# Patient Record
Sex: Female | Born: 1965
Health system: Southern US, Community
[De-identification: ages and names within clinical notes are randomized; demographics above are authoritative.]

## PROBLEM LIST (undated history)

## (undated) DIAGNOSIS — D259 Leiomyoma of uterus, unspecified: Secondary | ICD-10-CM

## (undated) DIAGNOSIS — Z800 Family history of malignant neoplasm of digestive organs: Secondary | ICD-10-CM

## (undated) DIAGNOSIS — F1011 Alcohol abuse, in remission: Secondary | ICD-10-CM

## (undated) DIAGNOSIS — M1990 Unspecified osteoarthritis, unspecified site: Secondary | ICD-10-CM

## (undated) DIAGNOSIS — Z9851 Tubal ligation status: Secondary | ICD-10-CM

## (undated) DIAGNOSIS — T7422XA Child sexual abuse, confirmed, initial encounter: Secondary | ICD-10-CM

## (undated) DIAGNOSIS — K2270 Barrett's esophagus without dysplasia: Secondary | ICD-10-CM

## (undated) DIAGNOSIS — M549 Dorsalgia, unspecified: Secondary | ICD-10-CM

## (undated) DIAGNOSIS — C541 Malignant neoplasm of endometrium: Secondary | ICD-10-CM

## (undated) DIAGNOSIS — I10 Essential (primary) hypertension: Secondary | ICD-10-CM

## (undated) DIAGNOSIS — M2550 Pain in unspecified joint: Secondary | ICD-10-CM

## (undated) DIAGNOSIS — E1140 Type 2 diabetes mellitus with diabetic neuropathy, unspecified: Secondary | ICD-10-CM

## (undated) DIAGNOSIS — F329 Major depressive disorder, single episode, unspecified: Secondary | ICD-10-CM

## (undated) DIAGNOSIS — S5290XA Unspecified fracture of unspecified forearm, initial encounter for closed fracture: Secondary | ICD-10-CM

## (undated) DIAGNOSIS — C801 Malignant (primary) neoplasm, unspecified: Secondary | ICD-10-CM

## (undated) DIAGNOSIS — D126 Benign neoplasm of colon, unspecified: Secondary | ICD-10-CM

## (undated) DIAGNOSIS — Z8042 Family history of malignant neoplasm of prostate: Secondary | ICD-10-CM

## (undated) DIAGNOSIS — Z803 Family history of malignant neoplasm of breast: Secondary | ICD-10-CM

## (undated) DIAGNOSIS — K3184 Gastroparesis: Secondary | ICD-10-CM

## (undated) DIAGNOSIS — F419 Anxiety disorder, unspecified: Secondary | ICD-10-CM

## (undated) DIAGNOSIS — R200 Anesthesia of skin: Secondary | ICD-10-CM

## (undated) DIAGNOSIS — Z87442 Personal history of urinary calculi: Secondary | ICD-10-CM

## (undated) DIAGNOSIS — F1010 Alcohol abuse, uncomplicated: Secondary | ICD-10-CM

## (undated) DIAGNOSIS — G4730 Sleep apnea, unspecified: Secondary | ICD-10-CM

## (undated) DIAGNOSIS — I700 Atherosclerosis of aorta: Secondary | ICD-10-CM

## (undated) DIAGNOSIS — J45909 Unspecified asthma, uncomplicated: Secondary | ICD-10-CM

## (undated) DIAGNOSIS — K760 Fatty (change of) liver, not elsewhere classified: Secondary | ICD-10-CM

## (undated) DIAGNOSIS — F1990 Other psychoactive substance use, unspecified, uncomplicated: Secondary | ICD-10-CM

## (undated) DIAGNOSIS — I517 Cardiomegaly: Secondary | ICD-10-CM

## (undated) DIAGNOSIS — E785 Hyperlipidemia, unspecified: Secondary | ICD-10-CM

## (undated) DIAGNOSIS — F32A Depression, unspecified: Secondary | ICD-10-CM

## (undated) DIAGNOSIS — Z860100 Personal history of colon polyps, unspecified: Secondary | ICD-10-CM

## (undated) DIAGNOSIS — R202 Paresthesia of skin: Secondary | ICD-10-CM

## (undated) DIAGNOSIS — K869 Disease of pancreas, unspecified: Secondary | ICD-10-CM

## (undated) DIAGNOSIS — K449 Diaphragmatic hernia without obstruction or gangrene: Secondary | ICD-10-CM

## (undated) DIAGNOSIS — N950 Postmenopausal bleeding: Secondary | ICD-10-CM

## (undated) DIAGNOSIS — E669 Obesity, unspecified: Secondary | ICD-10-CM

## (undated) DIAGNOSIS — K648 Other hemorrhoids: Secondary | ICD-10-CM

## (undated) DIAGNOSIS — S8292XA Unspecified fracture of left lower leg, initial encounter for closed fracture: Secondary | ICD-10-CM

## (undated) DIAGNOSIS — K219 Gastro-esophageal reflux disease without esophagitis: Secondary | ICD-10-CM

## (undated) DIAGNOSIS — T7840XA Allergy, unspecified, initial encounter: Secondary | ICD-10-CM

## (undated) DIAGNOSIS — A048 Other specified bacterial intestinal infections: Secondary | ICD-10-CM

## (undated) HISTORY — DX: Pain in unspecified joint: M25.50

## (undated) HISTORY — PX: CHOLECYSTECTOMY: SHX55

## (undated) HISTORY — DX: Unspecified osteoarthritis, unspecified site: M19.90

## (undated) HISTORY — DX: Allergy, unspecified, initial encounter: T78.40XA

## (undated) HISTORY — DX: Other psychoactive substance use, unspecified, uncomplicated: F19.90

## (undated) HISTORY — DX: Family history of malignant neoplasm of breast: Z80.3

## (undated) HISTORY — DX: Family history of malignant neoplasm of prostate: Z80.42

## (undated) HISTORY — DX: Benign neoplasm of colon, unspecified: D12.6

## (undated) HISTORY — PX: ABDOMINAL HYSTERECTOMY: SHX81

## (undated) HISTORY — DX: Diaphragmatic hernia without obstruction or gangrene: K44.9

## (undated) HISTORY — DX: Alcohol abuse, uncomplicated: F10.10

## (undated) HISTORY — DX: Dorsalgia, unspecified: M54.9

## (undated) HISTORY — DX: Sleep apnea, unspecified: G47.30

## (undated) HISTORY — DX: Atherosclerosis of aorta: I70.0

## (undated) HISTORY — DX: Malignant (primary) neoplasm, unspecified: C80.1

## (undated) HISTORY — DX: Disease of pancreas, unspecified: K86.9

## (undated) HISTORY — DX: Unspecified asthma, uncomplicated: J45.909

## (undated) HISTORY — DX: Family history of malignant neoplasm of digestive organs: Z80.0

## (undated) HISTORY — DX: Other hemorrhoids: K64.8

## (undated) HISTORY — DX: Fatty (change of) liver, not elsewhere classified: K76.0

## (undated) HISTORY — DX: Essential (primary) hypertension: I10

## (undated) HISTORY — DX: Other specified bacterial intestinal infections: A04.8

## (undated) HISTORY — DX: Obesity, unspecified: E66.9

## (undated) HISTORY — DX: Barrett's esophagus without dysplasia: K22.70

## (undated) HISTORY — DX: Tubal ligation status: Z98.51

## (undated) HISTORY — DX: Hyperlipidemia, unspecified: E78.5

## (undated) HISTORY — PX: TUBAL LIGATION: SHX77

---
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
## 1997-07-06 ENCOUNTER — Emergency Department (HOSPITAL_COMMUNITY): Admission: EM | Admit: 1997-07-06 | Discharge: 1997-07-06 | Payer: Self-pay | Admitting: Emergency Medicine

## 1997-07-28 ENCOUNTER — Emergency Department (HOSPITAL_COMMUNITY): Admission: EM | Admit: 1997-07-28 | Discharge: 1997-07-28 | Payer: Self-pay | Admitting: Emergency Medicine

## 1997-11-24 ENCOUNTER — Encounter: Payer: Self-pay | Admitting: Emergency Medicine

## 1997-11-24 ENCOUNTER — Emergency Department (HOSPITAL_COMMUNITY): Admission: EM | Admit: 1997-11-24 | Discharge: 1997-11-24 | Payer: Self-pay | Admitting: Emergency Medicine

## 1998-06-04 ENCOUNTER — Other Ambulatory Visit: Admission: RE | Admit: 1998-06-04 | Discharge: 1998-06-04 | Payer: Self-pay | Admitting: Family Medicine

## 1998-08-02 ENCOUNTER — Emergency Department (HOSPITAL_COMMUNITY): Admission: EM | Admit: 1998-08-02 | Discharge: 1998-08-02 | Payer: Self-pay | Admitting: Emergency Medicine

## 1998-08-03 ENCOUNTER — Encounter: Admission: RE | Admit: 1998-08-03 | Discharge: 1998-08-03 | Payer: Self-pay | Admitting: Obstetrics

## 1998-08-03 ENCOUNTER — Other Ambulatory Visit: Admission: RE | Admit: 1998-08-03 | Discharge: 1998-08-03 | Payer: Self-pay | Admitting: Obstetrics

## 1998-08-11 ENCOUNTER — Ambulatory Visit (HOSPITAL_COMMUNITY): Admission: RE | Admit: 1998-08-11 | Discharge: 1998-08-11 | Payer: Self-pay

## 1998-10-05 ENCOUNTER — Other Ambulatory Visit: Admission: RE | Admit: 1998-10-05 | Discharge: 1998-10-05 | Payer: Self-pay | Admitting: Obstetrics & Gynecology

## 1998-10-05 ENCOUNTER — Encounter: Admission: RE | Admit: 1998-10-05 | Discharge: 1998-10-05 | Payer: Self-pay | Admitting: Obstetrics & Gynecology

## 1999-01-08 ENCOUNTER — Emergency Department (HOSPITAL_COMMUNITY): Admission: EM | Admit: 1999-01-08 | Discharge: 1999-01-08 | Payer: Self-pay | Admitting: Emergency Medicine

## 1999-01-08 ENCOUNTER — Encounter: Payer: Self-pay | Admitting: Emergency Medicine

## 1999-03-01 ENCOUNTER — Encounter: Payer: Self-pay | Admitting: Emergency Medicine

## 1999-03-01 ENCOUNTER — Emergency Department (HOSPITAL_COMMUNITY): Admission: EM | Admit: 1999-03-01 | Discharge: 1999-03-01 | Payer: Self-pay | Admitting: Emergency Medicine

## 1999-03-15 ENCOUNTER — Emergency Department (HOSPITAL_COMMUNITY): Admission: EM | Admit: 1999-03-15 | Discharge: 1999-03-15 | Payer: Self-pay | Admitting: Emergency Medicine

## 1999-06-28 ENCOUNTER — Inpatient Hospital Stay (HOSPITAL_COMMUNITY): Admission: EM | Admit: 1999-06-28 | Discharge: 1999-07-01 | Payer: Self-pay | Admitting: *Deleted

## 1999-07-15 ENCOUNTER — Encounter: Payer: Self-pay | Admitting: *Deleted

## 1999-07-15 ENCOUNTER — Inpatient Hospital Stay (HOSPITAL_COMMUNITY): Admission: EM | Admit: 1999-07-15 | Discharge: 1999-07-16 | Payer: Self-pay | Admitting: Emergency Medicine

## 1999-07-16 ENCOUNTER — Inpatient Hospital Stay (HOSPITAL_COMMUNITY): Admission: EM | Admit: 1999-07-16 | Discharge: 1999-07-19 | Payer: Self-pay | Admitting: Psychiatry

## 1999-08-11 ENCOUNTER — Inpatient Hospital Stay (HOSPITAL_COMMUNITY): Admission: EM | Admit: 1999-08-11 | Discharge: 1999-08-15 | Payer: Self-pay | Admitting: Psychiatry

## 1999-11-18 ENCOUNTER — Encounter: Admission: RE | Admit: 1999-11-18 | Discharge: 2000-02-16 | Payer: Self-pay

## 1999-12-02 ENCOUNTER — Emergency Department (HOSPITAL_COMMUNITY): Admission: EM | Admit: 1999-12-02 | Discharge: 1999-12-03 | Payer: Self-pay | Admitting: Emergency Medicine

## 1999-12-03 ENCOUNTER — Encounter: Payer: Self-pay | Admitting: Emergency Medicine

## 2000-03-07 ENCOUNTER — Encounter: Admission: RE | Admit: 2000-03-07 | Discharge: 2000-06-05 | Payer: Self-pay

## 2000-03-30 ENCOUNTER — Emergency Department (HOSPITAL_COMMUNITY): Admission: EM | Admit: 2000-03-30 | Discharge: 2000-03-30 | Payer: Self-pay | Admitting: Emergency Medicine

## 2000-05-03 ENCOUNTER — Emergency Department (HOSPITAL_COMMUNITY): Admission: EM | Admit: 2000-05-03 | Discharge: 2000-05-03 | Payer: Self-pay | Admitting: Emergency Medicine

## 2000-05-13 ENCOUNTER — Emergency Department (HOSPITAL_COMMUNITY): Admission: EM | Admit: 2000-05-13 | Discharge: 2000-05-14 | Payer: Self-pay | Admitting: Emergency Medicine

## 2000-07-05 ENCOUNTER — Other Ambulatory Visit: Admission: RE | Admit: 2000-07-05 | Discharge: 2000-07-05 | Payer: Self-pay | Admitting: Obstetrics

## 2000-07-05 ENCOUNTER — Encounter: Admission: RE | Admit: 2000-07-05 | Discharge: 2000-07-05 | Payer: Self-pay | Admitting: Obstetrics

## 2000-07-19 ENCOUNTER — Encounter: Admission: RE | Admit: 2000-07-19 | Discharge: 2000-07-19 | Payer: Self-pay | Admitting: Obstetrics & Gynecology

## 2000-09-11 ENCOUNTER — Emergency Department (HOSPITAL_COMMUNITY): Admission: EM | Admit: 2000-09-11 | Discharge: 2000-09-11 | Payer: Self-pay | Admitting: Emergency Medicine

## 2001-05-20 ENCOUNTER — Ambulatory Visit (HOSPITAL_COMMUNITY): Admission: RE | Admit: 2001-05-20 | Discharge: 2001-05-20 | Payer: Self-pay | Admitting: Internal Medicine

## 2001-05-20 ENCOUNTER — Encounter: Payer: Self-pay | Admitting: Internal Medicine

## 2001-06-03 ENCOUNTER — Encounter: Payer: Self-pay | Admitting: General Surgery

## 2001-06-04 ENCOUNTER — Encounter: Payer: Self-pay | Admitting: General Surgery

## 2001-06-04 ENCOUNTER — Ambulatory Visit (HOSPITAL_COMMUNITY): Admission: RE | Admit: 2001-06-04 | Discharge: 2001-06-05 | Payer: Self-pay | Admitting: General Surgery

## 2001-06-04 ENCOUNTER — Encounter (INDEPENDENT_AMBULATORY_CARE_PROVIDER_SITE_OTHER): Payer: Self-pay | Admitting: *Deleted

## 2002-04-24 ENCOUNTER — Ambulatory Visit (HOSPITAL_COMMUNITY): Admission: RE | Admit: 2002-04-24 | Discharge: 2002-04-24 | Payer: Self-pay | Admitting: Internal Medicine

## 2002-04-24 ENCOUNTER — Encounter: Payer: Self-pay | Admitting: Internal Medicine

## 2002-05-08 ENCOUNTER — Emergency Department (HOSPITAL_COMMUNITY): Admission: EM | Admit: 2002-05-08 | Discharge: 2002-05-08 | Payer: Self-pay | Admitting: Emergency Medicine

## 2002-05-26 ENCOUNTER — Emergency Department (HOSPITAL_COMMUNITY): Admission: EM | Admit: 2002-05-26 | Discharge: 2002-05-26 | Payer: Self-pay

## 2002-08-21 ENCOUNTER — Emergency Department (HOSPITAL_COMMUNITY): Admission: EM | Admit: 2002-08-21 | Discharge: 2002-08-21 | Payer: Self-pay | Admitting: Emergency Medicine

## 2002-08-21 ENCOUNTER — Encounter: Payer: Self-pay | Admitting: Emergency Medicine

## 2002-08-25 ENCOUNTER — Emergency Department (HOSPITAL_COMMUNITY): Admission: EM | Admit: 2002-08-25 | Discharge: 2002-08-25 | Payer: Self-pay | Admitting: *Deleted

## 2002-10-19 ENCOUNTER — Emergency Department (HOSPITAL_COMMUNITY): Admission: EM | Admit: 2002-10-19 | Discharge: 2002-10-19 | Payer: Self-pay

## 2002-10-30 ENCOUNTER — Emergency Department (HOSPITAL_COMMUNITY): Admission: EM | Admit: 2002-10-30 | Discharge: 2002-10-30 | Payer: Self-pay | Admitting: Emergency Medicine

## 2002-11-06 ENCOUNTER — Emergency Department (HOSPITAL_COMMUNITY): Admission: AD | Admit: 2002-11-06 | Discharge: 2002-11-06 | Payer: Self-pay | Admitting: Family Medicine

## 2002-12-19 ENCOUNTER — Emergency Department (HOSPITAL_COMMUNITY): Admission: EM | Admit: 2002-12-19 | Discharge: 2002-12-19 | Payer: Self-pay | Admitting: Emergency Medicine

## 2003-01-10 ENCOUNTER — Emergency Department (HOSPITAL_COMMUNITY): Admission: EM | Admit: 2003-01-10 | Discharge: 2003-01-10 | Payer: Self-pay

## 2003-02-26 ENCOUNTER — Emergency Department (HOSPITAL_COMMUNITY): Admission: EM | Admit: 2003-02-26 | Discharge: 2003-02-26 | Payer: Self-pay | Admitting: Family Medicine

## 2003-03-11 ENCOUNTER — Emergency Department (HOSPITAL_COMMUNITY): Admission: AD | Admit: 2003-03-11 | Discharge: 2003-03-11 | Payer: Self-pay | Admitting: Family Medicine

## 2003-03-29 ENCOUNTER — Inpatient Hospital Stay (HOSPITAL_COMMUNITY): Admission: EM | Admit: 2003-03-29 | Discharge: 2003-03-31 | Payer: Self-pay

## 2003-03-31 ENCOUNTER — Inpatient Hospital Stay (HOSPITAL_COMMUNITY): Admission: RE | Admit: 2003-03-31 | Discharge: 2003-04-04 | Payer: Self-pay | Admitting: Psychiatry

## 2003-09-15 ENCOUNTER — Ambulatory Visit (HOSPITAL_COMMUNITY): Admission: RE | Admit: 2003-09-15 | Discharge: 2003-09-15 | Payer: Self-pay | Admitting: Internal Medicine

## 2003-09-15 ENCOUNTER — Ambulatory Visit: Payer: Self-pay | Admitting: Family Medicine

## 2003-11-13 ENCOUNTER — Emergency Department (HOSPITAL_COMMUNITY): Admission: EM | Admit: 2003-11-13 | Discharge: 2003-11-13 | Payer: Self-pay | Admitting: Emergency Medicine

## 2004-01-02 ENCOUNTER — Emergency Department (HOSPITAL_COMMUNITY): Admission: EM | Admit: 2004-01-02 | Discharge: 2004-01-03 | Payer: Self-pay | Admitting: Emergency Medicine

## 2004-03-08 ENCOUNTER — Ambulatory Visit: Payer: Self-pay | Admitting: Nurse Practitioner

## 2004-04-18 ENCOUNTER — Ambulatory Visit: Payer: Self-pay | Admitting: Family Medicine

## 2004-04-25 ENCOUNTER — Ambulatory Visit: Payer: Self-pay | Admitting: Family Medicine

## 2004-05-16 ENCOUNTER — Ambulatory Visit: Payer: Self-pay | Admitting: Family Medicine

## 2004-05-25 ENCOUNTER — Ambulatory Visit: Payer: Self-pay | Admitting: Family Medicine

## 2004-08-02 ENCOUNTER — Ambulatory Visit: Payer: Self-pay | Admitting: Family Medicine

## 2004-08-31 ENCOUNTER — Ambulatory Visit: Payer: Self-pay | Admitting: Family Medicine

## 2004-08-31 ENCOUNTER — Other Ambulatory Visit: Admission: RE | Admit: 2004-08-31 | Discharge: 2004-08-31 | Payer: Self-pay | Admitting: Internal Medicine

## 2004-10-17 ENCOUNTER — Ambulatory Visit: Payer: Self-pay | Admitting: Family Medicine

## 2004-10-29 ENCOUNTER — Emergency Department (HOSPITAL_COMMUNITY): Admission: EM | Admit: 2004-10-29 | Discharge: 2004-10-29 | Payer: Self-pay | Admitting: Family Medicine

## 2004-11-10 ENCOUNTER — Ambulatory Visit: Payer: Self-pay | Admitting: Family Medicine

## 2004-12-29 ENCOUNTER — Ambulatory Visit: Payer: Self-pay | Admitting: Family Medicine

## 2005-01-24 ENCOUNTER — Ambulatory Visit: Payer: Self-pay | Admitting: Internal Medicine

## 2005-01-30 ENCOUNTER — Ambulatory Visit: Payer: Self-pay | Admitting: Family Medicine

## 2006-01-10 ENCOUNTER — Emergency Department (HOSPITAL_COMMUNITY): Admission: EM | Admit: 2006-01-10 | Discharge: 2006-01-10 | Payer: Self-pay | Admitting: Family Medicine

## 2006-06-08 ENCOUNTER — Ambulatory Visit (HOSPITAL_COMMUNITY): Admission: RE | Admit: 2006-06-08 | Discharge: 2006-06-08 | Payer: Self-pay | Admitting: Family Medicine

## 2006-06-21 ENCOUNTER — Emergency Department (HOSPITAL_COMMUNITY): Admission: EM | Admit: 2006-06-21 | Discharge: 2006-06-21 | Payer: Self-pay | Admitting: Emergency Medicine

## 2006-06-25 ENCOUNTER — Emergency Department (HOSPITAL_COMMUNITY): Admission: EM | Admit: 2006-06-25 | Discharge: 2006-06-25 | Payer: Self-pay | Admitting: Emergency Medicine

## 2006-07-11 ENCOUNTER — Ambulatory Visit: Payer: Self-pay | Admitting: Obstetrics & Gynecology

## 2006-07-11 ENCOUNTER — Encounter: Payer: Self-pay | Admitting: Obstetrics & Gynecology

## 2006-07-17 ENCOUNTER — Emergency Department (HOSPITAL_COMMUNITY): Admission: EM | Admit: 2006-07-17 | Discharge: 2006-07-18 | Payer: Self-pay | Admitting: Emergency Medicine

## 2006-09-05 ENCOUNTER — Emergency Department (HOSPITAL_COMMUNITY): Admission: EM | Admit: 2006-09-05 | Discharge: 2006-09-05 | Payer: Self-pay | Admitting: Emergency Medicine

## 2006-12-11 ENCOUNTER — Emergency Department (HOSPITAL_COMMUNITY): Admission: EM | Admit: 2006-12-11 | Discharge: 2006-12-11 | Payer: Self-pay | Admitting: Emergency Medicine

## 2006-12-19 ENCOUNTER — Emergency Department (HOSPITAL_COMMUNITY): Admission: EM | Admit: 2006-12-19 | Discharge: 2006-12-19 | Payer: Self-pay | Admitting: Family Medicine

## 2007-02-09 ENCOUNTER — Emergency Department (HOSPITAL_COMMUNITY): Admission: EM | Admit: 2007-02-09 | Discharge: 2007-02-09 | Payer: Self-pay | Admitting: Emergency Medicine

## 2007-05-20 ENCOUNTER — Ambulatory Visit: Payer: Self-pay | Admitting: Internal Medicine

## 2007-05-29 ENCOUNTER — Emergency Department (HOSPITAL_COMMUNITY): Admission: EM | Admit: 2007-05-29 | Discharge: 2007-05-29 | Payer: Self-pay | Admitting: Family Medicine

## 2007-08-19 ENCOUNTER — Emergency Department (HOSPITAL_COMMUNITY): Admission: EM | Admit: 2007-08-19 | Discharge: 2007-08-19 | Payer: Self-pay | Admitting: Emergency Medicine

## 2008-07-02 ENCOUNTER — Emergency Department (HOSPITAL_COMMUNITY): Admission: EM | Admit: 2008-07-02 | Discharge: 2008-07-02 | Payer: Self-pay | Admitting: Emergency Medicine

## 2008-08-01 ENCOUNTER — Emergency Department (HOSPITAL_COMMUNITY): Admission: EM | Admit: 2008-08-01 | Discharge: 2008-08-01 | Payer: Self-pay | Admitting: Emergency Medicine

## 2008-08-09 ENCOUNTER — Emergency Department (HOSPITAL_COMMUNITY): Admission: EM | Admit: 2008-08-09 | Discharge: 2008-08-09 | Payer: Self-pay | Admitting: Emergency Medicine

## 2009-08-19 ENCOUNTER — Ambulatory Visit (HOSPITAL_COMMUNITY): Admission: RE | Admit: 2009-08-19 | Discharge: 2009-08-19 | Payer: Self-pay | Admitting: Internal Medicine

## 2009-08-19 ENCOUNTER — Emergency Department (HOSPITAL_COMMUNITY): Admission: EM | Admit: 2009-08-19 | Discharge: 2009-08-20 | Payer: Self-pay | Admitting: Emergency Medicine

## 2009-10-02 ENCOUNTER — Emergency Department (HOSPITAL_COMMUNITY): Admission: EM | Admit: 2009-10-02 | Discharge: 2009-10-02 | Payer: Self-pay | Admitting: Family Medicine

## 2010-01-31 ENCOUNTER — Encounter: Payer: Self-pay | Admitting: Internal Medicine

## 2010-03-24 LAB — POCT RAPID STREP A (OFFICE): Streptococcus, Group A Screen (Direct): POSITIVE — AB

## 2010-04-18 LAB — CARBOXYHEMOGLOBIN
Carboxyhemoglobin: 1.3 % (ref 0.5–1.5)
O2 Saturation: 63.4 %
Total hemoglobin: 10.3 g/dL — ABNORMAL LOW (ref 12.5–16.0)

## 2010-05-09 ENCOUNTER — Emergency Department (HOSPITAL_COMMUNITY)
Admission: EM | Admit: 2010-05-09 | Discharge: 2010-05-10 | Disposition: A | Discharge status: Home / Self Care | Payer: Medicaid Other | Attending: Emergency Medicine | Admitting: Emergency Medicine

## 2010-05-09 DIAGNOSIS — Z79899 Other long term (current) drug therapy: Secondary | ICD-10-CM | POA: Insufficient documentation

## 2010-05-09 DIAGNOSIS — E669 Obesity, unspecified: Secondary | ICD-10-CM | POA: Insufficient documentation

## 2010-05-09 DIAGNOSIS — J45909 Unspecified asthma, uncomplicated: Secondary | ICD-10-CM | POA: Insufficient documentation

## 2010-05-09 DIAGNOSIS — J039 Acute tonsillitis, unspecified: Secondary | ICD-10-CM | POA: Insufficient documentation

## 2010-05-09 DIAGNOSIS — J351 Hypertrophy of tonsils: Secondary | ICD-10-CM | POA: Insufficient documentation

## 2010-05-09 DIAGNOSIS — F329 Major depressive disorder, single episode, unspecified: Secondary | ICD-10-CM | POA: Insufficient documentation

## 2010-05-09 DIAGNOSIS — F3289 Other specified depressive episodes: Secondary | ICD-10-CM | POA: Insufficient documentation

## 2010-05-09 DIAGNOSIS — R070 Pain in throat: Secondary | ICD-10-CM | POA: Insufficient documentation

## 2010-05-09 DIAGNOSIS — M1990 Unspecified osteoarthritis, unspecified site: Secondary | ICD-10-CM | POA: Insufficient documentation

## 2010-05-09 LAB — POCT I-STAT, CHEM 8
BUN: 5 mg/dL — ABNORMAL LOW (ref 6–23)
Calcium, Ion: 1.05 mmol/L — ABNORMAL LOW (ref 1.12–1.32)
Chloride: 102 mEq/L (ref 96–112)
Creatinine, Ser: 1 mg/dL (ref 0.4–1.2)
Glucose, Bld: 126 mg/dL — ABNORMAL HIGH (ref 70–99)
HCT: 39 % (ref 36.0–46.0)
Hemoglobin: 13.3 g/dL (ref 12.0–15.0)
Potassium: 3.4 meq/L — ABNORMAL LOW (ref 3.5–5.1)
Sodium: 138 meq/L (ref 135–145)
TCO2: 27 mmol/L (ref 0–100)

## 2010-05-10 LAB — URINALYSIS, ROUTINE W REFLEX MICROSCOPIC
Bilirubin Urine: NEGATIVE
Glucose, UA: NEGATIVE mg/dL
Ketones, ur: NEGATIVE mg/dL
Leukocytes, UA: NEGATIVE
Nitrite: NEGATIVE
Protein, ur: NEGATIVE mg/dL
Specific Gravity, Urine: 1.012 (ref 1.005–1.030)
Urobilinogen, UA: 1 mg/dL (ref 0.0–1.0)
pH: 7.5 (ref 5.0–8.0)

## 2010-05-10 LAB — URINE MICROSCOPIC-ADD ON
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
## 2010-05-27 NOTE — Op Note (Signed)
[place]  Patient:    Tara, Keller Visit Number: [id number] MRN: [id number]          Service Type: DSU Location: [phone] Attending Physician:  Henrene Dodge Dictated by:   Anselm Pancoast. Zachery Dakins, M.D. Proc. Date: 06/04/01 Admit Date:  06/04/2001 Discharge Date: 06/05/2001                             Operative Report  PREOPERATIVE DIAGNOSES: 1. Chronic cholecystitis with stones. 2. Obesity.  POSTOPERATIVE DIAGNOSIS:  Chronic cholecystitis with multiple stones.  OPERATION PERFORMED:  Cholecystectomy with cholangiogram.  SURGEON:  Anselm Pancoast. Zachery Dakins, M.D.  ASSISTANT:  Gita Kudo, M.D.  ANESTHESIA:  General.  INDICATIONS FOR PROCEDURE:  The patient is a 45 year old, very heavy female whom I saw in the office on Friday.  For about a month, she has had recurring episodes of upper abdominal pain.  She had an ultrasound that showed multiple stones.  She had been seen several times for this pain, was not acutely ill or febrile and I added her to the operating room schedule for today.  She said had two or three little episodes of pain over the weekend but no nausea, vomiting or problems.  DESCRIPTION OF PROCEDURE:  Preoperatively, she had PAS stockings.  She was positioned on the operating room table.  Induction of general anesthesia, endotracheal tube.  Her abdomen was very large and was prepped with Betadine surgical scrub and solution and draped in a sterile manner.  A small incision was made below the umbilicus.  Appendiceal retractors were used to identify the fascia.  This was picked up between two Kochers and a small opening was made. I then carefully opened into the peritoneal cavity.  Traction sutures of 0 Vicryl were placed superiorly and inferiorly and the Hasson camera introduced.  A camera was inserted.  Carbon dioxide turned on and the upper 10 mm trocar was placed in the subxiphoid area under direct  vision after anesthetizing the fascia.  Dr. Maryagnes Amos placed the two lateral 5 mm ports.  The gallbladder was significantly thickened and packed with stones and not acutely inflamed.  There was a stone impacted in the proximal portion and the traction upward and lateral, we could fortunately identify the cystic duct area nicely. The cystic artery the anterior branch was doubly clipped proximally, singly distally and divided and this sort of opened the view to the junction of the gallbladder and the cystic duct.  This was encompassed with a right angle and a clip was placed at the junction and then a small opening made in the cystic duct.  A Cook catheter introduced and the x-ray obtained.  Good prompt filling of the various normal small sized extrahepatic biliary tree and good flow into the duodenum.  The catheter was removed.  The cystic duct was triply clipped and then divided.  There was a posterior branch of the cystic artery that was identified and this was doubly clipped and then we freed the gallbladder from its bed using the hook and spatula with electrocautery.  Good hemostasis was obtained.  It was necessary to place a clip more lateral from a bleeder right at the edge of the peritoneal edge of the gallbladder bed.  The gallbladder was then placed in an Endocatch bag and the camera withdrawn and placed through the upper 10 mm port.  She had a little bit of a hernia  at the umbilicus so omentum caught there and Hasson cannula had kind of gone through it.  We peeled this away carefully. There was a little area that possibly would have bled and we were able to visualize this and hemoclip it and then brought the bag containing the gallbladder up to the fascia.  We opened the fascia slightly, then could bring the gallbladder neck into the skin level, open the bag and the gallbladder and remove the numerous stones, we could then bring it on through the fascial defect without enlarging it  further.   I then closed the fascial defect with two figure-of-eights of 0 Vicryl plus the stitches already in place and these were all tied and the fascia anesthetized.  Reinspection, the irrigating fluid had been aspirated.  There was no evidence of any bleeding.  Looking down at the umbilicus it looks like we got a good closure with no bleeding and we then released the carbon dioxide removing the 5 mm ports.  I then withdrew the upper 10 mm trocar under direct vision.  The subcutaneous wounds closed with 4-0 Vicryl and benzoin and Steri-Strips on the skin.  The patient tolerated the procedure nicely and was taken to the recovery room extubated in stable postoperative condition.  She should be ready for discharge in the morning. Dictated by:   Anselm Pancoast. Zachery Dakins, M.D. Attending Physician:  Henrene Dodge DD:  06/04/01 TD:  06/05/01 Job: [id number]
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
## 2010-05-27 NOTE — H&P (Signed)
[place]  Patient:    Tara Keller, Tara Keller                       MRN: [id number] Attending:  Barbette Hair. Vaughan Basta., M.D.                         History and Physical  DATE OF BIRTH: [birth year]  CHIEF COMPLAINT: The patient attempted to commit suicide.  HISTORY OF PRESENT ILLNESS: Tara Keller is a 45 year old black female who has a history of major depression, followed at mental health, and also has a history of asthma, followed at [facility].  Apparently this evening after having an argument with her daughter she took approximately #10 Ambien as best we can determine.  It does not appear she took any other medication.  The patient currently is not awake in order to help [name] answer these questions.  Drug screen is currently pending.  CURRENT MEDICATIONS:  1. Prozac, dose unknown.  2. Neurontin, dose unknown.  3. BuSpar, dose unknown.  4. Halcion, dose unknown.  Her medications were not brought with her.  ALLERGIES: No known drug allergies.  SOCIAL HISTORY: The patient is single and has four children.  She is disabled. She smokes half a pack of cigarettes per day and does not drink alcohol. However, on a recent visit to the emergency department in June 2001 cocaine was positive on her drug screen.  FAMILY HISTORY: Unobtainable at present.  PHYSICAL EXAMINATION:  VITAL SIGNS: Blood pressure 94/42, pulse 99, respiratory rate less than 20. She is afebrile.  LUNGS: Clear to auscultation bilaterally.  HEART: Regular rate and rhythm without murmurs, rubs, or gallops.  ABDOMEN: Soft, nontender, nondistended.  Positive bowel sounds.  No hepatosplenomegaly.  EXTREMITIES: No clubbing, cyanosis, or edema.  LABORATORY DATA: CBC showed a WBC of 5.7, hemoglobin 10.8, hematocrit 33.8; platelet count 332,000.  Basic metabolic panel shows a sodium of 133, potassium 2.8, chloride 104, CO2 25, BUN 13, creatinine 0.7, glucose 100. Drug screen  pending.  Tylenol and aspirin levels negative.  ASSESSMENT/PLAN:  1. Suicidal attempt with Halcion overdose, approximately #10 pills.  She     needs monitoring overnight with observation.  She will need psychiatric     clearance prior to discharge home.  2. Depression.  Hold current medications at present due to somnolence.  3. Probable obstructive sleep apnea based on her sedated evaluation at     present.  She probably will need outpatient evaluation for that.  4. Hypokalemia.  Will replete and recheck in the morning.  5. Polysubstance abuse.  Drug screen is still pending.  6. Asthma.  Will cover her with albuterol and Atrovent nebulizer inhalers. DD:  07/16/99 TD:  07/18/99 Job: [id number]
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
## 2010-05-27 NOTE — Discharge Summary (Signed)
[facility]  Patient:    Tara Keller, Tara Keller                        MRN: [id number] Adm. Date:  [id number] Disc. Date: [id number] Attending:  Marlyn Corporal Fabmy                           Discharge Summary  ADMISSION DIAGNOSES: Axis I:    1. Major depression, recurrent, with suicidal and homicidal ideas.            2. Cocaine dependence. Axis II:   Personality disorder, not otherwise specified. Axis III:  1. Bronchial asthma.            2. Status post cancer of the cervix. Axis IV:   Severe, related to custody issues. Axis V:    Global assessment of functioning 30 on admission and 50-55 during            the last year.  DISCHARGE DIAGNOSES: Axis I:    1. Major depression, recurrent.            2. Cocaine dependence. Axis II:   Personality disorder, not otherwise specified. Axis III:  1. Bronchial asthma.            2. Status post cancer of the cervix. Axis IV:   Severe, related to custody issues. Axis V:    Global assessment of functioning 30 on admission and 55 on            discharge.  BRIEF HISTORY:  The patient is a 45 year old single African-American female admitted on a voluntary basis.  She was referred by [facility] after she went to court the day prior to her admission over the custody of her 78 year old daughter who was being charged with armed robbery and the daughters caseworker was there and she was very upset about some of the things that were said about her in court.  At the time of admission the patient was furious with the Department of Social Services because they refused to give her back custody of her children. She allegedly gave up custody of her children to DSS six months prior to admission, when one of her children, her 61 year old daughter, was found prostituting herself for drugs.  The patients understanding was that six months later she would be allowed back custody of her children on e at a time. When her  expectation was not met, the patient became extremely angry and verbalized homicidal threats towards various people in the Department of Social Services.  PHYSICAL EXAMINATION AND LABORATORY DATA:  A physical examination on admission was unremarkable.  The patients admission lab work included a urine drug screen which was positive for cocaine metabolites.  Urinalysis was unremarkable.  Other lab values are not in her chart t the time of this dictation.  HOSPITAL COURSE:  The patient rapidly stabilized following her admission. She was laced on a combination of Prozac, BuSpar, Neurontin and Seroquel with benefit.  She tolerated the medication well and without any side effects.  CONDITION ON DISCHARGE:  The patient was sleeping well.  She categorically denied continued suicidal or homicidal strivings.  Her affect was full and her mood was euthymic.  She denied any schneiderian criteria.  MEDICATIONS:  The patient was discharged with prescriptions for Prozac 20 mg q.d., BuSpar 15 mg b.i.d., Neurontin 800 mg b.i.d. and Seroquel 25 mg h.s.  FOLLOW-UP:  She was to follow at mental health that afternoon.  She was advised not to drink any alcoholic beverages or use cocaine. DD:  09/12/99 TD:  09/13/99 Job: [id number]

## 2010-05-27 NOTE — H&P (Signed)
Tara Keller, Tara Keller                           ACCOUNT NO.:  [id number]   MEDICAL RECORD NO.:  [id number]                   PATIENT TYPE:  INP   LOCATION:  0102                                 FACILITY:  [facility]   PHYSICIAN:  Deirdre Peer. Polite, M.D.              DATE OF BIRTH:  [birth year]   DATE OF ADMISSION:  03/29/2003  DATE OF DISCHARGE:                                HISTORY & PHYSICAL   CHIEF COMPLAINT:  Tired of life.   HISTORY OF PRESENT ILLNESS:  This is a 45 year old female with a known  history of asthma, obesity and significant depression with past suicide  attempts presents to ED via EMS after a suicide attempt at home.  The  patient was transported to the ED via EMS as stated hemodynamically stable.  The patient supposedly took approximately 40 pills, a combination of  Wellbutrin 150 mg, Prozac 20 mg and Neurontin (unknown milligram).  In the  ED the patient had screening labs, but has a negative drug screen, alcohol  less than 5, acetaminophen level less than 10 and negative urine drug  screen.  Salicylate level has not been done.  Poison control was contacted  and recommended the patient be watched for tremors and seizures related to  Wellbutrin, watch for CNS depression from Neurontin and also watch for EKG  abnormalities, i.e. prolonged QT.  The patient was also recommended to  receive charcoal.  The patient received this in the ED and [facility]  was called further evaluation and treatment.   At the time of my evaluation the patient is alert and oriented, and in no  apparent distress, still Tired of Life, secondary to unhappy situation at  home with her children.  Of note, the patient also wrote a death letter to a  person named ________, which I presumed is a husband.  Because of the  patient's suicide attempt medical hospitalization is warranted.   PAST MEDICAL HISTORY:  As stated above.   MEDICATIONS:  Medications on admission include Neurontin, Prozac  and  Wellbutrin.   SOCIAL HISTORY:  Social history is positive for tobacco, three to four packs  a day.  No alcohol.  Positive drugs; cocaine and crack cocaine, which the  last time she used was approximately two days.  The patient states that she  uses crack cocaine, approximately $40 worth every three days.   PAST SURGICAL HISTORY:  Past surgical history is significant for  cholecystectomy approximately one year ago.  Negative appendectomy.  Negative for hysterectomy.   ALLERGIES:  No known drug allergies.   FAMILY HISTORY:  Mother had a history of hypertension and depression.  Father is deceased secondary to cirrhosis of the liver.   REVIEW OF SYSTEMS:  Negative for headaches, dizziness, nausea or vomiting,  diarrhea and constipation.  No blood in stool.  No blood in urine.   PHYSICAL EXAMINATION:  GENERAL APPEARANCE:  The  patient is alert and  oriented times three.  VITAL SIGNS:  Temp 97.2, BP 106/51, pulse 77 and respiratory rate of 12.  Sat 100% on room air.  HEENT:  Anicteric sclerae.  No oral lesions.  NECK:  No nodes.  No JVD.  CHEST:  Chest is clear to auscultation bilaterally.  CARDIOVASCULAR:  Regular, S1 and S2.  No S3.  ABDOMEN:  Abdomen is obese with no hepatosplenomegaly appreciated.  EXTREMITIES:  Two plus pulses.  No edema.  NEUROLOGIC:  Neuro exam nonfocal.   LABORATORY DATA:  CBC within normal limits.  BMET within normal limits.  AST  and ALT within normal limits.  Bilirubin 0.6.  Alcohol level less than 5.  UA within normal limits.  Urine drug screen positive for cocaine, otherwise  negative.  Acetaminophen less than 10.  The patient's EKG was within normal  limits; no prolonged QT.   ASSESSMENT AND PLAN:  1. Suicide attempt post ingestion of Wellbutrin, Prozac and Neurontin.  2. Obesity.  3. Past medical history of severe depression.  4. History of suicide attempts before of just pulls in 1999 and tried to cut     her wrists at the age of 33.   The  patient warrants admission to the hospital and needs to be seen by a  psychiatrist for evaluation of inpatient evaluation.                                               Deirdre Peer. Polite, M.D.    RDP/MEDQ  D:  03/29/2003  T:  03/29/2003  Job:  [id number]
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
## 2010-05-27 NOTE — Discharge Summary (Signed)
[name], [name]                           ACCOUNT NO.:  [id number]   MEDICAL RECORD NO.:  [id number]                   Keller TYPE:  INP   LOCATION:  0349                                 FACILITY:  [facility]   PHYSICIAN:  Melissa L. Ladona Ridgel, MD               DATE OF BIRTH:  12-15-1965   DATE OF ADMISSION:  03/29/2003  DATE OF DISCHARGE:  03/31/2003                                 DISCHARGE SUMMARY   ADMISSION DIAGNOSES:  1. Drug overdose, suicide attempt.  2. History of asthma.  3. Obesity.  4. Previous suicide attempt.   DISCHARGE DIAGNOSES:  1. Depression.  2. Suicide attempt.  3. Asthma which is stable.   DISCHARGE MEDICATIONS:  Nicotine patch 21 mg once daily if she wishes to  continue this.  Her segments will be prescribed as per [facility].  Her asthma is currently under control and at this time is not requiring any  medications.   PAIN MANAGEMENT:  She has no pain management issues.   ACTIVITY:  Not restricted.   DIET:  Not restricted.   WOUND CARE:  Not applicable.   FOLLOWUP:  She is to follow up with her primary care physician to arrange a  pulmonary consult for outpatient sleep study to rule out sleep apnea which  was presented by her husband as a fear for her since she snores at night.   HISTORY OF PRESENT ILLNESS:  The Keller is a 45 year old African-American  female with a history of asthma, obesity, significant depression with past  suicide attempts.  She was brought into the emergency room by EMS after  drafting an elaborate suicide note and taking approximately 40 pills, a  combination of Wellbutrin, Prozac, and Neurontin.  In the emergency room,  she had a negative drug screen.  She had no alcohol on board.  Her Tylenol  level was negligible.  She was admitted to the telemetry floor for further  observation after receiving charcoal.  During the course of the  hospitalization, the Keller remained alert and oriented.  She did state  though she  was tired of life, and that she was having trouble at home with  her husband and family.  The Keller was started on a nicotine patch for  her tobacco abuse.  She was seen by her psychiatrist who arranged for an  inpatient stay at [facility].  She was successfully  transferred to the [facility] using the [facility] transport  system.   PHYSICAL EXAMINATION:  VITAL SIGNS:  On the day of discharge, vital signs  were stable with a temperature of 98.1, blood pressure of 115/69, pulse of  78, respirations of 20.  Her saturations generally remained 95 to 99% on  room air.  GENERAL:  She was in no acute distress, but did express feelings of  hopelessness.  HEENT:  Normocephalic, atraumatic.  Pupils equal, round, reactive to  light.  Extraocular movements were intact.  Mucous membranes are moist.  NECK:  Supple, no JVD, no lymph nodes.  ABDOMEN:  She is morbidly obese.  Abdomen is soft, nontender, nondistended.  CARDIOVASCULAR:  Regular rate and rhythm, positive S1 and S2, no S3 or S4.  EXTREMITIES:  2+ pulses with no edema.  NEUROLOGIC:  She is intact.  She had no evidence during the course of the  hospitalization for any cardiovascular abnormality.   LABORATORY DATA:  At the time of discharge revealed a BUN of 10, creatinine  of 0.8.  Sodium was 134.  As stated, her drug screen was negative for any  drugs of abuse.  Her pregnancy test was negative.  Her ethanol level as  stated was negative.  Her baseline hemoglobin and hematocrit are 11.7 and  35.  Platelets of 328.   CONDITION ON DISCHARGE:  Stable.                                               Melissa L. Ladona Ridgel, MD    MLT/MEDQ  D:  04/01/2003  T:  04/02/2003  Job:  [id number]   cc:   Geoffery Lyons, M.D.
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
## 2010-06-23 ENCOUNTER — Ambulatory Visit (HOSPITAL_BASED_OUTPATIENT_CLINIC_OR_DEPARTMENT_OTHER): Payer: Medicaid Other | Attending: Internal Medicine

## 2010-06-23 DIAGNOSIS — G4733 Obstructive sleep apnea (adult) (pediatric): Secondary | ICD-10-CM | POA: Insufficient documentation

## 2010-06-23 DIAGNOSIS — I4949 Other premature depolarization: Secondary | ICD-10-CM | POA: Insufficient documentation

## 2010-06-25 DIAGNOSIS — G4733 Obstructive sleep apnea (adult) (pediatric): Secondary | ICD-10-CM

## 2010-06-25 DIAGNOSIS — I4949 Other premature depolarization: Secondary | ICD-10-CM

## 2010-06-25 NOTE — Procedures (Signed)
Tara Keller, Tara Keller                 ACCOUNT NO.:  [id number]  MEDICAL RECORD NO.:  [id number]          PATIENT TYPE:  OUT  LOCATION:  SLEEP CENTER                 FACILITY:  [facility]  PHYSICIAN:  Clinton D. Maple Hudson, MD, FCCP, FACPDATE OF BIRTH:  1965/09/[birth year]  DATE OF STUDY:  06/23/2010                           NOCTURNAL POLYSOMNOGRAM  REFERRING PHYSICIAN:  EDWIN A AVBUERE  INDICATION FOR STUDY:  Hypersomnia with sleep apnea.  EPWORTH SLEEPINESS SCORE:  Epworth sleepiness score 4/24, BMI 58.4. Weight 362 pounds, height 66 inches.  Neck 15 inches.  Home medications are charted and reviewed.  MEDICATIONS:  SLEEP ARCHITECTURE:  Total sleep time 227.5 minutes with sleep efficiency 50.7%.  Stage I was 19.1%, stage II 61.3%, stage III 15.8%, REM 3.7% of total sleep time.  Sleep latency 86.5 minutes, REM latency 266 minutes, awake after sleep onset 63 minutes, arousal index 109.5. No bedtime medication was taken.  Sleep onset was shortly after midnight.  RESPIRATORY DATA:  Apnea/hypopnea index (AHI) 142.9 per hour.  A total of 526 events were scored including 374 obstructive apneas, 20 central apneas, 3 mixed apneas, 139 hypopneas.  Most events were associated with nonsupine sleep and REM.  REM AHI 169.4.  Because sleep onset was delayed and early sleep was not well maintained.  She did not meet the accumulated sleep requirement component necessary for initiation of CPAP titration by split protocol.  OXYGEN DATA:  Very loud snoring with oxygen desaturation to a nadir of 75% and mean oxygen saturation through the study of 92.6% on room air. A total of 106.1 minutes was recorded during the study with room air saturation less than 88%.  CARDIAC DATA:  Sinus rhythm with PVCs.  MOVEMENT-PARASOMNIA:  No significant movement disturbance.  Bathroom x3.  IMPRESSIONS-RECOMMENDATIONS: 1. Very severe obstructive sleep apnea/hypopnea syndrome, AHI 142.9     per hour with nonsupine events, very loud  snoring and oxygen     desaturation to a nadir of 75% with mean oxygen saturation of 92.6%     on room air through the study. 2. CPAP titration by split protocol requires the patient accumulate     enough sleep time, before CPAP was initiated, to meet the     qualifying rules for the diagnosis. She had delayed sleep onset and     some initial difficulty     maintaining sleep.  Strongly recommend return for CPAP titration,     or evaluate for alternative management as clinically appropriate.     Clinton D. Maple Hudson, MD, [facility], FACP Diplomate, Biomedical engineer of Sleep Medicine Electronically Signed    CDY/MEDQ  D:  06/25/2010 10:34:22  T:  06/25/2010 19:04:50  Job:  [id number]

## 2010-08-02 ENCOUNTER — Ambulatory Visit (HOSPITAL_BASED_OUTPATIENT_CLINIC_OR_DEPARTMENT_OTHER): Payer: Medicaid Other

## 2010-08-07 ENCOUNTER — Ambulatory Visit (HOSPITAL_BASED_OUTPATIENT_CLINIC_OR_DEPARTMENT_OTHER): Payer: Medicaid Other | Attending: Internal Medicine

## 2010-08-07 DIAGNOSIS — G4730 Sleep apnea, unspecified: Secondary | ICD-10-CM | POA: Insufficient documentation

## 2010-08-07 DIAGNOSIS — G4710 Hypersomnia, unspecified: Secondary | ICD-10-CM | POA: Insufficient documentation

## 2010-08-13 DIAGNOSIS — G4730 Sleep apnea, unspecified: Secondary | ICD-10-CM

## 2010-08-13 DIAGNOSIS — G4710 Hypersomnia, unspecified: Secondary | ICD-10-CM

## 2010-08-13 NOTE — Procedures (Signed)
NAMEBONNEY, BERRES                 ACCOUNT NO.:  [id number]  MEDICAL RECORD NO.:  [id number]          PATIENT TYPE:  OUT  LOCATION:  SLEEP CENTER                 FACILITY[facility]  [facility]  PHYSICIAN:  Clinton D. Maple Hudson, MD, FCCP, FACPDATE OF BIRTH:  10[birth year]-63  DATE OF STUDY:  08/07/2010                           NOCTURNAL POLYSOMNOGRAM  REFERRING PHYSICIAN:  EDWIN A AVBUERE  REFERRING PHYSICIAN:  Fleet Contras, MD.  INDICATIONS FOR STUDY:  Hypersomnia with sleep apnea.  Epworth sleepiness score 3/24.  BMI 58.4.  Weight 362 pounds.  Height 66 inches. Neck 15 inches.  HOME MEDICATIONS:  Charted and reviewed.  A baseline diagnostic NPSG on June 13, 2010 recorded an AHI of 142.9 per hour.  CPAP titration is requested.  SLEEP ARCHITECTURE:  Total sleep time 308 minutes with sleep efficiency 77.9%.  Stage I was 2.6%.  Stage II 51%.  Stage III 22.2%.  REM 24.2% of total sleep time.  Sleep latency 16.5 minutes.  REM latency 136 minutes. Awake after sleep onset 18.5 minutes.  Arousal index 5.1.  BEDTIME MEDICATION:  None.  RESPIRATORY DATA:  CPAP titration protocol.  CPAP was titrated to 23 CWP, AHI zero per hour.  She wore a medium ResMed Quattro FX full-face mask with heated humidifier.  OXYGEN DATA:  Snoring was prevented by CPAP and mean oxygen saturation held 95.8% on room air.  CARDIAC DATA:  Sinus rhythm with occasional PVC.  MOVEMENT/PARASOMNIA:  No significant movement disturbance.  Bathroom x2.  IMPRESSION/RECOMMENDATIONS:  Successful CPAP titration to a final pressure of 23 CWP.  This is a high pressure and would require a bilevel machine.  On review of the study, adequate control was achieved at 16 CWP with an AHI of 2.3 and only occasional breakthrough events. Anticipating better comfort and tolerance.  I would recommend initial home trial at 16 CWP.     Clinton D. Maple Hudson, MD, [facility], FACP Diplomate, Biomedical engineer of Sleep Medicine Electronically Signed    CDY/MEDQ  D:   08/13/2010 09:16:26  T:  08/13/2010 09:51:22  Job:  [id number]

## 2010-09-30 LAB — INFLUENZA A AND B ANTIGEN (CONVERTED LAB): Inflenza A Ag: POSITIVE — AB

## 2010-09-30 LAB — POCT RAPID STREP A: Streptococcus, Group A Screen (Direct): NEGATIVE

## 2010-10-25 LAB — URINALYSIS, ROUTINE W REFLEX MICROSCOPIC
Glucose, UA: NEGATIVE
Ketones, ur: NEGATIVE
Nitrite: NEGATIVE
Specific Gravity, Urine: 1.007
pH: 7

## 2010-10-25 LAB — URINE MICROSCOPIC-ADD ON

## 2010-10-26 LAB — URINE MICROSCOPIC-ADD ON

## 2010-10-26 LAB — URINALYSIS, ROUTINE W REFLEX MICROSCOPIC
Ketones, ur: NEGATIVE
Leukocytes, UA: NEGATIVE
Nitrite: NEGATIVE
Protein, ur: NEGATIVE
Urobilinogen, UA: 1

## 2010-10-27 LAB — POCT URINALYSIS DIP (DEVICE)
Operator id: 239701
Protein, ur: NEGATIVE
Urobilinogen, UA: 0.2

## 2010-10-27 LAB — POCT PREGNANCY, URINE: Operator id: 239701

## 2011-01-18 ENCOUNTER — Encounter (HOSPITAL_COMMUNITY): Payer: Self-pay | Admitting: *Deleted

## 2011-01-18 ENCOUNTER — Emergency Department (INDEPENDENT_AMBULATORY_CARE_PROVIDER_SITE_OTHER)
Admission: EM | Admit: 2011-01-18 | Discharge: 2011-01-18 | Disposition: A | Discharge status: Home / Self Care | Payer: Medicaid Other | Source: Home / Self Care | Attending: Family Medicine | Admitting: Family Medicine

## 2011-01-18 DIAGNOSIS — J329 Chronic sinusitis, unspecified: Secondary | ICD-10-CM

## 2011-01-18 MED ORDER — PHENYLEPHRINE-BROMPHEN-CODEINE 7.5-4-10 MG/5ML PO LIQD: ORAL | Status: DC

## 2011-01-18 MED ORDER — AZITHROMYCIN 250 MG PO TABS: 250.0000 mg | ORAL_TABLET | Freq: Every day | ORAL | Status: AC

## 2011-01-18 MED ORDER — PREDNISONE 20 MG PO TABS: ORAL_TABLET | ORAL | Status: AC

## 2011-01-18 MED ORDER — BENZONATATE 100 MG PO CAPS: 100.0000 mg | ORAL_CAPSULE | Freq: Three times a day (TID) | ORAL | Status: AC

## 2011-01-18 NOTE — ED Notes (Signed)
Pt  For  About  1  Week  Has  Had  Sinus  Drainage  stuffynose  And   Pain in  Sides  From  Coughing       She  Reports  The  Cough is  For  The  Most part  Non  Productive    She  Is  Awake  And  Alert  No  Severe  Distress

## 2011-01-18 NOTE — ED Provider Notes (Signed)
History     CSN: [id number]  Arrival date & time 01/18/11  1845   First MD Initiated Contact with Patient 01/18/11 1854      Chief Complaint  Patient presents with  . Cough    (Consider location/radiation/quality/duration/timing/severity/associated sxs/prior treatment) HPI Comments: 46 y/o female smoker with h/o type 2 DM and chronic use of albuterol as per her report. Here c/o nasal congestion, sinus pain and cough that is intermittently productive  for 1 week. Denies fever, chest pain or shortness of breath. Good appetite.   Past Medical History  Diagnosis Date  . Diabetes mellitus     History reviewed. No pertinent past surgical history.  History reviewed. No pertinent family history.  History  Substance Use Topics  . Smoking status: Current Everyday Smoker  . Smokeless tobacco: Not on file  . Alcohol Use: No    OB History    Grav Para Term Preterm Abortions TAB SAB Ect Mult Living                  Review of Systems  Constitutional: Negative for fever, chills, appetite change and fatigue.  HENT: Positive for congestion, rhinorrhea and sinus pressure. Negative for sore throat and trouble swallowing.   Respiratory: Positive for cough. Negative for chest tightness, shortness of breath and wheezing.   Cardiovascular: Negative for chest pain, palpitations and leg swelling.  Gastrointestinal: Negative for abdominal pain.  Neurological: Negative for dizziness and headaches.    Allergies  Review of patient's allergies indicates not on file.  Home Medications   Current Outpatient Rx  Name Route Sig Dispense Refill  . ALBUTEROL SULFATE HFA 108 (90 BASE) MCG/ACT IN AERS Inhalation Inhale 2 puffs into the lungs every 6 (six) hours as needed.    [name] FLUCONAZOLE 150 MG PO TABS Oral Take 150 mg by mouth once.    [name] FLUOXETINE HCL 20 MG PO TABS Oral Take 20 mg by mouth daily.    [name] GABAPENTIN 400 MG PO CAPS Oral Take 400 mg by mouth 3 (three) times daily.    [name] METRONIDAZOLE  500 MG PO TABS Oral Take 500 mg by mouth 3 (three) times daily.    . AZITHROMYCIN 250 MG PO TABS Oral Take 1 tablet (250 mg total) by mouth daily. Take first 2 tablets together, then 1 every day until finished. 6 tablet 0  . BENZONATATE 100 MG PO CAPS Oral Take 1 capsule (100 mg total) by mouth every 8 (eight) hours. 21 capsule 0  . PHENYLEPHRINE-BROMPHEN-CODEINE 7.05-12-08 MG/5ML PO LIQD  5 ml tid prn for cough and congestion 120 mL 0  . PREDNISONE 20 MG PO TABS  2 tabs po daily for 5 days 10 tablet no    BP 151/83  Pulse 80  Temp(Src) 98.3 F (36.8 C) (Oral)  Resp 23  SpO2 100%  LMP 12/29/2010  Physical Exam  Nursing note and vitals reviewed. Constitutional: She is oriented to person, place, and time. She appears well-developed and well-nourished. No distress.  HENT:  Head: Normocephalic and atraumatic.  Right Ear: Tympanic membrane, external ear and ear canal normal. Tympanic membrane is not erythematous, not retracted and not bulging.  Left Ear: Tympanic membrane, external ear and ear canal normal. Tympanic membrane is not erythematous, not retracted and not bulging.  Nose: Mucosal edema and rhinorrhea present. Right sinus exhibits maxillary sinus tenderness. Right sinus exhibits no frontal sinus tenderness. Left sinus exhibits maxillary sinus tenderness. Left sinus exhibits no frontal sinus tenderness.  Mouth/Throat: Uvula  is midline, oropharynx is clear and moist and mucous membranes are normal.  Neck: Neck supple.  Cardiovascular: Normal rate, regular rhythm and normal heart sounds.   Pulmonary/Chest: Effort normal and breath sounds normal. No respiratory distress. She has no wheezes. She has no rales. She exhibits no tenderness.  Abdominal: Soft. There is no tenderness.  Lymphadenopathy:    She has no cervical adenopathy.  Neurological: She is alert and oriented to person, place, and time.  Skin: No rash noted.    ED Course  Procedures (including critical care time)  Labs  Reviewed - No data to display No results found.   1. Sinusitis       MDM  Continue albuterol prn, encouraged to quit smoking,  treated with azithromycin, prednisone and cough/antihistamin medications. Encourage to check blood sugar daily.       Sharin Grave, MD 01/20/11 [phone]

## 2011-06-23 ENCOUNTER — Other Ambulatory Visit (HOSPITAL_COMMUNITY): Payer: Self-pay | Admitting: Internal Medicine

## 2011-06-23 DIAGNOSIS — Z1231 Encounter for screening mammogram for malignant neoplasm of breast: Secondary | ICD-10-CM

## 2011-07-03 ENCOUNTER — Ambulatory Visit (HOSPITAL_COMMUNITY): Payer: Medicaid Other

## 2011-07-25 ENCOUNTER — Ambulatory Visit (HOSPITAL_COMMUNITY): Payer: Medicaid Other

## 2011-08-10 ENCOUNTER — Encounter: Payer: Medicaid Other | Attending: Internal Medicine | Admitting: *Deleted

## 2011-08-10 ENCOUNTER — Encounter: Payer: Self-pay | Admitting: *Deleted

## 2011-08-10 DIAGNOSIS — E119 Type 2 diabetes mellitus without complications: Secondary | ICD-10-CM | POA: Insufficient documentation

## 2011-08-10 DIAGNOSIS — Z713 Dietary counseling and surveillance: Secondary | ICD-10-CM | POA: Insufficient documentation

## 2011-08-10 NOTE — Patient Instructions (Addendum)
Plan: Aim for 5 Carb Choices (75 grams) per meal +/- 1 either way Aim for 2 Carb Choices (30 grams) per snack if hungry Continue with small portions of fat on food Continue checking your BG as directed by MD Consider switching from regular to diet sodas Consider cooking a fried meal only once a week or less Consider Arm Chair exercises 5-10 minutes once or twice a day, every day.

## 2011-08-10 NOTE — Progress Notes (Signed)
  Medical Nutrition Therapy:  Appt start time: 1015 end time:  1115.  Assessment:  Primary concerns today: patient here for diabetes and obesity. Home during the day, boyfriend has been encouraging to walk more, currently around the block every other day due to knee pain. Newly diagnosed in January with diabetes, SMBG 3 times a day with range of 110-300 mg/dl. Current A1c = 7.5%  MEDICATIONS: see list   DIETARY INTAKE:  Usual eating pattern includes 3 meals and 3 snacks per day.  Everyday foods include high calorie high fat foods.  Avoided foods include: none stated.    24-hr recall:  B ( AM): left overs OR 1 [place] bacon and sausage, SF jello, water, occasionally coffee or tea  Snk ( AM): sweets usually, fried sugar bread  L ( PM): 2 sandwiches with chips out of the bag, 16 oz regular soda in past Snk ( PM): same as AM D ( PM):dark meat, 2 thighs fried chicken, 2 scoops macaroni and cheese or rice, 2 scoops corn, 2 biscuits Snk ( PM): same as AM Beverages: water, coffee, regular soda, sweetened tea  Usual physical activity: has started walking around the block at her boyfriends suggestion  Estimated energy needs: 2800 calories 316 g carbohydrates 210 g protein 78 g fat  Progress Towards Goal(s):  In progress.   Nutritional Diagnosis:  NI-1.5 Excessive energy intake As related to activity level.  As evidenced by BMI of 62.5.    Intervention:  Nutrition counseling and diabetes education initiated. Discussed basic physiology of diabetes, SMBG and rationale of checking BG at alternate times of day, A1c, Carb Counting and reading food labels, and benefits of increased activity.   Plan: Aim for 5 Carb Choices (75 grams) per meal +/- 1 either way Aim for 2 Carb Choices (30 grams) per snack if hungry Continue with small portions of fat on food Continue checking your BG as directed by MD Consider switching from regular to diet sodas Consider cooking a fried meal only once a week or  less Consider Arm Chair exercises 5-10 minutes once or twice a day, every day.  Handouts given during visit include: Living Well with Diabetes Carb Counting and Food Label handouts Meal Plan Card  Monitoring/Evaluation:  Dietary intake, exercise, reading food labels, and body weight prn.

## 2011-08-25 ENCOUNTER — Ambulatory Visit (HOSPITAL_COMMUNITY): Payer: Medicaid Other | Attending: Internal Medicine

## 2012-03-24 ENCOUNTER — Emergency Department (HOSPITAL_COMMUNITY)
Admission: EM | Admit: 2012-03-24 | Discharge: 2012-03-24 | Disposition: A | Discharge status: Home / Self Care | Payer: Medicaid Other | Attending: Emergency Medicine | Admitting: Emergency Medicine

## 2012-03-24 ENCOUNTER — Encounter (HOSPITAL_COMMUNITY): Payer: Self-pay | Admitting: *Deleted

## 2012-03-24 DIAGNOSIS — E669 Obesity, unspecified: Secondary | ICD-10-CM | POA: Insufficient documentation

## 2012-03-24 DIAGNOSIS — F17200 Nicotine dependence, unspecified, uncomplicated: Secondary | ICD-10-CM | POA: Insufficient documentation

## 2012-03-24 DIAGNOSIS — H5789 Other specified disorders of eye and adnexa: Secondary | ICD-10-CM | POA: Insufficient documentation

## 2012-03-24 DIAGNOSIS — I10 Essential (primary) hypertension: Secondary | ICD-10-CM | POA: Insufficient documentation

## 2012-03-24 DIAGNOSIS — E119 Type 2 diabetes mellitus without complications: Secondary | ICD-10-CM | POA: Insufficient documentation

## 2012-03-24 DIAGNOSIS — J45909 Unspecified asthma, uncomplicated: Secondary | ICD-10-CM | POA: Insufficient documentation

## 2012-03-24 DIAGNOSIS — Z79899 Other long term (current) drug therapy: Secondary | ICD-10-CM | POA: Insufficient documentation

## 2012-03-24 DIAGNOSIS — H5711 Ocular pain, right eye: Secondary | ICD-10-CM

## 2012-03-24 DIAGNOSIS — H538 Other visual disturbances: Secondary | ICD-10-CM | POA: Insufficient documentation

## 2012-03-24 DIAGNOSIS — H209 Unspecified iridocyclitis: Secondary | ICD-10-CM

## 2012-03-24 DIAGNOSIS — H5710 Ocular pain, unspecified eye: Secondary | ICD-10-CM | POA: Insufficient documentation

## 2012-03-24 MED ORDER — FLUORESCEIN SODIUM 1 MG OP STRP
1.0000 | ORAL_STRIP | Freq: Once | OPHTHALMIC | Status: AC
  Administered 2012-03-24: 03:00:00 via OPHTHALMIC
  Filled 2012-03-24: qty 1

## 2012-03-24 MED ORDER — HYDROCODONE-ACETAMINOPHEN 5-325 MG PO TABS
2.0000 | ORAL_TABLET | Freq: Once | ORAL | Status: AC
  Administered 2012-03-24: 2 via ORAL
  Filled 2012-03-24: qty 2

## 2012-03-24 MED ORDER — TOBRAMYCIN 0.3 % OP SOLN
2.0000 [drp] | OPHTHALMIC | Status: DC
  Administered 2012-03-24: 2 [drp] via OPHTHALMIC
  Filled 2012-03-24: qty 5

## 2012-03-24 MED ORDER — TETRACAINE HCL 0.5 % OP SOLN
2.0000 [drp] | Freq: Once | OPHTHALMIC | Status: AC
  Administered 2012-03-24: 2 [drp] via OPHTHALMIC
  Filled 2012-03-24: qty 2

## 2012-03-24 MED ORDER — OXYCODONE-ACETAMINOPHEN 5-325 MG PO TABS: 1.0000 | ORAL_TABLET | Freq: Four times a day (QID) | ORAL | Status: DC | PRN

## 2012-03-24 NOTE — ED Notes (Signed)
Pt c/o right eye pain/"pulling sensation" x 14 days.  10/10 pain

## 2012-03-24 NOTE — ED Provider Notes (Signed)
History     CSN: [id number]  Arrival date & time 03/24/12  0139   First MD Initiated Contact with Patient 03/24/12 0203      Chief Complaint  Patient presents with  . Eye Pain   HPI  History provided by the patient. Patient is a 47 year old female with history of hypertension, diabetes and asthma who presents with continued right eye pain and redness. Patient reports symptoms first began while walking to the grocery store or pulse 2 weeks ago. She states her lites began to irritate her right eye she does recall rubbing her eyes home. Since that time she has had some pain and redness with tearing of the eye. She has been taking Tylenol to help with the pain the symptoms have been worsening and are especially worse this evening. She reports slight blurred vision from tears but otherwise we'll vision. No double vision. Denies any nausea, vomiting or abdominal discomfort. She denies any fever, chills or sweats. Denies having similar symptoms previously. Patient does wear glasses but has never used contacts.    Past Medical History  Diagnosis Date  . Diabetes mellitus   . Asthma   . Hypertension   . Obesity   . Sleep apnea     History reviewed. No pertinent past surgical history.  Family History  Problem Relation Age of Onset  . Asthma Other   . Hypertension Other   . Stroke Other   . Heart disease Other     History  Substance Use Topics  . Smoking status: Current Every Day Smoker    Types: Cigarettes  . Smokeless tobacco: Never Used  . Alcohol Use: No    OB History   Grav Para Term Preterm Abortions TAB SAB Ect Mult Living                  Review of Systems  Constitutional: Negative for fever and chills.  HENT: Negative for congestion and rhinorrhea.   Eyes: Positive for photophobia, pain and redness. Negative for itching and visual disturbance.  Respiratory: Negative for cough.   Gastrointestinal: Negative for nausea, vomiting and abdominal pain.  All other  systems reviewed and are negative.    Allergies  Review of patient's allergies indicates no known allergies.  Home Medications   Current Outpatient Rx  Name  Route  Sig  Dispense  Refill  . acetaminophen (TYLENOL) 500 MG tablet   Oral   Take 500 mg by mouth every 6 (six) hours as needed for pain.         [name] albuterol (PROVENTIL HFA;VENTOLIN HFA) 108 (90 BASE) MCG/ACT inhaler   Inhalation   Inhale 2 puffs into the lungs every 6 (six) hours as needed for shortness of breath.          [name] buPROPion (WELLBUTRIN SR) 200 MG 12 hr tablet   Oral   Take 200 mg by mouth 2 (two) times daily.         . metFORMIN (GLUCOPHAGE) 500 MG tablet   Oral   Take 500 mg by mouth daily with breakfast.           BP 90/51  Pulse 83  Temp(Src) 97.9 F (36.6 C) (Oral)  Resp 20  SpO2 95%  LMP 03/04/2012  Physical Exam  Nursing note and vitals reviewed. Constitutional: She is oriented to person, place, and time. She appears well-developed and well-nourished. No distress.  HENT:  Head: Normocephalic and atraumatic.  Eyes: EOM are normal. Pupils are equal, round,  and reactive to light. No foreign bodies found. Right eye exhibits no chemosis and no exudate. No foreign body present in the right eye. Right conjunctiva is injected.  Slit lamp exam:      The right eye shows no corneal abrasion, no corneal ulcer, no foreign body, no hyphema, no fluorescein uptake and no anterior chamber bulge.  Ocular pressure of 14 in right eye  Cardiovascular: Normal rate and regular rhythm.   Pulmonary/Chest: Effort normal and breath sounds normal.  Neurological: She is alert and oriented to person, place, and time.  Skin: Skin is warm and dry. No rash noted.  Psychiatric: She has a normal mood and affect. Her behavior is normal.    ED Course  Procedures      1. Eye pain, right   2. Iritis       MDM  2:20AM patient seen and evaluated. Patient well-appearing in no acute distress. Does appear  uncomfortable with irritation of the right eye.   Patient with no signs of corneal abrasion. There is not significant swelling of the conjunctiva. Ocular pressures normal without concerns for glaucoma. Patient does have light sensitivity and findings may be more consistent with iritis. Will provide a prescription for pain medications and ophthalmology referral.      Angus Seller, PA-C 03/24/12 2125

## 2012-03-24 NOTE — ED Notes (Signed)
Pt reports decrease pain, denies any questions upon discharge.

## 2012-04-08 NOTE — ED Provider Notes (Signed)
Medical screening examination/treatment/procedure(s) were performed by non-physician practitioner and as supervising physician I was immediately available for consultation/collaboration.  Julie Manly, MD 04/08/12 0727 

## 2012-05-28 ENCOUNTER — Ambulatory Visit: Payer: Medicaid Other | Attending: Internal Medicine | Admitting: Internal Medicine

## 2012-05-28 DIAGNOSIS — F411 Generalized anxiety disorder: Secondary | ICD-10-CM

## 2012-05-28 DIAGNOSIS — M1710 Unilateral primary osteoarthritis, unspecified knee: Secondary | ICD-10-CM

## 2012-05-28 DIAGNOSIS — M1712 Unilateral primary osteoarthritis, left knee: Secondary | ICD-10-CM | POA: Insufficient documentation

## 2012-05-28 DIAGNOSIS — M25569 Pain in unspecified knee: Secondary | ICD-10-CM | POA: Insufficient documentation

## 2012-05-28 DIAGNOSIS — J45909 Unspecified asthma, uncomplicated: Secondary | ICD-10-CM | POA: Insufficient documentation

## 2012-05-28 DIAGNOSIS — E119 Type 2 diabetes mellitus without complications: Secondary | ICD-10-CM

## 2012-05-28 HISTORY — DX: Type 2 diabetes mellitus without complications: E11.9

## 2012-05-28 MED ORDER — ALBUTEROL SULFATE HFA 108 (90 BASE) MCG/ACT IN AERS: 2.0000 | INHALATION_SPRAY | Freq: Four times a day (QID) | RESPIRATORY_TRACT | Status: DC | PRN

## 2012-05-28 MED ORDER — TRAMADOL HCL 50 MG PO TABS: 50.0000 mg | ORAL_TABLET | Freq: Four times a day (QID) | ORAL | Status: DC | PRN

## 2012-05-28 MED ORDER — FREESTYLE LANCETS MISC: Status: DC

## 2012-05-28 MED ORDER — NAPROXEN 500 MG PO TABS: 500.0000 mg | ORAL_TABLET | Freq: Two times a day (BID) | ORAL | Status: DC

## 2012-05-28 NOTE — Progress Notes (Signed)
Patient ID: Tara Keller, female   DOB: 23-Jan-1965, 47 y.o.   MRN: [id number] Patient Demographics  Tara Keller, is a 47 y.o. female  JXB:[id number]  ZHY:[id number]  DOB - 1965/01/25  Chief Complaint  Patient presents with  . Establish Care     knee pain and swelling        Subjective:   Tara Keller today is here to establish primary care. Patient has No headache, No chest pain, No abdominal pain - No Nausea, No new weakness tingling or numbness, No Cough - SOB.  C/o pain in both knees, worse on walking, left more than right. Otherwise doing well.  Requesting GYN referral for Pap smear. Last Pap smear was 2 years ago. She has a history of abnormal cells on Pap 7 years ago.   Objective:    Filed Vitals:   05/28/12 1524  BP: 134/75  Pulse: 66  Temp: 98.6 F (37 C)  TempSrc: Oral  Resp: 20  Height: 5\' 6"  (1.676 m)  Weight: 372 lb (168.738 kg)  SpO2: 98%     ALLERGIES:  No Known Allergies  PAST MEDICAL HISTORY: Past Medical History  Diagnosis Date  . Diabetes mellitus   . Asthma   . Hypertension   . Obesity   . Sleep apnea     PAST SURGICAL HISTORY: Past Surgical History  Procedure Laterality Date  . Cholecystectomy      FAMILY HISTORY: Family History  Problem Relation Age of Onset  . Diabetes Mother   . Stroke Mother   . Hypertension Mother   . Cancer Sister     MEDICATIONS AT HOME: Prior to Admission medications   Medication Sig Start Date End Date Taking? Authorizing Provider  albuterol (PROVENTIL HFA;VENTOLIN HFA) 108 (90 BASE) MCG/ACT inhaler Inhale 2 puffs into the lungs every 6 (six) hours as needed for shortness of breath. 05/28/12  Yes Deloris Mittag K Tyrann Donaho, MD  metFORMIN (GLUCOPHAGE) 500 MG tablet Take 500 mg by mouth daily with breakfast.   Yes Historical Provider, MD  acetaminophen (TYLENOL) 500 MG tablet Take 500 mg by mouth every 6 (six) hours as needed for pain.    Historical Provider, MD  buPROPion (WELLBUTRIN SR) 200 MG 12 hr tablet Take  200 mg by mouth 2 (two) times daily.    Historical Provider, MD  naproxen (NAPROSYN) 500 MG tablet Take 1 tablet (500 mg total) by mouth 2 (two) times daily with a meal. 05/28/12   Sotirios Navarro Jenna Luo, MD  traMADol (ULTRAM) 50 MG tablet Take 1 tablet (50 mg total) by mouth every 6 (six) hours as needed for pain. 05/28/12   Sevrin Sally Jenna Luo, MD    REVIEW OF SYSTEMS:  Constitutional:   No   Fevers, chills, fatigue.  HEENT:    No headaches, Sore throat,   Cardio-vascular: No chest pain,  Orthopnea, swelling in lower extremities, anasarca, palpitations  GI:  No abdominal pain, nausea, vomiting, diarrhea  Resp: No shortness of breath,  No coughing up of blood.No cough.No wheezing.  Skin:  no rash or lesions.  GU:  no dysuria, change in color of urine, no urgency or frequency.  No flank pain.  Musculoskeletal: See history of present illness  Psych: No change in mood or affect. No depression or anxiety.  No memory loss.   Exam  General appearance :Awake, alert, NAD, Speech Clear. HEENT: Atraumatic and Normocephalic, PERLA Neck: supple, no JVD. No cervical lymphadenopathy.  Chest: clear to auscultation bilaterally, no wheezing, rales or rhonchi CVS:  S1 S2 regular, no murmurs.  Abdomen: Morbid obesity, soft, NBS, NT, ND, no gaurding, rigidity or rebound. Extremities: No cyanosis, clubbing, B/L Lower Ext shows no edema,  Both knees examined, no significant effusion, erythema, tenderness or swelling. Neurology: Awake alert, and oriented X 3, CN II-XII intact, Non focal Skin:No Rash or lesions Wounds: N/A    Data Review   Basic Metabolic Panel: No results found for this basename: NA, K, CL, CO2, GLUCOSE, BUN, CREATININE, CALCIUM, MG, PHOS,  in the last 168 hours Liver Function Tests: No results found for this basename: AST, ALT, ALKPHOS, BILITOT, PROT, ALBUMIN,  in the last 168 hours  CBC: No results found for this basename: WBC, NEUTROABS, HGB, HCT, MCV, PLT,  in the last 168  hours ------------------------------------------------------------------------------------------------------------------ No results found for this basename: HGBA1C,  in the last 72 hours ------------------------------------------------------------------------------------------------------------------ No results found for this basename: CHOL, HDL, LDLCALC, TRIG, CHOLHDL, LDLDIRECT,  in the last 72 hours ------------------------------------------------------------------------------------------------------------------ No results found for this basename: TSH, T4TOTAL, FREET3, T3FREE, THYROIDAB,  in the last 72 hours ------------------------------------------------------------------------------------------------------------------ No results found for this basename: VITAMINB12, FOLATE, FERRITIN, TIBC, IRON, RETICCTPCT,  in the last 72 hours  Coagulation profile  No results found for this basename: INR, PROTIME,  in the last 168 hours    Assessment & Plan   Active Problems: 1. diabetes mellitus type 2 - Continue metformin, obtain hemoglobin A1c, BMET with next visit - Patient states that she does have refills on metformin and does not need any now - Will send ophthalmology referral at the next visit  2. morbid obesity - Discussed in detail with the patient to watch her diet, exercise  3. Asthma/bronchitis - Continue albuterol as needed  4. history of abnormal PAP smear - Last Pap smear 2 years ago, sent GYN referral for routine GYN evaluation and Pap smear  5. knee pains: Likely secondary to osteoarthritis  - Will check bilateral knee x-rays, placed on Naprosyn and tramadol as needed  Recommendations: Follow up in 2 months BMET, Hemoglobin A1c prior to the next visit Bilateral knee x-rays   Elliott Lasecki M.D. 05/28/2012, 3:48 PM

## 2012-05-28 NOTE — Patient Instructions (Signed)
Osteoarthritis Osteoarthritis is the most common form of arthritis. It is redness, soreness, and swelling (inflammation) affecting the cartilage. Cartilage acts as a cushion, covering the ends of bones where they meet to form a joint. CAUSES  Over time, the cartilage begins to wear away. This causes bone to rub on bone. This produces pain and stiffness in the affected joints. Factors that contribute to this problem are:  Excessive body weight.  Age.  Overuse of joints. SYMPTOMS   People with osteoarthritis usually experience joint pain, swelling, or stiffness.  Over time, the joint may lose its normal shape.  Small deposits of bone (osteophytes) may grow on the edges of the joint.  Bits of bone or cartilage can break off and float inside the joint space. This may cause more pain and damage.  Osteoarthritis can lead to depression, anxiety, feelings of helplessness, and limitations on daily activities. The most commonly affected joints are in the:  Ends of the fingers.  Thumbs.  Neck.  Lower back.  Knees.  Hips. DIAGNOSIS  Diagnosis is mostly based on your symptoms and exam. Tests may be helpful, including:  X-rays of the affected joint.  A computerized magnetic scan (MRI).  Blood tests to rule out other types of arthritis.  Joint fluid tests. This involves using a needle to draw fluid from the joint and examining the fluid under a microscope. TREATMENT  Goals of treatment are to control pain, improve joint function, maintain a normal body weight, and maintain a healthy lifestyle. Treatment approaches may include:  A prescribed exercise program with rest and joint relief.  Weight control with nutritional education.  Pain relief techniques such as:  Properly applied heat and cold.  Electric pulses delivered to nerve endings under the skin (transcutaneous electrical nerve stimulation, TENS).  Massage.  Certain supplements. Ask your caregiver before using any  supplements, especially in combination with prescribed drugs.  Medicines to control pain, such as:  Acetaminophen.  Nonsteroidal anti-inflammatory drugs (NSAIDs), such as naproxen.  Narcotic or central-acting agents, such as tramadol. This drug carries a risk of addiction and is generally prescribed for short-term use.  Corticosteroids. These can be given orally or as injection. This is a short-term treatment, not recommended for routine use.  Surgery to reposition the bones and relieve pain (osteotomy) or to remove loose pieces of bone and cartilage. Joint replacement may be needed in advanced states of osteoarthritis. HOME CARE INSTRUCTIONS  Your caregiver can recommend specific types of exercise. These may include:  Strengthening exercises. These are done to strengthen the muscles that support joints affected by arthritis. They can be performed with weights or with exercise bands to add resistance.  Aerobic activities. These are exercises, such as brisk walking or low-impact aerobics, that get your heart pumping. They can help keep your lungs and circulatory system in shape.  Range-of-motion activities. These keep your joints limber.  Balance and agility exercises. These help you maintain daily living skills. Learning about your condition and being actively involved in your care will help improve the course of your osteoarthritis. SEEK MEDICAL CARE IF:   You feel hot or your skin turns red.  You develop a rash in addition to your joint pain.  You have an oral temperature above 102 F (38.9 C). FOR MORE INFORMATION  National Institute of Arthritis and Musculoskeletal and Skin Diseases: www.niams.nih.gov National Institute on Aging: www.nia.nih.gov American College of Rheumatology: www.rheumatology.org Document Released: 12/26/2004 Document Revised: 03/20/2011 Document Reviewed: 04/08/2009 [employer] Patient Information 2013 [employer].  

## 2012-05-28 NOTE — Progress Notes (Signed)
Pt. Present with bilateral knee pain.

## 2012-06-06 ENCOUNTER — Encounter: Payer: Self-pay | Admitting: Obstetrics & Gynecology

## 2012-06-24 ENCOUNTER — Ambulatory Visit
Admission: RE | Admit: 2012-06-24 | Discharge: 2012-06-24 | Disposition: A | Discharge status: Home / Self Care | Payer: Medicaid Other | Source: Ambulatory Visit | Attending: Internal Medicine | Admitting: Internal Medicine

## 2012-06-24 DIAGNOSIS — E119 Type 2 diabetes mellitus without complications: Secondary | ICD-10-CM

## 2012-06-24 DIAGNOSIS — F411 Generalized anxiety disorder: Secondary | ICD-10-CM

## 2012-06-24 DIAGNOSIS — M1712 Unilateral primary osteoarthritis, left knee: Secondary | ICD-10-CM

## 2012-07-04 ENCOUNTER — Encounter: Payer: Self-pay | Admitting: Obstetrics & Gynecology

## 2012-07-04 ENCOUNTER — Other Ambulatory Visit (HOSPITAL_COMMUNITY)
Admission: RE | Admit: 2012-07-04 | Discharge: 2012-07-04 | Disposition: A | Discharge status: Home / Self Care | Payer: Medicaid Other | Source: Ambulatory Visit | Attending: Obstetrics & Gynecology | Admitting: Obstetrics & Gynecology

## 2012-07-04 ENCOUNTER — Ambulatory Visit (INDEPENDENT_AMBULATORY_CARE_PROVIDER_SITE_OTHER): Payer: Medicaid Other | Admitting: Obstetrics & Gynecology

## 2012-07-04 VITALS — BP 123/79 | HR 69 | Ht 66.0 in | Wt 367.4 lb

## 2012-07-04 DIAGNOSIS — Z01419 Encounter for gynecological examination (general) (routine) without abnormal findings: Secondary | ICD-10-CM | POA: Insufficient documentation

## 2012-07-04 DIAGNOSIS — Z1151 Encounter for screening for human papillomavirus (HPV): Secondary | ICD-10-CM | POA: Insufficient documentation

## 2012-07-04 NOTE — Patient Instructions (Signed)
Preventive Care for Adults, Female A healthy lifestyle and preventive care can promote health and wellness. Preventive health guidelines for women include the following key practices.  A routine yearly physical is a good way to check with your caregiver about your health and preventive screening. It is a chance to share any concerns and updates on your health, and to receive a thorough exam.  Visit your dentist for a routine exam and preventive care every 6 months. Brush your teeth twice a day and floss once a day. Good oral hygiene prevents tooth decay and gum disease.  The frequency of eye exams is based on your age, health, family medical history, use of contact lenses, and other factors. Follow your caregiver's recommendations for frequency of eye exams.  Eat a healthy diet. Foods like vegetables, fruits, whole grains, low-fat dairy products, and lean protein foods contain the nutrients you need without too many calories. Decrease your intake of foods high in solid fats, added sugars, and salt. Eat the right amount of calories for you.Get information about a proper diet from your caregiver, if necessary.  Regular physical exercise is one of the most important things you can do for your health. Most adults should get at least 150 minutes of moderate-intensity exercise (any activity that increases your heart rate and causes you to sweat) each week. In addition, most adults need muscle-strengthening exercises on 2 or more days a week.  Maintain a healthy weight. The body mass index (BMI) is a screening tool to identify possible weight problems. It provides an estimate of body fat based on height and weight. Your caregiver can help determine your BMI, and can help you achieve or maintain a healthy weight.For adults 20 years and older:  A BMI below 18.5 is considered underweight.  A BMI of 18.5 to 24.9 is normal.  A BMI of 25 to 29.9 is considered overweight.  A BMI of 30 and above is  considered obese.  Maintain normal blood lipids and cholesterol levels by exercising and minimizing your intake of saturated fat. Eat a balanced diet with plenty of fruit and vegetables. Blood tests for lipids and cholesterol should begin at age 20 and be repeated every 5 years. If your lipid or cholesterol levels are high, you are over 50, or you are at high risk for heart disease, you may need your cholesterol levels checked more frequently.Ongoing high lipid and cholesterol levels should be treated with medicines if diet and exercise are not effective.  If you smoke, find out from your caregiver how to quit. If you do not use tobacco, do not start.  If you are pregnant, do not drink alcohol. If you are breastfeeding, be very cautious about drinking alcohol. If you are not pregnant and choose to drink alcohol, do not exceed 1 drink per day. One drink is considered to be 12 ounces (355 mL) of beer, 5 ounces (148 mL) of wine, or 1.5 ounces (44 mL) of liquor.  Avoid use of street drugs. Do not share needles with anyone. Ask for help if you need support or instructions about stopping the use of drugs.  High blood pressure causes heart disease and increases the risk of stroke. Your blood pressure should be checked at least every 1 to 2 years. Ongoing high blood pressure should be treated with medicines if weight loss and exercise are not effective.  If you are 55 to 49 years old, ask your caregiver if you should take aspirin to prevent strokes.  Diabetes   screening involves taking a blood sample to check your fasting blood sugar level. This should be done once every 3 years, after age 45, if you are within normal weight and without risk factors for diabetes. Testing should be considered at a younger age or be carried out more frequently if you are overweight and have at least 1 risk factor for diabetes.  Breast cancer screening is essential preventive care for women. You should practice "breast  self-awareness." This means understanding the normal appearance and feel of your breasts and may include breast self-examination. Any changes detected, no matter how small, should be reported to a caregiver. Women in their 20s and 30s should have a clinical breast exam (CBE) by a caregiver as part of a regular health exam every 1 to 3 years. After age 40, women should have a CBE every year. Starting at age 40, women should consider having a mammography (breast X-ray test) every year. Women who have a family history of breast cancer should talk to their caregiver about genetic screening. Women at a high risk of breast cancer should talk to their caregivers about having magnetic resonance imaging (MRI) and a mammography every year.  The Pap test is a screening test for cervical cancer. A Pap test can show cell changes on the cervix that might become cervical cancer if left untreated. A Pap test is a procedure in which cells are obtained and examined from the lower end of the uterus (cervix).  Women should have a Pap test starting at age 21.  Between ages 21 and 29, Pap tests should be repeated every 2 years.  Beginning at age 30, you should have a Pap test every 3 years as long as the past 3 Pap tests have been normal.  Some women have medical problems that increase the chance of getting cervical cancer. Talk to your caregiver about these problems. It is especially important to talk to your caregiver if a new problem develops soon after your last Pap test. In these cases, your caregiver may recommend more frequent screening and Pap tests.  The above recommendations are the same for women who have or have not gotten the vaccine for human papillomavirus (HPV).  If you had a hysterectomy for a problem that was not cancer or a condition that could lead to cancer, then you no longer need Pap tests. Even if you no longer need a Pap test, a regular exam is a good idea to make sure no other problems are  starting.  If you are between ages 65 and 70, and you have had normal Pap tests going back 10 years, you no longer need Pap tests. Even if you no longer need a Pap test, a regular exam is a good idea to make sure no other problems are starting.  If you have had past treatment for cervical cancer or a condition that could lead to cancer, you need Pap tests and screening for cancer for at least 20 years after your treatment.  If Pap tests have been discontinued, risk factors (such as a new sexual partner) need to be reassessed to determine if screening should be resumed.  The HPV test is an additional test that may be used for cervical cancer screening. The HPV test looks for the virus that can cause the cell changes on the cervix. The cells collected during the Pap test can be tested for HPV. The HPV test could be used to screen women aged 30 years and older, and should   be used in women of any age who have unclear Pap test results. After the age of 30, women should have HPV testing at the same frequency as a Pap test.  Colorectal cancer can be detected and often prevented. Most routine colorectal cancer screening begins at the age of 50 and continues through age 75. However, your caregiver may recommend screening at an earlier age if you have risk factors for colon cancer. On a yearly basis, your caregiver may provide home test kits to check for hidden blood in the stool. Use of a small camera at the end of a tube, to directly examine the colon (sigmoidoscopy or colonoscopy), can detect the earliest forms of colorectal cancer. Talk to your caregiver about this at age 50, when routine screening begins. Direct examination of the colon should be repeated every 5 to 10 years through age 75, unless early forms of pre-cancerous polyps or small growths are found.  Hepatitis C blood testing is recommended for all people born from 1945 through 1965 and any individual with known risks for hepatitis C.  Practice  safe sex. Use condoms and avoid high-risk sexual practices to reduce the spread of sexually transmitted infections (STIs). STIs include gonorrhea, chlamydia, syphilis, trichomonas, herpes, HPV, and human immunodeficiency virus (HIV). Herpes, HIV, and HPV are viral illnesses that have no cure. They can result in disability, cancer, and death. Sexually active women aged 25 and younger should be checked for chlamydia. Older women with new or multiple partners should also be tested for chlamydia. Testing for other STIs is recommended if you are sexually active and at increased risk.  Osteoporosis is a disease in which the bones lose minerals and strength with aging. This can result in serious bone fractures. The risk of osteoporosis can be identified using a bone density scan. Women ages 65 and over and women at risk for fractures or osteoporosis should discuss screening with their caregivers. Ask your caregiver whether you should take a calcium supplement or vitamin D to reduce the rate of osteoporosis.  Menopause can be associated with physical symptoms and risks. Hormone replacement therapy is available to decrease symptoms and risks. You should talk to your caregiver about whether hormone replacement therapy is right for you.  Use sunscreen with sun protection factor (SPF) of 30 or more. Apply sunscreen liberally and repeatedly throughout the day. You should seek shade when your shadow is shorter than you. Protect yourself by wearing long sleeves, pants, a wide-brimmed hat, and sunglasses year round, whenever you are outdoors.  Once a month, do a whole body skin exam, using a mirror to look at the skin on your back. Notify your caregiver of new moles, moles that have irregular borders, moles that are larger than a pencil eraser, or moles that have changed in shape or color.  Stay current with required immunizations.  Influenza. You need a dose every fall (or winter). The composition of the flu vaccine  changes each year, so being vaccinated once is not enough.  Pneumococcal polysaccharide. You need 1 to 2 doses if you smoke cigarettes or if you have certain chronic medical conditions. You need 1 dose at age 65 (or older) if you have never been vaccinated.  Tetanus, diphtheria, pertussis (Tdap, Td). Get 1 dose of Tdap vaccine if you are younger than age 65, are over 65 and have contact with an infant, are a healthcare worker, are pregnant, or simply want to be protected from whooping cough. After that, you need a Td   booster dose every 10 years. Consult your caregiver if you have not had at least 3 tetanus and diphtheria-containing shots sometime in your life or have a deep or dirty wound.  HPV. You need this vaccine if you are a woman age 26 or younger. The vaccine is given in 3 doses over 6 months.  Measles, mumps, rubella (MMR). You need at least 1 dose of MMR if you were born in 1957 or later. You may also need a second dose.  Meningococcal. If you are age 19 to 21 and a first-year college student living in a residence hall, or have one of several medical conditions, you need to get vaccinated against meningococcal disease. You may also need additional booster doses.  Zoster (shingles). If you are age 60 or older, you should get this vaccine.  Varicella (chickenpox). If you have never had chickenpox or you were vaccinated but received only 1 dose, talk to your caregiver to find out if you need this vaccine.  Hepatitis A. You need this vaccine if you have a specific risk factor for hepatitis A virus infection or you simply wish to be protected from this disease. The vaccine is usually given as 2 doses, 6 to 18 months apart.  Hepatitis B. You need this vaccine if you have a specific risk factor for hepatitis B virus infection or you simply wish to be protected from this disease. The vaccine is given in 3 doses, usually over 6 months. Preventive Services / Frequency Ages 19 to 39  Blood  pressure check.** / Every 1 to 2 years.  Lipid and cholesterol check.** / Every 5 years beginning at age 20.  Clinical breast exam.** / Every 3 years for women in their 20s and 30s.  Pap test.** / Every 2 years from ages 21 through 29. Every 3 years starting at age 30 through age 65 or 70 with a history of 3 consecutive normal Pap tests.  HPV screening.** / Every 3 years from ages 30 through ages 65 to 70 with a history of 3 consecutive normal Pap tests.  Hepatitis C blood test.** / For any individual with known risks for hepatitis C.  Skin self-exam. / Monthly.  Influenza immunization.** / Every year.  Pneumococcal polysaccharide immunization.** / 1 to 2 doses if you smoke cigarettes or if you have certain chronic medical conditions.  Tetanus, diphtheria, pertussis (Tdap, Td) immunization. / A one-time dose of Tdap vaccine. After that, you need a Td booster dose every 10 years.  HPV immunization. / 3 doses over 6 months, if you are 26 and younger.  Measles, mumps, rubella (MMR) immunization. / You need at least 1 dose of MMR if you were born in 1957 or later. You may also need a second dose.  Meningococcal immunization. / 1 dose if you are age 19 to 21 and a first-year college student living in a residence hall, or have one of several medical conditions, you need to get vaccinated against meningococcal disease. You may also need additional booster doses.  Varicella immunization.** / Consult your caregiver.  Hepatitis A immunization.** / Consult your caregiver. 2 doses, 6 to 18 months apart.  Hepatitis B immunization.** / Consult your caregiver. 3 doses usually over 6 months. Ages 40 to 64  Blood pressure check.** / Every 1 to 2 years.  Lipid and cholesterol check.** / Every 5 years beginning at age 20.  Clinical breast exam.** / Every year after age 40.  Mammogram.** / Every year beginning at age 40   and continuing for as long as you are in good health. Consult with your  caregiver.  Pap test.** / Every 3 years starting at age 30 through age 65 or 70 with a history of 3 consecutive normal Pap tests.  HPV screening.** / Every 3 years from ages 30 through ages 65 to 70 with a history of 3 consecutive normal Pap tests.  Fecal occult blood test (FOBT) of stool. / Every year beginning at age 50 and continuing until age 75. You may not need to do this test if you get a colonoscopy every 10 years.  Flexible sigmoidoscopy or colonoscopy.** / Every 5 years for a flexible sigmoidoscopy or every 10 years for a colonoscopy beginning at age 50 and continuing until age 75.  Hepatitis C blood test.** / For all people born from 1945 through 1965 and any individual with known risks for hepatitis C.  Skin self-exam. / Monthly.  Influenza immunization.** / Every year.  Pneumococcal polysaccharide immunization.** / 1 to 2 doses if you smoke cigarettes or if you have certain chronic medical conditions.  Tetanus, diphtheria, pertussis (Tdap, Td) immunization.** / A one-time dose of Tdap vaccine. After that, you need a Td booster dose every 10 years.  Measles, mumps, rubella (MMR) immunization. / You need at least 1 dose of MMR if you were born in 1957 or later. You may also need a second dose.  Varicella immunization.** / Consult your caregiver.  Meningococcal immunization.** / Consult your caregiver.  Hepatitis A immunization.** / Consult your caregiver. 2 doses, 6 to 18 months apart.  Hepatitis B immunization.** / Consult your caregiver. 3 doses, usually over 6 months. Ages 65 and over  Blood pressure check.** / Every 1 to 2 years.  Lipid and cholesterol check.** / Every 5 years beginning at age 20.  Clinical breast exam.** / Every year after age 40.  Mammogram.** / Every year beginning at age 40 and continuing for as long as you are in good health. Consult with your caregiver.  Pap test.** / Every 3 years starting at age 30 through age 65 or 70 with a 3  consecutive normal Pap tests. Testing can be stopped between 65 and 70 with 3 consecutive normal Pap tests and no abnormal Pap or HPV tests in the past 10 years.  HPV screening.** / Every 3 years from ages 30 through ages 65 or 70 with a history of 3 consecutive normal Pap tests. Testing can be stopped between 65 and 70 with 3 consecutive normal Pap tests and no abnormal Pap or HPV tests in the past 10 years.  Fecal occult blood test (FOBT) of stool. / Every year beginning at age 50 and continuing until age 75. You may not need to do this test if you get a colonoscopy every 10 years.  Flexible sigmoidoscopy or colonoscopy.** / Every 5 years for a flexible sigmoidoscopy or every 10 years for a colonoscopy beginning at age 50 and continuing until age 75.  Hepatitis C blood test.** / For all people born from 1945 through 1965 and any individual with known risks for hepatitis C.  Osteoporosis screening.** / A one-time screening for women ages 65 and over and women at risk for fractures or osteoporosis.  Skin self-exam. / Monthly.  Influenza immunization.** / Every year.  Pneumococcal polysaccharide immunization.** / 1 dose at age 65 (or older) if you have never been vaccinated.  Tetanus, diphtheria, pertussis (Tdap, Td) immunization. / A one-time dose of Tdap vaccine if you are over   65 and have contact with an infant, are a healthcare worker, or simply want to be protected from whooping cough. After that, you need a Td booster dose every 10 years.  Varicella immunization.** / Consult your caregiver.  Meningococcal immunization.** / Consult your caregiver.  Hepatitis A immunization.** / Consult your caregiver. 2 doses, 6 to 18 months apart.  Hepatitis B immunization.** / Check with your caregiver. 3 doses, usually over 6 months. ** Family history and personal history of risk and conditions may change your caregiver's recommendations. Document Released: 02/21/2001 Document Revised: 03/20/2011  Document Reviewed: 05/23/2010 [employer] Patient Information 2014 [employer].  

## 2012-07-04 NOTE — Progress Notes (Signed)
  Subjective:     Tara Keller is a 47 y.o. [phone] female and is here for a comprehensive gynecologic physical exam. The patient reports no problems. Had negative mammogram this year.  Complains of vaginal odor, no pruritus, no discharge.  No other GYN concerns.  History   Social History  . Marital Status: Single    Spouse Name: N/A    Number of Children: N/A  . Years of Education: N/A   Occupational History  . Not on file.   Social History Main Topics  . Smoking status: Current Every Day Smoker -- 1.00 packs/day    Types: Cigarettes  . Smokeless tobacco: Never Used  . Alcohol Use: No  . Drug Use: Not on file  . Sexually Active: Not on file   Other Topics Concern  . Not on file   Social History Narrative  . No narrative on file   Health Maintenance  Topic Date Due  . Pap Smear  07/31/1981  . Tetanus/tdap  08/01/1982  . Influenza Vaccine  09/09/2012   The following portions of the patient's history were reviewed and updated as appropriate: allergies, current medications, past family history, past medical history, past social history, past surgical history and problem list.  Review of Systems Pertinent items are noted in HPI.   Objective:   BP 123/79  Pulse 69  Ht 5\' 6"  (1.676 m)  Wt 367 lb 6.4 oz (166.652 kg)  BMI 59.33 kg/m2  LMP 06/03/2012 GENERAL: Well-developed, obese female in no acute distress.  HEENT: Normocephalic, atraumatic. Sclerae anicteric.  NECK: Supple. Normal thyroid.  LUNGS: Clear to auscultation bilaterally.  HEART: Regular rate and rhythm.  BREASTS: Large, symmetric in size. No masses, skin changes, nipple drainage, or lymphadenopathy.  ABDOMEN: Soft, obese, nontender, nondistended. No organomegaly palpated. PELVIC: Normal external female genitalia. Vagina is pink and rugated.  Scant thin white normal-appearing discharge. Normal cervix contour. Pap smear obtained. Unable to palpate uterus or adnexa secondary to obese habitus  EXTREMITIES:  No cyanosis, clubbing, or edema, 2+ distal pulses.    Assessment:    Healthy female exam.  Plan:   Pap done, will follow up results and manage accordingly. Odor is likely secondary to sweating.  Proper vulvar care and hygiene emphasized Routine preventative health maintenance measures emphasized; follow up with PCP for other primary care issues.

## 2012-07-31 ENCOUNTER — Encounter: Payer: Self-pay | Admitting: Internal Medicine

## 2012-07-31 ENCOUNTER — Ambulatory Visit: Payer: Medicaid Other | Attending: Family Medicine | Admitting: Internal Medicine

## 2012-07-31 VITALS — BP 155/106 | HR 74 | Temp 98.0°F | Resp 16 | Wt 373.8 lb

## 2012-07-31 DIAGNOSIS — Z0000 Encounter for general adult medical examination without abnormal findings: Secondary | ICD-10-CM

## 2012-07-31 DIAGNOSIS — E119 Type 2 diabetes mellitus without complications: Secondary | ICD-10-CM | POA: Insufficient documentation

## 2012-07-31 DIAGNOSIS — M129 Arthropathy, unspecified: Secondary | ICD-10-CM

## 2012-07-31 DIAGNOSIS — M1990 Unspecified osteoarthritis, unspecified site: Secondary | ICD-10-CM

## 2012-07-31 MED ORDER — NAPROXEN 500 MG PO TABS: 500.0000 mg | ORAL_TABLET | Freq: Three times a day (TID) | ORAL | Status: DC

## 2012-07-31 MED ORDER — FREESTYLE SYSTEM KIT: 1.0000 | PACK | Status: DC | PRN

## 2012-07-31 MED ORDER — METFORMIN HCL 500 MG PO TABS: 500.0000 mg | ORAL_TABLET | Freq: Every day | ORAL | Status: DC

## 2012-07-31 NOTE — Progress Notes (Signed)
Patient ID: Tara Keller, female   DOB: Jan 14, 1965, 47 y.o.   MRN: [id number]   HPI: 47 y/o here for f/u visit. She went to Dr Candis Musa but states he was not listening in regards to pain in knees and rash on face. She then went to Dr Parke Simmers who had "an attitude". Started to come here on 5/20.   Has a dry, scaly rash on face and needs to see a dermatologist. Here to receive refills on medications.  Also has severe pain in knees. Of note, was prescribed Naprosyn here last visit- was taking Diclofenac as well which was prescribed by previous doctor already- for pain in knees.   H/o crack/ cocaine use- proud to be clean for 4 yrs.   Smokes- wants Nicotine patches to help her quit.    No Known Allergies Past Medical History  Diagnosis Date  . Diabetes mellitus   . Asthma   . Hypertension   . Obesity   . Sleep apnea   . Arthritis    Prior to Admission medications   Medication Sig Start Date End Date Taking? Authorizing Provider  Acetaminophen (MAPAP) 500 MG coapsule Take by mouth.   Yes Historical Provider, MD  buPROPion (WELLBUTRIN XL) 300 MG 24 hr tablet Take 300 mg by mouth daily.   Yes Historical Provider, MD  clonazePAM (KLONOPIN) 1 MG tablet Take 1 mg by mouth at bedtime.   Yes Historical Provider, MD  diclofenac (CATAFLAM) 50 MG tablet Take 75 mg by mouth 2 (two) times daily.   Yes Historical Provider, MD  DULoxetine (CYMBALTA) 60 MG capsule Take 60 mg by mouth daily.   Yes Historical Provider, MD  metFORMIN (GLUCOPHAGE) 500 MG tablet Take 500 mg by mouth daily with breakfast.   Yes Historical Provider, MD  Multiple Vitamins-Minerals (MULTIVITAMIN PO) Take by mouth.   Yes Historical Provider, MD  naproxen (NAPROSYN) 500 MG tablet Take 1 tablet (500 mg total) by mouth 2 (two) times daily with a meal. 05/28/12  Yes Ripudeep K Rai, MD  albuterol (PROVENTIL HFA;VENTOLIN HFA) 108 (90 BASE) MCG/ACT inhaler Inhale 2 puffs into the lungs every 6 (six) hours as needed for shortness of breath.  05/28/12   Ripudeep Jenna Luo, MD  Lancets (FREESTYLE) lancets Use as instructed 05/28/12   Ripudeep Jenna Luo, MD   Family History  Problem Relation Age of Onset  . Diabetes Mother   . Stroke Mother   . Hypertension Mother   . Cancer Sister    History   Social History  . Marital Status: Single    Spouse Name: N/A    Number of Children: N/A  . Years of Education: N/A   Occupational History  . Not on file.   Social History Main Topics  . Smoking status: Current Every Day Smoker -- 1.00 packs/day    Types: Cigarettes  . Smokeless tobacco: Never Used  . Alcohol Use: No  . Drug Use: Not on file  . Sexually Active: Not on file   Other Topics Concern  . Not on file   Social History Narrative       Review of Systems ______ Constitutional: Negative for fever, chills, diaphoresis, activity change, appetite change and fatigue. ____ HENT: Negative for ear pain, nosebleeds, congestion, facial swelling, rhinorrhea, neck pain, neck stiffness and ear discharge.  ____ Eyes: Negative for pain, discharge, redness, itching and visual disturbance. ____ Respiratory: Negative for cough, choking, chest tightness, shortness of breath, wheezing and stridor.  ____ Cardiovascular: Negative for chest  pain, palpitations and leg swelling. ____ Gastrointestinal: Negative for Nausea/ Vomiting/ Diarrhea or Consitpation Genitourinary: Negative for dysuria, urgency, frequency, hematuria, flank pain, decreased urine volume, difficulty urinating and dyspareunia. ____ Musculoskeletal: Negative for back pain, positive for joint pain and gait problem. ________ Neurological: Negative for dizziness, tremors, seizures, syncope, facial asymmetry, speech difficulty, weakness, light-headedness, numbness and headaches. ____ Hematological: Negative for adenopathy. Does not bruise/bleed easily. ____ Psychiatric/Behavioral: Negative for hallucinations, behavioral problems, confusion, dysphoric mood, decreased concentration and  agitation. ______   Objective:   Filed Vitals:   07/31/12 1514  BP: 155/106  Pulse: 74  Temp: 98 F (36.7 C)  Resp: 16    Physical Exam ______ Constitutional: Obese, no distress HENT: Normocephalic. External right and left ear normal. Oropharynx is clear and moist. ____ Eyes: Conjunctivae and EOM are normal. PERRLA, no scleral icterus. ____ Neck: Normal ROM. Neck supple. No JVD. No tracheal deviation. No thyromegaly. ____ CVS: RRR, S1/S2 +, no murmurs, no gallops, no carotid bruit.  Pulmonary: Effort and breath sounds normal, no stridor, rhonchi, wheezes, rales.  Abdominal: Soft. BS +,  no distension, tenderness, rebound or guarding. ________ Musculoskeletal: Normal range of motion. No edema and no tenderness. ____ Lymphadenopathy: No lymphadenopathy noted, cervical, inguinal. Neuro: Alert. Normal reflexes, muscle tone coordination. No cranial nerve deficit. Skin: Skin is warm and dry. No rash noted. Not diaphoretic. No erythema. No pallor. ____ Psychiatric: Normal mood and affect. Behavior, judgment, thought content normal. __  Lab Results  Component Value Date   HGB 13.3 05/09/2010   HCT 39.0 05/09/2010   Lab Results  Component Value Date   CREATININE 1.0 05/09/2010   BUN 5* 05/09/2010   NA 138 05/09/2010   K 3.4* 05/09/2010   CL 102 05/09/2010    No results found for this basename: HGBA1C   Lipid Panel  No results found for this basename: chol,  trig,  hdl,  cholhdl,  vldl,  ldlcalc       Assessment and plan:   Patient Active Problem List   Diagnosis Date Noted  . Arthritis 07/31/2012  . Morbid obesity 05/28/2012  . Type II or unspecified type diabetes mellitus without mention of complication, not stated as uncontrolled 05/28/2012  . Osteoarthritis of left and right knee 05/28/2012  . Generalized anxiety disorder 05/28/2012    Referral to Weight loss clinic Arthritis- increase Naprosyn to 500 MG TID- STOP Diclofenac DM- check A1c- need prescription for  meter  Mammogram: done this year Pap Smear : done recently 07/04/2012 LABS ordered: Metabolic panel: CBC:  Vitamin D : TSH: A1c     Come back in 2 wks for Lipid profile

## 2012-07-31 NOTE — Progress Notes (Signed)
Patient here for follow up Would like x ray results Has brown spots on face

## 2012-08-01 LAB — CBC WITH DIFFERENTIAL/PLATELET
Basophils Absolute: 0 10*3/uL (ref 0.0–0.1)
Lymphocytes Relative: 38 % (ref 12–46)
Neutro Abs: 5.5 10*3/uL (ref 1.7–7.7)
Platelets: 326 10*3/uL (ref 150–400)
RDW: 14.7 % (ref 11.5–15.5)
WBC: 10.8 10*3/uL — ABNORMAL HIGH (ref 4.0–10.5)

## 2012-08-01 LAB — BASIC METABOLIC PANEL
Chloride: 98 mEq/L (ref 96–112)
Creat: 0.8 mg/dL (ref 0.50–1.10)

## 2012-08-01 LAB — HEMOGLOBIN A1C
Hgb A1c MFr Bld: 7.8 % — ABNORMAL HIGH (ref <5.7)
Mean Plasma Glucose: 177 mg/dL — ABNORMAL HIGH (ref <117)

## 2012-08-08 ENCOUNTER — Telehealth: Payer: Self-pay | Admitting: *Deleted

## 2012-08-08 NOTE — Telephone Encounter (Signed)
08/08/12 Spoke with patient and made aware  A1c was elevated patient instructed to start  A low carb diet. Appointment schedule for 08/15/12 P[facility]

## 2012-08-15 ENCOUNTER — Ambulatory Visit: Payer: Medicaid Other | Attending: Family Medicine | Admitting: Family Medicine

## 2012-08-15 ENCOUNTER — Encounter: Payer: Self-pay | Admitting: Family Medicine

## 2012-08-15 VITALS — BP 118/80 | HR 85 | Temp 97.9°F | Ht 66.0 in | Wt 368.0 lb

## 2012-08-15 DIAGNOSIS — E119 Type 2 diabetes mellitus without complications: Secondary | ICD-10-CM

## 2012-08-15 DIAGNOSIS — M129 Arthropathy, unspecified: Secondary | ICD-10-CM

## 2012-08-15 DIAGNOSIS — F411 Generalized anxiety disorder: Secondary | ICD-10-CM

## 2012-08-15 DIAGNOSIS — M25569 Pain in unspecified knee: Secondary | ICD-10-CM | POA: Insufficient documentation

## 2012-08-15 DIAGNOSIS — M1990 Unspecified osteoarthritis, unspecified site: Secondary | ICD-10-CM

## 2012-08-15 DIAGNOSIS — M1710 Unilateral primary osteoarthritis, unspecified knee: Secondary | ICD-10-CM

## 2012-08-15 DIAGNOSIS — M1712 Unilateral primary osteoarthritis, left knee: Secondary | ICD-10-CM

## 2012-08-15 LAB — LIPID PANEL
Cholesterol: 127 mg/dL (ref 0–200)
LDL Cholesterol: 72 mg/dL (ref 0–99)
VLDL: 21 mg/dL (ref 0–40)

## 2012-08-15 MED ORDER — NICOTINE 21 MG/24HR TD PT24: 1.0000 | MEDICATED_PATCH | TRANSDERMAL | Status: DC

## 2012-08-15 MED ORDER — METFORMIN HCL ER 500 MG PO TB24: 1000.0000 mg | ORAL_TABLET | Freq: Two times a day (BID) | ORAL | Status: DC

## 2012-08-15 NOTE — Progress Notes (Signed)
Patient ID: Tara Keller, female   DOB: [birth year], 47 y.o.   MRN: [id number]  CC:  Follow up   HPI: Pt is now following up and reporting that she has lost 10 pounds since her last visit.  She is having less pain in the knees and reports that BS readings have been less than 100.    No Known Allergies Past Medical History  Diagnosis Date  . Diabetes mellitus   . Asthma   . Hypertension   . Obesity   . Sleep apnea   . Arthritis   . H/O tubal ligation    Current Outpatient Prescriptions on File Prior to Visit  Medication Sig Dispense Refill  . Acetaminophen (MAPAP) 500 MG coapsule Take by mouth.      [name] albuterol (PROVENTIL HFA;VENTOLIN HFA) 108 (90 BASE) MCG/ACT inhaler Inhale 2 puffs into the lungs every 6 (six) hours as needed for shortness of breath.  8.5 g  3  . buPROPion (WELLBUTRIN XL) 300 MG 24 hr tablet Take 300 mg by mouth daily.      . clonazePAM (KLONOPIN) 1 MG tablet Take 1 mg by mouth at bedtime.      . DULoxetine (CYMBALTA) 60 MG capsule Take 60 mg by mouth daily.      [name] glucose monitoring kit (FREESTYLE) monitoring kit 1 each by Does not apply route as needed for other. Dispense any model that is covered- dispense testing supplies for Q AC/ HS accuchecks- 1 month supply with one refil.  1 each  1  . Lancets (FREESTYLE) lancets Use as instructed  100 each  12  . metFORMIN (GLUCOPHAGE) 500 MG tablet Take 1 tablet (500 mg total) by mouth daily with breakfast.  30 tablet  0  . Multiple Vitamins-Minerals (MULTIVITAMIN PO) Take by mouth.      . naproxen (NAPROSYN) 500 MG tablet Take 1 tablet (500 mg total) by mouth 3 (three) times daily with meals.  60 tablet  3   No current facility-administered medications on file prior to visit.   Family History  Problem Relation Age of Onset  . Diabetes Mother   . Stroke Mother   . Hypertension Mother   . Cancer Sister    History   Social History  . Marital Status: Single    Spouse Name: N/A    Number of Children: N/A  . Years  of Education: N/A   Occupational History  . Not on file.   Social History Main Topics  . Smoking status: Current Every Day Smoker -- 1.00 packs/day    Types: Cigarettes  . Smokeless tobacco: Never Used  . Alcohol Use: No  . Drug Use: Not on file  . Sexually Active: Not on file   Other Topics Concern  . Not on file   Social History Narrative  . No narrative on file    Review of Systems  Constitutional: Negative for fever, chills, diaphoresis, activity change, appetite change and fatigue.  HENT: Negative for ear pain, nosebleeds, congestion, facial swelling, rhinorrhea, neck pain, neck stiffness and ear discharge.   Eyes: Negative for pain, discharge, redness, itching and visual disturbance.  Respiratory: Negative for cough, choking, chest tightness, shortness of breath, wheezing and stridor.   Cardiovascular: Negative for chest pain, palpitations and leg swelling.  Gastrointestinal: Negative for abdominal distention.  Genitourinary: Negative for dysuria, urgency, frequency, hematuria, flank pain, decreased urine volume, difficulty urinating and dyspareunia.  Musculoskeletal: Negative for back pain, joint swelling, arthralgias and gait problem.  Neurological:  Negative for dizziness, tremors, seizures, syncope, facial asymmetry, speech difficulty, weakness, light-headedness, numbness and headaches.  Hematological: Negative for adenopathy. Does not bruise/bleed easily.  Psychiatric/Behavioral: Negative for hallucinations, behavioral problems, confusion, dysphoric mood, decreased concentration and agitation.    Objective:   Filed Vitals:   08/15/12 1034  BP: 118/80  Pulse: 85  Temp: 97.9 F (36.6 C)    Physical Exam  Constitutional: Appears well-developed and well-nourished. No distress.  HENT: Normocephalic. External right and left ear normal. Oropharynx is clear and moist.  Eyes: Conjunctivae and EOM are normal. PERRLA, no scleral icterus.  Neck: Normal ROM. Neck supple.  No JVD. No tracheal deviation. No thyromegaly.  CVS: RRR, S1/S2 +, no murmurs, no gallops, no carotid bruit.  Pulmonary: Effort and breath sounds normal, no stridor, rhonchi, wheezes, rales.  Abdominal: Soft. BS +,  no distension, tenderness, rebound or guarding.  Musculoskeletal: Normal range of motion. No edema and no tenderness.  Lymphadenopathy: No lymphadenopathy noted, cervical, inguinal. Neuro: Alert. Normal reflexes, muscle tone coordination. No cranial nerve deficit. Skin: Skin is warm and dry. No rash noted. Not diaphoretic. No erythema. No pallor.  Psychiatric: Normal mood and affect. Behavior, judgment, thought content normal.   Lab Results  Component Value Date   WBC 10.8* 07/31/2012   HGB 12.4 07/31/2012   HCT 38.1 07/31/2012   MCV 81.9 07/31/2012   PLT 326 07/31/2012   Lab Results  Component Value Date   CREATININE 0.80 07/31/2012   BUN 10 07/31/2012   NA 137 07/31/2012   K 4.0 07/31/2012   CL 98 07/31/2012   CO2 29 07/31/2012    Lab Results  Component Value Date   HGBA1C 7.8* 07/31/2012   Lipid Panel  No results found for this basename: chol, trig, hdl, cholhdl, vldl, ldlcalc       Assessment and plan:   Patient Active Problem List   Diagnosis Date Noted  . Arthritis 07/31/2012  . Morbid obesity 05/28/2012  . Type II or unspecified type diabetes mellitus without mention of complication, not stated as uncontrolled 05/28/2012  . Osteoarthritis of left and right knee 05/28/2012  . Generalized anxiety disorder 05/28/2012   The patient was counseled on the dangers of tobacco use, and was advised to quit.  Reviewed strategies to maximize success, including removing cigarettes and smoking materials from environment and stress management.  Pt wants to try nicotine patches.   Increase metformin to 1000 mg po BID with meals.   Check labs today  Follow results  rtc in 3 months  The patient was given clear instructions to go to ER or return to medical center if  symptoms don't improve, worsen or new problems develop.  The patient verbalized understanding.  The patient was told to call to get any lab results if not heard anything in the next week.    Rodney Langton, MD, CDE, FAAFP [facility] [place], [place]

## 2012-08-15 NOTE — Patient Instructions (Addendum)
Osteoarthritis Osteoarthritis is the most common form of arthritis. It is redness, soreness, and swelling (inflammation) affecting the cartilage. Cartilage acts as a cushion, covering the ends of bones where they meet to form a joint. CAUSES  Over time, the cartilage begins to wear away. This causes bone to rub on bone. This produces pain and stiffness in the affected joints. Factors that contribute to this problem are:  Excessive body weight.  Age.  Overuse of joints. SYMPTOMS   People with osteoarthritis usually experience joint pain, swelling, or stiffness.  Over time, the joint may lose its normal shape.  Small deposits of bone (osteophytes) may grow on the edges of the joint.  Bits of bone or cartilage can break off and float inside the joint space. This may cause more pain and damage.  Osteoarthritis can lead to depression, anxiety, feelings of helplessness, and limitations on daily activities. The most commonly affected joints are in the:  Ends of the fingers.  Thumbs.  Neck.  Lower back.  Knees.  Hips. DIAGNOSIS  Diagnosis is mostly based on your symptoms and exam. Tests may be helpful, including:  X-rays of the affected joint.  A computerized magnetic scan (MRI).  Blood tests to rule out other types of arthritis.  Joint fluid tests. This involves using a needle to draw fluid from the joint and examining the fluid under a microscope. TREATMENT  Goals of treatment are to control pain, improve joint function, maintain a normal body weight, and maintain a healthy lifestyle. Treatment approaches may include:  A prescribed exercise program with rest and joint relief.  Weight control with nutritional education.  Pain relief techniques such as:  Properly applied heat and cold.  Electric pulses delivered to nerve endings under the skin (transcutaneous electrical nerve stimulation, TENS).  Massage.  Certain supplements. Ask your caregiver before using any  supplements, especially in combination with prescribed drugs.  Medicines to control pain, such as:  Acetaminophen.  Nonsteroidal anti-inflammatory drugs (NSAIDs), such as naproxen.  Narcotic or central-acting agents, such as tramadol. This drug carries a risk of addiction and is generally prescribed for short-term use.  Corticosteroids. These can be given orally or as injection. This is a short-term treatment, not recommended for routine use.  Surgery to reposition the bones and relieve pain (osteotomy) or to remove loose pieces of bone and cartilage. Joint replacement may be needed in advanced states of osteoarthritis. HOME CARE INSTRUCTIONS  Your caregiver can recommend specific types of exercise. These may include:  Strengthening exercises. These are done to strengthen the muscles that support joints affected by arthritis. They can be performed with weights or with exercise bands to add resistance.  Aerobic activities. These are exercises, such as brisk walking or low-impact aerobics, that get your heart pumping. They can help keep your lungs and circulatory system in shape.  Range-of-motion activities. These keep your joints limber.  Balance and agility exercises. These help you maintain daily living skills. Learning about your condition and being actively involved in your care will help improve the course of your osteoarthritis. SEEK MEDICAL CARE IF:   You feel hot or your skin turns red.  You develop a rash in addition to your joint pain.  You have an oral temperature above 102 F (38.9 C). FOR MORE INFORMATION  National Institute of Arthritis and Musculoskeletal and Skin Diseases: www.niams.http://www.myers.net/ [employer] on Aging: https://walker.com/ American College of Rheumatology: www.rheumatology.org Document Released: 12/26/2004 Document Revised: 03/20/2011 Document Reviewed: 04/08/2009 [facility] Patient Information 2014 [place], [place].  Blood Sugar Monitoring,  Adult GLUCOSE METERS FOR SELF-MONITORING OF BLOOD GLUCOSE  It is important to be able to correctly measure your blood sugar (glucose). You can use a blood glucose monitor (a small battery-operated device) to check your glucose level at any time. This allows you and your caregiver to monitor your diabetes and to determine how well your treatment plan is working. The process of monitoring your blood glucose with a glucose meter is called self-monitoring of blood glucose (SMBG). When people with diabetes control their blood sugar, they have better health. To test for glucose with a typical glucose meter, place the disposable strip in the meter. Then place a small sample of blood on the "test strip." The test strip is coated with chemicals that combine with glucose in blood. The meter measures how much glucose is present. The meter displays the glucose level as a number. Several new models can record and store a number of test results. Some models can connect to personal computers to store test results or print them out.  Newer meters are often easier to use than older models. Some meters allow you to get blood from places other than your fingertip. Some new models have automatic timing, error codes, signals, or barcode readers to help with proper adjustment (calibration). Some meters have a large display screen or spoken instructions for people with visual impairments.  INSTRUCTIONS FOR USING GLUCOSE METERS  Wash your hands with soap and warm water, or clean the area with alcohol. Dry your hands completely.  Prick the side of your fingertip with a lancet (a sharp-pointed tool used by hand).  Hold the hand down and gently milk the finger until a small drop of blood appears. Catch the blood with the test strip.  Follow the instructions for inserting the test strip and using the SMBG meter. Most meters require the meter to be turned on and the test strip to be inserted before applying the blood  sample.  Record the test result.  Read the instructions carefully for both the meter and the test strips that go with it. Meter instructions are found in the user manual. Keep this manual to help you solve any problems that may arise. Many meters use "error codes" when there is a problem with the meter, the test strip, or the blood sample on the strip. You will need the manual to understand these error codes and fix the problem.  New devices are available such as laser lancets and meters that can test blood taken from "alternative sites" of the body, other than fingertips. However, you should use standard fingertip testing if your glucose changes rapidly. Also, use standard testing if:  You have eaten, exercised, or taken insulin in the past 2 hours.  You think your glucose is low.  You tend to not feel symptoms of low blood glucose (hypoglycemia).  You are ill or under stress.  Clean the meter as directed by the manufacturer.  Test the meter for accuracy as directed by the manufacturer.  Take your meter with you to your caregiver's office. This way, you can test your glucose in front of your caregiver to make sure you are using the meter correctly. Your caregiver can also take a sample of blood to test using a routine lab method. If values on the glucose meter are close to the lab results, you and your caregiver will see that your meter is working well and you are using good technique. Your caregiver will advise you about  what to do if the results do not match. FREQUENCY OF TESTING  Your caregiver will tell you how often you should check your blood glucose. This will depend on your type of diabetes, your current level of diabetes control, and your types of medicines. The following are general guidelines, but your care plan may be different. Record all your readings and the time of day you took them for review with your caregiver.   Diabetes type 1.  When you are using insulin with good  diabetic control (either multiple daily injections or via a pump), you should check your glucose 4 times a day.  If your diabetes is not well controlled, you may need to monitor more frequently, including before meals and 2 hours after meals, at bedtime, and occasionally between 2 a.m. and 3 a.m.  You should always check your glucose before a dose of insulin or before changing the rate on your insulin pump.  Diabetes type 2.  Guidelines for SMBG in diabetes type 2 are not as well defined.  If you are on insulin, follow the guidelines above.  If you are on medicines, but not insulin, and your glucose is not well controlled, you should test at least twice daily.  If you are not on insulin, and your diabetes is controlled with medicines or diet alone, you should test at least once daily, usually before breakfast.  A weekly profile will help your caregiver advise you on your care plan. The week before your visit, check your glucose before a meal and 2 hours after a meal at least daily. You may want to test before and after a different meal each day so you and your caregiver can tell how well controlled your blood sugars are throughout the course of a 24 hour period.  Gestational diabetes (diabetes during pregnancy).  Frequent testing is often necessary. Accurate timing is important.  If you are not on insulin, check your glucose 4 times a day. Check it before breakfast and 1 hour after the start of each meal.  If you are on insulin, check your glucose 6 times a day. Check it before each meal and 1 hour after the first bite of each meal.  General guidelines.  More frequent testing is required at the start of insulin treatment. Your caregiver will instruct you.  Test your glucose any time you suspect you have low blood sugar (hypoglycemia).  You should test more often when you change medicines, when you have unusual stress or illness, or in other unusual circumstances. OTHER THINGS TO KNOW  ABOUT GLUCOSE METERS  Measurement Range. Most glucose meters are able to read glucose levels over a broad range of values from as low as 0 to as high as 600 mg/dL. If you get an extremely high or low reading from your meter, you should first confirm it with another reading. Report very high or very low readings to your caregiver.  Whole Blood Glucose versus Plasma Glucose. Some older home glucose meters measure glucose in your whole blood. In a lab or when using some newer home glucose meters, the glucose is measured in your plasma (one component of blood). The difference can be important. It is important for you and your caregiver to know whether your meter gives its results as "whole blood equivalent" or "plasma equivalent."  Display of High and Low Glucose Values. Part of learning how to operate a meter is understanding what the meter results mean. Know how high and low glucose concentrations are  displayed on your meter.  Factors that Affect Glucose Meter Performance. The accuracy of your test results depends on many factors and varies depending on the brand and type of meter. These factors include:  Low red blood cell count (anemia).  Substances in your blood (such as uric acid, vitamin C, and others).  Environmental factors (temperature, humidity, altitude).  Name-brand versus generic test strips.  Calibration. Make sure your meter is set up properly. It is a good idea to do a calibration test with a control solution recommended by the manufacturer of your meter whenever you begin using a fresh bottle of test strips. This will help verify the accuracy of your meter.  Improperly stored, expired, or defective test strips. Keep your strips in a dry place with the lid on.  Soiled meter.  Inadequate blood sample. NEW TECHNOLOGIES FOR GLUCOSE TESTING Alternative site testing Some glucose meters allow testing blood from alternative sites. These include the:  Upper arm.  Forearm.  Base  of the thumb.  Thigh. Sampling blood from alternative sites may be desirable. However, it may have some limitations. Blood in the fingertips show changes in glucose levels more quickly than blood in other parts of the body. This means that alternative site test results may be different from fingertip test results, not because of the meter's ability to test accurately, but because the actual glucose concentration can be different.  Continuous Glucose Monitoring Devices to measure your blood glucose continuously are available, and others are in development. These methods can be more expensive than self-monitoring with a glucose meter. However, it is uncertain how effective and reliable these devices are. Your caregiver will advise you if this approach makes sense for you. IF BLOOD SUGARS ARE CONTROLLED, PEOPLE WITH DIABETES REMAIN HEALTHIER.  SMBG is an important part of the treatment plan of patients with diabetes mellitus. Below are reasons for using SMBG:   It confirms that your glucose is at a specific, healthy level.  It detects hypoglycemia and severe hyperglycemia.  It allows you and your caregiver to make adjustments in response to changes in lifestyle for individuals requiring medicine.  It determines the need for starting insulin therapy in temporary diabetes that happens during pregnancy (gestational diabetes). Document Released: 12/29/2002 Document Revised: 03/20/2011 Document Reviewed: 04/21/2010 [facility] Patient Information 2014 [place], [place].

## 2012-08-16 ENCOUNTER — Telehealth: Payer: Self-pay

## 2012-08-16 NOTE — Telephone Encounter (Signed)
Message copied by Lestine Mount on Fri Aug 16, 2012  1:31 PM ------      Message from: Cleora Fleet      Created: Fri Aug 16, 2012 12:16 PM       Please inform patient that cholesterol panel came back OK.             Rodney Langton, MD, CDE, FAAFP      [facility]      [facility]      [place], [place]        ------

## 2012-08-16 NOTE — Telephone Encounter (Signed)
Patient is aware of her lab results 

## 2012-08-16 NOTE — Progress Notes (Signed)
Quick Note:  Please inform patient that cholesterol panel came back OK.   Rodney Langton, MD, CDE, FAAFP [facility] [place], [place]   ______

## 2012-08-28 ENCOUNTER — Ambulatory Visit: Payer: Medicaid Other | Attending: Internal Medicine | Admitting: Internal Medicine

## 2012-08-28 MED ORDER — MICONAZOLE NITRATE 2 % EX CREA: TOPICAL_CREAM | Freq: Two times a day (BID) | CUTANEOUS | Status: DC

## 2012-08-28 MED ORDER — DOXYCYCLINE HYCLATE 50 MG PO CAPS: 100.0000 mg | ORAL_CAPSULE | Freq: Two times a day (BID) | ORAL | Status: AC

## 2012-08-28 MED ORDER — DOXYCYCLINE HYCLATE 50 MG PO CAPS: 50.0000 mg | ORAL_CAPSULE | Freq: Two times a day (BID) | ORAL | Status: DC

## 2012-08-28 MED ORDER — NICOTINE 21 MG/24HR TD PT24: 1.0000 | MEDICATED_PATCH | TRANSDERMAL | Status: DC

## 2012-08-28 NOTE — Progress Notes (Unsigned)
Patient complains of having some type of "bump"  Between her buttocks States they itch break open and burn

## 2012-08-28 NOTE — Progress Notes (Unsigned)
Patient ID: Tara Keller, female   DOB: 01-30-1965, 47 y.o.   MRN: [id number]  CC:  HPI:  47 year old female who presents to the clinic for evaluation of a pustular rash in her gluteal cleft. The patient has noticed this rash for about 10 days. It is itchy occasionally it will open up and started draining. The patient has been applying his regular cream with no relief. She denies any fever she denies any difficulty with her BM. No history of anal fissures or hemorrhoids.   No Known Allergies Past Medical History  Diagnosis Date  . Diabetes mellitus   . Asthma   . Hypertension   . Obesity   . Sleep apnea   . Arthritis   . H/O tubal ligation    Current Outpatient Prescriptions on File Prior to Visit  Medication Sig Dispense Refill  . Acetaminophen (MAPAP) 500 MG coapsule Take by mouth.      [name] albuterol (PROVENTIL HFA;VENTOLIN HFA) 108 (90 BASE) MCG/ACT inhaler Inhale 2 puffs into the lungs every 6 (six) hours as needed for shortness of breath.  8.5 g  3  . buPROPion (WELLBUTRIN XL) 300 MG 24 hr tablet Take 300 mg by mouth daily.      . clonazePAM (KLONOPIN) 1 MG tablet Take 1 mg by mouth at bedtime.      . DULoxetine (CYMBALTA) 60 MG capsule Take 60 mg by mouth daily.      [name] glucose monitoring kit (FREESTYLE) monitoring kit 1 each by Does not apply route as needed for other. Dispense any model that is covered- dispense testing supplies for Q AC/ HS accuchecks- 1 month supply with one refil.  1 each  1  . Lancets (FREESTYLE) lancets Use as instructed  100 each  12  . metFORMIN (GLUCOPHAGE XR) 500 MG 24 hr tablet Take 2 tablets (1,000 mg total) by mouth 2 (two) times daily with a meal.  120 tablet  3  . Multiple Vitamins-Minerals (MULTIVITAMIN PO) Take by mouth.      . naproxen (NAPROSYN) 500 MG tablet Take 1 tablet (500 mg total) by mouth 3 (three) times daily with meals.  60 tablet  3   No current facility-administered medications on file prior to visit.   Family History  Problem  Relation Age of Onset  . Diabetes Mother   . Stroke Mother   . Hypertension Mother   . Cancer Sister    History   Social History  . Marital Status: Single    Spouse Name: N/A    Number of Children: N/A  . Years of Education: N/A   Occupational History  . Not on file.   Social History Main Topics  . Smoking status: Current Every Day Smoker -- 1.00 packs/day    Types: Cigarettes  . Smokeless tobacco: Never Used  . Alcohol Use: No  . Drug Use: Not on file  . Sexual Activity: Not on file   Other Topics Concern  . Not on file   Social History Narrative  . No narrative on file    Review of Systems  Constitutional: Negative for fever, chills, diaphoresis, activity change, appetite change and fatigue.  HENT: Negative for ear pain, nosebleeds, congestion, facial swelling, rhinorrhea, neck pain, neck stiffness and ear discharge.   Eyes: Negative for pain, discharge, redness, itching and visual disturbance.  Respiratory: Negative for cough, choking, chest tightness, shortness of breath, wheezing and stridor.   Cardiovascular: Negative for chest pain, palpitations and leg swelling.  Gastrointestinal: Negative  for abdominal distention.  Genitourinary: Negative for dysuria, urgency, frequency, hematuria, flank pain, decreased urine volume, difficulty urinating and dyspareunia.  Musculoskeletal: Negative for back pain, joint swelling, arthralgias and gait problem.  Neurological: Negative for dizziness, tremors, seizures, syncope, facial asymmetry, speech difficulty, weakness, light-headedness, numbness and headaches.  Hematological: Negative for adenopathy. Does not bruise/bleed easily.  Psychiatric/Behavioral: Negative for hallucinations, behavioral problems, confusion, dysphoric mood, decreased concentration and agitation.    Objective:   Filed Vitals:   08/28/12 1617  BP: 135/85  Pulse: 83  Temp: 99 F (37.2 C)  Resp: 17    Physical Exam  Constitutional: Appears  well-developed and well-nourished. No distress.  HENT: Normocephalic. External right and left ear normal. Oropharynx is clear and moist.  Eyes: Conjunctivae and EOM are normal. PERRLA, no scleral icterus.  Neck: Normal ROM. Neck supple. No JVD. No tracheal deviation. No thyromegaly.  CVS: RRR, S1/S2 +, no murmurs, no gallops, no carotid bruit.  Pulmonary: Effort and breath sounds normal, no stridor, rhonchi, wheezes, rales.  Abdominal: Soft. BS +,  no distension, tenderness, rebound or guarding.  Musculoskeletal: Normal range of motion. No edema and no tenderness.  Lymphadenopathy: No lymphadenopathy noted, cervical, inguinal. Neuro: Alert. Normal reflexes, muscle tone coordination. No cranial nerve deficit. Skin: Skin is warm and dry. No rash noted. Not diaphoretic. No erythema. No pallor.  Psychiatric: Normal mood and affect. Behavior, judgment, thought content normal.   Lab Results  Component Value Date   WBC 10.8* 07/31/2012   HGB 12.4 07/31/2012   HCT 38.1 07/31/2012   MCV 81.9 07/31/2012   PLT 326 07/31/2012   Lab Results  Component Value Date   CREATININE 0.80 07/31/2012   BUN 10 07/31/2012   NA 137 07/31/2012   K 4.0 07/31/2012   CL 98 07/31/2012   CO2 29 07/31/2012    Lab Results  Component Value Date   HGBA1C 7.8* 07/31/2012   Lipid Panel     Component Value Date/Time   CHOL 127 08/15/2012 1058   TRIG 105 08/15/2012 1058   HDL 34* 08/15/2012 1058   CHOLHDL 3.7 08/15/2012 1058   VLDL 21 08/15/2012 1058   LDLCALC 72 08/15/2012 1058       Assessment and plan:   Patient Active Problem List   Diagnosis Date Noted  . Arthritis 07/31/2012  . Morbid obesity 05/28/2012  . Type II or unspecified type diabetes mellitus without mention of complication, not stated as uncontrolled 05/28/2012  . Osteoarthritis of left and right knee 05/28/2012  . Generalized anxiety disorder 05/28/2012        Multiple areas of folliculitis in the gluteal cleft. Patient will be prescribed doxycycline  100 mg by mouth twice a day for 7 days Will prescribe miconazole cream to be applied topically twice a day   Nicotine dependence Patient requesting nicotine patches and will be provided  Followup in 2 weeks

## 2012-09-10 ENCOUNTER — Ambulatory Visit (INDEPENDENT_AMBULATORY_CARE_PROVIDER_SITE_OTHER): Payer: Medicaid Other | Admitting: Family Medicine

## 2012-09-10 VITALS — BP 126/84 | Ht 66.0 in | Wt 378.0 lb

## 2012-09-10 DIAGNOSIS — M25561 Pain in right knee: Secondary | ICD-10-CM

## 2012-09-10 DIAGNOSIS — M25569 Pain in unspecified knee: Secondary | ICD-10-CM

## 2012-09-10 DIAGNOSIS — M1711 Unilateral primary osteoarthritis, right knee: Secondary | ICD-10-CM

## 2012-09-10 DIAGNOSIS — M1712 Unilateral primary osteoarthritis, left knee: Secondary | ICD-10-CM

## 2012-09-10 DIAGNOSIS — M1710 Unilateral primary osteoarthritis, unspecified knee: Secondary | ICD-10-CM

## 2012-09-10 MED ORDER — METHYLPREDNISOLONE ACETATE 40 MG/ML IJ SUSP
40.0000 mg | Freq: Once | INTRAMUSCULAR | Status: AC
  Administered 2012-09-10: 40 mg via INTRA_ARTICULAR

## 2012-09-10 MED ORDER — MELOXICAM 15 MG PO TABS: 15.0000 mg | ORAL_TABLET | Freq: Every day | ORAL | Status: DC

## 2012-09-10 NOTE — Patient Instructions (Addendum)
Thank you for coming in today  I think that you have a flare of arthritis in your knees  1. Take meloxicam 1 pill daily in the morning with breakfast for 2 weeks, then as needed 2. Ice your knees for 15-20 minutes 3 x per day 3. [facility] and do water exercise or stationary bike 4. We will inject steroid medicine into your knees today  Followup in 2 weeks.

## 2012-09-10 NOTE — Progress Notes (Signed)
CC: Bilateral knee pain HPI: Patient is a pleasant 47 year old morbidly obese female who presents with bilateral knee pain x1 month. She states that both knees hurt equally bad. She states that the pain first started about a month ago in late July when she and rolled in the thirty-day challenge and started to increase her exercise and walking. She is trying really hard to lose weight. However her pain has continued to increase and she now has constant pain that keeps her up at night. The pain is all over the anterior aspect of the both knees. She states her knees feel warmer. Her pain is worsened by prolonged standing and improved with sitting down. She has had swelling of the knees. She perceives tightness of the knees. She has had to stop all exercise due to her pain. She is currently taking ibuprofen 800 mg once daily as well as Tylenol and muscle relaxer. Her primary care doctor ordered x-rays of her knees which she says was done standing. She is miserable and in extreme pain. She has not previously had problems with her knees.  ROS: As above in the HPI. All other systems are stable or negative.  PMH: Diabetes mellitus type 2, osteoarthritis , anxiety, morbid obesity PSH: Cesarean section  Social: Patient is disabled. She does smoke cigarettes but has recently started the nicotine patch in an attempt to quit smoking. She denies alcohol or illicit drug use. Family: Positive for breast cancer, diabetes, lung cancer, heart disease, arthritis  Allergies: No known drug allergies    OBJECTIVE: APPEARANCE:  Patient in no mild distress. she is tearful. The patient appeared well nourished and normally developed. HEENT: No scleral icterus. Conjunctiva non-injected Resp: Non labored Skin: No rash MSK:  Bilateral knees: There is no obvious swelling or deformity. Patient has extreme tenderness to palpation over the medial joint lines bilaterally. She has pain with full extension or flexion. Strength is 5  out of 5 in knee flexion extension bilaterally. No redness or warmth on palpation. Pain on patellar compression bilaterally. Varus, valgus, and McMurray's created pain but examination was difficult because patient had exquisite pain with any movement of the knee. No obvious laxity on exam. Gait is antalgic .  Radiographs: 4 view x-rays of the bilateral knees were personally reviewed today. These osteophytes development and subchondral sclerosis worst in the medial compartment and patellofemoral compartment.  ASSESSMENT: #1. Bilateral knee pain do to osteoarthritis flare in the setting of moderate to severe degenerative change and morbid obesity   PLAN: Based on the extreme amount of discomfort the patient is in today, recommended corticosteroid injection into the bilateral knees. After further discussion patient did agree to this. Risks and benefits were discussed including risk of infection, bleeding, damage to surrounding structures. Patient will also be started on meloxicam 15 mg daily to take for 2 weeks and then as needed. I have asked her to ice her knees 3 times per day for 15-20 minutes. I discussed the importance of weight loss with this patient given her young age and degenerative change. Recommended that she do nonimpact activities such as stationary bicycle or water exercise. Recommended that she have a visit with a nutritionist to work on dietary change. She will followup with [name] in 2 weeks to reevaluate her knee pain. My hope is that she will have drastic improvement with a corticosteroid injection.  Right knee injection: Consent obtained and verified.  Time-out conducted.  Noted no overlying erythema, induration, or other signs of local infection.  Skin prepped in a sterile fashion.  Topical analgesic spray: Ethyl chloride.  Joint: Right Knee with anterolateral approach Needle: 25 gauge 1 1/2 inch Completed without difficulty.  Meds: 3 cc 1% lidocaine, 1 cc depomedrol Advised to  call if fevers/chills, erythema, induration, drainage, or persistent bleeding.  Left knee injection: Consent obtained and verified.  Time-out conducted.  Noted no overlying erythema, induration, or other signs of local infection.  Skin prepped in a sterile fashion.  Topical analgesic spray: Ethyl chloride.  Joint: Left Knee with anterolateral approach Needle: 25 gauge 1 1/2 inch Completed without difficulty.  Meds: 3 cc 1% lidocaine, 1 cc depomedrol Advised to call if fevers/chills, erythema, induration, drainage, or persistent bleeding.

## 2012-09-12 ENCOUNTER — Ambulatory Visit: Payer: Medicaid Other

## 2012-09-24 ENCOUNTER — Ambulatory Visit (INDEPENDENT_AMBULATORY_CARE_PROVIDER_SITE_OTHER): Payer: Medicaid Other | Admitting: Family Medicine

## 2012-09-24 VITALS — BP 110/73 | HR 71 | Ht 66.0 in | Wt 378.0 lb

## 2012-09-24 DIAGNOSIS — M25569 Pain in unspecified knee: Secondary | ICD-10-CM

## 2012-09-24 DIAGNOSIS — M25561 Pain in right knee: Secondary | ICD-10-CM

## 2012-09-24 MED ORDER — TRAMADOL HCL 50 MG PO TABS: 50.0000 mg | ORAL_TABLET | Freq: Every evening | ORAL | Status: DC | PRN

## 2012-09-24 MED ORDER — MELOXICAM 15 MG PO TABS: 15.0000 mg | ORAL_TABLET | Freq: Every day | ORAL | Status: DC

## 2012-09-24 NOTE — Patient Instructions (Signed)
Thank you for coming in today I am glad your knees are doing better  1. Ice knees 3x per day for 15 min 2. Do home exercises every day [facility]. [facility] [employer] and [facility] or bike for 30 min 4. Consider trying topical capsaicin cream (Capsin cream) 5. You can also take tylenol extra strength 2 tablets 3x per day

## 2012-09-25 NOTE — Progress Notes (Signed)
CC: Bilateral knee pain followup HPI: Patient is a 47 year old morbidly obese female who presents for followup of bilateral knee pain. Her knee pain previously started after she did a healthier living challenging and increase her activity. We injected her with cortisone shots last time and she presents for followup. She says that the cortisone shots significantly helped her. Her knees feel about 75% better although she still has significant pain and has to ambulate with a cane. She is taking the meloxicam every day he thinks it helps. She is largely just taking it easy and is not exercising. Her worst pain is with getting up off of the sofa.  ROS: As above in the HPI. All other systems are stable or negative.  OBJECTIVE: APPEARANCE:  Patient in no acute distress.The patient appeared well nourished and normally developed. HEENT: No scleral icterus. Conjunctiva non-injected Resp: Non labored Skin: No rash MSK:  Right Knee - Inspection normal with no erythema or effusion or obvious bony abnormalities.  - Palpation abnormal with tenderness diffusely at joint line, patellar tendon, quad tendon, peripatellar area   - ROM normal in flexion and extension. - Strength 5/5 in flexion and extension. - Exam difficulty to body habitus but Ligaments with solid consistent endpoints including ACL, PCL, LCL, MCL.  - Pain with Mcmurray's.  - painful patellar compression.  - Neurovascularly intact  Left Knee - Inspection normal with no erythema or effusion or obvious bony abnormalities.  - Palpation abnormal with tenderness diffusely at joint line, patellar tendon, quad tendon, peripatellar area   - ROM normal in flexion and extension. - Strength 5/5 in flexion and extension. - Exam difficulty to body habitus but Ligaments with solid consistent endpoints including ACL, PCL, LCL, MCL.  - Pain with Mcmurray's.  - painful patellar compression.  - Neurovascularly intact  ASSESSMENT: #1. Bilateral knee  pain, improving   PLAN: I am glad to hear that the patient's pain is improving although she does continue to have significant symptoms. We will refill and continue her meloxicam at this time. She was given some stretching and strengthening exercises to work on at home. I've encouraged her to join the [employer] where she can exercise in the water or a stationary bicycle. Again reinforced the importance of weight loss in preventing worsening arthritis of her knees. Followup one month.

## 2012-10-22 ENCOUNTER — Ambulatory Visit: Payer: Medicaid Other | Admitting: Family Medicine

## 2012-11-03 ENCOUNTER — Emergency Department (HOSPITAL_COMMUNITY)
Admission: EM | Admit: 2012-11-03 | Discharge: 2012-11-03 | Disposition: A | Discharge status: Home / Self Care | Payer: Medicaid Other | Attending: Emergency Medicine | Admitting: Emergency Medicine

## 2012-11-03 ENCOUNTER — Emergency Department (HOSPITAL_COMMUNITY): Payer: Medicaid Other

## 2012-11-03 DIAGNOSIS — G4730 Sleep apnea, unspecified: Secondary | ICD-10-CM | POA: Insufficient documentation

## 2012-11-03 DIAGNOSIS — M129 Arthropathy, unspecified: Secondary | ICD-10-CM | POA: Insufficient documentation

## 2012-11-03 DIAGNOSIS — F17200 Nicotine dependence, unspecified, uncomplicated: Secondary | ICD-10-CM | POA: Insufficient documentation

## 2012-11-03 DIAGNOSIS — I10 Essential (primary) hypertension: Secondary | ICD-10-CM | POA: Insufficient documentation

## 2012-11-03 DIAGNOSIS — R0789 Other chest pain: Secondary | ICD-10-CM

## 2012-11-03 DIAGNOSIS — E119 Type 2 diabetes mellitus without complications: Secondary | ICD-10-CM | POA: Insufficient documentation

## 2012-11-03 DIAGNOSIS — R071 Chest pain on breathing: Secondary | ICD-10-CM | POA: Insufficient documentation

## 2012-11-03 DIAGNOSIS — Z9851 Tubal ligation status: Secondary | ICD-10-CM | POA: Insufficient documentation

## 2012-11-03 DIAGNOSIS — Z791 Long term (current) use of non-steroidal anti-inflammatories (NSAID): Secondary | ICD-10-CM | POA: Insufficient documentation

## 2012-11-03 DIAGNOSIS — J45909 Unspecified asthma, uncomplicated: Secondary | ICD-10-CM | POA: Insufficient documentation

## 2012-11-03 DIAGNOSIS — Z79899 Other long term (current) drug therapy: Secondary | ICD-10-CM | POA: Insufficient documentation

## 2012-11-03 LAB — POCT I-STAT, CHEM 8
BUN: 5 mg/dL — ABNORMAL LOW (ref 6–23)
Calcium, Ion: 1.21 mmol/L (ref 1.12–1.23)
Chloride: 100 mEq/L (ref 96–112)
HCT: 38 % (ref 36.0–46.0)
Potassium: 3.4 mEq/L — ABNORMAL LOW (ref 3.5–5.1)
Sodium: 141 mEq/L (ref 135–145)

## 2012-11-03 LAB — POCT I-STAT TROPONIN I: Troponin i, poc: 0 ng/mL (ref 0.00–0.08)

## 2012-11-03 MED ORDER — ASPIRIN 81 MG PO CHEW: 324.0000 mg | CHEWABLE_TABLET | Freq: Once | ORAL | Status: DC

## 2012-11-03 MED ORDER — POTASSIUM CHLORIDE CRYS ER 20 MEQ PO TBCR
40.0000 meq | EXTENDED_RELEASE_TABLET | Freq: Once | ORAL | Status: AC
  Administered 2012-11-03: 40 meq via ORAL
  Filled 2012-11-03: qty 2

## 2012-11-03 MED ORDER — ACETAMINOPHEN 325 MG PO TABS
975.0000 mg | ORAL_TABLET | Freq: Once | ORAL | Status: AC
  Administered 2012-11-03: 975 mg via ORAL
  Filled 2012-11-03: qty 3

## 2012-11-03 NOTE — ED Provider Notes (Signed)
CSN: [id number]     Arrival date & time 11/03/12  7829 History   None    Chief Complaint  Patient presents with  . Chest Pain   (Consider location/radiation/quality/duration/timing/severity/associated sxs/prior Treatment) HPI Complains of right-sided lateral chest pain onset 2 days ago constant nonradiating worse when she changes position or moves her right arm or when she lifts her grandson .no pain when she is perfectly still. No shortness of breath nausea or sweatiness. No treatment prior to coming here. Pain is sharp in quality. No other associated symptoms.  Past Medical History  Diagnosis Date  . Diabetes mellitus   . Asthma   . Hypertension   . Obesity   . Sleep apnea   . Arthritis   . H/O tubal ligation    Past Surgical History  Procedure Laterality Date  . Cholecystectomy    . Tubal ligation Bilateral    Family History  Problem Relation Age of Onset  . Diabetes Mother   . Stroke Mother   . Hypertension Mother   . Cancer Sister    History  Substance Use Topics  . Smoking status: Current Every Day Smoker -- 1.00 packs/day    Types: Cigarettes  . Smokeless tobacco: Never Used  . Alcohol Use: No   ex-smoker quit one month ago OB History   Grav Para Term Preterm Abortions TAB SAB Ect Mult Living   3 3 2 1     1 4      Review of Systems  Constitutional: Negative.   HENT: Negative.   Respiratory: Negative.   Cardiovascular: Positive for chest pain.  Gastrointestinal: Negative.   Musculoskeletal: Negative.   Skin: Positive for wound.       "Bug bite" on left breast for past few days. Healing well  Neurological: Negative.   Psychiatric/Behavioral: Negative.   All other systems reviewed and are negative.    Allergies  Review of patient's allergies indicates no known allergies.  Home Medications   Current Outpatient Rx  Name  Route  Sig  Dispense  Refill  . albuterol (PROVENTIL HFA;VENTOLIN HFA) 108 (90 BASE) MCG/ACT inhaler   Inhalation   Inhale 2  puffs into the lungs every 6 (six) hours as needed for shortness of breath.   8.5 g   3   . buPROPion (WELLBUTRIN XL) 300 MG 24 hr tablet   Oral   Take 300 mg by mouth daily.         . clonazePAM (KLONOPIN) 1 MG tablet   Oral   Take 1 mg by mouth at bedtime.         . DULoxetine (CYMBALTA) 60 MG capsule   Oral   Take 60 mg by mouth daily.         . meloxicam (MOBIC) 15 MG tablet   Oral   Take 1 tablet (15 mg total) by mouth daily.   30 tablet   0   . metFORMIN (GLUCOPHAGE XR) 500 MG 24 hr tablet   Oral   Take 2 tablets (1,000 mg total) by mouth 2 (two) times daily with a meal.   120 tablet   3   . Multiple Vitamins-Minerals (MULTIVITAMIN PO)   Oral   Take by mouth.         . nicotine (NICODERM CQ - DOSED IN MG/24 HOURS) 21 mg/24hr patch   Transdermal   Place 1 patch onto the skin daily.   28 patch   0   . glucose monitoring kit (FREESTYLE) monitoring  kit   Does not apply   1 each by Does not apply route as needed for other. Dispense any model that is covered- dispense testing supplies for Q AC/ HS accuchecks- 1 month supply with one refil.   1 each   1   . Lancets (FREESTYLE) lancets      Use as instructed   100 each   12    BP 119/75  Pulse 67  Temp(Src) 97.8 F (36.6 C) (Oral)  Resp 18  SpO2 99%  LMP 09/29/2012 Physical Exam  Nursing note and vitals reviewed. Constitutional: She appears well-developed and well-nourished.  HENT:  Head: Normocephalic and atraumatic.  Eyes: Conjunctivae are normal. Pupils are equal, round, and reactive to light.  Neck: Neck supple. No tracheal deviation present. No thyromegaly present.  Cardiovascular: Normal rate and regular rhythm.   No murmur heard. Pulmonary/Chest: Effort normal and breath sounds normal. She exhibits tenderness.  Exquisitely tender right lateral chest. Pain is reproduced with forcible abduction of right shoulder.  Abdominal: Soft. Bowel sounds are normal. She exhibits no distension.  There is no tenderness.  Morbidly obese  Musculoskeletal: Normal range of motion. She exhibits no edema and no tenderness.  Neurological: She is alert. Coordination normal.  Skin: Skin is warm and dry. No rash noted.  Dime sized healing lesion on left lateral breast  Psychiatric: She has a normal mood and affect.    ED Course  Procedures (including critical care time) Labs Review Labs Reviewed - No data to display Imaging Review No results found.  EKG Interpretation     Ventricular Rate:  88 PR Interval:  194 QRS Duration: 86 QT Interval:  395 QTC Calculation: 478 R Axis:   24 Text Interpretation:  Age not entered, assumed to be  47 years old for purpose of ECG interpretation Sinus rhythm Left ventricular hypertrophy Borderline prolonged QT interval No significant change since last tracing           CHEST XRAY VIEWED BY ME Results for orders placed during the hospital encounter of 11/03/12  POCT I-STAT, CHEM 8      Result Value Range   Sodium 141  135 - 145 mEq/L   Potassium 3.4 (*) 3.5 - 5.1 mEq/L   Chloride 100  96 - 112 mEq/L   BUN 5 (*) 6 - 23 mg/dL   Creatinine, Ser 1.61  0.50 - 1.10 mg/dL   Glucose, Bld 096 (*) 70 - 99 mg/dL   Calcium, Ion 0.45  4.09 - 1.23 mmol/L   TCO2 27  0 - 100 mmol/L   Hemoglobin 12.9  12.0 - 15.0 g/dL   HCT 81.1  91.4 - 78.2 %  POCT I-STAT TROPONIN I      Result Value Range   Troponin i, poc 0.00  0.00 - 0.08 ng/mL   Comment 3            Dg Chest 2 View  11/03/2012   CLINICAL DATA:  Chest pain  EXAM: CHEST  2 VIEW  COMPARISON:  July 02, 2008  FINDINGS: The lungs are clear. Heart size and pulmonary vascularity are normal. No adenopathy. No pneumothorax. No bone lesions.  IMPRESSION: No abnormality noted.   Electronically Signed   By: Bretta Bang M.D.   On: 11/03/2012 07:51   8:20 AM pain improved after treatment with Tylenol MDM  No diagnosis found. Heart score equals 2. Exam and history highly consistent with chest wall  pain.planTylenol for pain Followup [facility] as needed  Diagnosis #1 chest wall pain #2 hyperglycemia #3 hypokalemia    Doug Sou, MD 11/03/12 [phone]

## 2012-11-03 NOTE — ED Notes (Signed)
Patient discharged to home with family. NAD.  

## 2012-11-03 NOTE — ED Notes (Signed)
Pt states that yesterday she picked up her grandson and he is very heavy and it has hurt since. She states that the pain is in her right arm when she moves it and then she feels it in her back. Pain is tender to touch.

## 2012-11-03 NOTE — ED Notes (Signed)
Patient asking for urine to be checked. Patient provided urine sample. No orders at this time for urine.

## 2012-11-03 NOTE — ED Notes (Signed)
Pt states that she woke up this morning and went to the bathroom and moved her right arm and felt extreme pain that goes to her chest. 12 lead SR 150/100. HR 72. CBG 179.

## 2012-11-15 ENCOUNTER — Ambulatory Visit: Payer: Medicaid Other | Attending: Internal Medicine | Admitting: Internal Medicine

## 2012-11-15 ENCOUNTER — Encounter: Payer: Self-pay | Admitting: Internal Medicine

## 2012-11-15 VITALS — BP 158/89 | HR 85 | Temp 98.2°F | Resp 14 | Ht 66.0 in | Wt 370.0 lb

## 2012-11-15 DIAGNOSIS — M1990 Unspecified osteoarthritis, unspecified site: Secondary | ICD-10-CM

## 2012-11-15 DIAGNOSIS — M1712 Unilateral primary osteoarthritis, left knee: Secondary | ICD-10-CM

## 2012-11-15 DIAGNOSIS — Z0000 Encounter for general adult medical examination without abnormal findings: Secondary | ICD-10-CM

## 2012-11-15 DIAGNOSIS — E119 Type 2 diabetes mellitus without complications: Secondary | ICD-10-CM

## 2012-11-15 DIAGNOSIS — F411 Generalized anxiety disorder: Secondary | ICD-10-CM

## 2012-11-15 DIAGNOSIS — M129 Arthropathy, unspecified: Secondary | ICD-10-CM

## 2012-11-15 DIAGNOSIS — M1710 Unilateral primary osteoarthritis, unspecified knee: Secondary | ICD-10-CM

## 2012-11-15 LAB — TSH: TSH: 2.085 u[IU]/mL (ref 0.350–4.500)

## 2012-11-15 LAB — POCT GLYCOSYLATED HEMOGLOBIN (HGB A1C): Hemoglobin A1C: 7.5

## 2012-11-15 MED ORDER — BUPROPION HCL ER (XL) 300 MG PO TB24: 300.0000 mg | ORAL_TABLET | Freq: Every day | ORAL | Status: DC

## 2012-11-15 MED ORDER — METFORMIN HCL ER 500 MG PO TB24: 1000.0000 mg | ORAL_TABLET | Freq: Two times a day (BID) | ORAL | Status: DC

## 2012-11-15 MED ORDER — CLONAZEPAM 1 MG PO TABS: 1.0000 mg | ORAL_TABLET | Freq: Every day | ORAL | Status: DC

## 2012-11-15 MED ORDER — DULOXETINE HCL 60 MG PO CPEP: 60.0000 mg | ORAL_CAPSULE | Freq: Every day | ORAL | Status: DC

## 2012-11-15 MED ORDER — MELOXICAM 15 MG PO TABS: 15.0000 mg | ORAL_TABLET | Freq: Every day | ORAL | Status: DC

## 2012-11-15 MED ORDER — ACETAMINOPHEN-CODEINE #3 300-30 MG PO TABS: 1.0000 | ORAL_TABLET | ORAL | Status: DC | PRN

## 2012-11-15 NOTE — Progress Notes (Signed)
Pt is requesting lab work. Lately she said that she is not feeling normal.  she's very tired and sluggish feeling. Pt is still having extreme pain in both knees.

## 2012-11-15 NOTE — Progress Notes (Signed)
Patient ID: Tara Keller, female   DOB: February 12, 1965, 47 y.o.   MRN: [id number]  CC: Followup  HPI: 47 year old female with past medical history of diabetes, depression and anxiety who presented to clinic for followup. Patient reports having more irregular menstrual bleed and they do not last as long. She also reports having occasional hot flashes, tiredness and sleeping problems. This all started couple of weeks ago. She reports no chest pain or shortness of breath.  No Known Allergies Past Medical History  Diagnosis Date  . Diabetes mellitus   . Asthma   . Hypertension   . Obesity   . Sleep apnea   . Arthritis   . H/O tubal ligation    Current Outpatient Prescriptions on File Prior to Visit  Medication Sig Dispense Refill  . glucose monitoring kit (FREESTYLE) monitoring kit 1 each by Does not apply route as needed for other. Dispense any model that is covered- dispense testing supplies for Q AC/ HS accuchecks- 1 month supply with one refil.  1 each  1  . Lancets (FREESTYLE) lancets Use as instructed  100 each  12  . Multiple Vitamins-Minerals (MULTIVITAMIN PO) Take by mouth.      . nicotine (NICODERM CQ - DOSED IN MG/24 HOURS) 21 mg/24hr patch Place 1 patch onto the skin daily.  28 patch  0   No current facility-administered medications on file prior to visit.   Family History  Problem Relation Age of Onset  . Diabetes Mother   . Stroke Mother   . Hypertension Mother   . Cancer Sister    History   Social History  . Marital Status: Single    Spouse Name: N/A    Number of Children: N/A  . Years of Education: N/A   Occupational History  . Not on file.   Social History Main Topics  . Smoking status: Current Every Day Smoker -- 1.00 packs/day    Types: Cigarettes  . Smokeless tobacco: Never Used  . Alcohol Use: No  . Drug Use: Not on file  . Sexual Activity: Not on file   Other Topics Concern  . Not on file   Social History Narrative  . No narrative on file     Review of Systems  Constitutional: Negative for fever, chills, diaphoresis, activity change, appetite change and fatigue.  HENT: Negative for ear pain, nosebleeds, congestion, facial swelling, rhinorrhea, neck pain, neck stiffness and ear discharge.   Eyes: Negative for pain, discharge, redness, itching and visual disturbance.  Respiratory: Negative for cough, choking, chest tightness, shortness of breath, wheezing and stridor.   Cardiovascular: Negative for chest pain, palpitations and leg swelling.  Gastrointestinal: Negative for abdominal distention.  Genitourinary: Negative for dysuria, urgency, frequency, hematuria, flank pain, decreased urine volume, difficulty urinating and dyspareunia.  Musculoskeletal: Negative for back pain, joint swelling, arthralgias and gait problem.  Neurological: Negative for dizziness, tremors, seizures, syncope, facial asymmetry, speech difficulty, positive weakness, negative for light-headedness, numbness and headaches.  Hematological: Negative for adenopathy. Does not bruise/bleed easily.  Psychiatric/Behavioral: Negative for hallucinations, behavioral problems, confusion, dysphoric mood, decreased concentration and agitation.    Objective:   Filed Vitals:   11/15/12 1125  BP: 158/89  Pulse: 85  Temp: 98.2 F (36.8 C)  Resp: 14    Physical Exam  Constitutional: Appears well-developed and well-nourished. No distress.  HENT: Normocephalic. External right and left ear normal. Oropharynx is clear and moist.  Eyes: Conjunctivae and EOM are normal. PERRLA, no scleral icterus.  Neck:  Normal ROM. Neck supple. No JVD. No tracheal deviation. No thyromegaly.  CVS: RRR, S1/S2 +, no murmurs, no gallops, no carotid bruit.  Pulmonary: Effort and breath sounds normal, no stridor, rhonchi, wheezes, rales.  Abdominal: Soft. BS +,  no distension, tenderness, rebound or guarding.  Musculoskeletal: Normal range of motion. No edema and no tenderness.   Lymphadenopathy: No lymphadenopathy noted, cervical, inguinal. Neuro: Alert. Normal reflexes, muscle tone coordination. No cranial nerve deficit. Skin: Skin is warm and dry. No rash noted. Not diaphoretic. No erythema. No pallor.  Psychiatric: Normal mood and affect. Behavior, judgment, thought content normal.   Lab Results  Component Value Date   WBC 10.8* 07/31/2012   HGB 12.9 11/03/2012   HCT 38.0 11/03/2012   MCV 81.9 07/31/2012   PLT 326 07/31/2012   Lab Results  Component Value Date   CREATININE 0.90 11/03/2012   BUN 5* 11/03/2012   NA 141 11/03/2012   K 3.4* 11/03/2012   CL 100 11/03/2012   CO2 29 07/31/2012    Lab Results  Component Value Date   HGBA1C 7.8* 07/31/2012   Lipid Panel     Component Value Date/Time   CHOL 127 08/15/2012 1058   TRIG 105 08/15/2012 1058   HDL 34* 08/15/2012 1058   CHOLHDL 3.7 08/15/2012 1058   VLDL 21 08/15/2012 1058   LDLCALC 72 08/15/2012 1058       Assessment and plan:   Patient Active Problem List   Diagnosis Date Noted  . Type II or unspecified type diabetes mellitus without mention of complication, not stated as uncontrolled 05/28/2012    Priority: Medium - Check A1c today  - Continue metformin 1 g twice daily   . Osteoarthritis of left and right knee 05/28/2012    Priority: Medium - May use Tylenol No. 3 as needed for pain control   . Preventive health - check TSH - gyn referral  05/28/2012

## 2012-11-15 NOTE — Patient Instructions (Addendum)
Blood Glucose Monitoring, Adult  Monitoring your blood glucose (also know as blood sugar) helps you to manage your diabetes. It also helps you and your health care provider monitor your diabetes and determine how well your treatment plan is working.  WHY SHOULD YOU MONITOR YOUR BLOOD GLUCOSE?  · It can help you understand how food, exercise, and medicine affect your blood glucose.  · It allows you to know what your blood glucose is at any given moment. You can quickly tell if you are having low blood glucose (hypoglycemia) or high blood glucose (hyperglycemia).  · It can help you and your health care provider know how to adjust your medicines.  · It can help you understand how to manage an illness or adjust medicine for exercise.  WHEN SHOULD YOU TEST?  Your health care provider will help you decide how often you should check your blood glucose. This may depend on the type of diabetes you have, your diabetes control, or the types of medicines you are taking. Be sure to write down all of your blood glucose readings so that this information can be reviewed with your health care provider. See below for examples of testing times that your health care provider may suggest.  Type 1 Diabetes  · Test 4 times a day if you are in good control, using an insulin pump, or perform multiple daily injections.  · If your diabetes is not well-controlled or if you are sick, you may need to monitor more often.  · It is a good idea to also monitor:  · Before and after exercise.  · Between meals and 2 hours after a meal.  · Occasionally between 2:00 to 3:00 am.  Type 2 Diabetes  · It can vary with each person, but generally, if you are on insulin, test 4 times a day.  · If you take medicines by mouth (orally), test 2 times a day.  · If you are on a controlled diet, test once a day.  · If your diabetes is not well controlled or if you are sick, you may need to monitor more often.  HOW TO MONITOR YOUR BLOOD GLUCOSE  Supplies  Needed  · Blood glucose meter.  · Test strips for your meter. Each meter has its own strips. You must use the strips that go with your own meter.  · A pricking needle (lancet).  · A device that holds the lancet (lancing device).  · A journal or log book to write down your results.  Procedure  · Wash your hands with soap and water. Alcohol is not preferred.  · Prick the side of your finger (not the tip) with the lancet.  · Gently milk the finger until a small drop of blood appears.  · Follow the instructions that come with your meter for inserting the test strip, applying blood to the strip, and using your blood glucose meter.  Other Areas to Get Blood for Testing  Some meters allow you to use other areas of your body (other than your finger) to test your blood. These areas are called alternative sites. The most common alternative sites are:  · The forearm.  · The thigh.  · The back area of the lower leg.  · The palm of the hand.  The blood flow in these areas is slower. Therefore, the blood glucose values you get may be delayed, and the numbers are different from what you would get from your fingers. Do not use alternative   glucose checks. ADDITIONAL TIPS FOR GLUCOSE MONITORING  Do not reuse lancets.  Always carry your supplies with you.  All blood glucose meters have a 24-hour "hotline" number to call if you have questions or need help.  Adjust (calibrate) your blood glucose meter with a control solution after finishing a few boxes of strips. BLOOD GLUCOSE RECORD KEEPING It is a good idea to keep a daily record or log of your blood glucose readings. Most glucose meters, if not all, keep your glucose records stored in the meter. Some meters come with the ability to download  your records to your home computer. Keeping a record of your blood glucose readings is especially helpful if you are wanting to look for patterns. Make notes to go along with the blood glucose readings because you might forget what happened at that exact time. Keeping good records helps you and your health care provider to work together to achieve good diabetes management.  Document Released: 12/29/2002 Document Revised: 08/28/2012 Document Reviewed: 05/20/2012 [facility] Patient Information 2014 [place], [place]. Osteoarthritis Osteoarthritis is the most common form of arthritis. It is redness, soreness, and swelling (inflammation) affecting the cartilage. Cartilage acts as a cushion, covering the ends of bones where they meet to form a joint. CAUSES  Over time, the cartilage begins to wear away. This causes bone to rub on bone. This produces pain and stiffness in the affected joints. Factors that contribute to this problem are:  Excessive body weight.  Age.  Overuse of joints. SYMPTOMS   People with osteoarthritis usually experience joint pain, swelling, or stiffness.  Over time, the joint may lose its normal shape.  Small deposits of bone (osteophytes) may grow on the edges of the joint.  Bits of bone or cartilage can break off and float inside the joint space. This may cause more pain and damage.  Osteoarthritis can lead to depression, anxiety, feelings of helplessness, and limitations on daily activities. The most commonly affected joints are in the:  Ends of the fingers.  Thumbs.  Neck.  Lower back.  Knees.  Hips. DIAGNOSIS  Diagnosis is mostly based on your symptoms and exam. Tests may be helpful, including:  X-rays of the affected joint.  A computerized magnetic scan (MRI).  Blood tests to rule out other types of arthritis.  Joint fluid tests. This involves using a needle to draw fluid from the joint and examining the fluid under a microscope. TREATMENT  Goals of  treatment are to control pain, improve joint function, maintain a normal body weight, and maintain a healthy lifestyle. Treatment approaches may include:  A prescribed exercise program with rest and joint relief.  Weight control with nutritional education.  Pain relief techniques such as:  Properly applied heat and cold.  Electric pulses delivered to nerve endings under the skin (transcutaneous electrical nerve stimulation, TENS).  Massage.  Certain supplements. Ask your caregiver before using any supplements, especially in combination with prescribed drugs.  Medicines to control pain, such as:  Acetaminophen.  Nonsteroidal anti-inflammatory drugs (NSAIDs), such as naproxen.  Narcotic or central-acting agents, such as tramadol. This drug carries a risk of addiction and is generally prescribed for short-term use.  Corticosteroids. These can be given orally or as injection. This is a short-term treatment, not recommended for routine use.  Surgery to reposition the bones and relieve pain (osteotomy) or to remove loose pieces of bone and cartilage. Joint replacement may be needed in advanced states of osteoarthritis. HOME CARE INSTRUCTIONS  Your caregiver can recommend  specific types of exercise. These may include:  Strengthening exercises. These are done to strengthen the muscles that support joints affected by arthritis. They can be performed with weights or with exercise bands to add resistance.  Aerobic activities. These are exercises, such as brisk walking or low-impact aerobics, that get your heart pumping. They can help keep your lungs and circulatory system in shape.  Range-of-motion activities. These keep your joints limber.  Balance and agility exercises. These help you maintain daily living skills. Learning about your condition and being actively involved in your care will help improve the course of your osteoarthritis. SEEK MEDICAL CARE IF:   You feel hot or your skin  turns red.  You develop a rash in addition to your joint pain.  You have an oral temperature above 102 F (38.9 C). FOR MORE INFORMATION  National Institute of Arthritis and Musculoskeletal and Skin Diseases: www.niams.http://www.myers.net/ [employer] on Aging: https://walker.com/ American College of Rheumatology: www.rheumatology.org Document Released: 12/26/2004 Document Revised: 03/20/2011 Document Reviewed: 04/08/2009 [facility] Patient Information 2014 [place], [place].

## 2012-12-04 ENCOUNTER — Telehealth: Payer: Self-pay | Admitting: Internal Medicine

## 2012-12-04 NOTE — Telephone Encounter (Signed)
Left message with normal lab results.

## 2012-12-04 NOTE — Telephone Encounter (Signed)
Pt calling about results for labs done last week. Please f/u with pt.

## 2013-01-01 ENCOUNTER — Ambulatory Visit: Payer: Medicaid Other | Attending: Internal Medicine | Admitting: Internal Medicine

## 2013-01-01 ENCOUNTER — Encounter: Payer: Self-pay | Admitting: Internal Medicine

## 2013-01-01 VITALS — BP 139/92 | HR 76 | Temp 98.7°F | Resp 14 | Ht 66.0 in | Wt 371.6 lb

## 2013-01-01 DIAGNOSIS — R0981 Nasal congestion: Secondary | ICD-10-CM

## 2013-01-01 DIAGNOSIS — IMO0001 Reserved for inherently not codable concepts without codable children: Secondary | ICD-10-CM

## 2013-01-01 DIAGNOSIS — F17200 Nicotine dependence, unspecified, uncomplicated: Secondary | ICD-10-CM

## 2013-01-01 DIAGNOSIS — J3489 Other specified disorders of nose and nasal sinuses: Secondary | ICD-10-CM

## 2013-01-01 DIAGNOSIS — N926 Irregular menstruation, unspecified: Secondary | ICD-10-CM

## 2013-01-01 DIAGNOSIS — E119 Type 2 diabetes mellitus without complications: Secondary | ICD-10-CM

## 2013-01-01 DIAGNOSIS — N920 Excessive and frequent menstruation with regular cycle: Secondary | ICD-10-CM

## 2013-01-01 MED ORDER — GLIMEPIRIDE 2 MG PO TABS: 2.0000 mg | ORAL_TABLET | Freq: Every day | ORAL | Status: DC

## 2013-01-01 MED ORDER — NICOTINE 14 MG/24HR TD PT24: 14.0000 mg | MEDICATED_PATCH | Freq: Every day | TRANSDERMAL | Status: DC

## 2013-01-01 MED ORDER — FLUTICASONE PROPIONATE 50 MCG/ACT NA SUSP: 2.0000 | Freq: Every day | NASAL | Status: DC

## 2013-01-01 NOTE — Patient Instructions (Signed)

## 2013-01-01 NOTE — Progress Notes (Signed)
Pt is here for a physical exam. Has questions about the cigarette patch. Complains of having a period after not having one for a long period of time.

## 2013-01-01 NOTE — Progress Notes (Signed)
MRN: [id number] Name: Tara Keller  Sex: female Age: 47 y.o. DOB: [birth year]  Allergies: Review of patient's allergies indicates no known allergies.   HPI: Patient is 47 y.o. female who comes today for followup reported to have irregular menstrual periods and heavy menstruation, as per patient for the 2 months she did not had any menses and then started having heavy menstrual periods, she also has history of diabetes her last hemoglobin A1c was 7.5% as per patient her fasting sugar is 130 mg/dL, she is compliant with her medications, denies any hypoglycemic symptoms. Patient is obese and trying to lose weight. Patient also reported to have a lot of nasal congestion denies any fever chills.  Past Medical History  Diagnosis Date  . Diabetes mellitus   . Asthma   . Hypertension   . Obesity   . Sleep apnea   . Arthritis   . H/O tubal ligation     Past Surgical History  Procedure Laterality Date  . Cholecystectomy    . Tubal ligation Bilateral       Medication List       This list is accurate as of: 01/01/13  2:27 PM.  Always use your most recent med list.               acetaminophen-codeine 300-30 MG per tablet  Commonly known as:  TYLENOL #3  Take 1 tablet by mouth every 4 (four) hours as needed for moderate pain.     buPROPion 300 MG 24 hr tablet  Commonly known as:  WELLBUTRIN XL  Take 1 tablet (300 mg total) by mouth daily.     clonazePAM 1 MG tablet  Commonly known as:  KLONOPIN  Take 1 tablet (1 mg total) by mouth at bedtime.     DULoxetine 60 MG capsule  Commonly known as:  CYMBALTA  Take 1 capsule (60 mg total) by mouth daily.     fluticasone 50 MCG/ACT nasal spray  Commonly known as:  FLONASE  Place 2 sprays into both nostrils daily.     freestyle lancets  Use as instructed     glimepiride 2 MG tablet  Commonly known as:  AMARYL  Take 1 tablet (2 mg total) by mouth daily before breakfast.     glucose monitoring kit monitoring kit  1 each by Does  not apply route as needed for other. Dispense any model that is covered- dispense testing supplies for Q AC/ HS accuchecks- 1 month supply with one refil.     meloxicam 15 MG tablet  Commonly known as:  MOBIC  Take 1 tablet (15 mg total) by mouth daily.     metFORMIN 500 MG 24 hr tablet  Commonly known as:  GLUCOPHAGE XR  Take 2 tablets (1,000 mg total) by mouth 2 (two) times daily with a meal.     MULTIVITAMIN PO  Take by mouth.     nicotine 14 mg/24hr patch  Commonly known as:  NICODERM CQ - dosed in mg/24 hours  Place 1 patch (14 mg total) onto the skin daily.        Meds ordered this encounter  Medications  . glimepiride (AMARYL) 2 MG tablet    Sig: Take 1 tablet (2 mg total) by mouth daily before breakfast.    Dispense:  30 tablet    Refill:  3  . fluticasone (FLONASE) 50 MCG/ACT nasal spray    Sig: Place 2 sprays into both nostrils daily.    Dispense:  16 g  Refill:  6  . nicotine (NICODERM CQ - DOSED IN MG/24 HOURS) 14 mg/24hr patch    Sig: Place 1 patch (14 mg total) onto the skin daily.    Dispense:  28 patch    Refill:  0     There is no immunization history on file for this patient.  Family History  Problem Relation Age of Onset  . Diabetes Mother   . Stroke Mother   . Hypertension Mother   . Cancer Sister     History  Substance Use Topics  . Smoking status: Former Smoker -- 1.00 packs/day    Types: Cigarettes  . Smokeless tobacco: Never Used  . Alcohol Use: No    Review of Systems  As noted in HPI  Filed Vitals:   01/01/13 1406  BP: 139/92  Pulse: 76  Temp: 98.7 F (37.1 C)  Resp: 14    Physical Exam  Physical Exam  Constitutional:  Obese female not in acute distress   HENT:  Some nasal congestion no sinus tenderness   Eyes: EOM are normal. Pupils are equal, round, and reactive to light.  Cardiovascular: Normal rate and regular rhythm.   Pulmonary/Chest: Breath sounds normal. No respiratory distress. She has no wheezes. She  has no rales.    CBC    Component Value Date/Time   WBC 10.8* 07/31/2012 1648   RBC 4.65 07/31/2012 1648   HGB 12.9 11/03/2012 0738   HCT 38.0 11/03/2012 0738   PLT 326 07/31/2012 1648   MCV 81.9 07/31/2012 1648   LYMPHSABS 4.1* 07/31/2012 1648   MONOABS 0.8 07/31/2012 1648   EOSABS 0.4 07/31/2012 1648   BASOSABS 0.0 07/31/2012 1648    CMP     Component Value Date/Time   NA 141 11/03/2012 0738   K 3.4* 11/03/2012 0738   CL 100 11/03/2012 0738   CO2 29 07/31/2012 1648   GLUCOSE 149* 11/03/2012 0738   BUN 5* 11/03/2012 0738   CREATININE 0.90 11/03/2012 0738   CREATININE 0.80 07/31/2012 1648   CALCIUM 9.5 07/31/2012 1648    Lab Results  Component Value Date/Time   CHOL 127 08/15/2012 10:58 AM    No components found with this basename: hga1c    No results found for this basename: AST    Assessment and Plan  Irregular periods /Heavy menses - Plan: Ambulatory referral to Gynecology  Type II or unspecified type diabetes mellitus without mention of complication, not stated as uncontrolled - Plan: Continue with metformin 1 g twice a day I have added glimepiride (AMARYL) 2 MG tablet daily advised patient to continue fingerstick monitoring at home, we'll do hemoglobin A1c on the next visit  Stuffy nose - Plan: fluticasone (FLONASE) 50 MCG/ACT nasal spray  Smoking - Plan: nicotine (NICODERM CQ - DOSED IN MG/24 HOURS) 14 mg/24hr patch    Return in about 3 months (around 04/01/2013).  Doris Cheadle, MD

## 2013-01-17 ENCOUNTER — Emergency Department (HOSPITAL_COMMUNITY): Payer: Medicaid Other

## 2013-01-17 ENCOUNTER — Emergency Department (HOSPITAL_COMMUNITY)
Admission: EM | Admit: 2013-01-17 | Discharge: 2013-01-17 | Disposition: A | Discharge status: Home / Self Care | Payer: Medicaid Other | Attending: Emergency Medicine | Admitting: Emergency Medicine

## 2013-01-17 ENCOUNTER — Encounter (HOSPITAL_COMMUNITY): Payer: Self-pay | Admitting: Emergency Medicine

## 2013-01-17 DIAGNOSIS — I10 Essential (primary) hypertension: Secondary | ICD-10-CM | POA: Insufficient documentation

## 2013-01-17 DIAGNOSIS — J45901 Unspecified asthma with (acute) exacerbation: Secondary | ICD-10-CM | POA: Insufficient documentation

## 2013-01-17 DIAGNOSIS — Z87891 Personal history of nicotine dependence: Secondary | ICD-10-CM | POA: Insufficient documentation

## 2013-01-17 DIAGNOSIS — E669 Obesity, unspecified: Secondary | ICD-10-CM | POA: Insufficient documentation

## 2013-01-17 DIAGNOSIS — G4730 Sleep apnea, unspecified: Secondary | ICD-10-CM | POA: Insufficient documentation

## 2013-01-17 DIAGNOSIS — Z791 Long term (current) use of non-steroidal anti-inflammatories (NSAID): Secondary | ICD-10-CM | POA: Insufficient documentation

## 2013-01-17 DIAGNOSIS — E119 Type 2 diabetes mellitus without complications: Secondary | ICD-10-CM | POA: Insufficient documentation

## 2013-01-17 DIAGNOSIS — M129 Arthropathy, unspecified: Secondary | ICD-10-CM | POA: Insufficient documentation

## 2013-01-17 DIAGNOSIS — J069 Acute upper respiratory infection, unspecified: Secondary | ICD-10-CM

## 2013-01-17 DIAGNOSIS — IMO0002 Reserved for concepts with insufficient information to code with codable children: Secondary | ICD-10-CM | POA: Insufficient documentation

## 2013-01-17 DIAGNOSIS — R509 Fever, unspecified: Secondary | ICD-10-CM | POA: Insufficient documentation

## 2013-01-17 DIAGNOSIS — Z9851 Tubal ligation status: Secondary | ICD-10-CM | POA: Insufficient documentation

## 2013-01-17 DIAGNOSIS — Z79899 Other long term (current) drug therapy: Secondary | ICD-10-CM | POA: Insufficient documentation

## 2013-01-17 LAB — GLUCOSE, CAPILLARY: Glucose-Capillary: 182 mg/dL — ABNORMAL HIGH (ref 70–99)

## 2013-01-17 MED ORDER — ALBUTEROL SULFATE (2.5 MG/3ML) 0.083% IN NEBU
5.0000 mg | INHALATION_SOLUTION | Freq: Once | RESPIRATORY_TRACT | Status: AC
  Administered 2013-01-17: 5 mg via RESPIRATORY_TRACT
  Filled 2013-01-17: qty 6

## 2013-01-17 MED ORDER — ALBUTEROL SULFATE HFA 108 (90 BASE) MCG/ACT IN AERS: 1.0000 | INHALATION_SPRAY | Freq: Four times a day (QID) | RESPIRATORY_TRACT | Status: DC | PRN

## 2013-01-17 MED ORDER — PREDNISONE 20 MG PO TABS: 60.0000 mg | ORAL_TABLET | Freq: Every day | ORAL | Status: DC

## 2013-01-17 MED ORDER — HYDROCODONE-HOMATROPINE 5-1.5 MG/5ML PO SYRP: 5.0000 mL | ORAL_SOLUTION | Freq: Four times a day (QID) | ORAL | Status: DC | PRN

## 2013-01-17 MED ORDER — PREDNISONE 20 MG PO TABS
60.0000 mg | ORAL_TABLET | Freq: Once | ORAL | Status: AC
  Administered 2013-01-17: 60 mg via ORAL
  Filled 2013-01-17: qty 3

## 2013-01-17 MED ORDER — IPRATROPIUM BROMIDE 0.02 % IN SOLN
0.5000 mg | Freq: Once | RESPIRATORY_TRACT | Status: AC
  Administered 2013-01-17: 0.5 mg via RESPIRATORY_TRACT
  Filled 2013-01-17: qty 2.5

## 2013-01-17 NOTE — ED Notes (Signed)
Cold symptoms, sore throat, wheezing started 3 days ago, low grade temp at home. Cough productive for green/yellow sputum.

## 2013-01-17 NOTE — ED Provider Notes (Signed)
CSN: [id number]     Arrival date & time 01/17/13  0710 History   First MD Initiated Contact with Patient 01/17/13 [phone]     Chief Complaint  Patient presents with  . URI  . flu like symptoms    (Consider location/radiation/quality/duration/timing/severity/associated sxs/prior Treatment) HPI Comments: Patient with a history of Asthma presents with a chief complaint of productive cough and nasal congestion.  Symptoms have been present for the past 3 days and are gradually worsening.  She reports that she has been taking OTC cough and cold medicine without relief.  She reports an associated subjective fever, but has not checked her temperature prior to arrival.  Temperature upon arrival 56 F orally.  She reports mild shortness of breath and wheezing over the past couple of days.  Denies chest pain.  Denies headache or body aches.  She does report a "raw" feeling in her throat from repetitive coughing.  She reports that she quit smoking four months ago, but recently started smoking again.    Patient is a 48 y.o. female presenting with URI. The history is provided by the patient.  URI Presenting symptoms: congestion and cough   Associated symptoms: wheezing     Past Medical History  Diagnosis Date  . Diabetes mellitus   . Asthma   . Hypertension   . Obesity   . Sleep apnea   . Arthritis   . H/O tubal ligation    Past Surgical History  Procedure Laterality Date  . Cholecystectomy    . Tubal ligation Bilateral    Family History  Problem Relation Age of Onset  . Diabetes Mother   . Stroke Mother   . Hypertension Mother   . Cancer Sister    History  Substance Use Topics  . Smoking status: Former Smoker -- 1.00 packs/day    Types: Cigarettes  . Smokeless tobacco: Never Used  . Alcohol Use: No   OB History   Grav Para Term Preterm Abortions TAB SAB Ect Mult Living   3 3 2 1     1 4      Review of Systems  HENT: Positive for congestion.   Respiratory: Positive for cough and  wheezing.   All other systems reviewed and are negative.    Allergies  Review of patient's allergies indicates no known allergies.  Home Medications   Current Outpatient Rx  Name  Route  Sig  Dispense  Refill  . acetaminophen-codeine (TYLENOL #3) 300-30 MG per tablet   Oral   Take 1 tablet by mouth every 4 (four) hours as needed for moderate pain.   45 tablet   0   . buPROPion (WELLBUTRIN XL) 300 MG 24 hr tablet   Oral   Take 1 tablet (300 mg total) by mouth daily.   30 tablet   3   . clonazePAM (KLONOPIN) 1 MG tablet   Oral   Take 1 tablet (1 mg total) by mouth at bedtime.   30 tablet   3   . DULoxetine (CYMBALTA) 60 MG capsule   Oral   Take 1 capsule (60 mg total) by mouth daily.   30 capsule   3   . fluticasone (FLONASE) 50 MCG/ACT nasal spray   Each Nare   Place 2 sprays into both nostrils daily.   16 g   6   . glimepiride (AMARYL) 2 MG tablet   Oral   Take 1 tablet (2 mg total) by mouth daily before breakfast.   30 tablet  3   . glucose monitoring kit (FREESTYLE) monitoring kit   Does not apply   1 each by Does not apply route as needed for other. Dispense any model that is covered- dispense testing supplies for Q AC/ HS accuchecks- 1 month supply with one refil.   1 each   1   . Lancets (FREESTYLE) lancets      Use as instructed   100 each   12   . meloxicam (MOBIC) 15 MG tablet   Oral   Take 1 tablet (15 mg total) by mouth daily.   30 tablet   3   . metFORMIN (GLUCOPHAGE XR) 500 MG 24 hr tablet   Oral   Take 2 tablets (1,000 mg total) by mouth 2 (two) times daily with a meal.   120 tablet   3   . Multiple Vitamins-Minerals (MULTIVITAMIN PO)   Oral   Take by mouth.         . nicotine (NICODERM CQ - DOSED IN MG/24 HOURS) 14 mg/24hr patch   Transdermal   Place 1 patch (14 mg total) onto the skin daily.   28 patch   0    BP 110/77  Pulse 94  Temp(Src) 98.5 F (36.9 C) (Oral)  Resp 18  Wt 370 lb (167.831 kg)  SpO2 96%  LMP  12/30/2012 Physical Exam  Nursing note and vitals reviewed. Constitutional: She appears well-developed and well-nourished. No distress.  Morbidly obese  HENT:  Head: Normocephalic and atraumatic.  Right Ear: Tympanic membrane and ear canal normal.  Left Ear: Tympanic membrane and ear canal normal.  Nose: Nose normal.  Mouth/Throat: Uvula is midline, oropharynx is clear and moist and mucous membranes are normal. No oropharyngeal exudate, posterior oropharyngeal edema or posterior oropharyngeal erythema.  Neck: Normal range of motion. Neck supple.  Cardiovascular: Normal rate, regular rhythm and normal heart sounds.   Pulmonary/Chest: Effort normal. No respiratory distress. She has decreased breath sounds. She has no wheezes. She has no rales.  Musculoskeletal: Normal range of motion.  Neurological: She is alert.  Skin: Skin is warm and dry. She is not diaphoretic.  Psychiatric: She has a normal mood and affect.    ED Course  Procedures (including critical care time) Labs Review Labs Reviewed - No data to display Imaging Review Dg Chest 2 View  01/17/2013   CLINICAL DATA:  Cough and congestion. Shortness of breath. Chest pain. Diabetes. Asthma.  EXAM: CHEST  2 VIEW  COMPARISON:  DG CHEST 2 VIEW dated 11/03/2012; DG CHEST 2 VIEW dated 07/02/2008; DG CHEST 2 VIEW dated 11/13/2003  FINDINGS: Lateral view degraded by patient arm position. Midline trachea. Normal heart size for level of inspiration. No pleural effusion or pneumothorax. Persistent interstitial thickening, superimposed upon low lung volumes on the frontal. No lobar consolidation.  IMPRESSION: Low lung volumes with persistent interstitial thickening. Favored to be the sequelae of chronic bronchitis/ prior smoking. In the acute setting, a viral or atypical bacterial (mycoplasma) infectious process could look similar.   Electronically Signed   By: Abigail Miyamoto M.D.   On: 01/17/2013 08:03    EKG Interpretation   None      8:42  AM Reassessed patient.  She reports that shortness of breath has improved after getting breathing treatment.  Lungs CTAB.  Pulse ox 99 on RA.   MDM  No diagnosis found. Patient presenting with nasal congestion and cough.  No signs of respiratory distress.  Patient reports that breathing improved after getting breathing  treatment.  CXR with findings most consistent with Chronic Bronchitis.  Patient stable for discharge.  Patient discharged home with Rx for cough suppressant, Albuterol inhaler, and five day course of Prednisone.  Patient instructed to follow up with PCP.  Return precautions given.    Hyman Bible, PA-C 01/17/13 1013

## 2013-01-17 NOTE — Discharge Instructions (Signed)
Read the instructions below on reasons to return to the emergency department and to learn more about your diagnosis.  Use over the counter medications for symptomatic relief as we discussed (musinex as a decongestant, Tylenol for fever/pain, Motrin/Ibuprofen for muscle aches). If prescribed a cough suppressant during your visit, do not operate heavy machinery with in 5 hours of taking this medication. Followup with your primary care doctor in 4 days if your symptoms persist.  Your more than welcome to return to the emergency department if symptoms worsen or become concerning.  Upper Respiratory Infection, Adult  An upper respiratory infection (URI) is also sometimes known as the common cold. Most people improve within 1 week, but symptoms can last up to 2 weeks. A residual cough may last even longer.   URI is most commonly caused by a virus. Viruses are NOT treated with antibiotics. You can easily spread the virus to others by oral contact. This includes kissing, sharing a glass, coughing, or sneezing. Touching your mouth or nose and then touching a surface, which is then touched by another person, can also spread the virus.   TREATMENT  Treatment is directed at relieving symptoms. There is no cure. Antibiotics are not effective, because the infection is caused by a virus, not by bacteria. Treatment may include:  Increased fluid intake. Sports drinks offer valuable electrolytes, sugars, and fluids.  Breathing heated mist or steam (vaporizer or shower).  Eating chicken soup or other clear broths, and maintaining good nutrition.  Getting plenty of rest.  Using gargles or lozenges for comfort.  Controlling fevers with ibuprofen or acetaminophen as directed by your caregiver.  Increasing usage of your inhaler if you have asthma.  Return to work when your temperature has returned to normal.   SEEK MEDICAL CARE IF:  After the first few days, you feel you are getting worse rather than better.  You  develop worsening shortness of breath, or brown or red sputum. These may be signs of pneumonia.  You develop yellow or brown nasal discharge or pain in the face, especially when you bend forward. These may be signs of sinusitis.  You develop a fever, swollen neck glands, pain with swallowing, or white areas in the back of your throat. These may be signs of strep throat.

## 2013-01-17 NOTE — ED Notes (Signed)
CBG- [phone] 

## 2013-01-21 ENCOUNTER — Ambulatory Visit: Payer: Medicaid Other

## 2013-01-21 NOTE — ED Provider Notes (Signed)
Medical screening examination/treatment/procedure(s) were performed by non-physician practitioner and as supervising physician I was immediately available for consultation/collaboration.  Orpah Greek, MD 01/21/13 [phone]

## 2013-02-18 ENCOUNTER — Encounter: Payer: Self-pay | Admitting: Internal Medicine

## 2013-02-18 ENCOUNTER — Ambulatory Visit: Payer: Medicaid Other | Attending: Internal Medicine | Admitting: Internal Medicine

## 2013-02-18 VITALS — BP 111/78 | HR 94 | Temp 98.3°F | Resp 17

## 2013-02-18 DIAGNOSIS — K219 Gastro-esophageal reflux disease without esophagitis: Secondary | ICD-10-CM | POA: Insufficient documentation

## 2013-02-18 DIAGNOSIS — M62838 Other muscle spasm: Secondary | ICD-10-CM | POA: Insufficient documentation

## 2013-02-18 DIAGNOSIS — M549 Dorsalgia, unspecified: Secondary | ICD-10-CM

## 2013-02-18 DIAGNOSIS — IMO0002 Reserved for concepts with insufficient information to code with codable children: Secondary | ICD-10-CM | POA: Insufficient documentation

## 2013-02-18 DIAGNOSIS — F17200 Nicotine dependence, unspecified, uncomplicated: Secondary | ICD-10-CM

## 2013-02-18 DIAGNOSIS — M62830 Muscle spasm of back: Secondary | ICD-10-CM

## 2013-02-18 DIAGNOSIS — M5380 Other specified dorsopathies, site unspecified: Secondary | ICD-10-CM

## 2013-02-18 DIAGNOSIS — I10 Essential (primary) hypertension: Secondary | ICD-10-CM | POA: Insufficient documentation

## 2013-02-18 DIAGNOSIS — E119 Type 2 diabetes mellitus without complications: Secondary | ICD-10-CM | POA: Insufficient documentation

## 2013-02-18 DIAGNOSIS — J45909 Unspecified asthma, uncomplicated: Secondary | ICD-10-CM | POA: Insufficient documentation

## 2013-02-18 HISTORY — DX: Nicotine dependence, unspecified, uncomplicated: F17.200

## 2013-02-18 MED ORDER — CYCLOBENZAPRINE HCL 10 MG PO TABS: 10.0000 mg | ORAL_TABLET | Freq: Every day | ORAL | Status: DC

## 2013-02-18 MED ORDER — PANTOPRAZOLE SODIUM 40 MG PO TBEC: 40.0000 mg | DELAYED_RELEASE_TABLET | Freq: Every day | ORAL | Status: DC

## 2013-02-18 MED ORDER — NICOTINE 14 MG/24HR TD PT24: 14.0000 mg | MEDICATED_PATCH | Freq: Every day | TRANSDERMAL | Status: DC

## 2013-02-18 NOTE — Progress Notes (Signed)
Patient states here for back ache Complains of feeling nausea at times and unable to eat Having irregular periods Sore in right nostril

## 2013-02-18 NOTE — Progress Notes (Signed)
MRN: [id number] Name: Tara Keller  Sex: female Age: 48 y.o. DOB: 12/13/1965  Allergies: Review of patient's allergies indicates no known allergies.  Chief Complaint  Patient presents with  . Back Pain    HPI: Patient is 48 y.o. female who comes today reported to have lower back pain for the last several days, she denies any fall or trauma recently denies any urinary or stool incontinence denies any shooting pain down to the legs, she has been taking Mobic, denies any fever chills numbness weakness, she also reported to have a lot of GERD symptoms has not tried any medication, she has again started smoking, I have advised her to quit smoking she is going to try patch again.  Past Medical History  Diagnosis Date  . Diabetes mellitus   . Asthma   . Hypertension   . Obesity   . Sleep apnea   . Arthritis   . H/O tubal ligation     Past Surgical History  Procedure Laterality Date  . Cholecystectomy    . Tubal ligation Bilateral       Medication List       This list is accurate as of: 02/18/13 11:20 AM.  Always use your most recent med list.               albuterol 108 (90 BASE) MCG/ACT inhaler  Commonly known as:  PROVENTIL HFA;VENTOLIN HFA  Inhale 1-2 puffs into the lungs every 6 (six) hours as needed for wheezing or shortness of breath.     clonazePAM 1 MG tablet  Commonly known as:  KLONOPIN  Take 1 tablet (1 mg total) by mouth at bedtime.     cyclobenzaprine 10 MG tablet  Commonly known as:  FLEXERIL  Take 1 tablet (10 mg total) by mouth at bedtime.     DULoxetine 60 MG capsule  Commonly known as:  CYMBALTA  Take 1 capsule (60 mg total) by mouth daily.     fluticasone 50 MCG/ACT nasal spray  Commonly known as:  FLONASE  Place 2 sprays into both nostrils daily.     freestyle lancets  Use as instructed     glimepiride 2 MG tablet  Commonly known as:  AMARYL  Take 1 tablet (2 mg total) by mouth daily before breakfast.     glucose monitoring kit  monitoring kit  1 each by Does not apply route as needed for other. Dispense any model that is covered- dispense testing supplies for Q AC/ HS accuchecks- 1 month supply with one refil.     HYDROcodone-homatropine 5-1.5 MG/5ML syrup  Commonly known as:  HYCODAN  Take 5 mLs by mouth every 6 (six) hours as needed for cough.     meloxicam 15 MG tablet  Commonly known as:  MOBIC  Take 1 tablet (15 mg total) by mouth daily.     metFORMIN 500 MG 24 hr tablet  Commonly known as:  GLUCOPHAGE XR  Take 2 tablets (1,000 mg total) by mouth 2 (two) times daily with a meal.     MULTIVITAMIN PO  Take by mouth.     nicotine 14 mg/24hr patch  Commonly known as:  NICODERM CQ - dosed in mg/24 hours  Place 1 patch (14 mg total) onto the skin daily.     pantoprazole 40 MG tablet  Commonly known as:  PROTONIX  Take 1 tablet (40 mg total) by mouth daily.     predniSONE 20 MG tablet  Commonly known as:  DELTASONE  Take 3 tablets (60 mg total) by mouth daily.        Meds ordered this encounter  Medications  . cyclobenzaprine (FLEXERIL) 10 MG tablet    Sig: Take 1 tablet (10 mg total) by mouth at bedtime.    Dispense:  30 tablet    Refill:  1  . pantoprazole (PROTONIX) 40 MG tablet    Sig: Take 1 tablet (40 mg total) by mouth daily.    Dispense:  30 tablet    Refill:  3  . nicotine (NICODERM CQ - DOSED IN MG/24 HOURS) 14 mg/24hr patch    Sig: Place 1 patch (14 mg total) onto the skin daily.    Dispense:  28 patch    Refill:  0     There is no immunization history on file for this patient.  Family History  Problem Relation Age of Onset  . Diabetes Mother   . Stroke Mother   . Hypertension Mother   . Cancer Sister     History  Substance Use Topics  . Smoking status: Former Smoker -- 1.00 packs/day    Types: Cigarettes  . Smokeless tobacco: Never Used  . Alcohol Use: No    Review of Systems   As noted in HPI  Filed Vitals:   02/18/13 1104  BP: 111/78  Pulse: 94  Temp:  98.3 F (36.8 C)  Resp: 17    Physical Exam  Physical Exam  Constitutional:  Obese female sitting comfortably not in acute distress  Cardiovascular: Normal rate and regular rhythm.   Pulmonary/Chest: Breath sounds normal. No respiratory distress. She has no wheezes. She has no rales.  Abdominal:  Minimal epigastric tenderness no rebound or guarding  Musculoskeletal:  Lower lumbar spinal and paraspinal tenderness with SLR test she is complaining of back discomfort    CBC    Component Value Date/Time   WBC 10.8* 07/31/2012 1648   RBC 4.65 07/31/2012 1648   HGB 12.9 11/03/2012 0738   HCT 38.0 11/03/2012 0738   PLT 326 07/31/2012 1648   MCV 81.9 07/31/2012 1648   LYMPHSABS 4.1* 07/31/2012 1648   MONOABS 0.8 07/31/2012 1648   EOSABS 0.4 07/31/2012 1648   BASOSABS 0.0 07/31/2012 1648    CMP     Component Value Date/Time   NA 141 11/03/2012 0738   K 3.4* 11/03/2012 0738   CL 100 11/03/2012 0738   CO2 29 07/31/2012 1648   GLUCOSE 149* 11/03/2012 0738   BUN 5* 11/03/2012 0738   CREATININE 0.90 11/03/2012 0738   CREATININE 0.80 07/31/2012 1648   CALCIUM 9.5 07/31/2012 1648    Lab Results  Component Value Date/Time   CHOL 127 08/15/2012 10:58 AM    No components found with this basename: hga1c    No results found for this basename: AST    Assessment and Plan  GERD (gastroesophageal reflux disease) - Plan: I have advised patient for lifestyle modification, also quit smoking, will try pantoprazole (PROTONIX) 40 MG tablet  Smoking - Plan: nicotine (NICODERM CQ - DOSED IN MG/24 HOURS) 14 mg/24hr patch  Back pain .... continue with Mobic  Back muscle spasm - Plan: I have advised patient to apply heating pad, will try cyclobenzaprine (FLEXERIL) 10 MG tablet each bedtime   Return in about 3 months (around 05/18/2013), or if symptoms worsen or fail to improve.  Lorayne Marek, MD

## 2013-03-06 ENCOUNTER — Encounter: Payer: Medicaid Other | Admitting: Obstetrics & Gynecology

## 2013-05-13 ENCOUNTER — Telehealth: Payer: Self-pay

## 2013-05-13 NOTE — Telephone Encounter (Signed)
Tara Keller from [employer] had left message to verify  Patients appointment Returned call-Barbara not available Left message on voice mail to return our call

## 2013-05-14 ENCOUNTER — Emergency Department (HOSPITAL_COMMUNITY)
Admission: EM | Admit: 2013-05-14 | Discharge: 2013-05-15 | Disposition: A | Discharge status: Home / Self Care | Payer: Medicaid Other | Attending: Emergency Medicine | Admitting: Emergency Medicine

## 2013-05-14 ENCOUNTER — Encounter (HOSPITAL_COMMUNITY): Payer: Self-pay | Admitting: Emergency Medicine

## 2013-05-14 DIAGNOSIS — K7689 Other specified diseases of liver: Secondary | ICD-10-CM | POA: Insufficient documentation

## 2013-05-14 DIAGNOSIS — R109 Unspecified abdominal pain: Secondary | ICD-10-CM

## 2013-05-14 DIAGNOSIS — Z87891 Personal history of nicotine dependence: Secondary | ICD-10-CM | POA: Insufficient documentation

## 2013-05-14 DIAGNOSIS — Z791 Long term (current) use of non-steroidal anti-inflammatories (NSAID): Secondary | ICD-10-CM | POA: Insufficient documentation

## 2013-05-14 DIAGNOSIS — K859 Acute pancreatitis without necrosis or infection, unspecified: Secondary | ICD-10-CM | POA: Insufficient documentation

## 2013-05-14 DIAGNOSIS — Z87442 Personal history of urinary calculi: Secondary | ICD-10-CM | POA: Insufficient documentation

## 2013-05-14 DIAGNOSIS — Z9851 Tubal ligation status: Secondary | ICD-10-CM | POA: Insufficient documentation

## 2013-05-14 DIAGNOSIS — I10 Essential (primary) hypertension: Secondary | ICD-10-CM | POA: Insufficient documentation

## 2013-05-14 DIAGNOSIS — Z79899 Other long term (current) drug therapy: Secondary | ICD-10-CM | POA: Insufficient documentation

## 2013-05-14 DIAGNOSIS — K760 Fatty (change of) liver, not elsewhere classified: Secondary | ICD-10-CM

## 2013-05-14 DIAGNOSIS — M129 Arthropathy, unspecified: Secondary | ICD-10-CM | POA: Insufficient documentation

## 2013-05-14 DIAGNOSIS — E119 Type 2 diabetes mellitus without complications: Secondary | ICD-10-CM | POA: Insufficient documentation

## 2013-05-14 DIAGNOSIS — J45909 Unspecified asthma, uncomplicated: Secondary | ICD-10-CM | POA: Insufficient documentation

## 2013-05-14 DIAGNOSIS — Z3202 Encounter for pregnancy test, result negative: Secondary | ICD-10-CM | POA: Insufficient documentation

## 2013-05-14 DIAGNOSIS — IMO0002 Reserved for concepts with insufficient information to code with codable children: Secondary | ICD-10-CM | POA: Insufficient documentation

## 2013-05-14 DIAGNOSIS — R110 Nausea: Secondary | ICD-10-CM

## 2013-05-14 DIAGNOSIS — E669 Obesity, unspecified: Secondary | ICD-10-CM | POA: Insufficient documentation

## 2013-05-14 LAB — CBC WITH DIFFERENTIAL/PLATELET
Basophils Absolute: 0 10*3/uL (ref 0.0–0.1)
Basophils Relative: 0 % (ref 0–1)
EOS ABS: 0.3 10*3/uL (ref 0.0–0.7)
EOS PCT: 3 % (ref 0–5)
HCT: 38.9 % (ref 36.0–46.0)
Hemoglobin: 12.9 g/dL (ref 12.0–15.0)
LYMPHS ABS: 3.8 10*3/uL (ref 0.7–4.0)
Lymphocytes Relative: 36 % (ref 12–46)
MCH: 27.8 pg (ref 26.0–34.0)
MCHC: 33.2 g/dL (ref 30.0–36.0)
MCV: 83.8 fL (ref 78.0–100.0)
Monocytes Absolute: 0.6 10*3/uL (ref 0.1–1.0)
Monocytes Relative: 6 % (ref 3–12)
NEUTROS PCT: 55 % (ref 43–77)
Neutro Abs: 5.8 10*3/uL (ref 1.7–7.7)
Platelets: 292 10*3/uL (ref 150–400)
RBC: 4.64 MIL/uL (ref 3.87–5.11)
RDW: 13.9 % (ref 11.5–15.5)
WBC: 10.6 10*3/uL — ABNORMAL HIGH (ref 4.0–10.5)

## 2013-05-14 LAB — COMPREHENSIVE METABOLIC PANEL
ALK PHOS: 207 U/L — AB (ref 39–117)
ALT: 62 U/L — ABNORMAL HIGH (ref 0–35)
AST: 39 U/L — ABNORMAL HIGH (ref 0–37)
Albumin: 3.1 g/dL — ABNORMAL LOW (ref 3.5–5.2)
BUN: 8 mg/dL (ref 6–23)
CO2: 30 mEq/L (ref 19–32)
Calcium: 9.5 mg/dL (ref 8.4–10.5)
Chloride: 97 mEq/L (ref 96–112)
Creatinine, Ser: 0.6 mg/dL (ref 0.50–1.10)
GLUCOSE: 254 mg/dL — AB (ref 70–99)
Potassium: 3.5 mEq/L — ABNORMAL LOW (ref 3.7–5.3)
Sodium: 136 mEq/L — ABNORMAL LOW (ref 137–147)
TOTAL PROTEIN: 8.2 g/dL (ref 6.0–8.3)
Total Bilirubin: <0.2 mg/dL — ABNORMAL LOW (ref 0.3–1.2)

## 2013-05-14 LAB — LIPASE, BLOOD: LIPASE: 205 U/L — AB (ref 11–59)

## 2013-05-14 LAB — URINALYSIS, ROUTINE W REFLEX MICROSCOPIC
BILIRUBIN URINE: NEGATIVE
Glucose, UA: NEGATIVE mg/dL
Ketones, ur: NEGATIVE mg/dL
Leukocytes, UA: NEGATIVE
Nitrite: NEGATIVE
PROTEIN: NEGATIVE mg/dL
Specific Gravity, Urine: 1.013 (ref 1.005–1.030)
Urobilinogen, UA: 1 mg/dL (ref 0.0–1.0)
pH: 7 (ref 5.0–8.0)

## 2013-05-14 LAB — URINE MICROSCOPIC-ADD ON

## 2013-05-14 LAB — CBG MONITORING, ED: Glucose-Capillary: 254 mg/dL — ABNORMAL HIGH (ref 70–99)

## 2013-05-14 LAB — PREGNANCY, URINE: Preg Test, Ur: NEGATIVE

## 2013-05-14 MED ORDER — ONDANSETRON HCL 4 MG/2ML IJ SOLN
4.0000 mg | Freq: Once | INTRAMUSCULAR | Status: DC
  Filled 2013-05-14: qty 2

## 2013-05-14 MED ORDER — SODIUM CHLORIDE 0.9 % IV BOLUS (SEPSIS)
1000.0000 mL | Freq: Once | INTRAVENOUS | Status: AC
  Administered 2013-05-15: 1000 mL via INTRAVENOUS

## 2013-05-14 MED ORDER — MORPHINE SULFATE 4 MG/ML IJ SOLN
4.0000 mg | Freq: Once | INTRAMUSCULAR | Status: AC
  Administered 2013-05-15: 4 mg via INTRAVENOUS
  Filled 2013-05-14: qty 1

## 2013-05-14 NOTE — ED Notes (Signed)
The pt is c/o generalized abd pain for 3 days. Nauseated only.  lmp last month.  The pt also has pain around to the flank

## 2013-05-14 NOTE — ED Notes (Signed)
Chris Chrisco RN notified of CBG results  254

## 2013-05-14 NOTE — ED Notes (Signed)
Last bm 2 days ago

## 2013-05-15 ENCOUNTER — Emergency Department (HOSPITAL_COMMUNITY): Payer: Medicaid Other

## 2013-05-15 MED ORDER — ONDANSETRON 8 MG PO TBDP: 8.0000 mg | ORAL_TABLET | Freq: Three times a day (TID) | ORAL | Status: DC | PRN

## 2013-05-15 MED ORDER — IOHEXOL 300 MG/ML  SOLN
100.0000 mL | Freq: Once | INTRAMUSCULAR | Status: AC | PRN
  Administered 2013-05-15: 100 mL via INTRAVENOUS

## 2013-05-15 MED ORDER — IOHEXOL 300 MG/ML  SOLN
25.0000 mL | Freq: Once | INTRAMUSCULAR | Status: AC | PRN
  Administered 2013-05-15: 25 mL via ORAL

## 2013-05-15 MED ORDER — OXYCODONE HCL 5 MG PO TABS: 5.0000 mg | ORAL_TABLET | ORAL | Status: DC | PRN

## 2013-05-15 MED ORDER — ONDANSETRON HCL 4 MG/2ML IJ SOLN
4.0000 mg | Freq: Once | INTRAMUSCULAR | Status: AC
  Administered 2013-05-15: 4 mg via INTRAVENOUS

## 2013-05-15 MED ORDER — DICYCLOMINE HCL 20 MG PO TABS: 20.0000 mg | ORAL_TABLET | Freq: Four times a day (QID) | ORAL | Status: DC | PRN

## 2013-05-15 NOTE — ED Provider Notes (Signed)
CSN: [id number]     Arrival date & time 05/14/13  2125 History   First MD Initiated Contact with Patient 05/14/13 2339     Chief Complaint  Patient presents with  . Abdominal Pain     (Consider location/radiation/quality/duration/timing/severity/associated sxs/prior Treatment) HPI 48 year old female presents to emergency department from home with complaint of upper abdominal pain that wraps around into her back.  Pain is been ongoing for 2 days.  She reports nausea but no vomiting.  No fevers or chills.  No prior history of similar pain.  Patient reports past history of C-sections, no other surgeries.  Last bowel movement 2 days ago.  Patient has history of kidney stones, but pain is not similar to her kidney stones.  Patient still has her menstrual cycle, and feels that she may be due for her next menses.  She reports that she is adherent to her diet.  She has been taking her medications as prescribed.  She [facility].  Past Medical History  Diagnosis Date  . Diabetes mellitus   . Asthma   . Hypertension   . Obesity   . Sleep apnea   . Arthritis   . H/O tubal ligation    Past Surgical History  Procedure Laterality Date  . Cholecystectomy    . Tubal ligation Bilateral    Family History  Problem Relation Age of Onset  . Diabetes Mother   . Stroke Mother   . Hypertension Mother   . Cancer Sister    History  Substance Use Topics  . Smoking status: Former Smoker -- 1.00 packs/day    Types: Cigarettes  . Smokeless tobacco: Never Used  . Alcohol Use: No   OB History   Grav Para Term Preterm Abortions TAB SAB Ect Mult Living   3 3 2 1     1 4      Review of Systems  See History of Present Illness; otherwise all other systems are reviewed and negative    Allergies  Review of patient's allergies indicates no known allergies.  Home Medications   Prior to Admission medications   Medication Sig Start Date End Date Taking? Authorizing  Provider  albuterol (PROVENTIL HFA;VENTOLIN HFA) 108 (90 BASE) MCG/ACT inhaler Inhale 1-2 puffs into the lungs every 6 (six) hours as needed for wheezing or shortness of breath. 01/17/13  Yes Heather Laisure, PA-C  cyclobenzaprine (FLEXERIL) 10 MG tablet Take 1 tablet (10 mg total) by mouth at bedtime. 02/18/13  Yes Lorayne Marek, MD  DULoxetine (CYMBALTA) 60 MG capsule Take 1 capsule (60 mg total) by mouth daily. 11/15/12  Yes Robbie Lis, MD  fluticasone [facility]) 50 MCG/ACT nasal spray Place 2 sprays into both nostrils daily. 01/01/13  Yes Lorayne Marek, MD  glimepiride (AMARYL) 2 MG tablet Take 1 tablet (2 mg total) by mouth daily before breakfast. 01/01/13  Yes Deepak Advani, MD  glucose monitoring kit (FREESTYLE) monitoring kit 1 each by Does not apply route as needed for other. Dispense any model that is covered- dispense testing supplies for Q AC/ HS accuchecks- 1 month supply with one refil. 07/31/12  Yes Debbe Odea, MD  Lancets (FREESTYLE) lancets Use as instructed 05/28/12  Yes Ripudeep Krystal Eaton, MD  meloxicam (MOBIC) 15 MG tablet Take 1 tablet (15 mg total) by mouth daily. 11/15/12  Yes Robbie Lis, MD  metFORMIN (GLUCOPHAGE XR) 500 MG 24 hr tablet Take 2 tablets (1,000 mg total) by mouth 2 (two) times daily  with a meal. 11/15/12  Yes Robbie Lis, MD  Multiple Vitamins-Minerals (MULTIVITAMIN PO) Take by mouth.   Yes Historical Provider, MD  nicotine (NICODERM CQ - DOSED IN MG/24 HOURS) 14 mg/24hr patch Place 1 patch (14 mg total) onto the skin daily. 02/18/13  Yes Deepak Advani, MD  pantoprazole (PROTONIX) 40 MG tablet Take 1 tablet (40 mg total) by mouth daily. 02/18/13  Yes Deepak Advani, MD   BP 142/68  Pulse 84  Temp(Src) 98 F (36.7 C) (Oral)  Resp 20  Ht 5' 6"  (1.676 m)  Wt 354 lb (160.573 kg)  BMI 57.16 kg/m2  SpO2 98%  LMP 04/14/2013 Physical Exam  Nursing note and vitals reviewed. Constitutional: She is oriented to person, place, and time. She appears well-developed and  well-nourished. She appears distressed (uncomfortable appearing).  HENT:  Head: Normocephalic and atraumatic.  Nose: Nose normal.  Mouth/Throat: Oropharynx is clear and moist.  Eyes: Conjunctivae and EOM are normal. Pupils are equal, round, and reactive to light.  Neck: Normal range of motion. Neck supple. No JVD present. No tracheal deviation present. No thyromegaly present.  Cardiovascular: Normal rate, regular rhythm, normal heart sounds and intact distal pulses.  Exam reveals no gallop and no friction rub.   No murmur heard. Pulmonary/Chest: Effort normal and breath sounds normal. No stridor. No respiratory distress. She has no wheezes. She has no rales. She exhibits no tenderness.  Abdominal: Soft. Bowel sounds are normal. She exhibits no distension and no mass. There is tenderness (significant tenderness  in epigastrium, right upper and left upper quadrants ). There is no rebound and no guarding.  Musculoskeletal: Normal range of motion. She exhibits no edema and no tenderness.  Lymphadenopathy:    She has no cervical adenopathy.  Neurological: She is alert and oriented to person, place, and time. She exhibits normal muscle tone. Coordination normal.  Skin: Skin is warm and dry. No rash noted. No erythema. No pallor.  Psychiatric: She has a normal mood and affect. Her behavior is normal. Judgment and thought content normal.    ED Course  Procedures (including critical care time) Labs Review Labs Reviewed  CBC WITH DIFFERENTIAL - Abnormal; Notable for the following:    WBC 10.6 (*)    All other components within normal limits  COMPREHENSIVE METABOLIC PANEL - Abnormal; Notable for the following:    Sodium 136 (*)    Potassium 3.5 (*)    Glucose, Bld 254 (*)    Albumin 3.1 (*)    AST 39 (*)    ALT 62 (*)    Alkaline Phosphatase 207 (*)    Total Bilirubin <0.2 (*)    All other components within normal limits  LIPASE, BLOOD - Abnormal; Notable for the following:    Lipase 205  (*)    All other components within normal limits  URINALYSIS, ROUTINE W REFLEX MICROSCOPIC - Abnormal; Notable for the following:    Hgb urine dipstick MODERATE (*)    All other components within normal limits  CBG MONITORING, ED - Abnormal; Notable for the following:    Glucose-Capillary 254 (*)    All other components within normal limits  PREGNANCY, URINE  URINE MICROSCOPIC-ADD ON    Imaging Review Ct Abdomen Pelvis W Contrast  05/15/2013   CLINICAL DATA:  Abdominal pain. Pancreatitis and elevated liver function tests.  EXAM: CT ABDOMEN AND PELVIS WITH CONTRAST  TECHNIQUE: Multidetector CT imaging of the abdomen and pelvis was performed using the standard protocol following bolus administration  of intravenous contrast.  CONTRAST:  113m OMNIPAQUE IOHEXOL 300 MG/ML  SOLN  COMPARISON:  06/25/2006  FINDINGS: BODY WALL: Unremarkable.  LOWER CHEST: Unremarkable.  ABDOMEN/PELVIS:  Liver: Severe fatty infiltration. A 9 mm hypervascular focus in the upper right liver is not seen on delayed imaging, and was not seen on non contrast imaging from 2008.  Biliary: Cholecystectomy.  Pancreas: Unremarkable.  Spleen: Unremarkable.  Adrenals: Unremarkable.  Kidneys and ureters: 1 cm cyst in the lower pole right kidney.  Bladder: Unremarkable.  Reproductive: Sub serosal fibroid from the uterine fundus, 3 cm in diameter. Intramural left body fibroid, 2.4 cm.  Bowel: No obstruction. Normal appendix.  Retroperitoneum: No mass or adenopathy.  Peritoneum: No free fluid or gas.  Vascular: No acute abnormality.  OSSEOUS: No acute abnormalities.  IMPRESSION: 1. No acute intra-abdominal findings. 2. Severe hepatic steatosis. 3. 9 mm hypervascular lesion in the upper right liver is likely a flash fill hemangioma, although strictly indeterminate on single phase examination. If there is chronic liver disease, follow-up liver MRI in 6 months is recommended. 4. Fibroid uterus.   Electronically Signed   By: JJorje GuildM.D.    On: 05/15/2013 02:06     EKG Interpretation None      MDM   Final diagnoses:  Abdominal pain  Pancreatitis  Fatty liver  Nausea    48year old female with 2 days of epigastric pain.  On labs, she has elevation in her AST and ALT along with lipase.  Plan for CT abdomen pelvis for further evaluation of pancreatitis, possible gallstone pancreatitis, although she is not more significantly tender over her right upper quadrant and epigastrium. 3:02 AM Pt without gallbladder, reports she had gallstones removed in the past, but didn't know she had gb removed.  Fatty liver, no acute pancreas inflammation.  Tolerating POs here well.   OKalman Drape MD 05/15/13 [phone]

## 2013-05-15 NOTE — ED Notes (Signed)
Pt A&OX4, ambulatory at discharge with slow, steady gait, verbalizing no complaints at this time.  

## 2013-05-15 NOTE — Discharge Instructions (Signed)
Stick to a bland diet.  Return to the ER for worsening pain, fever, vomiting, or other concerning symptoms.  Follow up on Monday as scheduled.   Abdominal Pain, Adult Many things can cause abdominal pain. Usually, abdominal pain is not caused by a disease and will improve without treatment. It can often be observed and treated at home. Your health care provider will do a physical exam and possibly order blood tests and X-rays to help determine the seriousness of your pain. However, in many cases, more time must pass before a clear cause of the pain can be found. Before that point, your health care provider may not know if you need more testing or further treatment. HOME CARE INSTRUCTIONS  Monitor your abdominal pain for any changes. The following actions may help to alleviate any discomfort you are experiencing:  Only take over-the-counter or prescription medicines as directed by your health care provider.  Do not take laxatives unless directed to do so by your health care provider.  Try a clear liquid diet (broth, tea, or water) as directed by your health care provider. Slowly move to a bland diet as tolerated. SEEK MEDICAL CARE IF:  You have unexplained abdominal pain.  You have abdominal pain associated with nausea or diarrhea.  You have pain when you urinate or have a bowel movement.  You experience abdominal pain that wakes you in the night.  You have abdominal pain that is worsened or improved by eating food.  You have abdominal pain that is worsened with eating fatty foods. SEEK IMMEDIATE MEDICAL CARE IF:   Your pain does not go away within 2 hours.  You have a fever.  You keep throwing up (vomiting).  Your pain is felt only in portions of the abdomen, such as the right side or the left lower portion of the abdomen.  You pass bloody or black tarry stools. MAKE SURE YOU:  Understand these instructions.   Will watch your condition.   Will get help right away if you  are not doing well or get worse.  Document Released: 10/05/2004 Document Revised: 10/16/2012 Document Reviewed: 09/04/2012 [facility] Patient Information 2014 [place].  Acute Pancreatitis Acute pancreatitis is a disease in which the pancreas becomes suddenly inflamed. The pancreas is a large gland located behind your stomach. The pancreas produces enzymes that help digest food. The pancreas also releases the hormones glucagon and insulin that help regulate blood sugar. Damage to the pancreas occurs when the digestive enzymes from the pancreas are activated and begin attacking the pancreas before being released into the intestine. Most acute attacks last a couple of days and can cause serious complications. Some people become dehydrated and develop low blood pressure. In severe cases, bleeding into the pancreas can lead to shock and can be life-threatening. The lungs, heart, and kidneys may fail. CAUSES  Pancreatitis can happen to anyone. In some cases, the cause is unknown. Most cases are caused by:  Alcohol abuse.  Gallstones. Other less common causes are:  Certain medicines.  Exposure to certain chemicals.  Infection.  Damage caused by an accident (trauma).  Abdominal surgery. SYMPTOMS   Pain in the upper abdomen that may radiate to the back.  Tenderness and swelling of the abdomen.  Nausea and vomiting. DIAGNOSIS  Your caregiver will perform a physical exam. Blood and stool tests may be done to confirm the diagnosis. Imaging tests may also be done, such as X-rays, CT scans, or an ultrasound of the abdomen. TREATMENT  Treatment  usually requires a stay in the hospital. Treatment may include:  Pain medicine.  Fluid replacement through an intravenous line (IV).  Placing a tube in the stomach to remove stomach contents and control vomiting.  Not eating for 3 or 4 days. This gives your pancreas a rest, because enzymes are not being produced that can cause further  damage.  Antibiotic medicines if your condition is caused by an infection.  Surgery of the pancreas or gallbladder. HOME CARE INSTRUCTIONS   Follow the diet advised by your caregiver. This may involve avoiding alcohol and decreasing the amount of fat in your diet.  Eat smaller, more frequent meals. This reduces the amount of digestive juices the pancreas produces.  Drink enough fluids to keep your urine clear or pale yellow.  Only take over-the-counter or prescription medicines as directed by your caregiver.  Avoid drinking alcohol if it caused your condition.  Do not smoke.  Get plenty of rest.  Check your blood sugar at home as directed by your caregiver.  Keep all follow-up appointments as directed by your caregiver. SEEK MEDICAL CARE IF:   You do not recover as quickly as expected.  You develop new or worsening symptoms.  You have persistent pain, weakness, or nausea.  You recover and then have another episode of pain. SEEK IMMEDIATE MEDICAL CARE IF:   You are unable to eat or keep fluids down.  Your pain becomes severe.  You have a fever or persistent symptoms for more than 2 to 3 days.  You have a fever and your symptoms suddenly get worse.  Your skin or the white part of your eyes turn yellow (jaundice).  You develop vomiting.  You feel dizzy, or you faint.  Your blood sugar is high (over 300 mg/dL). MAKE SURE YOU:   Understand these instructions.  Will watch your condition.  Will get help right away if you are not doing well or get worse. Document Released: 12/26/2004 Document Revised: 06/27/2011 Document Reviewed: 04/06/2011 [facility] Patient Information 2014 [place].  Fatty Liver Fatty liver is the accumulation of fat in liver cells. It is also called hepatosteatosis or steatohepatitis. It is normal for your liver to contain some fat. If fat is more than 5 to 10% of your liver's weight, you have fatty liver.  There are often no symptoms  (problems) for years while damage is still occurring. People often learn about their fatty liver when they have medical tests for other reasons. Fat can damage your liver for years or even decades without causing problems. When it becomes severe, it can cause fatigue, weight loss, weakness, and confusion. This makes you more likely to develop more serious liver problems. The liver is the largest organ in the body. It does a lot of work and often gives no warning signs when it is sick until late in a disease. The liver has many important jobs including:  Breaking down foods.  Storing vitamins, iron, and other minerals.  Making proteins.  Making bile for food digestion.  Breaking down many products including medications, alcohol and some poisons. CAUSES  There are a number of different conditions, medications, and poisons that can cause a fatty liver. Eating too many calories causes fat to build up in the liver. Not processing and breaking fats down normally may also cause this. Certain conditions, such as obesity, diabetes, and high triglycerides also cause this. Most fatty liver patients tend to be middle-aged and over weight.  Some causes of fatty liver are:  Alcohol over consumption.  Malnutrition.  Steroid use.  Valproic acid toxicity.  Obesity.  Cushing's syndrome.  Poisons.  Tetracycline in high dosages.  Pregnancy.  Diabetes.  Hyperlipidemia.  Rapid weight loss. Some people develop fatty liver even having none of these conditions. SYMPTOMS  Fatty liver most often causes no problems. This is called asymptomatic.  It can be diagnosed with blood tests and also by a liver biopsy.  It is one of the most common causes of [place] elevations of liver enzymes on routine blood tests.  Specialized Imaging of the liver using ultrasound, CT (computed tomography) scan, or MRI (magnetic resonance imaging) can suggest a fatty liver but a biopsy is needed to confirm it.  A biopsy  involves taking a small sample of liver tissue. This is done by using a needle. It is then looked at under a microscope by a specialist. TREATMENT  It is important to treat the cause. Simple fatty liver without a medical reason may not need treatment.  Weight loss, fat restriction, and exercise in overweight patients produces inconsistent results but is worth trying.  Fatty liver due to alcohol toxicity may not improve even with stopping drinking.  Good control of diabetes may reduce fatty liver.  Lower your triglycerides through diet, medication or both.  Eat a balanced, healthy diet.  Increase your physical activity.  Get regular checkups from a liver specialist.  There are no medical or surgical treatments for a fatty liver or NASH, but improving your diet and increasing your exercise may help prevent or reverse some of the damage. PROGNOSIS  Fatty liver may cause no damage or it can lead to an inflammation of the liver. This is, called steatohepatitis. When it is linked to alcohol abuse, it is called alcoholic steatohepatitis. It often is not linked to alcohol. It is then called nonalcoholic steatohepatitis, or NASH. Over time the liver may become scarred and hardened. This condition is called cirrhosis. Cirrhosis is serious and may lead to liver failure or cancer. NASH is one of the leading causes of cirrhosis. About 10-20% of Americans have fatty liver and a smaller 2-5% has NASH. Document Released: 02/10/2005 Document Revised: 03/20/2011 Document Reviewed: 04/05/2005 [facility] Patient Information 2014 [place].  Nausea, Adult Nausea is the feeling that you have an upset stomach or have to vomit. Nausea by itself is not likely a serious concern, but it may be an early sign of more serious medical problems. As nausea gets worse, it can lead to vomiting. If vomiting develops, there is the risk of dehydration.  CAUSES   Viral infections.  Food  poisoning.  Medicines.  Pregnancy.  Motion sickness.  Migraine headaches.  Emotional distress.  Severe pain from any source.  Alcohol intoxication. HOME CARE INSTRUCTIONS  Get plenty of rest.  Ask your caregiver about specific rehydration instructions.  Eat small amounts of food and sip liquids more often.  Take all medicines as told by your caregiver. SEEK MEDICAL CARE IF:  You have not improved after 2 days, or you get worse.  You have a headache. SEEK IMMEDIATE MEDICAL CARE IF:   You have a fever.  You faint.  You keep vomiting or have blood in your vomit.  You are extremely weak or dehydrated.  You have dark or bloody stools.  You have severe chest or abdominal pain. MAKE SURE YOU:  Understand these instructions.  Will watch your condition.  Will get help right away if you are not doing well or get worse. Document  Released: 02/03/2004 Document Revised: 09/20/2011 Document Reviewed: 09/07/2010 [facility] Patient Information 2014 [place], [place].

## 2013-05-19 ENCOUNTER — Ambulatory Visit: Payer: Medicaid Other | Attending: Internal Medicine | Admitting: Internal Medicine

## 2013-05-19 ENCOUNTER — Encounter: Payer: Self-pay | Admitting: Internal Medicine

## 2013-05-19 VITALS — BP 122/83 | HR 93 | Temp 98.3°F | Resp 16 | Wt 360.0 lb

## 2013-05-19 DIAGNOSIS — K7689 Other specified diseases of liver: Secondary | ICD-10-CM | POA: Insufficient documentation

## 2013-05-19 DIAGNOSIS — E119 Type 2 diabetes mellitus without complications: Secondary | ICD-10-CM | POA: Insufficient documentation

## 2013-05-19 DIAGNOSIS — Z800 Family history of malignant neoplasm of digestive organs: Secondary | ICD-10-CM

## 2013-05-19 DIAGNOSIS — K760 Fatty (change of) liver, not elsewhere classified: Secondary | ICD-10-CM | POA: Insufficient documentation

## 2013-05-19 DIAGNOSIS — K219 Gastro-esophageal reflux disease without esophagitis: Secondary | ICD-10-CM

## 2013-05-19 DIAGNOSIS — IMO0001 Reserved for inherently not codable concepts without codable children: Secondary | ICD-10-CM | POA: Insufficient documentation

## 2013-05-19 DIAGNOSIS — E1165 Type 2 diabetes mellitus with hyperglycemia: Principal | ICD-10-CM

## 2013-05-19 DIAGNOSIS — J45909 Unspecified asthma, uncomplicated: Secondary | ICD-10-CM | POA: Insufficient documentation

## 2013-05-19 DIAGNOSIS — Z8719 Personal history of other diseases of the digestive system: Secondary | ICD-10-CM

## 2013-05-19 DIAGNOSIS — Z1211 Encounter for screening for malignant neoplasm of colon: Secondary | ICD-10-CM

## 2013-05-19 HISTORY — DX: Personal history of other diseases of the digestive system: Z87.19

## 2013-05-19 LAB — GLUCOSE, POCT (MANUAL RESULT ENTRY): POC GLUCOSE: 330 mg/dL — AB (ref 70–99)

## 2013-05-19 LAB — POCT GLYCOSYLATED HEMOGLOBIN (HGB A1C): Hemoglobin A1C: 9.4

## 2013-05-19 MED ORDER — FREESTYLE LANCETS MISC: Status: DC

## 2013-05-19 MED ORDER — GLIMEPIRIDE 4 MG PO TABS: 4.0000 mg | ORAL_TABLET | Freq: Two times a day (BID) | ORAL | Status: DC

## 2013-05-19 MED ORDER — INSULIN ASPART 100 UNIT/ML ~~LOC~~ SOLN
10.0000 [IU] | Freq: Once | SUBCUTANEOUS | Status: AC
  Administered 2013-05-19: 10 [IU] via SUBCUTANEOUS

## 2013-05-19 NOTE — Progress Notes (Signed)
MRN: [id number] Name: Tara Keller  Sex: female Age: 48 y.o. DOB: 1965/09/18  Allergies: Review of patient's allergies indicates no known allergies.  Chief Complaint  Patient presents with  . Follow-up    HPI: Patient is 48 y.o. female who comes today for followup her, she is history of diabetes, GERD, she also recently went to the emergency room with symptoms of abdominal pain, EMR reviewed patient was diagnosed with pancreatitis as well as CT abdomen reported to have severe hepatic steatosis and questionable liver lesion, she also reported to have family history of colon cancer, for diabetes she has been taking metformin  1 g twice a day as well as Amaryl 2 mg daily, as per patient her fasting sugar is usually high today her sugar is greater than 300 mg/dL, she was given insulin in office, she denies any hypoglycemic symptoms.   Past Medical History  Diagnosis Date  . Diabetes mellitus   . Asthma   . Hypertension   . Obesity   . Sleep apnea   . Arthritis   . H/O tubal ligation     Past Surgical History  Procedure Laterality Date  . Cholecystectomy    . Tubal ligation Bilateral       Medication List       This list is accurate as of: 05/19/13  2:36 PM.  Always use your most recent med list.               albuterol 108 (90 BASE) MCG/ACT inhaler  Commonly known as:  PROVENTIL HFA;VENTOLIN HFA  Inhale 1-2 puffs into the lungs every 6 (six) hours as needed for wheezing or shortness of breath.     cyclobenzaprine 10 MG tablet  Commonly known as:  FLEXERIL  Take 1 tablet (10 mg total) by mouth at bedtime.     dicyclomine 20 MG tablet  Commonly known as:  BENTYL  Take 1 tablet (20 mg total) by mouth every 6 (six) hours as needed for spasms (for abdominal cramping).     DULoxetine 60 MG capsule  Commonly known as:  CYMBALTA  Take 1 capsule (60 mg total) by mouth daily.     fluticasone 50 MCG/ACT nasal spray  Commonly known as:  FLONASE  Place 2 sprays into  both nostrils daily.     freestyle lancets  Use as instructed     glimepiride 4 MG tablet  Commonly known as:  AMARYL  Take 1 tablet (4 mg total) by mouth 2 (two) times daily.     glucose monitoring kit monitoring kit  1 each by Does not apply route as needed for other. Dispense any model that is covered- dispense testing supplies for Q AC/ HS accuchecks- 1 month supply with one refil.     meloxicam 15 MG tablet  Commonly known as:  MOBIC  Take 1 tablet (15 mg total) by mouth daily.     metFORMIN 500 MG 24 hr tablet  Commonly known as:  GLUCOPHAGE XR  Take 2 tablets (1,000 mg total) by mouth 2 (two) times daily with a meal.     MULTIVITAMIN PO  Take by mouth.     nicotine 14 mg/24hr patch  Commonly known as:  NICODERM CQ - dosed in mg/24 hours  Place 1 patch (14 mg total) onto the skin daily.     ondansetron 8 MG disintegrating tablet  Commonly known as:  ZOFRAN ODT  Take 1 tablet (8 mg total) by mouth every 8 (eight)  hours as needed for nausea or vomiting.     oxyCODONE 5 MG immediate release tablet  Commonly known as:  ROXICODONE  Take 1 tablet (5 mg total) by mouth every 4 (four) hours as needed for severe pain.     pantoprazole 40 MG tablet  Commonly known as:  PROTONIX  Take 1 tablet (40 mg total) by mouth daily.        Meds ordered this encounter  Medications  . insulin aspart (novoLOG) injection 10 Units    Sig:     Per office protocol  . glimepiride (AMARYL) 4 MG tablet    Sig: Take 1 tablet (4 mg total) by mouth 2 (two) times daily.    Dispense:  60 tablet    Refill:  3  . Lancets (FREESTYLE) lancets    Sig: Use as instructed    Dispense:  100 each    Refill:  12     There is no immunization history on file for this patient.  Family History  Problem Relation Age of Onset  . Diabetes Mother   . Stroke Mother   . Hypertension Mother   . Cancer Sister     History  Substance Use Topics  . Smoking status: Former Smoker -- 1.00 packs/day     Types: Cigarettes  . Smokeless tobacco: Never Used  . Alcohol Use: No    Review of Systems   As noted in HPI  Filed Vitals:   05/19/13 1410  BP: 122/83  Pulse: 93  Temp: 98.3 F (36.8 C)  Resp: 16    Physical Exam  Physical Exam  Constitutional: No distress.  HENT:  Head: Normocephalic and atraumatic.  Eyes: EOM are normal. Pupils are equal, round, and reactive to light.  Cardiovascular: Normal rate and regular rhythm.   Pulmonary/Chest: Breath sounds normal. No respiratory distress. She has no wheezes. She has no rales.  Musculoskeletal: She exhibits no edema.    CBC    Component Value Date/Time   WBC 10.6* 05/14/2013 2204   RBC 4.64 05/14/2013 2204   HGB 12.9 05/14/2013 2204   HCT 38.9 05/14/2013 2204   PLT 292 05/14/2013 2204   MCV 83.8 05/14/2013 2204   LYMPHSABS 3.8 05/14/2013 2204   MONOABS 0.6 05/14/2013 2204   EOSABS 0.3 05/14/2013 2204   BASOSABS 0.0 05/14/2013 2204    CMP     Component Value Date/Time   NA 136* 05/14/2013 2204   K 3.5* 05/14/2013 2204   CL 97 05/14/2013 2204   CO2 30 05/14/2013 2204   GLUCOSE 254* 05/14/2013 2204   BUN 8 05/14/2013 2204   CREATININE 0.60 05/14/2013 2204   CREATININE 0.80 07/31/2012 1648   CALCIUM 9.5 05/14/2013 2204   PROT 8.2 05/14/2013 2204   ALBUMIN 3.1* 05/14/2013 2204   AST 39* 05/14/2013 2204   ALT 62* 05/14/2013 2204   ALKPHOS 207* 05/14/2013 2204   BILITOT <0.2* 05/14/2013 2204   GFRNONAA >90 05/14/2013 2204   GFRAA >90 05/14/2013 2204    Lab Results  Component Value Date/Time   CHOL 127 08/15/2012 10:58 AM    No components found with this basename: hga1c    Lab Results  Component Value Date/Time   AST 39* 05/14/2013 10:04 PM    Assessment and Plan  DM (diabetes mellitus) - Results for orders placed in visit on 05/19/13  GLUCOSE, POCT (MANUAL RESULT ENTRY)      Result Value Ref Range   POC Glucose 330 (*) 70 - 99 mg/dl  POCT GLYCOSYLATED HEMOGLOBIN (HGB A1C)      Result Value Ref Range   Hemoglobin A1C 9.4     Diabetes is  uncontrolled her hemoglobin A1c is trending up, I have increased the dose of Amaryl to 4 mg twice a day, she'll continue with metformin 1 g twice a day, advise patient to keep the fingerstick log. Advised about diabetes meal planning.   Plan: Glucose (CBG), HgB A1c, insulin aspart (novoLOG) injection 10 Units, glimepiride (AMARYL) 4 MG tablet, Lancets (FREESTYLE) lancets  Hepatic steatosis - Plan: Ambulatory referral to Gastroenterology  History of pancreatitis - Plan: Ambulatory referral to Gastroenterology  Special screening for malignant neoplasms, colon - Plan: Ambulatory referral to Gastroenterology  Family history of colon cancer - Plan: Ambulatory referral to Gastroenterology.  GERD  Patient is taking Protonix which helps her with the symptoms.  Return in about 3 months (around 08/19/2013) for diabetes.  Lorayne Marek, MD

## 2013-05-19 NOTE — Patient Instructions (Signed)

## 2013-05-19 NOTE — Progress Notes (Signed)
Patient was recently seen in the ED for abd pain Was diagnosed with pancreatitis, and a fatty liver Patient is concerned about her liver

## 2013-05-23 ENCOUNTER — Encounter: Payer: Self-pay | Admitting: Internal Medicine

## 2013-05-26 ENCOUNTER — Telehealth: Payer: Self-pay

## 2013-05-26 DIAGNOSIS — K219 Gastro-esophageal reflux disease without esophagitis: Secondary | ICD-10-CM

## 2013-05-26 MED ORDER — PANTOPRAZOLE SODIUM 40 MG PO TBEC: 40.0000 mg | DELAYED_RELEASE_TABLET | Freq: Every day | ORAL | Status: DC

## 2013-05-26 NOTE — Telephone Encounter (Signed)
Returned patient phone call Patient not available Left message on voice mail to return our call 

## 2013-05-26 NOTE — Telephone Encounter (Signed)
Patient called requesting refills on her medications Refills sent to [facility]

## 2013-06-11 ENCOUNTER — Telehealth: Payer: Self-pay

## 2013-06-11 DIAGNOSIS — E119 Type 2 diabetes mellitus without complications: Secondary | ICD-10-CM

## 2013-06-11 MED ORDER — ACCU-CHEK SOFT TOUCH LANCETS MISC: Status: DC

## 2013-06-11 MED ORDER — GLUCOSE BLOOD VI STRP: ORAL_STRIP | Status: DC

## 2013-06-11 NOTE — Telephone Encounter (Signed)
Patient called needing a refill on her accuchek test strips and lancets Prescription sent to [facility] on [address]

## 2013-06-13 ENCOUNTER — Telehealth: Payer: Self-pay

## 2013-06-13 ENCOUNTER — Other Ambulatory Visit: Payer: Self-pay

## 2013-06-13 DIAGNOSIS — E119 Type 2 diabetes mellitus without complications: Secondary | ICD-10-CM

## 2013-06-13 MED ORDER — GLUCOSE BLOOD VI STRP: ORAL_STRIP | Status: DC

## 2013-06-13 NOTE — Telephone Encounter (Signed)
Patient called stating her blood sugar is elevated at over 360 She wanted to know should she go to the ED i instructed her she could come in as a walk in this afternoon

## 2013-06-17 ENCOUNTER — Telehealth: Payer: Self-pay

## 2013-06-17 DIAGNOSIS — E119 Type 2 diabetes mellitus without complications: Secondary | ICD-10-CM

## 2013-06-17 MED ORDER — ACCU-CHEK SOFT TOUCH LANCETS MISC: Status: DC

## 2013-06-17 NOTE — Telephone Encounter (Signed)
Patient called needing refill on her lancets for DM Also needs form filled out for transportation [employer] of social services to get them to fax the form to our office [phone] Spoke with [name] Fanny at transportation and they will continue to provide services for the client

## 2013-06-24 ENCOUNTER — Telehealth: Payer: Self-pay

## 2013-06-24 NOTE — Telephone Encounter (Signed)
Returned patient's phone Patient not available Left message on voice mail to return my call

## 2013-06-27 ENCOUNTER — Telehealth: Payer: Self-pay | Admitting: Internal Medicine

## 2013-06-27 NOTE — Telephone Encounter (Signed)
Pt has called in today to request a office visit and a phone call with her nurse; please f/u with pt about her concerns

## 2013-06-30 ENCOUNTER — Telehealth: Payer: Self-pay | Admitting: Internal Medicine

## 2013-06-30 NOTE — Telephone Encounter (Signed)
dr's note for her job, pt is diabetic and needs to use bathroom often Pt f/u with pt at [phone]

## 2013-07-01 ENCOUNTER — Telehealth: Payer: Self-pay | Admitting: Internal Medicine

## 2013-07-01 NOTE — Telephone Encounter (Signed)
Returned patient phone call Patient not available Left message on voice mail to return our call 

## 2013-07-01 NOTE — Telephone Encounter (Signed)
Pt employer was contacted and the forms stating the patients medical diagnosis can be sent directly to her email address; [email or web address]

## 2013-07-01 NOTE — Telephone Encounter (Signed)
Pt requesting doctor note for freq restroom visits due to Diabetes. Please f/u

## 2013-07-02 ENCOUNTER — Telehealth: Payer: Self-pay

## 2013-07-02 NOTE — Telephone Encounter (Signed)
Patient needs a note for work stating she  Needs to frequent the ladies room because  Of her diabetes. Is this something you can write for her?

## 2013-07-14 ENCOUNTER — Telehealth: Payer: Self-pay

## 2013-07-14 NOTE — Telephone Encounter (Signed)
Patient called to inquire about here upcoming Appointment with GI office Patient is aware of her appt on 07/21/13 @ 1:30pm

## 2013-07-16 ENCOUNTER — Encounter: Payer: Self-pay | Admitting: Internal Medicine

## 2013-07-21 ENCOUNTER — Other Ambulatory Visit (INDEPENDENT_AMBULATORY_CARE_PROVIDER_SITE_OTHER): Payer: Medicaid Other

## 2013-07-21 ENCOUNTER — Encounter: Payer: Self-pay | Admitting: Internal Medicine

## 2013-07-21 ENCOUNTER — Ambulatory Visit (INDEPENDENT_AMBULATORY_CARE_PROVIDER_SITE_OTHER): Payer: Medicaid Other | Admitting: Internal Medicine

## 2013-07-21 VITALS — BP 134/80 | HR 76 | Ht 66.0 in | Wt 371.0 lb

## 2013-07-21 DIAGNOSIS — K219 Gastro-esophageal reflux disease without esophagitis: Secondary | ICD-10-CM

## 2013-07-21 DIAGNOSIS — Z800 Family history of malignant neoplasm of digestive organs: Secondary | ICD-10-CM

## 2013-07-21 DIAGNOSIS — R748 Abnormal levels of other serum enzymes: Secondary | ICD-10-CM

## 2013-07-21 DIAGNOSIS — R932 Abnormal findings on diagnostic imaging of liver and biliary tract: Secondary | ICD-10-CM | POA: Diagnosis not present

## 2013-07-21 DIAGNOSIS — R110 Nausea: Secondary | ICD-10-CM

## 2013-07-21 DIAGNOSIS — R933 Abnormal findings on diagnostic imaging of other parts of digestive tract: Secondary | ICD-10-CM | POA: Diagnosis not present

## 2013-07-21 DIAGNOSIS — R1013 Epigastric pain: Secondary | ICD-10-CM

## 2013-07-21 DIAGNOSIS — R7989 Other specified abnormal findings of blood chemistry: Secondary | ICD-10-CM

## 2013-07-21 DIAGNOSIS — K769 Liver disease, unspecified: Secondary | ICD-10-CM

## 2013-07-21 DIAGNOSIS — K760 Fatty (change of) liver, not elsewhere classified: Secondary | ICD-10-CM

## 2013-07-21 DIAGNOSIS — R945 Abnormal results of liver function studies: Secondary | ICD-10-CM

## 2013-07-21 DIAGNOSIS — K7689 Other specified diseases of liver: Secondary | ICD-10-CM

## 2013-07-21 LAB — CBC WITH DIFFERENTIAL/PLATELET
BASOS ABS: 0.1 10*3/uL (ref 0.0–0.1)
Basophils Relative: 0.6 % (ref 0.0–3.0)
EOS PCT: 3.9 % (ref 0.0–5.0)
Eosinophils Absolute: 0.4 10*3/uL (ref 0.0–0.7)
HEMATOCRIT: 40.3 % (ref 36.0–46.0)
HEMOGLOBIN: 12.7 g/dL (ref 12.0–15.0)
LYMPHS ABS: 3.3 10*3/uL (ref 0.7–4.0)
Lymphocytes Relative: 33.9 % (ref 12.0–46.0)
MCHC: 31.6 g/dL (ref 30.0–36.0)
MCV: 83.2 fl (ref 78.0–100.0)
Monocytes Absolute: 0.5 10*3/uL (ref 0.1–1.0)
Monocytes Relative: 5.5 % (ref 3.0–12.0)
Neutro Abs: 5.5 10*3/uL (ref 1.4–7.7)
Neutrophils Relative %: 56.1 % (ref 43.0–77.0)
Platelets: 327 10*3/uL (ref 150.0–400.0)
RBC: 4.85 Mil/uL (ref 3.87–5.11)
RDW: 14.2 % (ref 11.5–15.5)
WBC: 9.8 10*3/uL (ref 4.0–10.5)

## 2013-07-21 LAB — PROTIME-INR
INR: 1 ratio (ref 0.8–1.0)
Prothrombin Time: 11.4 s (ref 9.6–13.1)

## 2013-07-21 MED ORDER — SUCRALFATE 1 GM/10ML PO SUSP: 1.0000 g | Freq: Three times a day (TID) | ORAL | Status: DC

## 2013-07-21 NOTE — Progress Notes (Signed)
Patient ID: Tara Keller, female   DOB: 01/24/1965, 48 y.o.   MRN: [id number] HPI: Tara Keller is a 48-year-old female with a past medical history of gallstones status post cholecystectomy, hypertension, diabetes, obesity, sleep apnea not currently using CPAP, who is seen in consultation at the request of Dr. Advani to evaluate epigastric pain and recent imaging suggesting fatty liver with elevated liver enzymes. She is here alone today. She reports for the last several months she has had trouble with epigastric abdominal pain which radiates around each side and through to her back. This led to an ER visit in May and she was told she may have pancreatitis, fatty liver with elevated liver enzymes. Her pain is much worse when she eats fried foods or foods high in fat. She's had associated nausea, but rarely vomits. Her nausea and pain seems to be worse at night. She reports heartburn but this has been improved with pantoprazole 40 mg daily. She reports her bowel movements vary from constipated to loose. She has seen some black stool. She denies red blood or hematochezia. She reports a prior cholecystectomy performed in 2009. She denies weight loss in fact she has noted some weight gain. Family history notable for maternal grandfather with colon cancer, and breast cancer in her sister and maternal grandmother. She reports she is up-to-date on her breast cancer screening. Med list contains meloxicam but she states she is not currently taking this medicine. She denies a family history of liver disease. No history of jaundice, itching, known ascites or lower extremity edema  Past Medical History  Diagnosis Date  . Diabetes mellitus   . Asthma   . Hypertension   . Obesity   . Sleep apnea   . Arthritis   . H/O tubal ligation     Past Surgical History  Procedure Laterality Date  . Cholecystectomy    . Tubal ligation Bilateral     Outpatient Prescriptions Prior to Visit  Medication Sig Dispense Refill  .  albuterol (PROVENTIL HFA;VENTOLIN HFA) 108 (90 BASE) MCG/ACT inhaler Inhale 1-2 puffs into the lungs every 6 (six) hours as needed for wheezing or shortness of breath.  1 Inhaler  0  . cyclobenzaprine (FLEXERIL) 10 MG tablet Take 1 tablet (10 mg total) by mouth at bedtime.  30 tablet  1  . dicyclomine (BENTYL) 20 MG tablet Take 1 tablet (20 mg total) by mouth every 6 (six) hours as needed for spasms (for abdominal cramping).  20 tablet  0  . DULoxetine (CYMBALTA) 60 MG capsule Take 1 capsule (60 mg total) by mouth daily.  30 capsule  3  . fluticasone (FLONASE) 50 MCG/ACT nasal spray Place 2 sprays into both nostrils daily.  16 g  6  . glimepiride (AMARYL) 4 MG tablet Take 1 tablet (4 mg total) by mouth 2 (two) times daily.  60 tablet  3  . glucose blood (ACCU-CHEK ACTIVE STRIPS) test strip Use as instructed  100 each  12  . glucose monitoring kit (FREESTYLE) monitoring kit 1 each by Does not apply route as needed for other. Dispense any model that is covered- dispense testing supplies for Q AC/ HS accuchecks- 1 month supply with one refil.  1 each  1  . Lancets (ACCU-CHEK SOFT TOUCH) lancets Use as instructed  100 each  12  . Lancets (FREESTYLE) lancets Use as instructed  100 each  12  . meloxicam (MOBIC) 15 MG tablet Take 1 tablet (15 mg total) by mouth daily.  30   tablet  3  . metFORMIN (GLUCOPHAGE XR) 500 MG 24 hr tablet Take 2 tablets (1,000 mg total) by mouth 2 (two) times daily with a meal.  120 tablet  3  . Multiple Vitamins-Minerals (MULTIVITAMIN PO) Take by mouth.      . nicotine (NICODERM CQ - DOSED IN MG/24 HOURS) 14 mg/24hr patch Place 1 patch (14 mg total) onto the skin daily.  28 patch  0  . ondansetron (ZOFRAN ODT) 8 MG disintegrating tablet Take 1 tablet (8 mg total) by mouth every 8 (eight) hours as needed for nausea or vomiting.  20 tablet  0  . pantoprazole (PROTONIX) 40 MG tablet Take 1 tablet (40 mg total) by mouth daily.  30 tablet  3  . oxyCODONE (ROXICODONE) 5 MG immediate  release tablet Take 1 tablet (5 mg total) by mouth every 4 (four) hours as needed for severe pain.  20 tablet  0   No facility-administered medications prior to visit.    No Known Allergies  Family History  Problem Relation Age of Onset  . Diabetes Mother   . Stroke Mother   . Hypertension Mother   . Cancer Sister     History  Substance Use Topics  . Smoking status: Former Smoker -- 1.00 packs/day    Types: Cigarettes  . Smokeless tobacco: Never Used  . Alcohol Use: No    ROS: As per history of present illness, otherwise negative  BP 134/80  Pulse 76  Ht 5' 6" (1.676 m)  Wt 371 lb (168.284 kg)  BMI 59.91 kg/m2  SpO2 98% Constitutional: Well-developed and well-nourished. No distress. HEENT: Normocephalic and atraumatic. Oropharynx is clear and moist. No oropharyngeal exudate. Conjunctivae are normal.  No scleral icterus. Neck: Neck supple. Trachea midline. Cardiovascular: Normal rate, regular rhythm and intact distal pulses. No M/R/G Pulmonary/chest: Effort normal and breath sounds normal. No wheezing, rales or rhonchi. Abdominal: Soft, nontender, nondistended. Bowel sounds active throughout. There are no masses palpable. No hepatosplenomegaly. Extremities: no clubbing, cyanosis, or edema Lymphadenopathy: No cervical adenopathy noted. Neurological: Alert and oriented to person place and time. Skin: Skin is warm and dry. No rashes noted. Psychiatric: Normal mood and affect. Behavior is normal.  RELEVANT LABS AND IMAGING: CBC    Component Value Date/Time   WBC 10.6* 05/14/2013 2204   RBC 4.64 05/14/2013 2204   HGB 12.9 05/14/2013 2204   HCT 38.9 05/14/2013 2204   PLT 292 05/14/2013 2204   MCV 83.8 05/14/2013 2204   MCH 27.8 05/14/2013 2204   MCHC 33.2 05/14/2013 2204   RDW 13.9 05/14/2013 2204   LYMPHSABS 3.8 05/14/2013 2204   MONOABS 0.6 05/14/2013 2204   EOSABS 0.3 05/14/2013 2204   BASOSABS 0.0 05/14/2013 2204    CMP     Component Value Date/Time   NA 136* 05/14/2013 2204   K  3.5* 05/14/2013 2204   CL 97 05/14/2013 2204   CO2 30 05/14/2013 2204   GLUCOSE 254* 05/14/2013 2204   BUN 8 05/14/2013 2204   CREATININE 0.60 05/14/2013 2204   CREATININE 0.80 07/31/2012 1648   CALCIUM 9.5 05/14/2013 2204   PROT 8.2 05/14/2013 2204   ALBUMIN 3.1* 05/14/2013 2204   AST 39* 05/14/2013 2204   ALT 62* 05/14/2013 2204   ALKPHOS 207* 05/14/2013 2204   BILITOT <0.2* 05/14/2013 2204   GFRNONAA >90 05/14/2013 2204   GFRAA >90 05/14/2013 2204   Lab Results  Component Value Date   LIPASE 205* 05/14/2013   CT ABDOMEN AND PELVIS   WITH CONTRAST   TECHNIQUE: Multidetector CT imaging of the abdomen and pelvis was performed using the standard protocol following bolus administration of intravenous contrast.   CONTRAST:  100mL OMNIPAQUE IOHEXOL 300 MG/ML  SOLN   COMPARISON:  06/25/2006   FINDINGS: BODY WALL: Unremarkable.   LOWER CHEST: Unremarkable.   ABDOMEN/PELVIS:   Liver: Severe fatty infiltration. A 9 mm hypervascular focus in the upper right liver is not seen on delayed imaging, and was not seen on non contrast imaging from 2008.   Biliary: Cholecystectomy.   Pancreas: Unremarkable.   Spleen: Unremarkable.   Adrenals: Unremarkable.   Kidneys and ureters: 1 cm cyst in the lower pole right kidney.   Bladder: Unremarkable.   Reproductive: Sub serosal fibroid from the uterine fundus, 3 cm in diameter. Intramural left body fibroid, 2.4 cm.   Bowel: No obstruction. Normal appendix.   Retroperitoneum: No mass or adenopathy.   Peritoneum: No free fluid or gas.   Vascular: No acute abnormality.   OSSEOUS: No acute abnormalities.   IMPRESSION: 1. No acute intra-abdominal findings. 2. Severe hepatic steatosis. 3. 9 mm hypervascular lesion in the upper right liver is likely a flash fill hemangioma, although strictly indeterminate on single phase examination. If there is chronic liver disease, follow-up liver MRI in 6 months is recommended. 4. Fibroid uterus.     Electronically  Signed   By: Jonathan  Watts M.D.   On: 05/15/2013 02:06  ASSESSMENT/PLAN: 47-year-old female with a past medical history of gallstones status post cholecystectomy, hypertension, diabetes, obesity, sleep apnea not currently using CPAP, who is seen in consultation at the request of Dr. Advani to evaluate epigastric pain and recent imaging suggesting fatty liver with elevated liver enzymes  1. Epigastric abd pain/nausea/elevated lipase -- her pain could be related to pancreatic inflammation though no true pancreatitis was seen by CT in May 2015. Her lipase was elevated approximately 4 times the upper limit of normal. I am concerned about the possibility of a retained gallstone given the concern of pancreatitis, her history of gallstones, and ongoing abdominal pain with elevated LFTs. I have recommended that she continue pantoprazole 40 mg daily. I will add Carafate before meals and at bedtime in case this is gastritis or duodenitis. I recommended cross-sectional imaging with open MRI of the abdomen and pelvis with contrast plus MRCP. This will better evaluate the pancreas, bile ducts, and the liver given the 9 mm hypervascular lesion seen by CT. After imaging, if unremarkable, I recommended upper endoscopy for direct visualization. We discussed the test including the risks and benefits and due to her BMI this will be performed with monitored anesthesia care in hospital setting  2.  Elevated liver enzymes/9 mm liver lesion -- elevated transaminases and alkaline phosphatase. Fatty liver is been diagnosed by CT, but we'll perform other liver test to rule out other causes of inflammation such as viral hepatitis, iron studies, ANA, AMA, IgG, ASMA.  Treatment of fatty liver his weight loss, control of blood sugars, diet and exercise. Further recommendation after imaging and blood tests  3.  Colorectal cancer screening/family history of colon cancer -- I recommended colonoscopy for colorectal cancer screening.  This will be performed in the hospital setting with monitored anesthesia care. The test was discussed including the risks and benefits and she is agreeable to proceed      

## 2013-07-21 NOTE — Patient Instructions (Signed)
You have been scheduled for an MRI at [facility]  on 07/23/2013. Your appointment time is 12:45pm. Please arrive 15 minutes prior to your appointment time for registration purposes. Please make certain not to have anything to eat or drink 6 hours prior to your test. In addition, if you have any metal in your body, have a pacemaker or defibrillator, please be sure to let your ordering physician know. This test typically takes 45 minutes to 1 hour to complete.  Separate instructions given for Endo/Colon scheduled on 08/15/2013 at 9am Go to the basement for labs  Continue Protonix Take carafate daily three times a day at meals and at bedtime

## 2013-07-22 LAB — IBC PANEL
Iron: 48 ug/dL (ref 42–145)
Saturation Ratios: 11.4 % — ABNORMAL LOW (ref 20.0–50.0)
Transferrin: 300.2 mg/dL (ref 212.0–360.0)

## 2013-07-22 LAB — COMPREHENSIVE METABOLIC PANEL
ALT: 72 U/L — ABNORMAL HIGH (ref 0–35)
AST: 48 U/L — ABNORMAL HIGH (ref 0–37)
Albumin: 3.5 g/dL (ref 3.5–5.2)
Alkaline Phosphatase: 172 U/L — ABNORMAL HIGH (ref 39–117)
BILIRUBIN TOTAL: 0.3 mg/dL (ref 0.2–1.2)
BUN: 6 mg/dL (ref 6–23)
CO2: 29 meq/L (ref 19–32)
Calcium: 9.4 mg/dL (ref 8.4–10.5)
Chloride: 100 mEq/L (ref 96–112)
Creatinine, Ser: 0.6 mg/dL (ref 0.4–1.2)
GFR: 136.1 mL/min (ref >60.00)
GLUCOSE: 159 mg/dL — AB (ref 70–99)
Potassium: 3.4 mEq/L — ABNORMAL LOW (ref 3.5–5.1)
Sodium: 137 mEq/L (ref 135–145)
Total Protein: 8 g/dL (ref 6.0–8.3)

## 2013-07-22 LAB — LIPASE: Lipase: 29 U/L (ref 11.0–59.0)

## 2013-07-22 LAB — HEPATITIS C ANTIBODY: HCV Ab: NEGATIVE

## 2013-07-22 LAB — IGG: IgG (Immunoglobin G), Serum: 1610 mg/dL (ref 690–1700)

## 2013-07-22 LAB — AMYLASE: Amylase: 40 U/L (ref 27–131)

## 2013-07-22 LAB — IRON: Iron: 48 ug/dL (ref 42–145)

## 2013-07-22 LAB — HEPATITIS B SURFACE ANTIBODY,QUALITATIVE: Hep B S Ab: POSITIVE — AB

## 2013-07-22 LAB — MITOCHONDRIAL ANTIBODIES: Mitochondrial M2 Ab, IgG: 0.39 (ref <0.91)

## 2013-07-22 LAB — FERRITIN: Ferritin: 68.4 ng/mL (ref 10.0–291.0)

## 2013-07-22 LAB — HEPATITIS A ANTIBODY, TOTAL: HEP A TOTAL AB: NONREACTIVE

## 2013-07-22 LAB — ANA: Anti Nuclear Antibody(ANA): NEGATIVE

## 2013-07-22 LAB — HEPATITIS B CORE ANTIBODY, IGM: HEP B C IGM: NONREACTIVE

## 2013-07-22 LAB — ANTI-SMOOTH MUSCLE ANTIBODY, IGG: Smooth Muscle Ab: 6 U

## 2013-07-22 LAB — HEPATITIS B SURFACE ANTIGEN: Hepatitis B Surface Ag: NEGATIVE

## 2013-07-23 ENCOUNTER — Ambulatory Visit: Payer: Medicaid Other | Admitting: Internal Medicine

## 2013-07-23 ENCOUNTER — Other Ambulatory Visit: Payer: Medicaid Other

## 2013-07-23 LAB — CERULOPLASMIN: Ceruloplasmin: 38 mg/dL (ref 18–53)

## 2013-07-24 ENCOUNTER — Encounter (HOSPITAL_COMMUNITY): Payer: Self-pay | Admitting: Pharmacy Technician

## 2013-07-29 ENCOUNTER — Ambulatory Visit
Admission: RE | Admit: 2013-07-29 | Discharge: 2013-07-29 | Disposition: A | Discharge status: Home / Self Care | Payer: Medicaid Other | Source: Ambulatory Visit | Attending: Internal Medicine | Admitting: Internal Medicine

## 2013-07-29 DIAGNOSIS — R932 Abnormal findings on diagnostic imaging of liver and biliary tract: Secondary | ICD-10-CM

## 2013-07-29 MED ORDER — GADOBENATE DIMEGLUMINE 529 MG/ML IV SOLN
20.0000 mL | Freq: Once | INTRAVENOUS | Status: AC | PRN
  Administered 2013-07-29: 20 mL via INTRAVENOUS

## 2013-08-06 ENCOUNTER — Telehealth: Payer: Self-pay | Admitting: Internal Medicine

## 2013-08-06 MED ORDER — MOVIPREP 100 G PO SOLR: 1.0000 | Freq: Once | ORAL | Status: DC

## 2013-08-06 NOTE — Telephone Encounter (Signed)
Prep sent to pharmacy. Patient advised.

## 2013-08-07 ENCOUNTER — Encounter (HOSPITAL_COMMUNITY): Payer: Self-pay | Admitting: *Deleted

## 2013-08-15 ENCOUNTER — Encounter (HOSPITAL_COMMUNITY)
Admission: RE | Disposition: A | Discharge status: Home / Self Care | Payer: Self-pay | Source: Ambulatory Visit | Attending: Internal Medicine

## 2013-08-15 ENCOUNTER — Ambulatory Visit (HOSPITAL_COMMUNITY)
Admission: RE | Admit: 2013-08-15 | Discharge: 2013-08-15 | Disposition: A | Discharge status: Home / Self Care | Payer: Medicaid Other | Source: Ambulatory Visit | Attending: Internal Medicine | Admitting: Internal Medicine

## 2013-08-15 ENCOUNTER — Ambulatory Visit (HOSPITAL_COMMUNITY): Payer: Medicaid Other | Admitting: Anesthesiology

## 2013-08-15 ENCOUNTER — Encounter (HOSPITAL_COMMUNITY): Payer: Medicaid Other | Admitting: Anesthesiology

## 2013-08-15 ENCOUNTER — Encounter (HOSPITAL_COMMUNITY): Payer: Self-pay | Admitting: *Deleted

## 2013-08-15 DIAGNOSIS — D126 Benign neoplasm of colon, unspecified: Secondary | ICD-10-CM | POA: Diagnosis not present

## 2013-08-15 DIAGNOSIS — F17200 Nicotine dependence, unspecified, uncomplicated: Secondary | ICD-10-CM | POA: Insufficient documentation

## 2013-08-15 DIAGNOSIS — I10 Essential (primary) hypertension: Secondary | ICD-10-CM | POA: Insufficient documentation

## 2013-08-15 DIAGNOSIS — G4730 Sleep apnea, unspecified: Secondary | ICD-10-CM | POA: Diagnosis not present

## 2013-08-15 DIAGNOSIS — Z01818 Encounter for other preprocedural examination: Secondary | ICD-10-CM

## 2013-08-15 DIAGNOSIS — J45909 Unspecified asthma, uncomplicated: Secondary | ICD-10-CM | POA: Insufficient documentation

## 2013-08-15 DIAGNOSIS — E119 Type 2 diabetes mellitus without complications: Secondary | ICD-10-CM | POA: Insufficient documentation

## 2013-08-15 DIAGNOSIS — R932 Abnormal findings on diagnostic imaging of liver and biliary tract: Secondary | ICD-10-CM

## 2013-08-15 DIAGNOSIS — F411 Generalized anxiety disorder: Secondary | ICD-10-CM | POA: Diagnosis not present

## 2013-08-15 DIAGNOSIS — Z1211 Encounter for screening for malignant neoplasm of colon: Secondary | ICD-10-CM | POA: Diagnosis not present

## 2013-08-15 DIAGNOSIS — A048 Other specified bacterial intestinal infections: Secondary | ICD-10-CM | POA: Diagnosis not present

## 2013-08-15 DIAGNOSIS — Z800 Family history of malignant neoplasm of digestive organs: Secondary | ICD-10-CM

## 2013-08-15 DIAGNOSIS — K2940 Chronic atrophic gastritis without bleeding: Secondary | ICD-10-CM | POA: Insufficient documentation

## 2013-08-15 DIAGNOSIS — K219 Gastro-esophageal reflux disease without esophagitis: Secondary | ICD-10-CM | POA: Diagnosis not present

## 2013-08-15 DIAGNOSIS — R1013 Epigastric pain: Secondary | ICD-10-CM

## 2013-08-15 DIAGNOSIS — Z0000 Encounter for general adult medical examination without abnormal findings: Secondary | ICD-10-CM

## 2013-08-15 HISTORY — DX: Depression, unspecified: F32.A

## 2013-08-15 HISTORY — DX: Unspecified fracture of unspecified forearm, initial encounter for closed fracture: S52.90XA

## 2013-08-15 HISTORY — DX: Major depressive disorder, single episode, unspecified: F32.9

## 2013-08-15 HISTORY — DX: Unspecified fracture of left lower leg, initial encounter for closed fracture: S82.92XA

## 2013-08-15 HISTORY — PX: ESOPHAGOGASTRODUODENOSCOPY (EGD) WITH PROPOFOL: SHX5813

## 2013-08-15 HISTORY — DX: Anxiety disorder, unspecified: F41.9

## 2013-08-15 HISTORY — PX: COLONOSCOPY WITH PROPOFOL: SHX5780

## 2013-08-15 LAB — GLUCOSE, CAPILLARY: GLUCOSE-CAPILLARY: 260 mg/dL — AB (ref 70–99)

## 2013-08-15 SURGERY — COLONOSCOPY WITH PROPOFOL
Anesthesia: Monitor Anesthesia Care

## 2013-08-15 MED ORDER — PROPOFOL 10 MG/ML IV BOLUS
INTRAVENOUS | Status: DC | PRN
  Administered 2013-08-15: 50 mg via INTRAVENOUS
  Administered 2013-08-15: 25 mg via INTRAVENOUS
  Administered 2013-08-15 (×5): 50 mg via INTRAVENOUS
  Administered 2013-08-15: 25 mg via INTRAVENOUS
  Administered 2013-08-15 (×4): 50 mg via INTRAVENOUS

## 2013-08-15 MED ORDER — PROPOFOL 10 MG/ML IV BOLUS
INTRAVENOUS | Status: AC
  Filled 2013-08-15: qty 20

## 2013-08-15 MED ORDER — LACTATED RINGERS IV SOLN
INTRAVENOUS | Status: DC
Start: 2013-08-15 — End: 2013-08-15
  Administered 2013-08-15: 08:00:00 via INTRAVENOUS

## 2013-08-15 MED ORDER — SODIUM CHLORIDE 0.9 % IV SOLN: INTRAVENOUS | Status: DC

## 2013-08-15 MED ORDER — LACTATED RINGERS IV SOLN
INTRAVENOUS | Status: DC | PRN
  Administered 2013-08-15: 08:00:00 via INTRAVENOUS

## 2013-08-15 SURGICAL SUPPLY — 25 items

## 2013-08-15 NOTE — Transfer of Care (Signed)
Immediate Anesthesia Transfer of Care Note  Patient: Tara Keller  Procedure(s) Performed: Procedure(s): COLONOSCOPY WITH PROPOFOL (N/A) ESOPHAGOGASTRODUODENOSCOPY (EGD) WITH PROPOFOL (N/A)  Patient Location: PACU  Anesthesia Type:MAC  Level of Consciousness: awake, sedated and patient cooperative  Airway & Oxygen Therapy: Patient Spontanous Breathing and Patient connected to face mask oxygen  Post-op Assessment: Report given to PACU RN and Post -op Vital signs reviewed and stable  Post vital signs: Reviewed and stable  Complications: No apparent anesthesia complications

## 2013-08-15 NOTE — Op Note (Signed)
[facility] [place], [address]   COLONOSCOPY PROCEDURE REPORT  PATIENT: Tara, Keller  MR#: [id number] BIRTHDATE: 1965-12-23 , 48  yrs. old GENDER: Female ENDOSCOPIST: Jerene Bears, MD PROCEDURE DATE:  08/15/2013 PROCEDURE:   Colonoscopy with cold biopsy polypectomy and Colonoscopy with snare polypectomy First Screening Colonoscopy - Avg.  risk and is 50 yrs.  old or older - No.  Prior Negative Screening - Now for repeat screening. N/A  History of Adenoma - Now for follow-up colonoscopy & has been > or = to 3 yrs.  N/A  Polyps Removed Today? Yes. ASA CLASS:   Class III INDICATIONS:elevated risk screening and Patient's maternal grand-father with CRC MEDICATIONS: MAC sedation, administered by CRNA and See Anesthesia Report.  DESCRIPTION OF PROCEDURE:   After the risks benefits and alternatives of the procedure were thoroughly explained, informed consent was obtained.  A digital rectal exam revealed no rectal mass.   The Pentax Ped Colon [id number]  endoscope was introduced through the anus and advanced to the cecum, which was identified by both the appendix and ileocecal valve. No adverse events experienced.   The quality of the prep was good, using MoviPrep The instrument was then slowly withdrawn as the colon was fully examined.  COLON FINDINGS: A sessile polyp measuring 2 mm in size was found at the appendiceal orifice.  A polypectomy was performed with cold forceps.  The resection was complete and the polyp tissue was completely retrieved.   A sessile polyp measuring 5 mm in size was found in the sigmoid colon.  A polypectomy was performed with a cold snare.  The resection was complete and the polyp tissue was completely retrieved.   The colon mucosa was otherwise normal. Retroflexed views revealed no abnormalities. The time to cecum=minutes 0 seconds.  Withdrawal time=minutes 0 seconds.  The scope was withdrawn and the procedure  completed. COMPLICATIONS: There were no complications.  ENDOSCOPIC IMPRESSION: 1.   Sessile polyp measuring 2 mm in size was found at the appendiceal orifice; polypectomy was performed with cold forceps 2.   Sessile polyp measuring 5 mm in size was found in the sigmoid colon; polypectomy was performed with a cold snare 3.   The colon mucosa was otherwise normal  RECOMMENDATIONS: 1.  Await pathology results 2.  If the polyps removed today are proven to be adenomatous (pre-cancerous) polyps, you will need a repeat colonoscopy in 5 years.  Otherwise you should continue to follow colorectal cancer screening guidelines for "routine risk" patients with colonoscopy in 10 years.  You will receive a letter within 1-2 weeks with the results of your biopsy as well as final recommendations.  Please call my office if you have not received a letter after 3 weeks.   eSigned:  Jerene Bears, MD 08/15/2013 10:01 AM  cc: The Patient; Dr. Annitta Needs, MD

## 2013-08-15 NOTE — Anesthesia Preprocedure Evaluation (Addendum)
Anesthesia Evaluation  Patient identified by MRN, date of birth, ID band Patient awake    Reviewed: Allergy & Precautions, H&P , NPO status , Patient's Chart, lab work & pertinent test results  Airway Mallampati: II TM Distance: >3 FB Neck ROM: Full    Dental no notable dental hx.    Pulmonary asthma , sleep apnea and Continuous Positive Airway Pressure Ventilation , Current Smoker,  breath sounds clear to auscultation  Pulmonary exam normal       Cardiovascular hypertension, Rhythm:Regular Rate:Normal     Neuro/Psych PSYCHIATRIC DISORDERS Anxiety negative neurological ROS     GI/Hepatic Neg liver ROS, GERD-  ,  Endo/Other  diabetes, Type 2, Oral Hypoglycemic Agents  Renal/GU negative Renal ROS     Musculoskeletal negative musculoskeletal ROS (+)   Abdominal   Peds  Hematology negative hematology ROS (+)   Anesthesia Other Findings   Reproductive/Obstetrics negative OB ROS                          Anesthesia Physical Anesthesia Plan  ASA: IV  Anesthesia Plan: MAC   Post-op Pain Management:    Induction:   Airway Management Planned:   Additional Equipment:   Intra-op Plan:   Post-operative Plan:   Informed Consent: I have reviewed the patients History and Physical, chart, labs and discussed the procedure including the risks, benefits and alternatives for the proposed anesthesia with the patient or authorized representative who has indicated his/her understanding and acceptance.   Dental advisory given  Plan Discussed with: CRNA  Anesthesia Plan Comments:         Anesthesia Quick Evaluation

## 2013-08-15 NOTE — Discharge Instructions (Signed)

## 2013-08-15 NOTE — Anesthesia Postprocedure Evaluation (Signed)
Anesthesia Post Note  Patient: Tara Keller  Procedure(s) Performed: Procedure(s) (LRB): COLONOSCOPY WITH PROPOFOL (N/A) ESOPHAGOGASTRODUODENOSCOPY (EGD) WITH PROPOFOL (N/A)  Anesthesia type: MAC  Patient location: PACU  Post pain: Pain level controlled  Post assessment: Post-op Vital signs reviewed  Last Vitals: BP 107/62  Pulse 82  Temp(Src) 36.6 C (Oral)  Resp 22  SpO2 100%  LMP 08/07/2013  Post vital signs: Reviewed  Level of consciousness: awake  Complications: No apparent anesthesia complications

## 2013-08-15 NOTE — H&P (View-Only) (Signed)
Patient ID: Tara Keller, female   DOB: [birth year], 48 y.o.   MRN: [id number] HPI: Tara Keller is a 48 year old female with a past medical history of gallstones status post cholecystectomy, hypertension, diabetes, obesity, sleep apnea not currently using CPAP, who is seen in consultation at the request of Dr. Annitta Keller to evaluate epigastric pain and recent imaging suggesting fatty liver with elevated liver enzymes. She is here alone today. She reports for the last several months she has had trouble with epigastric abdominal pain which radiates around each side and through to her back. This led to an ER visit in May and she was told she may have pancreatitis, fatty liver with elevated liver enzymes. Her pain is much worse when she eats fried foods or foods high in fat. She's had associated nausea, but rarely vomits. Her nausea and pain seems to be worse at night. She reports heartburn but this has been improved with pantoprazole 40 mg daily. She reports her bowel movements vary from constipated to loose. She has seen some black stool. She denies red blood or hematochezia. She reports a prior cholecystectomy performed in 2009. She denies weight loss in fact she has noted some weight gain. Family history notable for maternal grandfather with colon cancer, and breast cancer in her sister and maternal grandmother. She reports she is up-to-date on her breast cancer screening. Med list contains meloxicam but she states she is not currently taking this medicine. She denies a family history of liver disease. No history of jaundice, itching, known ascites or lower extremity edema  Past Medical History  Diagnosis Date  . Diabetes mellitus   . Asthma   . Hypertension   . Obesity   . Sleep apnea   . Arthritis   . H/O tubal ligation     Past Surgical History  Procedure Laterality Date  . Cholecystectomy    . Tubal ligation Bilateral     Outpatient Prescriptions Prior to Visit  Medication Sig Dispense Refill  .  albuterol (PROVENTIL HFA;VENTOLIN HFA) 108 (90 BASE) MCG/ACT inhaler Inhale 1-2 puffs into the lungs every 6 (six) hours as needed for wheezing or shortness of breath.  1 Inhaler  0  . cyclobenzaprine (FLEXERIL) 10 MG tablet Take 1 tablet (10 mg total) by mouth at bedtime.  30 tablet  1  . dicyclomine (BENTYL) 20 MG tablet Take 1 tablet (20 mg total) by mouth every 6 (six) hours as needed for spasms (for abdominal cramping).  20 tablet  0  . DULoxetine (CYMBALTA) 60 MG capsule Take 1 capsule (60 mg total) by mouth daily.  30 capsule  3  . fluticasone (FLONASE) 50 MCG/ACT nasal spray Place 2 sprays into both nostrils daily.  16 g  6  . glimepiride (AMARYL) 4 MG tablet Take 1 tablet (4 mg total) by mouth 2 (two) times daily.  60 tablet  3  . glucose blood (ACCU-CHEK ACTIVE STRIPS) test strip Use as instructed  100 each  12  . glucose monitoring kit (FREESTYLE) monitoring kit 1 each by Does not apply route as needed for other. Dispense any model that is covered- dispense testing supplies for Q AC/ HS accuchecks- 1 month supply with one refil.  1 each  1  . Lancets (ACCU-CHEK SOFT TOUCH) lancets Use as instructed  100 each  12  . Lancets (FREESTYLE) lancets Use as instructed  100 each  12  . meloxicam (MOBIC) 15 MG tablet Take 1 tablet (15 mg total) by mouth daily.  [place]  tablet  3  . metFORMIN (GLUCOPHAGE XR) 500 MG 24 hr tablet Take 2 tablets (1,000 mg total) by mouth 2 (two) times daily with a meal.  120 tablet  3  . Multiple Vitamins-Minerals (MULTIVITAMIN PO) Take by mouth.      . nicotine (NICODERM CQ - DOSED IN MG/24 HOURS) 14 mg/24hr patch Place 1 patch (14 mg total) onto the skin daily.  28 patch  0  . ondansetron (ZOFRAN ODT) 8 MG disintegrating tablet Take 1 tablet (8 mg total) by mouth every 8 (eight) hours as needed for nausea or vomiting.  20 tablet  0  . pantoprazole (PROTONIX) 40 MG tablet Take 1 tablet (40 mg total) by mouth daily.  30 tablet  3  . oxyCODONE (ROXICODONE) 5 MG immediate  release tablet Take 1 tablet (5 mg total) by mouth every 4 (four) hours as needed for severe pain.  20 tablet  0   No facility-administered medications prior to visit.    No Known Allergies  Family History  Problem Relation Age of Onset  . Diabetes Mother   . Stroke Mother   . Hypertension Mother   . Cancer Sister     History  Substance Use Topics  . Smoking status: Former Smoker -- 1.00 packs/day    Types: Cigarettes  . Smokeless tobacco: Never Used  . Alcohol Use: No    ROS: As per history of present illness, otherwise negative  BP 134/80  Pulse 76  Ht 5' 6" (1.676 m)  Wt 371 lb (168.284 kg)  BMI 59.91 kg/m2  SpO2 98% Constitutional: Well-developed and well-nourished. No distress. HEENT: Normocephalic and atraumatic. Oropharynx is clear and moist. No oropharyngeal exudate. Conjunctivae are normal.  No scleral icterus. Neck: Neck supple. Trachea midline. Cardiovascular: Normal rate, regular rhythm and intact distal pulses. No M/R/G Pulmonary/chest: Effort normal and breath sounds normal. No wheezing, rales or rhonchi. Abdominal: Soft, nontender, nondistended. Bowel sounds active throughout. There are no masses palpable. No hepatosplenomegaly. Extremities: no clubbing, cyanosis, or edema Lymphadenopathy: No cervical adenopathy noted. Neurological: Alert and oriented to person place and time. Skin: Skin is warm and dry. No rashes noted. Psychiatric: Normal mood and affect. Behavior is normal.  RELEVANT LABS AND IMAGING: CBC    Component Value Date/Time   WBC 10.6* 05/14/2013 2204   RBC 4.64 05/14/2013 2204   HGB 12.9 05/14/2013 2204   HCT 38.9 05/14/2013 2204   PLT 292 05/14/2013 2204   MCV 83.8 05/14/2013 2204   MCH 27.8 05/14/2013 2204   MCHC 33.2 05/14/2013 2204   RDW 13.9 05/14/2013 2204   LYMPHSABS 3.8 05/14/2013 2204   MONOABS 0.6 05/14/2013 2204   EOSABS 0.3 05/14/2013 2204   BASOSABS 0.0 05/14/2013 2204    CMP     Component Value Date/Time   NA 136* 05/14/2013 2204   K  3.5* 05/14/2013 2204   CL 97 05/14/2013 2204   CO2 30 05/14/2013 2204   GLUCOSE 254* 05/14/2013 2204   BUN 8 05/14/2013 2204   CREATININE 0.60 05/14/2013 2204   CREATININE 0.80 07/31/2012 1648   CALCIUM 9.5 05/14/2013 2204   PROT 8.2 05/14/2013 2204   ALBUMIN 3.1* 05/14/2013 2204   AST 39* 05/14/2013 2204   ALT 62* 05/14/2013 2204   ALKPHOS 207* 05/14/2013 2204   BILITOT <0.2* 05/14/2013 2204   GFRNONAA >90 05/14/2013 2204   GFRAA >90 05/14/2013 2204   Lab Results  Component Value Date   LIPASE 205* 05/14/2013   CT ABDOMEN AND PELVIS  WITH CONTRAST   TECHNIQUE: Multidetector CT imaging of the abdomen and pelvis was performed using the standard protocol following bolus administration of intravenous contrast.   CONTRAST:  181m OMNIPAQUE IOHEXOL 300 MG/ML  SOLN   COMPARISON:  06/25/2006   FINDINGS: BODY WALL: Unremarkable.   LOWER CHEST: Unremarkable.   ABDOMEN/PELVIS:   Liver: Severe fatty infiltration. A 9 mm hypervascular focus in the upper right liver is not seen on delayed imaging, and was not seen on non contrast imaging from 2008.   Biliary: Cholecystectomy.   Pancreas: Unremarkable.   Spleen: Unremarkable.   Adrenals: Unremarkable.   Kidneys and ureters: 1 cm cyst in the lower pole right kidney.   Bladder: Unremarkable.   Reproductive: Sub serosal fibroid from the uterine fundus, 3 cm in diameter. Intramural left body fibroid, 2.4 cm.   Bowel: No obstruction. Normal appendix.   Retroperitoneum: No mass or adenopathy.   Peritoneum: No free fluid or gas.   Vascular: No acute abnormality.   OSSEOUS: No acute abnormalities.   IMPRESSION: 1. No acute intra-abdominal findings. 2. Severe hepatic steatosis. 3. 9 mm hypervascular lesion in the upper right liver is likely a flash fill hemangioma, although strictly indeterminate on single phase examination. If there is chronic liver disease, follow-up liver MRI in 6 months is recommended. 4. Fibroid uterus.     Electronically  Signed   By: JJorje GuildM.D.   On: 05/15/2013 02:06  ASSESSMENT/PLAN: 48year old female with a past medical history of gallstones status post cholecystectomy, hypertension, diabetes, obesity, sleep apnea not currently using CPAP, who is seen in consultation at the request of Dr. AAnnitta Needsto evaluate epigastric pain and recent imaging suggesting fatty liver with elevated liver enzymes  1. Epigastric abd pain/nausea/elevated lipase -- her pain could be related to pancreatic inflammation though no true pancreatitis was seen by CT in May 2015. Her lipase was elevated approximately 4 times the upper limit of normal. I am concerned about the possibility of a retained gallstone given the concern of pancreatitis, her history of gallstones, and ongoing abdominal pain with elevated LFTs. I have recommended that she continue pantoprazole 40 mg daily. I will add Carafate before meals and at bedtime in case this is gastritis or duodenitis. I recommended cross-sectional imaging with open MRI of the abdomen and pelvis with contrast plus MRCP. This will better evaluate the pancreas, bile ducts, and the liver given the 9 mm hypervascular lesion seen by CT. After imaging, if unremarkable, I recommended upper endoscopy for direct visualization. We discussed the test including the risks and benefits and due to her BMI this will be performed with monitored anesthesia care in hospital setting  2.  Elevated liver enzymes/9 mm liver lesion -- elevated transaminases and alkaline phosphatase. Fatty liver is been diagnosed by CT, but we'll perform other liver test to rule out other causes of inflammation such as viral hepatitis, iron studies, ANA, AMA, IgG, ASMA.  Treatment of fatty liver his weight loss, control of blood sugars, diet and exercise. Further recommendation after imaging and blood tests  3.  Colorectal cancer screening/family history of colon cancer -- I recommended colonoscopy for colorectal cancer screening.  This will be performed in the hospital setting with monitored anesthesia care. The test was discussed including the risks and benefits and she is agreeable to proceed

## 2013-08-15 NOTE — Interval H&P Note (Signed)
History and Physical Interval Note: Pt presents for EGD/colon as discussed and recommended recently in clinic. No new complaints. The nature of the procedure, as well as the risks, benefits, and alternatives were carefully and thoroughly reviewed with the patient. Ample time for discussion and questions allowed. The patient understood, was satisfied, and agreed to proceed.     08/15/2013 9:06 AM  Tara Keller  has presented today for surgery, with the diagnosis of Family history of colon cancer requiring screening colonoscopy [V16.0] Abnormal finding on imaging [793.99]  The various methods of treatment have been discussed with the patient and family. After consideration of risks, benefits and other options for treatment, the patient has consented to  Procedure(s): COLONOSCOPY WITH PROPOFOL (N/A) ESOPHAGOGASTRODUODENOSCOPY (EGD) WITH PROPOFOL (N/A) as a surgical intervention .  The patient's history has been reviewed, patient examined, no change in status, stable for surgery.  I have reviewed the patient's chart and labs.  Questions were answered to the patient's satisfaction.     PYRTLE, JAY M

## 2013-08-15 NOTE — Op Note (Signed)
[facility] [place], [address]   ENDOSCOPY PROCEDURE REPORT  PATIENT: Tara, Keller  MR#: [id number] BIRTHDATE: 06-03-1965 , 48  yrs. old GENDER: Female ENDOSCOPIST: Jerene Bears, MD PROCEDURE DATE:  08/15/2013 PROCEDURE:  EGD w/ biopsy ASA CLASS:     Class III INDICATIONS:  Epigastric pain.   Nausea. MEDICATIONS: MAC sedation, administered by CRNA and See Anesthesia Report. TOPICAL ANESTHETIC: Cetacaine Spray  DESCRIPTION OF PROCEDURE: After the risks benefits and alternatives of the procedure were thoroughly explained, informed consent was obtained.  The Pentax Gastroscope [id number] endoscope was introduced through the mouth and advanced to the second portion of the duodenum. Without limitations.  The instrument was slowly withdrawn as the mucosa was fully examined.      ESOPHAGUS: There was evidence of possible Barrett's esophagus at the gastroesophageal junction (1 tongue 1 cm in length) .  A biopsy was performed using cold forceps.  Sample obtained to rule out Barrett's esophagus.   A 3-4 cm hiatal hernia was noted.  STOMACH: The mucosa of the stomach appeared normal.  Biopsies were taken in the body, antrum and angularis.  DUODENUM: The duodenal mucosa showed no abnormalities in the bulb and second portion of the duodenum.  Retroflexed views revealed a hiatal hernia.     The scope was then withdrawn from the patient and the procedure completed.  COMPLICATIONS: There were no complications.  ENDOSCOPIC IMPRESSION: 1.   There was evidence of possible Barrett's esophagus; biopsy 2.   3 cm hiatal hernia 3.   The mucosa of the stomach appeared normal; biopsies were taken  4.   The duodenal mucosa showed no abnormalities in the bulb and second portion of the duodenum  RECOMMENDATIONS: 1.  Await biopsy results 2.  Continue taking your PPI (pantoprazole 40 mg) once daily.  It is best to be taken 20-30 minutes prior to breakfast  meal.   eSigned:  Jerene Bears, MD 08/15/2013 10:10 AM   CC:The Patient; Dr. Annitta Needs, MD

## 2013-08-18 ENCOUNTER — Other Ambulatory Visit: Payer: Self-pay

## 2013-08-18 ENCOUNTER — Encounter (HOSPITAL_COMMUNITY): Payer: Self-pay | Admitting: Internal Medicine

## 2013-08-18 ENCOUNTER — Telehealth: Payer: Self-pay | Admitting: Internal Medicine

## 2013-08-18 DIAGNOSIS — K760 Fatty (change of) liver, not elsewhere classified: Secondary | ICD-10-CM

## 2013-08-18 NOTE — Telephone Encounter (Signed)
Pt scheduled for Hep A Vaccine 08/22/13@2pm . Pt aware of appt and orders entered.

## 2013-08-19 ENCOUNTER — Encounter: Payer: Self-pay | Admitting: Internal Medicine

## 2013-08-20 ENCOUNTER — Other Ambulatory Visit: Payer: Self-pay

## 2013-08-20 DIAGNOSIS — A048 Other specified bacterial intestinal infections: Secondary | ICD-10-CM

## 2013-08-20 MED ORDER — BIS SUBCIT-METRONID-TETRACYC 140-125-125 MG PO CAPS: 3.0000 | ORAL_CAPSULE | Freq: Three times a day (TID) | ORAL | Status: DC

## 2013-08-20 NOTE — Addendum Note (Signed)
Addended by: Marlon Pel on: 08/20/2013 09:13 AM   Modules accepted: Orders

## 2013-08-22 ENCOUNTER — Ambulatory Visit (INDEPENDENT_AMBULATORY_CARE_PROVIDER_SITE_OTHER): Payer: Medicaid Other | Admitting: Internal Medicine

## 2013-08-22 DIAGNOSIS — K7689 Other specified diseases of liver: Secondary | ICD-10-CM

## 2013-08-22 DIAGNOSIS — K760 Fatty (change of) liver, not elsewhere classified: Secondary | ICD-10-CM

## 2013-08-22 DIAGNOSIS — Z23 Encounter for immunization: Secondary | ICD-10-CM

## 2013-08-22 NOTE — Progress Notes (Signed)
Reminder sent to Rosanne Sack , RN  To set up 2nd Hep. A vaccine for Feb. 15th 2016.

## 2013-09-14 ENCOUNTER — Emergency Department (HOSPITAL_COMMUNITY)
Admission: EM | Admit: 2013-09-14 | Discharge: 2013-09-14 | Disposition: A | Discharge status: Home / Self Care | Payer: Medicaid Other | Attending: Emergency Medicine | Admitting: Emergency Medicine

## 2013-09-14 ENCOUNTER — Encounter (HOSPITAL_COMMUNITY): Payer: Self-pay | Admitting: Emergency Medicine

## 2013-09-14 DIAGNOSIS — G4730 Sleep apnea, unspecified: Secondary | ICD-10-CM | POA: Insufficient documentation

## 2013-09-14 DIAGNOSIS — Y929 Unspecified place or not applicable: Secondary | ICD-10-CM | POA: Insufficient documentation

## 2013-09-14 DIAGNOSIS — Y939 Activity, unspecified: Secondary | ICD-10-CM | POA: Insufficient documentation

## 2013-09-14 DIAGNOSIS — Z79899 Other long term (current) drug therapy: Secondary | ICD-10-CM | POA: Insufficient documentation

## 2013-09-14 DIAGNOSIS — J45909 Unspecified asthma, uncomplicated: Secondary | ICD-10-CM | POA: Insufficient documentation

## 2013-09-14 DIAGNOSIS — F3289 Other specified depressive episodes: Secondary | ICD-10-CM | POA: Diagnosis not present

## 2013-09-14 DIAGNOSIS — E119 Type 2 diabetes mellitus without complications: Secondary | ICD-10-CM | POA: Insufficient documentation

## 2013-09-14 DIAGNOSIS — Z9851 Tubal ligation status: Secondary | ICD-10-CM | POA: Diagnosis not present

## 2013-09-14 DIAGNOSIS — F329 Major depressive disorder, single episode, unspecified: Secondary | ICD-10-CM | POA: Diagnosis not present

## 2013-09-14 DIAGNOSIS — S6390XA Sprain of unspecified part of unspecified wrist and hand, initial encounter: Secondary | ICD-10-CM | POA: Diagnosis not present

## 2013-09-14 DIAGNOSIS — F17200 Nicotine dependence, unspecified, uncomplicated: Secondary | ICD-10-CM | POA: Insufficient documentation

## 2013-09-14 DIAGNOSIS — M129 Arthropathy, unspecified: Secondary | ICD-10-CM | POA: Insufficient documentation

## 2013-09-14 DIAGNOSIS — I10 Essential (primary) hypertension: Secondary | ICD-10-CM | POA: Insufficient documentation

## 2013-09-14 DIAGNOSIS — Z8781 Personal history of (healed) traumatic fracture: Secondary | ICD-10-CM | POA: Insufficient documentation

## 2013-09-14 DIAGNOSIS — X58XXXA Exposure to other specified factors, initial encounter: Secondary | ICD-10-CM | POA: Insufficient documentation

## 2013-09-14 DIAGNOSIS — S6990XA Unspecified injury of unspecified wrist, hand and finger(s), initial encounter: Secondary | ICD-10-CM | POA: Insufficient documentation

## 2013-09-14 DIAGNOSIS — S6391XA Sprain of unspecified part of right wrist and hand, initial encounter: Secondary | ICD-10-CM

## 2013-09-14 DIAGNOSIS — F411 Generalized anxiety disorder: Secondary | ICD-10-CM | POA: Diagnosis not present

## 2013-09-14 MED ORDER — NAPROXEN 500 MG PO TABS: 500.0000 mg | ORAL_TABLET | Freq: Two times a day (BID) | ORAL | Status: DC

## 2013-09-14 NOTE — ED Notes (Signed)
Declined W/C at D/C and was escorted to lobby by RN. 

## 2013-09-14 NOTE — Discharge Instructions (Signed)
Cryotherapy °Cryotherapy means treatment with cold. Ice or gel packs can be used to reduce both pain and swelling. Ice is the most helpful within the first 24 to 48 hours after an injury or flare-up from overusing a muscle or joint. Sprains, strains, spasms, burning pain, shooting pain, and aches can all be eased with ice. Ice can also be used when recovering from surgery. Ice is effective, has very few side effects, and is safe for most people to use. °PRECAUTIONS  °Ice is not a safe treatment option for people with: °· Raynaud phenomenon. This is a condition affecting small blood vessels in the extremities. Exposure to cold may cause your problems to return. °· Cold hypersensitivity. There are many forms of cold hypersensitivity, including: °¨ Cold urticaria. Red, itchy hives appear on the skin when the tissues begin to warm after being iced. °¨ Cold erythema. This is a red, itchy rash caused by exposure to cold. °¨ Cold hemoglobinuria. Red blood cells break down when the tissues begin to warm after being iced. The hemoglobin that carry oxygen are passed into the urine because they cannot combine with blood proteins fast enough. °· Numbness or altered sensitivity in the area being iced. °If you have any of the following conditions, do not use ice until you have discussed cryotherapy with your caregiver: °· Heart conditions, such as arrhythmia, angina, or chronic heart disease. °· High blood pressure. °· Healing wounds or open skin in the area being iced. °· Current infections. °· Rheumatoid arthritis. °· Poor circulation. °· Diabetes. °Ice slows the blood flow in the region it is applied. This is beneficial when trying to stop inflamed tissues from spreading irritating chemicals to surrounding tissues. However, if you expose your skin to cold temperatures for too long or without the proper protection, you can damage your skin or nerves. Watch for signs of skin damage due to cold. °HOME CARE INSTRUCTIONS °Follow  these tips to use ice and cold packs safely. °· Place a dry or damp towel between the ice and skin. A damp towel will cool the skin more quickly, so you may need to shorten the time that the ice is used. °· For a more rapid response, add gentle compression to the ice. °· Ice for no more than 10 to 20 minutes at a time. The bonier the area you are icing, the less time it will take to get the benefits of ice. °· Check your skin after 5 minutes to make sure there are no signs of a poor response to cold or skin damage. °· Rest 20 minutes or more between uses. °· Once your skin is numb, you can end your treatment. You can test numbness by very lightly touching your skin. The touch should be so light that you do not see the skin dimple from the pressure of your fingertip. When using ice, most people will feel these normal sensations in this order: cold, burning, aching, and numbness. °· Do not use ice on someone who cannot communicate their responses to pain, such as small children or people with dementia. °HOW TO MAKE AN ICE PACK °Ice packs are the most common way to use ice therapy. Other methods include ice massage, ice baths, and cryosprays. Muscle creams that cause a cold, tingly feeling do not offer the same benefits that ice offers and should not be used as a substitute unless recommended by your caregiver. °To make an ice pack, do one of the following: °· Place crushed ice or a   bag of frozen vegetables in a sealable plastic bag. Squeeze out the excess air. Place this bag inside another plastic bag. Slide the bag into a pillowcase or place a damp towel between your skin and the bag.  Mix 3 parts water with 1 part rubbing alcohol. Freeze the mixture in a sealable plastic bag. When you remove the mixture from the freezer, it will be slushy. Squeeze out the excess air. Place this bag inside another plastic bag. Slide the bag into a pillowcase or place a damp towel between your skin and the bag. SEEK MEDICAL CARE  IF:  You develop white spots on your skin. This may give the skin a blotchy (mottled) appearance.  Your skin turns blue or pale.  Your skin becomes waxy or hard.  Your swelling gets worse. MAKE SURE YOU:   Understand these instructions.  Will watch your condition.  Will get help right away if you are not doing well or get worse. Document Released: 08/22/2010 Document Revised: 05/12/2013 Document Reviewed: 08/22/2010 [facility] Patient Information 2015 [place], [place]. This information is not intended to replace advice given to you by your health care provider. Make sure you discuss any questions you have with your health care provider.  Finger Sprain A finger sprain is a tear in one of the strong, fibrous tissues that connect the bones (ligaments) in your finger. The severity of the sprain depends on how much of the ligament is torn. The tear can be either partial or complete. CAUSES  Often, sprains are a result of a fall or accident. If you extend your hands to catch an object or to protect yourself, the force of the impact causes the fibers of your ligament to stretch too much. This excess tension causes the fibers of your ligament to tear. SYMPTOMS  You may have some loss of motion in your finger. Other symptoms include:  Bruising.  Tenderness.  Swelling. DIAGNOSIS  In order to diagnose finger sprain, your caregiver will physically examine your finger or thumb to determine how torn the ligament is. Your caregiver may also suggest an X-ray exam of your finger to make sure no bones are broken. TREATMENT  If your ligament is only partially torn, treatment usually involves keeping the finger in a fixed position (immobilization) for a short period. To do this, your caregiver will apply a bandage, cast, or splint to keep your finger from moving until it heals. For a partially torn ligament, the healing process usually takes 2 to 3 weeks. If your ligament is completely torn, you may need  surgery to reconnect the ligament to the bone. After surgery a cast or splint will be applied and will need to stay on your finger or thumb for 4 to 6 weeks while your ligament heals. HOME CARE INSTRUCTIONS  Keep your injured finger elevated, when possible, to decrease swelling.  To ease pain and swelling, apply ice to your joint twice a day, for 2 to 3 days:  Put ice in a plastic bag.  Place a towel between your skin and the bag.  Leave the ice on for 15 minutes.  Only take over-the-counter or prescription medicine for pain as directed by your caregiver.  Do not wear rings on your injured finger.  Do not leave your finger unprotected until pain and stiffness go away (usually 3 to 4 weeks).  Do not allow your cast or splint to get wet. Cover your cast or splint with a plastic bag when you shower or bathe. Do  not swim.  Your caregiver may suggest special exercises for you to do during your recovery to prevent or limit permanent stiffness. SEEK IMMEDIATE MEDICAL CARE IF:  Your cast or splint becomes damaged.  Your pain becomes worse rather than better. MAKE SURE YOU:  Understand these instructions.  Will watch your condition.  Will get help right away if you are not doing well or get worse. Document Released: 02/03/2004 Document Revised: 03/20/2011 Document Reviewed: 08/29/2010 [facility] Patient Information 2015 [place], [place]. This information is not intended to replace advice given to you by your health care provider. Make sure you discuss any questions you have with your health care provider.

## 2013-09-14 NOTE — ED Notes (Signed)
Pt is here with left hand pain that started yesterday and pain encircles thumb area

## 2013-09-14 NOTE — ED Provider Notes (Signed)
CSN: [id number]     Arrival date & time 09/14/13  1411 History  This chart was scribed for non-physician practitioner, Charlann Lange, PA-C working with Hoy Morn, MD, by Erling Conte, ED Scribe. This patient was seen in room TR07C/TR07C and the patient's care was started at 3:10 PM.    Chief Complaint  Patient presents with  . Hand Pain      The history is provided by the patient. No language interpreter was used.    HPI Comments: Tara Keller is a 48 y.o. female with a h/o DM, asthma, HTN, obesity, arthritis, sleep apnea w/ CPAP, anxiety, and depression who presents to the Emergency Department complaining of constant, gradually worsening, moderate, "9/10", left hand pain onset 2 days ago. She states that when she woke up this morning it was the worse it has been. She notes the pain is localized mostly in the right thumb, She denies any injury or heavy lifting to strain the hand. She notes some associated swelling to right hand. She has not taken anything for the pain. She denies any skin discoloration or numbness. She is right handed.    Past Medical History  Diagnosis Date  . Diabetes mellitus   . Asthma   . Hypertension   . Obesity   . Arthritis   . H/O tubal ligation   . Sleep apnea     uses CPAP  . Anxiety   . Depression   . Fracture closed of upper end of forearm 9 years, Fx left left leg  . Fracture of left lower leg @ 48 years old   Past Surgical History  Procedure Laterality Date  . Cholecystectomy    . Tubal ligation Bilateral   . Colonoscopy with propofol N/A 08/15/2013    Procedure: COLONOSCOPY WITH PROPOFOL;  Surgeon: Jerene Bears, MD;  Location: [facility];  Service: Gastroenterology;  Laterality: N/A;  . Esophagogastroduodenoscopy (egd) with propofol N/A 08/15/2013    Procedure: ESOPHAGOGASTRODUODENOSCOPY (EGD) WITH PROPOFOL;  Surgeon: Jerene Bears, MD;  Location: [facility];  Service: Gastroenterology;  Laterality: N/A;   Family History  Problem  Relation Age of Onset  . Diabetes Mother   . Stroke Mother   . Hypertension Mother   . Cancer Sister    History  Substance Use Topics  . Smoking status: Current Every Day Smoker -- 0.25 packs/day    Types: Cigarettes  . Smokeless tobacco: Never Used  . Alcohol Use: No   OB History   Grav Para Term Preterm Abortions TAB SAB Ect Mult Living   3 3 2 1     1 4      Review of Systems  Musculoskeletal: Positive for arthralgias (right hand) and joint swelling (R hand).  Skin: Negative for color change.  Neurological: Negative for numbness.  All other systems reviewed and are negative.     Allergies  Review of patient's allergies indicates no known allergies.  Home Medications   Prior to Admission medications   Medication Sig Start Date End Date Taking? Authorizing Provider  albuterol (PROVENTIL HFA;VENTOLIN HFA) 108 (90 BASE) MCG/ACT inhaler Inhale 1 puff into the lungs every 6 (six) hours as needed for wheezing or shortness of breath.    Historical Provider, MD  bismuth-metronidazole-tetracycline [facility]) [id number] MG per capsule Take 3 capsules by mouth 4 (four) times daily -  before meals and at bedtime. 08/20/13   Jerene Bears, MD  buPROPion (WELLBUTRIN XL) 150 MG 24 hr tablet Take 450 mg by mouth  every morning.    Historical Provider, MD  clonazePAM (KLONOPIN) 2 MG tablet Take 2 mg by mouth at bedtime.    Historical Provider, MD  cyclobenzaprine (FLEXERIL) 10 MG tablet Take 10 mg by mouth at bedtime.    Historical Provider, MD  DULoxetine (CYMBALTA) 60 MG capsule Take 60 mg by mouth every morning.    Historical Provider, MD  glimepiride (AMARYL) 4 MG tablet Take 4 mg by mouth daily with breakfast.    Historical Provider, MD  metFORMIN (GLUCOPHAGE) 500 MG tablet Take 500 mg by mouth 2 (two) times daily with a meal.    Historical Provider, MD  pantoprazole (PROTONIX) 40 MG tablet Take 40 mg by mouth daily.    Historical Provider, MD   Triage Vitals: BP 136/83  Pulse 101   Temp(Src) 97.9 F (36.6 C) (Oral)  Resp 22  SpO2 98%  Physical Exam  Nursing note and vitals reviewed. Constitutional: She is oriented to person, place, and time. She appears well-developed and well-nourished. No distress.  HENT:  Head: Normocephalic and atraumatic.  Eyes: Conjunctivae and EOM are normal.  Neck: Neck supple. No tracheal deviation present.  Cardiovascular: Normal rate.   Pulmonary/Chest: Effort normal. No respiratory distress.  Musculoskeletal: Normal range of motion.  Right hand: mildly swollen on thenar prominence. Tenderness limitedto this area. No discoloration. No dorsal tenderness. Pain with flexion but full range of motion.  Neurological: She is alert and oriented to person, place, and time.  Skin: Skin is warm and dry.  Psychiatric: She has a normal mood and affect. Her behavior is normal.    ED Course  Procedures (including critical care time)  DIAGNOSTIC STUDIES: Oxygen Saturation is 98% on RA, normal by my interpretation.    COORDINATION OF CARE:    Labs Review Labs Reviewed - No data to display  Imaging Review No results found.   EKG Interpretation None      MDM   Final diagnoses:  None    1. Hand sprain, left  No known mechanism of injury in sprain presentation. Splint provided.  I personally performed the services described in this documentation, which was scribed in my presence. The recorded information has been reviewed and is accurate.     Dewaine Oats, PA-C 09/14/13 1601

## 2013-09-16 ENCOUNTER — Other Ambulatory Visit: Payer: Self-pay | Admitting: Internal Medicine

## 2013-09-16 NOTE — Telephone Encounter (Signed)
Pt. Called to request a refill on metFORMIN (GLUCOPHAGE) 500 MG tablet. Pt would like to know if she can just get a refill or does she need a Dr.'s appt. Please f/u with pt.

## 2013-09-17 NOTE — Telephone Encounter (Signed)
Pt advised to schedule apt with PCP.  Rx Glucophage 500 was refill was e-scribe

## 2013-09-17 NOTE — ED Provider Notes (Signed)
Medical screening examination/treatment/procedure(s) were performed by non-physician practitioner and as supervising physician I was immediately available for consultation/collaboration.   EKG Interpretation None        Hoy Morn, MD 09/17/13 [phone]

## 2013-09-18 MED ORDER — METFORMIN HCL 500 MG PO TABS: 500.0000 mg | ORAL_TABLET | Freq: Two times a day (BID) | ORAL | Status: DC

## 2013-09-18 NOTE — Telephone Encounter (Signed)
Pt calling regarding metformin refill, says she is completely out at this point and has not heard back regarding refills. Please f/u with pt, she uses [facility] on [address].

## 2013-09-18 NOTE — Telephone Encounter (Signed)
Spoke with patient and informed her that refill was sent in until she can get in with PCP

## 2013-09-22 ENCOUNTER — Other Ambulatory Visit: Payer: Self-pay

## 2013-09-23 ENCOUNTER — Telehealth: Payer: Self-pay | Admitting: *Deleted

## 2013-09-23 NOTE — Telephone Encounter (Signed)
Left message on patient's VM to return call only if still has not received metformin.  Rx for metformin e-scribed to [facility] on [address] on 09/17/13.

## 2013-09-30 ENCOUNTER — Encounter: Payer: Self-pay | Admitting: Internal Medicine

## 2013-09-30 ENCOUNTER — Ambulatory Visit: Payer: Medicaid Other | Attending: Internal Medicine | Admitting: Internal Medicine

## 2013-09-30 VITALS — BP 112/75 | HR 91 | Temp 98.2°F | Resp 17 | Wt 368.6 lb

## 2013-09-30 DIAGNOSIS — J45909 Unspecified asthma, uncomplicated: Secondary | ICD-10-CM | POA: Insufficient documentation

## 2013-09-30 DIAGNOSIS — Z113 Encounter for screening for infections with a predominantly sexual mode of transmission: Secondary | ICD-10-CM | POA: Insufficient documentation

## 2013-09-30 DIAGNOSIS — F411 Generalized anxiety disorder: Secondary | ICD-10-CM | POA: Insufficient documentation

## 2013-09-30 DIAGNOSIS — Z79899 Other long term (current) drug therapy: Secondary | ICD-10-CM | POA: Diagnosis not present

## 2013-09-30 DIAGNOSIS — G4733 Obstructive sleep apnea (adult) (pediatric): Secondary | ICD-10-CM | POA: Insufficient documentation

## 2013-09-30 DIAGNOSIS — F17200 Nicotine dependence, unspecified, uncomplicated: Secondary | ICD-10-CM | POA: Diagnosis not present

## 2013-09-30 DIAGNOSIS — Z791 Long term (current) use of non-steroidal anti-inflammatories (NSAID): Secondary | ICD-10-CM | POA: Diagnosis not present

## 2013-09-30 DIAGNOSIS — E1165 Type 2 diabetes mellitus with hyperglycemia: Principal | ICD-10-CM

## 2013-09-30 DIAGNOSIS — Z9989 Dependence on other enabling machines and devices: Secondary | ICD-10-CM | POA: Insufficient documentation

## 2013-09-30 DIAGNOSIS — F3289 Other specified depressive episodes: Secondary | ICD-10-CM | POA: Diagnosis not present

## 2013-09-30 DIAGNOSIS — Z9089 Acquired absence of other organs: Secondary | ICD-10-CM | POA: Insufficient documentation

## 2013-09-30 DIAGNOSIS — IMO0001 Reserved for inherently not codable concepts without codable children: Secondary | ICD-10-CM | POA: Insufficient documentation

## 2013-09-30 DIAGNOSIS — E089 Diabetes mellitus due to underlying condition without complications: Secondary | ICD-10-CM

## 2013-09-30 DIAGNOSIS — F329 Major depressive disorder, single episode, unspecified: Secondary | ICD-10-CM | POA: Diagnosis not present

## 2013-09-30 DIAGNOSIS — E139 Other specified diabetes mellitus without complications: Secondary | ICD-10-CM

## 2013-09-30 LAB — GLUCOSE, POCT (MANUAL RESULT ENTRY): POC GLUCOSE: 363 mg/dL — AB (ref 70–99)

## 2013-09-30 LAB — POCT GLYCOSYLATED HEMOGLOBIN (HGB A1C): HEMOGLOBIN A1C: 10.1

## 2013-09-30 MED ORDER — METFORMIN HCL 1000 MG PO TABS: 1000.0000 mg | ORAL_TABLET | Freq: Two times a day (BID) | ORAL | Status: DC

## 2013-09-30 MED ORDER — NICOTINE 21 MG/24HR TD PT24: 21.0000 mg | MEDICATED_PATCH | Freq: Every day | TRANSDERMAL | Status: DC

## 2013-09-30 MED ORDER — METFORMIN HCL 500 MG PO TABS: 1000.0000 mg | ORAL_TABLET | Freq: Two times a day (BID) | ORAL | Status: DC

## 2013-09-30 MED ORDER — INSULIN ASPART 100 UNIT/ML ~~LOC~~ SOLN
20.0000 [IU] | Freq: Once | SUBCUTANEOUS | Status: AC
  Administered 2013-09-30: 20 [IU] via SUBCUTANEOUS

## 2013-09-30 MED ORDER — PHENTERMINE HCL 15 MG PO CAPS: 15.0000 mg | ORAL_CAPSULE | ORAL | Status: DC

## 2013-09-30 NOTE — Patient Instructions (Signed)
Diabetes Mellitus and Food It is important for you to manage your blood sugar (glucose) level. Your blood glucose level can be greatly affected by what you eat. Eating healthier foods in the appropriate amounts throughout the day at about the same time each day will help you control your blood glucose level. It can also help slow or prevent worsening of your diabetes mellitus. Healthy eating may even help you improve the level of your blood pressure and reach or maintain a healthy weight.  HOW CAN FOOD AFFECT ME? Carbohydrates Carbohydrates affect your blood glucose level more than any other type of food. Your dietitian will help you determine how many carbohydrates to eat at each meal and teach you how to count carbohydrates. Counting carbohydrates is important to keep your blood glucose at a healthy level, especially if you are using insulin or taking certain medicines for diabetes mellitus. Alcohol Alcohol can cause sudden decreases in blood glucose (hypoglycemia), especially if you use insulin or take certain medicines for diabetes mellitus. Hypoglycemia can be a life-threatening condition. Symptoms of hypoglycemia (sleepiness, dizziness, and disorientation) are similar to symptoms of having too much alcohol.  If your health care provider has given you approval to drink alcohol, do so in moderation and use the following guidelines:  Women should not have more than one drink per day, and men should not have more than two drinks per day. One drink is equal to:  12 oz of beer.  5 oz of wine.  1 oz of hard liquor.  Do not drink on an empty stomach.  Keep yourself hydrated. Have water, diet soda, or unsweetened iced tea.  Regular soda, juice, and other mixers might contain a lot of carbohydrates and should be counted. WHAT FOODS ARE NOT RECOMMENDED? As you make food choices, it is important to remember that all foods are not the same. Some foods have fewer nutrients per serving than other  foods, even though they might have the same number of calories or carbohydrates. It is difficult to get your body what it needs when you eat foods with fewer nutrients. Examples of foods that you should avoid that are high in calories and carbohydrates but low in nutrients include:  Trans fats (most processed foods list trans fats on the Nutrition Facts label).  Regular soda.  Juice.  Candy.  Sweets, such as cake, pie, doughnuts, and cookies.  Fried foods. WHAT FOODS CAN I EAT? Have nutrient-rich foods, which will nourish your body and keep you healthy. The food you should eat also will depend on several factors, including:  The calories you need.  The medicines you take.  Your weight.  Your blood glucose level.  Your blood pressure level.  Your cholesterol level. You also should eat a variety of foods, including:  Protein, such as meat, poultry, fish, tofu, nuts, and seeds (lean animal proteins are best).  Fruits.  Vegetables.  Dairy products, such as milk, cheese, and yogurt (low fat is best).  Breads, grains, pasta, cereal, rice, and beans.  Fats such as olive oil, trans fat-free margarine, canola oil, avocado, and olives. DOES EVERYONE WITH DIABETES MELLITUS HAVE THE SAME MEAL PLAN? Because every person with diabetes mellitus is different, there is not one meal plan that works for everyone. It is very important that you meet with a dietitian who will help you create a meal plan that is just right for you. Document Released: 09/22/2004 Document Revised: 12/31/2012 Document Reviewed: 11/22/2012 [employer] Patient Information 2015 [employer]. This   information is not intended to replace advice given to you by your health care provider. Make sure you discuss any questions you have with your health care provider.  

## 2013-09-30 NOTE — Progress Notes (Signed)
Patient here for follow up on her DM Would like to be tested for aids and STD's

## 2013-09-30 NOTE — Progress Notes (Signed)
MRN: [id number] Name: Tara Keller  Sex: female Age: 48 y.o. DOB: 30-Mar-1965  Allergies: Review of patient's allergies indicates no known allergies.  Chief Complaint  Patient presents with  . Follow-up    HPI: Patient is 48 y.o. female who has to of diabetes hypertension morbid obesity comes today for followup, as per patient she is only taking metformin 500 mg twice a day as well as Amaryl 4 mg twice a day, on the last visit she was advised to increase the dose to thousand milligram twice a day, she denies any hypoglycemic symptoms today her A1c noticed to be trending up, patient also reported that she tried phentermine which helped her with losing weight her and is requesting the prescription, she denies any history of heart disease, denies any chest and shortness of breath. Patient wants to be checked for STDs.  Past Medical History  Diagnosis Date  . Diabetes mellitus   . Asthma   . Hypertension   . Obesity   . Arthritis   . H/O tubal ligation   . Sleep apnea     uses CPAP  . Anxiety   . Depression   . Fracture closed of upper end of forearm 9 years, Fx left left leg  . Fracture of left lower leg @ 48 years old    Past Surgical History  Procedure Laterality Date  . Cholecystectomy    . Tubal ligation Bilateral   . Colonoscopy with propofol N/A 08/15/2013    Procedure: COLONOSCOPY WITH PROPOFOL;  Surgeon: Jerene Bears, MD;  Location: [facility];  Service: Gastroenterology;  Laterality: N/A;  . Esophagogastroduodenoscopy (egd) with propofol N/A 08/15/2013    Procedure: ESOPHAGOGASTRODUODENOSCOPY (EGD) WITH PROPOFOL;  Surgeon: Jerene Bears, MD;  Location: [facility];  Service: Gastroenterology;  Laterality: N/A;      Medication List       This list is accurate as of: 09/30/13  3:21 PM.  Always use your most recent med list.               albuterol 108 (90 BASE) MCG/ACT inhaler  Commonly known as:  PROVENTIL HFA;VENTOLIN HFA  Inhale 1 puff into the lungs every 6  (six) hours as needed for wheezing or shortness of breath.     bismuth-metronidazole-tetracycline 140-125-125 MG per capsule  Commonly known as:  PYLERA  Take 3 capsules by mouth 4 (four) times daily -  before meals and at bedtime.     buPROPion 150 MG 24 hr tablet  Commonly known as:  WELLBUTRIN XL  Take 450 mg by mouth every morning.     clonazePAM 2 MG tablet  Commonly known as:  KLONOPIN  Take 2 mg by mouth at bedtime.     cyclobenzaprine 10 MG tablet  Commonly known as:  FLEXERIL  Take 10 mg by mouth at bedtime.     DULoxetine 60 MG capsule  Commonly known as:  CYMBALTA  Take 60 mg by mouth every morning.     glimepiride 4 MG tablet  Commonly known as:  AMARYL  Take 4 mg by mouth daily with breakfast.     metFORMIN 500 MG tablet  Commonly known as:  GLUCOPHAGE  Take 2 tablets (1,000 mg total) by mouth 2 (two) times daily with a meal.     naproxen 500 MG tablet  Commonly known as:  NAPROSYN  Take 1 tablet (500 mg total) by mouth 2 (two) times daily.     nicotine 21 mg/24hr patch  Commonly known as:  NICODERM CQ  Place 1 patch (21 mg total) onto the skin daily.     pantoprazole 40 MG tablet  Commonly known as:  PROTONIX  Take 40 mg by mouth daily.     phentermine 15 MG capsule  Take 1 capsule (15 mg total) by mouth every morning.        Meds ordered this encounter  Medications  . insulin aspart (novoLOG) injection 20 Units    Sig:   . metFORMIN (GLUCOPHAGE) 500 MG tablet    Sig: Take 2 tablets (1,000 mg total) by mouth 2 (two) times daily with a meal.    Dispense:  60 tablet    Refill:  3    Pt needs apointment with PCP  . nicotine (NICODERM CQ) 21 mg/24hr patch    Sig: Place 1 patch (21 mg total) onto the skin daily.    Dispense:  28 patch    Refill:  0  . phentermine 15 MG capsule    Sig: Take 1 capsule (15 mg total) by mouth every morning.    Dispense:  30 capsule    Refill:  1    Immunization History  Administered Date(s) Administered  .  Hepatitis A, Adult 08/22/2013    Family History  Problem Relation Age of Onset  . Diabetes Mother   . Stroke Mother   . Hypertension Mother   . Cancer Sister     History  Substance Use Topics  . Smoking status: Current Every Day Smoker -- 0.25 packs/day    Types: Cigarettes  . Smokeless tobacco: Never Used  . Alcohol Use: No    Review of Systems   As noted in HPI  Filed Vitals:   09/30/13 1442  BP: 112/75  Pulse: 91  Temp: 98.2 F (36.8 C)  Resp: 17    Physical Exam  Physical Exam  Constitutional: No distress.  Eyes: EOM are normal. Pupils are equal, round, and reactive to light.  Cardiovascular: Normal rate and regular rhythm.   Pulmonary/Chest: Breath sounds normal. No respiratory distress. She has no wheezes. She has no rales.    CBC    Component Value Date/Time   WBC 9.8 07/21/2013 1418   RBC 4.85 07/21/2013 1418   HGB 12.7 07/21/2013 1418   HCT 40.3 07/21/2013 1418   PLT 327.0 07/21/2013 1418   MCV 83.2 07/21/2013 1418   LYMPHSABS 3.3 07/21/2013 1418   MONOABS 0.5 07/21/2013 1418   EOSABS 0.4 07/21/2013 1418   BASOSABS 0.1 07/21/2013 1418    CMP     Component Value Date/Time   NA 137 07/21/2013 1418   K 3.4* 07/21/2013 1418   CL 100 07/21/2013 1418   CO2 29 07/21/2013 1418   GLUCOSE 159* 07/21/2013 1418   BUN 6 07/21/2013 1418   CREATININE 0.6 07/21/2013 1418   CREATININE 0.80 07/31/2012 1648   CALCIUM 9.4 07/21/2013 1418   PROT 8.0 07/21/2013 1418   ALBUMIN 3.5 07/21/2013 1418   AST 48* 07/21/2013 1418   ALT 72* 07/21/2013 1418   ALKPHOS 172* 07/21/2013 1418   BILITOT 0.3 07/21/2013 1418   GFRNONAA >90 05/14/2013 2204   GFRAA >90 05/14/2013 2204    Lab Results  Component Value Date/Time   CHOL 127 08/15/2012 10:58 AM    No components found with this basename: hga1c    Lab Results  Component Value Date/Time   AST 48* 07/21/2013  2:18 PM    Assessment and Plan  Diabetes mellitus due to  underlying condition without complications - Plan:  Results for  orders placed in visit on 09/30/13  GLUCOSE, POCT (MANUAL RESULT ENTRY)      Result Value Ref Range   POC Glucose 363 (*) 70 - 99 mg/dl  POCT GLYCOSYLATED HEMOGLOBIN (HGB A1C)      Result Value Ref Range   Hemoglobin A1C 10.1     Diabetes is uncontrolled, have increased the dose of metformin thousand milligram twice a day also advise for diabetes meal planning she'll continue with Amaryl, she was given insulin aspart (novoLOG) injection 20 Units, discussed about starting patient on insulin patient is reluctant will think about it, her  Screen for STD (sexually transmitted disease) - Plan: GC/chlamydia probe amp, urine, HIV antibody (with reflex)  Morbid obesity - Plan: Patient is given prescription for phentermine 15 MG capsule, I have already discussed with patient this medication his only for short-term, she understands.  Smoking - Plan: Advised patient to quit smoking nicotine (NICODERM CQ) 21 mg/24hr patch   Return in about 3 months (around 12/30/2013) for diabetes.  Lorayne Marek, MD

## 2013-10-01 LAB — HIV ANTIBODY (ROUTINE TESTING W REFLEX): HIV 1&2 Ab, 4th Generation: NONREACTIVE

## 2013-10-01 LAB — GC/CHLAMYDIA PROBE AMP, URINE
Chlamydia, Swab/Urine, PCR: NEGATIVE
GC PROBE AMP, URINE: NEGATIVE

## 2013-10-02 ENCOUNTER — Telehealth: Payer: Self-pay | Admitting: *Deleted

## 2013-10-02 ENCOUNTER — Telehealth: Payer: Self-pay | Admitting: Internal Medicine

## 2013-10-02 NOTE — Telephone Encounter (Signed)
Left voice message, stating lab work negative

## 2013-10-02 NOTE — Telephone Encounter (Signed)
Message copied by Betti Cruz on Thu Oct 02, 2013 12:54 PM ------      Message from: Lorayne Marek      Created: Wed Oct 01, 2013 11:18 AM       Call and let the patient know that her std test is negative. ------

## 2013-10-02 NOTE — Telephone Encounter (Signed)
Patient would like to know her Lab test results. Please f/u with Patient.

## 2013-10-08 ENCOUNTER — Telehealth: Payer: Self-pay | Admitting: Internal Medicine

## 2013-10-08 NOTE — Telephone Encounter (Signed)
Pt. Called stating that her face and neck started to breakout, pt. States that it is itchy. Pt. Is unsure if it is because of a medication she is taking and would like to speak to a nurse. Please f/u with pt.

## 2013-10-11 ENCOUNTER — Emergency Department (HOSPITAL_COMMUNITY)
Admission: EM | Admit: 2013-10-11 | Discharge: 2013-10-11 | Disposition: A | Discharge status: Home / Self Care | Payer: Medicaid Other | Attending: Emergency Medicine | Admitting: Emergency Medicine

## 2013-10-11 ENCOUNTER — Encounter (HOSPITAL_COMMUNITY): Payer: Self-pay | Admitting: Emergency Medicine

## 2013-10-11 DIAGNOSIS — E119 Type 2 diabetes mellitus without complications: Secondary | ICD-10-CM | POA: Diagnosis not present

## 2013-10-11 DIAGNOSIS — F419 Anxiety disorder, unspecified: Secondary | ICD-10-CM | POA: Diagnosis not present

## 2013-10-11 DIAGNOSIS — M1990 Unspecified osteoarthritis, unspecified site: Secondary | ICD-10-CM | POA: Insufficient documentation

## 2013-10-11 DIAGNOSIS — Z8781 Personal history of (healed) traumatic fracture: Secondary | ICD-10-CM | POA: Diagnosis not present

## 2013-10-11 DIAGNOSIS — E669 Obesity, unspecified: Secondary | ICD-10-CM | POA: Diagnosis not present

## 2013-10-11 DIAGNOSIS — Z791 Long term (current) use of non-steroidal anti-inflammatories (NSAID): Secondary | ICD-10-CM | POA: Diagnosis not present

## 2013-10-11 DIAGNOSIS — Z9981 Dependence on supplemental oxygen: Secondary | ICD-10-CM | POA: Insufficient documentation

## 2013-10-11 DIAGNOSIS — J45909 Unspecified asthma, uncomplicated: Secondary | ICD-10-CM | POA: Diagnosis not present

## 2013-10-11 DIAGNOSIS — I10 Essential (primary) hypertension: Secondary | ICD-10-CM | POA: Diagnosis not present

## 2013-10-11 DIAGNOSIS — Z720 Tobacco use: Secondary | ICD-10-CM | POA: Diagnosis not present

## 2013-10-11 DIAGNOSIS — F329 Major depressive disorder, single episode, unspecified: Secondary | ICD-10-CM | POA: Diagnosis not present

## 2013-10-11 DIAGNOSIS — Z79899 Other long term (current) drug therapy: Secondary | ICD-10-CM | POA: Diagnosis not present

## 2013-10-11 DIAGNOSIS — G4730 Sleep apnea, unspecified: Secondary | ICD-10-CM | POA: Insufficient documentation

## 2013-10-11 DIAGNOSIS — L0291 Cutaneous abscess, unspecified: Secondary | ICD-10-CM

## 2013-10-11 DIAGNOSIS — L02416 Cutaneous abscess of left lower limb: Secondary | ICD-10-CM | POA: Diagnosis present

## 2013-10-11 MED ORDER — HYDROCODONE-ACETAMINOPHEN 5-325 MG PO TABS: 1.0000 | ORAL_TABLET | Freq: Four times a day (QID) | ORAL | Status: DC | PRN

## 2013-10-11 MED ORDER — CEPHALEXIN 500 MG PO CAPS: ORAL_CAPSULE | ORAL | Status: DC

## 2013-10-11 MED ORDER — NAPROXEN 500 MG PO TABS: 500.0000 mg | ORAL_TABLET | Freq: Two times a day (BID) | ORAL | Status: DC | PRN

## 2013-10-11 MED ORDER — SULFAMETHOXAZOLE-TMP DS 800-160 MG PO TABS: 1.0000 | ORAL_TABLET | Freq: Two times a day (BID) | ORAL | Status: DC

## 2013-10-11 NOTE — ED Provider Notes (Signed)
CSN: [id number]     Arrival date & time 10/11/13  1134 History  This chart was scribed for [employer], PA,  working with Mariea Clonts, MD found by Starleen Arms, ED Scribe. This patient was seen in room TR11C/TR11C and the patient's care was started at 11:48 AM.   Chief Complaint  Patient presents with  . Abscess   Patient is a 48 y.o. female presenting with abscess. The history is provided by the patient. No language interpreter was used.  Abscess Location:  Leg Leg abscess location:  L upper leg Abscess quality: draining, induration and painful   Abscess quality: no redness and no warmth   Red streaking: no   Duration:  1 week Progression:  Improving Pain details:    Quality:  Throbbing   Severity:  Severe (10/10)   Duration:  7 days   Timing:  Constant   Progression:  Unchanged Chronicity:  New Context: diabetes   Relieved by:  Draining/squeezing Exacerbated by: pressure/palpation. Ineffective treatments:  None tried Associated symptoms: no fever, no nausea and no vomiting     HPI Comments: Tara Keller is a 48 y.o. female with a history of DM who presents to the Emergency Department complaining of a gradually worsening area of swelling and pain on her left posterior thigh onset 1 week ago.  Patient reports her fiance squeezed the affected area several days ago and brown drainage was observed.  Patient reports she put an ointment on the area and bandaged it.  Patient reports pain in the area has continued to drain but the pain began to worsen several days ago and currently rates it a 10/10 and describes it as throbbing, constant, nonradiating.  Patient states that pressure to the area worsens the pain.  She reports continuing drainage, yellowish.  Patient denies fever, chills, abd pain, n/v/d, or other symptoms.  NKDA.   Past Medical History  Diagnosis Date  . Diabetes mellitus   . Asthma   . Hypertension   . Obesity   . Arthritis   . H/O tubal ligation   .  Sleep apnea     uses CPAP  . Anxiety   . Depression   . Fracture closed of upper end of forearm 9 years, Fx left left leg  . Fracture of left lower leg @ 48 years old   Past Surgical History  Procedure Laterality Date  . Cholecystectomy    . Tubal ligation Bilateral   . Colonoscopy with propofol N/A 08/15/2013    Procedure: COLONOSCOPY WITH PROPOFOL;  Surgeon: Jerene Bears, MD;  Location: [facility];  Service: Gastroenterology;  Laterality: N/A;  . Esophagogastroduodenoscopy (egd) with propofol N/A 08/15/2013    Procedure: ESOPHAGOGASTRODUODENOSCOPY (EGD) WITH PROPOFOL;  Surgeon: Jerene Bears, MD;  Location: [facility];  Service: Gastroenterology;  Laterality: N/A;   Family History  Problem Relation Age of Onset  . Diabetes Mother   . Stroke Mother   . Hypertension Mother   . Cancer Sister    History  Substance Use Topics  . Smoking status: Current Every Day Smoker -- 0.25 packs/day    Types: Cigarettes  . Smokeless tobacco: Never Used  . Alcohol Use: No   OB History   Grav Para Term Preterm Abortions TAB SAB Ect Mult Living   3 3 2 1     1 4      Review of Systems  Constitutional: Negative for fever and chills.  Gastrointestinal: Negative for nausea, vomiting and abdominal pain.  Musculoskeletal: Negative for arthralgias and myalgias.  Skin: Positive for wound.  Allergic/Immunologic: Positive for immunocompromised state (diabetes).   10 Systems reviewed and all are negative for acute change except as noted in the HPI.   Allergies  Review of patient's allergies indicates no known allergies.  Home Medications   Prior to Admission medications   Medication Sig Start Date End Date Taking? Authorizing Provider  albuterol (PROVENTIL HFA;VENTOLIN HFA) 108 (90 BASE) MCG/ACT inhaler Inhale 1 puff into the lungs every 6 (six) hours as needed for wheezing or shortness of breath.    Historical Provider, MD  bismuth-metronidazole-tetracycline [facility]) [phone] MG per capsule  Take 3 capsules by mouth 4 (four) times daily -  before meals and at bedtime. 08/20/13   Jerene Bears, MD  buPROPion (WELLBUTRIN XL) 150 MG 24 hr tablet Take 450 mg by mouth every morning.    Historical Provider, MD  clonazePAM (KLONOPIN) 2 MG tablet Take 2 mg by mouth at bedtime.    Historical Provider, MD  cyclobenzaprine (FLEXERIL) 10 MG tablet Take 10 mg by mouth at bedtime.    Historical Provider, MD  DULoxetine (CYMBALTA) 60 MG capsule Take 60 mg by mouth every morning.    Historical Provider, MD  glimepiride (AMARYL) 4 MG tablet Take 4 mg by mouth daily with breakfast.    Historical Provider, MD  metFORMIN (GLUCOPHAGE) 1000 MG tablet Take 1 tablet (1,000 mg total) by mouth 2 (two) times daily with a meal. 09/30/13   Lorayne Marek, MD  naproxen (NAPROSYN) 500 MG tablet Take 1 tablet (500 mg total) by mouth 2 (two) times daily. 09/14/13   Shari A Upstill, PA-C  nicotine (NICODERM CQ) 21 mg/24hr patch Place 1 patch (21 mg total) onto the skin daily. 09/30/13   Lorayne Marek, MD  pantoprazole (PROTONIX) 40 MG tablet Take 40 mg by mouth daily.    Historical Provider, MD  phentermine 15 MG capsule Take 1 capsule (15 mg total) by mouth every morning. 09/30/13   Lorayne Marek, MD   BP 152/82  Pulse 100  Temp(Src) 98 F (36.7 C) (Oral)  Resp 18  Ht 5\' 6"  (1.676 m)  Wt 322 lb (146.058 kg)  BMI 52.00 kg/m2  SpO2 99% Physical Exam  Nursing note and vitals reviewed. Constitutional: She is oriented to person, place, and time. Vital signs are normal. She appears well-developed and well-nourished. No distress.  Afebrile, nontoxic, obese AAF  HENT:  Head: Normocephalic and atraumatic.  Eyes: Conjunctivae and EOM are normal.  Neck: Normal range of motion. Neck supple.  Cardiovascular: Normal rate.   Pulmonary/Chest: Effort normal. No respiratory distress.  Abdominal: Normal appearance.  obese  Musculoskeletal: Normal range of motion.  MAE x4  Neurological: She is alert and oriented to person,  place, and time.  Skin: Skin is warm and dry. Lesion noted. No erythema.  Small, 2cm area of induration, with central drainage of yellowish material, actively draining, no warmth or erythema, no fluctuance. No surrounding cellulitis or red streaking  Psychiatric: She has a normal mood and affect. Her behavior is normal.    ED Course  Procedures (including critical care time)  DIAGNOSTIC STUDIES: Oxygen Saturation is 99% on RA, normal by my interpretation.    COORDINATION OF CARE:  12:22 PM Advised patient to keep area clean and dry as well as to use a warm compress.  Will prescribe oral antibiotic.  Will order pain medication.  Advised patient of return precautions.  Patient acknowledges and agrees with plan.  Labs Review Labs Reviewed - No data to display  Imaging Review No results found.   EKG Interpretation None      MDM   Final diagnoses:  Abscess    48y/o female with DM with abscess, draining on it's own, no cellulitis or fluctuance. No need for I&D today. Afebrile and nontoxic. Given hx of DM, will give abx to treat remaining infection. Discussed wound care, and will have her f/up at PCP for wound recheck in 2 days. I explained the diagnosis and have given explicit precautions to return to the ER including for any other new or worsening symptoms. The patient understands and accepts the medical plan as it's been dictated and I have answered their questions. Discharge instructions concerning home care and prescriptions have been given. The patient is STABLE and is discharged to home in good condition.  BP 152/82  Pulse 100  Temp(Src) 98 F (36.7 C) (Oral)  Resp 18  Ht 5\' 6"  (1.676 m)  Wt 322 lb (146.058 kg)  BMI 52.00 kg/m2  SpO2 99%  Meds ordered this encounter  Medications  . naproxen (NAPROSYN) 500 MG tablet    Sig: Take 1 tablet (500 mg total) by mouth 2 (two) times daily as needed for mild pain, moderate pain or headache (TAKE WITH MEALS.).    Dispense:  10  tablet    Refill:  0    Order Specific Question:  Supervising Provider    Answer:  Noemi Chapel D [[id number]]  . sulfamethoxazole-trimethoprim (BACTRIM DS) 800-160 MG per tablet    Sig: Take 1 tablet by mouth 2 (two) times daily.    Dispense:  14 tablet    Refill:  0    Order Specific Question:  Supervising Provider    Answer:  Noemi Chapel D [[id number]]  . cephALEXin (KEFLEX) 500 MG capsule    Sig: 2 caps po bid x 7 days    Dispense:  28 capsule    Refill:  0    Order Specific Question:  Supervising Provider    Answer:  Noemi Chapel D [[id number]]  . HYDROcodone-acetaminophen (NORCO) 5-325 MG per tablet    Sig: Take 1-2 tablets by mouth every 6 (six) hours as needed for severe pain.    Dispense:  6 tablet    Refill:  0    Order Specific Question:  Supervising Provider    Answer:  Johnna Acosta [address]     Patty Sermons Camprubi-Soms, PA-C 10/11/13 1234

## 2013-10-11 NOTE — ED Provider Notes (Signed)
Medical screening examination/treatment/procedure(s) were performed by non-physician practitioner and as supervising physician I was immediately available for consultation/collaboration.   EKG Interpretation None        Mariea Clonts, MD 10/11/13 1644

## 2013-10-11 NOTE — Discharge Instructions (Signed)
Keep wound clean and dry. Apply warm compresses to affected area throughout the day. Take antibiotics until they are finished. Take naprosyn and norco as directed, as needed for pain but do not drive or operate machinery with pain medication use. Followup with Primary Care doctor in 2 days for wound recheck.  Return to emergency department for emergent changing or worsening symptoms.   Abscess An abscess (boil or furuncle) is an infected area on or under the skin. This area is filled with yellowish-white fluid (pus) and other material (debris). HOME CARE   Only take medicines as told by your doctor.  If you were given antibiotic medicine, take it as directed. Finish the medicine even if you start to feel better.  If gauze is used, follow your doctor's directions for changing the gauze.  To avoid spreading the infection:  Keep your abscess covered with a bandage.  Wash your hands well.  Do not share personal care items, towels, or whirlpools with others.  Avoid skin contact with others.  Keep your skin and clothes clean around the abscess.  Keep all doctor visits as told. GET HELP RIGHT AWAY IF:   You have more pain, puffiness (swelling), or redness in the wound site.  You have more fluid or blood coming from the wound site.  You have muscle aches, chills, or you feel sick.  You have a fever. MAKE SURE YOU:   Understand these instructions.  Will watch your condition.  Will get help right away if you are not doing well or get worse. Document Released: 06/14/2007 Document Revised: 06/27/2011 Document Reviewed: 03/10/2011 [facility] Patient Information 2015 [place], [place]. This information is not intended to replace advice given to you by your health care provider. Make sure you discuss any questions you have with your health care provider.

## 2013-10-11 NOTE — ED Notes (Signed)
Declined W/C at D/C and was escorted to lobby by RN. 

## 2013-10-11 NOTE — ED Notes (Signed)
Pt c/o abscess to left leg x 1 week. Pt reports started out as a small bump then someone squeezed it and brown stuff came out.

## 2013-10-20 ENCOUNTER — Telehealth: Payer: Self-pay

## 2013-10-20 ENCOUNTER — Other Ambulatory Visit: Payer: Medicaid Other

## 2013-10-20 DIAGNOSIS — A048 Other specified bacterial intestinal infections: Secondary | ICD-10-CM

## 2013-10-20 NOTE — Telephone Encounter (Signed)
Left message for pt to call back  °

## 2013-10-20 NOTE — Telephone Encounter (Signed)
Spoke with pt and she is aware.

## 2013-10-20 NOTE — Telephone Encounter (Signed)
Message copied by Algernon Huxley on Mon Oct 20, 2013 10:17 AM ------      Message from: Marlon Pel      Created: Wed Aug 20, 2013  9:11 AM       Patient will need h pylori stool antigen to confirm eradication of h pylori. Orders are in please notify the patient. See path results from August ------

## 2013-10-23 LAB — HELICOBACTER PYLORI  SPECIAL ANTIGEN: H. PYLORI Antigen: NEGATIVE

## 2013-10-23 NOTE — Telephone Encounter (Signed)
Called to check on Pt. Stated is doing good and finishing antibiotic due to boil on leg.

## 2013-11-05 ENCOUNTER — Encounter (HOSPITAL_COMMUNITY): Payer: Self-pay | Admitting: Emergency Medicine

## 2013-11-05 ENCOUNTER — Emergency Department (HOSPITAL_COMMUNITY)
Admission: EM | Admit: 2013-11-05 | Discharge: 2013-11-05 | Disposition: A | Discharge status: Home / Self Care | Payer: Medicaid Other | Attending: Emergency Medicine | Admitting: Emergency Medicine

## 2013-11-05 DIAGNOSIS — F419 Anxiety disorder, unspecified: Secondary | ICD-10-CM | POA: Insufficient documentation

## 2013-11-05 DIAGNOSIS — Z9981 Dependence on supplemental oxygen: Secondary | ICD-10-CM | POA: Insufficient documentation

## 2013-11-05 DIAGNOSIS — Z79899 Other long term (current) drug therapy: Secondary | ICD-10-CM | POA: Insufficient documentation

## 2013-11-05 DIAGNOSIS — J45909 Unspecified asthma, uncomplicated: Secondary | ICD-10-CM | POA: Insufficient documentation

## 2013-11-05 DIAGNOSIS — H6692 Otitis media, unspecified, left ear: Secondary | ICD-10-CM | POA: Diagnosis not present

## 2013-11-05 DIAGNOSIS — J069 Acute upper respiratory infection, unspecified: Secondary | ICD-10-CM | POA: Insufficient documentation

## 2013-11-05 DIAGNOSIS — H9202 Otalgia, left ear: Secondary | ICD-10-CM | POA: Diagnosis present

## 2013-11-05 DIAGNOSIS — Z87828 Personal history of other (healed) physical injury and trauma: Secondary | ICD-10-CM | POA: Insufficient documentation

## 2013-11-05 DIAGNOSIS — E669 Obesity, unspecified: Secondary | ICD-10-CM | POA: Diagnosis not present

## 2013-11-05 DIAGNOSIS — F329 Major depressive disorder, single episode, unspecified: Secondary | ICD-10-CM | POA: Insufficient documentation

## 2013-11-05 DIAGNOSIS — I10 Essential (primary) hypertension: Secondary | ICD-10-CM | POA: Diagnosis not present

## 2013-11-05 DIAGNOSIS — Z720 Tobacco use: Secondary | ICD-10-CM | POA: Insufficient documentation

## 2013-11-05 DIAGNOSIS — E119 Type 2 diabetes mellitus without complications: Secondary | ICD-10-CM | POA: Diagnosis not present

## 2013-11-05 DIAGNOSIS — G4730 Sleep apnea, unspecified: Secondary | ICD-10-CM | POA: Insufficient documentation

## 2013-11-05 DIAGNOSIS — Z9851 Tubal ligation status: Secondary | ICD-10-CM | POA: Diagnosis not present

## 2013-11-05 DIAGNOSIS — Z792 Long term (current) use of antibiotics: Secondary | ICD-10-CM | POA: Diagnosis not present

## 2013-11-05 DIAGNOSIS — M1990 Unspecified osteoarthritis, unspecified site: Secondary | ICD-10-CM | POA: Insufficient documentation

## 2013-11-05 MED ORDER — AMOXICILLIN 500 MG PO CAPS: 500.0000 mg | ORAL_CAPSULE | Freq: Three times a day (TID) | ORAL | Status: DC

## 2013-11-05 MED ORDER — GUAIFENESIN-CODEINE 100-10 MG/5ML PO SOLN: 5.0000 mL | Freq: Three times a day (TID) | ORAL | Status: DC | PRN

## 2013-11-05 MED ORDER — PREDNISONE 20 MG PO TABS: 60.0000 mg | ORAL_TABLET | Freq: Every day | ORAL | Status: DC

## 2013-11-05 NOTE — ED Provider Notes (Signed)
CSN: [id number]     Arrival date & time 11/05/13  1817 History  This chart was scribed for Hyman Bible, PA with Blanchie Dessert, MD by Edison Simon, ED Scribe. This patient was seen in room TR09C/TR09C and the patient's care was started at 6:51 PM.    Chief Complaint  Patient presents with  . Otalgia   HPI  HPI Comments: Tara Keller is a 48 y.o. female who presents to the Emergency Department complaining of throbbing left ear pain with gradual onset 2-3 days ago. She denies drainage or purulence to her ear. She denies ear injury. She reports associated cough and post nasal drip. She states her cough feels like it will produce something but she is not able to get it up. She also reports nasal congestion, diaphoresis, and feeling hot, though she denies fever. She denies shortness of breath or chest pain. She states she has been using an inhaler at home without remission of symptoms. She reports similar symptoms previously that were improved with antibiotics and cough medicine. She states she smokes 5-6 cigarettes a day.   Past Medical History  Diagnosis Date  . Diabetes mellitus   . Asthma   . Hypertension   . Obesity   . Arthritis   . H/O tubal ligation   . Sleep apnea     uses CPAP  . Anxiety   . Depression   . Fracture closed of upper end of forearm 9 years, Fx left left leg  . Fracture of left lower leg @ 48 years old   Past Surgical History  Procedure Laterality Date  . Cholecystectomy    . Tubal ligation Bilateral   . Colonoscopy with propofol N/A 08/15/2013    Procedure: COLONOSCOPY WITH PROPOFOL;  Surgeon: Jerene Bears, MD;  Location: [facility];  Service: Gastroenterology;  Laterality: N/A;  . Esophagogastroduodenoscopy (egd) with propofol N/A 08/15/2013    Procedure: ESOPHAGOGASTRODUODENOSCOPY (EGD) WITH PROPOFOL;  Surgeon: Jerene Bears, MD;  Location: [facility];  Service: Gastroenterology;  Laterality: N/A;   Family History  Problem Relation Age of Onset  .  Diabetes Mother   . Stroke Mother   . Hypertension Mother   . Cancer Sister    History  Substance Use Topics  . Smoking status: Current Every Day Smoker -- 0.25 packs/day    Types: Cigarettes  . Smokeless tobacco: Never Used  . Alcohol Use: No   OB History   Grav Para Term Preterm Abortions TAB SAB Ect Mult Living   3 3 2 1     1 4      Review of Systems  Constitutional: Positive for diaphoresis. Negative for fever.       Reports feeling hot  HENT: Positive for ear pain and postnasal drip. Negative for ear discharge.   Respiratory: Positive for cough. Negative for shortness of breath.   Cardiovascular: Negative for chest pain.      Allergies  Review of patient's allergies indicates no known allergies.  Home Medications   Prior to Admission medications   Medication Sig Start Date End Date Taking? Authorizing Provider  albuterol (PROVENTIL HFA;VENTOLIN HFA) 108 (90 BASE) MCG/ACT inhaler Inhale 1 puff into the lungs every 6 (six) hours as needed for wheezing or shortness of breath.   Yes Historical Provider, MD  buPROPion (WELLBUTRIN XL) 150 MG 24 hr tablet Take 450 mg by mouth every morning.   Yes Historical Provider, MD  clonazePAM (KLONOPIN) 2 MG tablet Take 2 mg by mouth at bedtime.  Yes Historical Provider, MD  cyclobenzaprine (FLEXERIL) 10 MG tablet Take 10 mg by mouth at bedtime.   Yes Historical Provider, MD  DULoxetine (CYMBALTA) 60 MG capsule Take 60 mg by mouth every morning.   Yes Historical Provider, MD  glimepiride (AMARYL) 4 MG tablet Take 4 mg by mouth daily with breakfast.   Yes Historical Provider, MD  metFORMIN (GLUCOPHAGE) 1000 MG tablet Take 1 tablet (1,000 mg total) by mouth 2 (two) times daily with a meal. 09/30/13  Yes Deepak Advani, MD  naproxen (NAPROSYN) 500 MG tablet Take 500 mg by mouth daily as needed for mild pain. 09/14/13  Yes Shari A Upstill, PA-C  nicotine (NICODERM CQ) 21 mg/24hr patch Place 1 patch (21 mg total) onto the skin daily. 09/30/13   Yes Deepak Advani, MD  pantoprazole (PROTONIX) 40 MG tablet Take 40 mg by mouth daily.   Yes Historical Provider, MD  phentermine 15 MG capsule Take 1 capsule (15 mg total) by mouth every morning. 09/30/13  Yes Lorayne Marek, MD  bismuth-metronidazole-tetracycline (PYLERA) 140-125-125 MG per capsule Take 3 capsules by mouth 4 (four) times daily -  before meals and at bedtime. 08/20/13   Jerene Bears, MD  cephALEXin (KEFLEX) 500 MG capsule 2 caps po bid x 7 days 10/11/13   Patty Sermons Camprubi-Soms, PA-C  HYDROcodone-acetaminophen (NORCO) 5-325 MG per tablet Take 1-2 tablets by mouth every 6 (six) hours as needed for severe pain. 10/11/13   Mercedes Strupp Camprubi-Soms, PA-C  naproxen (NAPROSYN) 500 MG tablet Take 1 tablet (500 mg total) by mouth 2 (two) times daily as needed for mild pain, moderate pain or headache (TAKE WITH MEALS.). 10/11/13   Mercedes Strupp Camprubi-Soms, PA-C  sulfamethoxazole-trimethoprim (BACTRIM DS) 800-160 MG per tablet Take 1 tablet by mouth 2 (two) times daily. 10/11/13   Mercedes Strupp Camprubi-Soms, PA-C   BP 140/95  Pulse 97  Temp(Src) 98.1 F (36.7 C) (Oral)  Resp 20  Ht 5\' 6"  (1.676 m)  Wt 374 lb (169.645 kg)  BMI 60.39 kg/m2  SpO2 98% Physical Exam  Nursing note and vitals reviewed. Constitutional: She is oriented to person, place, and time. She appears well-developed and well-nourished.  HENT:  Head: Normocephalic and atraumatic.  Right Ear: Tympanic membrane and ear canal normal.  Nose: Nose normal.  Mouth/Throat: Oropharynx is clear and moist.  Erythema and bulging of left tympanic membrane, no mastoid tenderness, maxillary sinus tenderness to palpation bilaterally  Eyes: Conjunctivae are normal.  Neck: Normal range of motion. Neck supple.  Cardiovascular: Normal rate, regular rhythm and normal heart sounds.   Pulmonary/Chest: Effort normal and breath sounds normal. No respiratory distress. She has no wheezes. She has no rales.  Lungs sound tight  with coughing, lung sounds decreased diffusely  Musculoskeletal: Normal range of motion.  Neurological: She is alert and oriented to person, place, and time.  Skin: Skin is warm. She is diaphoretic.  Psychiatric: She has a normal mood and affect.    ED Course  Procedures (including critical care time)  DIAGNOSTIC STUDIES: Oxygen Saturation is 98% on room air, normal by my interpretation.    COORDINATION OF CARE: 6:59 PM Discussed treatment plan with patient at beside, the patient agrees with the plan and has no further questions at this time.   Labs Review Labs Reviewed - No data to display  Imaging Review No results found.   EKG Interpretation None      MDM   Final diagnoses:  None   Patient presenting with congestion,  sinus pressure, cough, and ear pain.  Signs and symptoms consistent with URI and AOM of the left ear.  Patient treated with Amoxicillin.  Patient also instructed to use her Albuterol inhaler and given 5 day course of Prednisone.  Patient is not hypoxic.  No signs of respiratory distress.  Patient stable for discharge.  Return precautions given.     Hyman Bible, PA-C 11/05/13 2230

## 2013-11-05 NOTE — ED Notes (Signed)
Pt reports bilateral ear pain and sinus drainage with cough x 3 days. Pt reports feeling hot and sweaty too.

## 2013-11-06 NOTE — ED Provider Notes (Signed)
Medical screening examination/treatment/procedure(s) were performed by non-physician practitioner and as supervising physician I was immediately available for consultation/collaboration.   EKG Interpretation None        Blanchie Dessert, MD 11/06/13 [phone]

## 2013-11-10 ENCOUNTER — Encounter (HOSPITAL_COMMUNITY): Payer: Self-pay | Admitting: Emergency Medicine

## 2013-11-18 ENCOUNTER — Encounter (HOSPITAL_COMMUNITY): Payer: Self-pay | Admitting: Emergency Medicine

## 2013-11-18 ENCOUNTER — Telehealth: Payer: Self-pay | Admitting: Internal Medicine

## 2013-11-18 ENCOUNTER — Emergency Department (HOSPITAL_COMMUNITY)
Admission: EM | Admit: 2013-11-18 | Discharge: 2013-11-18 | Disposition: A | Discharge status: Nursing Home (Includes ICF) | Payer: Medicaid Other | Source: Home / Self Care | Attending: Family Medicine | Admitting: Family Medicine

## 2013-11-18 ENCOUNTER — Emergency Department (HOSPITAL_COMMUNITY)
Admission: EM | Admit: 2013-11-18 | Discharge: 2013-11-19 | Disposition: A | Discharge status: Home / Self Care | Payer: Medicaid Other | Attending: Emergency Medicine | Admitting: Emergency Medicine

## 2013-11-18 ENCOUNTER — Telehealth: Payer: Self-pay

## 2013-11-18 ENCOUNTER — Encounter (HOSPITAL_COMMUNITY): Payer: Self-pay | Admitting: *Deleted

## 2013-11-18 ENCOUNTER — Emergency Department (HOSPITAL_COMMUNITY): Payer: Medicaid Other

## 2013-11-18 DIAGNOSIS — E669 Obesity, unspecified: Secondary | ICD-10-CM | POA: Diagnosis not present

## 2013-11-18 DIAGNOSIS — F329 Major depressive disorder, single episode, unspecified: Secondary | ICD-10-CM | POA: Diagnosis not present

## 2013-11-18 DIAGNOSIS — E1165 Type 2 diabetes mellitus with hyperglycemia: Secondary | ICD-10-CM | POA: Insufficient documentation

## 2013-11-18 DIAGNOSIS — Z791 Long term (current) use of non-steroidal anti-inflammatories (NSAID): Secondary | ICD-10-CM | POA: Diagnosis not present

## 2013-11-18 DIAGNOSIS — Z8781 Personal history of (healed) traumatic fracture: Secondary | ICD-10-CM | POA: Insufficient documentation

## 2013-11-18 DIAGNOSIS — G4730 Sleep apnea, unspecified: Secondary | ICD-10-CM | POA: Insufficient documentation

## 2013-11-18 DIAGNOSIS — R7989 Other specified abnormal findings of blood chemistry: Secondary | ICD-10-CM | POA: Diagnosis not present

## 2013-11-18 DIAGNOSIS — F419 Anxiety disorder, unspecified: Secondary | ICD-10-CM | POA: Diagnosis not present

## 2013-11-18 DIAGNOSIS — R945 Abnormal results of liver function studies: Secondary | ICD-10-CM

## 2013-11-18 DIAGNOSIS — Z9049 Acquired absence of other specified parts of digestive tract: Secondary | ICD-10-CM | POA: Diagnosis not present

## 2013-11-18 DIAGNOSIS — Z79899 Other long term (current) drug therapy: Secondary | ICD-10-CM | POA: Diagnosis not present

## 2013-11-18 DIAGNOSIS — J45901 Unspecified asthma with (acute) exacerbation: Secondary | ICD-10-CM | POA: Diagnosis not present

## 2013-11-18 DIAGNOSIS — R739 Hyperglycemia, unspecified: Secondary | ICD-10-CM

## 2013-11-18 DIAGNOSIS — Z720 Tobacco use: Secondary | ICD-10-CM | POA: Insufficient documentation

## 2013-11-18 DIAGNOSIS — R1013 Epigastric pain: Secondary | ICD-10-CM | POA: Diagnosis not present

## 2013-11-18 DIAGNOSIS — I10 Essential (primary) hypertension: Secondary | ICD-10-CM | POA: Insufficient documentation

## 2013-11-18 DIAGNOSIS — R112 Nausea with vomiting, unspecified: Secondary | ICD-10-CM | POA: Diagnosis not present

## 2013-11-18 DIAGNOSIS — R0602 Shortness of breath: Secondary | ICD-10-CM

## 2013-11-18 DIAGNOSIS — Z7952 Long term (current) use of systemic steroids: Secondary | ICD-10-CM | POA: Diagnosis not present

## 2013-11-18 DIAGNOSIS — Z9981 Dependence on supplemental oxygen: Secondary | ICD-10-CM | POA: Insufficient documentation

## 2013-11-18 DIAGNOSIS — M1990 Unspecified osteoarthritis, unspecified site: Secondary | ICD-10-CM | POA: Diagnosis not present

## 2013-11-18 DIAGNOSIS — Z9851 Tubal ligation status: Secondary | ICD-10-CM | POA: Insufficient documentation

## 2013-11-18 DIAGNOSIS — Z792 Long term (current) use of antibiotics: Secondary | ICD-10-CM | POA: Insufficient documentation

## 2013-11-18 DIAGNOSIS — R10816 Epigastric abdominal tenderness: Secondary | ICD-10-CM

## 2013-11-18 DIAGNOSIS — Z9889 Other specified postprocedural states: Secondary | ICD-10-CM | POA: Diagnosis not present

## 2013-11-18 DIAGNOSIS — J9801 Acute bronchospasm: Secondary | ICD-10-CM

## 2013-11-18 LAB — COMPREHENSIVE METABOLIC PANEL
ALK PHOS: 353 U/L — AB (ref 39–117)
ALT: 228 U/L — ABNORMAL HIGH (ref 0–35)
AST: 155 U/L — AB (ref 0–37)
Albumin: 3.5 g/dL (ref 3.5–5.2)
Anion gap: 14 (ref 5–15)
BILIRUBIN TOTAL: 0.7 mg/dL (ref 0.3–1.2)
BUN: 5 mg/dL — ABNORMAL LOW (ref 6–23)
CHLORIDE: 91 meq/L — AB (ref 96–112)
CO2: 28 mEq/L (ref 19–32)
CREATININE: 0.58 mg/dL (ref 0.50–1.10)
Calcium: 9.9 mg/dL (ref 8.4–10.5)
GFR calc Af Amer: >90 mL/min (ref >90)
Glucose, Bld: 384 mg/dL — ABNORMAL HIGH (ref 70–99)
POTASSIUM: 3.6 meq/L — AB (ref 3.7–5.3)
Sodium: 133 mEq/L — ABNORMAL LOW (ref 137–147)
Total Protein: 8.1 g/dL (ref 6.0–8.3)

## 2013-11-18 LAB — CBC WITH DIFFERENTIAL/PLATELET
BASOS ABS: 0 10*3/uL (ref 0.0–0.1)
Basophils Relative: 0 % (ref 0–1)
Eosinophils Absolute: 0.2 10*3/uL (ref 0.0–0.7)
Eosinophils Relative: 3 % (ref 0–5)
HEMATOCRIT: 43.1 % (ref 36.0–46.0)
Hemoglobin: 14.4 g/dL (ref 12.0–15.0)
LYMPHS PCT: 45 % (ref 12–46)
Lymphs Abs: 3.4 10*3/uL (ref 0.7–4.0)
MCH: 27.3 pg (ref 26.0–34.0)
MCHC: 33.4 g/dL (ref 30.0–36.0)
MCV: 81.6 fL (ref 78.0–100.0)
MONO ABS: 0.6 10*3/uL (ref 0.1–1.0)
Monocytes Relative: 8 % (ref 3–12)
Neutro Abs: 3.4 10*3/uL (ref 1.7–7.7)
Neutrophils Relative %: 44 % (ref 43–77)
Platelets: 279 10*3/uL (ref 150–400)
RBC: 5.28 MIL/uL — ABNORMAL HIGH (ref 3.87–5.11)
RDW: 14 % (ref 11.5–15.5)
WBC: 7.6 10*3/uL (ref 4.0–10.5)

## 2013-11-18 LAB — CBG MONITORING, ED
GLUCOSE-CAPILLARY: 442 mg/dL — AB (ref 70–99)
GLUCOSE-CAPILLARY: 395 mg/dL — AB (ref 70–99)
GLUCOSE-CAPILLARY: 423 mg/dL — AB (ref 70–99)

## 2013-11-18 LAB — URINALYSIS, ROUTINE W REFLEX MICROSCOPIC
BILIRUBIN URINE: NEGATIVE
KETONES UR: NEGATIVE mg/dL
Leukocytes, UA: NEGATIVE
NITRITE: NEGATIVE
PH: 6.5 (ref 5.0–8.0)
Protein, ur: NEGATIVE mg/dL
Specific Gravity, Urine: 1.024 (ref 1.005–1.030)
Urobilinogen, UA: 0.2 mg/dL (ref 0.0–1.0)

## 2013-11-18 LAB — URINE MICROSCOPIC-ADD ON

## 2013-11-18 LAB — LIPASE, BLOOD: LIPASE: 33 U/L (ref 11–59)

## 2013-11-18 MED ORDER — IOHEXOL 300 MG/ML  SOLN
25.0000 mL | Freq: Once | INTRAMUSCULAR | Status: AC | PRN
  Administered 2013-11-18: 25 mL via ORAL

## 2013-11-18 MED ORDER — SODIUM CHLORIDE 0.9 % IV BOLUS (SEPSIS)
1000.0000 mL | Freq: Once | INTRAVENOUS | Status: AC
  Administered 2013-11-18: 1000 mL via INTRAVENOUS

## 2013-11-18 MED ORDER — ONDANSETRON HCL 4 MG/2ML IJ SOLN
4.0000 mg | Freq: Once | INTRAMUSCULAR | Status: AC
  Administered 2013-11-18: 4 mg via INTRAVENOUS
  Filled 2013-11-18: qty 2

## 2013-11-18 MED ORDER — IOHEXOL 300 MG/ML  SOLN
100.0000 mL | Freq: Once | INTRAMUSCULAR | Status: AC | PRN
  Administered 2013-11-18: 100 mL via INTRAVENOUS

## 2013-11-18 MED ORDER — HYDROMORPHONE HCL 1 MG/ML IJ SOLN
0.5000 mg | Freq: Once | INTRAMUSCULAR | Status: AC
  Administered 2013-11-18: 0.5 mg via INTRAVENOUS
  Filled 2013-11-18: qty 1

## 2013-11-18 NOTE — ED Notes (Signed)
PA at bedside.

## 2013-11-18 NOTE — ED Notes (Signed)
Ambulated pt in the hallway with pulse ox. o2 98% room air, heart rate 110

## 2013-11-18 NOTE — Telephone Encounter (Signed)
Returned patient phone call Patient not available Left message on voice mail to return our call 

## 2013-11-18 NOTE — Telephone Encounter (Addendum)
Returned patient's call. States seen in ED last week and treated for sinus infection and ear infection States she has completed antibiotic and prednisone States she's had no relief from cough with albuterol or rx cough syrup States she is coughing all night long productive of yellow sputum States she feels nauseated and has vomited after eating last 2 days States she feels like she is getting wore instead of better  Appt offered at 4:30 pm today Patient states she will not have a ride at that time and prefers to go to Urgent Care.

## 2013-11-18 NOTE — Telephone Encounter (Signed)
Pt wen to ED last week due to URI and says symptoms have not resolved and would like to schedule an appt today. Next available is next week and would like to discuss is she should go to Urgent care. Please f/u with pt.

## 2013-11-18 NOTE — ED Provider Notes (Signed)
CSN: [id number]     Arrival date & time 11/18/13  1405 History   First MD Initiated Contact with Patient 11/18/13 1421     Chief Complaint  Patient presents with  . Abdominal Pain   (Consider location/radiation/quality/duration/timing/severity/associated sxs/prior Treatment) HPI Comments: Morbidly obese 48 year old female complaining of a cough and upper respiratory symptoms. She was seen in the emergency department 11/05/2013 for the symptoms and sinus infection, and treated withan antitussive which she was unable to get feel and an antibiotic. Her cough is persistent.  She also complains of an exacerbation of pain and dysfunction in the esophagus and epigastrium. She states there is pain with eating and her appetite has decreased. The pain starts to the top of the esophagus and includes the entire abdomen.   Past Medical History  Diagnosis Date  . Diabetes mellitus   . Asthma   . Hypertension   . Obesity   . Arthritis   . H/O tubal ligation   . Sleep apnea     uses CPAP  . Anxiety   . Depression   . Fracture closed of upper end of forearm 9 years, Fx left left leg  . Fracture of left lower leg @ 48 years old   Past Surgical History  Procedure Laterality Date  . Cholecystectomy    . Tubal ligation Bilateral   . Colonoscopy with propofol N/A 08/15/2013    Procedure: COLONOSCOPY WITH PROPOFOL;  Surgeon: Jerene Bears, MD;  Location: [facility];  Service: Gastroenterology;  Laterality: N/A;  . Esophagogastroduodenoscopy (egd) with propofol N/A 08/15/2013    Procedure: ESOPHAGOGASTRODUODENOSCOPY (EGD) WITH PROPOFOL;  Surgeon: Jerene Bears, MD;  Location: [facility];  Service: Gastroenterology;  Laterality: N/A;   Family History  Problem Relation Age of Onset  . Diabetes Mother   . Stroke Mother   . Hypertension Mother   . Cancer Sister    History  Substance Use Topics  . Smoking status: Current Every Day Smoker -- 0.25 packs/day    Types: Cigarettes  . Smokeless tobacco:  Never Used  . Alcohol Use: No   OB History    Gravida Para Term Preterm AB TAB SAB Ectopic Multiple Living   3 3 2 1     1 4      Review of Systems  Constitutional: Negative for fever.  HENT: Positive for congestion, postnasal drip and rhinorrhea.   Respiratory: Positive for cough. Negative for shortness of breath.   Gastrointestinal: Positive for abdominal pain. Negative for vomiting.  Genitourinary: Negative.   Skin: Negative.   Neurological: Negative.     Allergies  Review of patient's allergies indicates no known allergies.  Home Medications   Prior to Admission medications   Medication Sig Start Date End Date Taking? Authorizing Provider  albuterol (PROVENTIL HFA;VENTOLIN HFA) 108 (90 BASE) MCG/ACT inhaler Inhale 1 puff into the lungs every 6 (six) hours as needed for wheezing or shortness of breath.    Historical Provider, MD  amoxicillin (AMOXIL) 500 MG capsule Take 1 capsule (500 mg total) by mouth 3 (three) times daily. 11/05/13   Heather Laisure, PA-C  buPROPion (WELLBUTRIN XL) 150 MG 24 hr tablet Take 450 mg by mouth every morning.    Historical Provider, MD  cephALEXin (KEFLEX) 500 MG capsule Take 500 mg by mouth 2 (two) times daily. x 7 days. Completed course of medication on 11-01-13 10/11/13   Patty Sermons Camprubi-Soms, PA-C  clonazePAM (KLONOPIN) 2 MG tablet Take 2 mg by mouth at bedtime.  Historical Provider, MD  cyclobenzaprine (FLEXERIL) 10 MG tablet Take 10 mg by mouth at bedtime.    Historical Provider, MD  DULoxetine (CYMBALTA) 60 MG capsule Take 60 mg by mouth every morning.    Historical Provider, MD  glimepiride (AMARYL) 4 MG tablet Take 4 mg by mouth daily with breakfast.    Historical Provider, MD  guaiFENesin-codeine 100-10 MG/5ML syrup Take 5 mLs by mouth 3 (three) times daily as needed for cough. 11/05/13   Heather Laisure, PA-C  metFORMIN (GLUCOPHAGE) 1000 MG tablet Take 1 tablet (1,000 mg total) by mouth 2 (two) times daily with a meal. 09/30/13    Lorayne Marek, MD  naproxen (NAPROSYN) 500 MG tablet Take 500 mg by mouth daily as needed for mild pain. 09/14/13   Shari A Upstill, PA-C  nicotine (NICODERM CQ) 21 mg/24hr patch Place 1 patch (21 mg total) onto the skin daily. 09/30/13   Lorayne Marek, MD  pantoprazole (PROTONIX) 40 MG tablet Take 40 mg by mouth daily.    Historical Provider, MD  phentermine 15 MG capsule Take 1 capsule (15 mg total) by mouth every morning. 09/30/13   Lorayne Marek, MD  predniSONE (DELTASONE) 20 MG tablet Take 3 tablets (60 mg total) by mouth daily. 11/05/13   Heather Laisure, PA-C  sulfamethoxazole-trimethoprim (BACTRIM DS) 800-160 MG per tablet Take 1 tablet by mouth 2 (two) times daily. Completed course of medication on 11-01-13 10/11/13   Patty Sermons Camprubi-Soms, PA-C   BP 127/82 mmHg  Pulse 107  Temp(Src) 98.2 F (36.8 C)  Resp 20  SpO2 95% Physical Exam  Constitutional: She is oriented to person, place, and time. She appears well-developed and well-nourished. No distress.  HENT:  Mouth/Throat: No oropharyngeal exudate.  bilat TM's nl OP with frothy PND  Eyes: Conjunctivae and EOM are normal.  Neck: Normal range of motion. Neck supple.  Cardiovascular: Normal rate, regular rhythm and normal heart sounds.   Pulmonary/Chest: Effort normal.  Taking deep breaths elicits cough spasms and wheeze with auscultation.  Abdominal:  Abdomen severely obese, mildly distended. Light palpation to epigastrium produces exquisite "unbearable" pain.  Musculoskeletal: Normal range of motion.  Lymphadenopathy:    She has no cervical adenopathy.  Neurological: She is alert and oriented to person, place, and time.  Skin: Skin is warm and dry.  Psychiatric: She has a normal mood and affect.  Nursing note and vitals reviewed.   ED Course  Procedures (including critical care time) Labs Review Labs Reviewed - No data to display  Imaging Review No results found.   MDM   1. Epigastric pain   2. Abdominal  tenderness, epigastric   3. Cough due to bronchospasm     Transfer to ED via shuttle for evaluation of abdominal pain, severe epigastric tenderness  She also has  cough due to  bronchospasm.     Janne Napoleon, NP 11/18/13 1450

## 2013-11-18 NOTE — ED Provider Notes (Signed)
CSN: [id number]     Arrival date & time 11/18/13  1520 History   First MD Initiated Contact with Patient 11/18/13 1739     Chief Complaint  Patient presents with  . Abdominal Pain  . Shortness of Breath     (Consider location/radiation/quality/duration/timing/severity/associated sxs/prior Treatment) HPI Comments: The patient is a 48 year old female sent from urgent care past no history of diabetes, asthma, obesity, Barrett's esophagus, reflux presenting to the emergency room with chief complaint of epigastric discomfort for 3 days. Patient reports noncompliance with Protonix for 3-4 days due to a an out of town funeral, 2.5 hour drive. She reports onset of epigastric sharp pain, burning pain since. Worsened with food, laying back to sleep. She also report a bad taste in her mouth. Reports similar symptoms when evaluated by pyrtlye in the past. She also complains of persistent cough with dyspnea on exertion, since Sunday with associated with cold like symptoms. No recent long travel, family history or personal history of DVT/PE, lower extremity swelling, smoking, cancer, or exogenous estrogen.  Reports home glucose 200-500 "depending on what I eat". Reports compliance with DM medication. PCP: Lorayne Marek, MD GI: [facility]   The history is provided by the patient. A language interpreter was used.    Past Medical History  Diagnosis Date  . Diabetes mellitus   . Asthma   . Hypertension   . Obesity   . Arthritis   . H/O tubal ligation   . Sleep apnea     uses CPAP  . Anxiety   . Depression   . Fracture closed of upper end of forearm 9 years, Fx left left leg  . Fracture of left lower leg @ 48 years old   Past Surgical History  Procedure Laterality Date  . Cholecystectomy    . Tubal ligation Bilateral   . Colonoscopy with propofol N/A 08/15/2013    Procedure: COLONOSCOPY WITH PROPOFOL;  Surgeon: Jerene Bears, MD;  Location: [facility];  Service: Gastroenterology;  Laterality: N/A;   . Esophagogastroduodenoscopy (egd) with propofol N/A 08/15/2013    Procedure: ESOPHAGOGASTRODUODENOSCOPY (EGD) WITH PROPOFOL;  Surgeon: Jerene Bears, MD;  Location: [facility];  Service: Gastroenterology;  Laterality: N/A;   Family History  Problem Relation Age of Onset  . Diabetes Mother   . Stroke Mother   . Hypertension Mother   . Cancer Sister    History  Substance Use Topics  . Smoking status: Current Every Day Smoker -- 0.25 packs/day    Types: Cigarettes  . Smokeless tobacco: Never Used  . Alcohol Use: No   OB History    Gravida Para Term Preterm AB TAB SAB Ectopic Multiple Living   3 3 2 1     1 4      Review of Systems  Constitutional: Negative for fever and chills.  Respiratory: Positive for cough. Negative for shortness of breath.   Gastrointestinal: Positive for nausea, vomiting and abdominal pain. Negative for diarrhea and constipation.  Genitourinary: Negative for hematuria.      Allergies  Review of patient's allergies indicates no known allergies.  Home Medications   Prior to Admission medications   Medication Sig Start Date End Date Taking? Authorizing Provider  albuterol (PROVENTIL HFA;VENTOLIN HFA) 108 (90 BASE) MCG/ACT inhaler Inhale 1 puff into the lungs every 6 (six) hours as needed for wheezing or shortness of breath.    Historical Provider, MD  amoxicillin (AMOXIL) 500 MG capsule Take 1 capsule (500 mg total) by mouth 3 (three) times  daily. 11/05/13   Heather Laisure, PA-C  buPROPion (WELLBUTRIN XL) 150 MG 24 hr tablet Take 450 mg by mouth every morning.    Historical Provider, MD  cephALEXin (KEFLEX) 500 MG capsule Take 500 mg by mouth 2 (two) times daily. x 7 days. Completed course of medication on 11-01-13 10/11/13   Patty Sermons Camprubi-Soms, PA-C  clonazePAM (KLONOPIN) 2 MG tablet Take 2 mg by mouth at bedtime.    Historical Provider, MD  cyclobenzaprine (FLEXERIL) 10 MG tablet Take 10 mg by mouth at bedtime.    Historical Provider, MD   DULoxetine (CYMBALTA) 60 MG capsule Take 60 mg by mouth every morning.    Historical Provider, MD  glimepiride (AMARYL) 4 MG tablet Take 4 mg by mouth daily with breakfast.    Historical Provider, MD  guaiFENesin-codeine 100-10 MG/5ML syrup Take 5 mLs by mouth 3 (three) times daily as needed for cough. 11/05/13   Heather Laisure, PA-C  metFORMIN (GLUCOPHAGE) 1000 MG tablet Take 1 tablet (1,000 mg total) by mouth 2 (two) times daily with a meal. 09/30/13   Lorayne Marek, MD  naproxen (NAPROSYN) 500 MG tablet Take 500 mg by mouth daily as needed for mild pain. 09/14/13   Shari A Upstill, PA-C  nicotine (NICODERM CQ) 21 mg/24hr patch Place 1 patch (21 mg total) onto the skin daily. 09/30/13   Lorayne Marek, MD  pantoprazole (PROTONIX) 40 MG tablet Take 40 mg by mouth daily.    Historical Provider, MD  phentermine 15 MG capsule Take 1 capsule (15 mg total) by mouth every morning. 09/30/13   Lorayne Marek, MD  predniSONE (DELTASONE) 20 MG tablet Take 3 tablets (60 mg total) by mouth daily. 11/05/13   Heather Laisure, PA-C  sulfamethoxazole-trimethoprim (BACTRIM DS) 800-160 MG per tablet Take 1 tablet by mouth 2 (two) times daily. Completed course of medication on 11-01-13 10/11/13   Patty Sermons Camprubi-Soms, PA-C   BP 148/84 mmHg  Pulse 96  Temp(Src) 97.8 F (36.6 C) (Oral)  Resp 16  SpO2 96%  LMP 09/10/2013 Physical Exam  Constitutional: She is oriented to person, place, and time. She appears well-developed and well-nourished. No distress.  Obese female  HENT:  Head: Normocephalic and atraumatic.  Eyes: EOM are normal.  Neck: Neck supple.  Cardiovascular: Normal rate and regular rhythm.   Pulmonary/Chest: Effort normal. No respiratory distress. She has no wheezes. She has no rales.  Abdominal: Soft. There is tenderness in the epigastric area. There is guarding.  Musculoskeletal: Normal range of motion.  Neurological: She is alert and oriented to person, place, and time.  Skin: Skin is warm  and dry.  Psychiatric: She has a normal mood and affect. Her behavior is normal.  Nursing note and vitals reviewed.   ED Course  Procedures (including critical care time) Labs Review Labs Reviewed  CBC WITH DIFFERENTIAL - Abnormal; Notable for the following:    RBC 5.28 (*)    All other components within normal limits  COMPREHENSIVE METABOLIC PANEL - Abnormal; Notable for the following:    Sodium 133 (*)    Potassium 3.6 (*)    Chloride 91 (*)    Glucose, Bld 384 (*)    BUN 5 (*)    AST 155 (*)    ALT 228 (*)    Alkaline Phosphatase 353 (*)    All other components within normal limits  URINALYSIS, ROUTINE W REFLEX MICROSCOPIC - Abnormal; Notable for the following:    Glucose, UA >1000 (*)    Hgb  urine dipstick MODERATE (*)    All other components within normal limits  URINE MICROSCOPIC-ADD ON - Abnormal; Notable for the following:    Bacteria, UA FEW (*)    All other components within normal limits  CBG MONITORING, ED - Abnormal; Notable for the following:    Glucose-Capillary 442 (*)    All other components within normal limits  CBG MONITORING, ED - Abnormal; Notable for the following:    Glucose-Capillary 395 (*)    All other components within normal limits  CBG MONITORING, ED - Abnormal; Notable for the following:    Glucose-Capillary 423 (*)    All other components within normal limits  CBG MONITORING, ED - Abnormal; Notable for the following:    Glucose-Capillary 363 (*)    All other components within normal limits  LIPASE, BLOOD  I-STAT TROPOININ, ED    Imaging Review Dg Chest 2 View  11/18/2013   CLINICAL DATA:  Productive cough, vomiting, shortness of breath for 2 weeks.  EXAM: CHEST  2 VIEW  COMPARISON:  01/17/2013  FINDINGS: Mild peribronchial thickening. Heart and mediastinal contours are within normal limits. No focal opacities or effusions. No acute bony abnormality.  IMPRESSION: Mild bronchitic changes.   Electronically Signed   By: Rolm Baptise M.D.    On: 11/18/2013 16:53   US Abdomen Complete  11/18/2013   CLINICAL DATA:  Elevated liver function tests.  ICD10: R 79.89  EXAM: ULTRASOUND ABDOMEN COMPLETE  COMPARISON:  MRI 07/29/2013  FINDINGS: Gallbladder: Surgically absent  Common bile duct: Diameter: Poorly visualized. Grossly normal at 8 mm.  Liver: Enlarged and increased in echogenicity.  IVC: Not visualized due to patient body habitus and bowel gas.  Pancreas: Not visualized secondary to overlying bowel gas.  Spleen: Size and appearance within normal limits.  Right Kidney: Length: 15.3 cm. Echogenicity within normal limits. No mass or hydronephrosis visualized.  Left Kidney: Length: 15.4 cm. Echogenicity within normal limits. No mass or hydronephrosis visualized.  Abdominal aorta: Partially obscured by overlying bowel gas.  Other findings: No ascites.  IMPRESSION: 1. Hepatomegaly and hepatic steatosis. 2. Moderately degraded exam secondary to patient body habitus and overlying bowel gas.   Electronically Signed   By: Abigail Miyamoto M.D.   On: 11/18/2013 19:08   Ct Abdomen Pelvis W Contrast  11/18/2013   CLINICAL DATA:  Upper abdominal pain and diarrhea since Sunday, pain while eating. Treated for sinus infection October 2015. History of morbid obesity.  EXAM: CT ABDOMEN AND PELVIS WITH CONTRAST  TECHNIQUE: Multidetector CT imaging of the abdomen and pelvis was performed using the standard protocol following bolus administration of intravenous contrast.  CONTRAST:  179mL OMNIPAQUE IOHEXOL 300 MG/ML  SOLN  COMPARISON:  CT of the abdomen and pelvis May 15, 2013 and MRI of the liver July 29, 2013.  FINDINGS: Large body habitus results in overall noisy image quality.  LUNG BASES: Included view of the lung bases demonstrate 4 mm LEFT lower lobe sub solid pulmonary nodule, below size surveillance recommendations. Visualized heart and pericardium are unremarkable.  SOLID ORGANS: The spleen, pancreas and adrenal glands are unremarkable. Status post  cholecystectomy. Extremely hypodense, mildly heterogeneous liver and hepatomegaly, liver is 26 cm in craniocaudad dimension.  GASTROINTESTINAL TRACT: The stomach, small and large bowel are normal in course and caliber without inflammatory changes. Enteric contrast has not yet reached the distal small bowel. Normal appendix.  KIDNEYS/ URINARY TRACT: Kidneys are orthotopic, demonstrating symmetric enhancement. Bilateral, RIGHT greater than LEFT cortical scarring. 11 mm  hypodensity cyst in lower pole of RIGHT kidney. No nephrolithiasis, hydronephrosis or solid renal masses. The unopacified ureters are normal in course and caliber. Delayed imaging through the kidneys demonstrates symmetric prompt contrast excretion within the proximal urinary collecting system. Urinary bladder is partially distended and unremarkable.  PERITONEUM/RETROPERITONEUM: No intraperitoneal free fluid nor free air. Aortoiliac vessels are normal in course and caliber. No lymphadenopathy by CT size criteria. Internal reproductive organs are unchanged; sub serosal 2.8 cm fundal leiomyoma.  SOFT TISSUE/OSSEOUS STRUCTURES: Nonsuspicious. Rectus abdominus diastases. Small fat containing umbilical hernia. Surgical clips along the anterior abdominal wall.  IMPRESSION: No acute intra-abdominal or pelvic process on this habitus limited examination.  Re demonstration of severe hepatic steatosis, hepatomegaly and possible hepatic fibrosis.   Electronically Signed   By: Elon Alas   On: 11/18/2013 22:10     EKG Interpretation   Date/Time:  Tuesday November 18 2013 15:31:33 EST Ventricular Rate:  101 PR Interval:  172 QRS Duration: 82 QT Interval:  370 QTC Calculation: 479 R Axis:   10 Text Interpretation:  Sinus tachycardia Left ventricular hypertrophy  Abnormal ECG Sinus tachycardia Left ventricular hypertrophy No significant  change since last tracing Abnormal ekg Confirmed by Carmin Muskrat  MD  [phone]) on 11/18/2013 6:08:30 PM       MDM   Final diagnoses:  Elevated LFTs  Epigastric abdominal pain  Hyperglycemia   Patient presents from urgent care with epigastric discomfort, history of reflux, elevated transaminases and hyperglycemia. Plan to treat for elevated glucose, not in DKA. Pain and questionable peptic ulcer disease for his reflux. Pt complains of URI symptoms, no signs of DVT or risk factors on exam.Plant to treat for pain and treat elevated glucose. 1915 Re-eval pt reports resolution of symptoms. repeate abdominal exam shows persistant epigastric discomfort. US shows hepatomegaly and hepatic steatosis poor study due to habitus and gas pattern, plan to CT for persistent pain. CT shows hepatic steatosis sand hepatomegaly and possible hepatic fibrosis.  CBG: 423 Re-eval pt sitting on bed, discussed plan to reduce glucose and CT results with the patient. Given pts elevated LFTs, recent negative Hep C panel, plan to D/C Amaryl given side effects. Discussed Endocrinology follow up with the pt. Discussed lab results, imaging results, and treatment plan with the patient. Return precautions given. Reports understanding and no other concerns at this time.  Patient is stable for discharge at this time.  Meds given in ED:  Medications  sodium chloride 0.9 % bolus 1,000 mL (0 mLs Intravenous Stopped 11/18/13 2040)  ondansetron (ZOFRAN) injection 4 mg (4 mg Intravenous Given 11/18/13 1818)  HYDROmorphone (DILAUDID) injection 0.5 mg (0.5 mg Intravenous Given 11/18/13 1818)  iohexol (OMNIPAQUE) 300 MG/ML solution 25 mL (25 mLs Oral Contrast Given 11/18/13 2022)  iohexol (OMNIPAQUE) 300 MG/ML solution 100 mL (100 mLs Intravenous Contrast Given 11/18/13 2123)  sodium chloride 0.9 % bolus 1,000 mL (1,000 mLs Intravenous New Bag/Given 11/18/13 2300)    New Prescriptions   No medications on file    Harvie Heck, PA-C 11/19/13 2042  Carmin Muskrat, MD 11/19/13 2324

## 2013-11-18 NOTE — ED Notes (Signed)
Multiple complaints.  Patient seen and treated for sinus infection and ear pain on 10/28.  Patient reports she is still feeling bad.  Patient also reports ongoing diarrhea, onset 3 days ago.  2-3 episodes per day.

## 2013-11-18 NOTE — ED Notes (Signed)
Went to pcp on 10/28 for ear infection.  Prescribe hydro codeine and antibiotics. Now, having epigastric abd. Pain. Hurts on palpation. Productive cough and rib pain with coughing.

## 2013-11-18 NOTE — ED Notes (Signed)
Pt finished drinking contrast, notified CT tech

## 2013-11-19 ENCOUNTER — Telehealth: Payer: Self-pay | Admitting: Internal Medicine

## 2013-11-19 LAB — CBG MONITORING, ED: Glucose-Capillary: 363 mg/dL — ABNORMAL HIGH (ref 70–99)

## 2013-11-19 NOTE — Discharge Instructions (Signed)
Call for a follow up appointment with a Family or Primary Care Provider for further evaluation of your elevated glucose.  Call your GI specialist for further evaluation of your elevated liver function tests. Call an endocrinologist for further evaluation of your elevated glucose. Stop taking your glimepiride, until you are evaluated by an endocrinologist and a GI specialist. Return if Symptoms worsen.   Take medication as prescribed.

## 2013-11-19 NOTE — Telephone Encounter (Signed)
Pt was seen in the ER last night with nausea, vomiting, and epigastric pain. Pt is a diabetic and was told to follow-up with GI. Pt scheduled to see Tye Savoy NP tomorrow at 1:30pm. Pt aware if appt.

## 2013-11-20 ENCOUNTER — Encounter: Payer: Self-pay | Admitting: Nurse Practitioner

## 2013-11-20 ENCOUNTER — Telehealth: Payer: Self-pay | Admitting: Internal Medicine

## 2013-11-20 ENCOUNTER — Other Ambulatory Visit (INDEPENDENT_AMBULATORY_CARE_PROVIDER_SITE_OTHER): Payer: Medicaid Other

## 2013-11-20 ENCOUNTER — Ambulatory Visit (INDEPENDENT_AMBULATORY_CARE_PROVIDER_SITE_OTHER): Payer: Medicaid Other | Admitting: Nurse Practitioner

## 2013-11-20 VITALS — BP 126/70 | HR 96 | Ht 66.0 in | Wt 349.1 lb

## 2013-11-20 DIAGNOSIS — R112 Nausea with vomiting, unspecified: Secondary | ICD-10-CM

## 2013-11-20 DIAGNOSIS — R945 Abnormal results of liver function studies: Principal | ICD-10-CM

## 2013-11-20 DIAGNOSIS — R1013 Epigastric pain: Secondary | ICD-10-CM | POA: Diagnosis not present

## 2013-11-20 DIAGNOSIS — R7989 Other specified abnormal findings of blood chemistry: Secondary | ICD-10-CM | POA: Diagnosis not present

## 2013-11-20 DIAGNOSIS — K760 Fatty (change of) liver, not elsewhere classified: Secondary | ICD-10-CM

## 2013-11-20 LAB — BASIC METABOLIC PANEL
BUN: 5 mg/dL — ABNORMAL LOW (ref 6–23)
CALCIUM: 9.5 mg/dL (ref 8.4–10.5)
CO2: 24 mEq/L (ref 19–32)
CREATININE: 0.7 mg/dL (ref 0.4–1.2)
Chloride: 94 mEq/L — ABNORMAL LOW (ref 96–112)
GFR: 119.67 mL/min (ref >60.00)
Glucose, Bld: 350 mg/dL — ABNORMAL HIGH (ref 70–99)
Potassium: 3.6 mEq/L (ref 3.5–5.1)
Sodium: 132 mEq/L — ABNORMAL LOW (ref 135–145)

## 2013-11-20 LAB — HEPATIC FUNCTION PANEL
ALT: 206 U/L — ABNORMAL HIGH (ref 0–35)
AST: 128 U/L — ABNORMAL HIGH (ref 0–37)
Albumin: 3 g/dL — ABNORMAL LOW (ref 3.5–5.2)
Alkaline Phosphatase: 287 U/L — ABNORMAL HIGH (ref 39–117)
BILIRUBIN TOTAL: 0.6 mg/dL (ref 0.2–1.2)
Bilirubin, Direct: 0.2 mg/dL (ref 0.0–0.3)
Total Protein: 7.7 g/dL (ref 6.0–8.3)

## 2013-11-20 MED ORDER — METOCLOPRAMIDE HCL 5 MG PO TABS: 5.0000 mg | ORAL_TABLET | Freq: Three times a day (TID) | ORAL | Status: DC

## 2013-11-20 NOTE — Patient Instructions (Addendum)
Please keep your appointment with your Primary Care Doctor   Call in two weeks to schedule a follow up with Tye Savoy, NP OR just give an update on how you are feeling  Please do small frequent meals  Your Nurse Practioner has requested that you go to the basement for the following lab work before leaving today: LFT  BMET   We have sent the following medications to your pharmacy for you to pick up at your convenience: Reglan 5 mg, please take one tablet by mouth thirty minutes before meals and at bedtime  ____________________________________________________________________________________________________________________________________  Gastroparesis  Gastroparesis is also called slowed stomach emptying (delayed gastric emptying). It is a condition in which the stomach takes too long to empty its contents. It often happens in people with diabetes.  CAUSES  Gastroparesis happens when nerves to the stomach are damaged or stop working. When the nerves are damaged, the muscles of the stomach and intestines do not work normally. The movement of food is slowed or stopped. High blood glucose (sugar) causes changes in nerves and can damage the blood vessels that carry oxygen and nutrients to the nerves. RISK FACTORS  Diabetes.  Post-viral syndromes.  Eating disorders (anorexia, bulimia).  Surgery on the stomach or vagus nerve.  Gastroesophageal reflux disease (rarely).  Smooth muscle disorders (amyloidosis, scleroderma).  Metabolic disorders, including hypothyroidism.  Parkinson disease. SYMPTOMS   Heartburn.  Feeling sick to your stomach (nausea).  Vomiting of undigested food.  An early feeling of fullness when eating.  Weight loss.  Abdominal bloating.  Erratic blood glucose levels.  Lack of appetite.  Gastroesophageal reflux.  Spasms of the stomach wall. Complications can include:  Bacterial overgrowth in stomach. Food stays in the stomach and can ferment and  cause bacteria to grow.  Weight loss due to difficulty digesting and absorbing nutrients.  Vomiting.  Obstruction in the stomach. Undigested food can harden and cause nausea and vomiting.  Blood glucose fluctuations caused by inconsistent food absorption. DIAGNOSIS  The diagnosis of gastroparesis is confirmed through one or more of the following tests:  Barium X-rays and scans. These tests look at how long it takes for food to move through the stomach.  Gastric manometry. This test measures electrical and muscular activity in the stomach. A thin tube is passed down the throat into the stomach. The tube contains a wire that takes measurements of the stomach's electrical and muscular activity as it digests liquids and solid food.  Endoscopy. This procedure is done with a long, thin tube called an endoscope. It is passed through the mouth and gently down the esophagus into the stomach. This tube helps the caregiver look at the lining of the stomach to check for any abnormalities.  Ultrasonography. This can rule out gallbladder disease or pancreatitis. This test will outline and define the shape of the gallbladder and pancreas. TREATMENT   Treatments may include:  Exercise.  Medicines to control nausea and vomiting.  Medicines to stimulate stomach muscles.  Changes in what and when you eat.  Having smaller meals more often.  Eating low-fiber forms of high-fiber foods, such as eating cooked vegetables instead of raw vegetables.  Eating low-fat foods.  Consuming liquids, which are easier to digest.  In severe cases, feeding tubes and intravenous (IV) feeding may be needed. It is important to note that in most cases, treatment does not cure gastroparesis. It is usually a lasting (chronic) condition. Treatment helps you manage the underlying condition so that you can be as  healthy and comfortable as possible. Other treatments  A gastric neurostimulator has been developed to assist  people with gastroparesis. The battery-operated device is surgically implanted. It emits mild electrical pulses to help improve stomach emptying and to control nausea and vomiting.  The use of botulinum toxin has been shown to improve stomach emptying by decreasing the prolonged contractions of the muscle between the stomach and the small intestine (pyloric sphincter). The benefits are temporary. SEEK MEDICAL CARE IF:   You have diabetes and you are having problems keeping your blood glucose in goal range.  You are having nausea, vomiting, bloating, or early feelings of fullness with eating.  Your symptoms do not change with a change in diet. Document Released: 12/26/2004 Document Revised: 04/22/2012 Document Reviewed: 06/04/2008 [facility] Patient Information 2015 [place], [place]. This information is not intended to replace advice given to you by your health care provider. Make sure you discuss any questions you have with your health care provider.

## 2013-11-20 NOTE — Telephone Encounter (Signed)
Pt. Calling back about blood sugar levels, please f/u with pt.

## 2013-11-20 NOTE — Telephone Encounter (Signed)
Patient checked her sugar levels at 11am today and they were high. They have been up and down since she got out of the hospital since 11/10. Please call patient to advise before her appt on the 18th.

## 2013-11-20 NOTE — Progress Notes (Addendum)
History of Present Illness:  Patient is a 48 year old female known to Dr. Hilarie Fredrickson. She was seen by him July of this year for evaluation of epigastric pain radiating around each side and through to her back. Her lipase was elevated but CT scan in May did not suggest pancreatitis, she also had elevated transaminases but known fatty liver disease. For further evaluation of epigastric pain patient underwent upper endoscopy August 2015 with findings of a 3cm HH and  possible Barrett's esophagus, exam otherwise normal. Esophageal biopsies negative for Barrett's. Gastric biopsies c/w chronic active gastritis with h.pylori. Patient was treated with Pylera.  She had screening colonoscopy done on the same day . Colon polyps found, path c/w adenomas.   Patient was seen at urgent care 2 days ago. She had a cough but complained of epigastric pain. Patient was sent to ED. EKG showed sinus tachycardia, left ventricular hypertrophy. Transaminases were elevated with AST at 155, AST 228, they were 48 and 72 respectively July 2015. Alkaline phosphatase elevated at 353, glucose markedly elevated. White count normal, hemoglobin normal at 14.4. Ultrasound compatible with hepatomegaly with steatosis (not a great exam secondary to body habitus and overlying bowel gas). Common bile duct was poorly visualized but grossly normal at 8 mm. Patient is status post cholecystectomy  Patient tells me she is not really having epigastric pain. Her main problem is nausea and vomiting no matter what she eats or drinks. The nausea and vomiting occur 3-4 hours after any by mouth, even crackers. Patient is only on metformin for her blood sugars which are running in the high 300's  Current Medications, Allergies, Past Medical History, Past Surgical History, Family History and Social History were reviewed in [employer] record.  Studies:   Dg Chest 2 View  11/18/2013   CLINICAL DATA:  Productive cough, vomiting,  shortness of breath for 2 weeks.  EXAM: CHEST  2 VIEW  COMPARISON:  01/17/2013  FINDINGS: Mild peribronchial thickening. Heart and mediastinal contours are within normal limits. No focal opacities or effusions. No acute bony abnormality.  IMPRESSION: Mild bronchitic changes.   Electronically Signed   By: Rolm Baptise M.D.   On: 11/18/2013 16:53   US Abdomen Complete  11/18/2013   CLINICAL DATA:  Elevated liver function tests.  ICD10: R 79.89  EXAM: ULTRASOUND ABDOMEN COMPLETE  COMPARISON:  MRI 07/29/2013  FINDINGS: Gallbladder: Surgically absent  Common bile duct: Diameter: Poorly visualized. Grossly normal at 8 mm.  Liver: Enlarged and increased in echogenicity.  IVC: Not visualized due to patient body habitus and bowel gas.  Pancreas: Not visualized secondary to overlying bowel gas.  Spleen: Size and appearance within normal limits.  Right Kidney: Length: 15.3 cm. Echogenicity within normal limits. No mass or hydronephrosis visualized.  Left Kidney: Length: 15.4 cm. Echogenicity within normal limits. No mass or hydronephrosis visualized.  Abdominal aorta: Partially obscured by overlying bowel gas.  Other findings: No ascites.  IMPRESSION: 1. Hepatomegaly and hepatic steatosis. 2. Moderately degraded exam secondary to patient body habitus and overlying bowel gas.   Electronically Signed   By: Abigail Miyamoto M.D.   On: 11/18/2013 19:08   Ct Abdomen Pelvis W Contrast  11/18/2013   CLINICAL DATA:  Upper abdominal pain and diarrhea since Sunday, pain while eating. Treated for sinus infection October 2015. History of morbid obesity.  EXAM: CT ABDOMEN AND PELVIS WITH CONTRAST  TECHNIQUE: Multidetector CT imaging of the abdomen and pelvis was performed using the standard protocol following  bolus administration of intravenous contrast.  CONTRAST:  110mL OMNIPAQUE IOHEXOL 300 MG/ML  SOLN  COMPARISON:  CT of the abdomen and pelvis May 15, 2013 and MRI of the liver July 29, 2013.  FINDINGS: Large body habitus results in  overall noisy image quality.  LUNG BASES: Included view of the lung bases demonstrate 4 mm LEFT lower lobe sub solid pulmonary nodule, below size surveillance recommendations. Visualized heart and pericardium are unremarkable.  SOLID ORGANS: The spleen, pancreas and adrenal glands are unremarkable. Status post cholecystectomy. Extremely hypodense, mildly heterogeneous liver and hepatomegaly, liver is 26 cm in craniocaudad dimension.  GASTROINTESTINAL TRACT: The stomach, small and large bowel are normal in course and caliber without inflammatory changes. Enteric contrast has not yet reached the distal small bowel. Normal appendix.  KIDNEYS/ URINARY TRACT: Kidneys are orthotopic, demonstrating symmetric enhancement. Bilateral, RIGHT greater than LEFT cortical scarring. 11 mm hypodensity cyst in lower pole of RIGHT kidney. No nephrolithiasis, hydronephrosis or solid renal masses. The unopacified ureters are normal in course and caliber. Delayed imaging through the kidneys demonstrates symmetric prompt contrast excretion within the proximal urinary collecting system. Urinary bladder is partially distended and unremarkable.  PERITONEUM/RETROPERITONEUM: No intraperitoneal free fluid nor free air. Aortoiliac vessels are normal in course and caliber. No lymphadenopathy by CT size criteria. Internal reproductive organs are unchanged; sub serosal 2.8 cm fundal leiomyoma.  SOFT TISSUE/OSSEOUS STRUCTURES: Nonsuspicious. Rectus abdominus diastases. Small fat containing umbilical hernia. Surgical clips along the anterior abdominal wall.  IMPRESSION: No acute intra-abdominal or pelvic process on this habitus limited examination.  Re demonstration of severe hepatic steatosis, hepatomegaly and possible hepatic fibrosis.   Electronically Signed   By: Elon Alas   On: 11/18/2013 22:10   Physical Exam: General: Pleasant, black female in no acute distress Head: Normocephalic and atraumatic Eyes:  sclerae anicteric,  conjunctiva pink  Ears: Normal auditory acuity Lungs: Clear throughout to auscultation Heart: Regular rate and rhythm Abdomen: Soft, obese, non-tender. No obvious masses.  Normal bowel sounds Musculoskeletal: Symmetrical with no gross deformities  Extremities: No edema  Neurological: Alert oriented x 4, grossly nonfocal Psychological:  Alert and cooperative. Normal mood and affect  Assessment and Recommendations:  63. 48 year old female with pp nausea and vomiting. Suspect her stomach is not emptying normally with blood sugars consistently staying in high 300's. She has had an increase in LFTs but doubt nausea and vomiting related. She has NO abdominal pain. I called patient PCP's about her uncontrolled blood sugars. She will likely require insulin and has appt with PCP on 11/19. In meantime will try low dose reglan. Follow diabetic diet. If nausea / vomiting persists after blood sugars improve then patient will need further workup.   2. Acute on chronic elevated LFTs. Known fatty liver and known with possible fibrosis on CTscan but pattern of recent LFTs atypical for fatty liver.  I wonder if the amoxicillin caused these LFTs abnormalities. Now done with amoxicillin, will repeat LFTs and call her with results and recommendations. If not improving will need further workup.   3. Weight loss. She was 368 in July, down to 349 today. Probably due in part to #1. Contrast CTscan in May without worrisome findings.   Addendum: Reviewed and agree with initial management. Would repeat LFTs in 2 weeks and if persistently elevated pursue lab evaluation for other causes (viral, autoimmune, etc) Jerene Bears, MD   Addendum by Tye Savoy, NP 11/25/13 - her abnormal LFTs were already worked up by [name] at  last visit. Autoimmune / genetic / viral markers were all negative. Her LFTs are improving (off amoxicillin)

## 2013-11-21 ENCOUNTER — Telehealth: Payer: Self-pay | Admitting: Emergency Medicine

## 2013-11-21 ENCOUNTER — Encounter: Payer: Self-pay | Admitting: Nurse Practitioner

## 2013-11-21 DIAGNOSIS — R112 Nausea with vomiting, unspecified: Secondary | ICD-10-CM | POA: Insufficient documentation

## 2013-11-21 NOTE — Telephone Encounter (Signed)
Spoke with pt, instructed she will need to wait until appt with Dr. Annitta Needs 11/18 Instructed pt to go directly to ED if BS>500 States her BS is coming down

## 2013-11-26 ENCOUNTER — Ambulatory Visit: Payer: Medicaid Other | Attending: Internal Medicine | Admitting: Internal Medicine

## 2013-11-26 ENCOUNTER — Encounter: Payer: Self-pay | Admitting: Internal Medicine

## 2013-11-26 ENCOUNTER — Other Ambulatory Visit: Payer: Self-pay | Admitting: *Deleted

## 2013-11-26 VITALS — BP 120/78 | HR 87 | Temp 98.0°F | Resp 16 | Wt 341.6 lb

## 2013-11-26 DIAGNOSIS — Z9049 Acquired absence of other specified parts of digestive tract: Secondary | ICD-10-CM | POA: Insufficient documentation

## 2013-11-26 DIAGNOSIS — F17210 Nicotine dependence, cigarettes, uncomplicated: Secondary | ICD-10-CM | POA: Diagnosis not present

## 2013-11-26 DIAGNOSIS — R05 Cough: Secondary | ICD-10-CM | POA: Insufficient documentation

## 2013-11-26 DIAGNOSIS — Z794 Long term (current) use of insulin: Secondary | ICD-10-CM | POA: Diagnosis not present

## 2013-11-26 DIAGNOSIS — F329 Major depressive disorder, single episode, unspecified: Secondary | ICD-10-CM | POA: Insufficient documentation

## 2013-11-26 DIAGNOSIS — F419 Anxiety disorder, unspecified: Secondary | ICD-10-CM | POA: Diagnosis not present

## 2013-11-26 DIAGNOSIS — E1165 Type 2 diabetes mellitus with hyperglycemia: Secondary | ICD-10-CM | POA: Diagnosis present

## 2013-11-26 DIAGNOSIS — R0981 Nasal congestion: Secondary | ICD-10-CM | POA: Insufficient documentation

## 2013-11-26 DIAGNOSIS — I10 Essential (primary) hypertension: Secondary | ICD-10-CM | POA: Insufficient documentation

## 2013-11-26 DIAGNOSIS — R7989 Other specified abnormal findings of blood chemistry: Secondary | ICD-10-CM

## 2013-11-26 DIAGNOSIS — G4730 Sleep apnea, unspecified: Secondary | ICD-10-CM | POA: Diagnosis not present

## 2013-11-26 DIAGNOSIS — J45909 Unspecified asthma, uncomplicated: Secondary | ICD-10-CM | POA: Diagnosis not present

## 2013-11-26 DIAGNOSIS — R945 Abnormal results of liver function studies: Secondary | ICD-10-CM

## 2013-11-26 DIAGNOSIS — Z79899 Other long term (current) drug therapy: Secondary | ICD-10-CM | POA: Insufficient documentation

## 2013-11-26 DIAGNOSIS — R059 Cough, unspecified: Secondary | ICD-10-CM

## 2013-11-26 DIAGNOSIS — E139 Other specified diabetes mellitus without complications: Secondary | ICD-10-CM

## 2013-11-26 LAB — POCT URINALYSIS DIPSTICK
BILIRUBIN UA: NEGATIVE
Glucose, UA: 500
KETONES UA: NEGATIVE
LEUKOCYTES UA: NEGATIVE
Nitrite, UA: NEGATIVE
PH UA: 6.5
Protein, UA: NEGATIVE
Spec Grav, UA: 1.01
Urobilinogen, UA: 1

## 2013-11-26 LAB — GLUCOSE, POCT (MANUAL RESULT ENTRY)
POC GLUCOSE: 408 mg/dL — AB (ref 70–99)
POC GLUCOSE: 376 mg/dL — AB (ref 70–99)

## 2013-11-26 MED ORDER — "INSULIN SYRINGE-NEEDLE U-100 31G X 5/16"" 0.3 ML MISC": Status: DC

## 2013-11-26 MED ORDER — INSULIN GLULISINE 100 UNIT/ML SOLOSTAR PEN: 15.0000 [IU] | PEN_INJECTOR | Freq: Every day | SUBCUTANEOUS | Status: DC

## 2013-11-26 MED ORDER — INSULIN ASPART 100 UNIT/ML ~~LOC~~ SOLN
20.0000 [IU] | Freq: Once | SUBCUTANEOUS | Status: AC
  Administered 2013-11-26: 20 [IU] via SUBCUTANEOUS

## 2013-11-26 MED ORDER — FLUTICASONE PROPIONATE 50 MCG/ACT NA SUSP: 2.0000 | Freq: Every day | NASAL | Status: DC

## 2013-11-26 MED ORDER — "NEEDLE (DISP) 24G X 1"" MISC": Status: DC

## 2013-11-26 NOTE — Progress Notes (Signed)
Patient here for follow up Complains of having a dry cough for about three weeks Patient is concerned her blood sugars have been running up and down Today blood sugar is fasting  And elevated 20 units of novolog given per protocol

## 2013-11-26 NOTE — Patient Instructions (Signed)
Diabetes Mellitus and Food It is important for you to manage your blood sugar (glucose) level. Your blood glucose level can be greatly affected by what you eat. Eating healthier foods in the appropriate amounts throughout the day at about the same time each day will help you control your blood glucose level. It can also help slow or prevent worsening of your diabetes mellitus. Healthy eating may even help you improve the level of your blood pressure and reach or maintain a healthy weight.  HOW CAN FOOD AFFECT ME? Carbohydrates Carbohydrates affect your blood glucose level more than any other type of food. Your dietitian will help you determine how many carbohydrates to eat at each meal and teach you how to count carbohydrates. Counting carbohydrates is important to keep your blood glucose at a healthy level, especially if you are using insulin or taking certain medicines for diabetes mellitus. Alcohol Alcohol can cause sudden decreases in blood glucose (hypoglycemia), especially if you use insulin or take certain medicines for diabetes mellitus. Hypoglycemia can be a life-threatening condition. Symptoms of hypoglycemia (sleepiness, dizziness, and disorientation) are similar to symptoms of having too much alcohol.  If your health care provider has given you approval to drink alcohol, do so in moderation and use the following guidelines:  Women should not have more than one drink per day, and men should not have more than two drinks per day. One drink is equal to:  12 oz of beer.  5 oz of wine.  1 oz of hard liquor.  Do not drink on an empty stomach.  Keep yourself hydrated. Have water, diet soda, or unsweetened iced tea.  Regular soda, juice, and other mixers might contain a lot of carbohydrates and should be counted. WHAT FOODS ARE NOT RECOMMENDED? As you make food choices, it is important to remember that all foods are not the same. Some foods have fewer nutrients per serving than other  foods, even though they might have the same number of calories or carbohydrates. It is difficult to get your body what it needs when you eat foods with fewer nutrients. Examples of foods that you should avoid that are high in calories and carbohydrates but low in nutrients include:  Trans fats (most processed foods list trans fats on the Nutrition Facts label).  Regular soda.  Juice.  Candy.  Sweets, such as cake, pie, doughnuts, and cookies.  Fried foods. WHAT FOODS CAN I EAT? Have nutrient-rich foods, which will nourish your body and keep you healthy. The food you should eat also will depend on several factors, including:  The calories you need.  The medicines you take.  Your weight.  Your blood glucose level.  Your blood pressure level.  Your cholesterol level. You also should eat a variety of foods, including:  Protein, such as meat, poultry, fish, tofu, nuts, and seeds (lean animal proteins are best).  Fruits.  Vegetables.  Dairy products, such as milk, cheese, and yogurt (low fat is best).  Breads, grains, pasta, cereal, rice, and beans.  Fats such as olive oil, trans fat-free margarine, canola oil, avocado, and olives. DOES EVERYONE WITH DIABETES MELLITUS HAVE THE SAME MEAL PLAN? Because every person with diabetes mellitus is different, there is not one meal plan that works for everyone. It is very important that you meet with a dietitian who will help you create a meal plan that is just right for you. Document Released: 09/22/2004 Document Revised: 12/31/2012 Document Reviewed: 11/22/2012 [employer] Patient Information 2015 [employer]. This   information is not intended to replace advice given to you by your health care provider. Make sure you discuss any questions you have with your health care provider.  

## 2013-11-26 NOTE — Progress Notes (Signed)
MRN: [id number] Name: Tara Keller  Sex: female Age: 48 y.o. DOB: 1965-06-16  Allergies: Review of patient's allergies indicates no known allergies.  Chief Complaint  Patient presents with  . Follow-up    HPI: Patient is 48 y.o. female who  was recently seen by GI with the symptoms of abdominal pain patient was advised to follow with [name] since her blood sugar levels have been running high and that might contribute to gastric emptying problem, she was already started on Reglan and pending further workup until her diabetes is improved, on the last visit I had discussed with patient about starting on insulin. Today her blood sugar is elevated as per patient she has not eaten today, patient reported that her lowest blood sugar has been around 2 80 mg/dL, she is only taking metformin 1 g twice a day. Patient is given insulin and her repeat blood sugar is improved, urine is negative for ketones.patient also complains of some dry cough denies any chest pain or shortness of breath does feel mucus running down the back of her throat.  Past Medical History  Diagnosis Date  . Diabetes mellitus   . Asthma   . Hypertension   . Obesity   . Arthritis   . H/O tubal ligation   . Sleep apnea     uses CPAP  . Anxiety   . Depression   . Fracture closed of upper end of forearm 9 years, Fx left left leg  . Fracture of left lower leg @ 48 years old    Past Surgical History  Procedure Laterality Date  . Cholecystectomy    . Tubal ligation Bilateral   . Colonoscopy with propofol N/A 08/15/2013    Procedure: COLONOSCOPY WITH PROPOFOL;  Surgeon: Jerene Bears, MD;  Location: [facility];  Service: Gastroenterology;  Laterality: N/A;  . Esophagogastroduodenoscopy (egd) with propofol N/A 08/15/2013    Procedure: ESOPHAGOGASTRODUODENOSCOPY (EGD) WITH PROPOFOL;  Surgeon: Jerene Bears, MD;  Location: [facility];  Service: Gastroenterology;  Laterality: N/A;      Medication List       This list is  accurate as of: 11/26/13 12:12 PM.  Always use your most recent med list.               albuterol 108 (90 BASE) MCG/ACT inhaler  Commonly known as:  PROVENTIL HFA;VENTOLIN HFA  Inhale 1 puff into the lungs every 6 (six) hours as needed for wheezing or shortness of breath.     buPROPion 150 MG 24 hr tablet  Commonly known as:  WELLBUTRIN XL  Take 450 mg by mouth every morning.     DULoxetine 60 MG capsule  Commonly known as:  CYMBALTA  Take 120 mg by mouth every morning.     fluticasone 50 MCG/ACT nasal spray  Commonly known as:  FLONASE  Place 2 sprays into both nostrils daily.     Insulin Glulisine 100 UNIT/ML Solostar Pen  Commonly known as:  APIDRA  Inject 15 Units into the skin at bedtime.     Insulin Syringe-Needle U-100 31G X 5/16" 0.3 ML Misc  Use with solostar pen     metFORMIN 1000 MG tablet  Commonly known as:  GLUCOPHAGE  Take 1 tablet (1,000 mg total) by mouth 2 (two) times daily with a meal.     metoCLOPramide 5 MG tablet  Commonly known as:  REGLAN  Take 1 tablet (5 mg total) by mouth 4 (four) times daily -  before meals  and at bedtime.     NEEDLE (DISP) 24 G 24G X 1" Misc  Commonly known as:  B-D DISP NEEDLE 24GX1"  Use with solostar pen     pantoprazole 40 MG tablet  Commonly known as:  PROTONIX  Take 40 mg by mouth daily.     sucralfate 1 GM/10ML suspension  Commonly known as:  CARAFATE  Take 1 g by mouth 4 (four) times daily.        Meds ordered this encounter  Medications  . insulin aspart (novoLOG) injection 20 Units    Sig:   . fluticasone (FLONASE) 50 MCG/ACT nasal spray    Sig: Place 2 sprays into both nostrils daily.    Dispense:  16 g    Refill:  6  . Insulin Glulisine (APIDRA) 100 UNIT/ML Solostar Pen    Sig: Inject 15 Units into the skin at bedtime.    Dispense:  15 mL    Refill:  3  . Insulin Syringe-Needle U-100 31G X 5/16" 0.3 ML MISC    Sig: Use with solostar pen    Dispense:  100 each    Refill:  1  . NEEDLE, DISP, 24  G (B-D DISP NEEDLE 24GX1") 24G X 1" MISC    Sig: Use with solostar pen    Dispense:  100 each    Refill:  1    Immunization History  Administered Date(s) Administered  . Hepatitis A, Adult 08/22/2013    Family History  Problem Relation Age of Onset  . Diabetes Mother   . Stroke Mother   . Hypertension Mother   . Cancer Sister     History  Substance Use Topics  . Smoking status: Current Every Day Smoker -- 0.25 packs/day    Types: Cigarettes  . Smokeless tobacco: Never Used  . Alcohol Use: No    Review of Systems   As noted in HPI  Filed Vitals:   11/26/13 1137  BP: 120/78  Pulse: 87  Temp: 98 F (36.7 C)  Resp: 16    Physical Exam  Physical Exam  HENT:  Nasal congestion no sinus tenderness   Eyes: EOM are normal. Pupils are equal, round, and reactive to light.  Cardiovascular: Normal rate and regular rhythm.   Pulmonary/Chest: Breath sounds normal. No respiratory distress. She has no wheezes. She has no rales.  Musculoskeletal: She exhibits no edema.    CBC    Component Value Date/Time   WBC 7.6 11/18/2013 1542   RBC 5.28* 11/18/2013 1542   HGB 14.4 11/18/2013 1542   HCT 43.1 11/18/2013 1542   PLT 279 11/18/2013 1542   MCV 81.6 11/18/2013 1542   LYMPHSABS 3.4 11/18/2013 1542   MONOABS 0.6 11/18/2013 1542   EOSABS 0.2 11/18/2013 1542   BASOSABS 0.0 11/18/2013 1542    CMP     Component Value Date/Time   NA 132* 11/20/2013 1458   K 3.6 11/20/2013 1458   CL 94* 11/20/2013 1458   CO2 24 11/20/2013 1458   GLUCOSE 350* 11/20/2013 1458   BUN 5* 11/20/2013 1458   CREATININE 0.7 11/20/2013 1458   CREATININE 0.80 07/31/2012 1648   CALCIUM 9.5 11/20/2013 1458   PROT 7.7 11/20/2013 1458   ALBUMIN 3.0* 11/20/2013 1458   AST 128* 11/20/2013 1458   ALT 206* 11/20/2013 1458   ALKPHOS 287* 11/20/2013 1458   BILITOT 0.6 11/20/2013 1458   GFRNONAA >90 11/18/2013 1542   GFRAA >90 11/18/2013 1542    Lab Results  Component Value  Date/Time   CHOL 127  08/15/2012 10:58 AM    No components found for: HGA1C  Lab Results  Component Value Date/Time   AST 128* 11/20/2013 02:58 PM    Assessment and Plan  Other specified diabetes mellitus without complications - Plan:  Results for orders placed or performed in visit on 11/26/13  Glucose (CBG)  Result Value Ref Range   POC Glucose 408 (A) 70 - 99 mg/dl  Urinalysis Dipstick  Result Value Ref Range   Color, UA yellow    Clarity, UA clear    Glucose, UA 500    Bilirubin, UA neg    Ketones, UA neg    Spec Grav, UA 1.010    Blood, UA moderate    pH, UA 6.5    Protein, UA neg    Urobilinogen, UA 1.0    Nitrite, UA neg    Leukocytes, UA Negative   Glucose (CBG)  Result Value Ref Range   POC Glucose 376 (A) 70 - 99 mg/dl   Glucose (CBG), insulin aspart (novoLOG) injection 20 Units, Urinalysis Dipstick, diabetes is uncontrolled her blood sugars have been elevated, urine is negative for ketones, she will be started on Lantus, she'll start taking 15 units each bedtime and monitor blood sugar advised patient she can increase the dose if her fasting sugar is persistently more than 200, she was keep the fingerstick log her and bring in 2 weeks and follow with the nurse. She'll continue with metformin. Advised patient for diabetes meal planning. Insulin Glulisine (APIDRA) 100 UNIT/ML Solostar Pen, Insulin Syringe-Needle U-100 31G X 5/16" 0.3 ML MISC, NEEDLE, DISP, 24 G (B-D DISP NEEDLE 24GX1") 24G X 1" MISC, Glucose (CBG)  Cough OTC cough medication when necessary  Nasal congestion - Plan:Trial of fluticasone (FLONASE) 50 MCG/ACT nasal spray    Return in about 3 months (around 02/26/2014) for diabetes, CBG check in 2 weeks/Nurse Visit.  Lorayne Marek, MD

## 2013-11-27 ENCOUNTER — Telehealth: Payer: Self-pay | Admitting: Emergency Medicine

## 2013-11-27 ENCOUNTER — Other Ambulatory Visit: Payer: Self-pay

## 2013-11-27 ENCOUNTER — Telehealth: Payer: Self-pay | Admitting: Internal Medicine

## 2013-11-27 DIAGNOSIS — E139 Other specified diabetes mellitus without complications: Secondary | ICD-10-CM

## 2013-11-27 MED ORDER — INSULIN GLULISINE 100 UNIT/ML SOLOSTAR PEN: 15.0000 [IU] | PEN_INJECTOR | Freq: Every day | SUBCUTANEOUS | Status: DC

## 2013-11-27 NOTE — Telephone Encounter (Signed)
Issues resolved per Langley Gauss

## 2013-11-27 NOTE — Telephone Encounter (Signed)
Pharmacy, [facility] on [address], needs authorization for prescriptions that were sent yesterday for MCD to cover charges.  Please f/u with pharmacy and pt.

## 2013-11-28 ENCOUNTER — Telehealth: Payer: Self-pay | Admitting: Internal Medicine

## 2013-11-28 ENCOUNTER — Other Ambulatory Visit: Payer: Self-pay

## 2013-11-28 ENCOUNTER — Telehealth: Payer: Self-pay

## 2013-11-28 MED ORDER — INSULIN GLARGINE 100 UNIT/ML SOLOSTAR PEN: 15.0000 [IU] | PEN_INJECTOR | Freq: Every day | SUBCUTANEOUS | Status: DC

## 2013-11-28 NOTE — Telephone Encounter (Signed)
Spoke with patient and she is aware a prescription for lantus Was sent to the [facility] on file

## 2013-11-28 NOTE — Telephone Encounter (Signed)
Pt was prescribed insulin that is not covered by medicaid. Pt needs this to be authorized or have another insulin prescribed to her that is covered by medicaid. Please follow up with pt.

## 2013-12-01 ENCOUNTER — Telehealth: Payer: Self-pay | Admitting: Internal Medicine

## 2013-12-01 NOTE — Telephone Encounter (Signed)
Patient calling to request a referral to diabetes/nutrionist class. Please assist patient.

## 2013-12-03 ENCOUNTER — Telehealth: Payer: Self-pay | Admitting: Emergency Medicine

## 2013-12-03 ENCOUNTER — Other Ambulatory Visit: Payer: Self-pay | Admitting: Emergency Medicine

## 2013-12-03 DIAGNOSIS — E119 Type 2 diabetes mellitus without complications: Secondary | ICD-10-CM

## 2013-12-03 NOTE — Telephone Encounter (Signed)
Pt was prescribed insulin that is not covered by medicaid. Pt needs this to be authorized or have another insulin prescribed to her that is covered by medicaid. Please follow up with pt.

## 2013-12-03 NOTE — Telephone Encounter (Signed)
Pt aware of Diabetes education referral Pt also c/o elevated blood sugars No nurse visit available until 12/10 Pt given walk-in hours

## 2013-12-03 NOTE — Telephone Encounter (Signed)
Left message with husband referral placed for Diabetes/Nutrition classes

## 2013-12-08 ENCOUNTER — Ambulatory Visit: Payer: Medicaid Other | Admitting: Physician Assistant

## 2013-12-11 ENCOUNTER — Telehealth: Payer: Self-pay | Admitting: *Deleted

## 2013-12-11 NOTE — Telephone Encounter (Signed)
Patient notified and will come for labs. 

## 2013-12-11 NOTE — Telephone Encounter (Signed)
-----   Message from Hulan Saas, RN sent at 11/26/2013  8:19 AM EST ----- Call and remind patient due for LFT for PG on 12/15/13. Lab in [facility].

## 2013-12-11 NOTE — Telephone Encounter (Signed)
Left a message for patient to call back. 

## 2013-12-19 ENCOUNTER — Other Ambulatory Visit (INDEPENDENT_AMBULATORY_CARE_PROVIDER_SITE_OTHER): Payer: Medicaid Other

## 2013-12-19 DIAGNOSIS — R7989 Other specified abnormal findings of blood chemistry: Secondary | ICD-10-CM

## 2013-12-19 DIAGNOSIS — R945 Abnormal results of liver function studies: Secondary | ICD-10-CM

## 2013-12-19 LAB — HEPATIC FUNCTION PANEL
ALK PHOS: 212 U/L — AB (ref 39–117)
ALT: 69 U/L — ABNORMAL HIGH (ref 0–35)
AST: 58 U/L — ABNORMAL HIGH (ref 0–37)
Albumin: 3.4 g/dL — ABNORMAL LOW (ref 3.5–5.2)
BILIRUBIN DIRECT: 0.1 mg/dL (ref 0.0–0.3)
Total Bilirubin: 0.4 mg/dL (ref 0.2–1.2)
Total Protein: 7.4 g/dL (ref 6.0–8.3)

## 2013-12-22 ENCOUNTER — Other Ambulatory Visit: Payer: Self-pay | Admitting: *Deleted

## 2013-12-22 DIAGNOSIS — R7989 Other specified abnormal findings of blood chemistry: Secondary | ICD-10-CM

## 2013-12-22 DIAGNOSIS — R945 Abnormal results of liver function studies: Secondary | ICD-10-CM

## 2013-12-30 ENCOUNTER — Encounter: Payer: Self-pay | Admitting: Internal Medicine

## 2013-12-30 ENCOUNTER — Ambulatory Visit: Payer: Medicaid Other | Attending: Internal Medicine | Admitting: Internal Medicine

## 2013-12-30 VITALS — BP 145/86 | HR 92 | Temp 98.8°F | Resp 16 | Wt 341.6 lb

## 2013-12-30 DIAGNOSIS — J45909 Unspecified asthma, uncomplicated: Secondary | ICD-10-CM | POA: Diagnosis not present

## 2013-12-30 DIAGNOSIS — R05 Cough: Secondary | ICD-10-CM | POA: Insufficient documentation

## 2013-12-30 DIAGNOSIS — H9190 Unspecified hearing loss, unspecified ear: Secondary | ICD-10-CM | POA: Insufficient documentation

## 2013-12-30 DIAGNOSIS — R0981 Nasal congestion: Secondary | ICD-10-CM | POA: Insufficient documentation

## 2013-12-30 DIAGNOSIS — Z794 Long term (current) use of insulin: Secondary | ICD-10-CM | POA: Diagnosis not present

## 2013-12-30 DIAGNOSIS — H538 Other visual disturbances: Secondary | ICD-10-CM | POA: Diagnosis not present

## 2013-12-30 DIAGNOSIS — R059 Cough, unspecified: Secondary | ICD-10-CM

## 2013-12-30 DIAGNOSIS — I10 Essential (primary) hypertension: Secondary | ICD-10-CM | POA: Diagnosis not present

## 2013-12-30 DIAGNOSIS — G4730 Sleep apnea, unspecified: Secondary | ICD-10-CM | POA: Diagnosis not present

## 2013-12-30 DIAGNOSIS — F17210 Nicotine dependence, cigarettes, uncomplicated: Secondary | ICD-10-CM | POA: Diagnosis not present

## 2013-12-30 DIAGNOSIS — F329 Major depressive disorder, single episode, unspecified: Secondary | ICD-10-CM | POA: Diagnosis not present

## 2013-12-30 DIAGNOSIS — F419 Anxiety disorder, unspecified: Secondary | ICD-10-CM | POA: Insufficient documentation

## 2013-12-30 DIAGNOSIS — E139 Other specified diabetes mellitus without complications: Secondary | ICD-10-CM

## 2013-12-30 DIAGNOSIS — Z7951 Long term (current) use of inhaled steroids: Secondary | ICD-10-CM | POA: Diagnosis not present

## 2013-12-30 DIAGNOSIS — E119 Type 2 diabetes mellitus without complications: Secondary | ICD-10-CM | POA: Diagnosis present

## 2013-12-30 DIAGNOSIS — Z79899 Other long term (current) drug therapy: Secondary | ICD-10-CM | POA: Diagnosis not present

## 2013-12-30 LAB — POCT GLYCOSYLATED HEMOGLOBIN (HGB A1C): HEMOGLOBIN A1C: 12.3

## 2013-12-30 LAB — GLUCOSE, POCT (MANUAL RESULT ENTRY)
POC GLUCOSE: 378 mg/dL — AB (ref 70–99)
POC Glucose: 348 mg/dl — AB (ref 70–99)

## 2013-12-30 MED ORDER — BENZONATATE 100 MG PO CAPS: 100.0000 mg | ORAL_CAPSULE | Freq: Three times a day (TID) | ORAL | Status: DC | PRN

## 2013-12-30 MED ORDER — INSULIN ASPART 100 UNIT/ML ~~LOC~~ SOLN
20.0000 [IU] | Freq: Once | SUBCUTANEOUS | Status: AC
  Administered 2013-12-30: 20 [IU] via SUBCUTANEOUS

## 2013-12-30 NOTE — Progress Notes (Signed)
MRN: [id number] Name: Tara Keller  Sex: female Age: 48 y.o. DOB: [birth year]  Allergies: Review of patient's allergies indicates no known allergies.  Chief Complaint  Patient presents with  . Follow-up    HPI: Patient is 48 y.o. female who history of diabetes, comes today for followup she brought her the fingerstick glucose log, her fasting sugar is usually more than 200 mg/dL, today his per patient she is already eaten and her blood sugar was more than 300 mg a deciliter, on the last visit she was started on insulin Lantus as per patient currently she is taking 20 units as well as she is taking metformin thousand milligram twice a day, she is also been referred to dietitian, as per patient she cannot get her blood sugar down, trying to modify her diet, she does complain of blurry vision as well as difficulty hearing for several years, she is requesting referral to see a specialist.patient also complaining of minimal productive cough denies any chest pain or shortness of breath, some nasal congestion and postnasal drip. Patient denies any fever.  Past Medical History  Diagnosis Date  . Diabetes mellitus   . Asthma   . Hypertension   . Obesity   . Arthritis   . H/O tubal ligation   . Sleep apnea     uses CPAP  . Anxiety   . Depression   . Fracture closed of upper end of forearm 9 years, Fx left left leg  . Fracture of left lower leg @ 48 years old    Past Surgical History  Procedure Laterality Date  . Cholecystectomy    . Tubal ligation Bilateral   . Colonoscopy with propofol N/A 08/15/2013    Procedure: COLONOSCOPY WITH PROPOFOL;  Surgeon: Jerene Bears, MD;  Location: [facility];  Service: Gastroenterology;  Laterality: N/A;  . Esophagogastroduodenoscopy (egd) with propofol N/A 08/15/2013    Procedure: ESOPHAGOGASTRODUODENOSCOPY (EGD) WITH PROPOFOL;  Surgeon: Jerene Bears, MD;  Location: [facility];  Service: Gastroenterology;  Laterality: N/A;      Medication List       This list is accurate as of: 12/30/13  4:16 PM.  Always use your most recent med list.               albuterol 108 (90 BASE) MCG/ACT inhaler  Commonly known as:  PROVENTIL HFA;VENTOLIN HFA  Inhale 1 puff into the lungs every 6 (six) hours as needed for wheezing or shortness of breath.     benzonatate 100 MG capsule  Commonly known as:  TESSALON  Take 1 capsule (100 mg total) by mouth 3 (three) times daily as needed for cough.     buPROPion 150 MG 24 hr tablet  Commonly known as:  WELLBUTRIN XL  Take 450 mg by mouth every morning.     DULoxetine 60 MG capsule  Commonly known as:  CYMBALTA  Take 120 mg by mouth every morning.     fluticasone 50 MCG/ACT nasal spray  Commonly known as:  FLONASE  Place 2 sprays into both nostrils daily.     Insulin Glargine 100 UNIT/ML Solostar Pen  Commonly known as:  LANTUS SOLOSTAR  Inject 15 Units into the skin at bedtime.     Insulin Syringe-Needle U-100 31G X 5/16" 0.3 ML Misc  Use with solostar pen     metFORMIN 1000 MG tablet  Commonly known as:  GLUCOPHAGE  Take 1 tablet (1,000 mg total) by mouth 2 (two) times daily with a  meal.     metoCLOPramide 5 MG tablet  Commonly known as:  REGLAN  Take 1 tablet (5 mg total) by mouth 4 (four) times daily -  before meals and at bedtime.     NEEDLE (DISP) 24 G 24G X 1" Misc  Commonly known as:  B-D DISP NEEDLE 24GX1"  Use with solostar pen     pantoprazole 40 MG tablet  Commonly known as:  PROTONIX  Take 40 mg by mouth daily.     sucralfate 1 GM/10ML suspension  Commonly known as:  CARAFATE  Take 1 g by mouth 4 (four) times daily.        Meds ordered this encounter  Medications  . insulin aspart (novoLOG) injection 20 Units    Sig:   . benzonatate (TESSALON) 100 MG capsule    Sig: Take 1 capsule (100 mg total) by mouth 3 (three) times daily as needed for cough.    Dispense:  30 capsule    Refill:  1    Immunization History  Administered Date(s) Administered  . Hepatitis  A, Adult 08/22/2013    Family History  Problem Relation Age of Onset  . Diabetes Mother   . Stroke Mother   . Hypertension Mother   . Cancer Sister     History  Substance Use Topics  . Smoking status: Current Every Day Smoker -- 0.25 packs/day    Types: Cigarettes  . Smokeless tobacco: Never Used  . Alcohol Use: No    Review of Systems   As noted in HPI  Filed Vitals:   12/30/13 1542  BP: 145/86  Pulse: 92  Temp: 98.8 F (37.1 C)  Resp: 16    Physical Exam  Physical Exam  Constitutional: No distress.  HENT:  Minimal nasal congestion no sinus tenderness   Eyes: EOM are normal. Pupils are equal, round, and reactive to light.  Neck: Neck supple.  Cardiovascular: Normal rate and regular rhythm.   Pulmonary/Chest: Breath sounds normal. No respiratory distress. She has no wheezes. She has no rales.  Musculoskeletal: She exhibits no edema.    CBC    Component Value Date/Time   WBC 7.6 11/18/2013 1542   RBC 5.28* 11/18/2013 1542   HGB 14.4 11/18/2013 1542   HCT 43.1 11/18/2013 1542   PLT 279 11/18/2013 1542   MCV 81.6 11/18/2013 1542   LYMPHSABS 3.4 11/18/2013 1542   MONOABS 0.6 11/18/2013 1542   EOSABS 0.2 11/18/2013 1542   BASOSABS 0.0 11/18/2013 1542    CMP     Component Value Date/Time   NA 132* 11/20/2013 1458   K 3.6 11/20/2013 1458   CL 94* 11/20/2013 1458   CO2 24 11/20/2013 1458   GLUCOSE 350* 11/20/2013 1458   BUN 5* 11/20/2013 1458   CREATININE 0.7 11/20/2013 1458   CREATININE 0.80 07/31/2012 1648   CALCIUM 9.5 11/20/2013 1458   PROT 7.4 12/19/2013 1539   ALBUMIN 3.4* 12/19/2013 1539   AST 58* 12/19/2013 1539   ALT 69* 12/19/2013 1539   ALKPHOS 212* 12/19/2013 1539   BILITOT 0.4 12/19/2013 1539   GFRNONAA >90 11/18/2013 1542   GFRAA >90 11/18/2013 1542    Lab Results  Component Value Date/Time   CHOL 127 08/15/2012 10:58 AM    No components found for: HGA1C  Lab Results  Component Value Date/Time   AST 58* 12/19/2013 03:39  PM    Assessment and Plan  Other specified diabetes mellitus without complications - Plan:  Diabetes is still uncontrolled ,  hemoglobin A1c is trending up, I have advised patient to increase the dose of Lantus to 30 units each bedtime, continue with metformin, advise for diabetes meal planning, she's also referred to endocrinology. Glucose (CBG), HgB A1c, Ambulatory referral to Endocrinology, insulin aspart (novoLOG) injection 20 Units, Glucose (CBG)  Blurry vision - Plan: Ambulatory referral to Ophthalmology  Hearing reduced, unspecified laterality - Plan: Ambulatory referral to ENT  Cough - Plan:patient has likely viral URI symptoms I have advised patient for saltwater gargles, Flonase for nasal congestion, prescribed benzonatate (TESSALON) 100 MG capsule for cough   Health Maintenance  -Vaccinations:  -patient declines flu shot and pneumovax   Return in about 3 months (around 03/31/2014) for diabetes.  Lorayne Marek, MD

## 2013-12-30 NOTE — Progress Notes (Signed)
Patient here for follow up on her Diabetes Complains her blood sugars have been up and down Also complains of having a cough with yellow phlegm

## 2014-01-06 ENCOUNTER — Encounter: Payer: Medicaid Other | Attending: Internal Medicine

## 2014-01-06 VITALS — Ht 66.0 in | Wt 336.1 lb

## 2014-01-06 DIAGNOSIS — E119 Type 2 diabetes mellitus without complications: Secondary | ICD-10-CM

## 2014-01-06 DIAGNOSIS — Z713 Dietary counseling and surveillance: Secondary | ICD-10-CM | POA: Diagnosis not present

## 2014-01-06 DIAGNOSIS — Z794 Long term (current) use of insulin: Secondary | ICD-10-CM | POA: Insufficient documentation

## 2014-01-06 NOTE — Progress Notes (Signed)

## 2014-01-13 ENCOUNTER — Encounter: Payer: Medicaid Other | Attending: Internal Medicine

## 2014-01-13 DIAGNOSIS — Z794 Long term (current) use of insulin: Secondary | ICD-10-CM | POA: Insufficient documentation

## 2014-01-13 DIAGNOSIS — E119 Type 2 diabetes mellitus without complications: Secondary | ICD-10-CM | POA: Insufficient documentation

## 2014-01-13 DIAGNOSIS — Z713 Dietary counseling and surveillance: Secondary | ICD-10-CM | POA: Insufficient documentation

## 2014-01-13 NOTE — Progress Notes (Signed)

## 2014-01-14 ENCOUNTER — Other Ambulatory Visit: Payer: Self-pay | Admitting: Internal Medicine

## 2014-01-14 ENCOUNTER — Other Ambulatory Visit: Payer: Self-pay | Admitting: *Deleted

## 2014-01-14 DIAGNOSIS — E089 Diabetes mellitus due to underlying condition without complications: Secondary | ICD-10-CM

## 2014-01-14 MED ORDER — METFORMIN HCL 1000 MG PO TABS: 1000.0000 mg | ORAL_TABLET | Freq: Two times a day (BID) | ORAL | Status: DC

## 2014-01-14 NOTE — Telephone Encounter (Signed)
Patient called to request a med refill for metFORMIN (GLUCOPHAGE) 1000 MG tablet, patient's las OV was on 12/30/2013 and patient uses [facility] on [address]. Please f/u with pt.

## 2014-01-14 NOTE — Telephone Encounter (Signed)
Rx Metformin was send to [facility] Pt aware

## 2014-01-20 ENCOUNTER — Telehealth: Payer: Self-pay

## 2014-01-20 ENCOUNTER — Telehealth: Payer: Self-pay | Admitting: Internal Medicine

## 2014-01-20 DIAGNOSIS — E119 Type 2 diabetes mellitus without complications: Secondary | ICD-10-CM

## 2014-01-20 NOTE — Telephone Encounter (Signed)
Patient called stating her blood sugars have been running up and down  And requesting to increase her insulin Explained to patient she would need to follow up here with her primary office And transferred call to make an appointment

## 2014-01-20 NOTE — Telephone Encounter (Signed)
Left voice message to return call 

## 2014-01-20 NOTE — Progress Notes (Signed)
Patient was seen on 01/20/14 for the third of a series of three diabetes self-management courses at the [facility]. The following learning objectives were met by the patient during this class:  . State the amount of activity recommended for healthy living . Describe activities suitable for individual needs . Identify ways to regularly incorporate activity into daily life . Identify barriers to activity and ways to over come these barriers  Identify diabetes medications being personally used and their primary action for lowering glucose and possible side effects . Describe role of stress on blood glucose and develop strategies to address psychosocial issues . Identify diabetes complications and ways to prevent them  Explain how to manage diabetes during illness . Evaluate success in meeting personal goal . Establish 2-3 goals that they will plan to diligently work on until they return for the  23-monthfollow-up visit  Goals:   I will count my carb choices at most meals and snacks  Stick to the plan  I will be active 30 minutes or more 3 times a week  Stay on track  I will take my diabetes medications as scheduled  I will test my glucose at least 3 times a day  I will look at patterns in my record book at least 1 days a month  To help manage stress I will  read at least 1 times a week  Your patient has identified these potential barriers to change:  Motivation   Your patient has identified their diabetes self-care support plan as  Family Support  Plan:  Attend Core 4 in 4 months

## 2014-01-20 NOTE — Telephone Encounter (Signed)
Pt has question regarding DM medication, please f/u with pt.

## 2014-01-28 ENCOUNTER — Encounter: Payer: Self-pay | Admitting: Internal Medicine

## 2014-01-28 ENCOUNTER — Ambulatory Visit: Payer: Medicaid Other | Attending: Internal Medicine | Admitting: Internal Medicine

## 2014-01-28 VITALS — BP 128/78 | HR 72 | Temp 98.0°F | Resp 16 | Wt 335.0 lb

## 2014-01-28 DIAGNOSIS — Z720 Tobacco use: Secondary | ICD-10-CM | POA: Insufficient documentation

## 2014-01-28 DIAGNOSIS — E139 Other specified diabetes mellitus without complications: Secondary | ICD-10-CM

## 2014-01-28 DIAGNOSIS — F17200 Nicotine dependence, unspecified, uncomplicated: Secondary | ICD-10-CM

## 2014-01-28 DIAGNOSIS — Z23 Encounter for immunization: Secondary | ICD-10-CM | POA: Insufficient documentation

## 2014-01-28 DIAGNOSIS — E119 Type 2 diabetes mellitus without complications: Secondary | ICD-10-CM | POA: Diagnosis present

## 2014-01-28 LAB — GLUCOSE, POCT (MANUAL RESULT ENTRY): POC Glucose: 227 mg/dl — AB (ref 70–99)

## 2014-01-28 MED ORDER — INSULIN LISPRO 100 UNIT/ML (KWIKPEN): 3.0000 [IU] | PEN_INJECTOR | Freq: Three times a day (TID) | SUBCUTANEOUS | Status: DC

## 2014-01-28 MED ORDER — INSULIN ASPART 100 UNIT/ML FLEXPEN: 3.0000 [IU] | PEN_INJECTOR | Freq: Three times a day (TID) | SUBCUTANEOUS | Status: DC

## 2014-01-28 NOTE — Progress Notes (Signed)
Patient states she is here for follow up on her diabetes Patient has been keeping a log with her blood sugars and they  Have been running high fasting

## 2014-01-28 NOTE — Patient Instructions (Signed)
Smoking Cessation Quitting smoking is important to your health and has many advantages. However, it is not always easy to quit since nicotine is a very addictive drug. Oftentimes, people try 3 times or more before being able to quit. This document explains the best ways for you to prepare to quit smoking. Quitting takes hard work and a lot of effort, but you can do it. ADVANTAGES OF QUITTING SMOKING  You will live longer, feel better, and live better.  Your body will feel the impact of quitting smoking almost immediately.  Within 20 minutes, blood pressure decreases. Your pulse returns to its normal level.  After 8 hours, carbon monoxide levels in the blood return to normal. Your oxygen level increases.  After 24 hours, the chance of having a heart attack starts to decrease. Your breath, hair, and body stop smelling like smoke.  After 48 hours, damaged nerve endings begin to recover. Your sense of taste and smell improve.  After 72 hours, the body is virtually free of nicotine. Your bronchial tubes relax and breathing becomes easier.  After 2 to 12 weeks, lungs can hold more air. Exercise becomes easier and circulation improves.  The risk of having a heart attack, stroke, cancer, or lung disease is greatly reduced.  After 1 year, the risk of coronary heart disease is cut in half.  After 5 years, the risk of stroke falls to the same as a nonsmoker.  After 10 years, the risk of lung cancer is cut in half and the risk of other cancers decreases significantly.  After 15 years, the risk of coronary heart disease drops, usually to the level of a nonsmoker.  If you are pregnant, quitting smoking will improve your chances of having a healthy baby.  The people you live with, especially any children, will be healthier.  You will have extra money to spend on things other than cigarettes. QUESTIONS TO THINK ABOUT BEFORE ATTEMPTING TO QUIT You may want to talk about your answers with your  health care provider.  Why do you want to quit?  If you tried to quit in the past, what helped and what did not?  What will be the most difficult situations for you after you quit? How will you plan to handle them?  Who can help you through the tough times? Your family? Friends? A health care provider?  What pleasures do you get from smoking? What ways can you still get pleasure if you quit? Here are some questions to ask your health care provider:  How can you help me to be successful at quitting?  What medicine do you think would be best for me and how should I take it?  What should I do if I need more help?  What is smoking withdrawal like? How can I get information on withdrawal? GET READY  Set a quit date.  Change your environment by getting rid of all cigarettes, ashtrays, matches, and lighters in your home, car, or work. Do not let people smoke in your home.  Review your past attempts to quit. Think about what worked and what did not. GET SUPPORT AND ENCOURAGEMENT You have a better chance of being successful if you have help. You can get support in many ways.  Tell your family, friends, and coworkers that you are going to quit and need their support. Ask them not to smoke around you.  Get individual, group, or telephone counseling and support. Programs are available at local hospitals and health centers. Call   your local health department for information about programs in your area.  Spiritual beliefs and practices may help some smokers quit.  Download a "quit meter" on your computer to keep track of quit statistics, such as how long you have gone without smoking, cigarettes not smoked, and money saved.  Get a self-help book about quitting smoking and staying off tobacco. LEARN NEW SKILLS AND BEHAVIORS  Distract yourself from urges to smoke. Talk to someone, go for a walk, or occupy your time with a task.  Change your normal routine. Take a different route to work.  Drink tea instead of coffee. Eat breakfast in a different place.  Reduce your stress. Take a hot bath, exercise, or read a book.  Plan something enjoyable to do every day. Reward yourself for not smoking.  Explore interactive web-based programs that specialize in helping you quit. GET MEDICINE AND USE IT CORRECTLY Medicines can help you stop smoking and decrease the urge to smoke. Combining medicine with the above behavioral methods and support can greatly increase your chances of successfully quitting smoking.  Nicotine replacement therapy helps deliver nicotine to your body without the negative effects and risks of smoking. Nicotine replacement therapy includes nicotine gum, lozenges, inhalers, nasal sprays, and skin patches. Some may be available over-the-counter and others require a prescription.  Antidepressant medicine helps people abstain from smoking, but how this works is unknown. This medicine is available by prescription.  Nicotinic receptor partial agonist medicine simulates the effect of nicotine in your brain. This medicine is available by prescription. Ask your health care provider for advice about which medicines to use and how to use them based on your health history. Your health care provider will tell you what side effects to look out for if you choose to be on a medicine or therapy. Carefully read the information on the package. Do not use any other product containing nicotine while using a nicotine replacement product.  RELAPSE OR DIFFICULT SITUATIONS Most relapses occur within the first 3 months after quitting. Do not be discouraged if you start smoking again. Remember, most people try several times before finally quitting. You may have symptoms of withdrawal because your body is used to nicotine. You may crave cigarettes, be irritable, feel very hungry, cough often, get headaches, or have difficulty concentrating. The withdrawal symptoms are only temporary. They are strongest  when you first quit, but they will go away within 10-14 days. To reduce the chances of relapse, try to:  Avoid drinking alcohol. Drinking lowers your chances of successfully quitting.  Reduce the amount of caffeine you consume. Once you quit smoking, the amount of caffeine in your body increases and can give you symptoms, such as a rapid heartbeat, sweating, and anxiety.  Avoid smokers because they can make you want to smoke.  Do not let weight gain distract you. Many smokers will gain weight when they quit, usually less than 10 pounds. Eat a healthy diet and stay active. You can always lose the weight gained after you quit.  Find ways to improve your mood other than smoking. FOR MORE INFORMATION  www.smokefree.gov  Document Released: 12/20/2000 Document Revised: 05/12/2013 Document Reviewed: 04/06/2011 ExitCare Patient Information 2015 [employer]. This information is not intended to replace advice given to you by your health care provider. Make sure you discuss any questions you have with your health care provider. Diabetes Mellitus and Food It is important for you to manage your blood sugar (glucose) level. Your blood glucose level can be   greatly affected by what you eat. Eating healthier foods in the appropriate amounts throughout the day at about the same time each day will help you control your blood glucose level. It can also help slow or prevent worsening of your diabetes mellitus. Healthy eating may even help you improve the level of your blood pressure and reach or maintain a healthy weight.  HOW CAN FOOD AFFECT ME? Carbohydrates Carbohydrates affect your blood glucose level more than any other type of food. Your dietitian will help you determine how many carbohydrates to eat at each meal and teach you how to count carbohydrates. Counting carbohydrates is important to keep your blood glucose at a healthy level, especially if you are using insulin or taking certain medicines for  diabetes mellitus. Alcohol Alcohol can cause sudden decreases in blood glucose (hypoglycemia), especially if you use insulin or take certain medicines for diabetes mellitus. Hypoglycemia can be a life-threatening condition. Symptoms of hypoglycemia (sleepiness, dizziness, and disorientation) are similar to symptoms of having too much alcohol.  If your health care provider has given you approval to drink alcohol, do so in moderation and use the following guidelines:  Women should not have more than one drink per day, and men should not have more than two drinks per day. One drink is equal to:  12 oz of beer.  5 oz of wine.  1 oz of hard liquor.  Do not drink on an empty stomach.  Keep yourself hydrated. Have water, diet soda, or unsweetened iced tea.  Regular soda, juice, and other mixers might contain a lot of carbohydrates and should be counted. WHAT FOODS ARE NOT RECOMMENDED? As you make food choices, it is important to remember that all foods are not the same. Some foods have fewer nutrients per serving than other foods, even though they might have the same number of calories or carbohydrates. It is difficult to get your body what it needs when you eat foods with fewer nutrients. Examples of foods that you should avoid that are high in calories and carbohydrates but low in nutrients include:  Trans fats (most processed foods list trans fats on the Nutrition Facts label).  Regular soda.  Juice.  Candy.  Sweets, such as cake, pie, doughnuts, and cookies.  Fried foods. WHAT FOODS CAN I EAT? Have nutrient-rich foods, which will nourish your body and keep you healthy. The food you should eat also will depend on several factors, including:  The calories you need.  The medicines you take.  Your weight.  Your blood glucose level.  Your blood pressure level.  Your cholesterol level. You also should eat a variety of foods, including:  Protein, such as meat, poultry, fish,  tofu, nuts, and seeds (lean animal proteins are best).  Fruits.  Vegetables.  Dairy products, such as milk, cheese, and yogurt (low fat is best).  Breads, grains, pasta, cereal, rice, and beans.  Fats such as olive oil, trans fat-free margarine, canola oil, avocado, and olives. DOES EVERYONE WITH DIABETES MELLITUS HAVE THE SAME MEAL PLAN? Because every person with diabetes mellitus is different, there is not one meal plan that works for everyone. It is very important that you meet with a dietitian who will help you create a meal plan that is just right for you. Document Released: 09/22/2004 Document Revised: 12/31/2012 Document Reviewed: 11/22/2012 ExitCare Patient Information 2015 [employer]. This information is not intended to replace advice given to you by your health care provider. Make sure you discuss any questions   you have with your health care provider.  

## 2014-01-28 NOTE — Progress Notes (Signed)
MRN: [id number] Name: Tara Keller  Sex: female Age: 49 y.o. DOB: 1965/08/20  Allergies: Review of patient's allergies indicates no known allergies.  Chief Complaint  Patient presents with  . Hospitalization Follow-up    HPI: Patient is 49 y.o. female who history of diabetes, GERD her, tobacco abuse comes today for followup as per patient she has been taking Lantus 30 units each bedtime as well as metformin thousand milligram twice a day, she has already followed up with the dietitian and is in the process to see endocrinologist but she brought the fingerstick glucose log her fasting sugar has been around 200 mg/dL, her previous sugar level is around 2-300 mg a deciliter, she denies any hypoglycemic symptoms. Patient is to smoke cigarettes, advised patient to quit smoking. Her blood pressure is well controlled.  Past Medical History  Diagnosis Date  . Diabetes mellitus   . Asthma   . Hypertension   . Obesity   . Arthritis   . H/O tubal ligation   . Sleep apnea     uses CPAP  . Anxiety   . Depression   . Fracture closed of upper end of forearm 9 years, Fx left left leg  . Fracture of left lower leg @ 49 years old    Past Surgical History  Procedure Laterality Date  . Cholecystectomy    . Tubal ligation Bilateral   . Colonoscopy with propofol N/A 08/15/2013    Procedure: COLONOSCOPY WITH PROPOFOL;  Surgeon: Jerene Bears, MD;  Location: [facility];  Service: Gastroenterology;  Laterality: N/A;  . Esophagogastroduodenoscopy (egd) with propofol N/A 08/15/2013    Procedure: ESOPHAGOGASTRODUODENOSCOPY (EGD) WITH PROPOFOL;  Surgeon: Jerene Bears, MD;  Location: [facility];  Service: Gastroenterology;  Laterality: N/A;      Medication List       This list is accurate as of: 01/28/14  1:00 PM.  Always use your most recent med list.               albuterol 108 (90 BASE) MCG/ACT inhaler  Commonly known as:  PROVENTIL HFA;VENTOLIN HFA  Inhale 1 puff into the lungs every 6  (six) hours as needed for wheezing or shortness of breath.     benzonatate 100 MG capsule  Commonly known as:  TESSALON  Take 1 capsule (100 mg total) by mouth 3 (three) times daily as needed for cough.     buPROPion 150 MG 24 hr tablet  Commonly known as:  WELLBUTRIN XL  Take 450 mg by mouth every morning.     DULoxetine 60 MG capsule  Commonly known as:  CYMBALTA  Take 120 mg by mouth every morning.     fluticasone 50 MCG/ACT nasal spray  Commonly known as:  FLONASE  Place 2 sprays into both nostrils daily.     Insulin Glargine 100 UNIT/ML Solostar Pen  Commonly known as:  LANTUS SOLOSTAR  Inject 15 Units into the skin at bedtime.     insulin lispro 100 UNIT/ML KiwkPen  Commonly known as:  HUMALOG  Inject 0.03 mLs (3 Units total) into the skin 3 (three) times daily.     Insulin Syringe-Needle U-100 31G X 5/16" 0.3 ML Misc  Use with solostar pen     metFORMIN 1000 MG tablet  Commonly known as:  GLUCOPHAGE  Take 1 tablet (1,000 mg total) by mouth 2 (two) times daily with a meal.     metoCLOPramide 5 MG tablet  Commonly known as:  REGLAN  Take 1 tablet (5 mg total) by mouth 4 (four) times daily -  before meals and at bedtime.     NEEDLE (DISP) 24 G 24G X 1" Misc  Commonly known as:  B-D DISP NEEDLE 24GX1"  Use with solostar pen     pantoprazole 40 MG tablet  Commonly known as:  PROTONIX  Take 40 mg by mouth daily.     sucralfate 1 GM/10ML suspension  Commonly known as:  CARAFATE  Take 1 g by mouth 4 (four) times daily.        Meds ordered this encounter  Medications  . insulin lispro (HUMALOG) 100 UNIT/ML KiwkPen    Sig: Inject 0.03 mLs (3 Units total) into the skin 3 (three) times daily.    Dispense:  15 mL    Refill:  3    Immunization History  Administered Date(s) Administered  . Hepatitis A, Adult 08/22/2013    Family History  Problem Relation Age of Onset  . Diabetes Mother   . Stroke Mother   . Hypertension Mother   . Cancer Sister      History  Substance Use Topics  . Smoking status: Current Every Day Smoker -- 0.25 packs/day    Types: Cigarettes  . Smokeless tobacco: Never Used  . Alcohol Use: No    Review of Systems   As noted in HPI  Filed Vitals:   01/28/14 1209  BP: 128/78  Pulse: 72  Temp: 98 F (36.7 C)  Resp: 16    Physical Exam  Physical Exam  Constitutional: No distress.  Eyes: EOM are normal. Pupils are equal, round, and reactive to light.  Cardiovascular: Normal rate and regular rhythm.   Pulmonary/Chest: Breath sounds normal. No respiratory distress. She has no wheezes. She has no rales.  Musculoskeletal: She exhibits no edema.    CBC    Component Value Date/Time   WBC 7.6 11/18/2013 1542   RBC 5.28* 11/18/2013 1542   HGB 14.4 11/18/2013 1542   HCT 43.1 11/18/2013 1542   PLT 279 11/18/2013 1542   MCV 81.6 11/18/2013 1542   LYMPHSABS 3.4 11/18/2013 1542   MONOABS 0.6 11/18/2013 1542   EOSABS 0.2 11/18/2013 1542   BASOSABS 0.0 11/18/2013 1542    CMP     Component Value Date/Time   NA 132* 11/20/2013 1458   K 3.6 11/20/2013 1458   CL 94* 11/20/2013 1458   CO2 24 11/20/2013 1458   GLUCOSE 350* 11/20/2013 1458   BUN 5* 11/20/2013 1458   CREATININE 0.7 11/20/2013 1458   CREATININE 0.80 07/31/2012 1648   CALCIUM 9.5 11/20/2013 1458   PROT 7.4 12/19/2013 1539   ALBUMIN 3.4* 12/19/2013 1539   AST 58* 12/19/2013 1539   ALT 69* 12/19/2013 1539   ALKPHOS 212* 12/19/2013 1539   BILITOT 0.4 12/19/2013 1539   GFRNONAA >90 11/18/2013 1542   GFRAA >90 11/18/2013 1542    Lab Results  Component Value Date/Time   CHOL 127 08/15/2012 10:58 AM    No components found for: HGA1C  Lab Results  Component Value Date/Time   AST 58* 12/19/2013 03:39 PM    Assessment and Plan  Other specified diabetes mellitus without complications - Plan:  Results for orders placed or performed in visit on 01/28/14  Glucose (CBG)  Result Value Ref Range   POC Glucose 227 (A) 70 - 99 mg/dl    Diabetes is still uncontrolled, her last hemoglobin A1c was told 12.3%, I have advised patient to follow the diet modification  and accommodation, increase the dose of Lantus to 35 units each bedtime, continue with metformin thousand milligram twice a day, I have started patient on mealtime insulin Humalog she'll start with 3 units, will repeat her A1c in 3 months insulin lispro (HUMALOG) 100 UNIT/ML KiwkPen  Smoking And again counseled patient to quit smoking.  Need for prophylactic vaccination against Streptococcus pneumoniae (pneumococcus) Pneumovax given today.   Return in about 3 months (around 04/29/2014) for diabetes.  Lorayne Marek, MD

## 2014-02-03 ENCOUNTER — Encounter: Payer: Self-pay | Admitting: *Deleted

## 2014-02-10 ENCOUNTER — Encounter: Payer: Medicaid Other | Attending: Internal Medicine | Admitting: *Deleted

## 2014-02-10 ENCOUNTER — Encounter: Payer: Self-pay | Admitting: *Deleted

## 2014-02-10 VITALS — Wt 342.4 lb

## 2014-02-10 DIAGNOSIS — E119 Type 2 diabetes mellitus without complications: Secondary | ICD-10-CM | POA: Insufficient documentation

## 2014-02-10 DIAGNOSIS — Z713 Dietary counseling and surveillance: Secondary | ICD-10-CM | POA: Diagnosis not present

## 2014-02-10 DIAGNOSIS — Z794 Long term (current) use of insulin: Secondary | ICD-10-CM | POA: Diagnosis not present

## 2014-02-10 NOTE — Progress Notes (Signed)
Diabetes Self-Management Education  Appt. Start Time: 1445 Appt. End Time: 2831  02/10/2014  [name], identified by name and date of birth, is a 49 y.o. female with a diagnosis of Diabetes: Type 2.  Other people present during visit:  Patient . Patient returns today following [employer] Core Education. She felt she needed 1:1 assistance with meal planning. She seems to be making some good choices. However she likes sweets and has difficulty with portion control. She also noted that her weight has been up and down. She has been "big" since having her children.  ASSESSMENT  Weight 342 lb 6.4 oz (155.312 kg). Body mass index is 55.29 kg/(m^2).    Subsequent Visit Information:  Since your last visit, have you continued or began the use of a meal plan?: Yes Since your last visit, have you continued or began to exercise on a consistent basis?: Yes Since your last visit have you continued or begun to take your medications as prescribed?: Yes Since your last visit have you experienced any weight changes?: Gain Since your last visit, are you checking your blood glucose at least once a day?: Yes  Psychosocial:   Patient Belief/Attitude about Diabetes: Afraid Self-management support: [place] office, CDE visits Other persons present: Patient Patient Concerns: Weight Control, Nutrition/Meal planning Special Needs: None Preferred Learning Style: No preference indicated Learning Readiness: Change in progress  Complications:   How often do you check your blood sugar?: 3-4 times/day Fasting Blood glucose range (mg/dL): 130-179 Postprandial Blood glucose range (mg/dL):  (171-202) Have you had a dilated eye exam in the past 12 months?: No Have you had a dental exam in the past 12 months?: Yes  Diet Intake:  Breakfast: egg, [place] bacon, wheat english muffin Lunch: healthy choice baked fish / stewed beef with potatoes and vegetables Dinner: Macaroni& cheese, cabbagge  Beverage(s):  diet soda, water  Exercise:  Exercise: Light (walking / raking leaves) (walking on treadmill one day then painful the next day) Light Exercise amount of time (min / week): 45   Individualized Plan for Diabetes Self-Management Training:   Learning Objective:  Patient will have a greater understanding of diabetes self-management.  Patient education plan per assessed needs and concerns is to attend individual sessions    Education Topics Reviewed with Patient Today:   Role of diet in the treatment of diabetes and the relationship between the three main macronutrients and blood glucose level, Food label reading, portion sizes and measuring food., Carbohydrate counting, Information on hints to eating out and maintain blood glucose control. Role of exercise on diabetes management, blood pressure control and cardiac health., Helped patient identify appropriate exercises in relation to his/her diabetes, diabetes complications and other health issue.  PATIENTS GOALS/Plan (Developed by the patient):  Nutrition: General guidelines for healthy choices and portions discussed Physical Activity: Exercise 3-5 times per week Medications: take my medication as prescribed   Patient Instructions  Activity: Decrease walking on treadmill to 5 minutes every other day for one week Increase to 5 minutes every day Increase the amount of minutes each day as you can Always have PROTEIN with your carbohydrates  MAKE AN APPOINTMENT FOR AN EYE EXAM  Food: Potato, butter, sour cream, bacon.. Add cheese  Call the office to schedule an appointment with Dietitian to discuss Weight Management  Expected Outcomes:  Demonstrated interest in learning. Expect positive outcomes  Education material provided: Food label handouts and Meal plan card  If problems or questions, patient to contact team via:  Phone  Future DSME appointment: - PRN

## 2014-02-10 NOTE — Patient Instructions (Addendum)
Activity: Decrease walking on treadmill to 5 minutes every other day for one week Increase to 5 minutes every day Increase the amount of minutes each day as you can Always have PROTEIN with your carbohydrates  MAKE AN APPOINTMENT FOR AN EYE EXAM  Food: Potato, butter, sour cream, bacon.. Add cheese  Call the office to schedule an appointment with Dietitian to discuss Weight Management

## 2014-02-16 ENCOUNTER — Encounter: Payer: Self-pay | Admitting: Internal Medicine

## 2014-02-16 ENCOUNTER — Ambulatory Visit (INDEPENDENT_AMBULATORY_CARE_PROVIDER_SITE_OTHER): Payer: Medicaid Other | Admitting: Internal Medicine

## 2014-02-16 ENCOUNTER — Other Ambulatory Visit (INDEPENDENT_AMBULATORY_CARE_PROVIDER_SITE_OTHER): Payer: Medicaid Other

## 2014-02-16 VITALS — BP 140/80 | HR 94 | Ht 66.0 in | Wt 343.0 lb

## 2014-02-16 DIAGNOSIS — R945 Abnormal results of liver function studies: Secondary | ICD-10-CM

## 2014-02-16 DIAGNOSIS — K760 Fatty (change of) liver, not elsewhere classified: Secondary | ICD-10-CM

## 2014-02-16 DIAGNOSIS — R1319 Other dysphagia: Secondary | ICD-10-CM

## 2014-02-16 DIAGNOSIS — R7989 Other specified abnormal findings of blood chemistry: Secondary | ICD-10-CM

## 2014-02-16 DIAGNOSIS — K3184 Gastroparesis: Secondary | ICD-10-CM

## 2014-02-16 DIAGNOSIS — R1310 Dysphagia, unspecified: Secondary | ICD-10-CM

## 2014-02-16 DIAGNOSIS — R1314 Dysphagia, pharyngoesophageal phase: Secondary | ICD-10-CM

## 2014-02-16 DIAGNOSIS — K219 Gastro-esophageal reflux disease without esophagitis: Secondary | ICD-10-CM

## 2014-02-16 LAB — CBC WITH DIFFERENTIAL/PLATELET
BASOS PCT: 0.6 % (ref 0.0–3.0)
Basophils Absolute: 0.1 10*3/uL (ref 0.0–0.1)
Eosinophils Absolute: 0.3 10*3/uL (ref 0.0–0.7)
Eosinophils Relative: 2.5 % (ref 0.0–5.0)
HEMATOCRIT: 38.6 % (ref 36.0–46.0)
Hemoglobin: 12.8 g/dL (ref 12.0–15.0)
LYMPHS ABS: 3.5 10*3/uL (ref 0.7–4.0)
Lymphocytes Relative: 29.2 % (ref 12.0–46.0)
MCHC: 33.3 g/dL (ref 30.0–36.0)
MCV: 82.1 fl (ref 78.0–100.0)
MONO ABS: 0.4 10*3/uL (ref 0.1–1.0)
Monocytes Relative: 3.5 % (ref 3.0–12.0)
NEUTROS ABS: 7.7 10*3/uL (ref 1.4–7.7)
NEUTROS PCT: 64.2 % (ref 43.0–77.0)
Platelets: 316 10*3/uL (ref 150.0–400.0)
RBC: 4.7 Mil/uL (ref 3.87–5.11)
RDW: 13.6 % (ref 11.5–15.5)
WBC: 12 10*3/uL — ABNORMAL HIGH (ref 4.0–10.5)

## 2014-02-16 LAB — COMPREHENSIVE METABOLIC PANEL
ALK PHOS: 199 U/L — AB (ref 39–117)
ALT: 58 U/L — ABNORMAL HIGH (ref 0–35)
AST: 38 U/L — ABNORMAL HIGH (ref 0–37)
Albumin: 3.6 g/dL (ref 3.5–5.2)
BILIRUBIN TOTAL: 0.3 mg/dL (ref 0.2–1.2)
BUN: 6 mg/dL (ref 6–23)
CALCIUM: 9.4 mg/dL (ref 8.4–10.5)
CO2: 30 meq/L (ref 19–32)
CREATININE: 0.66 mg/dL (ref 0.40–1.20)
Chloride: 98 mEq/L (ref 96–112)
GFR: 121.65 mL/min (ref >60.00)
Glucose, Bld: 263 mg/dL — ABNORMAL HIGH (ref 70–99)
Potassium: 4.1 mEq/L (ref 3.5–5.1)
Sodium: 133 mEq/L — ABNORMAL LOW (ref 135–145)
Total Protein: 7.5 g/dL (ref 6.0–8.3)

## 2014-02-16 MED ORDER — METOCLOPRAMIDE HCL 10 MG PO TABS: 10.0000 mg | ORAL_TABLET | Freq: Three times a day (TID) | ORAL | Status: DC

## 2014-02-16 NOTE — Progress Notes (Signed)
Subjective:    Patient ID: Tara Keller, female    DOB: [birth year], 49 y.o.   MRN: [id number]  HPI Lachlan Ruybal is a 48 yo female with PMH of GERD, probable gastroparesis, elevated liver enzymes, fatty liver, H. pylori gastritis status post treatment, adenomatous colon polyps, morbid obesity, sleep apnea, diabetes with A1c of 12, depression who is seen in follow-up. She was seen in November by Tye Savoy, NP to discuss nausea and vomiting. High suspicion of gastroparesis given diabetes in poor control. Reglan 5 mg was started before meals and at bedtime. Liver enzymes are also checked and while she is known to have fatty liver disease she had an acute on chronic elevation in her liver enzymes. Liver enzymes were checked and noted to be improving and it was suspected that she may have had drug-induced liver injury.  Today she follows up stating she continues to struggle with her weight. She has lost weight over the last 6-12 months but she would like to lose more. She reports feeling tired frequently and she does not sleep well. She is not currently using her CPAP. Blood sugars are erratic and often very elevated. She continues to have issues with intermittent nausea but no vomiting. She is having some heartburn despite pantoprazole 40 mg daily. Occasional solid food dysphagia. No odynophagia. Feels upper abdominal bloating and at times early satiety. Tries to avoid fatty foods but has a hard time doing this. Bowel movements have been normal without blood in her stool or melena.   Review of Systems As per history of present illness, otherwise negative  Current Medications, Allergies, Past Medical History, Past Surgical History, Family History and Social History were reviewed in [employer] record.     Objective:   Physical Exam BP 140/80 mmHg  Pulse 94  Ht 5\' 6"  (1.676 m)  Wt 343 lb (155.584 kg)  BMI 55.39 kg/m2  SpO2 96% Constitutional: Well-developed obese female  in no distress. HEENT: Normocephalic and atraumatic. Oropharynx is clear and moist. No oropharyngeal exudate. Conjunctivae are normal.  No scleral icterus. Neck: Neck supple. Trachea midline. Cardiovascular: Normal rate, regular rhythm and intact distal pulses. No M/R/G Pulmonary/chest: Effort normal and breath sounds normal. No wheezing, rales or rhonchi. Abdominal: Soft, obese, diffusely tender without localization and without rebound or guarding, nondistended. Bowel sounds active throughout.  Extremities: no clubbing, cyanosis, trace pretibial edema Lymphadenopathy: No cervical adenopathy noted. Neurological: Alert and oriented to person place and time. Skin: Skin is warm and dry. No rashes noted. Psychiatric: Normal mood and affect. Behavior is normal.  CBC    Component Value Date/Time   WBC 12.0* 02/16/2014 1538   RBC 4.70 02/16/2014 1538   HGB 12.8 02/16/2014 1538   HCT 38.6 02/16/2014 1538   PLT 316.0 02/16/2014 1538   MCV 82.1 02/16/2014 1538   MCH 27.3 11/18/2013 1542   MCHC 33.3 02/16/2014 1538   RDW 13.6 02/16/2014 1538   LYMPHSABS 3.5 02/16/2014 1538   MONOABS 0.4 02/16/2014 1538   EOSABS 0.3 02/16/2014 1538   BASOSABS 0.1 02/16/2014 1538    CMP     Component Value Date/Time   NA 133* 02/16/2014 1538   K 4.1 02/16/2014 1538   CL 98 02/16/2014 1538   CO2 30 02/16/2014 1538   GLUCOSE 263* 02/16/2014 1538   BUN 6 02/16/2014 1538   CREATININE 0.66 02/16/2014 1538   CREATININE 0.80 07/31/2012 1648   CALCIUM 9.4 02/16/2014 1538   PROT 7.5 02/16/2014 1538  ALBUMIN 3.6 02/16/2014 1538   AST 38* 02/16/2014 1538   ALT 58* 02/16/2014 1538   ALKPHOS 199* 02/16/2014 1538   BILITOT 0.3 02/16/2014 1538   GFRNONAA >90 11/18/2013 1542   GFRAA >90 11/18/2013 1542   CLINICAL DATA:  Elevated liver function tests.  ICD10: R 79.89   EXAM: ULTRASOUND ABDOMEN COMPLETE   COMPARISON:  MRI 07/29/2013   FINDINGS: Gallbladder: Surgically absent   Common bile duct:  Diameter: Poorly visualized. Grossly normal at 8 mm.   Liver: Enlarged and increased in echogenicity.   IVC: Not visualized due to patient body habitus and bowel gas.   Pancreas: Not visualized secondary to overlying bowel gas.   Spleen: Size and appearance within normal limits.   Right Kidney: Length: 15.3 cm. Echogenicity within normal limits. No mass or hydronephrosis visualized.   Left Kidney: Length: 15.4 cm. Echogenicity within normal limits. No mass or hydronephrosis visualized.   Abdominal aorta: Partially obscured by overlying bowel gas.   Other findings: No ascites.   IMPRESSION: 1. Hepatomegaly and hepatic steatosis. 2. Moderately degraded exam secondary to patient body habitus and overlying bowel gas.     Electronically Signed   By: Abigail Miyamoto M.D.   On: 11/18/2013 19:08 _____________________________________________________________________________________  EXAM: CT ABDOMEN AND PELVIS WITH CONTRAST   TECHNIQUE: Multidetector CT imaging of the abdomen and pelvis was performed using the standard protocol following bolus administration of intravenous contrast.   CONTRAST:  154mL OMNIPAQUE IOHEXOL 300 MG/ML  SOLN   COMPARISON:  CT of the abdomen and pelvis May 15, 2013 and MRI of the liver July 29, 2013.   FINDINGS: Large body habitus results in overall noisy image quality.   LUNG BASES: Included view of the lung bases demonstrate 4 mm LEFT lower lobe sub solid pulmonary nodule, below size surveillance recommendations. Visualized heart and pericardium are unremarkable.   SOLID ORGANS: The spleen, pancreas and adrenal glands are unremarkable. Status post cholecystectomy. Extremely hypodense, mildly heterogeneous liver and hepatomegaly, liver is 26 cm in craniocaudad dimension.   GASTROINTESTINAL TRACT: The stomach, small and large bowel are normal in course and caliber without inflammatory changes. Enteric contrast has not yet reached the distal small  bowel. Normal appendix.   KIDNEYS/ URINARY TRACT: Kidneys are orthotopic, demonstrating symmetric enhancement. Bilateral, RIGHT greater than LEFT cortical scarring. 11 mm hypodensity cyst in lower pole of RIGHT kidney. No nephrolithiasis, hydronephrosis or solid renal masses. The unopacified ureters are normal in course and caliber. Delayed imaging through the kidneys demonstrates symmetric prompt contrast excretion within the proximal urinary collecting system. Urinary bladder is partially distended and unremarkable.   PERITONEUM/RETROPERITONEUM: No intraperitoneal free fluid nor free air. Aortoiliac vessels are normal in course and caliber. No lymphadenopathy by CT size criteria. Internal reproductive organs are unchanged; sub serosal 2.8 cm fundal leiomyoma.   SOFT TISSUE/OSSEOUS STRUCTURES: Nonsuspicious. Rectus abdominus diastases. Small fat containing umbilical hernia. Surgical clips along the anterior abdominal wall.   IMPRESSION: No acute intra-abdominal or pelvic process on this habitus limited examination.   Re demonstration of severe hepatic steatosis, hepatomegaly and possible hepatic fibrosis.     Electronically Signed   By: Elon Alas   On: 11/18/2013 22:10      Assessment & Plan:  49 yo female with PMH of GERD, probable gastroparesis, elevated liver enzymes, fatty liver, H. pylori gastritis status post treatment, adenomatous colon polyps, morbid obesity, sleep apnea, diabetes with A1c of 12, depression who is seen in follow-up.   1. Early satiety/nausea/abd bloating --  symptoms are consistent with gastroparesis in the setting of uncontrolled diabetes mellitus. Some response to Reglan at low dose though symptoms remain. I stressed the importance of tight glucose control and gastroparesis diet. She has had cross-sectional imaging recently with no overt luminal pathology an upper endoscopy performed within the past year. She did have H. pylori which has been  treated and follow-up stool antigen negative indicating successful eradication. Will increase Reglan to 10 mg before meals and at bedtime. Follow-up for this issue in 8 weeks, sooner if necessary.  2. Elevated liver enzymes/fatty liver -- liver enzymes have improved today and are modestly elevated likely related to fatty liver. Previously significant elevation in transaminases felt secondary to amoxicillin. This has resolved. We had a fatty liver discussion today including the risk of ongoing inflammation leading to cirrhosis. Weight loss through diet and exercise is the only definitive treatment for fatty liver disease. See #3  3. Morbid obesity -- discussion today regarding possible surgical options. She is very interested. I will refer her to Dr. Hassell Done with bariatric surgery. She understands that this will require education and testing prior to consultation. She has multiple medical comorbidities directly related to her weight including not limited to GERD, diabetes, gastroparesis as a result of diabetes, obstructive sleep apnea, lower back pain, and fatty liver. I think she would benefit greatly medically from weight reduction surgery should she qualify.  4. Heartburn with dysphagia -- worsened by morbid obesity. No evidence of Barrett's esophagus on endoscopy within the past year. Continue pantoprazole 40 mg once daily. Reglan at higher dose may help in this regard. Barium swallow with tablet to rule out stricture.  Greater than 40 min spend today and least 50% of which was counseling regarding the above medical issues

## 2014-02-16 NOTE — Patient Instructions (Addendum)
We have sent the following medications to your pharmacy for you to pick up at your convenience: Reglan 10 mg-before meals and at bedtime.  Your physician has requested that you go to the basement for the following lab work before leaving today: CBC, CMET  Please contact [phone] regarding classes to discuss possible weight loss surgery.  You have been scheduled at [facility] with Dr Marygrace Drought on Friday 03/27/14 @ 2:45 pm Their information is as follows: Address: [address], [place], [place] [address]  [phone]  Please follow up with Dr Hilarie Fredrickson on Wednesday, 05/06/14 @ 11:00 am  CC:Dr Advani  Gastroparesis  Gastroparesis is also called slowed stomach emptying (delayed gastric emptying). It is a condition in which the stomach takes too long to empty its contents. It often happens in people with diabetes.  CAUSES  Gastroparesis happens when nerves to the stomach are damaged or stop working. When the nerves are damaged, the muscles of the stomach and intestines do not work normally. The movement of food is slowed or stopped. High blood glucose (sugar) causes changes in nerves and can damage the blood vessels that carry oxygen and nutrients to the nerves. RISK FACTORS  Diabetes.  Post-viral syndromes.  Eating disorders (anorexia, bulimia).  Surgery on the stomach or vagus nerve.  Gastroesophageal reflux disease (rarely).  Smooth muscle disorders (amyloidosis, scleroderma).  Metabolic disorders, including hypothyroidism.  Parkinson disease. SYMPTOMS   Heartburn.  Feeling sick to your stomach (nausea).  Vomiting of undigested food.  An early feeling of fullness when eating.  Weight loss.  Abdominal bloating.  Erratic blood glucose levels.  Lack of appetite.  Gastroesophageal reflux.  Spasms of the stomach wall. Complications can include:  Bacterial overgrowth in stomach. Food stays in the stomach and can ferment and cause bacteria to  grow.  Weight loss due to difficulty digesting and absorbing nutrients.  Vomiting.  Obstruction in the stomach. Undigested food can harden and cause nausea and vomiting.  Blood glucose fluctuations caused by inconsistent food absorption. DIAGNOSIS  The diagnosis of gastroparesis is confirmed through one or more of the following tests:  Barium X-rays and scans. These tests look at how long it takes for food to move through the stomach.  Gastric manometry. This test measures electrical and muscular activity in the stomach. A thin tube is passed down the throat into the stomach. The tube contains a wire that takes measurements of the stomach's electrical and muscular activity as it digests liquids and solid food.  Endoscopy. This procedure is done with a long, thin tube called an endoscope. It is passed through the mouth and gently down the esophagus into the stomach. This tube helps the caregiver look at the lining of the stomach to check for any abnormalities.  Ultrasonography. This can rule out gallbladder disease or pancreatitis. This test will outline and define the shape of the gallbladder and pancreas. TREATMENT   Treatments may include:  Exercise.  Medicines to control nausea and vomiting.  Medicines to stimulate stomach muscles.  Changes in what and when you eat.  Having smaller meals more often.  Eating low-fiber forms of high-fiber foods, such as eating cooked vegetables instead of raw vegetables.  Eating low-fat foods.  Consuming liquids, which are easier to digest.  In severe cases, feeding tubes and intravenous (IV) feeding may be needed. It is important to note that in most cases, treatment does not cure gastroparesis. It is usually a lasting (chronic) condition. Treatment helps you manage the underlying  condition so that you can be as healthy and comfortable as possible. Other treatments  A gastric neurostimulator has been developed to assist people with  gastroparesis. The battery-operated device is surgically implanted. It emits mild electrical pulses to help improve stomach emptying and to control nausea and vomiting.  The use of botulinum toxin has been shown to improve stomach emptying by decreasing the prolonged contractions of the muscle between the stomach and the small intestine (pyloric sphincter). The benefits are temporary. SEEK MEDICAL CARE IF:   You have diabetes and you are having problems keeping your blood glucose in goal range.  You are having nausea, vomiting, bloating, or early feelings of fullness with eating.  Your symptoms do not change with a change in diet. Document Released: 12/26/2004 Document Revised: 04/22/2012 Document Reviewed: 06/04/2008 [facility] Patient Information 2015 [place], [place]. This information is not intended to replace advice given to you by your health care provider. Make sure you discuss any questions you have with your health care provider.

## 2014-02-17 ENCOUNTER — Telehealth: Payer: Self-pay | Admitting: *Deleted

## 2014-02-17 DIAGNOSIS — R1310 Dysphagia, unspecified: Secondary | ICD-10-CM

## 2014-02-17 DIAGNOSIS — R1319 Other dysphagia: Secondary | ICD-10-CM

## 2014-02-17 NOTE — Telephone Encounter (Signed)
Patient has been scheduled for barium esophagram with tablet at [facility] on 02/24/14 (Tue) at 11:00 am. I have left a message for patient to call back.

## 2014-02-17 NOTE — Telephone Encounter (Signed)
-----   Message from Jerene Bears, MD sent at 02/16/2014  5:34 PM EST ----- Needs barium swallow with tablet, I forgot to tell you Esophageal dysphagia

## 2014-02-17 NOTE — Telephone Encounter (Signed)
I have spoken to patient and have given time/date/location and prep instructions for barium esophagram. She verbalizes understanding.

## 2014-02-24 ENCOUNTER — Ambulatory Visit (HOSPITAL_COMMUNITY)
Admission: RE | Admit: 2014-02-24 | Discharge: 2014-02-24 | Disposition: A | Discharge status: Home / Self Care | Payer: Medicaid Other | Source: Ambulatory Visit | Attending: Internal Medicine | Admitting: Internal Medicine

## 2014-02-24 ENCOUNTER — Ambulatory Visit (INDEPENDENT_AMBULATORY_CARE_PROVIDER_SITE_OTHER): Payer: Medicaid Other | Admitting: Internal Medicine

## 2014-02-24 ENCOUNTER — Telehealth: Payer: Self-pay | Admitting: Internal Medicine

## 2014-02-24 DIAGNOSIS — K760 Fatty (change of) liver, not elsewhere classified: Secondary | ICD-10-CM | POA: Diagnosis not present

## 2014-02-24 DIAGNOSIS — Z23 Encounter for immunization: Secondary | ICD-10-CM

## 2014-02-24 DIAGNOSIS — K219 Gastro-esophageal reflux disease without esophagitis: Secondary | ICD-10-CM | POA: Insufficient documentation

## 2014-02-24 DIAGNOSIS — R1310 Dysphagia, unspecified: Secondary | ICD-10-CM | POA: Diagnosis present

## 2014-02-24 DIAGNOSIS — R1319 Other dysphagia: Secondary | ICD-10-CM

## 2014-02-24 NOTE — Telephone Encounter (Signed)
Pt calling to report that lately when she has been taking insulin she has started to breakout and would like to speak to PCP/clinician.  Please f/u with pt with instructions.

## 2014-02-25 ENCOUNTER — Telehealth: Payer: Self-pay | Admitting: Internal Medicine

## 2014-02-25 ENCOUNTER — Ambulatory Visit: Payer: Medicaid Other | Attending: Internal Medicine | Admitting: Physician Assistant

## 2014-02-25 VITALS — BP 113/73 | HR 99 | Temp 97.8°F | Resp 16 | Ht 66.0 in | Wt 336.8 lb

## 2014-02-25 DIAGNOSIS — Z794 Long term (current) use of insulin: Secondary | ICD-10-CM | POA: Insufficient documentation

## 2014-02-25 DIAGNOSIS — R21 Rash and other nonspecific skin eruption: Secondary | ICD-10-CM

## 2014-02-25 DIAGNOSIS — Z7951 Long term (current) use of inhaled steroids: Secondary | ICD-10-CM | POA: Diagnosis not present

## 2014-02-25 DIAGNOSIS — Z79899 Other long term (current) drug therapy: Secondary | ICD-10-CM | POA: Diagnosis not present

## 2014-02-25 DIAGNOSIS — E119 Type 2 diabetes mellitus without complications: Secondary | ICD-10-CM

## 2014-02-25 LAB — GLUCOSE, POCT (MANUAL RESULT ENTRY): POC GLUCOSE: 246 mg/dL — AB (ref 70–99)

## 2014-02-25 MED ORDER — HYDROCORTISONE 0.5 % EX CREA: 1.0000 "application " | TOPICAL_CREAM | Freq: Two times a day (BID) | CUTANEOUS | Status: DC

## 2014-02-25 MED ORDER — HYDROXYZINE PAMOATE 25 MG PO CAPS: 100.0000 mg | ORAL_CAPSULE | Freq: Three times a day (TID) | ORAL | Status: DC | PRN

## 2014-02-25 NOTE — Telephone Encounter (Signed)
Patient called stating that she recently was prescribed a different insulin and now has a rash all over her body. Patient would like to get a different medication and speak to a nurse. Facility gave the patient a same day appointment for a walk-in. Please f/u

## 2014-02-25 NOTE — Progress Notes (Signed)
Pt presents to clinic with c/o back and vaginal rash, thinks as a result of new insulin Novolog.

## 2014-02-25 NOTE — Progress Notes (Signed)
Chief Complaint:  rash  Subjective:  49 year old female diabetic recently seen by Dr. Annitta Needs  With some adjustments made to her insulin regimen. She was started on Humalog 3 units with meals. She states that since that time she's noticed a rash in her upper back. It is itchy. No drainage. No other symptoms.   ROS:  GEN: denies fever or chills, denies change in weight Skin: + lesions or rashes HEENT: denies headache, earache, epistaxis, sore throat, or neck pain LUNGS: denies SHOB, dyspnea, PND, orthopnea CV: denies CP or palpitations   Objective:  Filed Vitals:   02/25/14 1538  BP: 113/73  Pulse: 99  Temp: 97.8 F (36.6 C)  TempSrc: Oral  Resp: 16  Height: 5\' 6"  (1.676 m)  Weight: 336 lb 12.8 oz (152.771 kg)  SpO2: 98%    Physical Exam:  General: in no acute distress. Skin: upper back with flesh-colored pustules,  No drainage HEENT: no pallor, no icterus, moist oral mucosa, no JVD, no lymphadenopathy Heart: Normal  s1 &s2  Regular rate and rhythm, without murmurs, rubs, gallops. Lungs: Clear to auscultation bilaterally.   Pertinent Lab Results:blood glucose 246   Medications: Prior to Admission medications   Medication Sig Start Date End Date Taking? Authorizing Provider  buPROPion (WELLBUTRIN XL) 150 MG 24 hr tablet Take 450 mg by mouth every morning.    Historical Provider, MD  DULoxetine (CYMBALTA) 60 MG capsule Take 120 mg by mouth every morning.     Historical Provider, MD  fluticasone (FLONASE) 50 MCG/ACT nasal spray Place 2 sprays into both nostrils daily. 11/26/13   Lorayne Marek, MD  insulin aspart (NOVOLOG) 100 UNIT/ML FlexPen Inject 3 Units into the skin 3 (three) times daily with meals. 01/28/14   Lorayne Marek, MD  Insulin Glargine (LANTUS SOLOSTAR) 100 UNIT/ML Solostar Pen Inject 15 Units into the skin at bedtime. 11/28/13   Lorayne Marek, MD  Insulin Syringe-Needle U-100 31G X 5/16" 0.3 ML MISC Use with solostar pen 11/26/13   Lorayne Marek, MD   metFORMIN (GLUCOPHAGE) 1000 MG tablet Take 1 tablet (1,000 mg total) by mouth 2 (two) times daily with a meal. 01/14/14   Lorayne Marek, MD  metoCLOPramide (REGLAN) 10 MG tablet Take 1 tablet (10 mg total) by mouth 4 (four) times daily -  before meals and at bedtime. 02/16/14   Jerene Bears, MD  NEEDLE, DISP, 24 G (B-D DISP NEEDLE 24GX1") 24G X 1" MISC Use with solostar pen 11/26/13   Lorayne Marek, MD  pantoprazole (PROTONIX) 40 MG tablet Take 40 mg by mouth daily.    Historical Provider, MD  sucralfate (CARAFATE) 1 GM/10ML suspension Take 1 g by mouth 4 (four) times daily.    Historical Provider, MD    Assessment: 1.  Rash, unclear etiology   Plan:  continue current  Diabetic regimen  Atarax as needed  Hydrocortisone cream as needed  Follow up: 4 weeks with Dr. Annitta Needs  The patient was given clear instructions to go to ER or return to medical center if symptoms don't improve, worsen or new problems develop. The patient verbalized understanding. The patient was told to call to get lab results if they haven't heard anything in the next week.   This note has been created with Surveyor, quantity. Any transcriptional errors are unintentional.   Zettie Pho, PA-C 02/25/2014, 4:10 PM

## 2014-03-02 ENCOUNTER — Encounter: Payer: Medicaid Other | Admitting: Dietician

## 2014-03-02 VITALS — Ht 66.0 in | Wt 344.3 lb

## 2014-03-02 DIAGNOSIS — E119 Type 2 diabetes mellitus without complications: Secondary | ICD-10-CM

## 2014-03-02 NOTE — Progress Notes (Signed)
  Medical Nutrition Therapy:  Appt start time: 5300 end time:  1230.  Assessment:  Primary concerns t identified by name and date of birth, is a 49 y.o. female with a diagnosis of Diabetes:   Has previously gone through Core classes and met with Ulyses Jarred, RN 1:1. Would like to lose weight. Weight was 374 lbs in October and felt like she "could not eat" for awhile which is getting better with medications. Was diagnosed with gastroparesis and is being followed by a gasteroenterologist. Trying to limit fatty foods. Also feels like esophagus closes up on her which scares her and make her not want to eat. Weight has fluctuating lately.  Has been using her treadmill for 5 minutes per day and drinking a lot of water. Some days she is really hungry.  Testing blood sugar 3 x day average 150  - 285 mg/dl which is better than it had been (near 500 mg/dl).  Not buying chips, does not like eggs anymore, and not eating much cheese. Eats 1-3 meals per day. Eats out about 1 x month.   Feels like portions are big, though they are smaller then before.   Would like to lose 120 lbs.   Preferred Learning Style:   No preference indicated   Learning Readiness:   Ready  MEDICATIONS:insulin, metformin   DIETARY INTAKE:  Usual eating pattern includes 1-3 meals and 0-1 snacks per day.  Avoided foods include cauliflower, asparagus, squash, chips, eggs  24-hr recall:  B ( AM): cereal (Raisin Bran) with 1% milk and water  Snk ( AM): none  L ( PM): frozen meal or tyson nuggets  Snk ( PM): none or yogurt  D ( PM): frozen meal or bologna sandwich  Snk ( PM): depends - cereal or bologna sandwich  Beverages: water, crystal light lemonade, minute maid light 15 calories  Usual physical activity: 5 minutes on treadmill each day  Estimated energy needs: 1600 calories 180 g carbohydrates 120 g protein 44 g fat  Progress Towards Goal(s):  In progress.   Nutritional Diagnosis:  NB-1.1 Food and  nutrition-related knowledge deficit As related to obesity, diabetes, and gastroparesis .  As evidenced by BMI of 55.6, elevated blood sugar, and chronic feelings of fullness .    Intervention:  Nutrition counseling provided.  Plan: Eat small meals/snacks every 3-5 hours you are awake. Have a protein and carb every time you eat [facility], yogurt, peanut butter and jelly on wheat bread, fruit with nuts or cheese or meats) Add some vegetables to meals.  Use small plates to help with portions.  Continue to avoid high fat and high sugar foods. Continue exercise each day and increase when possible.   Teaching Method Utilized:  Visual Auditory Hands on  Handouts given during visit include:  Yellow Card  MyPlate Handout  15 g CHO Snacks  Barriers to learning/adherence to lifestyle change: limited income, multiple chronic health conditions  Demonstrated degree of understanding via:  Teach Back   Monitoring/Evaluation:  Dietary intake, exercise, and body weight in 1 month(s).

## 2014-03-02 NOTE — Patient Instructions (Addendum)
Eat small meals/snacks every 3-5 hours you are awake. Have a protein and carb every time you eat [facility], yogurt, peanut butter and jelly on wheat bread, fruit with nuts or cheese or meats) Add some vegetables to meals.  Use small plates to help with portions.  Continue to avoid high fat and high sugar foods. Continue exercise each day and increase when possible.

## 2014-03-04 ENCOUNTER — Other Ambulatory Visit: Payer: Self-pay

## 2014-03-04 DIAGNOSIS — K219 Gastro-esophageal reflux disease without esophagitis: Secondary | ICD-10-CM

## 2014-03-06 ENCOUNTER — Telehealth: Payer: Self-pay | Admitting: Emergency Medicine

## 2014-03-06 ENCOUNTER — Telehealth: Payer: Self-pay | Admitting: Internal Medicine

## 2014-03-06 DIAGNOSIS — E139 Other specified diabetes mellitus without complications: Secondary | ICD-10-CM

## 2014-03-06 MED ORDER — "INSULIN SYRINGE-NEEDLE U-100 31G X 5/16"" 0.3 ML MISC": Status: DC

## 2014-03-06 NOTE — Telephone Encounter (Signed)
Pt called requesting medication refill on Insulin Syringe-Needle U-100 31G X 5/16" 0.3 ML MISC . Please f/u with pt

## 2014-03-06 NOTE — Telephone Encounter (Signed)
Expand All Collapse All   Pt called requesting medication refill on Insulin Syringe-Needle U-100 31G X 5/16" 0.3 ML MISC . Please f/u with pt        Insulin needles reordered; e-scribed to pharmacy

## 2014-03-06 NOTE — Telephone Encounter (Signed)
Left message insulin needles reordered; e-scribed to [facility]

## 2014-03-11 ENCOUNTER — Telehealth: Payer: Self-pay | Admitting: Internal Medicine

## 2014-03-11 NOTE — Telephone Encounter (Signed)
Patient has called in today to get some clarification on her medications; patient has been given new script and doesn't know if it will affect her insulin or not; please f/u with patient about he inquiries

## 2014-03-13 ENCOUNTER — Telehealth: Payer: Self-pay | Admitting: Emergency Medicine

## 2014-03-13 ENCOUNTER — Telehealth: Payer: Self-pay | Admitting: Internal Medicine

## 2014-03-13 DIAGNOSIS — E139 Other specified diabetes mellitus without complications: Secondary | ICD-10-CM

## 2014-03-13 MED ORDER — "INSULIN SYRINGE-NEEDLE U-100 31G X 5/16"" 0.3 ML MISC": Status: DC

## 2014-03-13 MED ORDER — "NEEDLE (DISP) 24G X 1"" MISC": Status: DC

## 2014-03-13 NOTE — Telephone Encounter (Signed)
Patient called to request a med refill for her pen needles, patient also would like to speak to nurse about refills. Please f/u with pt.

## 2014-03-13 NOTE — Telephone Encounter (Signed)
Pt

## 2014-03-13 NOTE — Telephone Encounter (Signed)
Spoke with pt, insulin needles reordered; e-scribed to [facility] pharmacy

## 2014-03-20 ENCOUNTER — Encounter: Payer: Self-pay | Admitting: Internal Medicine

## 2014-03-20 ENCOUNTER — Ambulatory Visit: Payer: Medicaid Other | Attending: Internal Medicine | Admitting: Internal Medicine

## 2014-03-20 VITALS — BP 114/72 | HR 72 | Temp 98.0°F | Resp 16 | Wt 337.2 lb

## 2014-03-20 DIAGNOSIS — E119 Type 2 diabetes mellitus without complications: Secondary | ICD-10-CM | POA: Diagnosis not present

## 2014-03-20 DIAGNOSIS — G4700 Insomnia, unspecified: Secondary | ICD-10-CM | POA: Insufficient documentation

## 2014-03-20 DIAGNOSIS — Z1231 Encounter for screening mammogram for malignant neoplasm of breast: Secondary | ICD-10-CM

## 2014-03-20 DIAGNOSIS — Z794 Long term (current) use of insulin: Secondary | ICD-10-CM | POA: Insufficient documentation

## 2014-03-20 DIAGNOSIS — E139 Other specified diabetes mellitus without complications: Secondary | ICD-10-CM

## 2014-03-20 LAB — GLUCOSE, POCT (MANUAL RESULT ENTRY): POC Glucose: 216 mg/dl — AB (ref 70–99)

## 2014-03-20 NOTE — Progress Notes (Signed)
Patient here for follow up on her diabetes and to discuss Her medications with doctor

## 2014-03-20 NOTE — Progress Notes (Signed)
MRN: [id number] Name: Tara Keller  Sex: female Age: 49 y.o. DOB: Mar 21, 1965  Allergies: Review of patient's allergies indicates no known allergies.  Chief Complaint  Patient presents with  . Follow-up    HPI: Patient is 49 y.o. female who has history of diabetes hypertension, anxiety/insomnia, as per patient she is taking Lantus 30 units each bedtime as well as Humalog 3 units with meals, she brought the fingerstick log her fasting blood sugars yearly more than 200 mg/dL, on the last visit she was advised to take 35 units of Lantus but she has not been taking the recommended dose, today her major concern was insomnia as per patient she was seen by her psychiatrist and was started on Klonopin which she has not started yet, she had a concern that it might cause her gain weight, I have counseled patient for sleep hygiene and only take the medication as needed.  Past Medical History  Diagnosis Date  . Diabetes mellitus   . Asthma   . Hypertension   . Obesity   . Arthritis   . H/O tubal ligation   . Sleep apnea     uses CPAP  . Anxiety   . Depression   . Fracture closed of upper end of forearm 9 years, Fx left left leg  . Fracture of left lower leg @ 49 years old  . Fatty liver   . Positive H. pylori test   . Tubular adenoma of colon     Past Surgical History  Procedure Laterality Date  . Cholecystectomy    . Tubal ligation Bilateral   . Colonoscopy with propofol N/A 08/15/2013    Procedure: COLONOSCOPY WITH PROPOFOL;  Surgeon: Jerene Bears, MD;  Location: [facility];  Service: Gastroenterology;  Laterality: N/A;  . Esophagogastroduodenoscopy (egd) with propofol N/A 08/15/2013    Procedure: ESOPHAGOGASTRODUODENOSCOPY (EGD) WITH PROPOFOL;  Surgeon: Jerene Bears, MD;  Location: [facility];  Service: Gastroenterology;  Laterality: N/A;      Medication List       This list is accurate as of: 03/20/14 11:52 AM.  Always use your most recent med list.               buPROPion 150 MG 24 hr tablet  Commonly known as:  WELLBUTRIN XL  Take 450 mg by mouth every morning.     clonazePAM 1 MG tablet  Commonly known as:  KLONOPIN  Take 1 mg by mouth at bedtime.     DULoxetine 60 MG capsule  Commonly known as:  CYMBALTA  Take 120 mg by mouth every morning.     fluticasone 50 MCG/ACT nasal spray  Commonly known as:  FLONASE  Place 2 sprays into both nostrils daily.     hydrocortisone cream 0.5 %  Apply 1 application topically 2 (two) times daily.     hydrOXYzine 25 MG capsule  Commonly known as:  VISTARIL  Take 4 capsules (100 mg total) by mouth 3 (three) times daily as needed for itching.     insulin aspart 100 UNIT/ML FlexPen  Commonly known as:  NOVOLOG  Inject 3 Units into the skin 3 (three) times daily with meals.     Insulin Glargine 100 UNIT/ML Solostar Pen  Commonly known as:  LANTUS SOLOSTAR  Inject 15 Units into the skin at bedtime.     Insulin Syringe-Needle U-100 31G X 5/16" 0.3 ML Misc  Use with solostar pen to inject 4 times daily     metFORMIN  1000 MG tablet  Commonly known as:  GLUCOPHAGE  Take 1 tablet (1,000 mg total) by mouth 2 (two) times daily with a meal.     metoCLOPramide 10 MG tablet  Commonly known as:  REGLAN  Take 1 tablet (10 mg total) by mouth 4 (four) times daily -  before meals and at bedtime.     NEEDLE (DISP) 24 G 24G X 1" Misc  Commonly known as:  B-D DISP NEEDLE 24GX1"  Use with solostar pen to inject 4 times daily     pantoprazole 40 MG tablet  Commonly known as:  PROTONIX  Take 40 mg by mouth daily.     sucralfate 1 GM/10ML suspension  Commonly known as:  CARAFATE  Take 1 g by mouth 4 (four) times daily.        Meds ordered this encounter  Medications  . clonazePAM (KLONOPIN) 1 MG tablet    Sig: Take 1 mg by mouth at bedtime.    Immunization History  Administered Date(s) Administered  . Hepatitis A, Adult 08/22/2013, 02/24/2014  . Pneumococcal Polysaccharide-23 01/28/2014    Family  History  Problem Relation Age of Onset  . Diabetes Mother   . Stroke Mother   . Hypertension Mother   . Cancer Sister     History  Substance Use Topics  . Smoking status: Current Every Day Smoker -- 0.25 packs/day    Types: Cigarettes  . Smokeless tobacco: Never Used  . Alcohol Use: No    Review of Systems   As noted in HPI  Filed Vitals:   03/20/14 1104  BP: 114/72  Pulse: 72  Temp: 98 F (36.7 C)  Resp: 16    Physical Exam  Physical Exam  Constitutional:  Obese female sitting comfortably not in acute distress  Eyes: EOM are normal. Pupils are equal, round, and reactive to light.  Cardiovascular: Normal rate and regular rhythm.   Pulmonary/Chest: Breath sounds normal. No respiratory distress. She has no wheezes. She has no rales.  Musculoskeletal: She exhibits no edema.    CBC    Component Value Date/Time   WBC 12.0* 02/16/2014 1538   RBC 4.70 02/16/2014 1538   HGB 12.8 02/16/2014 1538   HCT 38.6 02/16/2014 1538   PLT 316.0 02/16/2014 1538   MCV 82.1 02/16/2014 1538   LYMPHSABS 3.5 02/16/2014 1538   MONOABS 0.4 02/16/2014 1538   EOSABS 0.3 02/16/2014 1538   BASOSABS 0.1 02/16/2014 1538    CMP     Component Value Date/Time   NA 133* 02/16/2014 1538   K 4.1 02/16/2014 1538   CL 98 02/16/2014 1538   CO2 30 02/16/2014 1538   GLUCOSE 263* 02/16/2014 1538   BUN 6 02/16/2014 1538   CREATININE 0.66 02/16/2014 1538   CREATININE 0.80 07/31/2012 1648   CALCIUM 9.4 02/16/2014 1538   PROT 7.5 02/16/2014 1538   ALBUMIN 3.6 02/16/2014 1538   AST 38* 02/16/2014 1538   ALT 58* 02/16/2014 1538   ALKPHOS 199* 02/16/2014 1538   BILITOT 0.3 02/16/2014 1538   GFRNONAA >90 11/18/2013 1542   GFRAA >90 11/18/2013 1542    Lab Results  Component Value Date/Time   CHOL 127 08/15/2012 10:58 AM    No components found for: HGA1C  Lab Results  Component Value Date/Time   AST 38* 02/16/2014 03:38 PM    Assessment and Plan  Other specified diabetes mellitus  without complications - Plan:  Results for orders placed or performed in visit on 03/20/14  Glucose (  CBG)  Result Value Ref Range   POC Glucose 216 (A) 70 - 99 mg/dl   Patient will increase the dose of Lantus to 35 units, also increase the Humalog to 4 units 3 times with each meal, advise for diabetes planning, she was also referred to dietitian, will repeat her A1c in 3 months.  Encounter for screening mammogram for breast cancer - Plan: MM DIGITAL SCREENING BILATERAL  Insomnia Have advised patient for sleep hygiene, patient following up with psych and has Klonopin to use when necessary.  Health Maintenance  -Mammogram: ordered -Vaccinations:  Patient declined flu shot, up-to-date with her Pneumovax.  Return in about 3 months (around 06/20/2014) for diabetes.   This note has been created with Surveyor, quantity. Any transcriptional errors are unintentional.    Lorayne Marek, MD

## 2014-04-01 ENCOUNTER — Encounter: Payer: Medicaid Other | Attending: Internal Medicine | Admitting: Dietician

## 2014-04-01 VITALS — Wt 341.3 lb

## 2014-04-01 DIAGNOSIS — Z794 Long term (current) use of insulin: Secondary | ICD-10-CM | POA: Diagnosis not present

## 2014-04-01 DIAGNOSIS — E119 Type 2 diabetes mellitus without complications: Secondary | ICD-10-CM | POA: Diagnosis present

## 2014-04-01 DIAGNOSIS — Z713 Dietary counseling and surveillance: Secondary | ICD-10-CM | POA: Insufficient documentation

## 2014-04-01 NOTE — Patient Instructions (Addendum)
Eat small meals/snacks every 3-5 hours you are awake. Have a protein and carb every time you eat [facility] [employer], yogurt, peanut butter and jelly on wheat bread, fruit with nuts or cheese or meats) Portion nuts/snacks out in your containers. Add some vegetables to meals - eat more of these if you are still hungry.  Use small plates to help with portions.  Continue to avoid high fat and high sugar foods. Plan to walk 3 x week for 30 minutes.  Choose grilled chicken (without breading). Plan to have ice cream 1 x week. (portion out in 1/2 carb container). Have it instead of other carb at dinner. Eat meals at the table with no TV on. Try to take 20 minutes to eat - chew well, put your fork down, drink water.  Use a cereal bowl for popcorn.

## 2014-04-01 NOTE — Progress Notes (Signed)
  Medical Nutrition Therapy:  Appt start time: 6767 end time:  1215.  Assessment:  Primary concerns t identified by name and date of birth, is a 49 y.o. female with a diagnosis of Diabetes: Lost 3 lbs since previous visit. Starting taking some medication for sleep but is not sure if she will continue. Trying to stay away from sweets which is hard.   Stopped using the treadmill since she was feeling depressed. Had trouble with some of her kids. Feeling frustrated about her weight.   Testing blood sugar 3 x day average 130-300 mg/dl which is better than it had been (near 500 mg/dl).  Bought some containers to help measure portions of food group.   Would like to lose 120 lbs.   Preferred Learning Style:   No preference indicated   Learning Readiness:   Ready  MEDICATIONS:insulin, metformin   DIETARY INTAKE:  Usual eating pattern includes 1-3 meals and 0-1 snacks per day.  Avoided foods include cauliflower, asparagus, squash, chips, eggs  24-hr recall:  B ( AM): cereal (Raisin Bran) with 1% milk and water  Snk ( AM): none  L ( PM): frozen meal or tyson nuggets  Snk ( PM): none or yogurt  D ( PM): frozen meal or bologna sandwich  Snk ( PM): depends - cereal or bologna sandwich  Beverages: water, crystal light lemonade, minute maid light 15 calories  Usual physical activity: 5 minutes on treadmill each day  Estimated energy needs: 1600 calories 180 g carbohydrates 120 g protein 44 g fat  Progress Towards Goal(s):  In progress.   Nutritional Diagnosis:  NB-1.1 Food and nutrition-related knowledge deficit As related to obesity, diabetes, and gastroparesis .  As evidenced by BMI of 55.6, elevated blood sugar, and chronic feelings of fullness .    Intervention:  Nutrition counseling provided.  Eat small meals/snacks every 3-5 hours you are awake. Have a protein and carb every time you eat [facility] [employer], yogurt, peanut butter and jelly on wheat bread, fruit with  nuts or cheese or meats) Portion nuts/snacks out in your containers. Add some vegetables to meals - eat more of these if you are still hungry.  Use small plates to help with portions.  Continue to avoid high fat and high sugar foods. Plan to walk 3 x week for 30 minutes.  Choose grilled chicken (without breading). Plan to have ice cream 1 x week. (portion out in 1/2 carb container). Have it instead of other carb at dinner. Eat meals at the table with no TV on. Try to take 20 minutes to eat - chew well, put your fork down, drink water.  Use a cereal bowl for popcorn.   Teaching Method Utilized:  Visual Auditory Hands on  Barriers to learning/adherence to lifestyle change: limited income, multiple chronic health conditions  Demonstrated degree of understanding via:  Teach Back   Monitoring/Evaluation:  Dietary intake, exercise, and body weight in 2 month(s).

## 2014-04-09 ENCOUNTER — Telehealth: Payer: Self-pay | Admitting: Emergency Medicine

## 2014-05-04 ENCOUNTER — Encounter (HOSPITAL_COMMUNITY): Payer: Self-pay | Admitting: *Deleted

## 2014-05-06 ENCOUNTER — Ambulatory Visit (INDEPENDENT_AMBULATORY_CARE_PROVIDER_SITE_OTHER): Payer: Medicaid Other | Admitting: Internal Medicine

## 2014-05-06 ENCOUNTER — Encounter: Payer: Self-pay | Admitting: Internal Medicine

## 2014-05-06 VITALS — BP 132/84 | HR 92 | Ht 66.5 in | Wt 344.4 lb

## 2014-05-06 DIAGNOSIS — R933 Abnormal findings on diagnostic imaging of other parts of digestive tract: Secondary | ICD-10-CM

## 2014-05-06 DIAGNOSIS — E1143 Type 2 diabetes mellitus with diabetic autonomic (poly)neuropathy: Secondary | ICD-10-CM

## 2014-05-06 DIAGNOSIS — Z86010 Personal history of colonic polyps: Secondary | ICD-10-CM

## 2014-05-06 DIAGNOSIS — K219 Gastro-esophageal reflux disease without esophagitis: Secondary | ICD-10-CM

## 2014-05-06 DIAGNOSIS — K760 Fatty (change of) liver, not elsewhere classified: Secondary | ICD-10-CM

## 2014-05-06 DIAGNOSIS — K3184 Gastroparesis: Secondary | ICD-10-CM

## 2014-05-06 MED ORDER — METOCLOPRAMIDE HCL 10 MG PO TABS: 10.0000 mg | ORAL_TABLET | Freq: Three times a day (TID) | ORAL | Status: DC

## 2014-05-06 NOTE — Patient Instructions (Addendum)
You have been given a separate informational sheet regarding your tobacco use, the importance of quitting and local resources to help you quit.  The contact number to go to class prior to getting a consult is [phone]. Just tell the operator that you need to sign up for the bariatric seminar.  We have sent the following medications to your pharmacy for you to pick up at your convenience: Reglan 10 mg three times daily before meals and at night.  Keep endoscopy appointment.

## 2014-05-06 NOTE — Progress Notes (Signed)
Subjective:    Patient ID: Tara Keller, female    DOB: [birth year], 49 y.o.   MRN: [id number]  HPI Tara Keller is a 49 year old female with a past history of GERD, probable gastroparesis, elevated liver enzymes and fatty liver, H. pylori gastritis status post treatment, adenomatous colon polyps, morbid obesity, sleep apnea, diabetes, and depression who seen in follow-up. She was last seen in February 2018. At that time she had had some improvement with low-dose Reglan in early satiety, nausea and abdominal bloating. Reglan was increased to 10 mg before meals and at bedtime. She was continued on pantoprazole 40 mg daily for GERD and barium swallow was ordered. Barium swallow was performed and showed marked esophageal reflux with an outpouching of the GE junction. Upper endoscopy is recommended. Has been scheduled in the hospital setting for 05/12/2014 7:30. She reports good control of heartburn symptoms now with pantoprazole 40 mg daily. Her early satiety, bloating and nausea have improved dramatically with Reglan 10 mg. She is using this before meals and at bedtime. She wishes to continue this medicine.   Review of Systems As per HPI, otherwise negative  Current Medications, Allergies, Past Medical History, Past Surgical History, Family History and Social History were reviewed in [employer] record.     Objective:   Physical Exam BP 132/84 mmHg  Pulse 92  Ht 5' 6.5" (1.689 m)  Wt 344 lb 6 oz (156.207 kg)  BMI 54.76 kg/m2 Constitutional: Well-developed and well-nourished. No distress. HEENT: Normocephalic and atraumatic.Conjunctivae are normal.  No scleral icterus, sclera muddy. Neck: Neck supple. Trachea midline. Cardiovascular: Normal rate, regular rhythm and intact distal pulses. No M/R/G Pulmonary/chest: Effort normal and breath sounds normal. No wheezing, rales or rhonchi. Abdominal: Soft, obese, nontender, nondistended. Bowel sounds active throughout.    Extremities: no clubbing, cyanosis, or edema Neurological: Alert and oriented to person place and time. Psychiatric: Normal mood and affect. Behavior is normal.  EXAM: ESOPHOGRAM / BARIUM SWALLOW / BARIUM TABLET STUDY   TECHNIQUE: Combined double contrast and single contrast examination performed using effervescent crystals, thick barium liquid, and thin barium liquid. The patient was observed with fluoroscopy swallowing a 55mm barium sulphate tablet.   FLUOROSCOPY TIME:  1 minutes 42 seconds   COMPARISON:  CT 11/18/2013   FINDINGS: Exam is somewhat limited in sensitivity due to patient body habitus which limited patient's motility as well as imaging technique.   There is no swallowing dysfunction not cervical esophagus. No mucosal irregularity evident within the thoracic esophagus or distal esophagus. No stricture or mass.   There is normal esophageal motility.   With the patient and supine position marked gas esophageal reflux was noted which extend high into the cervical esophagus.   Small hiatal hernia a versus a distal duodenum diverticulum is noted. Ulceration less favored. This is found at the GE junction.   A 13 mm barium tab passed the GE junction easily.   IMPRESSION: 1. Marked esophageal reflux which extends high into the esophagus. 2. No evidence of esophageal stricture or ulceration. 3. Outpouching at the GE junction may represent esophageal diverticulum or small hiatal hernia. Ulceration is felt less likely. Recommend upper endoscopy.     Electronically Signed   By: Suzy Bouchard M.D.   On: 02/24/2014 13:27     Assessment & Plan:  49 year old female with a past history of GERD, probable gastroparesis, elevated liver enzymes and fatty liver, H. pylori gastritis status post treatment, adenomatous colon polyps, morbid obesity, sleep apnea,  diabetes, and depression who seen in follow-up.  1. GERD/hiatal hernia/abnl barium swallow -- continue  pantoprazole 40 mg daily. Proceed with endoscopy as scheduled next week. We reviewed the risks, benefits and alternatives to this test and she understands and agrees to proceed  2. Gastroparesis in the setting of uncontrolled diabetes -- good response to Reglan. We reviewed the rare but possible neurologic complications of Reglan therapy. She understands these risks but given the benefit of this medicine for her she wishes to continue. Will continue 10 mg 3 times a day before meals and at bedtime. Ultimately weight loss, and better diabetes control will likely dramatically improved this condition and likely eliminate the need for medication  3. Morbid obesity -- we had previously discussed gastric bypass. She is not yet been to the education classes and she is again given the phone number. She is strongly encouraged to pursue this given her obesity and multiple obesity-related medical problems.  4. Fatty liver -- likely nonalcoholic steatohepatitis. Again weight loss is the best therapy for this condition, see #3. Monitor liver enzymes routinely. We did discuss the risk of fibrosis and cirrhosis if chronic liver inflammation remains present  5. History of colon polyps -- 2 colon polyps in 2015, repeat colonoscopy recommended 2020

## 2014-05-11 ENCOUNTER — Encounter (HOSPITAL_COMMUNITY): Payer: Self-pay | Admitting: Anesthesiology

## 2014-05-11 NOTE — Anesthesia Preprocedure Evaluation (Signed)
Anesthesia Evaluation  Patient identified by MRN, date of birth, ID band Patient awake    Reviewed: Allergy & Precautions, NPO status , Patient's Chart, lab work & pertinent test results  Airway Mallampati: II  TM Distance: >3 FB Neck ROM: Full    Dental no notable dental hx.    Pulmonary asthma , sleep apnea , Current Smoker,  breath sounds clear to auscultation  Pulmonary exam normal       Cardiovascular hypertension, Rhythm:Regular Rate:Normal     Neuro/Psych PSYCHIATRIC DISORDERS Anxiety Depression negative neurological ROS     GI/Hepatic Neg liver ROS, GERD-  Medicated,  Endo/Other  diabetes, Type 2, Oral Hypoglycemic Agents, Insulin DependentMorbid obesity  Renal/GU negative Renal ROS  negative genitourinary   Musculoskeletal  (+) Arthritis -,   Abdominal (+) + obese,   Peds negative pediatric ROS (+)  Hematology negative hematology ROS (+)   Anesthesia Other Findings   Reproductive/Obstetrics negative OB ROS                             Anesthesia Physical Anesthesia Plan  ASA: III  Anesthesia Plan: MAC   Post-op Pain Management:    Induction: Intravenous  Airway Management Planned:   Additional Equipment:   Intra-op Plan:   Post-operative Plan:   Informed Consent: I have reviewed the patients History and Physical, chart, labs and discussed the procedure including the risks, benefits and alternatives for the proposed anesthesia with the patient or authorized representative who has indicated his/her understanding and acceptance.   Dental advisory given  Plan Discussed with: CRNA  Anesthesia Plan Comments:         Anesthesia Quick Evaluation

## 2014-05-12 ENCOUNTER — Encounter (HOSPITAL_COMMUNITY): Payer: Self-pay

## 2014-05-12 ENCOUNTER — Ambulatory Visit (HOSPITAL_COMMUNITY)
Admission: RE | Admit: 2014-05-12 | Discharge: 2014-05-12 | Disposition: A | Discharge status: Home / Self Care | Payer: Medicaid Other | Source: Ambulatory Visit | Attending: Internal Medicine | Admitting: Internal Medicine

## 2014-05-12 ENCOUNTER — Ambulatory Visit (HOSPITAL_COMMUNITY): Payer: Medicaid Other | Admitting: Anesthesiology

## 2014-05-12 ENCOUNTER — Encounter (HOSPITAL_COMMUNITY)
Admission: RE | Disposition: A | Discharge status: Home / Self Care | Payer: Self-pay | Source: Ambulatory Visit | Attending: Internal Medicine

## 2014-05-12 DIAGNOSIS — Z6841 Body Mass Index (BMI) 40.0 and over, adult: Secondary | ICD-10-CM | POA: Insufficient documentation

## 2014-05-12 DIAGNOSIS — K3184 Gastroparesis: Secondary | ICD-10-CM | POA: Diagnosis not present

## 2014-05-12 DIAGNOSIS — K210 Gastro-esophageal reflux disease with esophagitis: Secondary | ICD-10-CM | POA: Insufficient documentation

## 2014-05-12 DIAGNOSIS — F17200 Nicotine dependence, unspecified, uncomplicated: Secondary | ICD-10-CM | POA: Diagnosis not present

## 2014-05-12 DIAGNOSIS — Z86010 Personal history of colonic polyps: Secondary | ICD-10-CM | POA: Diagnosis not present

## 2014-05-12 DIAGNOSIS — F419 Anxiety disorder, unspecified: Secondary | ICD-10-CM | POA: Insufficient documentation

## 2014-05-12 DIAGNOSIS — Z79899 Other long term (current) drug therapy: Secondary | ICD-10-CM | POA: Diagnosis not present

## 2014-05-12 DIAGNOSIS — K449 Diaphragmatic hernia without obstruction or gangrene: Secondary | ICD-10-CM | POA: Insufficient documentation

## 2014-05-12 DIAGNOSIS — G4730 Sleep apnea, unspecified: Secondary | ICD-10-CM | POA: Diagnosis not present

## 2014-05-12 DIAGNOSIS — M1990 Unspecified osteoarthritis, unspecified site: Secondary | ICD-10-CM | POA: Insufficient documentation

## 2014-05-12 DIAGNOSIS — K219 Gastro-esophageal reflux disease without esophagitis: Secondary | ICD-10-CM | POA: Insufficient documentation

## 2014-05-12 DIAGNOSIS — Z794 Long term (current) use of insulin: Secondary | ICD-10-CM | POA: Insufficient documentation

## 2014-05-12 DIAGNOSIS — R945 Abnormal results of liver function studies: Secondary | ICD-10-CM | POA: Insufficient documentation

## 2014-05-12 DIAGNOSIS — J45909 Unspecified asthma, uncomplicated: Secondary | ICD-10-CM | POA: Diagnosis not present

## 2014-05-12 DIAGNOSIS — I10 Essential (primary) hypertension: Secondary | ICD-10-CM | POA: Diagnosis not present

## 2014-05-12 DIAGNOSIS — E1165 Type 2 diabetes mellitus with hyperglycemia: Secondary | ICD-10-CM | POA: Diagnosis not present

## 2014-05-12 DIAGNOSIS — R933 Abnormal findings on diagnostic imaging of other parts of digestive tract: Secondary | ICD-10-CM | POA: Insufficient documentation

## 2014-05-12 DIAGNOSIS — F329 Major depressive disorder, single episode, unspecified: Secondary | ICD-10-CM | POA: Insufficient documentation

## 2014-05-12 DIAGNOSIS — K2950 Unspecified chronic gastritis without bleeding: Secondary | ICD-10-CM | POA: Diagnosis not present

## 2014-05-12 DIAGNOSIS — K760 Fatty (change of) liver, not elsewhere classified: Secondary | ICD-10-CM | POA: Diagnosis not present

## 2014-05-12 HISTORY — PX: ESOPHAGOGASTRODUODENOSCOPY: SHX5428

## 2014-05-12 HISTORY — DX: Diaphragmatic hernia without obstruction or gangrene: K44.9

## 2014-05-12 LAB — GLUCOSE, CAPILLARY: GLUCOSE-CAPILLARY: 225 mg/dL — AB (ref 70–99)

## 2014-05-12 SURGERY — EGD (ESOPHAGOGASTRODUODENOSCOPY)
Anesthesia: Monitor Anesthesia Care

## 2014-05-12 MED ORDER — PROPOFOL 10 MG/ML IV BOLUS
INTRAVENOUS | Status: DC | PRN
  Administered 2014-05-12: 40 mg via INTRAVENOUS
  Administered 2014-05-12 (×4): 20 mg via INTRAVENOUS

## 2014-05-12 MED ORDER — LIDOCAINE HCL (CARDIAC) 20 MG/ML IV SOLN
INTRAVENOUS | Status: AC
  Filled 2014-05-12: qty 5

## 2014-05-12 MED ORDER — LIDOCAINE HCL (CARDIAC) 20 MG/ML IV SOLN
INTRAVENOUS | Status: DC | PRN
  Administered 2014-05-12: 100 mg via INTRAVENOUS

## 2014-05-12 MED ORDER — ONDANSETRON HCL 4 MG/2ML IJ SOLN
INTRAMUSCULAR | Status: DC | PRN
  Administered 2014-05-12: 4 mg via INTRAVENOUS

## 2014-05-12 MED ORDER — LACTATED RINGERS IV SOLN
INTRAVENOUS | Status: DC
  Administered 2014-05-12: 1000 mL via INTRAVENOUS

## 2014-05-12 MED ORDER — PROPOFOL 10 MG/ML IV BOLUS
INTRAVENOUS | Status: AC
  Filled 2014-05-12: qty 20

## 2014-05-12 MED ORDER — ONDANSETRON HCL 4 MG/2ML IJ SOLN
INTRAMUSCULAR | Status: AC
  Filled 2014-05-12: qty 2

## 2014-05-12 MED ORDER — SODIUM CHLORIDE 0.9 % IJ SOLN
INTRAMUSCULAR | Status: AC
  Filled 2014-05-12: qty 10

## 2014-05-12 MED ORDER — SODIUM CHLORIDE 0.9 % IV SOLN
INTRAVENOUS | Status: DC
Start: 2014-05-12 — End: 2014-05-12

## 2014-05-12 MED ORDER — PROPOFOL INFUSION 10 MG/ML OPTIME
INTRAVENOUS | Status: DC | PRN
  Administered 2014-05-12: 180 ug/kg/min via INTRAVENOUS

## 2014-05-12 MED ORDER — EPHEDRINE SULFATE 50 MG/ML IJ SOLN
INTRAMUSCULAR | Status: AC
  Filled 2014-05-12: qty 1

## 2014-05-12 MED ORDER — PANTOPRAZOLE SODIUM 40 MG PO TBEC: 40.0000 mg | DELAYED_RELEASE_TABLET | Freq: Two times a day (BID) | ORAL | Status: DC

## 2014-05-12 NOTE — Discharge Instructions (Signed)
YOU HAD AN ENDOSCOPIC PROCEDURE TODAY: Refer to the procedure report that was given to you for any specific questions about what was found during the examination.  If the procedure report does not answer your questions, please call your gastroenterologist to clarify. ° °YOU SHOULD EXPECT: Some feelings of bloating in the abdomen. Passage of more gas than usual.  Walking can help get rid of the air that was put into your GI tract during the procedure and reduce the bloating. If you had a lower endoscopy (such as a colonoscopy or flexible sigmoidoscopy) you may notice spotting of blood in your stool or on the toilet paper.  ° °DIET: Your first meal following the procedure should be a light meal and then it is ok to progress to your normal diet.  A half-sandwich or bowl of soup is an example of a good first meal.  Heavy or fried foods are harder to digest and may make you feel nasueas or bloated.  Drink plenty of fluids but you should avoid alcoholic beverages for 24 hours. ° °ACTIVITY: Your care partner should take you home directly after the procedure.  You should plan to take it easy, moving slowly for the rest of the day.  You can resume normal activity the day after the procedure however you should NOT DRIVE or use heavy machinery for 24 hours (because of the sedation medicines used during the test).   ° °SYMPTOMS TO REPORT IMMEDIATELY  °A gastroenterologist can be reached at any hour.  Please call your doctor's office for any of the following symptoms: ° °· Following lower endoscopy (colonoscopy, flexible sigmoidoscopy) ° Excessive amounts of blood in the stool ° Significant tenderness, worsening of abdominal pains ° Swelling of the abdomen that is new, acute ° Fever of 100° or higher °· Following upper endoscopy (EGD, EUS, ERCP) ° Vomiting of blood or coffee ground material ° New, significant abdominal pain ° New, significant chest pain or pain under the shoulder blades ° Painful or persistently difficult  swallowing ° New shortness of breath ° Black, tarry-looking stools ° °FOLLOW UP: °If any biopsies were taken you will be contacted by phone or by letter within the next 1-3 weeks.  Call your gastroenterologist if you have not heard about the biopsies in 3 weeks.  °Please also call your gastroenterologist's office with any specific questions about appointments or follow up tests. ° ° °Esophagogastroduodenoscopy °Care After °Refer to this sheet in the next few weeks. These instructions provide you with information on caring for yourself after your procedure. Your caregiver may also give you more specific instructions. Your treatment has been planned according to current medical practices, but problems sometimes occur. Call your caregiver if you have any problems or questions after your procedure.  °HOME CARE INSTRUCTIONS °· Do not eat or drink anything until the numbing medicine (local anesthetic) has worn off and your gag reflex has returned. You will know that the local anesthetic has worn off when you can swallow comfortably. °· Do not drive for 12 hours after the procedure or as directed by your caregiver. °· Only take medicines as directed by your caregiver. °SEEK MEDICAL CARE IF:  °· You cannot stop coughing. °· You are not urinating at all or less than usual. °SEEK IMMEDIATE MEDICAL CARE IF: °· You have difficulty swallowing. °· You cannot eat or drink. °· You have worsening throat or chest pain. °· You have dizziness, lightheadedness, or you faint. °· You have nausea or vomiting. °· You have   chills. °· You have a fever. °· You have severe abdominal pain. °· You have black, tarry, or bloody stools. °Document Released: 12/13/2011 Document Reviewed: 12/13/2011 °ExitCare® Patient Information ©2015 [employer]. This information is not intended to replace advice given to you by your health care provider. Make sure you discuss any questions you have with your health care provider. ° ° °Conscious Sedation, Adult,  Care After °Refer to this sheet in the next few weeks. These instructions provide you with information on caring for yourself after your procedure. Your health care provider may also give you more specific instructions. Your treatment has been planned according to current medical practices, but problems sometimes occur. Call your health care provider if you have any problems or questions after your procedure. °WHAT TO EXPECT AFTER THE PROCEDURE  °After your procedure: °· You may feel sleepy, clumsy, and have poor balance for several hours. °· Vomiting may occur if you eat too soon after the procedure. °HOME CARE INSTRUCTIONS °· Do not participate in any activities where you could become injured for at least 24 hours. Do not: °¨ Drive. °¨ Swim. °¨ Ride a bicycle. °¨ Operate heavy machinery. °¨ Cook. °¨ Use power tools. °¨ Climb ladders. °¨ Work from a high place. °· Do not make important decisions or sign legal documents until you are improved. °· If you vomit, drink water, juice, or soup when you can drink without vomiting. Make sure you have little or no nausea before eating solid foods. °· Only take over-the-counter or prescription medicines for pain, discomfort, or fever as directed by your health care provider. °· Make sure you and your family fully understand everything about the medicines given to you, including what side effects may occur. °· You should not drink alcohol, take sleeping pills, or take medicines that cause drowsiness for at least 24 hours. °· If you smoke, do not smoke without supervision. °· If you are feeling better, you may resume normal activities 24 hours after you were sedated. °· Keep all appointments with your health care provider. °SEEK MEDICAL CARE IF: °· Your skin is pale or bluish in color. °· You continue to feel nauseous or vomit. °· Your pain is getting worse and is not helped by medicine. °· You have bleeding or swelling. °· You are still sleepy or feeling clumsy after 24  hours. °SEEK IMMEDIATE MEDICAL CARE IF: °· You develop a rash. °· You have difficulty breathing. °· You develop any type of allergic problem. °· You have a fever. °MAKE SURE YOU: °· Understand these instructions. °· Will watch your condition. °· Will get help right away if you are not doing well or get worse. °Document Released: 10/16/2012 Document Reviewed: 10/16/2012 °ExitCare® Patient Information ©2015 [employer]. This information is not intended to replace advice given to you by your health care provider. Make sure you discuss any questions you have with your health care provider. ° °

## 2014-05-12 NOTE — Anesthesia Postprocedure Evaluation (Signed)
  Anesthesia Post-op Note  Patient: Tara Keller  Procedure(s) Performed: Procedure(s) (LRB): ESOPHAGOGASTRODUODENOSCOPY (EGD) (N/A)  Patient Location: PACU  Anesthesia Type: MAC  Level of Consciousness: awake and alert   Airway and Oxygen Therapy: Patient Spontanous Breathing  Post-op Pain: mild  Post-op Assessment: Post-op Vital signs reviewed, Patient's Cardiovascular Status Stable, Respiratory Function Stable, Patent Airway and No signs of Nausea or vomiting  Last Vitals:  Filed Vitals:   05/12/14 0830  BP: 129/71  Pulse: 91  Temp:   Resp: 17    Post-op Vital Signs: stable   Complications: No apparent anesthesia complications

## 2014-05-12 NOTE — H&P (View-Only) (Signed)
Subjective:    Patient ID: Tara Keller, female    DOB: 09/25/1965, 49 y.o.   MRN: [id number]  HPI Tara Keller is a 49 year old female with a past history of GERD, probable gastroparesis, elevated liver enzymes and fatty liver, H. pylori gastritis status post treatment, adenomatous colon polyps, morbid obesity, sleep apnea, diabetes, and depression who seen in follow-up. She was last seen in February 2018. At that time she had had some improvement with low-dose Reglan in early satiety, nausea and abdominal bloating. Reglan was increased to 10 mg before meals and at bedtime. She was continued on pantoprazole 40 mg daily for GERD and barium swallow was ordered. Barium swallow was performed and showed marked esophageal reflux with an outpouching of the GE junction. Upper endoscopy is recommended. Has been scheduled in the hospital setting for 05/12/2014 7:30. She reports good control of heartburn symptoms now with pantoprazole 40 mg daily. Her early satiety, bloating and nausea have improved dramatically with Reglan 10 mg. She is using this before meals and at bedtime. She wishes to continue this medicine.   Review of Systems As per HPI, otherwise negative  Current Medications, Allergies, Past Medical History, Past Surgical History, Family History and Social History were reviewed in [employer] record.     Objective:   Physical Exam BP 132/84 mmHg  Pulse 92  Ht 5' 6.5" (1.689 m)  Wt 344 lb 6 oz (156.207 kg)  BMI 54.76 kg/m2 Constitutional: Well-developed and well-nourished. No distress. HEENT: Normocephalic and atraumatic.Conjunctivae are normal.  No scleral icterus, sclera muddy. Neck: Neck supple. Trachea midline. Cardiovascular: Normal rate, regular rhythm and intact distal pulses. No M/R/G Pulmonary/chest: Effort normal and breath sounds normal. No wheezing, rales or rhonchi. Abdominal: Soft, obese, nontender, nondistended. Bowel sounds active throughout.    Extremities: no clubbing, cyanosis, or edema Neurological: Alert and oriented to person place and time. Psychiatric: Normal mood and affect. Behavior is normal.  EXAM: ESOPHOGRAM / BARIUM SWALLOW / BARIUM TABLET STUDY   TECHNIQUE: Combined double contrast and single contrast examination performed using effervescent crystals, thick barium liquid, and thin barium liquid. The patient was observed with fluoroscopy swallowing a 40mm barium sulphate tablet.   FLUOROSCOPY TIME:  1 minutes 42 seconds   COMPARISON:  CT 11/18/2013   FINDINGS: Exam is somewhat limited in sensitivity due to patient body habitus which limited patient's motility as well as imaging technique.   There is no swallowing dysfunction not cervical esophagus. No mucosal irregularity evident within the thoracic esophagus or distal esophagus. No stricture or mass.   There is normal esophageal motility.   With the patient and supine position marked gas esophageal reflux was noted which extend high into the cervical esophagus.   Small hiatal hernia a versus a distal duodenum diverticulum is noted. Ulceration less favored. This is found at the GE junction.   A 13 mm barium tab passed the GE junction easily.   IMPRESSION: 1. Marked esophageal reflux which extends high into the esophagus. 2. No evidence of esophageal stricture or ulceration. 3. Outpouching at the GE junction may represent esophageal diverticulum or small hiatal hernia. Ulceration is felt less likely. Recommend upper endoscopy.     Electronically Signed   By: Suzy Bouchard M.D.   On: 02/24/2014 13:27     Assessment & Plan:  49 year old female with a past history of GERD, probable gastroparesis, elevated liver enzymes and fatty liver, H. pylori gastritis status post treatment, adenomatous colon polyps, morbid obesity, sleep apnea,  diabetes, and depression who seen in follow-up.  1. GERD/hiatal hernia/abnl barium swallow -- continue  pantoprazole 40 mg daily. Proceed with endoscopy as scheduled next week. We reviewed the risks, benefits and alternatives to this test and she understands and agrees to proceed  2. Gastroparesis in the setting of uncontrolled diabetes -- good response to Reglan. We reviewed the rare but possible neurologic complications of Reglan therapy. She understands these risks but given the benefit of this medicine for her she wishes to continue. Will continue 10 mg 3 times a day before meals and at bedtime. Ultimately weight loss, and better diabetes control will likely dramatically improved this condition and likely eliminate the need for medication  3. Morbid obesity -- we had previously discussed gastric bypass. She is not yet been to the education classes and she is again given the phone number. She is strongly encouraged to pursue this given her obesity and multiple obesity-related medical problems.  4. Fatty liver -- likely nonalcoholic steatohepatitis. Again weight loss is the best therapy for this condition, see #3. Monitor liver enzymes routinely. We did discuss the risk of fibrosis and cirrhosis if chronic liver inflammation remains present  5. History of colon polyps -- 2 colon polyps in 2015, repeat colonoscopy recommended 2020

## 2014-05-12 NOTE — Transfer of Care (Signed)
Immediate Anesthesia Transfer of Care Note  Patient: Tara Keller  Procedure(s) Performed: Procedure(s): ESOPHAGOGASTRODUODENOSCOPY (EGD) (N/A)  Patient Location: endoscopy  Anesthesia Type:MAC  Level of Consciousness:  sedated, patient cooperative and responds to stimulation  Airway & Oxygen Therapy:Patient Spontanous Breathing and Patient connected to face mask oxgen  Post-op Assessment:  Report given to endoscopy RN and Post -op Vital signs reviewed and stable  Post vital signs:  Reviewed and stable  Last Vitals:  Filed Vitals:   05/12/14 0653  BP: 136/91  Pulse: 96  Temp: 36.9 C  Resp: 20    Complications: No apparent anesthesia complications

## 2014-05-12 NOTE — Op Note (Signed)
[place], [id number]   ENDOSCOPY PROCEDURE REPORT  PATIENT: Tara Keller, Tara Keller  MR#: [id number] BIRTHDATE: 25-Jun-1965 , 48  yrs. old GENDER: female ENDOSCOPIST: Jerene Bears, MD PROCEDURE DATE:  05/12/2014 PROCEDURE:  EGD w/ biopsy ASA CLASS:     Class III INDICATIONS:  abnormal barium esophagram, history of reflux, history of gastroparesis. MEDICATIONS: Monitored anesthesia care and Per Anesthesia TOPICAL ANESTHETIC: Cetacaine Spray  DESCRIPTION OF PROCEDURE: After the risks benefits and alternatives of the procedure were thoroughly explained, informed consent was obtained.  The [place] [id number] endoscope was introduced through the mouth and advanced to the second portion of the duodenum , Without limitations.  The instrument was slowly withdrawn as the mucosa was fully examined.  ESOPHAGUS: There was LA Class C esophagitis (reflux) noted in the distal esophagus.  No diverticulum seen.  No varices.  STOMACH: A 3 cm hiatal hernia was noted.   The mucosa of the stomach appeared normal.  Cold forcep biopsies were taken at the gastric body, antrum and angularis to evaluate for h.  pylori (given history of h. pylori).  DUODENUM: The duodenal mucosa showed no abnormalities in the bulb and 2nd part of the duodenum.  Retroflexed views revealed a hiatal hernia.     The scope was then withdrawn from the patient and the procedure completed.  COMPLICATIONS: There were no immediate complications.  ENDOSCOPIC IMPRESSION: 1.   Moderate to severe reflux esophagitis noted 2.   3 cm hiatal hernia 3.   The mucosa of the stomach appeared normal; biopsies to exclude H. pylori 4.   The duodenal mucosa showed no abnormalities in the bulb and 2nd part of the duodenum  RECOMMENDATIONS: 1.  Increase PPI (pantoprazole 40 mg) to twice daily for at least 8 weeks 2.  Anti-reflux diet and lifestyle modifications 3.  Continue metoclopramide for  gastroparesis 4.  Office follow-up with me  eSigned:  Jerene Bears, MD 05/12/2014 8:00 AM   CC: the patient

## 2014-05-12 NOTE — Interval H&P Note (Signed)
History and Physical Interval Note: For EGD today as discussed recently in clinic. With MAC The nature of the procedure, as well as the risks, benefits, and alternatives were carefully and thoroughly reviewed with the patient. Ample time for discussion and questions allowed. The patient understood, was satisfied, and agreed to proceed.     05/12/2014 7:36 AM  Tara Keller M Hammett  has presented today for surgery, with the diagnosis of Reflux  The various methods of treatment have been discussed with the patient and family. After consideration of risks, benefits and other options for treatment, the patient has consented to  Procedure(s): ESOPHAGOGASTRODUODENOSCOPY (EGD) (N/A) as a surgical intervention .  The patient's history has been reviewed, patient examined, no change in status, stable for surgery.  I have reviewed the patient's chart and labs.  Questions were answered to the patient's satisfaction.     [name]

## 2014-05-13 ENCOUNTER — Encounter (HOSPITAL_COMMUNITY): Payer: Self-pay | Admitting: Internal Medicine

## 2014-05-15 ENCOUNTER — Encounter: Payer: Self-pay | Admitting: Internal Medicine

## 2014-05-16 ENCOUNTER — Encounter (HOSPITAL_COMMUNITY): Payer: Self-pay | Admitting: Emergency Medicine

## 2014-05-16 ENCOUNTER — Emergency Department (INDEPENDENT_AMBULATORY_CARE_PROVIDER_SITE_OTHER)
Admission: EM | Admit: 2014-05-16 | Discharge: 2014-05-16 | Disposition: A | Discharge status: Home / Self Care | Payer: Medicaid Other | Source: Home / Self Care | Attending: Family Medicine | Admitting: Family Medicine

## 2014-05-16 DIAGNOSIS — H5319 Other subjective visual disturbances: Secondary | ICD-10-CM

## 2014-05-16 DIAGNOSIS — H5712 Ocular pain, left eye: Secondary | ICD-10-CM | POA: Diagnosis not present

## 2014-05-16 DIAGNOSIS — H53142 Visual discomfort, left eye: Secondary | ICD-10-CM

## 2014-05-16 DIAGNOSIS — H538 Other visual disturbances: Secondary | ICD-10-CM | POA: Diagnosis not present

## 2014-05-16 LAB — GLUCOSE, CAPILLARY: GLUCOSE-CAPILLARY: 210 mg/dL — AB (ref 70–99)

## 2014-05-16 MED ORDER — TETRACAINE HCL 0.5 % OP SOLN
OPHTHALMIC | Status: AC
  Filled 2014-05-16: qty 2

## 2014-05-16 MED ORDER — TETRACAINE HCL 0.5 % OP SOLN
1.0000 [drp] | Freq: Once | OPHTHALMIC | Status: AC
  Administered 2014-05-16: 2 [drp] via OPHTHALMIC

## 2014-05-16 NOTE — Discharge Instructions (Signed)
Go directly to Dr. Jeannine Kitten office for eval.

## 2014-05-16 NOTE — ED Notes (Signed)
C/o poss left pink eye onset 3 days Sx include watery, redness, painful Alert, no signs of acute distress.

## 2014-05-16 NOTE — ED Provider Notes (Signed)
CSN: [id number]     Arrival date & time 05/16/14  1403 History   First MD Initiated Contact with Patient 05/16/14 1438     Chief Complaint  Patient presents with  . Eye Pain    Patient is a 48 y.o. female presenting with eye pain.  Eye Pain Associated symptoms include headaches. Pertinent negatives include no chest pain.  Tara Keller is an obese AA female with a history DM2 here complaining of left eye pain that started 3 days ago.  She complains of HA behind the eye, conjunctival redness, photophobia. She denies nausea and changes in vision.  She reports her eyes have previously been healthy.    Past Medical History  Diagnosis Date  . Diabetes mellitus   . Asthma   . Hypertension   . Obesity   . Arthritis   . H/O tubal ligation   . Sleep apnea     uses CPAP  . Anxiety   . Depression   . Fracture closed of upper end of forearm 9 years, Fx left left leg  . Fracture of left lower leg @ 49 years old  . Fatty liver   . Positive H. pylori test   . Tubular adenoma of colon    Past Surgical History  Procedure Laterality Date  . Cholecystectomy    . Tubal ligation Bilateral   . Colonoscopy with propofol N/A 08/15/2013    Procedure: COLONOSCOPY WITH PROPOFOL;  Surgeon: Jerene Bears, MD;  Location: [facility];  Service: Gastroenterology;  Laterality: N/A;  . Esophagogastroduodenoscopy (egd) with propofol N/A 08/15/2013    Procedure: ESOPHAGOGASTRODUODENOSCOPY (EGD) WITH PROPOFOL;  Surgeon: Jerene Bears, MD;  Location: [facility];  Service: Gastroenterology;  Laterality: N/A;  . Esophagogastroduodenoscopy N/A 05/12/2014    Procedure: ESOPHAGOGASTRODUODENOSCOPY (EGD);  Surgeon: Jerene Bears, MD;  Location: [facility];  Service: Gastroenterology;  Laterality: N/A;   Family History  Problem Relation Age of Onset  . Diabetes Mother   . Stroke Mother   . Hypertension Mother   . Cancer Sister    History  Substance Use Topics  . Smoking status: Current Every Day Smoker -- 0.25  packs/day    Types: Cigarettes  . Smokeless tobacco: Never Used  . Alcohol Use: No   OB History    Gravida Para Term Preterm AB TAB SAB Ectopic Multiple Living   3 3 2 1     1 4      Review of Systems  Constitutional: Negative for chills and diaphoresis.  HENT: Negative for congestion.   Eyes: Positive for pain and redness. Negative for itching and visual disturbance.  Respiratory: Negative for wheezing.   Cardiovascular: Negative for chest pain.  Gastrointestinal: Negative for abdominal distention.  Musculoskeletal: Negative for back pain.  Neurological: Positive for headaches. Negative for dizziness, facial asymmetry, speech difficulty, weakness and light-headedness.    Allergies  Review of patient's allergies indicates no known allergies.  Home Medications   Prior to Admission medications   Medication Sig Start Date End Date Taking? Authorizing Provider  buPROPion (WELLBUTRIN XL) 300 MG 24 hr tablet Take 1 tablet by mouth 2 (two) times daily. 04/17/14  Yes Historical Provider, MD  clonazePAM (KLONOPIN) 1 MG tablet Take 1 mg by mouth at bedtime.   Yes Historical Provider, MD  DULoxetine (CYMBALTA) 60 MG capsule Take 120 mg by mouth every morning.    Yes Historical Provider, MD  hydrocortisone cream 0.5 % Apply 1 application topically 2 (two) times daily. 02/25/14  Yes Brayton Caves, PA-C  hydrOXYzine (VISTARIL) 25 MG capsule Take 4 capsules (100 mg total) by mouth 3 (three) times daily as needed for itching. 02/25/14  Yes Tiffany Daneil Dan, PA-C  insulin aspart (NOVOLOG) 100 UNIT/ML FlexPen Inject 3 Units into the skin 3 (three) times daily with meals. 01/28/14  Yes Lorayne Marek, MD  Insulin Glargine (LANTUS SOLOSTAR) 100 UNIT/ML Solostar Pen Inject 15 Units into the skin at bedtime. 11/28/13  Yes Lorayne Marek, MD  metFORMIN (GLUCOPHAGE) 1000 MG tablet Take 1 tablet (1,000 mg total) by mouth 2 (two) times daily with a meal. 01/14/14  Yes Deepak Advani, MD  metoCLOPramide (REGLAN) 10 MG  tablet Take 1 tablet (10 mg total) by mouth 4 (four) times daily -  before meals and at bedtime. 05/06/14  Yes Jerene Bears, MD  pantoprazole (PROTONIX) 40 MG tablet Take 1 tablet (40 mg total) by mouth 2 (two) times daily before a meal. 05/12/14  Yes Jerene Bears, MD  Albiglutide 30 MG PEN Inject 30 mg into the skin once a week.    Historical Provider, MD  fluticasone (FLONASE) 50 MCG/ACT nasal spray Place 2 sprays into both nostrils daily. 11/26/13   Lorayne Marek, MD  Insulin Syringe-Needle U-100 31G X 5/16" 0.3 ML MISC Use with solostar pen to inject 4 times daily 03/13/14   Lorayne Marek, MD  mirtazapine (REMERON) 15 MG tablet Take 1 tablet by mouth daily. 04/17/14   Historical Provider, MD  NEEDLE, DISP, 24 G (B-D DISP NEEDLE 24GX1") 24G X 1" MISC Use with solostar pen to inject 4 times daily 03/13/14   Lorayne Marek, MD  sucralfate (CARAFATE) 1 GM/10ML suspension Take 1 g by mouth 4 (four) times daily.    Historical Provider, MD   Tonometry of left eye:18.   BP 158/87 mmHg  Pulse 90  Temp(Src) 98.5 F (36.9 C) (Oral)  Resp 18  SpO2 97% Physical Exam  Constitutional: She appears well-developed and well-nourished.  Eyes: Lids are normal. Lids are everted and swept, no foreign bodies found. Right eye exhibits no discharge, no exudate and no hordeolum. No foreign body present in the right eye. Left eye exhibits no discharge, no exudate and no hordeolum. No foreign body present in the left eye. Right conjunctiva is not injected. Right conjunctiva has no hemorrhage. Left conjunctiva is injected. Left conjunctiva has no hemorrhage. No scleral icterus. Left eye exhibits abnormal extraocular motion. Right pupil is round and reactive. Left pupil is not round and not reactive.  Slit lamp exam:      The right eye shows no corneal abrasion, no corneal ulcer, no foreign body, no hyphema, no hypopyon and no anterior chamber bulge.       The left eye shows no corneal abrasion, no corneal ulcer, no foreign body,  no hyphema, no hypopyon and no fluorescein uptake.  Cardiovascular: Normal rate.   Pulmonary/Chest: Effort normal.  Musculoskeletal: Normal range of motion.  Nursing note and vitals reviewed.   ED Course  Procedures (including critical care time) Labs Review Labs Reviewed  GLUCOSE, CAPILLARY - Abnormal; Notable for the following:    Glucose-Capillary 210 (*)    All other components within normal limits    Imaging Review No results found.   MDM   1. Eye pain, left   2. Blurry vision, left eye   3. Photophobia of left eye    Patient with etiology not improved by tetracine.  Optho consulted and patient to go directly to see Dr.  Choi today.  Appreciate his recommendations and service.     Tereasa Coop, PA-C 05/16/14 1537

## 2014-05-18 ENCOUNTER — Telehealth: Payer: Self-pay | Admitting: Internal Medicine

## 2014-05-18 NOTE — Telephone Encounter (Signed)
Patient called to request a med refill for Insulin Glargine (LANTUS SOLOSTAR) 100 UNIT/ML Solostar Pen. Please f/u with pt.

## 2014-05-19 ENCOUNTER — Telehealth: Payer: Self-pay

## 2014-05-19 MED ORDER — INSULIN GLARGINE 100 UNIT/ML SOLOSTAR PEN: 15.0000 [IU] | PEN_INJECTOR | Freq: Every day | SUBCUTANEOUS | Status: DC

## 2014-05-19 NOTE — Telephone Encounter (Signed)
Patient called to check on the status of medication refill for Insulin Glargine (LANTUS SOLOSTAR) 100 UNIT/ML Solostar Pen.  Patient stated she is completely out of medication. Please f/u

## 2014-05-19 NOTE — Telephone Encounter (Signed)
Patient called requesting a refill on her lantus Prescription sent to [facility] on file

## 2014-06-01 ENCOUNTER — Encounter: Payer: Medicaid Other | Attending: Internal Medicine | Admitting: Dietician

## 2014-06-01 ENCOUNTER — Encounter: Payer: Self-pay | Admitting: Dietician

## 2014-06-01 VITALS — Ht 66.0 in | Wt 347.2 lb

## 2014-06-01 DIAGNOSIS — Z713 Dietary counseling and surveillance: Secondary | ICD-10-CM | POA: Insufficient documentation

## 2014-06-01 DIAGNOSIS — E119 Type 2 diabetes mellitus without complications: Secondary | ICD-10-CM | POA: Insufficient documentation

## 2014-06-01 DIAGNOSIS — Z794 Long term (current) use of insulin: Secondary | ICD-10-CM | POA: Diagnosis not present

## 2014-06-01 NOTE — Progress Notes (Signed)
  Medical Nutrition Therapy:  Appt start time: 6283 end time:  1215.  Assessment:  Tara Keller returns for a follow up for diabetes. Gained 6 lbs since previous visit. Will be starting Invokana today. Blood pressure has gone up. Things have been "hard" since last time. States that eating habits have been good but sometimes has to eat what is in the house. Has been having a lot of sweets. Sometimes feels weak and tired and does not get out of bed.   Testing blood sugar 3 x day average 129-300 mg/dl.  Bought some containers to help measure portions of food group. Still using them. Trying to eat 3-5 hours but will skip meals sometimes. Sometimes will just eat bread if that is the only thing she has at home.   Preferred Learning Style:   No preference indicated   Learning Readiness:   Ready  MEDICATIONS:insulin, metformin, invokana   DIETARY INTAKE:  Usual eating pattern includes 1-3 meals and 0-1 snacks per day.  Avoided foods include cauliflower, asparagus, squash, chips, eggs  24-hr recall:  B ( AM): cereal (Raisin Bran) with 1% milk and water  Snk ( AM): none  L ( PM): frozen meal or tyson nuggets  Snk ( PM): none or yogurt  D ( PM): frozen meal or bologna sandwich  Snk ( PM): depends - cereal or bologna sandwich  Beverages: water, crystal light lemonade, minute maid light 15 calories  Usual physical activity: 15 minutes on treadmill 3 x week  Estimated energy needs: 1600 calories 180 g carbohydrates 120 g protein 44 g fat  Progress Towards Goal(s):  In progress.   Nutritional Diagnosis:  NB-1.1 Food and nutrition-related knowledge deficit As related to obesity, diabetes, and gastroparesis .  As evidenced by BMI of 55.6, elevated blood sugar, and chronic feelings of fullness .    Intervention:  Nutrition counseling provided.  Don't have ice cream or other tempting sweets at home.  Have a splurge food (sweet) every once in a while instead of a carb at a meal. Plan to walk 3  x week for 30 minutes.  Add some vegetables to meals - eat more of these if you are still hungry.  Continue to take 20 minutes to eat - chew well, put your fork down, drink water.  Continue to portion out food in containers. Have a protein and carb every time you eat [facility] [employer], yogurt, peanut butter and jelly on wheat bread, fruit with nuts or cheese or meats). Try [employer] with a carb (bread or fruit) for times when you are likely to skip meals.    Teaching Method Utilized:  Visual Auditory Hands on  Barriers to learning/adherence to lifestyle change: limited income, multiple chronic health conditions  Demonstrated degree of understanding via:  Teach Back   Monitoring/Evaluation:  Dietary intake, exercise, and body weight in 2 month(s).

## 2014-06-01 NOTE — Patient Instructions (Addendum)
Don't have ice cream or other tempting sweets at home.  Have a splurge food (sweet) every once in a while instead of a carb at a meal. Plan to walk 3 x week for 30 minutes.  Add some vegetables to meals - eat more of these if you are still hungry.  Continue to take 20 minutes to eat - chew well, put your fork down, drink water.  Continue to portion out food in containers. Have a protein and carb every time you eat [facility] [employer], yogurt, peanut butter and jelly on wheat bread, fruit with nuts or cheese or meats). Try [employer] with a carb (bread or fruit) for times when you are likely to skip meals.

## 2014-06-03 ENCOUNTER — Ambulatory Visit: Payer: Medicaid Other

## 2014-06-15 ENCOUNTER — Telehealth: Payer: Self-pay

## 2014-06-15 ENCOUNTER — Telehealth: Payer: Self-pay | Admitting: Internal Medicine

## 2014-06-15 DIAGNOSIS — E089 Diabetes mellitus due to underlying condition without complications: Secondary | ICD-10-CM

## 2014-06-15 MED ORDER — METFORMIN HCL 1000 MG PO TABS: 1000.0000 mg | ORAL_TABLET | Freq: Two times a day (BID) | ORAL | Status: DC

## 2014-06-15 NOTE — Telephone Encounter (Signed)
Patient called requesting a refill on her metformin Thirty day supply given and call transferred to make an appointment

## 2014-06-15 NOTE — Telephone Encounter (Signed)
Patient called requesting medication refill on metFORMIN (GLUCOPHAGE) 1000 MG tablet. Patient is completely out of medication and would like meds sent to [facility] on file. Please f/u with patient

## 2014-06-26 ENCOUNTER — Ambulatory Visit: Payer: Medicaid Other | Attending: Internal Medicine | Admitting: Internal Medicine

## 2014-06-26 ENCOUNTER — Encounter: Payer: Self-pay | Admitting: Internal Medicine

## 2014-06-26 VITALS — BP 129/85 | HR 86 | Temp 98.0°F | Resp 16 | Wt 342.0 lb

## 2014-06-26 DIAGNOSIS — K2100 Gastro-esophageal reflux disease with esophagitis, without bleeding: Secondary | ICD-10-CM

## 2014-06-26 DIAGNOSIS — F329 Major depressive disorder, single episode, unspecified: Secondary | ICD-10-CM | POA: Diagnosis not present

## 2014-06-26 DIAGNOSIS — F17210 Nicotine dependence, cigarettes, uncomplicated: Secondary | ICD-10-CM | POA: Diagnosis not present

## 2014-06-26 DIAGNOSIS — Z79899 Other long term (current) drug therapy: Secondary | ICD-10-CM | POA: Insufficient documentation

## 2014-06-26 DIAGNOSIS — Z7951 Long term (current) use of inhaled steroids: Secondary | ICD-10-CM | POA: Insufficient documentation

## 2014-06-26 DIAGNOSIS — Z9049 Acquired absence of other specified parts of digestive tract: Secondary | ICD-10-CM | POA: Diagnosis not present

## 2014-06-26 DIAGNOSIS — E119 Type 2 diabetes mellitus without complications: Secondary | ICD-10-CM | POA: Insufficient documentation

## 2014-06-26 DIAGNOSIS — J45909 Unspecified asthma, uncomplicated: Secondary | ICD-10-CM | POA: Diagnosis not present

## 2014-06-26 DIAGNOSIS — R21 Rash and other nonspecific skin eruption: Secondary | ICD-10-CM | POA: Diagnosis not present

## 2014-06-26 DIAGNOSIS — E139 Other specified diabetes mellitus without complications: Secondary | ICD-10-CM

## 2014-06-26 DIAGNOSIS — F419 Anxiety disorder, unspecified: Secondary | ICD-10-CM | POA: Insufficient documentation

## 2014-06-26 DIAGNOSIS — Z794 Long term (current) use of insulin: Secondary | ICD-10-CM | POA: Insufficient documentation

## 2014-06-26 LAB — GLUCOSE, POCT (MANUAL RESULT ENTRY): POC Glucose: 239 mg/dl — AB (ref 70–99)

## 2014-06-26 LAB — POCT GLYCOSYLATED HEMOGLOBIN (HGB A1C): Hemoglobin A1C: 10.6

## 2014-06-26 MED ORDER — METFORMIN HCL 1000 MG PO TABS: 1000.0000 mg | ORAL_TABLET | Freq: Two times a day (BID) | ORAL | Status: DC

## 2014-06-26 MED ORDER — NYSTATIN-TRIAMCINOLONE 100000-0.1 UNIT/GM-% EX OINT: 1.0000 "application " | TOPICAL_OINTMENT | Freq: Two times a day (BID) | CUTANEOUS | Status: DC

## 2014-06-26 NOTE — Patient Instructions (Signed)
Diabetes Mellitus and Food It is important for you to manage your blood sugar (glucose) level. Your blood glucose level can be greatly affected by what you eat. Eating healthier foods in the appropriate amounts throughout the day at about the same time each day will help you control your blood glucose level. It can also help slow or prevent worsening of your diabetes mellitus. Healthy eating may even help you improve the level of your blood pressure and reach or maintain a healthy weight.  HOW CAN FOOD AFFECT ME? Carbohydrates Carbohydrates affect your blood glucose level more than any other type of food. Your dietitian will help you determine how many carbohydrates to eat at each meal and teach you how to count carbohydrates. Counting carbohydrates is important to keep your blood glucose at a healthy level, especially if you are using insulin or taking certain medicines for diabetes mellitus. Alcohol Alcohol can cause sudden decreases in blood glucose (hypoglycemia), especially if you use insulin or take certain medicines for diabetes mellitus. Hypoglycemia can be a life-threatening condition. Symptoms of hypoglycemia (sleepiness, dizziness, and disorientation) are similar to symptoms of having too much alcohol.  If your health care provider has given you approval to drink alcohol, do so in moderation and use the following guidelines:  Women should not have more than one drink per day, and men should not have more than two drinks per day. One drink is equal to:  12 oz of beer.  5 oz of wine.  1 oz of hard liquor.  Do not drink on an empty stomach.  Keep yourself hydrated. Have water, diet soda, or unsweetened iced tea.  Regular soda, juice, and other mixers might contain a lot of carbohydrates and should be counted. WHAT FOODS ARE NOT RECOMMENDED? As you make food choices, it is important to remember that all foods are not the same. Some foods have fewer nutrients per serving than other  foods, even though they might have the same number of calories or carbohydrates. It is difficult to get your body what it needs when you eat foods with fewer nutrients. Examples of foods that you should avoid that are high in calories and carbohydrates but low in nutrients include:  Trans fats (most processed foods list trans fats on the Nutrition Facts label).  Regular soda.  Juice.  Candy.  Sweets, such as cake, pie, doughnuts, and cookies.  Fried foods. WHAT FOODS CAN I EAT? Have nutrient-rich foods, which will nourish your body and keep you healthy. The food you should eat also will depend on several factors, including:  The calories you need.  The medicines you take.  Your weight.  Your blood glucose level.  Your blood pressure level.  Your cholesterol level. You also should eat a variety of foods, including:  Protein, such as meat, poultry, fish, tofu, nuts, and seeds (lean animal proteins are best).  Fruits.  Vegetables.  Dairy products, such as milk, cheese, and yogurt (low fat is best).  Breads, grains, pasta, cereal, rice, and beans.  Fats such as olive oil, trans fat-free margarine, canola oil, avocado, and olives. DOES EVERYONE WITH DIABETES MELLITUS HAVE THE SAME MEAL PLAN? Because every person with diabetes mellitus is different, there is not one meal plan that works for everyone. It is very important that you meet with a dietitian who will help you create a meal plan that is just right for you. Document Released: 09/22/2004 Document Revised: 12/31/2012 Document Reviewed: 11/22/2012 [employer] Patient Information 2015 [employer]. This   information is not intended to replace advice given to you by your health care provider. Make sure you discuss any questions you have with your health care provider.  

## 2014-06-26 NOTE — Progress Notes (Signed)
Patient here for follow up on her diabetes Patient complains of epigastric pain and nausea for the past week Patient states she used to take something for this but hasnt in a while She also complains of a rash under her breasts Patient requesting a refill on her metformin Also due for her pap so she will need to schedule that before she leaves

## 2014-06-26 NOTE — Progress Notes (Signed)
MRN: [id number] Name: Tara Keller  Sex: female Age: 49 y.o. DOB: 07/31/1965  Allergies: Review of patient's allergies indicates no known allergies.  Chief Complaint  Patient presents with  . Follow-up    HPI: Patient is 49 y.o. female who has to of diabetes, GERD comes today for followup as per patient she has followed up with endocrinologist and her medications were optimized currently taking insulin and metformin, her hemoglobin A1c has trended down, she denies any hypoglycemic symptoms, last month she also had her upper GI endoscopy done which reported esophagitis, patient was advised to increase the dose of Protonix to twice a day, as well as she takes Reglan when necessary for nausea, currently she denies any nausea vomiting change in bowel habits. Patient ran out of her metformin and its refill today.patient is also complaining of itchy rash under her breast, she has tried over-the-counter lotions without much improvement  Past Medical History  Diagnosis Date  . Diabetes mellitus   . Asthma   . Hypertension   . Obesity   . Arthritis   . H/O tubal ligation   . Sleep apnea     uses CPAP  . Anxiety   . Depression   . Fracture closed of upper end of forearm 9 years, Fx left left leg  . Fracture of left lower leg @ 49 years old  . Fatty liver   . Positive H. pylori test   . Tubular adenoma of colon     Past Surgical History  Procedure Laterality Date  . Cholecystectomy    . Tubal ligation Bilateral   . Colonoscopy with propofol N/A 08/15/2013    Procedure: COLONOSCOPY WITH PROPOFOL;  Surgeon: Jerene Bears, MD;  Location: [facility];  Service: Gastroenterology;  Laterality: N/A;  . Esophagogastroduodenoscopy (egd) with propofol N/A 08/15/2013    Procedure: ESOPHAGOGASTRODUODENOSCOPY (EGD) WITH PROPOFOL;  Surgeon: Jerene Bears, MD;  Location: [facility];  Service: Gastroenterology;  Laterality: N/A;  . Esophagogastroduodenoscopy N/A 05/12/2014    Procedure:  ESOPHAGOGASTRODUODENOSCOPY (EGD);  Surgeon: Jerene Bears, MD;  Location: [facility];  Service: Gastroenterology;  Laterality: N/A;      Medication List       This list is accurate as of: 06/26/14  3:04 PM.  Always use your most recent med list.               Albiglutide 30 MG Pen  Inject 30 mg into the skin once a week.     buPROPion 300 MG 24 hr tablet  Commonly known as:  WELLBUTRIN XL  Take 1 tablet by mouth 2 (two) times daily.     clonazePAM 1 MG tablet  Commonly known as:  KLONOPIN  Take 1 mg by mouth at bedtime.     DULoxetine 60 MG capsule  Commonly known as:  CYMBALTA  Take 120 mg by mouth every morning.     fluticasone 50 MCG/ACT nasal spray  Commonly known as:  FLONASE  Place 2 sprays into both nostrils daily.     hydrocortisone cream 0.5 %  Apply 1 application topically 2 (two) times daily.     hydrOXYzine 25 MG capsule  Commonly known as:  VISTARIL  Take 4 capsules (100 mg total) by mouth 3 (three) times daily as needed for itching.     insulin aspart 100 UNIT/ML FlexPen  Commonly known as:  NOVOLOG  Inject 3 Units into the skin 3 (three) times daily with meals.     Insulin  Glargine 100 UNIT/ML Solostar Pen  Commonly known as:  LANTUS SOLOSTAR  Inject 15 Units into the skin at bedtime.     Insulin Syringe-Needle U-100 31G X 5/16" 0.3 ML Misc  Use with solostar pen to inject 4 times daily     INVOKANA PO  Take by mouth.     metFORMIN 1000 MG tablet  Commonly known as:  GLUCOPHAGE  Take 1 tablet (1,000 mg total) by mouth 2 (two) times daily with a meal.     metoCLOPramide 10 MG tablet  Commonly known as:  REGLAN  Take 1 tablet (10 mg total) by mouth 4 (four) times daily -  before meals and at bedtime.     mirtazapine 15 MG tablet  Commonly known as:  REMERON  Take 1 tablet by mouth daily.     NEEDLE (DISP) 24 G 24G X 1" Misc  Commonly known as:  B-D DISP NEEDLE 24GX1"  Use with solostar pen to inject 4 times daily      nystatin-triamcinolone ointment  Commonly known as:  MYCOLOG  Apply 1 application topically 2 (two) times daily.     pantoprazole 40 MG tablet  Commonly known as:  PROTONIX  Take 1 tablet (40 mg total) by mouth 2 (two) times daily before a meal.     sucralfate 1 GM/10ML suspension  Commonly known as:  CARAFATE  Take 1 g by mouth 4 (four) times daily.        Meds ordered this encounter  Medications  . nystatin-triamcinolone ointment (MYCOLOG)    Sig: Apply 1 application topically 2 (two) times daily.    Dispense:  30 g    Refill:  1  . metFORMIN (GLUCOPHAGE) 1000 MG tablet    Sig: Take 1 tablet (1,000 mg total) by mouth 2 (two) times daily with a meal.    Dispense:  60 tablet    Refill:  3    Pt needs apointment with PCP    Immunization History  Administered Date(s) Administered  . Hepatitis A, Adult 08/22/2013, 02/24/2014  . Pneumococcal Polysaccharide-23 01/28/2014    Family History  Problem Relation Age of Onset  . Diabetes Mother   . Stroke Mother   . Hypertension Mother   . Cancer Sister     History  Substance Use Topics  . Smoking status: Current Every Day Smoker -- 0.25 packs/day    Types: Cigarettes  . Smokeless tobacco: Never Used  . Alcohol Use: No    Review of Systems   As noted in HPI  Filed Vitals:   06/26/14 1413  BP: 129/85  Pulse: 86  Temp: 98 F (36.7 C)  Resp: 16    Physical Exam  Physical Exam  Constitutional: No distress.  Eyes: EOM are normal. Pupils are equal, round, and reactive to light.  Cardiovascular: Normal rate and regular rhythm.   Pulmonary/Chest: Breath sounds normal. No respiratory distress. She has no wheezes. She has no rales.  Abdominal: Soft. There is no tenderness. There is no rebound and no guarding.  Musculoskeletal: She exhibits no edema.    CBC    Component Value Date/Time   WBC 12.0* 02/16/2014 1538   RBC 4.70 02/16/2014 1538   HGB 12.8 02/16/2014 1538   HCT 38.6 02/16/2014 1538   PLT 316.0  02/16/2014 1538   MCV 82.1 02/16/2014 1538   LYMPHSABS 3.5 02/16/2014 1538   MONOABS 0.4 02/16/2014 1538   EOSABS 0.3 02/16/2014 1538   BASOSABS 0.1 02/16/2014 1538  CMP     Component Value Date/Time   NA 133* 02/16/2014 1538   K 4.1 02/16/2014 1538   CL 98 02/16/2014 1538   CO2 30 02/16/2014 1538   GLUCOSE 263* 02/16/2014 1538   BUN 6 02/16/2014 1538   CREATININE 0.66 02/16/2014 1538   CREATININE 0.80 07/31/2012 1648   CALCIUM 9.4 02/16/2014 1538   PROT 7.5 02/16/2014 1538   ALBUMIN 3.6 02/16/2014 1538   AST 38* 02/16/2014 1538   ALT 58* 02/16/2014 1538   ALKPHOS 199* 02/16/2014 1538   BILITOT 0.3 02/16/2014 1538   GFRNONAA >90 11/18/2013 1542   GFRAA >90 11/18/2013 1542    Lab Results  Component Value Date/Time   CHOL 127 08/15/2012 10:58 AM    Lab Results  Component Value Date/Time   HGBA1C 10.60 06/26/2014 02:31 PM   HGBA1C 7.8* 07/31/2012 04:48 PM    Lab Results  Component Value Date/Time   AST 38* 02/16/2014 03:38 PM    Assessment and Plan  Other specified diabetes mellitus without complications - Plan:  Results for orders placed or performed in visit on 06/26/14  Glucose (CBG)  Result Value Ref Range   POC Glucose 239 (A) 70 - 99 mg/dl  HgB A1c  Result Value Ref Range   Hemoglobin A1C 10.60    Hemoglobin A1c has trended down, she'll continue with metformin and insulin and patient is scheduled to follow with her endocrinologist. HgB A1c, metFORMIN (GLUCOPHAGE) 1000 MG tablet  Rash and nonspecific skin eruption - Plan: nystatin-triamcinolone ointment (MYCOLOG)  Gastroesophageal reflux disease with esophagitis Lifestyle modification, continue with Protonix twice a day, Reglan when necessary for nausea, she is following up with GI.   Return in about 3 months (around 09/26/2014), or if symptoms worsen or fail to improve.   This note has been created with Surveyor, quantity. Any transcriptional errors  are unintentional.    Lorayne Marek, MD

## 2014-07-03 ENCOUNTER — Other Ambulatory Visit (HOSPITAL_COMMUNITY)
Admission: RE | Admit: 2014-07-03 | Discharge: 2014-07-03 | Disposition: A | Discharge status: Home / Self Care | Payer: Medicaid Other | Source: Ambulatory Visit | Attending: Internal Medicine | Admitting: Internal Medicine

## 2014-07-03 ENCOUNTER — Encounter: Payer: Self-pay | Admitting: Internal Medicine

## 2014-07-03 ENCOUNTER — Ambulatory Visit: Payer: Medicaid Other | Attending: Internal Medicine | Admitting: Internal Medicine

## 2014-07-03 VITALS — BP 127/85 | HR 100 | Resp 16 | Wt 348.6 lb

## 2014-07-03 DIAGNOSIS — Z113 Encounter for screening for infections with a predominantly sexual mode of transmission: Secondary | ICD-10-CM | POA: Insufficient documentation

## 2014-07-03 DIAGNOSIS — Z01419 Encounter for gynecological examination (general) (routine) without abnormal findings: Secondary | ICD-10-CM | POA: Diagnosis present

## 2014-07-03 DIAGNOSIS — N760 Acute vaginitis: Secondary | ICD-10-CM | POA: Insufficient documentation

## 2014-07-03 DIAGNOSIS — Z124 Encounter for screening for malignant neoplasm of cervix: Secondary | ICD-10-CM | POA: Insufficient documentation

## 2014-07-03 NOTE — Progress Notes (Signed)
  Pt is here for a Pap Exam.

## 2014-07-04 LAB — RPR

## 2014-07-04 LAB — HIV ANTIBODY (ROUTINE TESTING W REFLEX): HIV 1&2 Ab, 4th Generation: NONREACTIVE

## 2014-07-06 LAB — CERVICOVAGINAL ANCILLARY ONLY
Chlamydia: NEGATIVE
Neisseria Gonorrhea: NEGATIVE
WET PREP (BD AFFIRM): NEGATIVE

## 2014-07-07 ENCOUNTER — Encounter: Payer: Self-pay | Admitting: Internal Medicine

## 2014-07-07 LAB — CYTOLOGY - PAP

## 2014-07-09 ENCOUNTER — Telehealth: Payer: Self-pay | Admitting: General Practice

## 2014-07-09 ENCOUNTER — Telehealth: Payer: Self-pay

## 2014-07-09 DIAGNOSIS — E139 Other specified diabetes mellitus without complications: Secondary | ICD-10-CM

## 2014-07-09 MED ORDER — GLUCOSE BLOOD VI STRP: ORAL_STRIP | Status: DC

## 2014-07-09 MED ORDER — "NEEDLE (DISP) 24G X 1"" MISC": Status: DC

## 2014-07-09 MED ORDER — METFORMIN HCL 1000 MG PO TABS: 1000.0000 mg | ORAL_TABLET | Freq: Two times a day (BID) | ORAL | Status: DC

## 2014-07-09 NOTE — Progress Notes (Signed)
Patient ID: Tara Keller, female   DOB: 08/18/1965, 49 y.o.   MRN: [id number]  CC: pap smear  HPI: Tara Keller is a 49 y.o. female here today for a pap smear.  Patient has past medical history of T2DM, HTN, and OSA. Patient denies any vaginal discharge, itch, odor, lesions, or dysuria. She would like STD testing today. Patient does not complete regular breast exams. No other concerns today.   Patient has No headache, No chest pain, No abdominal pain - No Nausea, No new weakness tingling or numbness, No Cough - SOB.  No Known Allergies Past Medical History  Diagnosis Date  . Diabetes mellitus   . Asthma   . Hypertension   . Obesity   . Arthritis   . H/O tubal ligation   . Sleep apnea     uses CPAP  . Anxiety   . Depression   . Fracture closed of upper end of forearm 9 years, Fx left left leg  . Fracture of left lower leg @ 49 years old  . Fatty liver   . Positive H. pylori test   . Tubular adenoma of colon    Current Outpatient Prescriptions on File Prior to Visit  Medication Sig Dispense Refill  . Albiglutide 30 MG PEN Inject 30 mg into the skin once a week.    [name] buPROPion (WELLBUTRIN XL) 300 MG 24 hr tablet Take 1 tablet by mouth 2 (two) times daily.    . Canagliflozin (INVOKANA PO) Take by mouth.    . clonazePAM (KLONOPIN) 1 MG tablet Take 1 mg by mouth at bedtime.    . DULoxetine (CYMBALTA) 60 MG capsule Take 120 mg by mouth every morning.     . fluticasone (FLONASE) 50 MCG/ACT nasal spray Place 2 sprays into both nostrils daily. 16 g 6  . hydrocortisone cream 0.5 % Apply 1 application topically 2 (two) times daily. 30 g 0  . hydrOXYzine (VISTARIL) 25 MG capsule Take 4 capsules (100 mg total) by mouth 3 (three) times daily as needed for itching. 45 capsule 0  . insulin aspart (NOVOLOG) 100 UNIT/ML FlexPen Inject 3 Units into the skin 3 (three) times daily with meals. 15 mL 11  . Insulin Glargine (LANTUS SOLOSTAR) 100 UNIT/ML Solostar Pen Inject 15 Units into the skin at  bedtime. 5 pen 2  . Insulin Syringe-Needle U-100 31G X 5/16" 0.3 ML MISC Use with solostar pen to inject 4 times daily 250 each 12  . metFORMIN (GLUCOPHAGE) 1000 MG tablet Take 1 tablet (1,000 mg total) by mouth 2 (two) times daily with a meal. 60 tablet 3  . metoCLOPramide (REGLAN) 10 MG tablet Take 1 tablet (10 mg total) by mouth 4 (four) times daily -  before meals and at bedtime. 120 tablet 2  . mirtazapine (REMERON) 15 MG tablet Take 1 tablet by mouth daily.    [name] NEEDLE, DISP, 24 G (B-D DISP NEEDLE 24GX1") 24G X 1" MISC Use with solostar pen to inject 4 times daily 200 each 1  . nystatin-triamcinolone ointment (MYCOLOG) Apply 1 application topically 2 (two) times daily. 30 g 1  . pantoprazole (PROTONIX) 40 MG tablet Take 1 tablet (40 mg total) by mouth 2 (two) times daily before a meal. 60 tablet 5  . sucralfate (CARAFATE) 1 GM/10ML suspension Take 1 g by mouth 4 (four) times daily.     No current facility-administered medications on file prior to visit.   Family History  Problem Relation Age of Onset  .  Diabetes Mother   . Stroke Mother   . Hypertension Mother   . Cancer Sister    History   Social History  . Marital Status: Single    Spouse Name: N/A  . Number of Children: N/A  . Years of Education: N/A   Occupational History  . Not on file.   Social History Main Topics  . Smoking status: Current Every Day Smoker -- 0.25 packs/day    Types: Cigarettes  . Smokeless tobacco: Never Used  . Alcohol Use: No  . Drug Use: No  . Sexual Activity: Not on file   Other Topics Concern  . Not on file   Social History Narrative    Review of Systems: See HPI   Objective:   Filed Vitals:   07/03/14 1209  BP: 127/85  Pulse: 100  Resp: 16    Physical Exam  Pulmonary/Chest: Right breast exhibits no inverted nipple and no mass. Left breast exhibits no inverted nipple and no mass.  Genitourinary: Vagina normal and uterus normal. No breast tenderness or discharge. Cervix  exhibits no motion tenderness, no discharge and no friability. Right adnexum displays no tenderness. Left adnexum displays no tenderness.  Lymphadenopathy:       Right: No inguinal adenopathy present.       Left: No inguinal adenopathy present.    Lab Results  Component Value Date   WBC 12.0* 02/16/2014   HGB 12.8 02/16/2014   HCT 38.6 02/16/2014   MCV 82.1 02/16/2014   PLT 316.0 02/16/2014   Lab Results  Component Value Date   CREATININE 0.66 02/16/2014   BUN 6 02/16/2014   NA 133* 02/16/2014   K 4.1 02/16/2014   CL 98 02/16/2014   CO2 30 02/16/2014    Lab Results  Component Value Date   HGBA1C 10.60 06/26/2014   Lipid Panel     Component Value Date/Time   CHOL 127 08/15/2012 1058   TRIG 105 08/15/2012 1058   HDL 34* 08/15/2012 1058   CHOLHDL 3.7 08/15/2012 1058   VLDL 21 08/15/2012 1058   LDLCALC 72 08/15/2012 1058       Assessment and plan:   Tara Keller was seen today for pap only.  Diagnoses and all orders for this visit:  Papanicolaou smear Orders: -     Cytology - PAP [place] -     HIV antibody (with reflex) -     RPR -     Cervicovaginal ancillary only -     Cervicovaginal ancillary only   Return if symptoms worsen or fail to improve.        Chari Manning, NP-C [facility] [phone] 07/09/2014, 8:06 AM

## 2014-07-09 NOTE — Telephone Encounter (Signed)
Patient calling to request medication refill on the following:  metFORMIN (GLUCOPHAGE) 1000 MG tablet (system shows it was sent on 06/26/14, patient states pharmacy does not have).. Phentermine 15mg  capsule,  And ALL of her insulin supply, inlcuding pens and strips.   Patient states she is currently out. Advised patient that for all future request she should  Contact clinic when she is down to her last week worth. Patient verbalized understanding.3  Please assist.

## 2014-07-09 NOTE — Telephone Encounter (Signed)
Patient is aware her prescriptions were [facility] to [facility] on file

## 2014-07-10 ENCOUNTER — Other Ambulatory Visit: Payer: Self-pay

## 2014-07-10 MED ORDER — ACCU-CHEK MULTICLIX LANCETS MISC: Status: DC

## 2014-07-10 NOTE — Telephone Encounter (Signed)
Good morning Langley Gauss, you probably already took care of this but I am not sure. [facility] requested a refill for this patient for the following. [facility] uses [facility] off [address]... Thank you.  Accue-chek fastclix lancets -Qty 100.000 Accu-chek aviva plus test strips -qty 100.00

## 2014-08-04 ENCOUNTER — Ambulatory Visit: Payer: Medicaid Other | Admitting: Dietician

## 2014-08-11 ENCOUNTER — Other Ambulatory Visit: Payer: Self-pay

## 2014-08-11 ENCOUNTER — Telehealth: Payer: Self-pay | Admitting: Internal Medicine

## 2014-08-11 NOTE — Telephone Encounter (Signed)
Patient called to request a refill for her pen needles to check her sugar. Please f/u with pt.

## 2014-10-01 ENCOUNTER — Other Ambulatory Visit: Payer: Self-pay | Admitting: Internal Medicine

## 2014-10-01 MED ORDER — TRIAMCINOLONE ACETONIDE 0.1 % EX CREA: 1.0000 "application " | TOPICAL_CREAM | Freq: Two times a day (BID) | CUTANEOUS | Status: DC

## 2014-10-01 MED ORDER — NYSTATIN 100000 UNIT/GM EX CREA: 1.0000 "application " | TOPICAL_CREAM | Freq: Two times a day (BID) | CUTANEOUS | Status: DC

## 2014-10-19 ENCOUNTER — Telehealth: Payer: Self-pay

## 2014-10-19 NOTE — Telephone Encounter (Signed)
Nurse called patient, patient verified date of birth. Patient aware of prescription for cream being sent separately due to insurance not covering cream unless sent separately. Patient has picked prescription up and understands to use both creams. Patient voices understanding and has no further questions at this time.

## 2014-11-12 ENCOUNTER — Other Ambulatory Visit: Payer: Self-pay

## 2014-11-12 MED ORDER — INSULIN GLARGINE 100 UNIT/ML SOLOSTAR PEN: 15.0000 [IU] | PEN_INJECTOR | Freq: Every day | SUBCUTANEOUS | Status: DC

## 2015-03-12 ENCOUNTER — Emergency Department (INDEPENDENT_AMBULATORY_CARE_PROVIDER_SITE_OTHER)
Admission: EM | Admit: 2015-03-12 | Discharge: 2015-03-12 | Disposition: A | Discharge status: Other Acute Inpatient Hospital | Payer: Medicaid Other | Source: Home / Self Care | Attending: Family Medicine | Admitting: Family Medicine

## 2015-03-12 ENCOUNTER — Emergency Department (HOSPITAL_COMMUNITY): Payer: Medicaid Other

## 2015-03-12 ENCOUNTER — Encounter (HOSPITAL_COMMUNITY): Payer: Self-pay | Admitting: Nurse Practitioner

## 2015-03-12 ENCOUNTER — Encounter (HOSPITAL_COMMUNITY): Payer: Self-pay | Admitting: Emergency Medicine

## 2015-03-12 ENCOUNTER — Emergency Department (HOSPITAL_COMMUNITY)
Admission: EM | Admit: 2015-03-12 | Discharge: 2015-03-12 | Disposition: A | Discharge status: Home / Self Care | Payer: Medicaid Other | Attending: Emergency Medicine | Admitting: Emergency Medicine

## 2015-03-12 DIAGNOSIS — Z9981 Dependence on supplemental oxygen: Secondary | ICD-10-CM | POA: Insufficient documentation

## 2015-03-12 DIAGNOSIS — G4733 Obstructive sleep apnea (adult) (pediatric): Secondary | ICD-10-CM | POA: Diagnosis not present

## 2015-03-12 DIAGNOSIS — F17210 Nicotine dependence, cigarettes, uncomplicated: Secondary | ICD-10-CM | POA: Diagnosis not present

## 2015-03-12 DIAGNOSIS — E669 Obesity, unspecified: Secondary | ICD-10-CM | POA: Insufficient documentation

## 2015-03-12 DIAGNOSIS — Z79899 Other long term (current) drug therapy: Secondary | ICD-10-CM | POA: Insufficient documentation

## 2015-03-12 DIAGNOSIS — Z7951 Long term (current) use of inhaled steroids: Secondary | ICD-10-CM | POA: Diagnosis not present

## 2015-03-12 DIAGNOSIS — E119 Type 2 diabetes mellitus without complications: Secondary | ICD-10-CM | POA: Diagnosis not present

## 2015-03-12 DIAGNOSIS — R1312 Dysphagia, oropharyngeal phase: Secondary | ICD-10-CM

## 2015-03-12 DIAGNOSIS — I10 Essential (primary) hypertension: Secondary | ICD-10-CM | POA: Insufficient documentation

## 2015-03-12 DIAGNOSIS — J45909 Unspecified asthma, uncomplicated: Secondary | ICD-10-CM | POA: Diagnosis not present

## 2015-03-12 DIAGNOSIS — Z86010 Personal history of colonic polyps: Secondary | ICD-10-CM | POA: Insufficient documentation

## 2015-03-12 DIAGNOSIS — F419 Anxiety disorder, unspecified: Secondary | ICD-10-CM | POA: Diagnosis not present

## 2015-03-12 DIAGNOSIS — R1013 Epigastric pain: Secondary | ICD-10-CM | POA: Diagnosis not present

## 2015-03-12 DIAGNOSIS — Z87828 Personal history of other (healed) physical injury and trauma: Secondary | ICD-10-CM | POA: Insufficient documentation

## 2015-03-12 DIAGNOSIS — F329 Major depressive disorder, single episode, unspecified: Secondary | ICD-10-CM | POA: Diagnosis not present

## 2015-03-12 DIAGNOSIS — Z7984 Long term (current) use of oral hypoglycemic drugs: Secondary | ICD-10-CM | POA: Diagnosis not present

## 2015-03-12 DIAGNOSIS — Z794 Long term (current) use of insulin: Secondary | ICD-10-CM | POA: Insufficient documentation

## 2015-03-12 LAB — CBC
HEMATOCRIT: 41.2 % (ref 36.0–46.0)
Hemoglobin: 12.9 g/dL (ref 12.0–15.0)
MCH: 26.3 pg (ref 26.0–34.0)
MCHC: 31.3 g/dL (ref 30.0–36.0)
MCV: 84.1 fL (ref 78.0–100.0)
Platelets: 248 10*3/uL (ref 150–400)
RBC: 4.9 MIL/uL (ref 3.87–5.11)
RDW: 13.4 % (ref 11.5–15.5)
WBC: 6.3 10*3/uL (ref 4.0–10.5)

## 2015-03-12 LAB — COMPREHENSIVE METABOLIC PANEL
ALBUMIN: 3.1 g/dL — AB (ref 3.5–5.0)
ALT: 61 U/L — ABNORMAL HIGH (ref 14–54)
AST: 63 U/L — AB (ref 15–41)
Alkaline Phosphatase: 178 U/L — ABNORMAL HIGH (ref 38–126)
Anion gap: 11 (ref 5–15)
CO2: 28 mmol/L (ref 22–32)
Calcium: 9.3 mg/dL (ref 8.9–10.3)
Chloride: 97 mmol/L — ABNORMAL LOW (ref 101–111)
Creatinine, Ser: 0.76 mg/dL (ref 0.44–1.00)
GFR calc Af Amer: >60 mL/min (ref >60)
GFR calc non Af Amer: >60 mL/min (ref >60)
GLUCOSE: 339 mg/dL — AB (ref 65–99)
POTASSIUM: 3.3 mmol/L — AB (ref 3.5–5.1)
Sodium: 136 mmol/L (ref 135–145)
TOTAL PROTEIN: 6.8 g/dL (ref 6.5–8.1)
Total Bilirubin: 0.3 mg/dL (ref 0.3–1.2)

## 2015-03-12 LAB — LIPASE, BLOOD: Lipase: 27 U/L (ref 11–51)

## 2015-03-12 MED ORDER — GLUCAGON HCL RDNA (DIAGNOSTIC) 1 MG IJ SOLR
1.0000 mg | Freq: Once | INTRAMUSCULAR | Status: AC
  Administered 2015-03-12: 1 mg via INTRAVENOUS
  Filled 2015-03-12: qty 1

## 2015-03-12 MED ORDER — GLUCAGON HCL RDNA (DIAGNOSTIC) 1 MG IJ SOLR
1.0000 mg | Freq: Once | INTRAMUSCULAR | Status: DC
  Filled 2015-03-12: qty 1

## 2015-03-12 MED ORDER — POTASSIUM CHLORIDE CRYS ER 20 MEQ PO TBCR
40.0000 meq | EXTENDED_RELEASE_TABLET | Freq: Once | ORAL | Status: AC
  Administered 2015-03-12: 40 meq via ORAL
  Filled 2015-03-12: qty 2

## 2015-03-12 MED ORDER — METOCLOPRAMIDE HCL 5 MG/ML IJ SOLN
10.0000 mg | Freq: Once | INTRAMUSCULAR | Status: AC
  Administered 2015-03-12: 10 mg via INTRAVENOUS
  Filled 2015-03-12: qty 2

## 2015-03-12 MED ORDER — GI COCKTAIL ~~LOC~~
30.0000 mL | Freq: Once | ORAL | Status: DC
  Filled 2015-03-12: qty 30

## 2015-03-12 NOTE — ED Notes (Signed)
Iv  Ns  tko  20  Angio   r  Hand    1  Att        Nasal     02   AT  2  L  /  MIN      CARDIAC  MONITOR

## 2015-03-12 NOTE — ED Notes (Signed)
Pt departed in NAD.  

## 2015-03-12 NOTE — ED Provider Notes (Addendum)
CSN: [id number]     Arrival date & time 03/12/15  1433 History   First MD Initiated Contact with Patient 03/12/15 [phone]     Chief Complaint  Patient presents with  . Chest Pain   (Consider location/radiation/quality/duration/timing/severity/associated sxs/prior Treatment) Patient is a 50 y.o. female presenting with chest pain. The history is provided by the patient.  Chest Pain Pain location:  Substernal area and epigastric Pain quality: burning   Pain radiates to:  Does not radiate Pain radiates to the back: no   Pain severity:  Moderate (pt tearful.) Chronicity:  Recurrent Context: eating   Context comment:  Worse with food or fluids, feels obstructed. Associated symptoms: abdominal pain, dysphagia, heartburn, nausea and vomiting   Risk factors comment:  Obese, h/o gerd/   Past Medical History  Diagnosis Date  . Diabetes mellitus   . Asthma   . Hypertension   . Obesity   . Arthritis   . H/O tubal ligation   . Sleep apnea     uses CPAP  . Anxiety   . Depression   . Fracture closed of upper end of forearm 9 years, Fx left left leg  . Fracture of left lower leg @ 50 years old  . Fatty liver   . Positive H. pylori test   . Tubular adenoma of colon    Past Surgical History  Procedure Laterality Date  . Cholecystectomy    . Tubal ligation Bilateral   . Colonoscopy with propofol N/A 08/15/2013    Procedure: COLONOSCOPY WITH PROPOFOL;  Surgeon: Jerene Bears, MD;  Location: [facility];  Service: Gastroenterology;  Laterality: N/A;  . Esophagogastroduodenoscopy (egd) with propofol N/A 08/15/2013    Procedure: ESOPHAGOGASTRODUODENOSCOPY (EGD) WITH PROPOFOL;  Surgeon: Jerene Bears, MD;  Location: [facility];  Service: Gastroenterology;  Laterality: N/A;  . Esophagogastroduodenoscopy N/A 05/12/2014    Procedure: ESOPHAGOGASTRODUODENOSCOPY (EGD);  Surgeon: Jerene Bears, MD;  Location: [facility];  Service: Gastroenterology;  Laterality: N/A;   Family History  Problem Relation Age  of Onset  . Diabetes Mother   . Stroke Mother   . Hypertension Mother   . Cancer Sister    Social History  Substance Use Topics  . Smoking status: Current Every Day Smoker -- 0.25 packs/day    Types: Cigarettes  . Smokeless tobacco: Never Used  . Alcohol Use: No   OB History    Gravida Para Term Preterm AB TAB SAB Ectopic Multiple Living   3 3 2 1     1 4      Review of Systems  Constitutional: Positive for appetite change.  HENT: Positive for trouble swallowing.   Cardiovascular: Positive for chest pain.  Gastrointestinal: Positive for heartburn, nausea, vomiting and abdominal pain.  All other systems reviewed and are negative.   Allergies  Review of patient's allergies indicates no known allergies.  Home Medications   Prior to Admission medications   Medication Sig Start Date End Date Taking? Authorizing Provider  Albiglutide 30 MG PEN Inject 30 mg into the skin once a week.    Historical Provider, MD  buPROPion (WELLBUTRIN XL) 300 MG 24 hr tablet Take 1 tablet by mouth 2 (two) times daily. 04/17/14   Historical Provider, MD  Canagliflozin (INVOKANA PO) Take by mouth.    Historical Provider, MD  clonazePAM (KLONOPIN) 1 MG tablet Take 1 mg by mouth at bedtime.    Historical Provider, MD  DULoxetine (CYMBALTA) 60 MG capsule Take 120 mg by mouth every morning.  Historical Provider, MD  fluticasone (FLONASE) 50 MCG/ACT nasal spray Place 2 sprays into both nostrils daily. 11/26/13   Lorayne Marek, MD  glucose blood (CHOICE DM FORA G20 TEST STRIPS) test strip Use as instructed 07/09/14   Tresa Garter, MD  hydrocortisone cream 0.5 % Apply 1 application topically 2 (two) times daily. 02/25/14   Tiffany Daneil Dan, PA-C  hydrOXYzine (VISTARIL) 25 MG capsule Take 4 capsules (100 mg total) by mouth 3 (three) times daily as needed for itching. 02/25/14   Brayton Caves, PA-C  insulin aspart (NOVOLOG) 100 UNIT/ML FlexPen Inject 3 Units into the skin 3 (three) times daily with meals.  01/28/14   Lorayne Marek, MD  Insulin Glargine (LANTUS SOLOSTAR) 100 UNIT/ML Solostar Pen Inject 15 Units into the skin at bedtime. 11/12/14   Boykin Nearing, MD  Insulin Syringe-Needle U-100 31G X 5/16" 0.3 ML MISC Use with solostar pen to inject 4 times daily 03/13/14   Lorayne Marek, MD  Lancets (ACCU-CHEK MULTICLIX) lancets Use as instructed 07/10/14   Lorayne Marek, MD  metFORMIN (GLUCOPHAGE) 1000 MG tablet Take 1 tablet (1,000 mg total) by mouth 2 (two) times daily with a meal. 07/09/14   Tresa Garter, MD  metoCLOPramide (REGLAN) 10 MG tablet Take 1 tablet (10 mg total) by mouth 4 (four) times daily -  before meals and at bedtime. 05/06/14   Jerene Bears, MD  mirtazapine (REMERON) 15 MG tablet Take 1 tablet by mouth daily. 04/17/14   Historical Provider, MD  NEEDLE, DISP, 24 G (B-D DISP NEEDLE 24GX1") 24G X 1" MISC Use with solostar pen to inject 4 times daily 07/09/14   Tresa Garter, MD  nystatin cream (MYCOSTATIN) Apply 1 application topically 2 (two) times daily. 10/01/14   Tresa Garter, MD  pantoprazole (PROTONIX) 40 MG tablet Take 1 tablet (40 mg total) by mouth 2 (two) times daily before a meal. 05/12/14   Jerene Bears, MD  sucralfate (CARAFATE) 1 GM/10ML suspension Take 1 g by mouth 4 (four) times daily.    Historical Provider, MD  triamcinolone cream (KENALOG) 0.1 % Apply 1 application topically 2 (two) times daily. 10/01/14   Tresa Garter, MD   Meds Ordered and Administered this Visit  Medications - No data to display  BP 117/84 mmHg  Pulse 87  Temp(Src) 98.1 F (36.7 C) (Oral)  Resp 17  SpO2 98% No data found.   Physical Exam  Constitutional: She is oriented to person, place, and time. She appears well-developed and well-nourished. She appears distressed.  HENT:  Mouth/Throat: Oropharynx is clear and moist.  Neck: Normal range of motion. Neck supple.  Cardiovascular: Normal rate and normal heart sounds.   Pulmonary/Chest: Effort normal and breath sounds  normal.  Abdominal: Soft. Normal appearance and bowel sounds are normal. There is tenderness in the epigastric area.  Lymphadenopathy:    She has no cervical adenopathy.  Neurological: She is alert and oriented to person, place, and time.  Skin: Skin is warm and dry.  Nursing note and vitals reviewed.   ED Course  Procedures (including critical care time)  Labs Review Labs Reviewed - No data to display  Imaging Review No results found.   Visual Acuity Review  Right Eye Distance:   Left Eye Distance:   Bilateral Distance:    Right Eye Near:   Left Eye Near:    Bilateral Near:         MDM   1. Dysphagia, oropharyngeal  Sent for GI eval of painful reflux sx which she has had prev, Dr Rikki Spearing.    Billy Fischer, MD 03/12/15 [place], MD 03/12/15 [phone]

## 2015-03-12 NOTE — ED Notes (Signed)
[facility] via [facility], dx with GERD and transferred, VSS, CBG 303, A/O X4, ambulatory and in NAD, also cough  20g right hand

## 2015-03-12 NOTE — ED Notes (Signed)
[facility]

## 2015-03-12 NOTE — Discharge Instructions (Signed)
Make sure that you eat small bites and eat slowly. Take Prilosec OTC as directed. Keep your scheduled appointment with Dr. Henrene Pastor next month. Return if you feel worse for any reason your blood sugar is mildly elevated today at 339

## 2015-03-12 NOTE — ED Notes (Signed)
She c/o 2 day history of midsternal CP. Pain increased with eating or drinking,states when she does try to eat or drink she vomits it back up. States it "feels like something is stuck in my chest." She reports SOB also. She is A&O, breathing eeasily

## 2015-03-12 NOTE — ED Provider Notes (Signed)
CSN: [id number]     Arrival date & time 03/12/15  1541 History   First MD Initiated Contact with Patient 03/12/15 [phone]     Chief Complaint  Patient presents with  . Gastroesophageal Reflux     (Consider location/radiation/quality/duration/timing/severity/associated sxs/prior Treatment) HPI Complete a burning pain at epigastric area nonradiating typical of acid reflux she's had in the past. Pain onset 2 days ago. She's vomited 3 times since onset of pain. She last ate at 7:30 AM today. She feels that food is stuck and epigastric area. Denies vomiting blood. No other associated symptoms. No shortness of breath no nausea no sweatiness pain is worse with attempting to eat, nonexertional. No treatment prior to coming here. Seen by Dr.Kindl to coming here, sent here for further evaluation. Past Medical History  Diagnosis Date  . Diabetes mellitus   . Asthma   . Hypertension   . Obesity   . Arthritis   . H/O tubal ligation   . Sleep apnea     uses CPAP  . Anxiety   . Depression   . Fracture closed of upper end of forearm 9 years, Fx left left leg  . Fracture of left lower leg @ 50 years old  . Fatty liver   . Positive H. pylori test   . Tubular adenoma of colon   GE reflux disease Past Surgical History  Procedure Laterality Date  . Cholecystectomy    . Tubal ligation Bilateral   . Colonoscopy with propofol N/A 08/15/2013    Procedure: COLONOSCOPY WITH PROPOFOL;  Surgeon: Jerene Bears, MD;  Location: [facility];  Service: Gastroenterology;  Laterality: N/A;  . Esophagogastroduodenoscopy (egd) with propofol N/A 08/15/2013    Procedure: ESOPHAGOGASTRODUODENOSCOPY (EGD) WITH PROPOFOL;  Surgeon: Jerene Bears, MD;  Location: [facility];  Service: Gastroenterology;  Laterality: N/A;  . Esophagogastroduodenoscopy N/A 05/12/2014    Procedure: ESOPHAGOGASTRODUODENOSCOPY (EGD);  Surgeon: Jerene Bears, MD;  Location: [facility];  Service: Gastroenterology;  Laterality: N/A;   Family History   Problem Relation Age of Onset  . Diabetes Mother   . Stroke Mother   . Hypertension Mother   . Cancer Sister    Social History  Substance Use Topics  . Smoking status: Current Every Day Smoker -- 0.25 packs/day    Types: Cigarettes  . Smokeless tobacco: Never Used  . Alcohol Use: No   OB History    Gravida Para Term Preterm AB TAB SAB Ectopic Multiple Living   3 3 2 1     1 4      Review of Systems  Gastrointestinal: Positive for nausea and abdominal pain.  All other systems reviewed and are negative.     Allergies  Review of patient's allergies indicates no known allergies.  Home Medications   Prior to Admission medications   Medication Sig Start Date End Date Taking? Authorizing Provider  Albiglutide 30 MG PEN Inject 30 mg into the skin once a week.    Historical Provider, MD  buPROPion (WELLBUTRIN XL) 300 MG 24 hr tablet Take 1 tablet by mouth 2 (two) times daily. 04/17/14   Historical Provider, MD  Canagliflozin (INVOKANA PO) Take by mouth.    Historical Provider, MD  clonazePAM (KLONOPIN) 1 MG tablet Take 1 mg by mouth at bedtime.    Historical Provider, MD  DULoxetine (CYMBALTA) 60 MG capsule Take 120 mg by mouth every morning.     Historical Provider, MD  fluticasone (FLONASE) 50 MCG/ACT nasal spray Place 2 sprays into both  nostrils daily. 11/26/13   Lorayne Marek, MD  glucose blood (CHOICE DM FORA G20 TEST STRIPS) test strip Use as instructed 07/09/14   Tresa Garter, MD  hydrocortisone cream 0.5 % Apply 1 application topically 2 (two) times daily. 02/25/14   Tiffany Daneil Dan, PA-C  hydrOXYzine (VISTARIL) 25 MG capsule Take 4 capsules (100 mg total) by mouth 3 (three) times daily as needed for itching. 02/25/14   Brayton Caves, PA-C  insulin aspart (NOVOLOG) 100 UNIT/ML FlexPen Inject 3 Units into the skin 3 (three) times daily with meals. 01/28/14   Lorayne Marek, MD  Insulin Glargine (LANTUS SOLOSTAR) 100 UNIT/ML Solostar Pen Inject 15 Units into the skin at  bedtime. 11/12/14   Boykin Nearing, MD  Insulin Syringe-Needle U-100 31G X 5/16" 0.3 ML MISC Use with solostar pen to inject 4 times daily 03/13/14   Lorayne Marek, MD  Lancets (ACCU-CHEK MULTICLIX) lancets Use as instructed 07/10/14   Lorayne Marek, MD  metFORMIN (GLUCOPHAGE) 1000 MG tablet Take 1 tablet (1,000 mg total) by mouth 2 (two) times daily with a meal. 07/09/14   Tresa Garter, MD  metoCLOPramide (REGLAN) 10 MG tablet Take 1 tablet (10 mg total) by mouth 4 (four) times daily -  before meals and at bedtime. 05/06/14   Jerene Bears, MD  mirtazapine (REMERON) 15 MG tablet Take 1 tablet by mouth daily. 04/17/14   Historical Provider, MD  NEEDLE, DISP, 24 G (B-D DISP NEEDLE 24GX1") 24G X 1" MISC Use with solostar pen to inject 4 times daily 07/09/14   Tresa Garter, MD  nystatin cream (MYCOSTATIN) Apply 1 application topically 2 (two) times daily. 10/01/14   Tresa Garter, MD  pantoprazole (PROTONIX) 40 MG tablet Take 1 tablet (40 mg total) by mouth 2 (two) times daily before a meal. 05/12/14   Jerene Bears, MD  sucralfate (CARAFATE) 1 GM/10ML suspension Take 1 g by mouth 4 (four) times daily.    Historical Provider, MD  triamcinolone cream (KENALOG) 0.1 % Apply 1 application topically 2 (two) times daily. 10/01/14   Tresa Garter, MD   BP 126/76 mmHg  Pulse 80  Temp(Src) 98.1 F (36.7 C) (Oral)  Resp 18  SpO2 99% Physical Exam  Constitutional: She appears well-developed and well-nourished.  HENT:  Head: Normocephalic and atraumatic.  Eyes: Conjunctivae are normal. Pupils are equal, round, and reactive to light.  Neck: Neck supple. No tracheal deviation present. No thyromegaly present.  Cardiovascular: Normal rate and regular rhythm.   No murmur heard. Pulmonary/Chest: Effort normal and breath sounds normal.  Abdominal: Soft. Bowel sounds are normal. She exhibits no distension. There is tenderness.  Morbidly obese, tender in epigastrium  Musculoskeletal: Normal range of  motion. She exhibits no edema or tenderness.  Neurological: She is alert. Coordination normal.  Skin: Skin is warm and dry. No rash noted.  Psychiatric: She has a normal mood and affect.  Nursing note and vitals reviewed.   ED Course  Procedures (including critical care time) Labs Review Labs Reviewed  COMPREHENSIVE METABOLIC PANEL  CBC  LIPASE, BLOOD    Imaging Review No results found. I have personally reviewed and evaluated these images and lab results as part of my medical decision-making.   EKG Interpretation None     Patient given a sip of water which he vomited immediately. Intravenous glucagon administered. 4:30 PM she continues to complain of burning epigastric area and mild nausea however no longer has foreign body sensation at epigastrium  ED ECG REPORT   Date: 03/12/2015  Rate: 85  Rhythm: normal sinus rhythm  QRS Axis: normal  Intervals: normal  ST/T Wave abnormalities: nonspecific T wave changes  Conduction Disutrbances:none  Narrative Interpretation:   Old EKG Reviewed: unchanged No change from 11/18/2013 I have personally reviewed the EKG tracing and agree with the computerized printout as noted. After treatment with intravenous glucagon patient no longer had foreign body sensation at epigastrium. She did complain of mild nausea. She became asymptomatic after treatment with intravenous Reglan, and was able to drink without difficulty. At 8 PM she remains asymptomatic and swallowed potassium tablets without difficulty. Chest x-ray viewed by me. Results for orders placed or performed during the hospital encounter of 03/12/15  Comprehensive metabolic panel  Result Value Ref Range   Sodium 136 135 - 145 mmol/L   Potassium 3.3 (L) 3.5 - 5.1 mmol/L   Chloride 97 (L) 101 - 111 mmol/L   CO2 28 22 - 32 mmol/L   Glucose, Bld 339 (H) 65 - 99 mg/dL   BUN <5 (L) 6 - 20 mg/dL   Creatinine, Ser 0.76 0.44 - 1.00 mg/dL   Calcium 9.3 8.9 - 10.3 mg/dL   Total  Protein 6.8 6.5 - 8.1 g/dL   Albumin 3.1 (L) 3.5 - 5.0 g/dL   AST 63 (H) 15 - 41 U/L   ALT 61 (H) 14 - 54 U/L   Alkaline Phosphatase 178 (H) 38 - 126 U/L   Total Bilirubin 0.3 0.3 - 1.2 mg/dL   GFR calc non Af Amer >60 >60 mL/min   GFR calc Af Amer >60 >60 mL/min   Anion gap 11 5 - 15  CBC  Result Value Ref Range   WBC 6.3 4.0 - 10.5 K/uL   RBC 4.90 3.87 - 5.11 MIL/uL   Hemoglobin 12.9 12.0 - 15.0 g/dL   HCT 41.2 36.0 - 46.0 %   MCV 84.1 78.0 - 100.0 fL   MCH 26.3 26.0 - 34.0 pg   MCHC 31.3 30.0 - 36.0 g/dL   RDW 13.4 11.5 - 15.5 %   Platelets 248 150 - 400 K/uL  Lipase, blood  Result Value Ref Range   Lipase 27 11 - 51 U/L   Dg Chest 2 View  03/12/2015  CLINICAL DATA:  Chest pain. EXAM: CHEST  2 VIEW COMPARISON:  November 18, 2013. FINDINGS: The heart size and mediastinal contours are within normal limits. Both lungs are clear. No pneumothorax or pleural effusion is noted. The visualized skeletal structures are unremarkable. IMPRESSION: No active cardiopulmonary disease. Electronically Signed   By: Marijo Conception, M.D.   On: 03/12/2015 16:48    MDM  I suspect the patient may have had esophageal food impaction which has resolved. Plan patient encouraged to eats in small bites. Prilosec OTC. She has an appointment scheduled with Dr. Henrene Pastor, her gastroenterologist April 2017 Final diagnoses:  None  Diagnoses #1 epigastric pain #2 hyperglycemia #3 hypokalemia      Orlie Dakin, MD 03/12/15 2010

## 2015-04-12 ENCOUNTER — Telehealth: Payer: Self-pay | Admitting: Internal Medicine

## 2015-04-12 ENCOUNTER — Encounter: Payer: Medicaid Other | Admitting: Internal Medicine

## 2015-04-12 NOTE — Telephone Encounter (Signed)
Pt. Called stating she had to cancel her appointment today with her new PCP but had to cancel it because her daughter just passed away. Pt. Would like to know if she can be prescribed a Rx for her anxiety. Please f/u

## 2015-04-13 ENCOUNTER — Telehealth: Payer: Self-pay

## 2015-04-13 ENCOUNTER — Other Ambulatory Visit: Payer: Self-pay

## 2015-04-13 MED ORDER — BUPROPION HCL ER (XL) 300 MG PO TB24: 300.0000 mg | ORAL_TABLET | Freq: Two times a day (BID) | ORAL | Status: DC

## 2015-04-13 NOTE — Telephone Encounter (Signed)
Called patient back Patient is needing the medication for her depression refilled thanks

## 2015-04-13 NOTE — Telephone Encounter (Signed)
Spoke with tiffany noel NP and it is ok to refill wellbutrin  For patient at this time RX sent to [facility] on file

## 2015-04-27 ENCOUNTER — Encounter: Payer: Self-pay | Admitting: *Deleted

## 2015-05-07 ENCOUNTER — Ambulatory Visit: Payer: Medicaid Other | Admitting: Internal Medicine

## 2015-05-07 ENCOUNTER — Telehealth: Payer: Self-pay | Admitting: *Deleted

## 2015-05-07 NOTE — Telephone Encounter (Signed)
No show letter mailed to patient. 

## 2015-07-06 ENCOUNTER — Other Ambulatory Visit (HOSPITAL_COMMUNITY)
Admission: RE | Admit: 2015-07-06 | Discharge: 2015-07-06 | Disposition: A | Discharge status: Home / Self Care | Payer: Medicaid Other | Source: Ambulatory Visit | Attending: Internal Medicine | Admitting: Internal Medicine

## 2015-07-06 ENCOUNTER — Ambulatory Visit: Payer: Medicaid Other | Attending: Internal Medicine | Admitting: Internal Medicine

## 2015-07-06 ENCOUNTER — Encounter: Payer: Self-pay | Admitting: Internal Medicine

## 2015-07-06 VITALS — BP 124/79 | HR 102 | Temp 97.9°F | Wt 298.8 lb

## 2015-07-06 DIAGNOSIS — F419 Anxiety disorder, unspecified: Secondary | ICD-10-CM | POA: Insufficient documentation

## 2015-07-06 DIAGNOSIS — Z23 Encounter for immunization: Secondary | ICD-10-CM | POA: Diagnosis not present

## 2015-07-06 DIAGNOSIS — Z9851 Tubal ligation status: Secondary | ICD-10-CM | POA: Diagnosis not present

## 2015-07-06 DIAGNOSIS — E139 Other specified diabetes mellitus without complications: Secondary | ICD-10-CM | POA: Diagnosis not present

## 2015-07-06 DIAGNOSIS — Z823 Family history of stroke: Secondary | ICD-10-CM | POA: Insufficient documentation

## 2015-07-06 DIAGNOSIS — F17210 Nicotine dependence, cigarettes, uncomplicated: Secondary | ICD-10-CM | POA: Diagnosis not present

## 2015-07-06 DIAGNOSIS — B379 Candidiasis, unspecified: Secondary | ICD-10-CM

## 2015-07-06 DIAGNOSIS — G4733 Obstructive sleep apnea (adult) (pediatric): Secondary | ICD-10-CM

## 2015-07-06 DIAGNOSIS — L304 Erythema intertrigo: Secondary | ICD-10-CM | POA: Insufficient documentation

## 2015-07-06 DIAGNOSIS — Z794 Long term (current) use of insulin: Secondary | ICD-10-CM

## 2015-07-06 DIAGNOSIS — F32A Depression, unspecified: Secondary | ICD-10-CM

## 2015-07-06 DIAGNOSIS — Z113 Encounter for screening for infections with a predominantly sexual mode of transmission: Secondary | ICD-10-CM | POA: Diagnosis not present

## 2015-07-06 DIAGNOSIS — Z9989 Dependence on other enabling machines and devices: Secondary | ICD-10-CM

## 2015-07-06 DIAGNOSIS — B372 Candidiasis of skin and nail: Secondary | ICD-10-CM | POA: Diagnosis not present

## 2015-07-06 DIAGNOSIS — Z79899 Other long term (current) drug therapy: Secondary | ICD-10-CM | POA: Insufficient documentation

## 2015-07-06 DIAGNOSIS — F329 Major depressive disorder, single episode, unspecified: Secondary | ICD-10-CM | POA: Diagnosis not present

## 2015-07-06 DIAGNOSIS — E119 Type 2 diabetes mellitus without complications: Secondary | ICD-10-CM

## 2015-07-06 DIAGNOSIS — E1165 Type 2 diabetes mellitus with hyperglycemia: Secondary | ICD-10-CM | POA: Diagnosis not present

## 2015-07-06 DIAGNOSIS — Z7984 Long term (current) use of oral hypoglycemic drugs: Secondary | ICD-10-CM | POA: Diagnosis not present

## 2015-07-06 DIAGNOSIS — Z809 Family history of malignant neoplasm, unspecified: Secondary | ICD-10-CM | POA: Diagnosis not present

## 2015-07-06 DIAGNOSIS — Z833 Family history of diabetes mellitus: Secondary | ICD-10-CM | POA: Insufficient documentation

## 2015-07-06 DIAGNOSIS — F411 Generalized anxiety disorder: Secondary | ICD-10-CM

## 2015-07-06 LAB — GLUCOSE, POCT (MANUAL RESULT ENTRY): POC Glucose: 208 mg/dl — AB (ref 70–99)

## 2015-07-06 LAB — POCT GLYCOSYLATED HEMOGLOBIN (HGB A1C): HEMOGLOBIN A1C: 12.1

## 2015-07-06 MED ORDER — TETANUS-DIPHTH-ACELL PERTUSSIS 5-2.5-18.5 LF-MCG/0.5 IM SUSP: 0.5000 mL | Freq: Once | INTRAMUSCULAR | Status: AC

## 2015-07-06 MED ORDER — INSULIN ASPART 100 UNIT/ML FLEXPEN: 7.0000 [IU] | PEN_INJECTOR | Freq: Three times a day (TID) | SUBCUTANEOUS | Status: DC

## 2015-07-06 MED ORDER — ALBIGLUTIDE 30 MG ~~LOC~~ PEN: 30.0000 mg | PEN_INJECTOR | SUBCUTANEOUS | Status: DC

## 2015-07-06 MED ORDER — INVOKANA 300 MG PO TABS: 300.0000 mg | ORAL_TABLET | Freq: Every day | ORAL | Status: DC

## 2015-07-06 MED ORDER — FLUCONAZOLE 150 MG PO TABS: 150.0000 mg | ORAL_TABLET | Freq: Once | ORAL | Status: DC

## 2015-07-06 MED ORDER — INSULIN GLARGINE 100 UNIT/ML SOLOSTAR PEN: 20.0000 [IU] | PEN_INJECTOR | Freq: Every day | SUBCUTANEOUS | Status: DC

## 2015-07-06 MED ORDER — NYSTATIN 100000 UNIT/GM EX POWD: Freq: Four times a day (QID) | CUTANEOUS | Status: DC

## 2015-07-06 MED ORDER — METFORMIN HCL 1000 MG PO TABS: 1000.0000 mg | ORAL_TABLET | Freq: Two times a day (BID) | ORAL | Status: DC

## 2015-07-06 MED ORDER — RANITIDINE HCL 150 MG PO TABS: 150.0000 mg | ORAL_TABLET | Freq: Two times a day (BID) | ORAL | Status: DC

## 2015-07-06 NOTE — Patient Instructions (Addendum)
[facility] w[facility].   Diabetes Mellitus and Food It is important for you to manage your blood sugar (glucose) level. Your blood glucose level can be greatly affected by what you eat. Eating healthier foods in the appropriate amounts throughout the day at about the same time each day will help you control your blood glucose level. It can also help slow or prevent worsening of your diabetes mellitus. Healthy eating may even help you improve the level of your blood pressure and reach or maintain a healthy weight.  General recommendations for healthful eating and cooking habits include:  Eating meals and snacks regularly. Avoid going long periods of time without eating to lose weight.  Eating a diet that consists mainly of plant-based foods, such as fruits, vegetables, nuts, legumes, and whole grains.  Using low-heat cooking methods, such as baking, instead of high-heat cooking methods, such as deep frying. Work with your dietitian to make sure you understand how to use the Nutrition Facts information on food labels. HOW CAN FOOD AFFECT ME? Carbohydrates Carbohydrates affect your blood glucose level more than any other type of food. Your dietitian will help you determine how many carbohydrates to eat at each meal and teach you how to count carbohydrates. Counting carbohydrates is important to keep your blood glucose at a healthy level, especially if you are using insulin or taking certain medicines for diabetes mellitus. Alcohol Alcohol can cause sudden decreases in blood glucose (hypoglycemia), especially if you use insulin or take certain medicines for diabetes mellitus. Hypoglycemia can be a life-threatening condition. Symptoms of hypoglycemia (sleepiness, dizziness, and disorientation) are similar to symptoms of having too much alcohol.  If your health care provider has given you approval to drink alcohol, do so in moderation and use the following guidelines:  Women should not have  more than one drink per day, and men should not have more than two drinks per day. One drink is equal to:  12 oz of beer.  5 oz of wine.  1 oz of hard liquor.  Do not drink on an empty stomach.  Keep yourself hydrated. Have water, diet soda, or unsweetened iced tea.  Regular soda, juice, and other mixers might contain a lot of carbohydrates and should be counted. WHAT FOODS ARE NOT RECOMMENDED? As you make food choices, it is important to remember that all foods are not the same. Some foods have fewer nutrients per serving than other foods, even though they might have the same number of calories or carbohydrates. It is difficult to get your body what it needs when you eat foods with fewer nutrients. Examples of foods that you should avoid that are high in calories and carbohydrates but low in nutrients include:  Trans fats (most processed foods list trans fats on the Nutrition Facts label).  Regular soda.  Juice.  Candy.  Sweets, such as cake, pie, doughnuts, and cookies.  Fried foods. WHAT FOODS CAN I EAT? Eat nutrient-rich foods, which will nourish your body and keep you healthy. The food you should eat also will depend on several factors, including:  The calories you need.  The medicines you take.  Your weight.  Your blood glucose level.  Your blood pressure level.  Your cholesterol level. You should eat a variety of foods, including:  Protein.  Lean cuts of meat.  Proteins low in saturated fats, such as fish, egg whites, and beans. Avoid processed meats.  Fruits and vegetables.  Fruits and vegetables that may help control  blood glucose levels, such as apples, mangoes, and yams.  Dairy products.  Choose fat-free or low-fat dairy products, such as milk, yogurt, and cheese.  Grains, bread, pasta, and rice.  Choose whole grain products, such as multigrain bread, whole oats, and brown rice. These foods may help control blood pressure.  Fats.  Foods  containing healthful fats, such as nuts, avocado, olive oil, canola oil, and fish. DOES EVERYONE WITH DIABETES MELLITUS HAVE THE SAME MEAL PLAN? Because every person with diabetes mellitus is different, there is not one meal plan that works for everyone. It is very important that you meet with a dietitian who will help you create a meal plan that is just right for you.   This information is not intended to replace advice given to you by your health care provider. Make sure you discuss any questions you have with your health care provider.   Document Released: 09/22/2004 Document Revised: 01/16/2014 Document Reviewed: 11/22/2012 Elsevier Interactive Patient Education 2016 [place] for Eating Away From Home If You Have Diabetes Controlling your level of blood glucose, also known as blood sugar, can be challenging. It can be even more difficult when you do not prepare your own meals. The following tips can help you manage your diabetes when you eat away from home. PLANNING AHEAD Plan ahead if you know you will be eating away from home:  Ask your health care provider how to time meals and medicine if you are taking insulin.  Make a list of restaurants near you that offer healthy choices. If they have a carry-out menu, take it home and plan what you will order ahead of time.  Look up the restaurant you want to eat at online. Many chain and fast-food restaurants list nutritional information online. Use this information to choose the healthiest options and to calculate how many carbohydrates will be in your meal.  Use a carbohydrate-counting book or mobile app to look up the carbohydrate content and serving size of the foods you want to eat.  Become familiar with serving sizes and learn to recognize how many servings are in a portion. This will allow you to estimate how many carbohydrates you can eat. FREE FOODS A "free food" is any food or drink that has less than 5 g of carbohydrates  per serving. Free foods include:  Many vegetables.  Hard boiled eggs.  Nuts or seeds.  Olives.  Cheeses.  Meats. These types of foods make good appetizer choices and are often available at salad bars. Lemon juice, vinegar, or a low-calorie salad dressing of fewer than 20 calories per serving can be used as a "free" salad dressing.  CHOICES TO REDUCE CARBOHYDRATES  Substitute nonfat sweetened yogurt with a sugar-free yogurt. Yogurt made from soy milk may also be used, but you will still want a sugar-free or plain option to choose a lower carbohydrate amount.  Ask your server to take away the bread basket or chips from your table.  Order fresh fruit. A salad bar often offers fresh fruit choices. Avoid canned fruit because it is usually packed in sugar or syrup.  Order a salad, and eat it without dressing. Or, create a "free" salad dressing.  Ask for substitutions. For example, instead of [place] fries, request an order of a vegetable such as salad, green beans, or broccoli. OTHER TIPS   If you take insulin, take the insulin once your food arrives to your table. This will ensure your insulin and food are  timed correctly.  Ask your server about the portion size before your order, and ask for a take-out box if the portion has more servings than you should have. When your food comes, leave the amount you should have on the plate, and put the rest in the take-out box.  Consider splitting an entree with someone and ordering a side salad.   This information is not intended to replace advice given to you by your health care provider. Make sure you discuss any questions you have with your health care provider.   Document Released: 12/26/2004 Document Revised: 09/16/2014 Document Reviewed: 03/25/2013 Elsevier Interactive Patient Education 2016 [employer]. .  - Diabetes and Exercise Exercising regularly is important. It is not just about losing weight. It has many health benefits, such  as:  Improving your overall fitness, flexibility, and endurance.  Increasing your bone density.  Helping with weight control.  Decreasing your body fat.  Increasing your muscle strength.  Reducing stress and tension.  Improving your overall health. People with diabetes who exercise gain additional benefits because exercise:  Reduces appetite.  Improves the body's use of blood sugar (glucose).  Helps lower or control blood glucose.  Decreases blood pressure.  Helps control blood lipids (such as cholesterol and triglycerides).  Improves the body's use of the hormone insulin by:  Increasing the body's insulin sensitivity.  Reducing the body's insulin needs.  Decreases the risk for heart disease because exercising:  Lowers cholesterol and triglycerides levels.  Increases the levels of good cholesterol (such as high-density lipoproteins [HDL]) in the body.  Lowers blood glucose levels. YOUR ACTIVITY PLAN  Choose an activity that you enjoy, and set realistic goals. To exercise safely, you should begin practicing any new physical activity slowly, and gradually increase the intensity of the exercise over time. Your health care provider or diabetes educator can help create an activity plan that works for you. General recommendations include:  Encouraging children to engage in at least 60 minutes of physical activity each day.  Stretching and performing strength training exercises, such as yoga or weight lifting, at least 2 times per week.  Performing a total of at least 150 minutes of moderate-intensity exercise each week, such as brisk walking or water aerobics.  Exercising at least 3 days per week, making sure you allow no more than 2 consecutive days to pass without exercising.  Avoiding long periods of inactivity (90 minutes or more). When you have to spend an extended period of time sitting down, take frequent breaks to walk or stretch. RECOMMENDATIONS FOR EXERCISING  WITH TYPE 1 OR TYPE 2 DIABETES   Check your blood glucose before exercising. If blood glucose levels are greater than 240 mg/dL, check for urine ketones. Do not exercise if ketones are present.  Avoid injecting insulin into areas of the body that are going to be exercised. For example, avoid injecting insulin into:  The arms when playing tennis.  The legs when jogging.  Keep a record of:  Food intake before and after you exercise.  Expected peak times of insulin action.  Blood glucose levels before and after you exercise.  The type and amount of exercise you have done.  Review your records with your health care provider. Your health care provider will help you to develop guidelines for adjusting food intake and insulin amounts before and after exercising.  If you take insulin or oral hypoglycemic agents, watch for signs and symptoms of hypoglycemia. They include:  Dizziness.  Shaking.  Sweating.  Chills.  Confusion.  Drink plenty of water while you exercise to prevent dehydration or heat stroke. Body water is lost during exercise and must be replaced.  Talk to your health care provider before starting an exercise program to make sure it is safe for you. Remember, almost any type of activity is better than none.   This information is not intended to replace advice given to you by your health care provider. Make sure you discuss any questions you have with your health care provider.   Document Released: 03/18/2003 Document Revised: 05/12/2014 Document Reviewed: 06/04/2012 Elsevier Interactive Patient Education 2016 [employer].   Tdap Vaccine (Tetanus, Diphtheria and Pertussis): What You Need to Know   1. Why get vaccinated? Tetanus, diphtheria and pertussis are very serious diseases. Tdap vaccine can protect [name] from these diseases. And, Tdap vaccine given to pregnant women can protect newborn babies against pertussis. TETANUS (Lockjaw) is rare in the [place]  today. It causes painful muscle tightening and stiffness, usually all over the body.  It can lead to tightening of muscles in the head and neck so you can't open your mouth, swallow, or sometimes even breathe. Tetanus kills about 1 out of 10 people who are infected even after receiving the best medical care. DIPHTHERIA is also rare in the [place] today. It can cause a thick coating to form in the back of the throat.  It can lead to breathing problems, heart failure, paralysis, and death. PERTUSSIS (Whooping Cough) causes severe coughing spells, which can cause difficulty breathing, vomiting and disturbed sleep.  It can also lead to weight loss, incontinence, and rib fractures. Up to 2 in 100 adolescents and 5 in 100 adults with pertussis are hospitalized or have complications, which could include pneumonia or death. These diseases are caused by bacteria. Diphtheria and pertussis are spread from person to person through secretions from coughing or sneezing. Tetanus enters the body through cuts, scratches, or wounds. Before vaccines, as many as 200,000 cases of diphtheria, 200,000 cases of pertussis, and hundreds of cases of tetanus, were reported in the [place] each year. Since vaccination began, reports of cases for tetanus and diphtheria have dropped by about 99% and for pertussis by about 80%. 2. Tdap vaccine Tdap vaccine can protect adolescents and adults from tetanus, diphtheria, and pertussis. One dose of Tdap is routinely given at age 1 or 38. People who did not get Tdap at that age should get it as soon as possible. Tdap is especially important for healthcare professionals and anyone having close contact with a baby younger than 12 months. Pregnant women should get a dose of Tdap during every pregnancy, to protect the newborn from pertussis. Infants are most at risk for severe, life-threatening complications from pertussis. Another vaccine, called Td, protects against tetanus and  diphtheria, but not pertussis. A Td booster should be given every 10 years. Tdap may be given as one of these boosters if you have never gotten Tdap before. Tdap may also be given after a severe cut or burn to prevent tetanus infection. Your doctor or the person giving you the vaccine can give you more information. Tdap may safely be given at the same time as other vaccines. 3. Some people should not get this vaccine  A person who has ever had a life-threatening allergic reaction after a previous dose of any diphtheria, tetanus or pertussis containing vaccine, OR has a severe allergy to any part of this vaccine, should not get Tdap vaccine. Tell  the person giving the vaccine about any severe allergies.  Anyone who had coma or long repeated seizures within 7 days after a childhood dose of DTP or DTaP, or a previous dose of Tdap, should not get Tdap, unless a cause other than the vaccine was found. They can still get Td.  Talk to your doctor if you:  have seizures or another nervous system problem,  had severe pain or swelling after any vaccine containing diphtheria, tetanus or pertussis,  ever had a condition called Guillain-Barr Syndrome (GBS),  aren't feeling well on the day the shot is scheduled. 4. Risks With any medicine, including vaccines, there is a chance of side effects. These are usually mild and go away on their own. Serious reactions are also possible but are rare. Most people who get Tdap vaccine do not have any problems with it. Mild problems following Tdap (Did not interfere with activities)  Pain where the shot was given (about 3 in 4 adolescents or 2 in 3 adults)  Redness or swelling where the shot was given (about 1 person in 5)  Mild fever of at least 100.8F (up to about 1 in 25 adolescents or 1 in 100 adults)  Headache (about 3 or 4 people in 10)  Tiredness (about 1 person in 3 or 4)  Nausea, vomiting, diarrhea, stomach ache (up to 1 in 4 adolescents or 1 in 10  adults)  Chills, sore joints (about 1 person in 10)  Body aches (about 1 person in 3 or 4)  Rash, swollen glands (uncommon) Moderate problems following Tdap (Interfered with activities, but did not require medical attention)  Pain where the shot was given (up to 1 in 5 or 6)  Redness or swelling where the shot was given (up to about 1 in 16 adolescents or 1 in 12 adults)  Fever over 102F (about 1 in 100 adolescents or 1 in 250 adults)  Headache (about 1 in 7 adolescents or 1 in 10 adults)  Nausea, vomiting, diarrhea, stomach ache (up to 1 or 3 people in 100)  Swelling of the entire arm where the shot was given (up to about 1 in 500). Severe problems following Tdap (Unable to perform usual activities; required medical attention)  Swelling, severe pain, bleeding and redness in the arm where the shot was given (rare). Problems that could happen after any vaccine:  People sometimes faint after a medical procedure, including vaccination. Sitting or lying down for about 15 minutes can help prevent fainting, and injuries caused by a fall. Tell your doctor if you feel dizzy, or have vision changes or ringing in the ears.  Some people get severe pain in the shoulder and have difficulty moving the arm where a shot was given. This happens very rarely.  Any medication can cause a severe allergic reaction. Such reactions from a vaccine are very rare, estimated at fewer than 1 in a million doses, and would happen within a few minutes to a few hours after the vaccination. As with any medicine, there is a very remote chance of a vaccine causing a serious injury or death. The safety of vaccines is always being monitored. For more information, visit: http://www.aguilar.org/ 5. What if there is a serious problem? What should I look for?  Look for anything that concerns you, such as signs of a severe allergic reaction, very high fever, or unusual behavior.  Signs of a severe allergic reaction  can include hives, swelling of the face and throat, difficulty breathing, a  fast heartbeat, dizziness, and weakness. These would usually start a few minutes to a few hours after the vaccination. What should I do?  If you think it is a severe allergic reaction or other emergency that can't wait, call 9-1-1 or get the person to the nearest hospital. Otherwise, call your doctor.  Afterward, the reaction should be reported to the Vaccine Adverse Event Reporting System (VAERS). Your doctor might file this report, or you can do it yourself through the VAERS web site at www.vaers.SamedayNews.es, or by calling 3033484077. VAERS does not give medical advice.  6. The National Vaccine Injury Compensation Program The [employer] Vaccine Injury Compensation Program (VICP) is a federal program that was created to compensate people who may have been injured by certain vaccines. Persons who believe they may have been injured by a vaccine can learn about the program and about filing a claim by calling 336-615-8712 or visiting the [employer] website at GoldCloset.com.ee. There is a time limit to file a claim for compensation. 7. How can I learn more?  Ask your doctor. He or she can give you the vaccine package insert or suggest other sources of information.  Call your local or state health department.  Contact the Centers for Disease Control and Prevention (CDC):  Call 469 137 5842 (1-800-CDC-INFO) or  Visit CDC's website at http://hunter.com/ CDC Tdap Vaccine VIS (03/04/13)   This information is not intended to replace advice given to you by your health care provider. Make sure you discuss any questions you have with your health care provider.   Document Released: 06/27/2011 Document Revised: 01/16/2014 Document Reviewed: 04/09/2013 Elsevier Interactive Patient Education [employer].

## 2015-07-06 NOTE — Progress Notes (Signed)
Tara Keller, is a 50 y.o. female  [id number]  [id number]  DOB - 1965/01/30  CC:  Chief Complaint  Patient presents with  . Annual Exam       HPI: Tara Keller is a 49 y.o. female here today to establish medical care., last seen in clinic6/16. Since than, she has been seen in ED for chest pain and gerd.  She states it has been tough recently since the loss of her daughter in 2022-04-15 (apparently she died of natural causes in her sleep).  She states she is using her insulin, but does not recall what doses.  Diet has been off, but she has been trying to actively lose weight, thinks she has loss about 50lbs in the past year.  She states she had MM yesterday.   Eye exam about 1 year ago.  She asked for std screening as well, due to concern about exposure recently.  Denies any vaginal discharge.  Had papsmear last year which was negative.  Smokes 6-10 cigs/day, more if stressed. Not quite ready to stop smoking. Denies etoh.  She has CPAP for osa, but does not use. The full mask causes claustrophobia, she needs to call the company to get nasal mask.  Patient has No headache, No chest pain, No abdominal pain - No Nausea, No new weakness tingling or numbness, No Cough - SOB.    Review of Systems: Per HPI, o/w all systems reviewed and negative.   No Known Allergies Past Medical History  Diagnosis Date  . Diabetes mellitus   . Asthma   . Hypertension   . Obesity   . Arthritis   . H/O tubal ligation   . Sleep apnea     uses CPAP  . Anxiety   . Depression   . Fracture closed of upper end of forearm 9 years, Fx left left leg  . Fracture of left lower leg @ 50 years old  . Fatty liver   . Positive H. pylori test   . Tubular adenoma of colon   . Hiatal hernia    Current Outpatient Prescriptions on File Prior to Visit  Medication Sig Dispense Refill  . buPROPion (WELLBUTRIN XL) 300 MG 24 hr tablet Take 1 tablet (300 mg total) by mouth 2 (two) times daily. 60 tablet 0  .  clonazePAM (KLONOPIN) 1 MG tablet Take 1 mg by mouth at bedtime.    . DULoxetine (CYMBALTA) 60 MG capsule Take 120 mg by mouth every morning.     [name] glucose blood (CHOICE DM FORA G20 TEST STRIPS) test strip Use as instructed 100 each 12  . Insulin Syringe-Needle U-100 31G X 5/16" 0.3 ML MISC Use with solostar pen to inject 4 times daily 250 each 12  . Lancets (ACCU-CHEK MULTICLIX) lancets Use as instructed 100 each 12  . metFORMIN (GLUCOPHAGE) 1000 MG tablet Take 1 tablet (1,000 mg total) by mouth 2 (two) times daily with a meal. 60 tablet 3  . NEEDLE, DISP, 24 G (B-D DISP NEEDLE 24GX1") 24G X 1" MISC Use with solostar pen to inject 4 times daily 200 each 1  . Albiglutide 30 MG PEN Inject 30 mg into the skin once a week.    . Canagliflozin (INVOKANA PO) Take by mouth. Reported on 07/06/2015    . fluticasone (FLONASE) 50 MCG/ACT nasal spray Place 2 sprays into both nostrils daily. (Patient not taking: Reported on 03/12/2015) 16 g 6  . hydrocortisone cream 0.5 % Apply 1 application topically 2 (two) times  daily. (Patient not taking: Reported on 07/06/2015) 30 g 0  . hydrOXYzine (VISTARIL) 25 MG capsule Take 4 capsules (100 mg total) by mouth 3 (three) times daily as needed for itching. (Patient not taking: Reported on 07/06/2015) 45 capsule 0  . metoCLOPramide (REGLAN) 10 MG tablet Take 1 tablet (10 mg total) by mouth 4 (four) times daily -  before meals and at bedtime. 120 tablet 2  . mirtazapine (REMERON) 15 MG tablet Take 1 tablet by mouth daily.    [name] nystatin cream (MYCOSTATIN) Apply 1 application topically 2 (two) times daily. (Patient not taking: Reported on 07/06/2015) 30 g 3  . sucralfate (CARAFATE) 1 GM/10ML suspension Take 1 g by mouth 4 (four) times daily. Reported on 07/06/2015    . triamcinolone cream (KENALOG) 0.1 % Apply 1 application topically 2 (two) times daily. (Patient not taking: Reported on 07/06/2015) 30 g 3   No current facility-administered medications on file prior to visit.    Family History  Problem Relation Age of Onset  . Diabetes Mother   . Stroke Mother   . Hypertension Mother   . Cancer Sister    Social History   Social History  . Marital Status: Single    Spouse Name: N/A  . Number of Children: N/A  . Years of Education: N/A   Occupational History  . Not on file.   Social History Main Topics  . Smoking status: Current Every Day Smoker -- 0.25 packs/day    Types: Cigarettes  . Smokeless tobacco: Never Used  . Alcohol Use: No  . Drug Use: No  . Sexual Activity: Not on file   Other Topics Concern  . Not on file   Social History Narrative    Objective:   Filed Vitals:   07/06/15 1037  BP: 124/79  Pulse: 102  Temp: 97.9 F (36.6 C)    Filed Weights   07/06/15 1037  Weight: 298 lb 12.8 oz (135.535 kg)    BP Readings from Last 3 Encounters:  07/06/15 124/79  03/12/15 134/85  03/12/15 117/84   bmi 48.23  Physical Exam: Constitutional: Patient appears well-developed and well-nourished. No distress. AAOx3, morbid obese. HENT: Normocephalic, atraumatic, External right and left ear normal. Oropharynx is clear and moist.  Eyes: Conjunctivae and EOM are normal. PERRL, no scleral icterus. Neck: Normal ROM. Neck supple. No JVD.  CVS: RRR, S1/S2 +, no murmurs, no gallops, no carotid bruit.  Breast/axilla: no palpable masses, limited exam due to large breast size, +erythma/candida infection beneath bilat breast noted., no induration/abscesses. Pulmonary: Effort and breath sounds normal, no stridor, rhonchi, wheezes, rales.  Abdominal: Soft. BS +, obese, large panus, NO tenderness, rebound or guarding.  Musculoskeletal: Normal range of motion. No edema and no tenderness.  LE: bilat/ no c/c/e, pulses 2+ bilateral. Neuro: Alert.  muscle tone coordination wnl. No cranial nerve deficit grossly. Skin: Skin is warm and dry. No rash noted. Not diaphoretic. No erythema. No pallor. Psychiatric: Normal mood and affect. Behavior, judgment,  thought content normal.  Lab Results  Component Value Date   WBC 6.3 03/12/2015   HGB 12.9 03/12/2015   HCT 41.2 03/12/2015   MCV 84.1 03/12/2015   PLT 248 03/12/2015   Lab Results  Component Value Date   CREATININE 0.76 03/12/2015   BUN <5* 03/12/2015   NA 136 03/12/2015   K 3.3* 03/12/2015   CL 97* 03/12/2015   CO2 28 03/12/2015    Lab Results  Component Value Date   HGBA1C 12.1  07/06/2015   Lipid Panel     Component Value Date/Time   CHOL 127 08/15/2012 1058   TRIG 105 08/15/2012 1058   HDL 34* 08/15/2012 1058   CHOLHDL 3.7 08/15/2012 1058   VLDL 21 08/15/2012 1058   LDLCALC 72 08/15/2012 1058       Depression screen PHQ 2/9 07/06/2015 07/03/2014 06/01/2014 03/20/2014 01/06/2014  Decreased Interest 0 0 0 2 0  Down, Depressed, Hopeless 2 0 0 3 1  PHQ - 2 Score 2 0 0 5 1  Altered sleeping 0 - - 2 -  Tired, decreased energy 2 - - 3 -  Change in appetite 3 - - 2 -  Feeling bad or failure about yourself  1 - - 2 -  Trouble concentrating 2 - - 2 -  Moving slowly or fidgety/restless 0 - - 2 -  Suicidal thoughts 0 - - 0 -  PHQ-9 Score 10 - - 18 -  Difficult doing work/chores Very difficult - - - -    Assessment and plan:   1.diabetes mellitus 2, uncontrolled, w/  long-term current use of insulin  - suspect dietary noncompliance, and possible medication noncompliance given did not know her doses. - insulin aspart (NOVOLOG) 100 UNIT/ML FlexPen; Inject 7 Units into the skin 3 (three) times daily with meals.  Dispense: 15 mL; Refill: 11 - renewed metoformin, albiglutide and invokana today, but not certain if she is actually taking. - POCT glucose (manual entry) 208 - POCT glycosylated hemoglobin (Hb A1C)  12.1 (from 10.6 on 06/26/14) - Microalbumin/Creatinine Ratio, Urine - Ambulatory referral to Ophthalmology - annual eye exam - low carb diet/exercise discussed, info given today. - f/u w/ [facility] 2 wks for dm chk, encouraged pt to chk CBG 4x day until sees  her.  2. Need for Tdap vaccination - Tdap (BOOSTRIX) injection 0.5 mL; Inject 0.5 mLs into the muscle once.  3. Screen for STD (sexually transmitted disease) - HIV antibody (with reflex) - Urine cytology ancillary only  4. Anxiety/depression  - pt sees Dr Ron Agee psychiatry at [facility], has f/u appt next month she says. - defer meds to psyche, she is on cymbalta, klonopin, wellbutrin.  5. Candidiasis, intertrigo, under breast - nystatin powder, diflucan 150mg  po x 1  6. Health maintance - up to day on MM, papsmear, and colonoscopy.  7. tob abuse  - encouraged complete cessation.  Return in about 3 months (around 10/06/2015).  The patient was given clear instructions to go to ER or return to medical center if symptoms don't improve, worsen or new problems develop. The patient verbalized understanding. The patient was told to call to get lab results if they haven't heard anything in the next week.    This note has been created with Surveyor, quantity. Any transcriptional errors are unintentional.   Maren Reamer, MD, [place], [place]   07/06/2015, 11:58 AM

## 2015-07-07 LAB — URINE CYTOLOGY ANCILLARY ONLY
Chlamydia: NEGATIVE
NEISSERIA GONORRHEA: NEGATIVE
TRICH (WINDOWPATH): NEGATIVE

## 2015-07-07 LAB — MICROALBUMIN / CREATININE URINE RATIO
Creatinine, Urine: 113 mg/dL (ref 20–320)
MICROALB/CREAT RATIO: 14 ug/mg{creat} (ref <30)
Microalb, Ur: 1.6 mg/dL

## 2015-07-07 LAB — HIV ANTIBODY (ROUTINE TESTING W REFLEX): HIV: NONREACTIVE

## 2015-07-07 NOTE — Addendum Note (Signed)
Addended byLottie Mussel T on: 07/07/2015 12:42 PM   Modules accepted: Miquel Dunn

## 2015-07-20 ENCOUNTER — Ambulatory Visit: Payer: Medicaid Other | Admitting: Pharmacist

## 2015-07-22 LAB — HM DIABETES EYE EXAM

## 2015-07-27 ENCOUNTER — Ambulatory Visit: Payer: Medicaid Other | Attending: Internal Medicine | Admitting: Pharmacist

## 2015-07-27 DIAGNOSIS — E139 Other specified diabetes mellitus without complications: Secondary | ICD-10-CM | POA: Diagnosis not present

## 2015-07-27 MED ORDER — INSULIN ASPART 100 UNIT/ML FLEXPEN: 8.0000 [IU] | PEN_INJECTOR | Freq: Three times a day (TID) | SUBCUTANEOUS | Status: DC

## 2015-07-27 NOTE — Patient Instructions (Signed)
Thanks for coming!  Increase Novolog to 8 units with each meal.  Crystal Light is a great choice over soda, also try to drink more water.  Check your blood sugar first thing when you wake up before you eat. Then also check about 2 hours after you eat. This will help [name] see where you really are.  Try to get 2-3 readings a day, knowing that some days it might be harder than others.  Come back in 1 week to see how things are going.

## 2015-07-27 NOTE — Progress Notes (Signed)
    S:    Patient arrives in good spirits.  Presents for diabetes evaluation, education, and management at the request of Dr. Janne Napoleon. Patient was referred on 07/06/15.  Patient was last seen by Primary Care Provider on 07/06/15.    Patient reports adherence with medications.  Current diabetes medications include: Lantus 20 units daily, Novolog 7 units TID, Invokana 300 mg daily, Tanzeum 30 mg weekly, and metformin 1000 mg BID.  Patient denies hypoglycemic events.  Patient reported dietary habits: drinks a lot a regular soda  Patient reported exercise habits: tries to move around with ADLs and is looking to be more active.   Patient reports nocturia x 1.  Patient denies neuropathy. Patient reports chronic visual changes and is following with an eye doctor. Patient reports self foot exams.   O:  Lab Results  Component Value Date   HGBA1C 12.1 07/06/2015   There were no vitals filed for this visit.  2 hour post-prandial/random CBG: 204-342  A/P: Diabetes longstanding currently UNcontrolled based on A1c of 12.1. Patient denies hypoglycemic events and is able to verbalize appropriate hypoglycemia management plan. Patient reports adherence with medication. Control is suboptimal due to sedentary lifestyle and dietary indiscretion.  Continued basal insulin Lantus (insulin glargine) at 20 units daily. Increased dose of rapid insulin Novolog (insulin aspart) to 8 units prior to each meal. Continued Tanzeum, metformin, and Invokana. Reviewed lifestyle changes, such as increased activity and decreased soda intake - patient is looking to switch to [employer]. Will be conservative in medication changes as I expect stopping soda intake will greatly reduce her hyperglycemia.  Next A1C anticipated October 2017.    Written patient instructions provided.  Total time in face to face counseling 20 minutes.   Follow up in Pharmacist Clinic Visit in 1 week.   Patient seen with Joellyn Haff, PharmD  Candidate

## 2015-08-02 ENCOUNTER — Encounter: Payer: Self-pay | Admitting: Internal Medicine

## 2015-08-03 ENCOUNTER — Ambulatory Visit: Payer: Medicaid Other | Admitting: Pharmacist

## 2015-08-05 ENCOUNTER — Ambulatory Visit: Payer: Medicaid Other | Admitting: Pharmacist

## 2015-08-10 ENCOUNTER — Telehealth: Payer: Self-pay

## 2015-08-11 ENCOUNTER — Ambulatory Visit: Payer: Medicaid Other | Attending: Internal Medicine | Admitting: Pharmacist

## 2015-08-11 ENCOUNTER — Encounter: Payer: Self-pay | Admitting: Pharmacist

## 2015-08-11 DIAGNOSIS — E139 Other specified diabetes mellitus without complications: Secondary | ICD-10-CM | POA: Diagnosis not present

## 2015-08-11 MED ORDER — INSULIN GLARGINE 100 UNIT/ML SOLOSTAR PEN: 24.0000 [IU] | PEN_INJECTOR | Freq: Every day | SUBCUTANEOUS | 2 refills | Status: DC

## 2015-08-11 MED ORDER — INSULIN ASPART 100 UNIT/ML FLEXPEN: 9.0000 [IU] | PEN_INJECTOR | Freq: Three times a day (TID) | SUBCUTANEOUS | 11 refills | Status: DC

## 2015-08-11 MED ORDER — "INSULIN SYRINGE-NEEDLE U-100 31G X 15/64"" 0.5 ML MISC": 5 refills | Status: DC

## 2015-08-11 NOTE — Telephone Encounter (Signed)
-----   Message from Maren Reamer, MD sent at 07/13/2015 11:54 PM EDT ----- Please call w/ results.  Neg hiv screening, neg std screening.  Keep working on the diabetes!  Looks like so far no microscopic DM damage to the kidneys.  thanks  Aim for 30 minutes of exercise most days. Rethink what you drink. Water is great! Aim for 2-3 Carb Choices per meal (30-45 grams) +/- 1 either way.  Aim for 0-15 Carbs per snack if hungry.  Include protein in moderation with your meals and snacks.  Consider reading food labels for Total Carbohydrate and Fat Grams of foods  Consider checking blood glucose (accuchecks/BG) at alternate times per day.  Continue taking medication as directed. Be mindful about how much sugar you are adding to beverages and other foods. Fruit Punch - find one with no sugar  Measure and decrease portions of carbohydrate foods. Make your plate and don't go back for seconds.

## 2015-08-11 NOTE — Progress Notes (Signed)
    S:    Patient arrives in good spirits.  Presents for diabetes evaluation, education, and management at the request of Dr. Janne Napoleon. Patient was referred on 07/06/15.  Patient was last seen by Primary Care Provider on 07/06/15.    Patient reports adherence with medications.  Current diabetes medications include: Lantus 20 units daily, Novolog 7 units TID, Invokana 300 mg daily, Tanzeum 30 mg weekly, and metformin 1000 mg BID.  Patient denies hypoglycemic events.  Patient reported dietary habits: has cut out soda and now drinks Crystal Light but does have a regular PowerAde with her during the visit.   Patient reported exercise habits: tries to move around with ADLs and is looking to be more active.   Patient reports nocturia x 1.  Patient denies neuropathy. Patient reports chronic visual changes and is following with an eye doctor. Patient reports self foot exams.   O:  Lab Results  Component Value Date   HGBA1C 12.1 07/06/2015   There were no vitals filed for this visit.  2 hour post-prandial/random CBG: 174-300s (average = low 200s)  A/P: Diabetes longstanding currently UNcontrolled based on A1c of 12.1 but is improving based on home CBGs. Patient denies hypoglycemic events and is able to verbalize appropriate hypoglycemia management plan. Patient reports adherence with medication but did not increase Novolog to 8 units TID like instructed to last time. Control is suboptimal due to sedentary lifestyle and dietary indiscretion.  Increased basal insulin Lantus (insulin glargine) to 24 units daily. Increased dose of rapid insulin Novolog (insulin aspart) to 9 units prior to each meal. Discontinued Tanzeum due to history of pancreatitis and therefore increased risk of recurrent pancreatitis. May need to continue to titrate insulin since Tanzeum has been discontinued.  Continued metformin and Invokana.   Next A1C anticipated October 2017.    Written patient instructions provided.   Total time in face to face counseling 20 minutes.   Follow up in Pharmacist Clinic Visit in 1 week.   Patient seen with Shellee Milo, PharmD Candidate.

## 2015-08-11 NOTE — Patient Instructions (Signed)
Thanks for coming to see [name]!  Stop the Tanzeum.  Increase Lantus to 24 units a day.  Increase Novolog to 9 units before meals.  Remember to check your blood sugar first thing when you wake up, before you eat.  Try to check your blood sugar 3-4 times a day while we are trying to get it under control  Schedule an appointment with Dr. Janne Napoleon in 2 weeks.   Hypoglycemia Low blood sugar (hypoglycemia) means that the level of sugar in your blood is lower than it should be. Signs of low blood sugar include:  Getting sweaty.  Feeling hungry.  Feeling dizzy or weak.  Feeling sleepier than normal.  Feeling nervous.  Headaches.  Having a fast heartbeat. Low blood sugar can happen fast and can be an emergency. Your doctor can do tests to check your blood sugar level. You can have low blood sugar and not have diabetes. HOME CARE  Check your blood sugar as told by your doctor. If it is less than 70 mg/dl or as told by your doctor, take 1 of the following:  3 to 4 glucose tablets.   cup clear juice.   cup soda pop, not diet.  1 cup milk.  5 to 6 hard candies.  Recheck blood sugar after 15 minutes. Repeat until it is at the right level.  Eat a snack if it is more than 1 hour until the next meal.  Only take medicine as told by your doctor.  Do not skip meals. Eat on time.  Do not drink alcohol except with meals.  Check your blood glucose before driving.  Check your blood glucose before and after exercise.  Always carry treatment with you, such as glucose pills.  Always wear a medical alert bracelet if you have diabetes. GET HELP RIGHT AWAY IF:   Your blood glucose goes below 70 mg/dl or as told by your doctor, and you:  Are confused.  Are not able to swallow.  Pass out (faint).  You cannot treat yourself. You may need someone to help you.  You have low blood sugar problems often.  You have problems from your medicines.  You are not feeling better  after 3 to 4 days.  You have vision changes. MAKE SURE YOU:   Understand these instructions.  Will watch this condition.  Will get help right away if you are not doing well or get worse.   This information is not intended to replace advice given to you by your health care provider. Make sure you discuss any questions you have with your health care provider.   Document Released: 03/22/2009 Document Revised: 01/16/2014 Document Reviewed: 09/01/2014 Elsevier Interactive Patient Education [employer].

## 2015-08-11 NOTE — Telephone Encounter (Signed)
Clld pt - advsd of lab results; went over recommendations with pt as well. Pt stated she understood.

## 2015-08-29 ENCOUNTER — Emergency Department (HOSPITAL_COMMUNITY): Payer: Medicaid Other

## 2015-08-29 ENCOUNTER — Encounter (HOSPITAL_COMMUNITY): Payer: Self-pay

## 2015-08-29 ENCOUNTER — Emergency Department (HOSPITAL_COMMUNITY)
Admission: EM | Admit: 2015-08-29 | Discharge: 2015-08-30 | Disposition: A | Discharge status: Home / Self Care | Payer: Medicaid Other | Attending: Emergency Medicine | Admitting: Emergency Medicine

## 2015-08-29 DIAGNOSIS — I10 Essential (primary) hypertension: Secondary | ICD-10-CM | POA: Insufficient documentation

## 2015-08-29 DIAGNOSIS — Z7984 Long term (current) use of oral hypoglycemic drugs: Secondary | ICD-10-CM | POA: Diagnosis not present

## 2015-08-29 DIAGNOSIS — Z794 Long term (current) use of insulin: Secondary | ICD-10-CM | POA: Diagnosis not present

## 2015-08-29 DIAGNOSIS — R1013 Epigastric pain: Secondary | ICD-10-CM

## 2015-08-29 DIAGNOSIS — J45909 Unspecified asthma, uncomplicated: Secondary | ICD-10-CM | POA: Insufficient documentation

## 2015-08-29 DIAGNOSIS — E119 Type 2 diabetes mellitus without complications: Secondary | ICD-10-CM | POA: Diagnosis not present

## 2015-08-29 DIAGNOSIS — F17210 Nicotine dependence, cigarettes, uncomplicated: Secondary | ICD-10-CM | POA: Insufficient documentation

## 2015-08-29 DIAGNOSIS — Z79899 Other long term (current) drug therapy: Secondary | ICD-10-CM | POA: Insufficient documentation

## 2015-08-29 DIAGNOSIS — Z86010 Personal history of colonic polyps: Secondary | ICD-10-CM | POA: Diagnosis not present

## 2015-08-29 LAB — COMPREHENSIVE METABOLIC PANEL
ALT: 35 U/L (ref 14–54)
ANION GAP: 8 (ref 5–15)
AST: 30 U/L (ref 15–41)
Albumin: 3.4 g/dL — ABNORMAL LOW (ref 3.5–5.0)
Alkaline Phosphatase: 166 U/L — ABNORMAL HIGH (ref 38–126)
BUN: 5 mg/dL — ABNORMAL LOW (ref 6–20)
CHLORIDE: 100 mmol/L — AB (ref 101–111)
CO2: 28 mmol/L (ref 22–32)
CREATININE: 0.64 mg/dL (ref 0.44–1.00)
Calcium: 9.2 mg/dL (ref 8.9–10.3)
Glucose, Bld: 252 mg/dL — ABNORMAL HIGH (ref 65–99)
POTASSIUM: 3.4 mmol/L — AB (ref 3.5–5.1)
Sodium: 136 mmol/L (ref 135–145)
Total Bilirubin: 0.4 mg/dL (ref 0.3–1.2)
Total Protein: 7.6 g/dL (ref 6.5–8.1)

## 2015-08-29 LAB — LIPASE, BLOOD: Lipase: 123 U/L — ABNORMAL HIGH (ref 11–51)

## 2015-08-29 LAB — CBC
HCT: 44.6 % (ref 36.0–46.0)
HEMOGLOBIN: 14.3 g/dL (ref 12.0–15.0)
MCH: 28 pg (ref 26.0–34.0)
MCHC: 32.1 g/dL (ref 30.0–36.0)
MCV: 87.3 fL (ref 78.0–100.0)
PLATELETS: 321 10*3/uL (ref 150–400)
RBC: 5.11 MIL/uL (ref 3.87–5.11)
RDW: 13.4 % (ref 11.5–15.5)
WBC: 10.2 10*3/uL (ref 4.0–10.5)

## 2015-08-29 MED ORDER — SODIUM CHLORIDE 0.9 % IV BOLUS (SEPSIS)
1000.0000 mL | Freq: Once | INTRAVENOUS | Status: AC
  Administered 2015-08-29: 1000 mL via INTRAVENOUS

## 2015-08-29 MED ORDER — MORPHINE SULFATE (PF) 4 MG/ML IV SOLN
4.0000 mg | Freq: Once | INTRAVENOUS | Status: AC
Start: 2015-08-29 — End: 2015-08-29
  Administered 2015-08-29: 4 mg via INTRAVENOUS
  Filled 2015-08-29: qty 1

## 2015-08-29 MED ORDER — PANTOPRAZOLE SODIUM 40 MG PO TBEC
40.0000 mg | DELAYED_RELEASE_TABLET | Freq: Every day | ORAL | Status: DC
  Administered 2015-08-29: 40 mg via ORAL
  Filled 2015-08-29: qty 1

## 2015-08-29 MED ORDER — HYDROCODONE-ACETAMINOPHEN 5-325 MG PO TABS: 2.0000 | ORAL_TABLET | ORAL | 0 refills | Status: DC | PRN

## 2015-08-29 MED ORDER — PANTOPRAZOLE SODIUM 40 MG PO TBEC
80.0000 mg | DELAYED_RELEASE_TABLET | Freq: Once | ORAL | Status: DC
  Filled 2015-08-29: qty 2

## 2015-08-29 MED ORDER — GI COCKTAIL ~~LOC~~
30.0000 mL | Freq: Once | ORAL | Status: AC
  Administered 2015-08-29: 30 mL via ORAL
  Filled 2015-08-29: qty 30

## 2015-08-29 MED ORDER — MORPHINE SULFATE (PF) 4 MG/ML IV SOLN
4.0000 mg | Freq: Once | INTRAVENOUS | Status: AC
  Administered 2015-08-29: 4 mg via INTRAVENOUS
  Filled 2015-08-29: qty 1

## 2015-08-29 MED ORDER — PANTOPRAZOLE SODIUM 40 MG PO TBEC: 40.0000 mg | DELAYED_RELEASE_TABLET | Freq: Every day | ORAL | 0 refills | Status: DC

## 2015-08-29 NOTE — ED Provider Notes (Signed)
[facility] DEPT Provider Note   CSN: [id number] Arrival date & time: 08/29/15  0915     History   Chief Complaint Chief Complaint  Patient presents with  . Abdominal Pain    HPI Tara Keller is a 50 y.o. female who presents with epigastric pain for the past 4 days. PMH significant for GERD, morbid obesity, hiatal hernia, DM with possible gastroparesis, fatty liver with elevated transaminases, sleep apnea, +H. Pylori, depression/anxiety. Past surgical hx of cholecystectomy. Over the past 4 days she has had worsening epigastric pain which at first was with food intake but is now constant. It radiates to the RUQ. She feels that she isn't able to swallow and the food gets stuck and she will have emesis 30 min after a meal. Emesis looks like undigested food. She is able to tolerate liquids. She currently takes Prilosec OTC 1QD prn. Prevously followed by Dr. Hilarie Fredrickson who did an EGD and Barium swallow. EGD in 05/2014 showed moderate-severe reflux with a 3cm hiatal hernia. He recommended to increase her Protonix 40mg  BID for 8 weeks, lifestyle modifications, and Reglan. Barium swallow did now show esophageal stricture or ulceration. She has not followed up with him since that time but has been seeing her PCP. She was seen in 03/2015 for the same. She was diagnosed with possible esophageal food impaction and given IV glucagon with good relief. D/c with GI follow up and Pepcid daily. She has been taking Pepcid but has not followed up with GI.  Of note she has had elevated lipase before and had a MRA with and without contrast and MRCP which showed fatty liver but was otherwise unremarkable. Currently she denies fever, chills, chest pain, SOB, lower abdominal pain, nausea, constipation/diarrhea, irritative voiding symptoms.  HPI  Past Medical History:  Diagnosis Date  . Anxiety   . Arthritis   . Asthma   . Depression   . Diabetes mellitus   . Fatty liver   . Fracture closed of upper end of forearm 9  years, Fx left left leg  . Fracture of left lower leg @ 50 years old  . H/O tubal ligation   . Hiatal hernia   . Hypertension   . Obesity   . Positive H. pylori test   . Sleep apnea    uses CPAP  . Tubular adenoma of colon     Patient Active Problem List   Diagnosis Date Noted  . OSA on CPAP 07/06/2015  . Depression (emotion) 07/06/2015  . Abnormal barium swallow   . Abdominal pain, epigastric 08/15/2013  . Benign neoplasm of colon 08/15/2013  . Special screening for malignant neoplasms, colon 08/15/2013  . Hepatic steatosis 05/19/2013  . History of pancreatitis 05/19/2013  . Family history of colon cancer 05/19/2013  . GERD (gastroesophageal reflux disease) 02/18/2013  . Smoking 02/18/2013  . Morbid obesity ([place]) 05/28/2012  . Type II or unspecified type diabetes mellitus without mention of complication, not stated as uncontrolled 05/28/2012  . Osteoarthritis of left and right knee 05/28/2012  . Generalized anxiety disorder 05/28/2012    Past Surgical History:  Procedure Laterality Date  . CHOLECYSTECTOMY    . COLONOSCOPY WITH PROPOFOL N/A 08/15/2013   Procedure: COLONOSCOPY WITH PROPOFOL;  Surgeon: Jerene Bears, MD;  Location: [facility];  Service: Gastroenterology;  Laterality: N/A;  . ESOPHAGOGASTRODUODENOSCOPY N/A 05/12/2014   Procedure: ESOPHAGOGASTRODUODENOSCOPY (EGD);  Surgeon: Jerene Bears, MD;  Location: [facility];  Service: Gastroenterology;  Laterality: N/A;  . ESOPHAGOGASTRODUODENOSCOPY (EGD) WITH PROPOFOL  N/A 08/15/2013   Procedure: ESOPHAGOGASTRODUODENOSCOPY (EGD) WITH PROPOFOL;  Surgeon: Jerene Bears, MD;  Location: [facility];  Service: Gastroenterology;  Laterality: N/A;  . TUBAL LIGATION Bilateral     OB History    Gravida Para Term Preterm AB Living   3 3 2 1   4    SAB TAB Ectopic Multiple Live Births         1         Home Medications    Prior to Admission medications   Medication Sig Start Date End Date Taking? Authorizing Provider    buPROPion (WELLBUTRIN XL) 300 MG 24 hr tablet Take 1 tablet (300 mg total) by mouth 2 (two) times daily. 04/13/15   Tiffany Daneil Dan, PA-C  clonazePAM (KLONOPIN) 1 MG tablet Take 1 mg by mouth at bedtime.    Historical Provider, MD  DULoxetine (CYMBALTA) 60 MG capsule Take 120 mg by mouth every morning.     Historical Provider, MD  fluconazole (DIFLUCAN) 150 MG tablet Take 1 tablet (150 mg total) by mouth once. 07/06/15   Maren Reamer, MD  glucose blood (CHOICE DM FORA G20 TEST STRIPS) test strip Use as instructed 07/09/14   Tresa Garter, MD  hydrOXYzine (VISTARIL) 25 MG capsule Take 4 capsules (100 mg total) by mouth 3 (three) times daily as needed for itching. Patient not taking: Reported on 07/06/2015 02/25/14   Brayton Caves, PA-C  insulin aspart (NOVOLOG) 100 UNIT/ML FlexPen Inject 9 Units into the skin 3 (three) times daily with meals. 08/11/15   Tresa Garter, MD  Insulin Glargine (LANTUS SOLOSTAR) 100 UNIT/ML Solostar Pen Inject 24 Units into the skin at bedtime. 08/11/15   Tresa Garter, MD  Insulin Syringe-Needle U-100 (BD INSULIN SYRINGE ULTRAFINE) 31G X 15/64" 0.5 ML MISC Use as directed 08/11/15   Tresa Garter, MD  INVOKANA 300 MG TABS tablet Take 1 tablet (300 mg total) by mouth daily. 07/06/15   Maren Reamer, MD  Lancets (ACCU-CHEK MULTICLIX) lancets Use as instructed 07/10/14   Lorayne Marek, MD  metFORMIN (GLUCOPHAGE) 1000 MG tablet Take 1 tablet (1,000 mg total) by mouth 2 (two) times daily with a meal. 07/06/15   Maren Reamer, MD  metoCLOPramide (REGLAN) 10 MG tablet Take 1 tablet (10 mg total) by mouth 4 (four) times daily -  before meals and at bedtime. 05/06/14   Jerene Bears, MD  mirtazapine (REMERON) 15 MG tablet Take 1 tablet by mouth daily. 04/17/14   Historical Provider, MD  NEEDLE, DISP, 24 G (B-D DISP NEEDLE 24GX1") 24G X 1" MISC Use with solostar pen to inject 4 times daily 07/09/14   Tresa Garter, MD  nystatin (MYCOSTATIN/NYSTOP) powder Apply  topically 4 (four) times daily. 07/06/15   Maren Reamer, MD  ranitidine (ZANTAC) 150 MG tablet Take 1 tablet (150 mg total) by mouth 2 (two) times daily. 07/06/15   Maren Reamer, MD  sucralfate (CARAFATE) 1 GM/10ML suspension Take 1 g by mouth 4 (four) times daily. Reported on 07/06/2015    Historical Provider, MD    Family History Family History  Problem Relation Age of Onset  . Diabetes Mother   . Stroke Mother   . Hypertension Mother   . Cancer Sister     Social History Social History  Substance Use Topics  . Smoking status: Current Every Day Smoker    Packs/day: 0.25    Types: Cigarettes  . Smokeless tobacco: Never Used  .  Alcohol use No     Allergies   Review of patient's allergies indicates no known allergies.   Review of Systems Review of Systems  Constitutional: Negative for chills and fever.  HENT: Positive for trouble swallowing.   Respiratory: Negative for shortness of breath.   Cardiovascular: Negative for chest pain.  Gastrointestinal: Positive for abdominal pain and vomiting. Negative for blood in stool, constipation, diarrhea and nausea.  Genitourinary: Negative for dysuria.     Physical Exam Updated Vital Signs BP 106/71   Pulse 71   Temp 98 F (36.7 C) (Oral)   Resp 10   SpO2 98%   Physical Exam  Constitutional: She is oriented to person, place, and time. She appears well-developed and well-nourished. No distress.  Morbidly obese; uncomfortable in pain  HENT:  Head: Normocephalic and atraumatic.  Eyes: Conjunctivae are normal. Pupils are equal, round, and reactive to light. Right eye exhibits no discharge. Left eye exhibits no discharge. No scleral icterus.  Neck: Normal range of motion. Neck supple.  Cardiovascular: Normal rate and regular rhythm.  Exam reveals no gallop and no friction rub.   No murmur heard. Pulmonary/Chest: Effort normal and breath sounds normal. No respiratory distress. She has no wheezes. She has no rales. She  exhibits no tenderness.  Abdominal: Soft. Bowel sounds are normal. She exhibits no distension and no mass. There is tenderness. There is no rebound and no guarding. No hernia.  Epigastric pain with mild RUQ pain  Musculoskeletal: She exhibits no edema.  Neurological: She is alert and oriented to person, place, and time.  Skin: Skin is warm and dry.  Psychiatric: She has a normal mood and affect. Her behavior is normal.  Nursing note and vitals reviewed.    ED Treatments / Results  Labs (all labs ordered are listed, but only abnormal results are displayed) Labs Reviewed  LIPASE, BLOOD - Abnormal; Notable for the following:       Result Value   Lipase 123 (*)    All other components within normal limits  COMPREHENSIVE METABOLIC PANEL - Abnormal; Notable for the following:    Potassium 3.4 (*)    Chloride 100 (*)    Glucose, Bld 252 (*)    BUN 5 (*)    Albumin 3.4 (*)    Alkaline Phosphatase 166 (*)    All other components within normal limits  CBC    EKG  EKG Interpretation  Date/Time:  Sunday August 29 2015 10:44:40 EDT Ventricular Rate:  65 PR Interval:    QRS Duration: 88 QT Interval:  442 QTC Calculation: 460 R Axis:   32 Text Interpretation:  Sinus rhythm No significant change was found Confirmed by Florina Ou  MD, Jenny Reichmann ([id number]) on 08/30/2015 1:59:27 PM       Radiology Dg Chest 2 View  Result Date: 08/29/2015 CLINICAL DATA:  Chest pain for 4 days EXAM: CHEST  2 VIEW COMPARISON:  03/12/2015 FINDINGS: Cardiomediastinal silhouette is stable. No acute infiltrate or pleural effusion. No pulmonary edema. Mild degenerative changes lower thoracic spine. IMPRESSION: No active cardiopulmonary disease. Electronically Signed   By: Lahoma Crocker M.D.   On: 08/29/2015 10:35   US Abdomen Limited Ruq  Result Date: 08/29/2015 CLINICAL DATA:  Epigastric pain for several days EXAM: US ABDOMEN LIMITED - RIGHT UPPER QUADRANT COMPARISON:  11/18/2013 FINDINGS: Gallbladder: Surgically removed  Common bile duct: Diameter: 4 mm Liver: Mild increased echogenicity is noted consistent with fatty infiltration. This is stable from previous CT from 2015 IMPRESSION: Fatty liver.  Status post cholecystectomy. Electronically Signed   By: Inez Catalina M.D.   On: 08/29/2015 17:08    Procedures Procedures (including critical care time)  Medications Ordered in ED Medications  gi cocktail (Maalox,Lidocaine,Donnatal) (30 mLs Oral Given 08/29/15 1038)  morphine 4 MG/ML injection 4 mg (4 mg Intravenous Given 08/29/15 1143)  morphine 4 MG/ML injection 4 mg (4 mg Intravenous Given 08/29/15 1327)  sodium chloride 0.9 % bolus 1,000 mL (0 mLs Intravenous Stopped 08/29/15 1530)     Initial Impression / Assessment and Plan / ED Course  I have reviewed the triage vital signs and the nursing notes.  Pertinent labs & imaging results that were available during my care of the patient were reviewed by me and considered in my medical decision making (see chart for details).  Clinical Course   50 year old female presents with epigastric pain and dysphagia. Possibly GERD vs food impaction vs stricture. Patient is afebrile, not tachycardic or tachypneic, normotensive, and not hypoxic. CBC unremarkable. CMP remarkable for mild hypokalemia, elevated glucose of 252, AP 166 which appears to be chronically elevated. Lipase is 123. GI cocktail and Morphine given for pain and IVF given.   RUQ US shows fatty liver but no duct dilation. This also could be gastroparesis as she has uncontrolled blood sugars however she is not nauseous, she is on Reglan, and her main complaint is pain and the feeling of food getting stuck. On recheck she states her pain is much better. Will d/c with trial of Protonix and GI follow up for further work up as outpatient. Patient is NAD, non-toxic, with stable VS. Patient is informed of clinical course, understands medical decision making process, and agrees with plan. Opportunity for questions provided  and all questions answered. Return precautions given.    Final Clinical Impressions(s) / ED Diagnoses   Final diagnoses:  Epigastric pain    New Prescriptions Discharge Medication List as of 08/29/2015  5:15 PM    START taking these medications   Details  HYDROcodone-acetaminophen (NORCO/VICODIN) 5-325 MG tablet Take 2 tablets by mouth every 4 (four) hours as needed., Starting Sun 08/29/2015, Print    pantoprazole (PROTONIX) 40 MG tablet Take 1 tablet (40 mg total) by mouth daily., Starting Sun 08/29/2015, Print         Recardo Evangelist, PA-C 08/31/15 1008    Julianne Rice, MD 09/01/15 [phone]

## 2015-08-29 NOTE — ED Triage Notes (Signed)
Pt. C/O of abdominal pain for the last 4 days in the upper central area. Sts she can't lay down and sleep and already takes medicine for reflux. Pain is 10/10

## 2015-08-29 NOTE — ED Notes (Signed)
D/C instructions reviewed and pt verbalizes understanding of medications and follow up care and in minimal pain

## 2015-08-29 NOTE — ED Notes (Signed)
Urine not needed for this visit  Urinalysis not collected '

## 2015-08-29 NOTE — Discharge Instructions (Signed)
Soft diet until you see Dr. Hilarie Fredrickson

## 2015-08-30 ENCOUNTER — Encounter: Payer: Self-pay | Admitting: Internal Medicine

## 2015-08-30 ENCOUNTER — Ambulatory Visit: Payer: Medicaid Other | Attending: Internal Medicine | Admitting: Internal Medicine

## 2015-08-30 VITALS — BP 117/82 | HR 62 | Temp 98.4°F | Resp 16 | Wt 298.4 lb

## 2015-08-30 DIAGNOSIS — E1143 Type 2 diabetes mellitus with diabetic autonomic (poly)neuropathy: Secondary | ICD-10-CM | POA: Diagnosis not present

## 2015-08-30 DIAGNOSIS — F17210 Nicotine dependence, cigarettes, uncomplicated: Secondary | ICD-10-CM | POA: Diagnosis not present

## 2015-08-30 DIAGNOSIS — Z79899 Other long term (current) drug therapy: Secondary | ICD-10-CM | POA: Insufficient documentation

## 2015-08-30 DIAGNOSIS — G4733 Obstructive sleep apnea (adult) (pediatric): Secondary | ICD-10-CM | POA: Insufficient documentation

## 2015-08-30 DIAGNOSIS — M549 Dorsalgia, unspecified: Secondary | ICD-10-CM | POA: Insufficient documentation

## 2015-08-30 DIAGNOSIS — K219 Gastro-esophageal reflux disease without esophagitis: Secondary | ICD-10-CM | POA: Insufficient documentation

## 2015-08-30 DIAGNOSIS — Z720 Tobacco use: Secondary | ICD-10-CM | POA: Diagnosis not present

## 2015-08-30 DIAGNOSIS — E119 Type 2 diabetes mellitus without complications: Secondary | ICD-10-CM

## 2015-08-30 DIAGNOSIS — K3184 Gastroparesis: Secondary | ICD-10-CM

## 2015-08-30 DIAGNOSIS — Z794 Long term (current) use of insulin: Secondary | ICD-10-CM | POA: Diagnosis not present

## 2015-08-30 LAB — POCT GLYCOSYLATED HEMOGLOBIN (HGB A1C): HEMOGLOBIN A1C: 9.8

## 2015-08-30 LAB — GLUCOSE, POCT (MANUAL RESULT ENTRY): POC Glucose: 261 mg/dl — AB (ref 70–99)

## 2015-08-30 MED ORDER — METOCLOPRAMIDE HCL 10 MG PO TABS: 10.0000 mg | ORAL_TABLET | Freq: Four times a day (QID) | ORAL | 0 refills | Status: DC | PRN

## 2015-08-30 MED ORDER — METFORMIN HCL ER 500 MG PO TB24: 1000.0000 mg | ORAL_TABLET | Freq: Two times a day (BID) | ORAL | 2 refills | Status: DC

## 2015-08-30 MED ORDER — SUCRALFATE 1 GM/10ML PO SUSP: 1.0000 g | Freq: Three times a day (TID) | ORAL | 0 refills | Status: DC

## 2015-08-30 NOTE — Progress Notes (Signed)
Pt is in the office today for back pain Pt states she her pain level today in the office is a 7 Pt states her pain is coming from her abdomen and back Pt states she has been taking Vicodin that was prescribed to her from the emergency room Pt states she needs a referral to GI for her stomach issue Pt states the GI Dr is Zenovia Jarred

## 2015-08-30 NOTE — Patient Instructions (Signed)
Gastroesophageal Reflux Disease, Adult Normally, food travels down the esophagus and stays in the stomach to be digested. If a person has gastroesophageal reflux disease (GERD), food and stomach acid move back up into the esophagus. When this happens, the esophagus becomes sore and swollen (inflamed). Over time, GERD can make small holes (ulcers) in the lining of the esophagus. HOME CARE Diet  Follow a diet as told by your doctor. You may need to avoid foods and drinks such as:  Coffee and tea (with or without caffeine).  Drinks that contain alcohol.  Energy drinks and sports drinks.  Carbonated drinks or sodas.  Chocolate and cocoa.  Peppermint and mint flavorings.  Garlic and onions.  Horseradish.  Spicy and acidic foods, such as peppers, chili powder, curry powder, vinegar, hot sauces, and BBQ sauce.  Citrus fruit juices and citrus fruits, such as oranges, lemons, and limes.  Tomato-based foods, such as red sauce, chili, salsa, and pizza with red sauce.  Fried and fatty foods, such as donuts, french fries, potato chips, and high-fat dressings.  High-fat meats, such as hot dogs, rib eye steak, sausage, ham, and bacon.  High-fat dairy items, such as whole milk, butter, and cream cheese.  Eat small meals often. Avoid eating large meals.  Avoid drinking large amounts of liquid with your meals.  Avoid eating meals during the 2-3 hours before bedtime.  Avoid lying down right after you eat.  Do not exercise right after you eat. General Instructions  Pay attention to any changes in your symptoms.  Take over-the-counter and prescription medicines only as told by your doctor. Do not take aspirin, ibuprofen, or other NSAIDs unless your doctor says it is okay.  Do not use any tobacco products, including cigarettes, chewing tobacco, and e-cigarettes. If you need help quitting, ask your doctor.  Wear loose clothes. Do not wear anything tight around your waist.  Raise  (elevate) the head of your bed about 6 inches (15 cm).  Try to lower your stress. If you need help doing this, ask your doctor.  If you are overweight, lose an amount of weight that is healthy for you. Ask your doctor about a safe weight loss goal.  Keep all follow-up visits as told by your doctor. This is important. GET HELP IF:  You have new symptoms.  You lose weight and you do not know why it is happening.  You have trouble swallowing, or it hurts to swallow.  You have wheezing or a cough that keeps happening.  Your symptoms do not get better with treatment.  You have a hoarse voice. GET HELP RIGHT AWAY IF:  You have pain in your arms, neck, jaw, teeth, or back.  You feel sweaty, dizzy, or light-headed.  You have chest pain or shortness of breath.  You throw up (vomit) and your throw up looks like blood or coffee grounds.  You pass out (faint).  Your poop (stool) is bloody or black.  You cannot swallow, drink, or eat.   This information is not intended to replace advice given to you by your health care provider. Make sure you discuss any questions you have with your health care provider.   Document Released: 06/14/2007 Document Revised: 09/16/2014 Document Reviewed: 04/22/2014 Elsevier Interactive Patient Education 2016 Else[employer] Inc[employer]   -  Gastroparesis Gastroparesis, also called delayed gastric emptying, is a condition in which food takes longer than normal to empty from the stomach. The condition is usually long-lasting (chronic). CAUSES This condition may be caused  by:  An endocrine disorder, such as hypothyroidism or diabetes. Diabetes is the most common cause of this condition.  A nervous system disease, such as Parkinson disease or multiple sclerosis.  Cancer, infection, or surgery of the stomach or vagus nerve.  A connective tissue disorder, such as scleroderma.  Certain medicines. In most cases, the cause is not known. RISK FACTORS This  condition is more likely to develop in:  People with certain disorders, including endocrine disorders, eating disorders, amyloidosis, and scleroderma.  People with certain diseases, including Parkinson disease or multiple sclerosis.  People with cancer or infection of the stomach or vagus nerve.  People who have had surgery on the stomach or vagus nerve.  People who take certain medicines.  Women. SYMPTOMS Symptoms of this condition include:  An early feeling of fullness when eating.  Nausea.  Weight loss.  Vomiting.  Heartburn.  Abdominal bloating.  Inconsistent blood glucose levels.  Lack of appetite.  Acid from the stomach coming up into the esophagus (gastroesophageal reflux).  Spasms of the stomach. Symptoms may come and go. DIAGNOSIS This condition is diagnosed with tests, such as:  Tests that check how long it takes food to move through the stomach and intestines. These tests include:  Upper gastrointestinal (GI) series. In this test, X-rays of the intestines are taken after you drink a liquid. The liquid makes the intestines show up better on the X-rays.  Gastric emptying scintigraphy. In this test, scans are taken after you eat food that contains a small amount of radioactive material.  Wireless capsule GI monitoring system. This test involves swallowing a capsule that records information about movement through the stomach.  Gastric manometry. This test measures electrical and muscular activity in the stomach. It is done with a thin tube that is passed down the throat and into the stomach.  Endoscopy. This test checks for abnormalities in the lining of the stomach. It is done with a long, thin tube that is passed down the throat and into the stomach.  An ultrasound. This test can help rule out gallbladder disease or pancreatitis as a cause of your symptoms. It uses sound waves to take pictures of the inside of your body. TREATMENT There is no cure for  gastroparesis. This condition may be managed with:  Treatment of the underlying condition causing the gastroparesis.  Lifestyle changes, including exercise and dietary changes. Dietary changes can include:  Changes in what and when you eat.  Eating smaller meals more often.  Eating low-fat foods.  Eating low-fiber forms of high-fiber foods, such as cooked vegetables instead of raw vegetables.  Having liquid foods in place of solid foods. Liquid foods are easier to digest.  Medicines. These may be given to control nausea and vomiting and to stimulate stomach muscles.  Getting food through a feeding tube. This may be done in severe cases.  A gastric neurostimulator. This is a device that is inserted into the body with surgery. It helps improve stomach emptying and control nausea and vomiting. HOME CARE INSTRUCTIONS  Follow your health care provider's instructions about exercise and diet.  Take medicines only as directed by your health care provider. SEEK MEDICAL CARE IF:  Your symptoms do not improve with treatment.  You have new symptoms. SEEK IMMEDIATE MEDICAL CARE IF:  You have severe abdominal pain that does not improve with treatment.  You have nausea that does not go away.  You cannot keep fluids down.   This information is not intended to replace  advice given to you by your health care provider. Make sure you discuss any questions you have with your health care provider.   Document Released: 12/26/2004 Document Revised: 05/12/2014 Document Reviewed: 12/22/2013 Elsevier Interactive Patient Education 2016 Elsevier Inc.  - Diabetes Mellitus and Food It is important for you to manage your blood sugar (glucose) level. Your blood glucose level can be greatly affected by what you eat. Eating healthier foods in the appropriate amounts throughout the day at about the same time each day will help you control your blood glucose level. It can also help slow or prevent  worsening of your diabetes mellitus. Healthy eating may even help you improve the level of your blood pressure and reach or maintain a healthy weight.  General recommendations for healthful eating and cooking habits include:  Eating meals and snacks regularly. Avoid going long periods of time without eating to lose weight.  Eating a diet that consists mainly of plant-based foods, such as fruits, vegetables, nuts, legumes, and whole grains.  Using low-heat cooking methods, such as baking, instead of high-heat cooking methods, such as deep frying. Work with your dietitian to make sure you understand how to use the Nutrition Facts information on food labels. HOW CAN FOOD AFFECT ME? Carbohydrates Carbohydrates affect your blood glucose level more than any other type of food. Your dietitian will help you determine how many carbohydrates to eat at each meal and teach you how to count carbohydrates. Counting carbohydrates is important to keep your blood glucose at a healthy level, especially if you are using insulin or taking certain medicines for diabetes mellitus. Alcohol Alcohol can cause sudden decreases in blood glucose (hypoglycemia), especially if you use insulin or take certain medicines for diabetes mellitus. Hypoglycemia can be a life-threatening condition. Symptoms of hypoglycemia (sleepiness, dizziness, and disorientation) are similar to symptoms of having too much alcohol.  If your health care provider has given you approval to drink alcohol, do so in moderation and use the following guidelines:  Women should not have more than one drink per day, and men should not have more than two drinks per day. One drink is equal to:  12 oz of beer.  5 oz of wine.  1 oz of hard liquor.  Do not drink on an empty stomach.  Keep yourself hydrated. Have water, diet soda, or unsweetened iced tea.  Regular soda, juice, and other mixers might contain a lot of carbohydrates and should be  counted. WHAT FOODS ARE NOT RECOMMENDED? As you make food choices, it is important to remember that all foods are not the same. Some foods have fewer nutrients per serving than other foods, even though they might have the same number of calories or carbohydrates. It is difficult to get your body what it needs when you eat foods with fewer nutrients. Examples of foods that you should avoid that are high in calories and carbohydrates but low in nutrients include:  Trans fats (most processed foods list trans fats on the Nutrition Facts label).  Regular soda.  Juice.  Candy.  Sweets, such as cake, pie, doughnuts, and cookies.  Fried foods. WHAT FOODS CAN I EAT? Eat nutrient-rich foods, which will nourish your body and keep you healthy. The food you should eat also will depend on several factors, including:  The calories you need.  The medicines you take.  Your weight.  Your blood glucose level.  Your blood pressure level.  Your cholesterol level. You should eat a variety of foods, including:  Protein.  Lean cuts of meat.  Proteins low in saturated fats, such as fish, egg whites, and beans. Avoid processed meats.  Fruits and vegetables.  Fruits and vegetables that may help control blood glucose levels, such as apples, mangoes, and yams.  Dairy products.  Choose fat-free or low-fat dairy products, such as milk, yogurt, and cheese.  Grains, bread, pasta, and rice.  Choose whole grain products, such as multigrain bread, whole oats, and brown rice. These foods may help control blood pressure.  Fats.  Foods containing healthful fats, such as nuts, avocado, olive oil, canola oil, and fish. DOES EVERYONE WITH DIABETES MELLITUS HAVE THE SAME MEAL PLAN? Because every person with diabetes mellitus is different, there is not one meal plan that works for everyone. It is very important that you meet with a dietitian who will help you create a meal plan that is just right for you.    This information is not intended to replace advice given to you by your health care provider. Make sure you discuss any questions you have with your health care provider.   Document Released: 09/22/2004 Document Revised: 01/16/2014 Document Reviewed: 11/22/2012 Elsevier Interactive Patient Education 2016 [place] for Eating Away From Home If You Have Diabetes Controlling your level of blood glucose, also known as blood sugar, can be challenging. It can be even more difficult when you do not prepare your own meals. The following tips can help you manage your diabetes when you eat away from home. PLANNING AHEAD Plan ahead if you know you will be eating away from home:  Ask your health care provider how to time meals and medicine if you are taking insulin.  Make a list of restaurants near you that offer healthy choices. If they have a carry-out menu, take it home and plan what you will order ahead of time.  Look up the restaurant you want to eat at online. Many chain and fast-food restaurants list nutritional information online. Use this information to choose the healthiest options and to calculate how many carbohydrates will be in your meal.  Use a carbohydrate-counting book or mobile app to look up the carbohydrate content and serving size of the foods you want to eat.  Become familiar with serving sizes and learn to recognize how many servings are in a portion. This will allow you to estimate how many carbohydrates you can eat. FREE FOODS A "free food" is any food or drink that has less than 5 g of carbohydrates per serving. Free foods include:  Many vegetables.  Hard boiled eggs.  Nuts or seeds.  Olives.  Cheeses.  Meats. These types of foods make good appetizer choices and are often available at salad bars. Lemon juice, vinegar, or a low-calorie salad dressing of fewer than 20 calories per serving can be used as a "free" salad dressing.  CHOICES TO REDUCE  CARBOHYDRATES  Substitute nonfat sweetened yogurt with a sugar-free yogurt. Yogurt made from soy milk may also be used, but you will still want a sugar-free or plain option to choose a lower carbohydrate amount.  Ask your server to take away the bread basket or chips from your table.  Order fresh fruit. A salad bar often offers fresh fruit choices. Avoid canned fruit because it is usually packed in sugar or syrup.  Order a salad, and eat it without dressing. Or, create a "free" salad dressing.  Ask for substitutions. For example, instead of [place] fries, request an order of a  vegetable such as salad, green beans, or broccoli. OTHER TIPS   If you take insulin, take the insulin once your food arrives to your table. This will ensure your insulin and food are timed correctly.  Ask your server about the portion size before your order, and ask for a take-out box if the portion has more servings than you should have. When your food comes, leave the amount you should have on the plate, and put the rest in the take-out box.  Consider splitting an entree with someone and ordering a side salad.   This information is not intended to replace advice given to you by your health care provider. Make sure you discuss any questions you have with your health care provider.   Document Released: 12/26/2004 Document Revised: 09/16/2014 Document Reviewed: 03/25/2013 Elsevier Interactive Patient Education [employer].

## 2015-08-30 NOTE — Progress Notes (Signed)
Tara Keller, is a 50 y.o. female  [id number]  [id number]  DOB - [birth year]  Chief Complaint  Patient presents with  . Back Pain        Subjective:   Judi Large is a 50 y.o. female here today for a follow up visit of dm and gerd, possible gastroparesis, w/ significant Morbid obesity, OSA on CPAP .  Per pt, seen in Ed for GERD pain, worse, x 4 days. Food feels "stuck" in her throat, w/ nausea. Denies emesis, but pain severe, w/ associated back pain of same duration. Hard time getting comfortable to sleep.  Denies fever.  Seen in ED yesterday, started on protonix 40 qd and given Norco prn.  Pain persist, but bit better.  Eating only soups currently due to pain.  Use to see Dr Hilarie Fredrickson, who she is currently attempting to make appt w/ for f/u when I walked in room.  Declined flu shot.   Patient has No headache, No chest pain, No abdominal pain - No Nausea, No new weakness tingling or numbness, No Cough - SOB.  No problems updated.  ALLERGIES: No Known Allergies  PAST MEDICAL HISTORY: Past Medical History:  Diagnosis Date  . Anxiety   . Arthritis   . Asthma   . Depression   . Diabetes mellitus   . Fatty liver   . Fracture closed of upper end of forearm 9 years, Fx left left leg  . Fracture of left lower leg @ 50 years old  . H/O tubal ligation   . Hiatal hernia   . Hypertension   . Obesity   . Positive H. pylori test   . Sleep apnea    uses CPAP  . Tubular adenoma of colon     MEDICATIONS AT HOME: Prior to Admission medications   Medication Sig Start Date End Date Taking? Authorizing Provider  buPROPion (WELLBUTRIN XL) 150 MG 24 hr tablet Take 450 mg by mouth daily.   Yes Historical Provider, MD  clonazePAM (KLONOPIN) 1 MG tablet Take 1 mg by mouth at bedtime.   Yes Historical Provider, MD  DULoxetine (CYMBALTA) 60 MG capsule Take 60 mg by mouth 2 (two) times daily.    Yes Historical Provider, MD  glucose blood (CHOICE DM FORA G20 TEST STRIPS) test strip  Use as instructed Patient taking differently: 1 each by Other route See admin instructions. Check blood sugar 4 times daily 07/09/14  Yes Tresa Garter, MD  HYDROcodone-acetaminophen (NORCO/VICODIN) 5-325 MG tablet Take 2 tablets by mouth every 4 (four) hours as needed. 08/29/15  Yes Recardo Evangelist, PA-C  hydrOXYzine (VISTARIL) 25 MG capsule Take 4 capsules (100 mg total) by mouth 3 (three) times daily as needed for itching. Patient taking differently: Take 25 mg by mouth 3 (three) times daily as needed for itching.  02/25/14  Yes Tiffany Daneil Dan, PA-C  insulin aspart (NOVOLOG) 100 UNIT/ML FlexPen Inject 9 Units into the skin 3 (three) times daily with meals. 08/11/15  Yes Tresa Garter, MD  Insulin Glargine (LANTUS SOLOSTAR) 100 UNIT/ML Solostar Pen Inject 24 Units into the skin at bedtime. 08/11/15  Yes Tresa Garter, MD  Insulin Syringe-Needle U-100 (BD INSULIN SYRINGE ULTRAFINE) 31G X 15/64" 0.5 ML MISC Use as directed 08/11/15  Yes Olugbemiga E Jegede, MD  INVOKANA 300 MG TABS tablet Take 1 tablet (300 mg total) by mouth daily. 07/06/15  Yes Maren Reamer, MD  Lancets (ACCU-CHEK MULTICLIX) lancets Use as instructed Patient taking differently: 1 each  by Other route See admin instructions. Check blood sugar 4 times daily 07/10/14  Yes Deepak Advani, MD  NEEDLE, DISP, 24 G (B-D DISP NEEDLE 24GX1") 24G X 1" MISC Use with solostar pen to inject 4 times daily 07/09/14  Yes Tresa Garter, MD  nystatin (MYCOSTATIN/NYSTOP) powder Apply topically 4 (four) times daily. 07/06/15  Yes Maren Reamer, MD  pantoprazole (PROTONIX) 40 MG tablet Take 1 tablet (40 mg total) by mouth daily. 08/29/15  Yes Recardo Evangelist, PA-C  ranitidine (ZANTAC) 150 MG tablet Take 1 tablet (150 mg total) by mouth 2 (two) times daily. Patient taking differently: Take 150 mg by mouth daily as needed for heartburn.  07/06/15  Yes Ravin Denardo Lazarus Gowda, MD  Travoprost, BAK Free, (TRAVATAN) 0.004 % SOLN ophthalmic solution  Place 1 drop into both eyes daily.    Yes Historical Provider, MD  zolpidem (AMBIEN) 5 MG tablet Take 5 mg by mouth at bedtime.   Yes Historical Provider, MD  metFORMIN (GLUCOPHAGE XR) 500 MG 24 hr tablet Take 2 tablets (1,000 mg total) by mouth 2 (two) times daily. 08/30/15   Maren Reamer, MD  metoCLOPramide (REGLAN) 10 MG tablet Take 1 tablet (10 mg total) by mouth every 6 (six) hours as needed for nausea. 08/30/15   Maren Reamer, MD  sucralfate (CARAFATE) 1 GM/10ML suspension Take 10 mLs (1 g total) by mouth 4 (four) times daily -  with meals and at bedtime. 08/30/15   Maren Reamer, MD     Objective:   Vitals:   08/30/15 1419  BP: 117/82  Pulse: 62  Resp: 16  Temp: 98.4 F (36.9 C)  TempSrc: Oral  SpO2: 97%  Weight: 298 lb 6.4 oz (135.4 kg)    Exam General appearance : Awake, alert, not in any distress. Speech Clear. Not toxic looking, morbid obese.  HEENT: Atraumatic and Normocephalic, pupils equally reactive to light.   Neck: supple, no JVD.  Chest:Good air entry bilaterally, no added sounds.  Pain not reproducible on exam.  CVS: S1 S2 regular, no murmurs/gallups or rubs. Abdomen: Bowel sounds active, obese, difficult exam due to body habitus. no gaurding, rigidity or rebound. Extremities: B/L Lower Ext shows no edema, both legs are warm to touch Neurology: Awake alert, and oriented X 3, CN II-XII grossly intact, Non focal Skin:No Rash  Data Review Lab Results  Component Value Date   HGBA1C 9.8 08/30/2015   HGBA1C 12.1 07/06/2015   HGBA1C 10.60 06/26/2014    Depression screen PHQ 2/9 08/30/2015 07/06/2015 07/03/2014 06/01/2014 03/20/2014  Decreased Interest 1 0 0 0 2  Down, Depressed, Hopeless 1 2 0 0 3  PHQ - 2 Score 2 2 0 0 5  Altered sleeping 0 0 - - 2  Tired, decreased energy 2 2 - - 3  Change in appetite 2 3 - - 2  Feeling bad or failure about yourself  3 1 - - 2  Trouble concentrating 2 2 - - 2  Moving slowly or fidgety/restless 1 0 - - 2  Suicidal  thoughts 1 0 - - 0  PHQ-9 Score 13 10 - - 18  Difficult doing work/chores - Very difficult - - -      Assessment & Plan   1. Type 2 diabetes mellitus without complication, without long-term current use of insulin (HCC) - suspected gastroparesis, encourage continue low carb diet and taking insulin. - POCT A1C 9.8 (from 12.1) - Glucose (CBG)  2. Gastroesophageal reflux disease without esophagitis  On trial protonix 40 qday, carafate qid added (hx of hypylori, PUD?) - Ambulatory referral to Gastroenterology  3.  Back pain - no trauma, may be multifactorial due to morbid obesity, also could be referred pain due to GERD as well, since started about same time.  4. Tobacco abuse Down to 7 cigs/day, recd complete cessation, tips provided.  5. Gastroparesis due to DM (HCC) Suspected, trial reglan as well  6. Morbid obesity, unspecified obesity type [facility])     Patient have been counseled extensively about nutrition and exercise  Return in about 2 months (around 10/30/2015), or if symptoms worsen or fail to improve.  The patient was given clear instructions to go to ER or return to medical center if symptoms don't improve, worsen or new problems develop. The patient verbalized understanding. The patient was told to call to get lab results if they haven't heard anything in the next week.   This note has been created with Surveyor, quantity. Any transcriptional errors are unintentional.   Maren Reamer, MD, [place] and [facility] [place], [place]   08/30/2015, 3:07 PM

## 2015-09-08 ENCOUNTER — Other Ambulatory Visit: Payer: Self-pay | Admitting: Internal Medicine

## 2015-09-29 ENCOUNTER — Ambulatory Visit (INDEPENDENT_AMBULATORY_CARE_PROVIDER_SITE_OTHER): Payer: Medicaid Other | Admitting: Podiatry

## 2015-09-29 DIAGNOSIS — L609 Nail disorder, unspecified: Secondary | ICD-10-CM | POA: Diagnosis not present

## 2015-09-29 DIAGNOSIS — L603 Nail dystrophy: Secondary | ICD-10-CM | POA: Diagnosis not present

## 2015-09-29 DIAGNOSIS — B351 Tinea unguium: Secondary | ICD-10-CM

## 2015-09-29 DIAGNOSIS — L608 Other nail disorders: Secondary | ICD-10-CM

## 2015-09-29 DIAGNOSIS — M79609 Pain in unspecified limb: Secondary | ICD-10-CM

## 2015-09-29 NOTE — Patient Instructions (Signed)
Diabetes and Foot Care Diabetes may cause you to have problems because of poor blood supply (circulation) to your feet and legs. This may cause the skin on your feet to become thinner, break easier, and heal more slowly. Your skin may become dry, and the skin may peel and crack. You may also have nerve damage in your legs and feet causing decreased feeling in them. You may not notice [place] injuries to your feet that could lead to infections or more serious problems. Taking care of your feet is one of the most important things you can do for yourself.  HOME CARE INSTRUCTIONS  Wear shoes at all times, even in the house. Do not go barefoot. Bare feet are easily injured.  Check your feet daily for blisters, cuts, and redness. If you cannot see the bottom of your feet, use a mirror or ask someone for help.  Wash your feet with warm water (do not use hot water) and mild soap. Then pat your feet and the areas between your toes until they are completely dry. Do not soak your feet as this can dry your skin.  Apply a moisturizing lotion or petroleum jelly (that does not contain alcohol and is unscented) to the skin on your feet and to dry, brittle toenails. Do not apply lotion between your toes.  Trim your toenails straight across. Do not dig under them or around the cuticle. File the edges of your nails with an emery board or nail file.  Do not cut corns or calluses or try to remove them with medicine.  Wear clean socks or stockings every day. Make sure they are not too tight. Do not wear knee-high stockings since they may decrease blood flow to your legs.  Wear shoes that fit properly and have enough cushioning. To break in new shoes, wear them for just a few hours a day. This prevents you from injuring your feet. Always look in your shoes before you put them on to be sure there are no objects inside.  Do not cross your legs. This may decrease the blood flow to your feet.  If you find a [place] scrape,  cut, or break in the skin on your feet, keep it and the skin around it clean and dry. These areas may be cleansed with mild soap and water. Do not cleanse the area with peroxide, alcohol, or iodine.  When you remove an adhesive bandage, be sure not to damage the skin around it.  If you have a wound, look at it several times a day to make sure it is healing.  Do not use heating pads or hot water bottles. They may burn your skin. If you have lost feeling in your feet or legs, you may not know it is happening until it is too late.  Make sure your health care provider performs a complete foot exam at least annually or more often if you have foot problems. Report any cuts, sores, or bruises to your health care provider immediately. SEEK MEDICAL CARE IF:   You have an injury that is not healing.  You have cuts or breaks in the skin.  You have an ingrown nail.  You notice redness on your legs or feet.  You feel burning or tingling in your legs or feet.  You have pain or cramps in your legs and feet.  Your legs or feet are numb.  Your feet always feel cold. SEEK IMMEDIATE MEDICAL CARE IF:   There is increasing redness,   swelling, or pain in or around a wound.  There is a red line that goes up your leg.  Pus is coming from a wound.  You develop a fever or as directed by your health care provider.  You notice a bad smell coming from an ulcer or wound.   This information is not intended to replace advice given to you by your health care provider. Make sure you discuss any questions you have with your health care provider.   Document Released: 12/24/1999 Document Revised: 08/28/2012 Document Reviewed: 06/04/2012 Elsevier Interactive Patient Education 2016 Elsevier Inc.  

## 2015-09-29 NOTE — Progress Notes (Signed)
   Subjective:    Patient ID: Tara Keller, female    DOB: Oct 05, 1965, 50 y.o.   MRN: [id number]  HPI    Review of Systems  Musculoskeletal: Positive for back pain.  Psychiatric/Behavioral: The patient is nervous/anxious.        Objective:   Physical Exam        Assessment & Plan:

## 2015-10-03 NOTE — Progress Notes (Signed)
Patient ID: Tara Keller, female   DOB: [birth year], 50 y.o.   MRN: [id number] SUBJECTIVE Patient with a history of diabetes mellitus since diagnosis 5 years prior presents to office today complaining of elongated, thickened nails. Pain while ambulating in shoes. Patient is unable to trim their own nails. Patient is also concerned about nail fungus to her bilateral nails. She states that the thickening and discoloration is been there several years now.  No Known Allergies  OBJECTIVE General Patient is awake, alert, and oriented x 3 and in no acute distress. Derm Skin is dry and supple bilateral. Negative open lesions or macerations. Remaining integument unremarkable. Nails are tender, long, thickened and dystrophic with subungual debris, consistent with onychomycosis, 1-5 bilateral. No signs of infection noted. Vasc  DP and PT pedal pulses palpable bilaterally. Temperature gradient within normal limits.  Neuro Epicritic and protective threshold sensation diminished bilaterally.  Musculoskeletal Exam No symptomatic pedal deformities noted bilateral. Muscular strength within normal limits.  ASSESSMENT 1. Diabetes Mellitus w/ peripheral neuropathy 2. Onychomycosis of nail due to dermatophyte bilateral 3. Pain in foot bilateral 4. Onychodystrophy of nail  PLAN OF CARE Patient evaluated today. Instructed to maintain good pedal hygiene and foot care. Stressed importance of controlling blood sugar.  Mechanical debridement of nails 1-5 bilaterally performed using a nail nipper. Filed with dremel without incident.  No biopsy was performed and sent for fungal culture All patient questions were answered. Return to clinic in 4 weeks to review fungal culture results.   Edrick Kins, DPM

## 2015-10-04 NOTE — Addendum Note (Signed)
Addended by: Johnnye Lana A on: 10/04/2015 05:18 PM   Modules accepted: Orders

## 2015-10-08 ENCOUNTER — Telehealth: Payer: Self-pay | Admitting: Internal Medicine

## 2015-10-08 MED ORDER — INSULIN PEN NEEDLE 31G X 8 MM MISC: 12 refills | Status: DC

## 2015-10-08 NOTE — Telephone Encounter (Signed)
Pen needles refilled 

## 2015-10-08 NOTE — Telephone Encounter (Signed)
Pt. Called stating that she needs a refill no needles for her insulin. Pt. Uses [facility] pharmacy. Please f/u

## 2015-10-27 ENCOUNTER — Ambulatory Visit: Payer: Medicaid Other | Admitting: Podiatry

## 2015-11-10 ENCOUNTER — Ambulatory Visit: Payer: Medicaid Other | Admitting: Internal Medicine

## 2015-11-11 ENCOUNTER — Encounter: Payer: Self-pay | Admitting: Podiatry

## 2015-11-11 ENCOUNTER — Ambulatory Visit (INDEPENDENT_AMBULATORY_CARE_PROVIDER_SITE_OTHER): Payer: Medicaid Other | Admitting: Podiatry

## 2015-11-11 DIAGNOSIS — L608 Other nail disorders: Secondary | ICD-10-CM

## 2015-11-11 DIAGNOSIS — M79675 Pain in left toe(s): Secondary | ICD-10-CM

## 2015-11-11 DIAGNOSIS — M79674 Pain in right toe(s): Secondary | ICD-10-CM

## 2015-11-11 DIAGNOSIS — L603 Nail dystrophy: Secondary | ICD-10-CM

## 2015-11-13 NOTE — Progress Notes (Signed)
Subjective:  Patient presents today for follow-up evaluation of toenail biopsy. Patient complains of elongated, thickened nails. Patient states that they are painful when walking in shoes. Patient presents today further treatment and evaluation.    Objective/Physical Exam General: The patient is alert and oriented x3 in no acute distress.  Dermatology: Nails are tender, long, thickened and dystrophic with subungual debris, consistent with onychomycosis, 1-5 bilateral. No signs of infection noted. Skin is warm, dry and supple bilateral lower extremities. Negative for open lesions or macerations.  Vascular: Palpable pedal pulses bilaterally. No edema or erythema noted. Capillary refill within normal limits.  Neurological: Epicritic and protective threshold grossly intact bilaterally.   Musculoskeletal Exam: Range of motion within normal limits to all pedal and ankle joints bilateral. Muscle strength 5/5 in all groups bilateral.   Assessment: #1 dystrophic nails 1-5 bilateral, negative for fungal elements likely due to microtrauma #2 hyperkeratosis of nails 1-5 bilateral #3 pain in bilateral feet     Plan of Care:  #1 Patient was evaluated.Fungal culture nail biopsy was reviewed which was negative for fungus.  #2 Thickened nails due to microtrauma based on pathology report. Discussed management of hyperkeratotic nails. #3 patient is to return to clinic when necessary   Dr. Edrick Kins, [place]

## 2015-12-13 ENCOUNTER — Other Ambulatory Visit: Payer: Self-pay | Admitting: Pharmacist

## 2015-12-13 ENCOUNTER — Telehealth: Payer: Self-pay | Admitting: Internal Medicine

## 2015-12-13 MED ORDER — ACCU-CHEK MULTICLIX LANCETS MISC: 12 refills | Status: DC

## 2015-12-13 MED ORDER — ACCU-CHEK FASTCLIX LANCETS MISC: 12 refills | Status: DC

## 2015-12-13 NOTE — Telephone Encounter (Signed)
Lancets refilled 

## 2015-12-13 NOTE — Telephone Encounter (Signed)
Patient is requesting prescription refill for Lancets (ACCU-CHEK MULTICLIX) lancets [id number]    Please follow up with patient when refilled

## 2015-12-22 ENCOUNTER — Telehealth: Payer: Self-pay | Admitting: Internal Medicine

## 2015-12-22 ENCOUNTER — Other Ambulatory Visit: Payer: Self-pay

## 2015-12-22 MED ORDER — ACCU-CHEK FASTCLIX LANCETS MISC: 12 refills | Status: DC

## 2015-12-22 NOTE — Telephone Encounter (Signed)
Resent lancets 

## 2015-12-22 NOTE — Telephone Encounter (Signed)
Pt. Called requesting a refill on needles. Pt. Uses the [facility] and states that went she went to the pharmacy they gave her the wrong one. Pt. Uses [facility] on [address]. Please f/u

## 2016-01-13 ENCOUNTER — Emergency Department (HOSPITAL_COMMUNITY)
Admission: EM | Admit: 2016-01-13 | Discharge: 2016-01-13 | Disposition: A | Discharge status: Home / Self Care | Payer: Medicaid Other | Attending: Emergency Medicine | Admitting: Emergency Medicine

## 2016-01-13 ENCOUNTER — Encounter (HOSPITAL_COMMUNITY): Payer: Self-pay | Admitting: Emergency Medicine

## 2016-01-13 DIAGNOSIS — H6691 Otitis media, unspecified, right ear: Secondary | ICD-10-CM

## 2016-01-13 DIAGNOSIS — Z794 Long term (current) use of insulin: Secondary | ICD-10-CM | POA: Insufficient documentation

## 2016-01-13 DIAGNOSIS — I10 Essential (primary) hypertension: Secondary | ICD-10-CM | POA: Diagnosis not present

## 2016-01-13 DIAGNOSIS — J45909 Unspecified asthma, uncomplicated: Secondary | ICD-10-CM | POA: Insufficient documentation

## 2016-01-13 DIAGNOSIS — E119 Type 2 diabetes mellitus without complications: Secondary | ICD-10-CM | POA: Diagnosis not present

## 2016-01-13 DIAGNOSIS — Z79899 Other long term (current) drug therapy: Secondary | ICD-10-CM | POA: Insufficient documentation

## 2016-01-13 DIAGNOSIS — F17210 Nicotine dependence, cigarettes, uncomplicated: Secondary | ICD-10-CM | POA: Diagnosis not present

## 2016-01-13 DIAGNOSIS — H9201 Otalgia, right ear: Secondary | ICD-10-CM | POA: Diagnosis present

## 2016-01-13 MED ORDER — AMOXICILLIN-POT CLAVULANATE 875-125 MG PO TABS
1.0000 | ORAL_TABLET | Freq: Once | ORAL | Status: AC
  Administered 2016-01-13: 1 via ORAL
  Filled 2016-01-13: qty 1

## 2016-01-13 MED ORDER — OXYCODONE-ACETAMINOPHEN 5-325 MG PO TABS
1.0000 | ORAL_TABLET | Freq: Once | ORAL | Status: AC
  Administered 2016-01-13: 1 via ORAL

## 2016-01-13 MED ORDER — NAPROXEN 500 MG PO TABS: 500.0000 mg | ORAL_TABLET | Freq: Two times a day (BID) | ORAL | 0 refills | Status: DC

## 2016-01-13 MED ORDER — AMOXICILLIN-POT CLAVULANATE 875-125 MG PO TABS: 1.0000 | ORAL_TABLET | Freq: Two times a day (BID) | ORAL | 0 refills | Status: DC

## 2016-01-13 MED ORDER — OXYCODONE-ACETAMINOPHEN 5-325 MG PO TABS
1.0000 | ORAL_TABLET | Freq: Once | ORAL | Status: AC
  Administered 2016-01-13: 1 via ORAL
  Filled 2016-01-13: qty 1

## 2016-01-13 MED ORDER — OXYCODONE-ACETAMINOPHEN 5-325 MG PO TABS
ORAL_TABLET | ORAL | Status: AC
  Filled 2016-01-13: qty 1

## 2016-01-13 NOTE — Discharge Instructions (Signed)
Follow up with your doctor in the next few days to recheck the ear. Return here for worsening symptoms

## 2016-01-13 NOTE — ED Triage Notes (Signed)
Patient reports right ear pain onset this week with no drainage , no injury or hearing loss.

## 2016-01-13 NOTE — ED Provider Notes (Signed)
[facility] DEPT Provider Note   CSN: [id number] Arrival date & time: 01/13/16  2130  By signing my name below, I, Reola Mosher, attest that this documentation has been prepared under the direction and in the presence of [facility], [place].  Electronically Signed: Reola Mosher, ED Scribe. 01/13/16. 11:25 PM.  History   Chief Complaint Chief Complaint  Patient presents with  . Otalgia   The history is provided by the patient. No language interpreter was used.  Otalgia  This is a recurrent problem. The current episode started more than 2 days ago. There is pain in the right ear. The problem occurs constantly. The problem has been gradually worsening. There has been no fever. The pain is moderate. Associated symptoms include sore throat. Pertinent negatives include no ear discharge and no cough.    HPI Comments: Tara Keller is a 51 y.o. female with a h/o DM, who presents to the Emergency Department complaining of gradually worsening, constant right ear pain onset ~4 days ago. She describes her pain as throbbing. She reports associated sore throat secondary to her ear pain. Pt has a h/o of otitis media (most recently dx'd ~1 year ago), and states that the presentation and quality of her symptoms today are similar to this. She has been taking Tylenol at home without relief of her pain. She denies headache, cough, congestion, ear drainage, eye drainage, visual disturbance, dental pain, fever, or any other associated symptoms.   Past Medical History:  Diagnosis Date  . Anxiety   . Arthritis   . Asthma   . Depression   . Diabetes mellitus   . Fatty liver   . Fracture closed of upper end of forearm 9 years, Fx left left leg  . Fracture of left lower leg @ 51 years old  . H/O tubal ligation   . Hiatal hernia   . Hypertension   . Obesity   . Positive H. pylori test   . Sleep apnea    uses CPAP  . Tubular adenoma of colon    Patient Active Problem List   Diagnosis Date  Noted  . OSA on CPAP 07/06/2015  . Depression (emotion) 07/06/2015  . Abnormal barium swallow   . Abdominal pain, epigastric 08/15/2013  . Benign neoplasm of colon 08/15/2013  . Special screening for malignant neoplasms, colon 08/15/2013  . Hepatic steatosis 05/19/2013  . History of pancreatitis 05/19/2013  . Family history of colon cancer 05/19/2013  . GERD (gastroesophageal reflux disease) 02/18/2013  . Smoking 02/18/2013  . Morbid obesity ([place]) 05/28/2012  . Type 2 diabetes mellitus ([place]) 05/28/2012  . Osteoarthritis of left and right knee 05/28/2012  . Generalized anxiety disorder 05/28/2012   Past Surgical History:  Procedure Laterality Date  . CHOLECYSTECTOMY    . COLONOSCOPY WITH PROPOFOL N/A 08/15/2013   Procedure: COLONOSCOPY WITH PROPOFOL;  Surgeon: Jerene Bears, MD;  Location: [facility] ENDOSCOPY;  Service: Gastroenterology;  Laterality: N/A;  . ESOPHAGOGASTRODUODENOSCOPY N/A 05/12/2014   Procedure: ESOPHAGOGASTRODUODENOSCOPY (EGD);  Surgeon: Jerene Bears, MD;  Location: [facility];  Service: Gastroenterology;  Laterality: N/A;  . ESOPHAGOGASTRODUODENOSCOPY (EGD) WITH PROPOFOL N/A 08/15/2013   Procedure: ESOPHAGOGASTRODUODENOSCOPY (EGD) WITH PROPOFOL;  Surgeon: Jerene Bears, MD;  Location: [facility] ENDOSCOPY;  Service: Gastroenterology;  Laterality: N/A;  . TUBAL LIGATION Bilateral     OB History    Gravida Para Term Preterm AB Living   3 3 2 1   4    SAB TAB Ectopic Multiple Live Births  1       Home Medications    Prior to Admission medications   Medication Sig Start Date End Date Taking? Authorizing Provider  ACCU-CHEK AVIVA PLUS test strip TEST three times a day 09/08/15   Maren Reamer, MD  ACCU-CHEK FASTCLIX LANCETS MISC Use as directed 12/22/15   Maren Reamer, MD  amoxicillin-clavulanate (AUGMENTIN) 875-125 MG tablet Take 1 tablet by mouth 2 (two) times daily. 01/13/16   Danyell Shader Bunnie Pion, NP  buPROPion (WELLBUTRIN XL) 150 MG 24 hr tablet Take 450 mg by mouth daily.     Historical Provider, MD  clonazePAM (KLONOPIN) 1 MG tablet Take 1 mg by mouth at bedtime.    Historical Provider, MD  DULoxetine (CYMBALTA) 60 MG capsule Take 60 mg by mouth 2 (two) times daily.     Historical Provider, MD  hydrOXYzine (VISTARIL) 25 MG capsule Take 4 capsules (100 mg total) by mouth 3 (three) times daily as needed for itching. Patient taking differently: Take 25 mg by mouth 3 (three) times daily as needed for itching.  02/25/14   Brayton Caves, PA-C  insulin aspart (NOVOLOG) 100 UNIT/ML FlexPen Inject 9 Units into the skin 3 (three) times daily with meals. 08/11/15   Tresa Garter, MD  Insulin Glargine (LANTUS SOLOSTAR) 100 UNIT/ML Solostar Pen Inject 24 Units into the skin at bedtime. 08/11/15   Tresa Garter, MD  Insulin Pen Needle 31G X 8 MM MISC Use as directed for 4 times daily insulin administration. 10/08/15   Maren Reamer, MD  Insulin Syringe-Needle U-100 (BD INSULIN SYRINGE ULTRAFINE) 31G X 15/64" 0.5 ML MISC Use as directed 08/11/15   Tresa Garter, MD  INVOKANA 300 MG TABS tablet Take 1 tablet (300 mg total) by mouth daily. 07/06/15   Maren Reamer, MD  metFORMIN (GLUCOPHAGE XR) 500 MG 24 hr tablet Take 2 tablets (1,000 mg total) by mouth 2 (two) times daily. 08/30/15   Maren Reamer, MD  metoCLOPramide (REGLAN) 10 MG tablet Take 1 tablet (10 mg total) by mouth every 6 (six) hours as needed for nausea. 08/30/15   Maren Reamer, MD  naproxen (NAPROSYN) 500 MG tablet Take 1 tablet (500 mg total) by mouth 2 (two) times daily. 01/13/16   Karon Cotterill Bunnie Pion, NP  NEEDLE, DISP, 24 G (B-D DISP NEEDLE 24GX1") 24G X 1" MISC Use with solostar pen to inject 4 times daily 07/09/14   Tresa Garter, MD  nystatin (MYCOSTATIN/NYSTOP) powder Apply topically 4 (four) times daily. 07/06/15   Maren Reamer, MD  pantoprazole (PROTONIX) 40 MG tablet Take 1 tablet (40 mg total) by mouth daily. 08/29/15   Recardo Evangelist, PA-C  ranitidine (ZANTAC) 150 MG tablet Take 1 tablet  (150 mg total) by mouth 2 (two) times daily. Patient taking differently: Take 150 mg by mouth daily as needed for heartburn.  07/06/15   Maren Reamer, MD  sucralfate (CARAFATE) 1 GM/10ML suspension Take 10 mLs (1 g total) by mouth 4 (four) times daily -  with meals and at bedtime. 08/30/15   Maren Reamer, MD  Travoprost, BAK Free, (TRAVATAN) 0.004 % SOLN ophthalmic solution Place 1 drop into both eyes daily.     Historical Provider, MD  zolpidem (AMBIEN) 5 MG tablet Take 5 mg by mouth at bedtime.    Historical Provider, MD   Family History Family History  Problem Relation Age of Onset  . Diabetes Mother   . Stroke Mother   .  Hypertension Mother   . Cancer Sister    Social History Social History  Substance Use Topics  . Smoking status: Current Every Day Smoker    Packs/day: 0.25    Types: Cigarettes  . Smokeless tobacco: Never Used  . Alcohol use No   Allergies   Patient has no known allergies.  Review of Systems Review of Systems  Constitutional: Negative for fever.  HENT: Positive for ear pain and sore throat. Negative for dental problem and ear discharge.   Eyes: Negative for discharge and visual disturbance.  Respiratory: Negative for cough.   All other systems reviewed and are negative.  Physical Exam Updated Vital Signs BP 131/90 (BP Location: Left Arm)   Pulse 91   Temp 98.5 F (36.9 C) (Oral)   Resp 18   Ht 5\' 6"  (1.676 m)   Wt (!) 136.2 kg   SpO2 100%   BMI 48.48 kg/m   Physical Exam  Constitutional: She appears well-developed and well-nourished. No distress.  HENT:  Head: Normocephalic and atraumatic.  Right Ear: Tympanic membrane is bulging.  Left Ear: Tympanic membrane normal.  Mouth/Throat: Uvula is midline, oropharynx is clear and moist and mucous membranes are normal. No oropharyngeal exudate, posterior oropharyngeal edema, posterior oropharyngeal erythema or tonsillar abscesses. No tonsillar exudate.  No mastoid tenderness. Gingival swelling  with tenderness to the area over the right, upper molar. No tenderness over the tooth itself.   Eyes: Conjunctivae are normal.  Neck: Normal range of motion.  Cardiovascular: Normal rate.   Pulmonary/Chest: Effort normal.  Abdominal: She exhibits no distension.  Musculoskeletal: Normal range of motion.  Lymphadenopathy:    She has cervical adenopathy.  Neurological: She is alert.  Skin: No pallor.  Psychiatric: She has a normal mood and affect. Her behavior is normal.  Nursing note and vitals reviewed.  ED Treatments / Results  DIAGNOSTIC STUDIES: Oxygen Saturation is 100% on RA, normal by my interpretation.   COORDINATION OF CARE: 11:20 PM-Discussed next steps with pt. Pt verbalized understanding and is agreeable with the plan.   Labs (all labs ordered are listed, but only abnormal results are displayed) Labs Reviewed - No data to display Radiology No results found.  Procedures Procedures   Medications Ordered in ED Medications  oxyCODONE-acetaminophen (PERCOCET/ROXICET) 5-325 MG per tablet 1 tablet (1 tablet Oral Given 01/13/16 2140)  amoxicillin-clavulanate (AUGMENTIN) 875-125 MG per tablet 1 tablet (1 tablet Oral Given 01/13/16 2344)  oxyCODONE-acetaminophen (PERCOCET/ROXICET) 5-325 MG per tablet 1 tablet (1 tablet Oral Given 01/13/16 2344)   Initial Impression / Assessment and Plan / ED Course  I have reviewed the triage vital signs and the nursing notes.   Clinical Course    Patient presents with otalgia and exam consistent with acute otitis media. No concern for acute mastoiditis, meningitis. Patient discharged home with Augmentin. Advised parents to call PCP today for follow-up. I have also discussed reasons to return immediately to the ER.  Parent expresses understanding and agrees with plan.   Final Clinical Impressions(s) / ED Diagnoses   Final diagnoses:  Acute otitis media, right   New Prescriptions Discharge Medication List as of 01/13/2016 11:30 PM      START taking these medications   Details  amoxicillin-clavulanate (AUGMENTIN) 875-125 MG tablet Take 1 tablet by mouth 2 (two) times daily., Starting Thu 01/13/2016, Print    naproxen (NAPROSYN) 500 MG tablet Take 1 tablet (500 mg total) by mouth 2 (two) times daily., Starting Thu 01/13/2016, Print  I personally performed the services described in this documentation, which was scribed in my presence. The recorded information has been reviewed and is accurate.     [address] [place], [place] 01/14/16 [place], MD 01/28/16 1539

## 2016-01-17 ENCOUNTER — Encounter: Payer: Self-pay | Admitting: Internal Medicine

## 2016-01-17 ENCOUNTER — Other Ambulatory Visit (HOSPITAL_COMMUNITY)
Admission: RE | Admit: 2016-01-17 | Discharge: 2016-01-17 | Disposition: A | Discharge status: Home / Self Care | Payer: Medicaid Other | Source: Ambulatory Visit | Attending: Internal Medicine | Admitting: Internal Medicine

## 2016-01-17 ENCOUNTER — Ambulatory Visit: Payer: Medicaid Other | Admitting: Internal Medicine

## 2016-01-17 ENCOUNTER — Ambulatory Visit: Payer: Medicaid Other | Attending: Internal Medicine | Admitting: Internal Medicine

## 2016-01-17 VITALS — BP 131/83 | HR 80 | Temp 97.9°F | Resp 16 | Wt 296.2 lb

## 2016-01-17 DIAGNOSIS — IMO0002 Reserved for concepts with insufficient information to code with codable children: Secondary | ICD-10-CM

## 2016-01-17 DIAGNOSIS — E1140 Type 2 diabetes mellitus with diabetic neuropathy, unspecified: Secondary | ICD-10-CM

## 2016-01-17 DIAGNOSIS — Z1329 Encounter for screening for other suspected endocrine disorder: Secondary | ICD-10-CM

## 2016-01-17 DIAGNOSIS — Z79899 Other long term (current) drug therapy: Secondary | ICD-10-CM | POA: Insufficient documentation

## 2016-01-17 DIAGNOSIS — B379 Candidiasis, unspecified: Secondary | ICD-10-CM | POA: Insufficient documentation

## 2016-01-17 DIAGNOSIS — G4733 Obstructive sleep apnea (adult) (pediatric): Secondary | ICD-10-CM | POA: Insufficient documentation

## 2016-01-17 DIAGNOSIS — F17200 Nicotine dependence, unspecified, uncomplicated: Secondary | ICD-10-CM

## 2016-01-17 DIAGNOSIS — Z6841 Body Mass Index (BMI) 40.0 and over, adult: Secondary | ICD-10-CM | POA: Insufficient documentation

## 2016-01-17 DIAGNOSIS — R531 Weakness: Secondary | ICD-10-CM | POA: Diagnosis not present

## 2016-01-17 DIAGNOSIS — F329 Major depressive disorder, single episode, unspecified: Secondary | ICD-10-CM | POA: Diagnosis not present

## 2016-01-17 DIAGNOSIS — Z794 Long term (current) use of insulin: Secondary | ICD-10-CM | POA: Insufficient documentation

## 2016-01-17 DIAGNOSIS — E079 Disorder of thyroid, unspecified: Secondary | ICD-10-CM | POA: Insufficient documentation

## 2016-01-17 DIAGNOSIS — Z720 Tobacco use: Secondary | ICD-10-CM | POA: Diagnosis not present

## 2016-01-17 DIAGNOSIS — F40240 Claustrophobia: Secondary | ICD-10-CM | POA: Diagnosis not present

## 2016-01-17 DIAGNOSIS — E1165 Type 2 diabetes mellitus with hyperglycemia: Secondary | ICD-10-CM | POA: Insufficient documentation

## 2016-01-17 DIAGNOSIS — R29898 Other symptoms and signs involving the musculoskeletal system: Secondary | ICD-10-CM

## 2016-01-17 LAB — BASIC METABOLIC PANEL WITH GFR
BUN: 7 mg/dL (ref 7–25)
CALCIUM: 10 mg/dL (ref 8.6–10.4)
CHLORIDE: 99 mmol/L (ref 98–110)
CO2: 31 mmol/L (ref 20–31)
CREATININE: 0.61 mg/dL (ref 0.50–1.05)
GFR, Est African American: >89 mL/min (ref >=60)
Glucose, Bld: 214 mg/dL — ABNORMAL HIGH (ref 65–99)
Potassium: 3.8 mmol/L (ref 3.5–5.3)
SODIUM: 140 mmol/L (ref 135–146)

## 2016-01-17 LAB — POCT URINALYSIS DIPSTICK
Bilirubin, UA: NEGATIVE
Glucose, UA: NEGATIVE
KETONES UA: NEGATIVE
Leukocytes, UA: NEGATIVE
Nitrite, UA: NEGATIVE
PH UA: 7
SPEC GRAV UA: 1.015
UROBILINOGEN UA: 0.2

## 2016-01-17 LAB — TSH: TSH: 0.69 mIU/L

## 2016-01-17 LAB — POCT GLYCOSYLATED HEMOGLOBIN (HGB A1C): Hemoglobin A1C: 9.4

## 2016-01-17 LAB — GLUCOSE, POCT (MANUAL RESULT ENTRY): POC GLUCOSE: 227 mg/dL — AB (ref 70–99)

## 2016-01-17 MED ORDER — INSULIN GLARGINE 100 UNIT/ML SOLOSTAR PEN: 28.0000 [IU] | PEN_INJECTOR | Freq: Every day | SUBCUTANEOUS | 2 refills | Status: DC

## 2016-01-17 MED ORDER — NYSTATIN 100000 UNIT/GM EX POWD: Freq: Four times a day (QID) | CUTANEOUS | 0 refills | Status: DC

## 2016-01-17 MED ORDER — INVOKANA 300 MG PO TABS: 300.0000 mg | ORAL_TABLET | Freq: Every day | ORAL | 3 refills | Status: DC

## 2016-01-17 MED ORDER — FLUCONAZOLE 150 MG PO TABS: 150.0000 mg | ORAL_TABLET | Freq: Every day | ORAL | 0 refills | Status: DC

## 2016-01-17 MED ORDER — METFORMIN HCL ER 500 MG PO TB24: 1000.0000 mg | ORAL_TABLET | Freq: Two times a day (BID) | ORAL | 2 refills | Status: DC

## 2016-01-17 NOTE — Patient Instructions (Signed)
- fu [facility] 2 wks /dm chk.   Check blood sugars on waking up daily., and at least 1-2 times rest of day.    Also check blood sugars about 2 hours after a meal and do this after different meals by rotation  Recommended blood sugar levels on waking up is 90-130 and about 2 hours after meal is 130-160  Please bring your blood sugar monitor to each visit, thank you  Restart exercise  Continue cholesterol med.   - Lantus Titration  Instructions:  IF AM blood sugar is  >150, increase lantus by 1 unit at night.  Continue to check sugars daily. On day 3, if blood sugar >150, continue to increase lantus by 1 unit.  If blood sugar < 70s, than need to reduce dose by 1 unit nightly.  Continue to check blood sugars in am daily and adjust per above.  -- please call us if you have any questions   -  Diabetes Mellitus and Food It is important for you to manage your blood sugar (glucose) level. Your blood glucose level can be greatly affected by what you eat. Eating healthier foods in the appropriate amounts throughout the day at about the same time each day will help you control your blood glucose level. It can also help slow or prevent worsening of your diabetes mellitus. Healthy eating may even help you improve the level of your blood pressure and reach or maintain a healthy weight. General recommendations for healthful eating and cooking habits include:  Eating meals and snacks regularly. Avoid going long periods of time without eating to lose weight.  Eating a diet that consists mainly of plant-based foods, such as fruits, vegetables, nuts, legumes, and whole grains.  Using low-heat cooking methods, such as baking, instead of high-heat cooking methods, such as deep frying. Work with your dietitian to make sure you understand how to use the Nutrition Facts information on food labels. How can food affect me? Carbohydrates  Carbohydrates affect your blood glucose level more  than any other type of food. Your dietitian will help you determine how many carbohydrates to eat at each meal and teach you how to count carbohydrates. Counting carbohydrates is important to keep your blood glucose at a healthy level, especially if you are using insulin or taking certain medicines for diabetes mellitus. Alcohol  Alcohol can cause sudden decreases in blood glucose (hypoglycemia), especially if you use insulin or take certain medicines for diabetes mellitus. Hypoglycemia can be a life-threatening condition. Symptoms of hypoglycemia (sleepiness, dizziness, and disorientation) are similar to symptoms of having too much alcohol. If your health care provider has given you approval to drink alcohol, do so in moderation and use the following guidelines:  Women should not have more than one drink per day, and men should not have more than two drinks per day. One drink is equal to:  12 oz of beer.  5 oz of wine.  1 oz of hard liquor.  Do not drink on an empty stomach.  Keep yourself hydrated. Have water, diet soda, or unsweetened iced tea.  Regular soda, juice, and other mixers might contain a lot of carbohydrates and should be counted. What foods are not recommended? As you make food choices, it is important to remember that all foods are not the same. Some foods have fewer nutrients per serving than other foods, even though they might have the same number of calories or carbohydrates. It is difficult to get your body what  it needs when you eat foods with fewer nutrients. Examples of foods that you should avoid that are high in calories and carbohydrates but low in nutrients include:  Trans fats (most processed foods list trans fats on the Nutrition Facts label).  Regular soda.  Juice.  Candy.  Sweets, such as cake, pie, doughnuts, and cookies.  Fried foods. What foods can I eat? Eat nutrient-rich foods, which will nourish your body and keep you healthy. The food you should  eat also will depend on several factors, including:  The calories you need.  The medicines you take.  Your weight.  Your blood glucose level.  Your blood pressure level.  Your cholesterol level. You should eat a variety of foods, including:  Protein.  Lean cuts of meat.  Proteins low in saturated fats, such as fish, egg whites, and beans. Avoid processed meats.  Fruits and vegetables.  Fruits and vegetables that may help control blood glucose levels, such as apples, mangoes, and yams.  Dairy products.  Choose fat-free or low-fat dairy products, such as milk, yogurt, and cheese.  Grains, bread, pasta, and rice.  Choose whole grain products, such as multigrain bread, whole oats, and brown rice. These foods may help control blood pressure.  Fats.  Foods containing healthful fats, such as nuts, avocado, olive oil, canola oil, and fish. Does everyone with diabetes mellitus have the same meal plan? Because every person with diabetes mellitus is different, there is not one meal plan that works for everyone. It is very important that you meet with a dietitian who will help you create a meal plan that is just right for you. This information is not intended to replace advice given to you by your health care provider. Make sure you discuss any questions you have with your health care provider. Document Released: 09/22/2004 Document Revised: 06/03/2015 Document Reviewed: 11/22/2012 Elsevier Interactive Patient Education  2017 Elsevier Inc.  -  Diabetes Mellitus and Exercise Exercising regularly is important for your overall health, especially when you have diabetes (diabetes mellitus). Exercising is not only about losing weight. It has many health benefits, such as increasing muscle strength and bone density and reducing body fat and stress. This leads to improved fitness, flexibility, and endurance, all of which result in better overall health. Exercise has additional benefits for  people with diabetes, including:  Reducing appetite.  Helping to lower and control blood glucose.  Lowering blood pressure.  Helping to control amounts of fatty substances (lipids) in the blood, such as cholesterol and triglycerides.  Helping the body to respond better to insulin (improving insulin sensitivity).  Reducing how much insulin the body needs.  Decreasing the risk for heart disease by:  Lowering cholesterol and triglyceride levels.  Increasing the levels of good cholesterol.  Lowering blood glucose levels. What is my activity plan? Your health care provider or certified diabetes educator can help you make a plan for the type and frequency of exercise (activity plan) that works for you. Make sure that you:  Do at least 150 minutes of moderate-intensity or vigorous-intensity exercise each week. This could be brisk walking, biking, or water aerobics.  Do stretching and strength exercises, such as yoga or weightlifting, at least 2 times a week.  Spread out your activity over at least 3 days of the week.  Get some form of physical activity every day.  Do not go more than 2 days in a row without some kind of physical activity.  Avoid being inactive for more  than 90 minutes at a time. Take frequent breaks to walk or stretch.  Choose a type of exercise or activity that you enjoy, and set realistic goals.  Start slowly, and gradually increase the intensity of your exercise over time. What do I need to know about managing my diabetes?  Check your blood glucose before and after exercising.  If your blood glucose is higher than 240 mg/dL (13.3 mmol/L) before you exercise, check your urine for ketones. If you have ketones in your urine, do not exercise until your blood glucose returns to normal.  Know the symptoms of low blood glucose (hypoglycemia) and how to treat it. Your risk for hypoglycemia increases during and after exercise. Common symptoms of hypoglycemia can  include:  Hunger.  Anxiety.  Sweating and feeling clammy.  Confusion.  Dizziness or feeling light-headed.  Increased heart rate or palpitations.  Blurry vision.  Tingling or numbness around the mouth, lips, or tongue.  Tremors or shakes.  Irritability.  Keep a rapid-acting carbohydrate snack available before, during, and after exercise to help prevent or treat hypoglycemia.  Avoid injecting insulin into areas of the body that are going to be exercised. For example, avoid injecting insulin into:  The arms, when playing tennis.  The legs, when jogging.  Keep records of your exercise habits. Doing this can help you and your health care provider adjust your diabetes management plan as needed. Write down:  Food that you eat before and after you exercise.  Blood glucose levels before and after you exercise.  The type and amount of exercise you have done.  When your insulin is expected to peak, if you use insulin. Avoid exercising at times when your insulin is peaking.  When you start a new exercise or activity, work with your health care provider to make sure the activity is safe for you, and to adjust your insulin, medicines, or food intake as needed.  Drink plenty of water while you exercise to prevent dehydration or heat stroke. Drink enough fluid to keep your urine clear or pale yellow. This information is not intended to replace advice given to you by your health care provider. Make sure you discuss any questions you have with your health care provider. Document Released: 03/18/2003 Document Revised: 07/16/2015 Document Reviewed: 06/07/2015 Elsevier Interactive Patient Education  2017 [employer].   -   Steps to Quit Smoking Smoking tobacco can be bad for your health. It can also affect almost every organ in your body. Smoking puts you and people around you at risk for many serious long-lasting (chronic) diseases. Quitting smoking is hard, but it is one of the  best things that you can do for your health. It is never too late to quit. What are the benefits of quitting smoking? When you quit smoking, you lower your risk for getting serious diseases and conditions. They can include:  Lung cancer or lung disease.  Heart disease.  Stroke.  Heart attack.  Not being able to have children (infertility).  Weak bones (osteoporosis) and broken bones (fractures). If you have coughing, wheezing, and shortness of breath, those symptoms may get better when you quit. You may also get sick less often. If you are pregnant, quitting smoking can help to lower your chances of having a baby of low birth weight. What can I do to help me quit smoking? Talk with your doctor about what can help you quit smoking. Some things you can do (strategies) include:  Quitting smoking totally, instead of  slowly cutting back how much you smoke over a period of time.  Going to in-person counseling. You are more likely to quit if you go to many counseling sessions.  Using resources and support systems, such as:  Online chats with a Social worker.  Phone quitlines.  Printed Furniture conservator/restorer.  Support groups or group counseling.  Text messaging programs.  Mobile phone apps or applications.  Taking medicines. Some of these medicines may have nicotine in them. If you are pregnant or breastfeeding, do not take any medicines to quit smoking unless your doctor says it is okay. Talk with your doctor about counseling or other things that can help you. Talk with your doctor about using more than one strategy at the same time, such as taking medicines while you are also going to in-person counseling. This can help make quitting easier. What things can I do to make it easier to quit? Quitting smoking might feel very hard at first, but there is a lot that you can do to make it easier. Take these steps:  Talk to your family and friends. Ask them to support and encourage you.  Call  phone quitlines, reach out to support groups, or work with a Social worker.  Ask people who smoke to not smoke around you.  Avoid places that make you want (trigger) to smoke, such as:  Bars.  Parties.  Smoke-break areas at work.  Spend time with people who do not smoke.  Lower the stress in your life. Stress can make you want to smoke. Try these things to help your stress:  Getting regular exercise.  Deep-breathing exercises.  Yoga.  Meditating.  Doing a body scan. To do this, close your eyes, focus on one area of your body at a time from head to toe, and notice which parts of your body are tense. Try to relax the muscles in those areas.  Download or buy apps on your mobile phone or tablet that can help you stick to your quit plan. There are many free apps, such as QuitGuide from the [employer] Office manager for Disease Control and Prevention). You can find more support from smokefree.gov and other websites. This information is not intended to replace advice given to you by your health care provider. Make sure you discuss any questions you have with your health care provider. Document Released: 10/22/2008 Document Revised: 08/24/2015 Document Reviewed: 05/12/2014 Elsevier Interactive Patient Education  2017 [employer].

## 2016-01-17 NOTE — Progress Notes (Signed)
Tara Keller, is a 51 y.o. female  [id number]  [id number]  DOB - 11/25/1965  Chief Complaint  Patient presents with  . Arm Pain  . Vaginal Itching        Subjective:   Tara Keller is a 51 y.o. female here today for fu of dm2, osa not tolerant of her face cpap b/c of claustrophobia, morbid obesity, tob abuse and depression., last seen 08/30/15.  She has since destressed a lot and handling crisis better. Still going to psyche, Dr Leim Fabry, who prescribes her atarax, welbutrin and klonopin.  She denies si/hi/avh.  She admits to missing some of her am insulin dosing, shortacting, but has been taking her lasix. She notes her am fasting cbg typically run 230s, denies hypoglycemic events.  She was seen in ED on 01/13/16 for right otitis media, taking augmentin, and feels better. Denies diarrhea.  Also c/o of right hand weakness, esp when she goes and grabs something. She is right handed.  C/o of vaginal pruritis/itch, denies dysuria x 3 days.  No f/c.  Still smoking, would not quantify, but states less overall.  Denies etoh.  Patient has No headache, No chest pain, No abdominal pain - No Nausea, No new weakness tingling or numbness, No Cough - SOB.  No problems updated.  ALLERGIES: No Known Allergies  PAST MEDICAL HISTORY: Past Medical History:  Diagnosis Date  . Anxiety   . Arthritis   . Asthma   . Depression   . Diabetes mellitus   . Fatty liver   . Fracture closed of upper end of forearm 9 years, Fx left left leg  . Fracture of left lower leg @ 51 years old  . H/O tubal ligation   . Hiatal hernia   . Hypertension   . Obesity   . Positive H. pylori test   . Sleep apnea    uses CPAP  . Tubular adenoma of colon     MEDICATIONS AT HOME: Prior to Admission medications   Medication Sig Start Date End Date Taking? Authorizing Provider  ACCU-CHEK AVIVA PLUS test strip TEST three times a day 09/08/15   Maren Reamer, MD  ACCU-CHEK FASTCLIX LANCETS MISC Use as  directed 12/22/15   Maren Reamer, MD  amoxicillin-clavulanate (AUGMENTIN) 875-125 MG tablet Take 1 tablet by mouth 2 (two) times daily. 01/13/16   Hope Bunnie Pion, NP  buPROPion (WELLBUTRIN XL) 150 MG 24 hr tablet Take 450 mg by mouth daily.    Historical Provider, MD  clonazePAM (KLONOPIN) 1 MG tablet Take 1 mg by mouth at bedtime.    Historical Provider, MD  DULoxetine (CYMBALTA) 60 MG capsule Take 60 mg by mouth 2 (two) times daily.     Historical Provider, MD  fluconazole (DIFLUCAN) 150 MG tablet Take 1 tablet (150 mg total) by mouth daily. 01/17/16   Maren Reamer, MD  hydrOXYzine (VISTARIL) 25 MG capsule Take 4 capsules (100 mg total) by mouth 3 (three) times daily as needed for itching. Patient taking differently: Take 25 mg by mouth 3 (three) times daily as needed for itching.  02/25/14   Brayton Caves, PA-C  insulin aspart (NOVOLOG) 100 UNIT/ML FlexPen Inject 9 Units into the skin 3 (three) times daily with meals. 08/11/15   Tresa Garter, MD  Insulin Glargine (LANTUS SOLOSTAR) 100 UNIT/ML Solostar Pen Inject 28 Units into the skin daily at 10 pm. 01/17/16   Maren Reamer, MD  Insulin Pen Needle 31G X 8 MM MISC Use  as directed for 4 times daily insulin administration. 10/08/15   Maren Reamer, MD  Insulin Syringe-Needle U-100 (BD INSULIN SYRINGE ULTRAFINE) 31G X 15/64" 0.5 ML MISC Use as directed 08/11/15   Tresa Garter, MD  INVOKANA 300 MG TABS tablet Take 1 tablet (300 mg total) by mouth daily. 01/17/16   Maren Reamer, MD  metFORMIN (GLUCOPHAGE XR) 500 MG 24 hr tablet Take 2 tablets (1,000 mg total) by mouth 2 (two) times daily. 01/17/16   Maren Reamer, MD  metoCLOPramide (REGLAN) 10 MG tablet Take 1 tablet (10 mg total) by mouth every 6 (six) hours as needed for nausea. 08/30/15   Maren Reamer, MD  naproxen (NAPROSYN) 500 MG tablet Take 1 tablet (500 mg total) by mouth 2 (two) times daily. 01/13/16   Hope Bunnie Pion, NP  NEEDLE, DISP, 24 G (B-D DISP NEEDLE 24GX1") 24G X 1"  MISC Use with solostar pen to inject 4 times daily 07/09/14   Tresa Garter, MD  nystatin (MYCOSTATIN/NYSTOP) powder Apply topically 4 (four) times daily. 01/17/16   Maren Reamer, MD  pantoprazole (PROTONIX) 40 MG tablet Take 1 tablet (40 mg total) by mouth daily. 08/29/15   Recardo Evangelist, PA-C  ranitidine (ZANTAC) 150 MG tablet Take 1 tablet (150 mg total) by mouth 2 (two) times daily. Patient taking differently: Take 150 mg by mouth daily as needed for heartburn.  07/06/15   Maren Reamer, MD  sucralfate (CARAFATE) 1 GM/10ML suspension Take 10 mLs (1 g total) by mouth 4 (four) times daily -  with meals and at bedtime. 08/30/15   Maren Reamer, MD  Travoprost, BAK Free, (TRAVATAN) 0.004 % SOLN ophthalmic solution Place 1 drop into both eyes daily.     Historical Provider, MD  zolpidem (AMBIEN) 5 MG tablet Take 5 mg by mouth at bedtime.    Historical Provider, MD     Objective:   Vitals:   01/17/16 1339  BP: 131/83  Pulse: 80  Resp: 16  Temp: 97.9 F (36.6 C)  TempSrc: Oral  SpO2: 97%  Weight: 296 lb 3.2 oz (134.4 kg)   bmi 47.8    08/30/15 weight - 298  Exam General appearance : Awake, alert, not in any distress. Speech Clear. Not toxic looking, pleasant, morbid obese.  HEENT: Atraumatic and Normocephalic, pupils equally reactive to light.  Right ear w/ effusion but no erythema/nontender on exam, left tm clear. Neck: supple, no JVD.  Chest:Good air entry bilaterally, no added sounds. CVS: S1 S2 regular, no murmurs/gallups or rubs. Abdomen: Bowel sounds active, obese, Non tender and not distended with no gaurding, rigidity or rebound.  Foot exam: bilateral peripheral pulses 2+ (dorsalis pedis and post tibialis pulses), no ulcers noted/no ecchymosis, warm to touch, monofilament testing 2/3 bilat. Sensation intact.  No c/c/e.  Dry ashen feet, bilat onychomycosis Neurology: Awake alert, and oriented X 3, CN II-XII grossly intact, Non focal; bilat ue MS 5/5, very good  grip. Reflexes intact bilat ue.  No muscle atrophy noted bilat ue.  Neg phalen test.  Skin:No Rash  Data Review Lab Results  Component Value Date   HGBA1C 9.4 01/17/2016   HGBA1C 9.8 08/30/2015   HGBA1C 12.1 07/06/2015    Depression screen PHQ 2/9 01/17/2016 08/30/2015 07/06/2015 07/03/2014 06/01/2014  Decreased Interest 1 1 0 0 0  Down, Depressed, Hopeless 2 1 2  0 0  PHQ - 2 Score 3 2 2  0 0  Altered sleeping 0 0 0 - -  Tired, decreased energy 3 2 2  - -  Change in appetite 1 2 3  - -  Feeling bad or failure about yourself  1 3 1  - -  Trouble concentrating 0 2 2 - -  Moving slowly or fidgety/restless 0 1 0 - -  Suicidal thoughts 0 1 0 - -  PHQ-9 Score 8 13 10  - -  Difficult doing work/chores - - Very difficult - -      Assessment & Plan   1. Type 2 diabetes, uncontrolled, with neuropathy (HCC) Slight better controll - increase lantus to 28units qhs, dw pt w3 day self-titration of lantus, pt able to repeat instruction to me to voice understanding. Info provided. - Microalbumin/Creatinine Ratio, Urine - BASIC METABOLIC PANEL WITH GFR - Ambulatory referral to Podiatry - POCT glucose (manual entry) - POCT glycosylated hemoglobin (Hb A1C)  9.4 - fu w/ Erline Levine - pharm clinic 2 wks for dm chk.  2. Smoking Complete cessation recd  3. Candidiasis ? On abx - trial diflucan/nystatin powder - Urine cytology ancillary only - Urinalysis Dipstick  4. Thyroid disorder screen - TSH  5. Right arm weakness - etiology unclear, ?carpel tunnel, but does not quite fit picture, ?neuropathy related from dm? - her exam was unremarkable to me. - Ambulatory referral to Neurology  6. OSA (obstructive sleep apnea) Not tol of cpap face mask so not using. Will ask cm to see if can order her nasal mask or would need repeat sleep study.  7. Morbid obesity ([place]) - weight loss recd, increase exercise and watch carbs would help.     Patient have been counseled extensively about nutrition and  exercise  Return in about 3 months (around 04/16/2016), or if symptoms worsen or fail to improve.  The patient was given clear instructions to go to ER or return to medical center if symptoms don't improve, worsen or new problems develop. The patient verbalized understanding. The patient was told to call to get lab results if they haven't heard anything in the next week.   This note has been created with Surveyor, quantity. Any transcriptional errors are unintentional.   Maren Reamer, MD, [facility] [employer], [place]   01/17/2016, 2:41 PM

## 2016-01-18 ENCOUNTER — Telehealth: Payer: Self-pay

## 2016-01-18 LAB — MICROALBUMIN / CREATININE URINE RATIO
Creatinine, Urine: 179 mg/dL (ref 20–320)
MICROALB/CREAT RATIO: 6 ug/mg{creat} (ref <30)
Microalb, Ur: 1.1 mg/dL

## 2016-01-18 NOTE — Telephone Encounter (Signed)
Message received from Dr Janne Napoleon requesting assistance obtaining a nasal mask for the patient's CPAP.    Call placed to the patient and she said that her CPAP machine is from [facility] # [phone]. She noted that she has not used the machine in 3 years and is wondering if it needs to be serviced.  She said that she hasn't used it because she is not comfortable with the mask that she has.  Call placed to [facility], spoke to [place]. Who explained that the patient should schedule an appointment to come to their office to have the unit checked and they will also re-fit her mask. The local branch is at [address], [place], [place]. [facility] # [id number]. He noted that they have appointments available Wed/Thur/Fri this week. He noted that a prescription is not needed but they will contact the provider if anything is needed after assessing the patient's machine/mask.   Call placed to the patient and explained the information from [place] and encouraged her to make an appointment to have the CPAP checked and mask re-fit.  She said that she would call for an appointment.   Update provided to Dr Janne Napoleon.

## 2016-01-19 ENCOUNTER — Telehealth: Payer: Self-pay | Admitting: Internal Medicine

## 2016-01-19 ENCOUNTER — Telehealth: Payer: Self-pay

## 2016-01-19 DIAGNOSIS — G4733 Obstructive sleep apnea (adult) (pediatric): Secondary | ICD-10-CM

## 2016-01-19 DIAGNOSIS — Z9989 Dependence on other enabling machines and devices: Principal | ICD-10-CM

## 2016-01-19 LAB — URINE CYTOLOGY ANCILLARY ONLY
Chlamydia: NEGATIVE
Neisseria Gonorrhea: NEGATIVE
Trichomonas: NEGATIVE

## 2016-01-19 NOTE — Telephone Encounter (Signed)
Dr Janne Napoleon - please note that a new prescription is needed.

## 2016-01-19 NOTE — Telephone Encounter (Signed)
Pt is aware of results pt would like something internal for the itching and her walls are red

## 2016-01-19 NOTE — Telephone Encounter (Signed)
Pt. Was assessed by Huey Romans and per their request a new prescription for CPAP is needed.   Please fax [phone]

## 2016-01-20 LAB — URINE CYTOLOGY ANCILLARY ONLY
Bacterial vaginitis: NEGATIVE
Candida vaginitis: POSITIVE — AB

## 2016-01-24 ENCOUNTER — Ambulatory Visit: Payer: Medicaid Other | Admitting: Internal Medicine

## 2016-01-24 ENCOUNTER — Telehealth: Payer: Self-pay

## 2016-01-24 NOTE — Telephone Encounter (Signed)
Call placed to [facility] # [phone] to check on what documentation/supplies are needed for the CPAP for the patient. Spoke to [place] who stated that she will fax the sleep therapy order form to [facility] for the provider to complete. She noted that as per the patient's  prior sleep study the setting was 16 and the patient had a resmed quatro full face mask. Awaiting receipt of the fax.

## 2016-01-24 NOTE — Telephone Encounter (Signed)
Tara Keller - I do not have any sleep studies in records, would I need to reorder the cpap titration/fit test than? Since not certain what setting/face mask she needs. I put in the order for cpap titration. thanks

## 2016-01-25 NOTE — Telephone Encounter (Signed)
Fax received from [facility] was a Sleep Therapy Order Form.  Call placed to [place] # [phone] and spoke to Manuela Schwartz regarding the need for sleep apnea orders. After reviewing the patient's [facility]  record she said that there was no documentation of a need for a new CPAP machine but she was not clear what is needed for the patient and could not clarify  if the Sleep Therapy Order Form was needed.  Call placed to the patient to inquire what she needs from [place].  She said that all she needs is a nasal mask and [facility] just needs a prescription for the nasal mask.   Call placed to [place] # [phone]. Spoke to [place] and explained the need to clarify what it needed for the patient to obtain the nasal mask for her CPAP.  He also reviewed the record. He stated that a prescription from the provider is needed indicating the diagnosis and order for the nasal mask.  The prescription must also include the patient's full name, date of birth, address, phone # and included with the order must be documentation of a recent F2F encounter between the provider. The information should be faxed to # [phone]. He said that the [facility] branch will then contact the patient to schedule an appointment to re-fit her nasal mask.  He noted that if there are further questions, the sleep team can be contacted at # [phone].  Prescription for nasal mask signed by Dr Janne Napoleon and faxed to [facility] at the fax # noted above.

## 2016-02-02 ENCOUNTER — Ambulatory Visit: Payer: Medicaid Other | Attending: Internal Medicine | Admitting: Pharmacist

## 2016-02-02 DIAGNOSIS — E1165 Type 2 diabetes mellitus with hyperglycemia: Secondary | ICD-10-CM | POA: Insufficient documentation

## 2016-02-02 DIAGNOSIS — E1140 Type 2 diabetes mellitus with diabetic neuropathy, unspecified: Secondary | ICD-10-CM

## 2016-02-02 DIAGNOSIS — Z794 Long term (current) use of insulin: Secondary | ICD-10-CM | POA: Insufficient documentation

## 2016-02-02 DIAGNOSIS — IMO0002 Reserved for concepts with insufficient information to code with codable children: Secondary | ICD-10-CM

## 2016-02-02 MED ORDER — INSULIN ASPART 100 UNIT/ML FLEXPEN: 12.0000 [IU] | PEN_INJECTOR | Freq: Three times a day (TID) | SUBCUTANEOUS | 11 refills | Status: DC

## 2016-02-02 MED ORDER — INSULIN GLARGINE 100 UNIT/ML SOLOSTAR PEN: 40.0000 [IU] | PEN_INJECTOR | Freq: Every day | SUBCUTANEOUS | 2 refills | Status: DC

## 2016-02-02 NOTE — Progress Notes (Signed)
    S:    Patient arrives in good spirits.  Presents for diabetes evaluation, education, and management at the request of Dr. Janne Napoleon. Patient was referred on 01/17/15.  Patient was last seen by Primary Care Provider on 01/17/15.  Patient reports adherence with medications.  Current diabetes medications include: Lantus 35 units (was listed on med list as 28 units) and Novolog 12 units TID and Invokana 300 mg daily, and metformin 1000 mg BID.   Patient denies hypoglycemic events.  Patient reported dietary habits: tries to avoid carbs but doesn't completely.   Patient reported exercise habits: none   Patient denies nocturia.  Patient reports neuropathy. Patient denies visual changes. Patient reports self foot exams.   O:  Lab Results  Component Value Date   HGBA1C 9.4 01/17/2016   There were no vitals filed for this visit.  Home fasting CBG: 200s 2 hour post-prandial/random CBG: 200s-300s.   A/P: Diabetes longstanding currently UNcontrolled based on A1c of 9.4 and home CBGs. Patient denies hypoglycemic events and is able to verbalize appropriate hypoglycemia management plan. Patient reports adherence with medication. Control is suboptimal due to dietary indiscretion and sedentary lifestyle.  Increase Lantus to 40 units daily and increase Novolog to 12 units before each meal. Continue Invokana and metformin as prescribed. Patient to continue monitoring CBGs at home and we will adjust medication as needed at next visit. Provided education on decreased carb intake but patient was not very receptive and was disengaged during most of the visit.   Next A1C anticipated April 2018.    Written patient instructions provided.  Total time in face to face counseling 20 minutes.   Follow up in Pharmacist Clinic Visit in 2 weeks.   Patient seen with Windell Moulding, PharmD Candidate

## 2016-02-02 NOTE — Patient Instructions (Addendum)
Thanks for coming to see [name]!  Increase Lantus to 40 units daily  Increase Novolog to 12 units before each meal.  Come back in 2 weeks.   Carbohydrate Counting for Diabetes Mellitus, Adult Carbohydrate counting is a method for keeping track of how many carbohydrates you eat. Eating carbohydrates naturally increases the amount of sugar (glucose) in the blood. Counting how many carbohydrates you eat helps keep your blood glucose within normal limits, which helps you manage your diabetes (diabetes mellitus). It is important to know how many carbohydrates you can safely have in each meal. This is different for every person. A diet and nutrition specialist (registered dietitian) can help you make a meal plan and calculate how many carbohydrates you should have at each meal and snack. Carbohydrates are found in the following foods:  Grains, such as breads and cereals.  Dried beans and soy products.  Starchy vegetables, such as potatoes, peas, and corn.  Fruit and fruit juices.  Milk and yogurt.  Sweets and snack foods, such as cake, cookies, candy, chips, and soft drinks. How do I count carbohydrates? There are two ways to count carbohydrates in food. You can use either of the methods or a combination of both. Reading "Nutrition Facts" on packaged food  The "Nutrition Facts" list is included on the labels of almost all packaged foods and beverages in the [place] It includes:  The serving size.  Information about nutrients in each serving, including the grams (g) of carbohydrate per serving. To use the "Nutrition Facts":  Decide how many servings you will have.  Multiply the number of servings by the number of carbohydrates per serving.  The resulting number is the total amount of carbohydrates that you will be having. Learning standard serving sizes of other foods  When you eat foods containing carbohydrates that are not packaged or do not include "Nutrition Facts" on the label, you  need to measure the servings in order to count the amount of carbohydrates:  Measure the foods that you will eat with a food scale or measuring cup, if needed.  Decide how many standard-size servings you will eat.  Multiply the number of servings by 15. Most carbohydrate-rich foods have about 15 g of carbohydrates per serving.  For example, if you eat 8 oz (170 g) of strawberries, you will have eaten 2 servings and 30 g of carbohydrates (2 servings x 15 g = 30 g).  For foods that have more than one food mixed, such as soups and casseroles, you must count the carbohydrates in each food that is included. The following list contains standard serving sizes of common carbohydrate-rich foods. Each of these servings has about 15 g of carbohydrates:   hamburger bun or  English muffin.   oz (15 mL) syrup.   oz (14 g) jelly.  1 slice of bread.  1 six-inch tortilla.  3 oz (85 g) cooked rice or pasta.  4 oz (113 g) cooked dried beans.  4 oz (113 g) starchy vegetable, such as peas, corn, or potatoes.  4 oz (113 g) hot cereal.  4 oz (113 g) mashed potatoes or  of a large baked potato.  4 oz (113 g) canned or frozen fruit.  4 oz (120 mL) fruit juice.  4-6 crackers.  6 chicken nuggets.  6 oz (170 g) unsweetened dry cereal.  6 oz (170 g) plain fat-free yogurt or yogurt sweetened with artificial sweeteners.  8 oz (240 mL) milk.  8 oz (170 g) fresh  fruit or one small piece of fruit.  24 oz (680 g) popped popcorn. Example of carbohydrate counting Sample meal  3 oz (85 g) chicken breast.  6 oz (170 g) brown rice.  4 oz (113 g) corn.  8 oz (240 mL) milk.  8 oz (170 g) strawberries with sugar-free whipped topping. Carbohydrate calculation 1. Identify the foods that contain carbohydrates:  Rice.  Corn.  Milk.  Strawberries. 2. Calculate how many servings you have of each food:  2 servings rice.  1 serving corn.  1 serving milk.  1 serving  strawberries. 3. Multiply each number of servings by 15 g:  2 servings rice x 15 g = 30 g.  1 serving corn x 15 g = 15 g.  1 serving milk x 15 g = 15 g.  1 serving strawberries x 15 g = 15 g. 4. Add together all of the amounts to find the total grams of carbohydrates eaten:  30 g + 15 g + 15 g + 15 g = 75 g of carbohydrates total. This information is not intended to replace advice given to you by your health care provider. Make sure you discuss any questions you have with your health care provider. Document Released: 12/26/2004 Document Revised: 07/16/2015 Document Reviewed: 06/09/2015 Elsevier Interactive Patient Education  2017 [employer].

## 2016-02-04 ENCOUNTER — Encounter (HOSPITAL_COMMUNITY): Payer: Self-pay | Admitting: Emergency Medicine

## 2016-02-04 ENCOUNTER — Emergency Department (HOSPITAL_COMMUNITY)
Admission: EM | Admit: 2016-02-04 | Discharge: 2016-02-05 | Disposition: A | Discharge status: Home / Self Care | Payer: Medicaid Other | Attending: Emergency Medicine | Admitting: Emergency Medicine

## 2016-02-04 DIAGNOSIS — R0981 Nasal congestion: Secondary | ICD-10-CM | POA: Diagnosis present

## 2016-02-04 DIAGNOSIS — I10 Essential (primary) hypertension: Secondary | ICD-10-CM | POA: Diagnosis not present

## 2016-02-04 DIAGNOSIS — Z794 Long term (current) use of insulin: Secondary | ICD-10-CM | POA: Diagnosis not present

## 2016-02-04 DIAGNOSIS — Z85038 Personal history of other malignant neoplasm of large intestine: Secondary | ICD-10-CM | POA: Diagnosis not present

## 2016-02-04 DIAGNOSIS — E119 Type 2 diabetes mellitus without complications: Secondary | ICD-10-CM | POA: Diagnosis not present

## 2016-02-04 DIAGNOSIS — J45909 Unspecified asthma, uncomplicated: Secondary | ICD-10-CM | POA: Insufficient documentation

## 2016-02-04 DIAGNOSIS — Z79899 Other long term (current) drug therapy: Secondary | ICD-10-CM | POA: Insufficient documentation

## 2016-02-04 DIAGNOSIS — J019 Acute sinusitis, unspecified: Secondary | ICD-10-CM | POA: Insufficient documentation

## 2016-02-04 DIAGNOSIS — F17210 Nicotine dependence, cigarettes, uncomplicated: Secondary | ICD-10-CM | POA: Insufficient documentation

## 2016-02-04 LAB — CBG MONITORING, ED: GLUCOSE-CAPILLARY: 252 mg/dL — AB (ref 65–99)

## 2016-02-04 LAB — RAPID STREP SCREEN (MED CTR MEBANE ONLY): STREPTOCOCCUS, GROUP A SCREEN (DIRECT): NEGATIVE

## 2016-02-04 NOTE — ED Triage Notes (Signed)
Pt reports sore throat and head congestion for two days.  Denies fever, body aches and cough (does exhibit a cough), no SOB.

## 2016-02-05 ENCOUNTER — Emergency Department (HOSPITAL_COMMUNITY): Payer: Medicaid Other

## 2016-02-05 MED ORDER — AMOXICILLIN 500 MG PO CAPS: 1000.0000 mg | ORAL_CAPSULE | Freq: Two times a day (BID) | ORAL | 0 refills | Status: DC

## 2016-02-05 MED ORDER — AMOXICILLIN 500 MG PO CAPS
1000.0000 mg | ORAL_CAPSULE | Freq: Once | ORAL | Status: AC
Start: 2016-02-05 — End: 2016-02-05
  Administered 2016-02-05: 1000 mg via ORAL
  Filled 2016-02-05: qty 2

## 2016-02-05 NOTE — ED Notes (Signed)
Patient transported to X-ray 

## 2016-02-05 NOTE — ED Notes (Signed)
Pt departed in NAD, escorted by NT in a wheelchair.

## 2016-02-05 NOTE — ED Provider Notes (Signed)
[facility] DEPT Provider Note   CSN: [id number] Arrival date & time: 02/04/16  2259  By signing my name below, I, Jeanell Sparrow, attest that this documentation has been prepared under the direction and in the presence of Delora Fuel, MD. Electronically Signed: Jeanell Sparrow, Scribe. 02/05/2016. 1:14 AM.  History   Chief Complaint Chief Complaint  Patient presents with  . Nasal Congestion  . Sore Throat   The history is provided by the patient. No language interpreter was used.   HPI Comments: Tara Keller is a 51 y.o. female who presents to the Emergency Department complaining of intermittent moderate cough that started 2 days ago. She states has been having gradually worsening URI-like symptoms. She took theraflu without relief. She reports associated symptoms of cough (productive), diaphoresis, sinus pain, congestion (nasal),sore throat, and intermittent LUE pain radiating to her back. She admits to sick contacts and a hx of smoking. She denies any influenza vaccination this year, fever, chills, or other complaints    PCP: Maren Reamer, MD  Past Medical History:  Diagnosis Date  . Anxiety   . Arthritis   . Asthma   . Depression   . Diabetes mellitus   . Fatty liver   . Fracture closed of upper end of forearm 9 years, Fx left left leg  . Fracture of left lower leg @ 51 years old  . H/O tubal ligation   . Hiatal hernia   . Hypertension   . Obesity   . Positive H. pylori test   . Sleep apnea    uses CPAP  . Tubular adenoma of colon     Patient Active Problem List   Diagnosis Date Noted  . OSA on CPAP 07/06/2015  . Depression (emotion) 07/06/2015  . Abnormal barium swallow   . Abdominal pain, epigastric 08/15/2013  . Benign neoplasm of colon 08/15/2013  . Special screening for malignant neoplasms, colon 08/15/2013  . Hepatic steatosis 05/19/2013  . History of pancreatitis 05/19/2013  . Family history of colon cancer 05/19/2013  . GERD (gastroesophageal  reflux disease) 02/18/2013  . Smoking 02/18/2013  . Morbid obesity ([place]) 05/28/2012  . Type 2 diabetes mellitus ([place]) 05/28/2012  . Osteoarthritis of left and right knee 05/28/2012  . Generalized anxiety disorder 05/28/2012    Past Surgical History:  Procedure Laterality Date  . CHOLECYSTECTOMY    . COLONOSCOPY WITH PROPOFOL N/A 08/15/2013   Procedure: COLONOSCOPY WITH PROPOFOL;  Surgeon: Jerene Bears, MD;  Location: [facility] END[facility]COPY;  Service: Gastroenterology;  Laterality: N/A;  . ESOPHAGOGASTRODUODENOSCOPY N/A 05/12/2014   Procedure: ESOPHAGOGASTRODUODENOSCOPY (EGD);  Surgeon: Jerene Bears, MD;  Location: [facility];  Service: Gastroenterology;  Laterality: N/A;  . ESOPHAGOGASTRODUODENOSCOPY (EGD) WITH PROPOFOL N/A 08/15/2013   Procedure: ESOPHAGOGASTRODUODENOSCOPY (EGD) WITH PROPOFOL;  Surgeon: Jerene Bears, MD;  Location: [facility] ENDOSCOPY;  Service: Gastroenterology;  Laterality: N/A;  . TUBAL LIGATION Bilateral     OB History    Gravida Para Term Preterm AB Living   3 3 2 1   4    SAB TAB Ectopic Multiple Live Births         1         Home Medications    Prior to Admission medications   Medication Sig Start Date End Date Taking? Authorizing Provider  ACCU-CHEK AVIVA PLUS test strip TEST three times a day 09/08/15   Maren Reamer, MD  ACCU-CHEK FASTCLIX LANCETS MISC Use as directed 12/22/15   Maren Reamer, MD  amoxicillin-clavulanate (AUGMENTIN)  875-125 MG tablet Take 1 tablet by mouth 2 (two) times daily. 01/13/16   Hope Bunnie Pion, NP  buPROPion (WELLBUTRIN XL) 150 MG 24 hr tablet Take 450 mg by mouth daily.    Historical Provider, MD  clonazePAM (KLONOPIN) 1 MG tablet Take 1 mg by mouth at bedtime.    Historical Provider, MD  DULoxetine (CYMBALTA) 60 MG capsule Take 60 mg by mouth 2 (two) times daily.     Historical Provider, MD  fluconazole (DIFLUCAN) 150 MG tablet Take 1 tablet (150 mg total) by mouth daily. 01/17/16   Maren Reamer, MD  hydrOXYzine (VISTARIL) 25 MG capsule  Take 4 capsules (100 mg total) by mouth 3 (three) times daily as needed for itching. Patient taking differently: Take 25 mg by mouth 3 (three) times daily as needed for itching.  02/25/14   Brayton Caves, PA-C  insulin aspart (NOVOLOG) 100 UNIT/ML FlexPen Inject 12 Units into the skin 3 (three) times daily with meals. 02/02/16   Tresa Garter, MD  Insulin Glargine (LANTUS SOLOSTAR) 100 UNIT/ML Solostar Pen Inject 40 Units into the skin daily at 10 pm. 02/02/16   Tresa Garter, MD  Insulin Pen Needle 31G X 8 MM MISC Use as directed for 4 times daily insulin administration. 10/08/15   Maren Reamer, MD  Insulin Syringe-Needle U-100 (BD INSULIN SYRINGE ULTRAFINE) 31G X 15/64" 0.5 ML MISC Use as directed 08/11/15   Tresa Garter, MD  INVOKANA 300 MG TABS tablet Take 1 tablet (300 mg total) by mouth daily. 01/17/16   Maren Reamer, MD  metFORMIN (GLUCOPHAGE XR) 500 MG 24 hr tablet Take 2 tablets (1,000 mg total) by mouth 2 (two) times daily. 01/17/16   Maren Reamer, MD  metoCLOPramide (REGLAN) 10 MG tablet Take 1 tablet (10 mg total) by mouth every 6 (six) hours as needed for nausea. Patient not taking: Reported on 02/02/2016 08/30/15   Maren Reamer, MD  naproxen (NAPROSYN) 500 MG tablet Take 1 tablet (500 mg total) by mouth 2 (two) times daily. 01/13/16   Hope Bunnie Pion, NP  NEEDLE, DISP, 24 G (B-D DISP NEEDLE 24GX1") 24G X 1" MISC Use with solostar pen to inject 4 times daily 07/09/14   Tresa Garter, MD  nystatin (MYCOSTATIN/NYSTOP) powder Apply topically 4 (four) times daily. 01/17/16   Maren Reamer, MD  pantoprazole (PROTONIX) 40 MG tablet Take 1 tablet (40 mg total) by mouth daily. 08/29/15   Recardo Evangelist, PA-C  ranitidine (ZANTAC) 150 MG tablet Take 1 tablet (150 mg total) by mouth 2 (two) times daily. Patient not taking: Reported on 02/02/2016 07/06/15   Maren Reamer, MD  sucralfate (CARAFATE) 1 GM/10ML suspension Take 10 mLs (1 g total) by mouth 4 (four) times daily  -  with meals and at bedtime. Patient not taking: Reported on 02/02/2016 08/30/15   Maren Reamer, MD  Travoprost, BAK Free, (TRAVATAN) 0.004 % SOLN ophthalmic solution Place 1 drop into both eyes daily.     Historical Provider, MD  zolpidem (AMBIEN) 5 MG tablet Take 5 mg by mouth at bedtime.    Historical Provider, MD    Family History Family History  Problem Relation Age of Onset  . Diabetes Mother   . Stroke Mother   . Hypertension Mother   . Cancer Sister     Social History Social History  Substance Use Topics  . Smoking status: Current Every Day Smoker    Packs/day: 0.25  Types: Cigarettes  . Smokeless tobacco: Never Used  . Alcohol use No     Allergies   Patient has no known allergies.   Review of Systems Review of Systems  Constitutional: Positive for diaphoresis. Negative for chills and fever.  HENT: Positive for congestion (Nasal), sinus pain and sore throat.   Respiratory: Positive for cough (Productive).   Gastrointestinal: Positive for vomiting.  Musculoskeletal: Positive for back pain and myalgias (LUE).  All other systems reviewed and are negative.    Physical Exam Updated Vital Signs BP 128/83 (BP Location: Left Arm)   Pulse 111   Temp 99.3 F (37.4 C) (Oral)   Resp 16   Ht 5\' 6"  (1.676 m)   Wt (!) 304 lb 11.2 oz (138.2 kg)   SpO2 99%   BMI 49.18 kg/m   Physical Exam  Constitutional: She is oriented to person, place, and time. She appears well-developed and well-nourished.  HENT:  Head: Normocephalic and atraumatic.  TTP over maxillary and frontal sinuses.   Eyes: EOM are normal. Pupils are equal, round, and reactive to light.  Neck: Normal range of motion. Neck supple. No JVD present.  Cardiovascular: Normal rate, regular rhythm and normal heart sounds.   No murmur heard. Pulmonary/Chest: Effort normal and breath sounds normal. She has no wheezes. She has no rales. She exhibits no tenderness.  Abdominal: Soft. Bowel sounds are normal.  She exhibits no distension and no mass. There is no tenderness.  Musculoskeletal: Normal range of motion.  Trace pre-tibial edema.   Lymphadenopathy:    She has no cervical adenopathy.  Neurological: She is alert and oriented to person, place, and time. No cranial nerve deficit. She exhibits normal muscle tone. Coordination normal.  Skin: Skin is warm and dry. No rash noted.  Psychiatric: She has a normal mood and affect. Her behavior is normal. Judgment and thought content normal.  Nursing note and vitals reviewed.    ED Treatments / Results  DIAGNOSTIC STUDIES: Oxygen Saturation is 99% on RA, normal by my interpretation.    COORDINATION OF CARE: 1:20 AM- Pt advised of plan for treatment and pt agrees.  Labs (all labs ordered are listed, but only abnormal results are displayed) Labs Reviewed  CBG MONITORING, ED - Abnormal; Notable for the following:       Result Value   Glucose-Capillary 252 (*)    All other components within normal limits  RAPID STREP SCREEN (NOT AT [facility])  CULTURE, GROUP A STREP [facility])    Radiology Dg Chest 2 View  Result Date: 02/05/2016 CLINICAL DATA:  Fever, cough and congestion EXAM: CHEST  2 VIEW COMPARISON:  08/29/2015 FINDINGS: Mild diffuse interstitial prominence consistent with bronchitic change. No alveolar consolidation. Heart and mediastinal contours are within normal limits. No acute nor suspicious osseous abnormalities. IMPRESSION: Mild diffuse increase interstitial prominence since prior exam suspicious for bronchitic change. Electronically Signed   By: Ashley Royalty M.D.   On: 02/05/2016 01:48    Procedures Procedures (including critical care time)  Medications Ordered in ED Medications  amoxicillin (AMOXIL) capsule 1,000 mg (not administered)     Initial Impression / Assessment and Plan / ED Course  I have reviewed the triage vital signs and the nursing notes.  Pertinent labs & imaging results that were available during my care of the  patient were reviewed by me and considered in my medical decision making (see chart for details).  Upper respiratory infection with findings suggestive of acute sinusitis. Strep screen is negative. Chest  x-ray shows bronchitic changes. Old records are reviewed, and she has no relevant past visits, but is managed as an outpatient for diabetes mellitus. She is discharged with prescription for amoxicillin and his device to drink plenty of fluids and take over-the-counter analgesics and antipyretics as needed. Follow-up with PCP if not showing clinical improvement in several days.  Final Clinical Impressions(s) / ED Diagnoses   Final diagnoses:  Acute non-recurrent sinusitis, unspecified location    New Prescriptions New Prescriptions   AMOXICILLIN (AMOXIL) 500 MG CAPSULE    Take 2 capsules (1,000 mg total) by mouth 2 (two) times daily.   I personally performed the services described in this documentation, which was scribed in my presence. The recorded information has been reviewed and is accurate.      Delora Fuel, MD [id number]

## 2016-02-05 NOTE — ED Notes (Signed)
Pt appears to be very congested and diaphoretic and has a bad cough. Says this has been going on for 2 days, Pt stated she feels pressure all in her eyes and face. Pt. stated there is tingling in the right arm.

## 2016-02-07 LAB — CULTURE, GROUP A STREP (THRC)

## 2016-02-11 ENCOUNTER — Ambulatory Visit: Payer: Medicaid Other | Admitting: Neurology

## 2016-02-16 ENCOUNTER — Ambulatory Visit: Payer: Medicaid Other | Admitting: Pharmacist

## 2016-02-17 ENCOUNTER — Emergency Department (HOSPITAL_COMMUNITY)
Admission: EM | Admit: 2016-02-17 | Discharge: 2016-02-17 | Disposition: A | Discharge status: Home / Self Care | Payer: Medicaid Other | Attending: Physician Assistant | Admitting: Physician Assistant

## 2016-02-17 ENCOUNTER — Encounter (HOSPITAL_COMMUNITY): Payer: Self-pay

## 2016-02-17 DIAGNOSIS — J029 Acute pharyngitis, unspecified: Secondary | ICD-10-CM | POA: Diagnosis not present

## 2016-02-17 DIAGNOSIS — R6889 Other general symptoms and signs: Secondary | ICD-10-CM

## 2016-02-17 DIAGNOSIS — E119 Type 2 diabetes mellitus without complications: Secondary | ICD-10-CM | POA: Insufficient documentation

## 2016-02-17 DIAGNOSIS — F17210 Nicotine dependence, cigarettes, uncomplicated: Secondary | ICD-10-CM | POA: Diagnosis not present

## 2016-02-17 DIAGNOSIS — J45909 Unspecified asthma, uncomplicated: Secondary | ICD-10-CM | POA: Diagnosis not present

## 2016-02-17 DIAGNOSIS — Z79899 Other long term (current) drug therapy: Secondary | ICD-10-CM | POA: Insufficient documentation

## 2016-02-17 DIAGNOSIS — I10 Essential (primary) hypertension: Secondary | ICD-10-CM | POA: Diagnosis not present

## 2016-02-17 DIAGNOSIS — Z794 Long term (current) use of insulin: Secondary | ICD-10-CM | POA: Diagnosis not present

## 2016-02-17 LAB — RAPID STREP SCREEN (MED CTR MEBANE ONLY): STREPTOCOCCUS, GROUP A SCREEN (DIRECT): NEGATIVE

## 2016-02-17 MED ORDER — OSELTAMIVIR PHOSPHATE 75 MG PO CAPS: 75.0000 mg | ORAL_CAPSULE | Freq: Two times a day (BID) | ORAL | 0 refills | Status: AC

## 2016-02-17 NOTE — ED Provider Notes (Signed)
[facility] DEPT Provider Note   CSN: [id number] Arrival date & time: 02/17/16  1208  By signing my name below, I, Ethelle Lyon Long, attest that this documentation has been prepared under the direction and in the presence of Jamal Haskin B. Alroy Dust, PA-C. Electronically Signed: Ethelle Lyon Long, Scribe. 02/17/2016. 4:58 PM.   History   Chief Complaint Chief Complaint  Patient presents with  . Nasal Congestion  . Sore Throat    The history is provided by the patient. No language interpreter was used.    HPI Comments:  Tara Keller is a morbidly obese 52 y.o. female with a PMHx of DM, Asthma, and HTN,  who presents to the Emergency Department complaining of constant, gradually worsening, sore throat onset this morning. She notes she woke up at 5:00am with the sore throat which has been gradually worsening since onset. She has associated symptoms of cough, generalized body aches, nasal congestion, left ear pain, diarrhea qualified as watery, subjective fever, chills, and generalized abdominal pain. Pt notes being seen at [facility] one week ago, Rx'd Abx for sinus infection with relief of her symptoms after taking whole course of Abx. These symptoms resolved prior to this morning's onset of symptoms. Pt notes she hasn't been able to sleep very well and has decreased PO intake. Pt is currently in school at local community college. Pt denies nausea, vomiting, hematochezia, diaphoresis, and any other associated symptoms at this time.   Past Medical History:  Diagnosis Date  . Anxiety   . Arthritis   . Asthma   . Depression   . Diabetes mellitus   . Fatty liver   . Fracture closed of upper end of forearm 9 years, Fx left left leg  . Fracture of left lower leg @ 51 years old  . H/O tubal ligation   . Hiatal hernia   . Hypertension   . Obesity   . Positive H. pylori test   . Sleep apnea    uses CPAP  . Tubular adenoma of colon     Patient Active Problem List   Diagnosis Date Noted  .  OSA on CPAP 07/06/2015  . Depression (emotion) 07/06/2015  . Abnormal barium swallow   . Abdominal pain, epigastric 08/15/2013  . Benign neoplasm of colon 08/15/2013  . Special screening for malignant neoplasms, colon 08/15/2013  . Hepatic steatosis 05/19/2013  . History of pancreatitis 05/19/2013  . Family history of colon cancer 05/19/2013  . GERD (gastroesophageal reflux disease) 02/18/2013  . Smoking 02/18/2013  . Morbid obesity ([place]) 05/28/2012  . Type 2 diabetes mellitus ([place]) 05/28/2012  . Osteoarthritis of left and right knee 05/28/2012  . Generalized anxiety disorder 05/28/2012    Past Surgical History:  Procedure Laterality Date  . CHOLECYSTECTOMY    . COLONOSCOPY WITH PROPOFOL N/A 08/15/2013   Procedure: COLONOSCOPY WITH PROPOFOL;  Surgeon: Jerene Bears, MD;  Location: [facility];  Service: Gastroenterology;  Laterality: N/A;  . ESOPHAGOGASTRODUODENOSCOPY N/A 05/12/2014   Procedure: ESOPHAGOGASTRODUODENOSCOPY (EGD);  Surgeon: Jerene Bears, MD;  Location: [facility];  Service: Gastroenterology;  Laterality: N/A;  . ESOPHAGOGASTRODUODENOSCOPY (EGD) WITH PROPOFOL N/A 08/15/2013   Procedure: ESOPHAGOGASTRODUODENOSCOPY (EGD) WITH PROPOFOL;  Surgeon: Jerene Bears, MD;  Location: [facility];  Service: Gastroenterology;  Laterality: N/A;  . TUBAL LIGATION Bilateral     OB History    Gravida Para Term Preterm AB Living   3 3 2 1   4    SAB TAB Ectopic Multiple Live Births  1         Home Medications    Prior to Admission medications   Medication Sig Start Date End Date Taking? Authorizing Provider  ACCU-CHEK AVIVA PLUS test strip TEST three times a day 09/08/15   Maren Reamer, MD  ACCU-CHEK FASTCLIX LANCETS MISC Use as directed 12/22/15   Maren Reamer, MD  amoxicillin (AMOXIL) 500 MG capsule Take 2 capsules (1,000 mg total) by mouth 2 (two) times daily. [id number]   Delora Fuel, MD  amoxicillin-clavulanate (AUGMENTIN) 875-125 MG tablet Take 1 tablet by mouth 2  (two) times daily. 01/13/16   Hope Bunnie Pion, NP  buPROPion (WELLBUTRIN XL) 150 MG 24 hr tablet Take 450 mg by mouth daily.    Historical Provider, MD  clonazePAM (KLONOPIN) 1 MG tablet Take 1 mg by mouth at bedtime.    Historical Provider, MD  DULoxetine (CYMBALTA) 60 MG capsule Take 60 mg by mouth 2 (two) times daily.     Historical Provider, MD  fluconazole (DIFLUCAN) 150 MG tablet Take 1 tablet (150 mg total) by mouth daily. 01/17/16   Maren Reamer, MD  hydrOXYzine (VISTARIL) 25 MG capsule Take 4 capsules (100 mg total) by mouth 3 (three) times daily as needed for itching. Patient taking differently: Take 25 mg by mouth 3 (three) times daily as needed for itching.  02/25/14   Brayton Caves, PA-C  insulin aspart (NOVOLOG) 100 UNIT/ML FlexPen Inject 12 Units into the skin 3 (three) times daily with meals. 02/02/16   Tresa Garter, MD  Insulin Glargine (LANTUS SOLOSTAR) 100 UNIT/ML Solostar Pen Inject 40 Units into the skin daily at 10 pm. 02/02/16   Tresa Garter, MD  Insulin Pen Needle 31G X 8 MM MISC Use as directed for 4 times daily insulin administration. 10/08/15   Maren Reamer, MD  Insulin Syringe-Needle U-100 (BD INSULIN SYRINGE ULTRAFINE) 31G X 15/64" 0.5 ML MISC Use as directed 08/11/15   Tresa Garter, MD  INVOKANA 300 MG TABS tablet Take 1 tablet (300 mg total) by mouth daily. 01/17/16   Maren Reamer, MD  metFORMIN (GLUCOPHAGE XR) 500 MG 24 hr tablet Take 2 tablets (1,000 mg total) by mouth 2 (two) times daily. 01/17/16   Maren Reamer, MD  metoCLOPramide (REGLAN) 10 MG tablet Take 1 tablet (10 mg total) by mouth every 6 (six) hours as needed for nausea. Patient not taking: Reported on 02/02/2016 08/30/15   Maren Reamer, MD  naproxen (NAPROSYN) 500 MG tablet Take 1 tablet (500 mg total) by mouth 2 (two) times daily. 01/13/16   Hope Bunnie Pion, NP  NEEDLE, DISP, 24 G (B-D DISP NEEDLE 24GX1") 24G X 1" MISC Use with solostar pen to inject 4 times daily 07/09/14   Tresa Garter, MD  nystatin (MYCOSTATIN/NYSTOP) powder Apply topically 4 (four) times daily. 01/17/16   Maren Reamer, MD  oseltamivir (TAMIFLU) 75 MG capsule Take 1 capsule (75 mg total) by mouth every 12 (twelve) hours. 02/17/16 02/22/16  Emeline General, PA-C  pantoprazole (PROTONIX) 40 MG tablet Take 1 tablet (40 mg total) by mouth daily. 08/29/15   Recardo Evangelist, PA-C  ranitidine (ZANTAC) 150 MG tablet Take 1 tablet (150 mg total) by mouth 2 (two) times daily. Patient not taking: Reported on 02/02/2016 07/06/15   Maren Reamer, MD  sucralfate (CARAFATE) 1 GM/10ML suspension Take 10 mLs (1 g total) by mouth 4 (four) times daily -  with meals and at bedtime.  Patient not taking: Reported on 02/02/2016 08/30/15   Maren Reamer, MD  Travoprost, BAK Free, (TRAVATAN) 0.004 % SOLN ophthalmic solution Place 1 drop into both eyes daily.     Historical Provider, MD  zolpidem (AMBIEN) 5 MG tablet Take 5 mg by mouth at bedtime.    Historical Provider, MD    Family History Family History  Problem Relation Age of Onset  . Diabetes Mother   . Stroke Mother   . Hypertension Mother   . Cancer Sister     Social History Social History  Substance Use Topics  . Smoking status: Current Every Day Smoker    Packs/day: 0.25    Types: Cigarettes  . Smokeless tobacco: Never Used  . Alcohol use No     Allergies   Patient has no known allergies.   Review of Systems Review of Systems  Constitutional: Positive for appetite change, chills and fever (resolved). Negative for diaphoresis.  HENT: Positive for congestion, ear pain (left ear) and sore throat. Negative for facial swelling.   Respiratory: Positive for cough. Negative for chest tightness, shortness of breath and wheezing.   Cardiovascular: Negative for chest pain and leg swelling.  Gastrointestinal: Positive for diarrhea. Negative for abdominal distention, blood in stool, nausea and vomiting. Abdominal pain: generalized.       Same achiness as  for the rest of her body and no abdominal pain per se  Genitourinary: Negative for difficulty urinating, dysuria, flank pain and hematuria.  Musculoskeletal: Positive for myalgias (generalized). Negative for back pain, gait problem, neck pain and neck stiffness.  Skin: Negative for pallor, rash and wound.  Neurological: Negative for headaches.  Psychiatric/Behavioral: The patient is not nervous/anxious.      Physical Exam Updated Vital Signs BP 154/97 (BP Location: Left Arm)   Pulse 90   Temp 98.1 F (36.7 C) (Oral)   Resp 22   Ht 5\' 6"  (1.676 m)   Wt 135.9 kg   SpO2 99%   BMI 48.36 kg/m   Physical Exam  Constitutional: She appears well-developed and well-nourished. No distress.   Patient is afebrile without antipyretics, non-toxic appearing, seating comfortably in chair in no acute distress.   HENT:  Head: Normocephalic and atraumatic.  Right Ear: External ear normal.  Left Ear: External ear normal.  Mouth/Throat: Oropharynx is clear and moist. No oropharyngeal exudate.  Eyes: Conjunctivae and EOM are normal. Pupils are equal, round, and reactive to light. Right eye exhibits no discharge. Left eye exhibits no discharge. No scleral icterus.  Neck: Normal range of motion. Neck supple. No thyromegaly present.  Cardiovascular: Normal rate, regular rhythm, normal heart sounds and intact distal pulses.   No murmur heard. Pulmonary/Chest: Effort normal and breath sounds normal. No stridor. No respiratory distress. She has no wheezes. She has no rales. She exhibits no tenderness.  Abdominal: Soft. Bowel sounds are normal. She exhibits no distension. There is no tenderness. There is no rebound and no guarding.  Musculoskeletal: She exhibits no edema or tenderness.  Lymphadenopathy:    She has no cervical adenopathy.  Neurological: She is alert. Coordination normal.  Skin: Skin is warm and dry. No rash noted. She is not diaphoretic. No pallor.  Psychiatric: She has a normal mood and  affect.  Nursing note and vitals reviewed.    ED Treatments / Results  DIAGNOSTIC STUDIES:  Oxygen Saturation is 99% on RA, normal by my interpretation.    COORDINATION OF CARE:  3:38 PM Discussed treatment plan with pt at  bedside including strep test and potential Tamiflu Tx and pt agreed to plan.  Labs (all labs ordered are listed, but only abnormal results are displayed) Labs Reviewed  RAPID STREP SCREEN (NOT AT [facility])  CULTURE, GROUP A STREP [facility])    EKG  EKG Interpretation None       Radiology No results found.  Procedures Procedures (including critical care time)  Medications Ordered in ED Medications - No data to display   Initial Impression / Assessment and Plan / ED Course  I have reviewed the triage vital signs and the nursing notes.  Pertinent labs & imaging results that were available during my care of the patient were reviewed by me and considered in my medical decision making (see chart for details).     Patient with symptoms consistent with influenza. Vitals are stable. No signs of dehydration, tolerating PO's.  Lungs are clear. Due to patient's presentation and physical exam a chest x-ray was not ordered bc likely diagnosis of flu and cough since today.  Discussed the cost versus benefit of Tamiflu treatment with the patient as patient is within the window for treatment. Patient will be discharged with instructions to orally hydrate, rest, and use over-the-counter medications such as anti-inflammatories ibuprofen and Aleve for muscle aches and Tylenol for fever.    Exam is otherwise reassuring and unremarkable. Lungs are clear and equal bilaterally with no wheezing or adventitious sounds.  Strep negative  Discharge home with close follow-up with PCP, tamiflu and symptomatic relief.  Discussed strict return precautions. Patient was advised to return to the emergency department if experiencing any new or worsening symptoms. Patient clearly understood  instructions and agreed with discharge plan.  Final Clinical Impressions(s) / ED Diagnoses   Final diagnoses:  Pharyngitis, unspecified etiology  Flu-like symptoms    New Prescriptions New Prescriptions   OSELTAMIVIR (TAMIFLU) 75 MG CAPSULE    Take 1 capsule (75 mg total) by mouth every 12 (twelve) hours.   I personally performed the services described in this documentation, which was scribed in my presence. The recorded information has been reviewed and is accurate.    Emeline General, PA-C 02/17/16 1703    Emeline General, PA-C 02/17/16 [place], MD 02/21/16 [id number]

## 2016-02-17 NOTE — ED Triage Notes (Signed)
Pt with nasal congestion, sore throat, cough x 1 week.  Went to md x 1 week ago.  Given antibiotics.  No change.

## 2016-02-17 NOTE — Discharge Instructions (Signed)
Please stay well hydrated. Use over-the-counter Tylenol for pain. Please take the entire course of Tamiflu for the next 5 days. Symptoms should begin to improve. Please return to the emergency department if experiencing any worsening of current symptoms, fever, chills, increased pain, nausea, vomiting or any other concerning symptoms.  Follow-up to primary care provider as needed.

## 2016-02-20 LAB — CULTURE, GROUP A STREP (THRC)

## 2016-02-22 ENCOUNTER — Ambulatory Visit: Payer: Medicaid Other | Admitting: Pharmacist

## 2016-03-12 ENCOUNTER — Emergency Department (HOSPITAL_COMMUNITY)
Admission: EM | Admit: 2016-03-12 | Discharge: 2016-03-13 | Disposition: A | Discharge status: Home / Self Care | Payer: Medicaid Other | Attending: Emergency Medicine | Admitting: Emergency Medicine

## 2016-03-12 ENCOUNTER — Encounter (HOSPITAL_COMMUNITY): Payer: Self-pay | Admitting: Oncology

## 2016-03-12 ENCOUNTER — Emergency Department (HOSPITAL_COMMUNITY): Payer: Medicaid Other

## 2016-03-12 DIAGNOSIS — J40 Bronchitis, not specified as acute or chronic: Secondary | ICD-10-CM | POA: Diagnosis not present

## 2016-03-12 DIAGNOSIS — Z85038 Personal history of other malignant neoplasm of large intestine: Secondary | ICD-10-CM | POA: Insufficient documentation

## 2016-03-12 DIAGNOSIS — E119 Type 2 diabetes mellitus without complications: Secondary | ICD-10-CM | POA: Diagnosis not present

## 2016-03-12 DIAGNOSIS — R0602 Shortness of breath: Secondary | ICD-10-CM | POA: Diagnosis present

## 2016-03-12 DIAGNOSIS — Z79899 Other long term (current) drug therapy: Secondary | ICD-10-CM | POA: Diagnosis not present

## 2016-03-12 DIAGNOSIS — Z794 Long term (current) use of insulin: Secondary | ICD-10-CM | POA: Insufficient documentation

## 2016-03-12 DIAGNOSIS — F17210 Nicotine dependence, cigarettes, uncomplicated: Secondary | ICD-10-CM | POA: Insufficient documentation

## 2016-03-12 LAB — CBC
HCT: 36.9 % (ref 36.0–46.0)
Hemoglobin: 11.9 g/dL — ABNORMAL LOW (ref 12.0–15.0)
MCH: 27.5 pg (ref 26.0–34.0)
MCHC: 32.2 g/dL (ref 30.0–36.0)
MCV: 85.2 fL (ref 78.0–100.0)
Platelets: 413 10*3/uL — ABNORMAL HIGH (ref 150–400)
RBC: 4.33 MIL/uL (ref 3.87–5.11)
RDW: 14 % (ref 11.5–15.5)
WBC: 12.9 10*3/uL — ABNORMAL HIGH (ref 4.0–10.5)

## 2016-03-12 LAB — I-STAT TROPONIN, ED: Troponin i, poc: 0 ng/mL (ref 0.00–0.08)

## 2016-03-12 LAB — BASIC METABOLIC PANEL
ANION GAP: 12 (ref 5–15)
BUN: 5 mg/dL — ABNORMAL LOW (ref 6–20)
CHLORIDE: 99 mmol/L — AB (ref 101–111)
CO2: 27 mmol/L (ref 22–32)
Calcium: 9.4 mg/dL (ref 8.9–10.3)
Creatinine, Ser: 0.63 mg/dL (ref 0.44–1.00)
GFR calc Af Amer: >60 mL/min (ref >60)
GLUCOSE: 229 mg/dL — AB (ref 65–99)
POTASSIUM: 3.2 mmol/L — AB (ref 3.5–5.1)
Sodium: 138 mmol/L (ref 135–145)

## 2016-03-12 MED ORDER — IPRATROPIUM-ALBUTEROL 0.5-2.5 (3) MG/3ML IN SOLN
3.0000 mL | Freq: Once | RESPIRATORY_TRACT | Status: AC
  Administered 2016-03-13: 3 mL via RESPIRATORY_TRACT
  Filled 2016-03-12: qty 3

## 2016-03-12 MED ORDER — ALBUTEROL SULFATE (2.5 MG/3ML) 0.083% IN NEBU: 5.0000 mg | INHALATION_SOLUTION | Freq: Once | RESPIRATORY_TRACT | Status: DC

## 2016-03-12 NOTE — ED Notes (Signed)
Patient transported to X-ray 

## 2016-03-12 NOTE — ED Triage Notes (Signed)
Pt c/o cough since yesterday.  Per pt cough is strong and dry.  Pt c/o shob, is speaking in full sentences.

## 2016-03-12 NOTE — ED Provider Notes (Signed)
[facility] DEPT Provider Note   CSN: [id number] Arrival date & time: 03/12/16  2246     History   Chief Complaint Chief Complaint  Patient presents with  . Shortness of Breath    HPI Tara Keller is a 51 y.o. female   Patient complains of dyspnea. Symptoms began 3 days ago. Symptoms have been gradually worsening since that time.The cough is dry and is aggravated by nothing. Associated symptoms include: chest pain WITH COUGH and wheezing. . Patient does have a history of asthma. Patient does not have a history of environmental allergens. Patient has not traveled recently. Patient is a smoker.   The history is provided by the patient and medical records. No language interpreter was used.  Shortness of Breath  This is a new problem. The current episode started more than 2 days ago. The problem has not changed since onset.Associated symptoms include cough. Pertinent negatives include no fever, no rhinorrhea, no sore throat, no PND, no orthopnea, no vomiting, no abdominal pain and no leg swelling. She has tried nothing for the symptoms. Associated medical issues include asthma. Associated medical issues comments: SMOKER.    Past Medical History:  Diagnosis Date  . Anxiety   . Arthritis   . Asthma   . Depression   . Diabetes mellitus   . Fatty liver   . Fracture closed of upper end of forearm 9 years, Fx left left leg  . Fracture of left lower leg @ 51 years old  . H/O tubal ligation   . Hiatal hernia   . Hypertension   . Obesity   . Positive H. pylori test   . Sleep apnea    uses CPAP  . Tubular adenoma of colon     Patient Active Problem List   Diagnosis Date Noted  . OSA on CPAP 07/06/2015  . Depression (emotion) 07/06/2015  . Abnormal barium swallow   . Abdominal pain, epigastric 08/15/2013  . Benign neoplasm of colon 08/15/2013  . Special screening for malignant neoplasms, colon 08/15/2013  . Hepatic steatosis 05/19/2013  . History of pancreatitis 05/19/2013    . Family history of colon cancer 05/19/2013  . GERD (gastroesophageal reflux disease) 02/18/2013  . Smoking 02/18/2013  . Morbid obesity ([place]) 05/28/2012  . Type 2 diabetes mellitus ([place]) 05/28/2012  . Osteoarthritis of left and right knee 05/28/2012  . Generalized anxiety disorder 05/28/2012    Past Surgical History:  Procedure Laterality Date  . CHOLECYSTECTOMY    . COLONOSCOPY WITH PROPOFOL N/A 08/15/2013   Procedure: COLONOSCOPY WITH PROPOFOL;  Surgeon: Jerene Bears, MD;  Location: [facility];  Service: Gastroenterology;  Laterality: N/A;  . ESOPHAGOGASTRODUODENOSCOPY N/A 05/12/2014   Procedure: ESOPHAGOGASTRODUODENOSCOPY (EGD);  Surgeon: Jerene Bears, MD;  Location: [facility];  Service: Gastroenterology;  Laterality: N/A;  . ESOPHAGOGASTRODUODENOSCOPY (EGD) WITH PROPOFOL N/A 08/15/2013   Procedure: ESOPHAGOGASTRODUODENOSCOPY (EGD) WITH PROPOFOL;  Surgeon: Jerene Bears, MD;  Location: [facility];  Service: Gastroenterology;  Laterality: N/A;  . TUBAL LIGATION Bilateral     OB History    Gravida Para Term Preterm AB Living   3 3 2 1   4    SAB TAB Ectopic Multiple Live Births         1         Home Medications    Prior to Admission medications   Medication Sig Start Date End Date Taking? Authorizing Provider  ACCU-CHEK AVIVA PLUS test strip TEST three times a day 09/08/15   [facility]  Lazarus Gowda, MD  ACCU-CHEK FASTCLIX LANCETS MISC Use as directed 12/22/15   Maren Reamer, MD  amoxicillin (AMOXIL) 500 MG capsule Take 2 capsules (1,000 mg total) by mouth 2 (two) times daily. [id number]   Delora Fuel, MD  amoxicillin-clavulanate (AUGMENTIN) 875-125 MG tablet Take 1 tablet by mouth 2 (two) times daily. 01/13/16   Hope Bunnie Pion, NP  buPROPion (WELLBUTRIN XL) 150 MG 24 hr tablet Take 450 mg by mouth daily.    Historical Provider, MD  clonazePAM (KLONOPIN) 1 MG tablet Take 1 mg by mouth at bedtime.    Historical Provider, MD  DULoxetine (CYMBALTA) 60 MG capsule Take 60 mg by mouth 2  (two) times daily.     Historical Provider, MD  fluconazole (DIFLUCAN) 150 MG tablet Take 1 tablet (150 mg total) by mouth daily. 01/17/16   Maren Reamer, MD  hydrOXYzine (VISTARIL) 25 MG capsule Take 4 capsules (100 mg total) by mouth 3 (three) times daily as needed for itching. Patient taking differently: Take 25 mg by mouth 3 (three) times daily as needed for itching.  02/25/14   Brayton Caves, PA-C  insulin aspart (NOVOLOG) 100 UNIT/ML FlexPen Inject 12 Units into the skin 3 (three) times daily with meals. 02/02/16   Tresa Garter, MD  Insulin Glargine (LANTUS SOLOSTAR) 100 UNIT/ML Solostar Pen Inject 40 Units into the skin daily at 10 pm. 02/02/16   Tresa Garter, MD  Insulin Pen Needle 31G X 8 MM MISC Use as directed for 4 times daily insulin administration. 10/08/15   Maren Reamer, MD  Insulin Syringe-Needle U-100 (BD INSULIN SYRINGE ULTRAFINE) 31G X 15/64" 0.5 ML MISC Use as directed 08/11/15   Tresa Garter, MD  INVOKANA 300 MG TABS tablet Take 1 tablet (300 mg total) by mouth daily. 01/17/16   Maren Reamer, MD  metFORMIN (GLUCOPHAGE XR) 500 MG 24 hr tablet Take 2 tablets (1,000 mg total) by mouth 2 (two) times daily. 01/17/16   Maren Reamer, MD  metoCLOPramide (REGLAN) 10 MG tablet Take 1 tablet (10 mg total) by mouth every 6 (six) hours as needed for nausea. Patient not taking: Reported on 02/02/2016 08/30/15   Maren Reamer, MD  naproxen (NAPROSYN) 500 MG tablet Take 1 tablet (500 mg total) by mouth 2 (two) times daily. 01/13/16   Hope Bunnie Pion, NP  NEEDLE, DISP, 24 G (B-D DISP NEEDLE 24GX1") 24G X 1" MISC Use with solostar pen to inject 4 times daily 07/09/14   Tresa Garter, MD  nystatin (MYCOSTATIN/NYSTOP) powder Apply topically 4 (four) times daily. 01/17/16   Maren Reamer, MD  pantoprazole (PROTONIX) 40 MG tablet Take 1 tablet (40 mg total) by mouth daily. 08/29/15   Recardo Evangelist, PA-C  ranitidine (ZANTAC) 150 MG tablet Take 1 tablet (150 mg total) by  mouth 2 (two) times daily. Patient not taking: Reported on 02/02/2016 07/06/15   Maren Reamer, MD  sucralfate (CARAFATE) 1 GM/10ML suspension Take 10 mLs (1 g total) by mouth 4 (four) times daily -  with meals and at bedtime. Patient not taking: Reported on 02/02/2016 08/30/15   Maren Reamer, MD  Travoprost, BAK Free, (TRAVATAN) 0.004 % SOLN ophthalmic solution Place 1 drop into both eyes daily.     Historical Provider, MD  zolpidem (AMBIEN) 5 MG tablet Take 5 mg by mouth at bedtime.    Historical Provider, MD    Family History Family History  Problem Relation Age of Onset  .  Diabetes Mother   . Stroke Mother   . Hypertension Mother   . Cancer Sister     Social History Social History  Substance Use Topics  . Smoking status: Current Every Day Smoker    Packs/day: 0.25    Types: Cigarettes  . Smokeless tobacco: Never Used  . Alcohol use No     Allergies   Patient has no known allergies.   Review of Systems Review of Systems  Constitutional: Negative for fever.  HENT: Negative for rhinorrhea and sore throat.   Respiratory: Positive for cough and shortness of breath.   Cardiovascular: Negative for orthopnea, leg swelling and PND.  Gastrointestinal: Negative for abdominal pain and vomiting.     Physical Exam Updated Vital Signs BP 107/96   Pulse 86   Temp 97.8 F (36.6 C) (Oral)   Resp 17   Ht 5\' 6"  (1.676 m)   Wt 134.3 kg   SpO2 100%   BMI 47.78 kg/m   Physical Exam  Constitutional: She is oriented to person, place, and time. She appears well-developed and well-nourished. No distress.  HENT:  Head: Normocephalic and atraumatic.  Eyes: Conjunctivae are normal. No scleral icterus.  Neck: Normal range of motion.  Cardiovascular: Normal rate, regular rhythm and normal heart sounds.  Exam reveals no gallop and no friction rub.   No murmur heard. Pulmonary/Chest: No respiratory distress. She has wheezes.  Speaking in full sentences, sentences sometimes  interrupted by a dry tight wheezy cough. Nonproductive. No inspiratory wheezes. Wheezes are only present with coughing.  Abdominal: Soft. Bowel sounds are normal. She exhibits no distension and no mass. There is no tenderness. There is no guarding.  Neurological: She is alert and oriented to person, place, and time.  Skin: Skin is warm and dry. She is not diaphoretic.  Psychiatric: Her behavior is normal.  Nursing note and vitals reviewed.    ED Treatments / Results  Labs (all labs ordered are listed, but only abnormal results are displayed) Labs Reviewed  CBC - Abnormal; Notable for the following:       Result Value   WBC 12.9 (*)    Hemoglobin 11.9 (*)    Platelets 413 (*)    All other components within normal limits  BASIC METABOLIC PANEL  I-STAT TROPOININ, ED    EKG  EKG Interpretation  Date/Time:  Sunday March 12 2016 23:02:46 EST Ventricular Rate:  75 PR Interval:    QRS Duration: 87 QT Interval:  404 QTC Calculation: 452 R Axis:   38 Text Interpretation:  Sinus rhythm Abnormal R-wave progression, early transition When compared with ECG of 08/29/2015, No significant change was found Confirmed by [facility]  MD, DAVID ([id number]) on 03/12/2016 11:10:22 PM       Radiology No results found.  Procedures Procedures (including critical care time)  Medications Ordered in ED Medications - No data to display   Initial Impression / Assessment and Plan / ED Course  I have reviewed the triage vital signs and the nursing notes.  Pertinent labs & imaging results that were available during my care of the patient were reviewed by me and considered in my medical decision making (see chart for details).  Clinical Course as of Mar 14 219  Mon Mar 13, 2016  0219 WBC: (!) 12.9 [AH]    Clinical Course User Index [AH] Margarita Mail, PA-C    The patient's chest x-ray is also with bronchitis. She has a white blood cell count and is afebrile. Oxygen saturations  are normal. Her blood  glucose is elevated. The patient will be discharged with albuterol, and cough suppressant. She is follow up with her primary care physician. We'll treat like a COPD exacerbation with azithromycin. She is well appearing otherwise appears appropriate for discharge at this time. Discussed return precautions. Final Clinical Impressions(s) / ED Diagnoses   Final diagnoses:  Bronchitis    New Prescriptions New Prescriptions   No medications on file     Margarita Mail, PA-C [id number]    Delora Fuel, MD [id number]

## 2016-03-13 ENCOUNTER — Ambulatory Visit (INDEPENDENT_AMBULATORY_CARE_PROVIDER_SITE_OTHER): Payer: Medicaid Other | Admitting: Internal Medicine

## 2016-03-13 ENCOUNTER — Encounter: Payer: Self-pay | Admitting: Internal Medicine

## 2016-03-13 VITALS — BP 128/80 | HR 88 | Ht 66.5 in | Wt 309.1 lb

## 2016-03-13 DIAGNOSIS — K3184 Gastroparesis: Secondary | ICD-10-CM | POA: Diagnosis not present

## 2016-03-13 DIAGNOSIS — K760 Fatty (change of) liver, not elsewhere classified: Secondary | ICD-10-CM | POA: Diagnosis not present

## 2016-03-13 DIAGNOSIS — K219 Gastro-esophageal reflux disease without esophagitis: Secondary | ICD-10-CM | POA: Diagnosis not present

## 2016-03-13 MED ORDER — AZITHROMYCIN 250 MG PO TABS: ORAL_TABLET | ORAL | 0 refills | Status: DC

## 2016-03-13 MED ORDER — ALBUTEROL SULFATE HFA 108 (90 BASE) MCG/ACT IN AERS: 1.0000 | INHALATION_SPRAY | Freq: Four times a day (QID) | RESPIRATORY_TRACT | 0 refills | Status: DC | PRN

## 2016-03-13 MED ORDER — PANTOPRAZOLE SODIUM 40 MG PO TBEC: 40.0000 mg | DELAYED_RELEASE_TABLET | Freq: Two times a day (BID) | ORAL | 3 refills | Status: DC

## 2016-03-13 MED ORDER — GUAIFENESIN-CODEINE 100-10 MG/5ML PO SOLN: 5.0000 mL | Freq: Four times a day (QID) | ORAL | 0 refills | Status: DC | PRN

## 2016-03-13 MED ORDER — METOCLOPRAMIDE HCL 5 MG PO TABS: 5.0000 mg | ORAL_TABLET | Freq: Three times a day (TID) | ORAL | 0 refills | Status: DC

## 2016-03-13 NOTE — Patient Instructions (Addendum)
We have sent the following medications to your pharmacy for you to pick up at your convenience: Protonix 40 mg twice daily before meals Reglan 5 mg three times daily before meals (for 2-4 weeks)-if better, change reglan to as needed.  Please follow up with Dr Hilarie Fredrickson in 8-12 weeks. We will contact you when it is time to schedule.  Thank you.

## 2016-03-13 NOTE — Progress Notes (Signed)
Subjective:    Patient ID: Tara Keller, female    DOB: 1965/05/27, 51 y.o.   MRN: [id number]  HPI Tara Keller is a 51 year old female with a past medical history of GERD with esophagitis, probable gastroparesis related to diabetes, fatty liver, history of H. pylori gastritis status post treatment, adenomatous colon polyps, morbid obesity, sleep apnea who is here for follow-up. She was last seen nearly 2 years ago. At the time of her last visit she was maintained on pantoprazole daily for GERD and also metoclopramide for gastroparesis. She has been somewhat lost to follow-up and has run out of both pantoprazole and metoclopramide.  She reports that she has had recurrent heartburn and burning epigastric pain. This is worse with eating. She also feels upper abdominal tightness and bloating. Some mild nausea but no vomiting. She denies dysphagia or odynophagia. Bowel habits recently have been regular and she denies blood in her stool or melena. She has had recent issues with cough and been diagnosed with bronchitis. She's currently on antibiotics for bronchitis. She also is trying to stop smoking.  She reports high stress levels. Her daughter died last year as did her sister.  Review of Systems As per history of present illness, otherwise negative  Current Medications, Allergies, Past Medical History, Past Surgical History, Family History and Social History were reviewed in [employer] record.     Objective:   Physical Exam BP 128/80   Pulse 88   Ht 5' 6.5" (1.689 m)   Wt (!) 309 lb 2 oz (140.2 kg)   BMI 49.15 kg/m  Constitutional: Well-developed and well-nourished. No distress. HEENT: Normocephalic and atraumatic.  Conjunctivae are normal.  No scleral icterus. Neck: Neck supple. Trachea midline. Cardiovascular: Normal rate, regular rhythm and intact distal pulses. No M/R/G Pulmonary/chest: Effort normal and breath sounds normal. No wheezing, rales or  rhonchi. Abdominal: Soft, Obese, upper abdominal tenderness without rebound or guarding. Bowel sounds active throughout.  Extremities: no clubbing, cyanosis, or edema Neurological: Alert and oriented to person place and time. Skin: Skin is warm and dry.  Psychiatric: Normal mood and affect. Behavior is normal.  CBC    Component Value Date/Time   WBC 12.9 (H) 03/12/2016 2316   RBC 4.33 03/12/2016 2316   HGB 11.9 (L) 03/12/2016 2316   HCT 36.9 03/12/2016 2316   PLT 413 (H) 03/12/2016 2316   MCV 85.2 03/12/2016 2316   MCH 27.5 03/12/2016 2316   MCHC 32.2 03/12/2016 2316   RDW 14.0 03/12/2016 2316   LYMPHSABS 3.5 02/16/2014 1538   MONOABS 0.4 02/16/2014 1538   EOSABS 0.3 02/16/2014 1538   BASOSABS 0.1 02/16/2014 1538   CMP     Component Value Date/Time   NA 138 03/12/2016 2316   K 3.2 (L) 03/12/2016 2316   CL 99 (L) 03/12/2016 2316   CO2 27 03/12/2016 2316   GLUCOSE 229 (H) 03/12/2016 2316   BUN 5 (L) 03/12/2016 2316   CREATININE 0.63 03/12/2016 2316   CREATININE 0.61 01/17/2016 1407   CALCIUM 9.4 03/12/2016 2316   PROT 7.6 08/29/2015 0946   ALBUMIN 3.4 (L) 08/29/2015 0946   AST 30 08/29/2015 0946   ALT 35 08/29/2015 0946   ALKPHOS 166 (H) 08/29/2015 0946   BILITOT 0.4 08/29/2015 0946   GFRNONAA >60 03/12/2016 2316   GFRNONAA >89 01/17/2016 1407   GFRAA >60 03/12/2016 2316   GFRAA >89 01/17/2016 1407       Assessment & Plan:  51 year old female with a  past medical history of GERD with esophagitis, probable gastroparesis related to diabetes, fatty liver, history of H. pylori gastritis status post treatment, adenomatous colon polyps, morbid obesity, sleep apnea who is here for follow-up.   1. GERD/epigastric pain -- history of reflux esophagitis and hiatal hernia. I recommended that we resume pantoprazole 40 mg twice a day before meals 2-4 weeks. Assuming symptoms improve and come under control of acid she reduced to 40 mg once daily before breakfast. She voices  understanding. If persistent symptoms I would recommend repeating upper endoscopy. History of H. pylori status post treatment.  2. Gastroparesis in the setting of diabetes -- we reviewed gastroparesis diet. Stressed the importance of tight glucose control. I'm going to resume metoclopramide 5 mg 3 times a day before meals. As symptoms improve I would like her to use this as needed. We reviewed the possible neurologic consultations of this medication and after this discussion she wishes to proceed with this therapy. I reminded her to use this medicine as infrequently as possible. She voiced understanding  3. Fatty liver -- seen by ultrasound and she certainly has the risk factors for nonalcoholic fatty liver disease. We will continue to monitor liver enzymes routinely. No current evidence for advanced fibrosis or cirrhosis.  4. Smoking -- I encouraged her to stop smoking. She already has set the skull and hopefully can achieve.  8 to12 week follow-up, sooner if necessary  25 minutes spent with the patient today. Greater than 50% was spent in counseling and coordination of care with the patient

## 2016-03-13 NOTE — Discharge Instructions (Signed)
You appear to have an upper respiratory infection (URI). An upper respiratory tract infection, or cold, is a viral infection of the air passages leading to the lungs. It is contagious and can be spread to others, especially during the first 3 or 4 days. It cannot be cured by antibiotics or other medicines. °RETURN IMMEDIATELY IF you develop shortness of breath, confusion or altered mental status, a new rash, become dizzy, faint, or poorly responsive, or are unable to be cared for at home. ° °

## 2016-03-13 NOTE — ED Notes (Signed)
Pt requesting something to drink. OK per RN Aaron Edelman. Pt given sprite per requested.

## 2016-03-21 ENCOUNTER — Telehealth: Payer: Self-pay

## 2016-03-21 DIAGNOSIS — G4733 Obstructive sleep apnea (adult) (pediatric): Secondary | ICD-10-CM

## 2016-03-21 DIAGNOSIS — Z9989 Dependence on other enabling machines and devices: Principal | ICD-10-CM

## 2016-03-21 NOTE — Telephone Encounter (Signed)
Call placed to the patient to inquire if she has received her nasal CPAP mask. She said that she has received it but she thinks she needs a new sleep study done. She said that she lost a lot of weight ( was 400 lbs and is now 300 lbs) and there is too much pressure from the machine. Informed her that Dr Janne Napoleon would be notified of the request.

## 2016-03-21 NOTE — Telephone Encounter (Signed)
Has she been able to get her cpap? thanks

## 2016-03-21 NOTE — Telephone Encounter (Signed)
Thanks. I put in order for cpap titration. Sleep ctr will call her and schedule, I believe.

## 2016-03-21 NOTE — Telephone Encounter (Signed)
Yes, thank you.

## 2016-04-21 ENCOUNTER — Encounter: Payer: Self-pay | Admitting: Neurology

## 2016-04-21 ENCOUNTER — Ambulatory Visit (INDEPENDENT_AMBULATORY_CARE_PROVIDER_SITE_OTHER): Payer: Medicaid Other | Admitting: Neurology

## 2016-04-21 VITALS — BP 104/68 | HR 96 | Ht 66.5 in | Wt 302.0 lb

## 2016-04-21 DIAGNOSIS — M79601 Pain in right arm: Secondary | ICD-10-CM

## 2016-04-21 DIAGNOSIS — M779 Enthesopathy, unspecified: Secondary | ICD-10-CM | POA: Diagnosis not present

## 2016-04-21 NOTE — Progress Notes (Signed)
[facility] Clinic Note - Initial Visit   Date: 04/21/16  Tara Keller MRN: [id number] DOB: September 08, 1965   Dear Dr. Janne Napoleon:  Thank you for your kind referral of Tara Keller for consultation of right hand pain. Although her history is well known to you, please allow Tara Keller to reiterate it for the purpose of our medical record. The patient was accompanied to the clinic by self.    History of Present Illness: Tara Keller is a 51 y.o. right-handed African American female with insulin-dependent diabetes mellitus, depression, OSA, morbid obesity, tobacco use presenting for evaluation of right hand pain.    Starting around 2016, she began noticing difficulty with picking up objects and sharp pain starting over the dorsum of the 4th and 5th digits and radiates up her forearm.  She does not have numbness or tingling.  It is worse with wrist flexion and extension. Opening jars or bottle reproduces hand pain.  She has pain-limiting weakness, but denies dropping objects from the hand.  Immobilizing the wrist such as in a splint helps and completely alleviates her pain. She also endorses tenderness or palpation over the dorsum of the hand. There is no redness of swelling.  She has tried tylenol and Aleve, but it does not help. She denies any neck pain or similar symptoms on the left side.    Out-side paper records, electronic medical record, and images have been reviewed where available and summarized as:  Lab Results  Component Value Date   TSH 0.69 01/17/2016   Lab Results  Component Value Date   HGBA1C 9.4 01/17/2016    Past Medical History:  Diagnosis Date  . Anxiety   . Arthritis   . Asthma   . Depression   . Diabetes mellitus   . Fatty liver   . Fracture closed of upper end of forearm 9 years, Fx left left leg  . Fracture of left lower leg @ 51 years old  . H/O tubal ligation   . Hiatal hernia   . Hypertension   . Obesity   . Positive H. pylori test     . Sleep apnea    uses CPAP  . Tubular adenoma of colon     Past Surgical History:  Procedure Laterality Date  . CHOLECYSTECTOMY    . COLONOSCOPY WITH PROPOFOL N/A 08/15/2013   Procedure: COLONOSCOPY WITH PROPOFOL;  Surgeon: Jerene Bears, MD;  Location: [facility];  Service: Gastroenterology;  Laterality: N/A;  . ESOPHAGOGASTRODUODENOSCOPY N/A 05/12/2014   Procedure: ESOPHAGOGASTRODUODENOSCOPY (EGD);  Surgeon: Jerene Bears, MD;  Location: [facility];  Service: Gastroenterology;  Laterality: N/A;  . ESOPHAGOGASTRODUODENOSCOPY (EGD) WITH PROPOFOL N/A 08/15/2013   Procedure: ESOPHAGOGASTRODUODENOSCOPY (EGD) WITH PROPOFOL;  Surgeon: Jerene Bears, MD;  Location: [facility];  Service: Gastroenterology;  Laterality: N/A;  . TUBAL LIGATION Bilateral      Medications:  Outpatient Encounter Prescriptions as of 04/21/2016  Medication Sig  . ACCU-CHEK AVIVA PLUS test strip TEST three times a day  . ACCU-CHEK FASTCLIX LANCETS MISC Use as directed  . albuterol (PROVENTIL HFA;VENTOLIN HFA) 108 (90 Base) MCG/ACT inhaler Inhale 1-2 puffs into the lungs every 6 (six) hours as needed for wheezing or shortness of breath.  [name] buPROPion (WELLBUTRIN XL) 150 MG 24 hr tablet Take 450 mg by mouth daily.  . clonazePAM (KLONOPIN) 1 MG tablet Take 1 mg by mouth at bedtime.  . DULoxetine (CYMBALTA) 60 MG capsule Take 60 mg by mouth 2 (two) times daily.   [name]  gabapentin (NEURONTIN) 100 MG capsule Take 100 mg by mouth every morning.  . hydrOXYzine (VISTARIL) 25 MG capsule Take 4 capsules (100 mg total) by mouth 3 (three) times daily as needed for itching. (Patient taking differently: Take 25 mg by mouth 3 (three) times daily as needed for itching. )  . insulin aspart (NOVOLOG) 100 UNIT/ML FlexPen Inject 12 Units into the skin 3 (three) times daily with meals.  . Insulin Glargine (LANTUS SOLOSTAR) 100 UNIT/ML Solostar Pen Inject 40 Units into the skin daily at 10 pm.  . Insulin Pen Needle 31G X 8 MM MISC Use as directed  for 4 times daily insulin administration.  . Insulin Syringe-Needle U-100 (BD INSULIN SYRINGE ULTRAFINE) 31G X 15/64" 0.5 ML MISC Use as directed  . INVOKANA 300 MG TABS tablet Take 1 tablet (300 mg total) by mouth daily.  . metFORMIN (GLUCOPHAGE XR) 500 MG 24 hr tablet Take 2 tablets (1,000 mg total) by mouth 2 (two) times daily.  . metoCLOPramide (REGLAN) 5 MG tablet Take 1 tablet (5 mg total) by mouth 3 (three) times daily before meals.  [name] NEEDLE, DISP, 24 G (B-D DISP NEEDLE 24GX1") 24G X 1" MISC Use with solostar pen to inject 4 times daily  . pantoprazole (PROTONIX) 40 MG tablet Take 1 tablet (40 mg total) by mouth 2 (two) times daily before a meal.  . zolpidem (AMBIEN) 5 MG tablet Take 5 mg by mouth at bedtime.  . [DISCONTINUED] azithromycin (ZITHROMAX Z-PAK) 250 MG tablet 2 po day one, then 1 daily x 4 days  . [DISCONTINUED] guaiFENesin-codeine 100-10 MG/5ML syrup Take 5-10 mLs by mouth every 6 (six) hours as needed for cough.   No facility-administered encounter medications on file as of 04/21/2016.      Allergies: No Known Allergies  Family History: Family History  Problem Relation Age of Onset  . Diabetes Mother   . Stroke Mother   . Hypertension Mother   . Breast cancer Sister   . Heart attack Sister   . Other Daughter     died at age 59    Social History: Social History  Substance Use Topics  . Smoking status: Current Every Day Smoker    Packs/day: 0.25    Types: Cigarettes  . Smokeless tobacco: Never Used  . Alcohol use No   Social History   Social History Narrative   She lives with fiance.  Her daughter died in 27-Apr-2015 at the age of 88.   She is on disability since 04-27-1990   Highest level of education:  Working on [employer]    Review of Systems:  CONSTITUTIONAL: No fevers, chills, night sweats, or weight loss.   EYES: No visual changes or eye pain ENT: No hearing changes.  No history of nose bleeds.   RESPIRATORY: No cough, wheezing and shortness of breath.     CARDIOVASCULAR: Negative for chest pain, and palpitations.   GI: Negative for abdominal discomfort, blood in stools or black stools.  No recent change in bowel habits.   GU:  No history of incontinence.   MUSCLOSKELETAL: +history of joint pain or swelling.  No myalgias.   SKIN: Negative for lesions, rash, and itching.   HEMATOLOGY/ONCOLOGY: Negative for prolonged bleeding, bruising easily, and swollen nodes.  No history of cancer.   ENDOCRINE: Negative for cold or heat intolerance, polydipsia or goiter.   PSYCH:  + depression or anxiety symptoms.   NEURO: As Above.   Vital Signs:  BP 104/68   Pulse 96  Ht 5' 6.5" (1.689 m)   Wt (!) 302 lb (137 kg)   SpO2 97%   BMI 48.01 kg/m   General Medical Exam:   General:  Well appearing, comfortable.   Eyes/ENT: see cranial nerve examination.   Neck: No masses appreciated.  Full range of motion without tenderness.  No carotid bruits. Respiratory:  Clear to auscultation, good air entry bilaterally.   Cardiac:  Regular rate and rhythm, no murmur.   Extremities:  There is pain with wrist flexion and extension.  Point tenderness over the dorsum of the hand proximal to the MCP, worse over 4th and 5th digit.   Skin:  No rashes or lesions.  Neurological Exam: MENTAL STATUS including orientation to time, place, person, recent and remote memory, attention span and concentration, language, and fund of knowledge is normal.  Speech is not dysarthric.  CRANIAL NERVES: II:  No visual field defects.  Unremarkable fundi.   III-IV-VI: Pupils equal round and reactive to light.  Normal conjugate, extra-ocular eye movements in all directions of gaze.  No nystagmus.  No ptosis.   V:  Normal facial sensation.    VII:  Normal facial symmetry and movements.   VIII:  Normal hearing and vestibular function.   IX-X:  Normal palatal movement.   XI:  Normal shoulder shrug and head rotation.   XII:  Normal tongue strength and range of motion, no deviation or  fasciculation.  MOTOR:  No atrophy, fasciculations or abnormal movements.  No pronator drift.  Tone is normal.    Right Upper Extremity:    Left Upper Extremity:    Deltoid  5/5   Deltoid  5/5   Biceps  5/5   Biceps  5/5   Triceps  5/5   Triceps  5/5   Wrist extensors  5/5   Wrist extensors  5/5   Wrist flexors  5/5   Wrist flexors  5/5   Finger extensors  5/5   Finger extensors  5/5   Finger flexors  5/5   Finger flexors  5/5   Dorsal interossei  5/5   Dorsal interossei  5/5   Abductor pollicis  5/5   Abductor pollicis  5/5   Tone (Ashworth scale)  0  Tone (Ashworth scale)  0   Right Lower Extremity:    Left Lower Extremity:    Hip flexors  5/5   Hip flexors  5/5   Hip extensors  5/5   Hip extensors  5/5   Knee flexors  5/5   Knee flexors  5/5   Knee extensors  5/5   Knee extensors  5/5   Dorsiflexors  5/5   Dorsiflexors  5/5   Plantarflexors  5/5   Plantarflexors  5/5   Toe extensors  5/5   Toe extensors  5/5   Toe flexors  5/5   Toe flexors  5/5   Tone (Ashworth scale)  0  Tone (Ashworth scale)  0   MSRs:  Right                                                                 Left brachioradialis 2+  brachioradialis 2+  biceps 2+  biceps 2+  triceps 2+  triceps 2+  patellar 2+  patellar 2+  ankle jerk 2+  ankle jerk 2+  Hoffman no  Hoffman no  plantar response down  plantar response down   SENSORY:  Normal and symmetric perception of light touch, pinprick, vibration, and proprioception.  COORDINATION/GAIT: Normal finger-to- nose-finger.  Intact rapid alternating movements bilaterally.  Gait narrow based and stable.    IMPRESSION: Ms. Waldrop is a 51 year-old female referred for evaluation of right hand pain.  I cannot appreciate any neurological deficits to explain her hand symptoms, which is more suggestive of musculoskeletal pain, such as tendonitis.  She does not wish to pursue NCS/EMG and given that my overall suspicion for primary neurological etiology is low, this is  reasonable.  I will refer her to Sports Medicine for evaluation and management.  In the meantime, she may take ibuprofen 400-600mg  BID for pain, rest, and continue immobilization with wrist splint.  Encouraged her to stop smoking and to keep sugars under control.  No signs of diabetic neuropathy on exam at this time.   The duration of this appointment visit was 30 minutes of face-to-face time with the patient.  Greater than 50% of this time was spent in counseling, explanation of diagnosis, planning of further management, and coordination of care.   Thank you for allowing me to participate in patient's care.  If I can answer any additional questions, I would be pleased to do so.    Sincerely,    Sacha Topor K. Posey Pronto, DO

## 2016-04-21 NOTE — Patient Instructions (Signed)
1.  Referral to Sports Medicine  2.  Try using ibuprofen 400-600mg  twice daily and wrist split for pain

## 2016-04-25 ENCOUNTER — Encounter: Payer: Self-pay | Admitting: Internal Medicine

## 2016-04-25 ENCOUNTER — Other Ambulatory Visit (HOSPITAL_COMMUNITY)
Admission: RE | Admit: 2016-04-25 | Discharge: 2016-04-25 | Disposition: A | Discharge status: Home / Self Care | Payer: Medicaid Other | Source: Ambulatory Visit | Attending: Internal Medicine | Admitting: Internal Medicine

## 2016-04-25 ENCOUNTER — Ambulatory Visit: Payer: Medicaid Other | Attending: Internal Medicine | Admitting: Internal Medicine

## 2016-04-25 VITALS — BP 139/85 | HR 87 | Temp 97.9°F | Resp 16 | Wt 304.2 lb

## 2016-04-25 DIAGNOSIS — G4733 Obstructive sleep apnea (adult) (pediatric): Secondary | ICD-10-CM | POA: Diagnosis not present

## 2016-04-25 DIAGNOSIS — Z79899 Other long term (current) drug therapy: Secondary | ICD-10-CM | POA: Diagnosis not present

## 2016-04-25 DIAGNOSIS — Z794 Long term (current) use of insulin: Secondary | ICD-10-CM | POA: Diagnosis not present

## 2016-04-25 DIAGNOSIS — F17200 Nicotine dependence, unspecified, uncomplicated: Secondary | ICD-10-CM

## 2016-04-25 DIAGNOSIS — L298 Other pruritus: Secondary | ICD-10-CM | POA: Diagnosis not present

## 2016-04-25 DIAGNOSIS — IMO0001 Reserved for inherently not codable concepts without codable children: Secondary | ICD-10-CM

## 2016-04-25 DIAGNOSIS — N898 Other specified noninflammatory disorders of vagina: Secondary | ICD-10-CM

## 2016-04-25 DIAGNOSIS — K219 Gastro-esophageal reflux disease without esophagitis: Secondary | ICD-10-CM

## 2016-04-25 DIAGNOSIS — Z720 Tobacco use: Secondary | ICD-10-CM | POA: Diagnosis not present

## 2016-04-25 DIAGNOSIS — E119 Type 2 diabetes mellitus without complications: Secondary | ICD-10-CM | POA: Diagnosis present

## 2016-04-25 DIAGNOSIS — Z124 Encounter for screening for malignant neoplasm of cervix: Secondary | ICD-10-CM

## 2016-04-25 DIAGNOSIS — Z7982 Long term (current) use of aspirin: Secondary | ICD-10-CM | POA: Insufficient documentation

## 2016-04-25 DIAGNOSIS — F329 Major depressive disorder, single episode, unspecified: Secondary | ICD-10-CM | POA: Diagnosis not present

## 2016-04-25 DIAGNOSIS — F32A Depression, unspecified: Secondary | ICD-10-CM

## 2016-04-25 LAB — POCT URINALYSIS DIPSTICK
Bilirubin, UA: NEGATIVE
Glucose, UA: 500
KETONES UA: NEGATIVE
Leukocytes, UA: NEGATIVE
Nitrite, UA: NEGATIVE
PH UA: 7 (ref 5.0–8.0)
PROTEIN UA: NEGATIVE
Spec Grav, UA: 1.01 (ref 1.010–1.025)
Urobilinogen, UA: 0.2 E.U./dL

## 2016-04-25 LAB — GLUCOSE, POCT (MANUAL RESULT ENTRY)
POC Glucose: 389 mg/dl — AB (ref 70–99)
POC Glucose: 395 mg/dl — AB (ref 70–99)
POC Glucose: 344 mg/dl — AB (ref 70–99)

## 2016-04-25 LAB — POCT GLYCOSYLATED HEMOGLOBIN (HGB A1C): HEMOGLOBIN A1C: 10.3

## 2016-04-25 MED ORDER — INSULIN ASPART 100 UNIT/ML ~~LOC~~ SOLN: 20.0000 [IU] | Freq: Once | SUBCUTANEOUS | Status: DC

## 2016-04-25 MED ORDER — INSULIN GLARGINE 100 UNIT/ML SOLOSTAR PEN: 45.0000 [IU] | PEN_INJECTOR | Freq: Every day | SUBCUTANEOUS | 2 refills | Status: DC

## 2016-04-25 MED ORDER — LISINOPRIL 5 MG PO TABS: 5.0000 mg | ORAL_TABLET | Freq: Every day | ORAL | 3 refills | Status: DC

## 2016-04-25 MED ORDER — METFORMIN HCL ER 500 MG PO TB24: 1000.0000 mg | ORAL_TABLET | Freq: Two times a day (BID) | ORAL | 2 refills | Status: DC

## 2016-04-25 MED ORDER — PRAVASTATIN SODIUM 20 MG PO TABS: 20.0000 mg | ORAL_TABLET | Freq: Every day | ORAL | 3 refills | Status: DC

## 2016-04-25 MED ORDER — INSULIN ASPART 100 UNIT/ML ~~LOC~~ SOLN
20.0000 [IU] | Freq: Once | SUBCUTANEOUS | Status: AC
  Administered 2016-04-25: 20 [IU] via SUBCUTANEOUS

## 2016-04-25 MED ORDER — INSULIN ASPART 100 UNIT/ML FLEXPEN: 15.0000 [IU] | PEN_INJECTOR | Freq: Three times a day (TID) | SUBCUTANEOUS | 11 refills | Status: DC

## 2016-04-25 MED ORDER — NICOTINE 21 MG/24HR TD PT24: 21.0000 mg | MEDICATED_PATCH | Freq: Every day | TRANSDERMAL | 0 refills | Status: DC

## 2016-04-25 MED ORDER — GABAPENTIN 100 MG PO CAPS: 100.0000 mg | ORAL_CAPSULE | ORAL | 3 refills | Status: DC

## 2016-04-25 MED ORDER — ASPIRIN EC 81 MG PO TBEC: 81.0000 mg | DELAYED_RELEASE_TABLET | Freq: Every day | ORAL | 3 refills | Status: DC

## 2016-04-25 NOTE — Patient Instructions (Addendum)
- [facility] 2 wks /dm chk.   Check blood sugars on waking up daily., and at least 1-2 times rest of day.    Also check blood sugars about 2 hours after a meal and do this after different meals by rotation  Recommended blood sugar levels on waking up is 90-130 and about 2 hours after meal is 130-160  Please bring your blood sugar monitor to each visit, thank you  Restart exercise  Continue cholesterol med.   - Lantus Titration  Instructions:  IF AM blood sugar is  >150, increase lantus by 1 unit at night.  Continue to check sugars daily. On day 3, if blood sugar >150, continue to increase lantus by 1 unit.  If blood sugar < 70s, than need to reduce dose by 1 unit nightly.  Continue to check blood sugars in am daily and adjust per above.  Friday IS NEXT TIME will  CHANGE lantus.  -- please call us if you have any questions  - For your tobacco abuse: Strongly recommend that he stop smoking back and all other inhaled products right away Call 1-800-Quit-Now to get free nicotine replacement from the state of [place]  -  Steps to Quit Smoking Smoking tobacco can be bad for your health. It can also affect almost every organ in your body. Smoking puts you and people around you at risk for many serious long-lasting (chronic) diseases. Quitting smoking is hard, but it is one of the best things that you can do for your health. It is never too late to quit. What are the benefits of quitting smoking? When you quit smoking, you lower your risk for getting serious diseases and conditions. They can include:  Lung cancer or lung disease.  Heart disease.  Stroke.  Heart attack.  Not being able to have children (infertility).  Weak bones (osteoporosis) and broken bones (fractures). If you have coughing, wheezing, and shortness of breath, those symptoms may get better when you quit. You may also get sick less often. If you are pregnant, quitting smoking can help to lower your  chances of having a baby of low birth weight. What can I do to help me quit smoking? Talk with your doctor about what can help you quit smoking. Some things you can do (strategies) include:  Quitting smoking totally, instead of slowly cutting back how much you smoke over a period of time.  Going to in-person counseling. You are more likely to quit if you go to many counseling sessions.  Using resources and support systems, such as:  Online chats with a Social worker.  Phone quitlines.  Printed Furniture conservator/restorer.  Support groups or group counseling.  Text messaging programs.  Mobile phone apps or applications.  Taking medicines. Some of these medicines may have nicotine in them. If you are pregnant or breastfeeding, do not take any medicines to quit smoking unless your doctor says it is okay. Talk with your doctor about counseling or other things that can help you. Talk with your doctor about using more than one strategy at the same time, such as taking medicines while you are also going to in-person counseling. This can help make quitting easier. What things can I do to make it easier to quit? Quitting smoking might feel very hard at first, but there is a lot that you can do to make it easier. Take these steps:  Talk to your family and friends. Ask them to support and encourage you.  Call phone  quitlines, reach out to support groups, or work with a Social worker.  Ask people who smoke to not smoke around you.  Avoid places that make you want (trigger) to smoke, such as:  Bars.  Parties.  Smoke-break areas at work.  Spend time with people who do not smoke.  Lower the stress in your life. Stress can make you want to smoke. Try these things to help your stress:  Getting regular exercise.  Deep-breathing exercises.  Yoga.  Meditating.  Doing a body scan. To do this, close your eyes, focus on one area of your body at a time from head to toe, and notice which parts of your  body are tense. Try to relax the muscles in those areas.  Download or buy apps on your mobile phone or tablet that can help you stick to your quit plan. There are many free apps, such as QuitGuide from the [employer] Office manager for Disease Control and Prevention). You can find more support from smokefree.gov and other websites. This information is not intended to replace advice given to you by your health care provider. Make sure you discuss any questions you have with your health care provider. Document Released: 10/22/2008 Document Revised: 08/24/2015 Document Reviewed: 05/12/2014 Elsevier Interactive Patient Education  2017 [place].    Diabetes Mellitus and Food It is important for you to manage your blood sugar (glucose) level. Your blood glucose level can be greatly affected by what you eat. Eating healthier foods in the appropriate amounts throughout the day at about the same time each day will help you control your blood glucose level. It can also help slow or prevent worsening of your diabetes mellitus. Healthy eating may even help you improve the level of your blood pressure and reach or maintain a healthy weight. General recommendations for healthful eating and cooking habits include:  Eating meals and snacks regularly. Avoid going long periods of time without eating to lose weight.  Eating a diet that consists mainly of plant-based foods, such as fruits, vegetables, nuts, legumes, and whole grains.  Using low-heat cooking methods, such as baking, instead of high-heat cooking methods, such as deep frying. Work with your dietitian to make sure you understand how to use the Nutrition Facts information on food labels. How can food affect me? Carbohydrates  Carbohydrates affect your blood glucose level more than any other type of food. Your dietitian will help you determine how many carbohydrates to eat at each meal and teach you how to count carbohydrates. Counting carbohydrates is important  to keep your blood glucose at a healthy level, especially if you are using insulin or taking certain medicines for diabetes mellitus. Alcohol  Alcohol can cause sudden decreases in blood glucose (hypoglycemia), especially if you use insulin or take certain medicines for diabetes mellitus. Hypoglycemia can be a life-threatening condition. Symptoms of hypoglycemia (sleepiness, dizziness, and disorientation) are similar to symptoms of having too much alcohol. If your health care provider has given you approval to drink alcohol, do so in moderation and use the following guidelines:  Women should not have more than one drink per day, and men should not have more than two drinks per day. One drink is equal to:  12 oz of beer.  5 oz of wine.  1 oz of hard liquor.  Do not drink on an empty stomach.  Keep yourself hydrated. Have water, diet soda, or unsweetened iced tea.  Regular soda, juice, and other mixers might contain a lot of carbohydrates and should  be counted. What foods are not recommended? As you make food choices, it is important to remember that all foods are not the same. Some foods have fewer nutrients per serving than other foods, even though they might have the same number of calories or carbohydrates. It is difficult to get your body what it needs when you eat foods with fewer nutrients. Examples of foods that you should avoid that are high in calories and carbohydrates but low in nutrients include:  Trans fats (most processed foods list trans fats on the Nutrition Facts label).  Regular soda.  Juice.  Candy.  Sweets, such as cake, pie, doughnuts, and cookies.  Fried foods. What foods can I eat? Eat nutrient-rich foods, which will nourish your body and keep you healthy. The food you should eat also will depend on several factors, including:  The calories you need.  The medicines you take.  Your weight.  Your blood glucose level.  Your blood pressure level.  Your  cholesterol level. You should eat a variety of foods, including:  Protein.  Lean cuts of meat.  Proteins low in saturated fats, such as fish, egg whites, and beans. Avoid processed meats.  Fruits and vegetables.  Fruits and vegetables that may help control blood glucose levels, such as apples, mangoes, and yams.  Dairy products.  Choose fat-free or low-fat dairy products, such as milk, yogurt, and cheese.  Grains, bread, pasta, and rice.  Choose whole grain products, such as multigrain bread, whole oats, and brown rice. These foods may help control blood pressure.  Fats.  Foods containing healthful fats, such as nuts, avocado, olive oil, canola oil, and fish. Does everyone with diabetes mellitus have the same meal plan? Because every person with diabetes mellitus is different, there is not one meal plan that works for everyone. It is very important that you meet with a dietitian who will help you create a meal plan that is just right for you. This information is not intended to replace advice given to you by your health care provider. Make sure you discuss any questions you have with your health care provider. Document Released: 09/22/2004 Document Revised: 06/03/2015 Document Reviewed: 11/22/2012 Elsevier Interactive Patient Education  2017 [place].  -  Type 2 Diabetes Mellitus, Self Care, Adult When you have type 2 diabetes (type 2 diabetes mellitus), you must keep your blood sugar (glucose) under control. You can do this with:  Nutrition.  Exercise.  Lifestyle changes.  Medicines or insulin, if needed.  Support from your doctors and others. How do I manage my blood sugar?  Check your blood sugar level every day, as often as told.  Call your doctor if your blood sugar is above your goal numbers for 2 tests in a row.  Have your A1c (hemoglobin A1c) level checked at least two times a year. Have it checked more often if your doctor tells you to. Your doctor  will set treatment goals for you. Generally, you should have these blood sugar levels:  Before meals (preprandial): 80-130 mg/dL (4.4-7.2 mmol/L).  After meals (postprandial): lower than 180 mg/dL (10 mmol/L).  A1c level: less than 7%. What do I need to know about high blood sugar? High blood sugar is called hyperglycemia. Know the signs of high blood sugar. Signs may include:  Feeling:  Thirsty.  Hungry.  Very tired.  Needing to pee (urinate) more than usual.  Blurry vision. What do I need to know about low blood sugar? Low blood sugar is  called hypoglycemia. This is when blood sugar is at or below 70 mg/dL (3.9 mmol/L). Symptoms may include:  Feeling:  Hungry.  Worried or nervous (anxious).  Sweaty and clammy.  Confused.  Dizzy.  Sleepy.  Sick to your stomach (nauseous).  Having:  A fast heartbeat (palpitations).  A headache.  A change in your vision.  Jerky movements that you cannot control (seizure).  Nightmares.  Tingling or no feeling (numbness) around the mouth, lips, or tongue.  Having trouble with:  Talking.  Paying attention (concentrating).  Moving (coordination).  Sleeping.  Shaking.  Passing out (fainting).  Getting upset easily (irritability). Treating low blood sugar   To treat low blood sugar, eat or drink something sugary right away. If you can think clearly and swallow safely, follow the 15:15 rule:  Take 15 grams of a fast-acting carb (carbohydrate). Some fast-acting carbs are:  1 tube of glucose gel.  3 sugar tablets (glucose pills).  6-8 pieces of hard candy.  4 oz (120 mL) of fruit juice.  4 oz (120 mL) regular (not diet) soda.  Check your blood sugar 15 minutes after you take the carb.  If your blood sugar is still at or below 70 mg/dL (3.9 mmol/L), take 15 grams of a carb again.  If your blood sugar does not go above 70 mg/dL (3.9 mmol/L) after 3 tries, get help right away.  After your blood sugar goes  back to normal, eat a meal or a snack within 1 hour. Treating very low blood sugar  If your blood sugar is at or below 54 mg/dL (3 mmol/L), you have very low blood sugar (severe hypoglycemia). This is an emergency. Do not wait to see if the symptoms will go away. Get medical help right away. Call your local emergency services (911 in the [place] Do not drive yourself to the hospital. If you have very low blood sugar and you cannot eat or drink, you may need a glucagon shot (injection). A family member or friend should learn how to check your blood sugar and how to give you a glucagon shot. Ask your doctor if you need to have a glucagon shot kit at home. What else is important to manage my diabetes? Medicine  Follow these instructions about insulin and diabetes medicines:  Take them as told by your doctor.  Adjust them as told by your doctor.  Do not run out of them. Having diabetes can raise your risk for other long-term conditions. These include heart or kidney disease. Your doctor may prescribe medicines to help prevent problems from diabetes. Food    Make healthy food choices. These include:  Chicken, fish, egg whites, and beans.  Oats, whole wheat, bulgur, brown rice, quinoa, and millet.  Fresh fruits and vegetables.  Low-fat dairy products.  Nuts, avocado, olive oil, and canola oil.  Make a food plan with a specialist (dietitian).  Follow instructions from your doctor about what you cannot eat or drink.  Drink enough fluid to keep your pee (urine) clear or pale yellow.  Eat healthy snacks between healthy meals.  Keep track of carbs that you eat. Read food labels. Learn food serving sizes.  Follow your sick day plan when you cannot eat or drink normally. Make this plan with your doctor so it is ready to use. Activity   Exercise at least 3 times a week.  Do not go more than 2 days without exercising.  Talk with your doctor before you start a new exercise. Your  doctor  may need to adjust your insulin, medicines, or food. Lifestyle    Do not use any tobacco products. These include cigarettes, chewing tobacco, and e-cigarettes.If you need help quitting, ask your doctor.  Ask your doctor how much alcohol is safe for you.  Learn to deal with stress. If you need help with this, ask your doctor. Body care   Stay up to date with your shots (immunizations).  Have your eyes and feet checked by a doctor as often as told.  Check your skin and feet every day. Check for cuts, bruises, redness, blisters, or sores.  Brush your teeth and gums two times a day.  Floss at least one time a day.  Go to the dentist least one time every 6 months.  Stay at a healthy weight. General instructions    Take over-the-counter and prescription medicines only as told by your doctor.  Share your diabetes care plan with:  Your work or school.  People you live with.  Check your pee (urine) for ketones:  When you are sick.  As told by your doctor.  Carry a card or wear jewelry that says that you have diabetes.  Ask your doctor:  Do I need to meet with a diabetes educator?  Where can I find a support group for people with diabetes?  Keep all follow-up visits as told by your doctor. This is important. Where to find more information: To learn more about diabetes, visit:  American Diabetes Association: www.diabetes.org  American Association of Diabetes Educators: www.diabeteseducator.org/patient-resources This information is not intended to replace advice given to you by your health care provider. Make sure you discuss any questions you have with your health care provider. Document Released: 04/19/2015 Document Revised: 06/03/2015 Document Reviewed: 01/29/2015 Elsevier Interactive Patient Education  2017 Elsevier Inc.  -   Diabetes Mellitus and Exercise Exercising regularly is important for your overall health, especially when you have diabetes (diabetes  mellitus). Exercising is not only about losing weight. It has many health benefits, such as increasing muscle strength and bone density and reducing body fat and stress. This leads to improved fitness, flexibility, and endurance, all of which result in better overall health. Exercise has additional benefits for people with diabetes, including:  Reducing appetite.  Helping to lower and control blood glucose.  Lowering blood pressure.  Helping to control amounts of fatty substances (lipids) in the blood, such as cholesterol and triglycerides.  Helping the body to respond better to insulin (improving insulin sensitivity).  Reducing how much insulin the body needs.  Decreasing the risk for heart disease by:  Lowering cholesterol and triglyceride levels.  Increasing the levels of good cholesterol.  Lowering blood glucose levels. What is my activity plan? Your health care provider or certified diabetes educator can help you make a plan for the type and frequency of exercise (activity plan) that works for you. Make sure that you:  Do at least 150 minutes of moderate-intensity or vigorous-intensity exercise each week. This could be brisk walking, biking, or water aerobics.  Do stretching and strength exercises, such as yoga or weightlifting, at least 2 times a week.  Spread out your activity over at least 3 days of the week.  Get some form of physical activity every day.  Do not go more than 2 days in a row without some kind of physical activity.  Avoid being inactive for more than 90 minutes at a time. Take frequent breaks to walk or stretch.  Choose a type  of exercise or activity that you enjoy, and set realistic goals.  Start slowly, and gradually increase the intensity of your exercise over time. What do I need to know about managing my diabetes?  Check your blood glucose before and after exercising.  If your blood glucose is higher than 240 mg/dL (13.3 mmol/L) before you  exercise, check your urine for ketones. If you have ketones in your urine, do not exercise until your blood glucose returns to normal.  Know the symptoms of low blood glucose (hypoglycemia) and how to treat it. Your risk for hypoglycemia increases during and after exercise. Common symptoms of hypoglycemia can include:  Hunger.  Anxiety.  Sweating and feeling clammy.  Confusion.  Dizziness or feeling light-headed.  Increased heart rate or palpitations.  Blurry vision.  Tingling or numbness around the mouth, lips, or tongue.  Tremors or shakes.  Irritability.  Keep a rapid-acting carbohydrate snack available before, during, and after exercise to help prevent or treat hypoglycemia.  Avoid injecting insulin into areas of the body that are going to be exercised. For example, avoid injecting insulin into:  The arms, when playing tennis.  The legs, when jogging.  Keep records of your exercise habits. Doing this can help you and your health care provider adjust your diabetes management plan as needed. Write down:  Food that you eat before and after you exercise.  Blood glucose levels before and after you exercise.  The type and amount of exercise you have done.  When your insulin is expected to peak, if you use insulin. Avoid exercising at times when your insulin is peaking.  When you start a new exercise or activity, work with your health care provider to make sure the activity is safe for you, and to adjust your insulin, medicines, or food intake as needed.  Drink plenty of water while you exercise to prevent dehydration or heat stroke. Drink enough fluid to keep your urine clear or pale yellow. This information is not intended to replace advice given to you by your health care provider. Make sure you discuss any questions you have with your health care provider. Document Released: 03/18/2003 Document Revised: 07/16/2015 Document Reviewed: 06/07/2015 Elsevier Interactive  Patient Education  2017 Elsevier Inc.  -  Low-Sodium Eating Plan Sodium, which is an element that makes up salt, helps you maintain a healthy balance of fluids in your body. Too much sodium can increase your blood pressure and cause fluid and waste to be held in your body. Your health care provider or dietitian may recommend following this plan if you have high blood pressure (hypertension), kidney disease, liver disease, or heart failure. Eating less sodium can help lower your blood pressure, reduce swelling, and protect your heart, liver, and kidneys. What are tips for following this plan? General guidelines   Most people on this plan should limit their sodium intake to 1,500-2,000 mg (milligrams) of sodium each day. Reading food labels   The Nutrition Facts label lists the amount of sodium in one serving of the food. If you eat more than one serving, you must multiply the listed amount of sodium by the number of servings.  Choose foods with less than 140 mg of sodium per serving.  Avoid foods with 300 mg of sodium or more per serving. Shopping   Look for lower-sodium products, often labeled as "low-sodium" or "no salt added."  Always check the sodium content even if foods are labeled as "unsalted" or "no salt added".  Buy fresh  foods.  Avoid canned foods and premade or frozen meals.  Avoid canned, cured, or processed meats  Buy breads that have less than 80 mg of sodium per slice. Cooking   Eat more home-cooked food and less restaurant, buffet, and fast food.  Avoid adding salt when cooking. Use salt-free seasonings or herbs instead of table salt or sea salt. Check with your health care provider or pharmacist before using salt substitutes.  Cook with plant-based oils, such as canola, sunflower, or olive oil. Meal planning   When eating at a restaurant, ask that your food be prepared with less salt or no salt, if possible.  Avoid foods that contain MSG (monosodium  glutamate). MSG is sometimes added to [place] food, bouillon, and some canned foods. What foods are recommended? The items listed may not be a complete list. Talk with your dietitian about what dietary choices are best for you. Grains  Low-sodium cereals, including oats, puffed wheat and rice, and shredded wheat. Low-sodium crackers. Unsalted rice. Unsalted pasta. Low-sodium bread. Whole-grain breads and whole-grain pasta. Vegetables  Fresh or frozen vegetables. "No salt added" canned vegetables. "No salt added" tomato sauce and paste. Low-sodium or reduced-sodium tomato and vegetable juice. Fruits  Fresh, frozen, or canned fruit. Fruit juice. Meats and other protein foods  Fresh or frozen (no salt added) meat, poultry, seafood, and fish. Low-sodium canned tuna and salmon. Unsalted nuts. Dried peas, beans, and lentils without added salt. Unsalted canned beans. Eggs. Unsalted nut butters. Dairy  Milk. Soy milk. Cheese that is naturally low in sodium, such as ricotta cheese, fresh mozzarella, or Swiss cheese Low-sodium or reduced-sodium cheese. Cream cheese. Yogurt. Fats and oils  Unsalted butter. Unsalted margarine with no trans fat. Vegetable oils such as canola or olive oils. Seasonings and other foods  Fresh and dried herbs and spices. Salt-free seasonings. Low-sodium mustard and ketchup. Sodium-free salad dressing. Sodium-free light mayonnaise. Fresh or refrigerated horseradish. Lemon juice. Vinegar. Homemade, reduced-sodium, or low-sodium soups. Unsalted popcorn and pretzels. Low-salt or salt-free chips. What foods are not recommended? The items listed may not be a complete list. Talk with your dietitian about what dietary choices are best for you. Grains  Instant hot cereals. Bread stuffing, pancake, and biscuit mixes. Croutons. Seasoned rice or pasta mixes. Noodle soup cups. Boxed or frozen macaroni and cheese. Regular salted crackers. Self-rising flour. Vegetables  Sauerkraut, pickled  vegetables, and relishes. Olives. [place] fries. Onion rings. Regular canned vegetables (not low-sodium or reduced-sodium). Regular canned tomato sauce and paste (not low-sodium or reduced-sodium). Regular tomato and vegetable juice (not low-sodium or reduced-sodium). Frozen vegetables in sauces. Meats and other protein foods  Meat or fish that is salted, canned, smoked, spiced, or pickled. Bacon, ham, sausage, hotdogs, corned beef, chipped beef, packaged lunch meats, salt pork, jerky, pickled herring, anchovies, regular canned tuna, sardines, salted nuts. Dairy  Processed cheese and cheese spreads. Cheese curds. Blue cheese. Feta cheese. String cheese. Regular cottage cheese. Buttermilk. Canned milk. Fats and oils  Salted butter. Regular margarine. Ghee. Bacon fat. Seasonings and other foods  Onion salt, garlic salt, seasoned salt, table salt, and sea salt. Canned and packaged gravies. Worcestershire sauce. Tartar sauce. Barbecue sauce. Teriyaki sauce. Soy sauce, including reduced-sodium. Steak sauce. Fish sauce. Oyster sauce. Cocktail sauce. Horseradish that you find on the shelf. Regular ketchup and mustard. Meat flavorings and tenderizers. Bouillon cubes. Hot sauce and Tabasco sauce. Premade or packaged marinades. Premade or packaged taco seasonings. Relishes. Regular salad dressings. Salsa. Potato and tortilla chips. Corn chips and  puffs. Salted popcorn and pretzels. Canned or dried soups. Pizza. Frozen entrees and pot pies. Summary  Eating less sodium can help lower your blood pressure, reduce swelling, and protect your heart, liver, and kidneys.  Most people on this plan should limit their sodium intake to 1,500-2,000 mg (milligrams) of sodium each day.  Canned, boxed, and frozen foods are high in sodium. Restaurant foods, fast foods, and pizza are also very high in sodium. You also get sodium by adding salt to food.  Try to cook at home, eat more fresh fruits and vegetables, and eat less fast  food, canned, processed, or prepared foods. This information is not intended to replace advice given to you by your health care provider. Make sure you discuss any questions you have with your health care provider. Document Released: 06/17/2001 Document Revised: 12/20/2015 Document Reviewed: 12/20/2015 Elsevier Interactive Patient Education  2017 [place] for Gastroesophageal Reflux Disease, Adult When you have gastroesophageal reflux disease (GERD), the foods you eat and your eating habits are very important. Choosing the right foods can help ease your discomfort. What guidelines do I need to follow?  Choose fruits, vegetables, whole grains, and low-fat dairy products.  Choose low-fat meat, fish, and poultry.  Limit fats such as oils, salad dressings, butter, nuts, and avocado.  Keep a food diary. This helps you identify foods that cause symptoms.  Avoid foods that cause symptoms. These may be different for everyone.  Eat small meals often instead of 3 large meals a day.  Eat your meals slowly, in a place where you are relaxed.  Limit fried foods.  Cook foods using methods other than frying.  Avoid drinking alcohol.  Avoid drinking large amounts of liquids with your meals.  Avoid bending over or lying down until 2-3 hours after eating. What foods are not recommended? These are some foods and drinks that may make your symptoms worse: Vegetables  Tomatoes. Tomato juice. Tomato and spaghetti sauce. Chili peppers. Onion and garlic. Horseradish. Fruits  Oranges, grapefruit, and lemon (fruit and juice). Meats  High-fat meats, fish, and poultry. This includes hot dogs, ribs, ham, sausage, salami, and bacon. Dairy  Whole milk and chocolate milk. Sour cream. Cream. Butter. Ice cream. Cream cheese. Drinks  Coffee and tea. Bubbly (carbonated) drinks or energy drinks. Condiments  Hot sauce. Barbecue sauce. Sweets/Desserts  Chocolate and cocoa. Donuts.  Peppermint and spearmint. Fats and Oils  High-fat foods. This includes [place] fries and potato chips. Other  Vinegar. Strong spices. This includes black pepper, white pepper, red pepper, cayenne, curry powder, cloves, ginger, and chili powder. The items listed above may not be a complete list of foods and drinks to avoid. Contact your dietitian for more information.  This information is not intended to replace advice given to you by your health care provider. Make sure you discuss any questions you have with your health care provider. Document Released: 06/27/2011 Document Revised: 06/03/2015 Document Reviewed: 10/30/2012 Elsevier Interactive Patient Education  2017 [employer].

## 2016-04-25 NOTE — Progress Notes (Signed)
Tara Keller, is a 51 y.o. female  UEA:[id number]  YNW:[id number]  DOB - August 17, 1965  Chief Complaint  Patient presents with  . Diabetes  . Vaginal Discharge  . Vaginal Itching        Subjective:   Tara Keller is a 51 y.o. female here today for a follow up visit, last seen 01/17/16, w/ IDDM, osa on cpap but per pt not tolerating the current pressure settings, continued tob abuse, morbid obesity. Notes increase stress lately, not watching her diet, and smoking more.  c/o of pruritis in vaginal area w/ dark brown discharge last 2-3 wks. Engaged.   Patient has No headache, No chest pain, No abdominal pain - No Nausea, No new weakness tingling or numbness, No Cough - SOB.  No problems updated.  ALLERGIES: No Known Allergies  PAST MEDICAL HISTORY: Past Medical History:  Diagnosis Date  . Anxiety   . Arthritis   . Asthma   . Depression   . Diabetes mellitus   . Fatty liver   . Fracture closed of upper end of forearm 9 years, Fx left left leg  . Fracture of left lower leg @ 51 years old  . H/O tubal ligation   . Hiatal hernia   . Hypertension   . Obesity   . Positive H. pylori test   . Sleep apnea    uses CPAP  . Tubular adenoma of colon     MEDICATIONS AT HOME: Prior to Admission medications   Medication Sig Start Date End Date Taking? Authorizing Provider  ACCU-CHEK AVIVA PLUS test strip TEST three times a day 09/08/15   Maren Reamer, MD  ACCU-CHEK FASTCLIX LANCETS MISC Use as directed 12/22/15   Maren Reamer, MD  albuterol (PROVENTIL HFA;VENTOLIN HFA) 108 (90 Base) MCG/ACT inhaler Inhale 1-2 puffs into the lungs every 6 (six) hours as needed for wheezing or shortness of breath. 06/13/76   Delora Fuel, MD  aspirin EC 81 MG tablet Take 1 tablet (81 mg total) by mouth daily. 04/25/16   Maren Reamer, MD  buPROPion (WELLBUTRIN XL) 150 MG 24 hr tablet Take 450 mg by mouth daily.    Historical Provider, MD  clonazePAM (KLONOPIN) 1 MG tablet Take 1 mg by mouth at  bedtime.    Historical Provider, MD  DULoxetine (CYMBALTA) 60 MG capsule Take 60 mg by mouth 2 (two) times daily.     Historical Provider, MD  gabapentin (NEURONTIN) 100 MG capsule Take 1 capsule (100 mg total) by mouth every morning. 04/25/16   Maren Reamer, MD  hydrOXYzine (VISTARIL) 25 MG capsule Take 4 capsules (100 mg total) by mouth 3 (three) times daily as needed for itching. Patient taking differently: Take 25 mg by mouth 3 (three) times daily as needed for itching.  02/25/14   Brayton Caves, PA-C  insulin aspart (NOVOLOG) 100 UNIT/ML FlexPen Inject 15 Units into the skin 3 (three) times daily with meals. 04/25/16   Maren Reamer, MD  Insulin Glargine (LANTUS SOLOSTAR) 100 UNIT/ML Solostar Pen Inject 45 Units into the skin daily at 10 pm. 04/25/16   Maren Reamer, MD  Insulin Pen Needle 31G X 8 MM MISC Use as directed for 4 times daily insulin administration. 10/08/15   Maren Reamer, MD  Insulin Syringe-Needle U-100 (BD INSULIN SYRINGE ULTRAFINE) 31G X 15/64" 0.5 ML MISC Use as directed 08/11/15   Tresa Garter, MD  INVOKANA 300 MG TABS tablet Take 1 tablet (300 mg total) by  mouth daily. 01/17/16   Maren Reamer, MD  lisinopril (PRINIVIL,ZESTRIL) 5 MG tablet Take 1 tablet (5 mg total) by mouth daily. 04/25/16   Maren Reamer, MD  metFORMIN (GLUCOPHAGE XR) 500 MG 24 hr tablet Take 2 tablets (1,000 mg total) by mouth 2 (two) times daily. 04/25/16   Maren Reamer, MD  metoCLOPramide (REGLAN) 5 MG tablet Take 1 tablet (5 mg total) by mouth 3 (three) times daily before meals. 03/13/16   Jerene Bears, MD  NEEDLE, DISP, 24 G (B-D DISP NEEDLE 24GX1") 24G X 1" MISC Use with solostar pen to inject 4 times daily 07/09/14   Tresa Garter, MD  nicotine (NICODERM CQ) 21 mg/24hr patch Place 1 patch (21 mg total) onto the skin daily. 04/25/16   Maren Reamer, MD  pantoprazole (PROTONIX) 40 MG tablet Take 1 tablet (40 mg total) by mouth 2 (two) times daily before a meal. 03/13/16   Jerene Bears, MD  pravastatin (PRAVACHOL) 20 MG tablet Take 1 tablet (20 mg total) by mouth daily. 04/25/16   Maren Reamer, MD  zolpidem (AMBIEN) 5 MG tablet Take 5 mg by mouth at bedtime.    Historical Provider, MD     Objective:   Vitals:   04/25/16 1036  BP: 139/85  Pulse: 87  Resp: 16  Temp: 97.9 F (36.6 C)  TempSrc: Oral  SpO2: 98%  Weight: (!) 304 lb 3.2 oz (138 kg)   cma assisted pelvic exam.  Exam General appearance : Awake, alert, not in any distress. Speech Clear. Not toxic looking, morbid obese, AAF HEENT: Atraumatic and Normocephalic, pupils equally reactive to light. Neck: supple, no JVD.  Chest:Good air entry bilaterally, no added sounds. CVS: S1 S2 regular, no murmurs/gallups or rubs. Abdomen: Bowel sounds active, obese, Non tender and not distended with no gaurding, rigidity or rebound., limited exam due to body habitus Pelvic Exam: Cervix normal in appearance, external genitalia normal, no adnexal masses or tenderness, no cervical motion tenderness, rectovaginal septum normal, uterus normal size, shape, and consistency and vagina normal with thin white discharge   Extremities: B/L Lower Ext shows no edema, both legs are warm to touch Neurology: Awake alert, and oriented X 3, CN II-XII grossly intact, Non focal Skin:No Rash  Data Review Lab Results  Component Value Date   HGBA1C 10.3 04/25/2016   HGBA1C 9.4 01/17/2016   HGBA1C 9.8 08/30/2015    Depression screen PHQ 2/9 04/25/2016 01/17/2016 08/30/2015 07/06/2015 07/03/2014  Decreased Interest 2 1 1  0 0  Down, Depressed, Hopeless 2 2 1 2  0  PHQ - 2 Score 4 3 2 2  0  Altered sleeping 0 0 0 0 -  Tired, decreased energy 2 3 2 2  -  Change in appetite 0 1 2 3  -  Feeling bad or failure about yourself  2 1 3 1  -  Trouble concentrating 0 0 2 2 -  Moving slowly or fidgety/restless 0 0 1 0 -  Suicidal thoughts 0 0 1 0 -  PHQ-9 Score 8 8 13 10  -  Difficult doing work/chores - - - Very difficult -      Assessment  & Plan   1. Type 2 diabetes mellitus without complication, without long-term current use of insulin (HCC) Needs better control, dw pt diet/exercise - dw q3 day titration of lantus, instructions provided - increase lantus to 45 qhs - increase novolog to 15 qac - continue metformin 1000bid - POCT glucose (manual entry) 389 -->  395 --> 344 - POCT glycosylated hemoglobin (Hb A1C)  10.3 - POCT urinalysis dipstick - insulin aspart (novoLOG) injection 20 Units; Inject 0.2 mLs (20 Units x2  total) into the skin once. - Lipid Panel - start pravastin 20 qhs for ASCVD protection, her calculated ASCVD risk is 20%, compared to <0.7% risk of bleeding, start asa 81 mg qd as well. [facility] 2 wks for dm check.  2. elavated bp, in setting of diabetes -started pt on lisinopril 5 qd.  3. OSA (obstructive sleep apnea) Not tolerating cpap, I had ordered cpap titration in March, but she has not heard from them, will reorder today. - Cpap titration; Future  4. Smoking Khalila was counseled on the dangers of tobacco use, and was advised to quit. Reviewed strategies to maximize success, including removing cigarettes and smoking materials from environment, stress management and support of family/friends. - nicoderm 21 qd trial, For your tobacco abuse: Strongly recommend that he stop smoking back and all other inhaled products right away Call 1-800-Quit-Now to get free nicotine replacement from the state of [place]   5. Gastroesophageal reflux disease without esophagitis Seeing gi, better on reglan and ppi bid. dw pt long term risk of chronic ppi may lead to ckd  6. Depression, unspecified depression type See Dr Leim Fabry, at [facility] psyche for hydroxyzine pnr, cymbalta, klonopin, denies si/hi/avh today  7. Vaginal pruritus - Cytology - PAP, chk stds/hpv, herpes,  8. Pap smear for cervical cancer screening - Cytology - PAP  9. Morbid obesity ([place]) Weight loss recd, would help tremendously,  continue to exercise as tolerated    Patient have been counseled extensively about nutrition and exercise  Return in about 3 months (around 07/25/2016), or if symptoms worsen or fail to improve.  The patient was given clear instructions to go to ER or return to medical center if symptoms don't improve, worsen or new problems develop. The patient verbalized understanding. The patient was told to call to get lab results if they haven't heard anything in the next week.   This note has been created with Surveyor, quantity. Any transcriptional errors are unintentional.   Maren Reamer, MD, [facility] [place], [place]   04/25/2016, 12:36 PM

## 2016-04-26 ENCOUNTER — Encounter: Payer: Self-pay | Admitting: Internal Medicine

## 2016-04-26 LAB — CERVICOVAGINAL ANCILLARY ONLY
Chlamydia: NEGATIVE
NEISSERIA GONORRHEA: NEGATIVE
Wet Prep (BD Affirm): NEGATIVE

## 2016-04-26 LAB — CYTOLOGY - PAP
DIAGNOSIS: NEGATIVE
HPV: NOT DETECTED

## 2016-04-26 LAB — LIPID PANEL
CHOL/HDL RATIO: 4.3 ratio (ref 0.0–4.4)
CHOLESTEROL TOTAL: 167 mg/dL (ref 100–199)
HDL: 39 mg/dL — ABNORMAL LOW (ref >39)
LDL CALC: 79 mg/dL (ref 0–99)
Triglycerides: 246 mg/dL — ABNORMAL HIGH (ref 0–149)
VLDL CHOLESTEROL CAL: 49 mg/dL — AB (ref 5–40)

## 2016-04-28 ENCOUNTER — Telehealth: Payer: Self-pay

## 2016-04-28 NOTE — Telephone Encounter (Signed)
Contacted pt to go over lab results pt didn't answer lvm asking pt to give me a call at her earliest convenience   If pt calls back please give results: Her cholesterol still high - needs better diabetes control. To address this please limit saturated fat to no more than 7% of your calories, limit cholesterol to 200 mg/day, increase fiber and exercise as tolerated. If needed we may add another cholesterol lowering medication to your regimen. Continue pravastatin currently. Waiting for pap labs.

## 2016-05-01 LAB — CERVICOVAGINAL ANCILLARY ONLY: HERPES (WINDOWPATH): NEGATIVE

## 2016-05-03 ENCOUNTER — Telehealth: Payer: Self-pay

## 2016-05-03 NOTE — Telephone Encounter (Signed)
Pt is aware of pap results and doesn't have any questions or concerns

## 2016-05-10 ENCOUNTER — Ambulatory Visit: Payer: Medicaid Other | Admitting: Pharmacist

## 2016-05-25 ENCOUNTER — Encounter: Payer: Self-pay | Admitting: Internal Medicine

## 2016-05-26 ENCOUNTER — Encounter: Payer: Self-pay | Admitting: Internal Medicine

## 2016-05-29 ENCOUNTER — Encounter: Payer: Self-pay | Admitting: Internal Medicine

## 2016-06-01 ENCOUNTER — Encounter (HOSPITAL_BASED_OUTPATIENT_CLINIC_OR_DEPARTMENT_OTHER): Payer: Medicaid Other

## 2016-06-12 ENCOUNTER — Ambulatory Visit (HOSPITAL_COMMUNITY)
Admission: EM | Admit: 2016-06-12 | Discharge: 2016-06-12 | Disposition: A | Discharge status: Home / Self Care | Payer: Medicaid Other | Attending: Emergency Medicine | Admitting: Emergency Medicine

## 2016-06-12 ENCOUNTER — Encounter (HOSPITAL_COMMUNITY): Payer: Self-pay | Admitting: Emergency Medicine

## 2016-06-12 DIAGNOSIS — Z794 Long term (current) use of insulin: Secondary | ICD-10-CM | POA: Diagnosis not present

## 2016-06-12 DIAGNOSIS — E119 Type 2 diabetes mellitus without complications: Secondary | ICD-10-CM | POA: Diagnosis not present

## 2016-06-12 DIAGNOSIS — G4730 Sleep apnea, unspecified: Secondary | ICD-10-CM | POA: Insufficient documentation

## 2016-06-12 DIAGNOSIS — F17210 Nicotine dependence, cigarettes, uncomplicated: Secondary | ICD-10-CM | POA: Insufficient documentation

## 2016-06-12 DIAGNOSIS — B373 Candidiasis of vulva and vagina: Secondary | ICD-10-CM

## 2016-06-12 DIAGNOSIS — I10 Essential (primary) hypertension: Secondary | ICD-10-CM | POA: Insufficient documentation

## 2016-06-12 DIAGNOSIS — F419 Anxiety disorder, unspecified: Secondary | ICD-10-CM | POA: Insufficient documentation

## 2016-06-12 DIAGNOSIS — B3731 Acute candidiasis of vulva and vagina: Secondary | ICD-10-CM

## 2016-06-12 DIAGNOSIS — Z7982 Long term (current) use of aspirin: Secondary | ICD-10-CM | POA: Insufficient documentation

## 2016-06-12 DIAGNOSIS — F329 Major depressive disorder, single episode, unspecified: Secondary | ICD-10-CM | POA: Insufficient documentation

## 2016-06-12 DIAGNOSIS — L298 Other pruritus: Secondary | ICD-10-CM | POA: Diagnosis present

## 2016-06-12 LAB — GLUCOSE, CAPILLARY: Glucose-Capillary: 436 mg/dL — ABNORMAL HIGH (ref 65–99)

## 2016-06-12 MED ORDER — FLUCONAZOLE 150 MG PO TABS: 150.0000 mg | ORAL_TABLET | Freq: Every day | ORAL | 1 refills | Status: DC

## 2016-06-12 NOTE — ED Provider Notes (Signed)
CSN: [id number]     Arrival date & time 06/12/16  1402 History   None    Chief Complaint  Patient presents with  . Vaginal Itching   (Consider location/radiation/quality/duration/timing/severity/associated sxs/prior Treatment) The history is provided by the patient. No language interpreter was used.  Vaginal Itching  This is a new problem. The current episode started more than 1 week ago. The problem occurs constantly. The problem has been gradually worsening. Nothing aggravates the symptoms. Nothing relieves the symptoms. She has tried nothing for the symptoms. The treatment provided moderate relief.  Pt reports she has not been taking her diabetes medications.  Pt complains of vaginal itching.  Pt recently had a break up with her fiance.  Pt reports she has not been taking care of herself.    Past Medical History:  Diagnosis Date  . Anxiety   . Arthritis   . Asthma   . Depression   . Diabetes mellitus   . Fatty liver   . Fracture closed of upper end of forearm 9 years, Fx left left leg  . Fracture of left lower leg @ 51 years old  . H/O tubal ligation   . Hiatal hernia   . Hypertension   . Obesity   . Positive H. pylori test   . Sleep apnea    uses CPAP  . Tubular adenoma of colon    Past Surgical History:  Procedure Laterality Date  . CHOLECYSTECTOMY    . COLONOSCOPY WITH PROPOFOL N/A 08/15/2013   Procedure: COLONOSCOPY WITH PROPOFOL;  Surgeon: Jerene Bears, MD;  Location: [facility] END[facility]COPY;  Service: Gastroenterology;  Laterality: N/A;  . ESOPHAGOGASTRODUODENOSCOPY N/A 05/12/2014   Procedure: ESOPHAGOGASTRODUODENOSCOPY (EGD);  Surgeon: Jerene Bears, MD;  Location: [facility];  Service: Gastroenterology;  Laterality: N/A;  . ESOPHAGOGASTRODUODENOSCOPY (EGD) WITH PROPOFOL N/A 08/15/2013   Procedure: ESOPHAGOGASTRODUODENOSCOPY (EGD) WITH PROPOFOL;  Surgeon: Jerene Bears, MD;  Location: [facility];  Service: Gastroenterology;  Laterality: N/A;  . TUBAL LIGATION Bilateral    Family  History  Problem Relation Age of Onset  . Diabetes Mother   . Stroke Mother   . Hypertension Mother   . Breast cancer Sister   . Heart attack Sister   . Other Daughter        died at age 34   Social History  Substance Use Topics  . Smoking status: Current Every Day Smoker    Packs/day: 0.25    Types: Cigarettes  . Smokeless tobacco: Never Used  . Alcohol use No   OB History    Gravida Para Term Preterm AB Living   3 3 2 1   4    SAB TAB Ectopic Multiple Live Births         1       Review of Systems  All other systems reviewed and are negative.   Allergies  Patient has no known allergies.  Home Medications   Prior to Admission medications   Medication Sig Start Date End Date Taking? Authorizing Provider  ACCU-CHEK AVIVA PLUS test strip TEST three times a day 09/08/15   Maren Reamer, MD  ACCU-CHEK FASTCLIX LANCETS MISC Use as directed 12/22/15   Maren Reamer, MD  albuterol (PROVENTIL HFA;VENTOLIN HFA) 108 (90 Base) MCG/ACT inhaler Inhale 1-2 puffs into the lungs every 6 (six) hours as needed for wheezing or shortness of breath. 02/10/61   Delora Fuel, MD  aspirin EC 81 MG tablet Take 1 tablet (81 mg total) by mouth daily.  04/25/16   Langeland, Leda Quail, MD  buPROPion (WELLBUTRIN XL) 150 MG 24 hr tablet Take 450 mg by mouth daily.    [provider]  clonazePAM (KLONOPIN) 1 MG tablet Take 1 mg by mouth at bedtime.    [provider]  DULoxetine (CYMBALTA) 60 MG capsule Take 60 mg by mouth 2 (two) times daily.     [provider]  gabapentin (NEURONTIN) 100 MG capsule Take 1 capsule (100 mg total) by mouth every morning. 04/25/16   Maren Reamer, MD  hydrOXYzine (VISTARIL) 25 MG capsule Take 4 capsules (100 mg total) by mouth 3 (three) times daily as needed for itching. Patient taking differently: Take 25 mg by mouth 3 (three) times daily as needed for itching.  02/25/14   Brayton Caves, PA-C  insulin aspart (NOVOLOG) 100 UNIT/ML FlexPen  Inject 15 Units into the skin 3 (three) times daily with meals. 04/25/16   Maren Reamer, MD  Insulin Glargine (LANTUS SOLOSTAR) 100 UNIT/ML Solostar Pen Inject 45 Units into the skin daily at 10 pm. 04/25/16   Lottie Mussel T, MD  Insulin Pen Needle 31G X 8 MM MISC Use as directed for 4 times daily insulin administration. 10/08/15   Maren Reamer, MD  Insulin Syringe-Needle U-100 (BD INSULIN SYRINGE ULTRAFINE) 31G X 15/64" 0.5 ML MISC Use as directed 08/11/15   Tresa Garter, MD  INVOKANA 300 MG TABS tablet Take 1 tablet (300 mg total) by mouth daily. 01/17/16   Langeland, Leda Quail, MD  lisinopril (PRINIVIL,ZESTRIL) 5 MG tablet Take 1 tablet (5 mg total) by mouth daily. 04/25/16   Maren Reamer, MD  metFORMIN (GLUCOPHAGE XR) 500 MG 24 hr tablet Take 2 tablets (1,000 mg total) by mouth 2 (two) times daily. 04/25/16   Maren Reamer, MD  metoCLOPramide (REGLAN) 5 MG tablet Take 1 tablet (5 mg total) by mouth 3 (three) times daily before meals. 03/13/16   Pyrtle, Lajuan Lines, MD  NEEDLE, DISP, 24 G (B-D DISP NEEDLE 24GX1") 24G X 1" MISC Use with solostar pen to inject 4 times daily 07/09/14   Tresa Garter, MD  nicotine (NICODERM CQ) 21 mg/24hr patch Place 1 patch (21 mg total) onto the skin daily. 04/25/16   Maren Reamer, MD  pantoprazole (PROTONIX) 40 MG tablet Take 1 tablet (40 mg total) by mouth 2 (two) times daily before a meal. 03/13/16   Pyrtle, Lajuan Lines, MD  pravastatin (PRAVACHOL) 20 MG tablet Take 1 tablet (20 mg total) by mouth daily. 04/25/16   Langeland, Leda Quail, MD  zolpidem (AMBIEN) 5 MG tablet Take 5 mg by mouth at bedtime.    [provider]   Meds Ordered and Administered this Visit  Medications - No data to display  BP 126/76 (BP Location: Right Arm)   Pulse 95   Temp 97.9 F (36.6 C) (Oral)   Resp (!) 22   Wt 299 lb (135.6 kg)   SpO2 97%   BMI 47.54 kg/m  No data found.   Physical Exam  Constitutional: She appears well-developed and well-nourished. No  distress.  HENT:  Head: Normocephalic and atraumatic.  Eyes: Conjunctivae are normal.  Neck: Neck supple.  Cardiovascular: Normal rate and regular rhythm.   No murmur heard. Pulmonary/Chest: Effort normal and breath sounds normal. No respiratory distress.  Abdominal: Soft. There is no tenderness.  Genitourinary:  Genitourinary Comments: Erythema, swelling labia, white exudate,  Looks like yeast.    Musculoskeletal: She exhibits no  edema.  Neurological: She is alert.  Skin: Skin is warm and dry.  Psychiatric: She has a normal mood and affect.  Nursing note and vitals reviewed.   Urgent Care Course     Procedures (including critical care time)  Labs Review Labs Reviewed  CERVICOVAGINAL ANCILLARY ONLY    Imaging Review No results found.   Visual Acuity Review  Right Eye Distance:   Left Eye Distance:   Bilateral Distance:    Right Eye Near:   Left Eye Near:    Bilateral Near:         MDM  Pt has elevated cbg.   Pt has insulin and metformin.  Pt advised that elevated glucose can contribute to infections/yeast.   Pt counseled on importance of taking her diabetes drugs.  I will treat her for yeast vaginitis.  Cultures for gc and ct obtained.   1. Yeast vaginitis    An After Visit Summary was printed and given to the patient. Meds ordered this encounter  Medications  . fluconazole (DIFLUCAN) 150 MG tablet    Sig: Take 1 tablet (150 mg total) by mouth daily.    Dispense:  3 tablet    Refill:  1    Order Specific Question:   Supervising Provider    Answer:   Melynda Ripple [[id number]]      Fransico Meadow, PA-C 06/12/16 1708

## 2016-06-12 NOTE — Discharge Instructions (Signed)
Return if any problems.  Take your diabetes medications.

## 2016-06-12 NOTE — ED Triage Notes (Signed)
Vaginal discharge and itching for 2 weeks

## 2016-06-13 LAB — CERVICOVAGINAL ANCILLARY ONLY
Bacterial vaginitis: POSITIVE — AB
Candida vaginitis: POSITIVE — AB
Chlamydia: NEGATIVE
Neisseria Gonorrhea: NEGATIVE
Trichomonas: NEGATIVE

## 2016-06-14 ENCOUNTER — Ambulatory Visit: Payer: Medicaid Other | Admitting: Podiatry

## 2016-06-15 ENCOUNTER — Telehealth (HOSPITAL_COMMUNITY): Payer: Self-pay | Admitting: Emergency Medicine

## 2016-06-15 MED ORDER — METRONIDAZOLE 500 MG PO TABS: 500.0000 mg | ORAL_TABLET | Freq: Two times a day (BID) | ORAL | 0 refills | Status: DC

## 2016-06-15 NOTE — Telephone Encounter (Signed)
Called pt and notified of recent lab results Pt ID'd properly... Reports feeling better but still having a burning sensation/irritation Pt is taking/tolarating well meds given at visit.  Per her request... Called in Flagyl 500 mg to [facility] [employer]) Adv pt if sx are not getting better to return or to f/u w/PCP Education on safe sex given Notified pt that lab results can be obtained through MyChart Pt verb understanding.

## 2016-07-03 ENCOUNTER — Ambulatory Visit (HOSPITAL_COMMUNITY)
Admission: EM | Admit: 2016-07-03 | Discharge: 2016-07-03 | Disposition: A | Discharge status: Home / Self Care | Payer: Medicaid Other | Attending: Family Medicine | Admitting: Family Medicine

## 2016-07-03 ENCOUNTER — Encounter (HOSPITAL_COMMUNITY): Payer: Self-pay | Admitting: Emergency Medicine

## 2016-07-03 DIAGNOSIS — E669 Obesity, unspecified: Secondary | ICD-10-CM | POA: Diagnosis not present

## 2016-07-03 DIAGNOSIS — Z7982 Long term (current) use of aspirin: Secondary | ICD-10-CM | POA: Diagnosis not present

## 2016-07-03 DIAGNOSIS — E1165 Type 2 diabetes mellitus with hyperglycemia: Secondary | ICD-10-CM | POA: Insufficient documentation

## 2016-07-03 DIAGNOSIS — F329 Major depressive disorder, single episode, unspecified: Secondary | ICD-10-CM | POA: Diagnosis not present

## 2016-07-03 DIAGNOSIS — L298 Other pruritus: Secondary | ICD-10-CM | POA: Diagnosis present

## 2016-07-03 DIAGNOSIS — Z79899 Other long term (current) drug therapy: Secondary | ICD-10-CM | POA: Diagnosis not present

## 2016-07-03 DIAGNOSIS — M1990 Unspecified osteoarthritis, unspecified site: Secondary | ICD-10-CM | POA: Diagnosis not present

## 2016-07-03 DIAGNOSIS — F419 Anxiety disorder, unspecified: Secondary | ICD-10-CM | POA: Diagnosis not present

## 2016-07-03 DIAGNOSIS — F17210 Nicotine dependence, cigarettes, uncomplicated: Secondary | ICD-10-CM | POA: Insufficient documentation

## 2016-07-03 DIAGNOSIS — J45909 Unspecified asthma, uncomplicated: Secondary | ICD-10-CM | POA: Insufficient documentation

## 2016-07-03 DIAGNOSIS — N760 Acute vaginitis: Secondary | ICD-10-CM | POA: Diagnosis not present

## 2016-07-03 DIAGNOSIS — I10 Essential (primary) hypertension: Secondary | ICD-10-CM | POA: Diagnosis not present

## 2016-07-03 DIAGNOSIS — Z794 Long term (current) use of insulin: Secondary | ICD-10-CM | POA: Diagnosis not present

## 2016-07-03 MED ORDER — METRONIDAZOLE 500 MG PO TABS: 500.0000 mg | ORAL_TABLET | Freq: Two times a day (BID) | ORAL | 0 refills | Status: DC

## 2016-07-03 MED ORDER — FLUCONAZOLE 200 MG PO TABS: 200.0000 mg | ORAL_TABLET | Freq: Every day | ORAL | 0 refills | Status: AC

## 2016-07-03 NOTE — ED Triage Notes (Signed)
The patient presented to the [facility] with a complaint of recurrent vaginal pain and itching as well as some "bumps on the sides" x 4 days.

## 2016-07-03 NOTE — ED Provider Notes (Signed)
CSN: [id number]     Arrival date & time 07/03/16  1250 History   None    Chief Complaint  Patient presents with  . Vaginal Itching   (Consider location/radiation/quality/duration/timing/severity/associated sxs/prior Treatment) Francena Zender Halbleib is a 51 y.o. female with a past history of uncontrolled diabetes, arthritis, hypertension, and obesity exposed other conditions, who presents to the [facility] urgent care with a chief complaint of vaginitis ongoing for 1-2 weeks. She was seen in our clinic on 06/12/2016, diagnosed with a yeast infection. She is started on Diflucan, and states she got somewhat better. However, her symptoms have returned. She is not sexually active, has not had intercourse in some time. She is not concerned about STD or STI exposure. She is postmenopausal, no abnormal uterine bleeding, no fever, chills, nausea, vomiting, or other symptoms.    The history is provided by the patient.  Vaginal Itching     Past Medical History:  Diagnosis Date  . Anxiety   . Arthritis   . Asthma   . Depression   . Diabetes mellitus   . Fatty liver   . Fracture closed of upper end of forearm 9 years, Fx left left leg  . Fracture of left lower leg @ 51 years old  . H/O tubal ligation   . Hiatal hernia   . Hypertension   . Obesity   . Positive H. pylori test   . Sleep apnea    uses CPAP  . Tubular adenoma of colon    Past Surgical History:  Procedure Laterality Date  . CHOLECYSTECTOMY    . COLONOSCOPY WITH PROPOFOL N/A 08/15/2013   Procedure: COLONOSCOPY WITH PROPOFOL;  Surgeon: Jerene Bears, MD;  Location: [facility];  Service: Gastroenterology;  Laterality: N/A;  . ESOPHAGOGASTRODUODENOSCOPY N/A 05/12/2014   Procedure: ESOPHAGOGASTRODUODENOSCOPY (EGD);  Surgeon: Jerene Bears, MD;  Location: [facility];  Service: Gastroenterology;  Laterality: N/A;  . ESOPHAGOGASTRODUODENOSCOPY (EGD) WITH PROPOFOL N/A 08/15/2013   Procedure: ESOPHAGOGASTRODUODENOSCOPY (EGD) WITH PROPOFOL;   Surgeon: Jerene Bears, MD;  Location: [facility];  Service: Gastroenterology;  Laterality: N/A;  . TUBAL LIGATION Bilateral    Family History  Problem Relation Age of Onset  . Diabetes Mother   . Stroke Mother   . Hypertension Mother   . Breast cancer Sister   . Heart attack Sister   . Other Daughter        died at age 32   Social History  Substance Use Topics  . Smoking status: Current Every Day Smoker    Packs/day: 0.25    Types: Cigarettes  . Smokeless tobacco: Never Used  . Alcohol use No   OB History    Gravida Para Term Preterm AB Living   3 3 2 1   4    SAB TAB Ectopic Multiple Live Births         1       Review of Systems  Constitutional: Negative.   HENT: Negative.   Respiratory: Negative.   Cardiovascular: Negative.   Gastrointestinal: Negative.   Genitourinary: Positive for genital sores and vaginal discharge. Negative for difficulty urinating, dysuria, pelvic pain and vaginal bleeding.       Vaginal itching  Musculoskeletal: Negative.   Skin: Negative.   Neurological: Negative.     Allergies  Patient has no known allergies.  Home Medications   Prior to Admission medications   Medication Sig Start Date End Date Taking? Authorizing Provider  ACCU-CHEK AVIVA PLUS test strip TEST three  times a day 09/08/15   Maren Reamer, MD  ACCU-CHEK FASTCLIX LANCETS MISC Use as directed 12/22/15   Maren Reamer, MD  albuterol (PROVENTIL HFA;VENTOLIN HFA) 108 (90 Base) MCG/ACT inhaler Inhale 1-2 puffs into the lungs every 6 (six) hours as needed for wheezing or shortness of breath. 07/12/92   Delora Fuel, MD  aspirin EC 81 MG tablet Take 1 tablet (81 mg total) by mouth daily. 04/25/16   Langeland, Leda Quail, MD  buPROPion (WELLBUTRIN XL) 150 MG 24 hr tablet Take 450 mg by mouth daily.    [provider]  clonazePAM (KLONOPIN) 1 MG tablet Take 1 mg by mouth at bedtime.    [provider]  DULoxetine (CYMBALTA) 60 MG capsule Take 60 mg by mouth 2  (two) times daily.     [provider]  fluconazole (DIFLUCAN) 200 MG tablet Take 1 tablet (200 mg total) by mouth daily. 07/03/16 07/10/16  Barnet Glasgow, NP  gabapentin (NEURONTIN) 100 MG capsule Take 1 capsule (100 mg total) by mouth every morning. 04/25/16   Langeland, Dawn T, MD  insulin aspart (NOVOLOG) 100 UNIT/ML FlexPen Inject 15 Units into the skin 3 (three) times daily with meals. 04/25/16   Maren Reamer, MD  Insulin Glargine (LANTUS SOLOSTAR) 100 UNIT/ML Solostar Pen Inject 45 Units into the skin daily at 10 pm. 04/25/16   Lottie Mussel T, MD  Insulin Pen Needle 31G X 8 MM MISC Use as directed for 4 times daily insulin administration. 10/08/15   Maren Reamer, MD  Insulin Syringe-Needle U-100 (BD INSULIN SYRINGE ULTRAFINE) 31G X 15/64" 0.5 ML MISC Use as directed 08/11/15   Tresa Garter, MD  INVOKANA 300 MG TABS tablet Take 1 tablet (300 mg total) by mouth daily. 01/17/16   Langeland, Leda Quail, MD  lisinopril (PRINIVIL,ZESTRIL) 5 MG tablet Take 1 tablet (5 mg total) by mouth daily. 04/25/16   Maren Reamer, MD  metFORMIN (GLUCOPHAGE XR) 500 MG 24 hr tablet Take 2 tablets (1,000 mg total) by mouth 2 (two) times daily. 04/25/16   Maren Reamer, MD  metoCLOPramide (REGLAN) 5 MG tablet Take 1 tablet (5 mg total) by mouth 3 (three) times daily before meals. 03/13/16   Pyrtle, Lajuan Lines, MD  metroNIDAZOLE (FLAGYL) 500 MG tablet Take 1 tablet (500 mg total) by mouth 2 (two) times daily. 07/03/16   Barnet Glasgow, NP  NEEDLE, DISP, 24 G (B-D DISP NEEDLE 24GX1") 24G X 1" MISC Use with solostar pen to inject 4 times daily 07/09/14   Tresa Garter, MD  nicotine (NICODERM CQ) 21 mg/24hr patch Place 1 patch (21 mg total) onto the skin daily. 04/25/16   Maren Reamer, MD  pantoprazole (PROTONIX) 40 MG tablet Take 1 tablet (40 mg total) by mouth 2 (two) times daily before a meal. 03/13/16   Pyrtle, Lajuan Lines, MD  pravastatin (PRAVACHOL) 20 MG tablet Take 1 tablet (20 mg total) by  mouth daily. 04/25/16   Langeland, Leda Quail, MD  zolpidem (AMBIEN) 5 MG tablet Take 5 mg by mouth at bedtime.    [provider]   Meds Ordered and Administered this Visit  Medications - No data to display  BP 126/84 (BP Location: Right Arm)   Pulse 79   Temp 98.3 F (36.8 C) (Oral)   Resp 20   SpO2 99%  No data found.   Physical Exam  Constitutional: She is oriented to person, place, and time. She appears well-developed and well-nourished.  No distress.  HENT:  Head: Normocephalic and atraumatic.  Right Ear: External ear normal.  Left Ear: External ear normal.  Eyes: Conjunctivae are normal.  Neck: Normal range of motion.  Abdominal: Soft. She exhibits no distension. There is no tenderness. Hernia confirmed negative in the right inguinal area and confirmed negative in the left inguinal area.  Genitourinary: Uterus normal. Pelvic exam was performed with patient supine. There is no tenderness on the right labia. There is no tenderness on the left labia. Cervix exhibits discharge. Right adnexum displays no tenderness. Left adnexum displays no tenderness. Vaginal discharge found.  Lymphadenopathy:       Right: No inguinal adenopathy present.       Left: No inguinal adenopathy present.  Neurological: She is alert and oriented to person, place, and time.  Skin: Skin is warm and dry. Capillary refill takes less than 2 seconds. No rash noted. She is not diaphoretic. No erythema.  Psychiatric: She has a normal mood and affect. Her behavior is normal.  Nursing note and vitals reviewed.   Urgent Care Course     Procedures (including critical care time)  Labs Review Labs Reviewed  CERVICOVAGINAL ANCILLARY ONLY    Imaging Review No results found.    MDM   1. Vaginitis and vulvovaginitis     Kelley Polinsky Wenger is a 51 y.o. female with a past history of uncontrolled diabetes, arthritis, hypertension, and obesity exposed other conditions, who presents to the [facility] urgent  care with a chief complaint of vaginitis ongoing for 1-2 weeks. She was seen in our clinic on 06/12/2016, diagnosed with a yeast infection. She is started on Diflucan, and states she got somewhat better. However, her symptoms have returned. She is not sexually active, has not had intercourse in some time. She is not concerned about STD or STI exposure. She is postmenopausal, no abnormal uterine bleeding, no fever, chills, nausea, vomiting, or other symptoms.  Signs, symptoms, and history, neck most likely diagnosis yeast vaginitis starting on fluconazole, and metronidazole, testing for gonorrhea, chlamydia, Trichomonas, Candida and BV. Provided guidelines on follow-up care, recommend getting her diabetes under control as this is most likely contributing factor, follow-up with her primary care provider as needed.    Barnet Glasgow, NP 07/03/16 1349

## 2016-07-03 NOTE — Discharge Instructions (Signed)
These multiple yeast infections, most likely due to to your uncontrolled diabetes. I recommend you try to get your diabetes under control, and follow up with your regular doctor for diabetes care. For your vaginitis, starting metronidazole, take one tablet twice a day for 7 days, do not drink any alcohol while on this medicine or make you very ill. I also started on Diflucan, take one tablet daily as well. Follow up with your regular doctor if symptoms persist

## 2016-07-04 LAB — CERVICOVAGINAL ANCILLARY ONLY
Bacterial vaginitis: NEGATIVE
Candida vaginitis: POSITIVE — AB
Chlamydia: NEGATIVE
Neisseria Gonorrhea: NEGATIVE
TRICH (WINDOWPATH): NEGATIVE

## 2016-07-28 ENCOUNTER — Ambulatory Visit (HOSPITAL_COMMUNITY)
Admission: EM | Admit: 2016-07-28 | Discharge: 2016-07-28 | Disposition: A | Discharge status: Home / Self Care | Payer: Medicaid Other | Attending: Internal Medicine | Admitting: Internal Medicine

## 2016-07-28 ENCOUNTER — Encounter (HOSPITAL_COMMUNITY): Payer: Self-pay | Admitting: Emergency Medicine

## 2016-07-28 DIAGNOSIS — K5900 Constipation, unspecified: Secondary | ICD-10-CM | POA: Diagnosis not present

## 2016-07-28 DIAGNOSIS — R1012 Left upper quadrant pain: Secondary | ICD-10-CM | POA: Diagnosis not present

## 2016-07-28 LAB — POCT I-STAT, CHEM 8
BUN: 4 mg/dL — ABNORMAL LOW (ref 6–20)
CALCIUM ION: 1.16 mmol/L (ref 1.15–1.40)
CHLORIDE: 93 mmol/L — AB (ref 101–111)
Creatinine, Ser: 0.5 mg/dL (ref 0.44–1.00)
GLUCOSE: 295 mg/dL — AB (ref 65–99)
HCT: 47 % — ABNORMAL HIGH (ref 36.0–46.0)
HEMOGLOBIN: 16 g/dL — AB (ref 12.0–15.0)
POTASSIUM: 3.3 mmol/L — AB (ref 3.5–5.1)
Sodium: 136 mmol/L (ref 135–145)
TCO2: 28 mmol/L (ref 0–100)

## 2016-07-28 MED ORDER — DOCUSATE SODIUM 50 MG PO CAPS: 50.0000 mg | ORAL_CAPSULE | Freq: Two times a day (BID) | ORAL | 0 refills | Status: DC

## 2016-07-28 MED ORDER — BISACODYL 10 MG RE SUPP: 10.0000 mg | RECTAL | 0 refills | Status: DC | PRN

## 2016-07-28 MED ORDER — POLYETHYLENE GLYCOL 3350 17 G PO PACK: 17.0000 g | PACK | Freq: Every day | ORAL | 0 refills | Status: DC

## 2016-07-28 NOTE — Discharge Instructions (Signed)
We are treating your constipation to see if it will resolve your abdominal pain. Closely monitor your blood sugar. If you have worsening of symptoms, still not passing gas, to go to the emergency department for further workup.

## 2016-07-28 NOTE — ED Triage Notes (Signed)
The patient presented to the [facility] with a complaint of abdominal pain that gets worse after eating. The patient reported that she has not had a bowel movement in 3 days.

## 2016-07-28 NOTE — ED Provider Notes (Signed)
CSN: [id number]     Arrival date & time 07/28/16  1730 History   None    Chief Complaint  Patient presents with  . Constipation   (Consider location/radiation/quality/duration/timing/severity/associated sxs/prior Treatment) 51 year old female with history of diabetes, fatty liver, constipation, status post cholecystectomy, comes in for 3 day history of abdominal pain. She states she has not had a bowel movement in 3 days, no passing of gas. She has uncontrolled diabetes, fasting this morning was 310, and blood glucose of 240s after medication. She has generalized abdominal pain, but worse at the left upper quadrant. Pain is worse after food. Had small amounts of vomit this morning. Has been able to keep water and medications down, although it causes the pain to be worse. Denies weakness, dizziness, syncope. She has no liver disease through ultrasounds and abdominal CTs, last ultrasound done in 2017.       Past Medical History:  Diagnosis Date  . Anxiety   . Arthritis   . Asthma   . Depression   . Diabetes mellitus   . Fatty liver   . Fracture closed of upper end of forearm 9 years, Fx left left leg  . Fracture of left lower leg @ 51 years old  . H/O tubal ligation   . Hiatal hernia   . Hypertension   . Obesity   . Positive H. pylori test   . Sleep apnea    uses CPAP  . Tubular adenoma of colon    Past Surgical History:  Procedure Laterality Date  . CHOLECYSTECTOMY    . COLONOSCOPY WITH PROPOFOL N/A 08/15/2013   Procedure: COLONOSCOPY WITH PROPOFOL;  Surgeon: Jerene Bears, MD;  Location: [facility];  Service: Gastroenterology;  Laterality: N/A;  . ESOPHAGOGASTRODUODENOSCOPY N/A 05/12/2014   Procedure: ESOPHAGOGASTRODUODENOSCOPY (EGD);  Surgeon: Jerene Bears, MD;  Location: [facility];  Service: Gastroenterology;  Laterality: N/A;  . ESOPHAGOGASTRODUODENOSCOPY (EGD) WITH PROPOFOL N/A 08/15/2013   Procedure: ESOPHAGOGASTRODUODENOSCOPY (EGD) WITH PROPOFOL;  Surgeon: Jerene Bears,  MD;  Location: [facility];  Service: Gastroenterology;  Laterality: N/A;  . TUBAL LIGATION Bilateral    Family History  Problem Relation Age of Onset  . Diabetes Mother   . Stroke Mother   . Hypertension Mother   . Breast cancer Sister   . Heart attack Sister   . Other Daughter        died at age 51   Social History  Substance Use Topics  . Smoking status: Current Every Day Smoker    Packs/day: 0.25    Types: Cigarettes  . Smokeless tobacco: Never Used  . Alcohol use No   OB History    Gravida Para Term Preterm AB Living   3 3 2 1   4    SAB TAB Ectopic Multiple Live Births         1       Review of Systems  Reason unable to perform ROS: HPI as above.    Allergies  Patient has no known allergies.  Home Medications   Prior to Admission medications   Medication Sig Start Date End Date Taking? Authorizing Provider  ACCU-CHEK AVIVA PLUS test strip TEST three times a day 09/08/15   Maren Reamer, MD  ACCU-CHEK FASTCLIX LANCETS MISC Use as directed 12/22/15   Maren Reamer, MD  albuterol (PROVENTIL HFA;VENTOLIN HFA) 108 (90 Base) MCG/ACT inhaler Inhale 1-2 puffs into the lungs every 6 (six) hours as needed for wheezing or shortness of breath. 03/13/16  Delora Fuel, MD  aspirin EC 81 MG tablet Take 1 tablet (81 mg total) by mouth daily. 04/25/16   Maren Reamer, MD  bisacodyl (DULCOLAX) 10 MG suppository Place 1 suppository (10 mg total) rectally as needed for moderate constipation. 07/28/16   Tasia Catchings, Carleigh Buccieri V, PA-C  buPROPion (WELLBUTRIN XL) 150 MG 24 hr tablet Take 450 mg by mouth daily.    [provider]  clonazePAM (KLONOPIN) 1 MG tablet Take 1 mg by mouth at bedtime.    [provider]  docusate sodium (COLACE) 50 MG capsule Take 1 capsule (50 mg total) by mouth 2 (two) times daily. 07/28/16   Tasia Catchings, Ples Trudel V, PA-C  DULoxetine (CYMBALTA) 60 MG capsule Take 60 mg by mouth 2 (two) times daily.     [provider]  gabapentin (NEURONTIN) 100 MG  capsule Take 1 capsule (100 mg total) by mouth every morning. 04/25/16   Langeland, Dawn T, MD  insulin aspart (NOVOLOG) 100 UNIT/ML FlexPen Inject 15 Units into the skin 3 (three) times daily with meals. 04/25/16   Maren Reamer, MD  Insulin Glargine (LANTUS SOLOSTAR) 100 UNIT/ML Solostar Pen Inject 45 Units into the skin daily at 10 pm. 04/25/16   Lottie Mussel T, MD  Insulin Pen Needle 31G X 8 MM MISC Use as directed for 4 times daily insulin administration. 10/08/15   Maren Reamer, MD  Insulin Syringe-Needle U-100 (BD INSULIN SYRINGE ULTRAFINE) 31G X 15/64" 0.5 ML MISC Use as directed 08/11/15   Tresa Garter, MD  INVOKANA 300 MG TABS tablet Take 1 tablet (300 mg total) by mouth daily. 01/17/16   Langeland, Leda Quail, MD  lisinopril (PRINIVIL,ZESTRIL) 5 MG tablet Take 1 tablet (5 mg total) by mouth daily. 04/25/16   Maren Reamer, MD  metFORMIN (GLUCOPHAGE XR) 500 MG 24 hr tablet Take 2 tablets (1,000 mg total) by mouth 2 (two) times daily. 04/25/16   Maren Reamer, MD  metoCLOPramide (REGLAN) 5 MG tablet Take 1 tablet (5 mg total) by mouth 3 (three) times daily before meals. 03/13/16   Pyrtle, Lajuan Lines, MD  metroNIDAZOLE (FLAGYL) 500 MG tablet Take 1 tablet (500 mg total) by mouth 2 (two) times daily. 07/03/16   Barnet Glasgow, NP  NEEDLE, DISP, 24 G (B-D DISP NEEDLE 24GX1") 24G X 1" MISC Use with solostar pen to inject 4 times daily 07/09/14   Tresa Garter, MD  nicotine (NICODERM CQ) 21 mg/24hr patch Place 1 patch (21 mg total) onto the skin daily. 04/25/16   Maren Reamer, MD  pantoprazole (PROTONIX) 40 MG tablet Take 1 tablet (40 mg total) by mouth 2 (two) times daily before a meal. 03/13/16   Pyrtle, Lajuan Lines, MD  polyethylene glycol [facility]) packet Take 17 g by mouth daily. 07/28/16   Tasia Catchings, Shilynn Hoch V, PA-C  pravastatin (PRAVACHOL) 20 MG tablet Take 1 tablet (20 mg total) by mouth daily. 04/25/16   Langeland, Leda Quail, MD  zolpidem (AMBIEN) 5 MG tablet Take 5 mg by mouth at bedtime.     [provider]   Meds Ordered and Administered this Visit  Medications - No data to display  BP (!) 137/91 (BP Location: Left Arm)   Pulse 83   Temp 98.4 F (36.9 C) (Oral)   Resp 18   SpO2 98%  No data found.   Physical Exam  Constitutional: She is oriented to person, place, and time. She appears well-developed and well-nourished. No distress.  HENT:  Head: Normocephalic  and atraumatic.  Cardiovascular: Normal rate, regular rhythm and normal heart sounds.  Exam reveals no gallop and no friction rub.   No murmur heard. Pulmonary/Chest: Effort normal and breath sounds normal. She has no wheezes. She has no rales.  Abdominal: Soft. Bowel sounds are normal. There is tenderness (generlized tenderness, most significant at LUQ and epigastric. No guarding, no rebound).  Obese, no distension that is different from baseline  Neurological: She is alert and oriented to person, place, and time.  Skin: Skin is warm and dry.  Psychiatric: She has a normal mood and affect. Her behavior is normal. Judgment normal.    Urgent Care Course     Procedures (including critical care time)  Labs Review Labs Reviewed  POCT I-STAT, CHEM 8 - Abnormal; Notable for the following:       Result Value   Potassium 3.3 (*)    Chloride 93 (*)    BUN 4 (*)    Glucose, Bld 295 (*)    Hemoglobin 16.0 (*)    HCT 47.0 (*)    All other components within normal limits    Imaging Review No results found.      MDM   1. Constipation, unspecified constipation type    Discussed lab results with patient, but glucose 295, which is not high for the patient, given patient looks stable, without weakness, dizziness, diaphoresis, will have patient continue to monitor. Discussed with patient symptoms could be due to constipation, but also worried about bowel obstruction given prior abdominal surgery. Discussed with patient options of trying constipation medication, or heading to emergency department right  now. Patient states she would like to try constipation medication. Start Dulcolax suppository as soon as possible. Start Colace and MiraLAX. Strict return precautions given to patient.    Ok Edwards, PA-C 07/28/16 [phone]

## 2016-07-29 ENCOUNTER — Encounter (HOSPITAL_COMMUNITY): Payer: Self-pay | Admitting: Emergency Medicine

## 2016-07-29 ENCOUNTER — Emergency Department (HOSPITAL_COMMUNITY)
Admission: EM | Admit: 2016-07-29 | Discharge: 2016-07-30 | Disposition: A | Discharge status: Home / Self Care | Payer: Medicaid Other | Attending: Emergency Medicine | Admitting: Emergency Medicine

## 2016-07-29 DIAGNOSIS — R1010 Upper abdominal pain, unspecified: Secondary | ICD-10-CM | POA: Diagnosis present

## 2016-07-29 DIAGNOSIS — E119 Type 2 diabetes mellitus without complications: Secondary | ICD-10-CM | POA: Diagnosis not present

## 2016-07-29 DIAGNOSIS — R1013 Epigastric pain: Secondary | ICD-10-CM | POA: Insufficient documentation

## 2016-07-29 DIAGNOSIS — Z794 Long term (current) use of insulin: Secondary | ICD-10-CM | POA: Insufficient documentation

## 2016-07-29 DIAGNOSIS — I10 Essential (primary) hypertension: Secondary | ICD-10-CM | POA: Diagnosis not present

## 2016-07-29 DIAGNOSIS — J45909 Unspecified asthma, uncomplicated: Secondary | ICD-10-CM | POA: Diagnosis not present

## 2016-07-29 DIAGNOSIS — F17210 Nicotine dependence, cigarettes, uncomplicated: Secondary | ICD-10-CM | POA: Diagnosis not present

## 2016-07-29 DIAGNOSIS — F419 Anxiety disorder, unspecified: Secondary | ICD-10-CM | POA: Diagnosis not present

## 2016-07-29 DIAGNOSIS — F329 Major depressive disorder, single episode, unspecified: Secondary | ICD-10-CM | POA: Diagnosis not present

## 2016-07-29 DIAGNOSIS — Z7902 Long term (current) use of antithrombotics/antiplatelets: Secondary | ICD-10-CM | POA: Diagnosis not present

## 2016-07-29 DIAGNOSIS — Z9049 Acquired absence of other specified parts of digestive tract: Secondary | ICD-10-CM | POA: Diagnosis not present

## 2016-07-29 DIAGNOSIS — Z7982 Long term (current) use of aspirin: Secondary | ICD-10-CM | POA: Diagnosis not present

## 2016-07-29 NOTE — ED Triage Notes (Signed)
Patient arrives with complaint of upper abdominal pain. States onset of Monday. Was seen yesterday at [facility] for the same. At that time was prescribed a few things to take, but was only able to get the miralax. States pain is worse now and she still has not had a bowel movement. Endorses some nausea and small amount of emesis.

## 2016-07-30 ENCOUNTER — Encounter (HOSPITAL_COMMUNITY): Payer: Self-pay

## 2016-07-30 ENCOUNTER — Emergency Department (HOSPITAL_COMMUNITY): Payer: Medicaid Other

## 2016-07-30 LAB — CBC
HCT: 42.8 % (ref 36.0–46.0)
Hemoglobin: 14.2 g/dL (ref 12.0–15.0)
MCH: 27.7 pg (ref 26.0–34.0)
MCHC: 33.2 g/dL (ref 30.0–36.0)
MCV: 83.4 fL (ref 78.0–100.0)
PLATELETS: 316 10*3/uL (ref 150–400)
RBC: 5.13 MIL/uL — AB (ref 3.87–5.11)
RDW: 13.6 % (ref 11.5–15.5)
WBC: 11.1 10*3/uL — ABNORMAL HIGH (ref 4.0–10.5)

## 2016-07-30 LAB — URINALYSIS, ROUTINE W REFLEX MICROSCOPIC
Bilirubin Urine: NEGATIVE
Ketones, ur: NEGATIVE mg/dL
Leukocytes, UA: NEGATIVE
Nitrite: NEGATIVE
PROTEIN: NEGATIVE mg/dL
SPECIFIC GRAVITY, URINE: 1.017 (ref 1.005–1.030)
pH: 7 (ref 5.0–8.0)

## 2016-07-30 LAB — COMPREHENSIVE METABOLIC PANEL
ALBUMIN: 3.3 g/dL — AB (ref 3.5–5.0)
ALK PHOS: 143 U/L — AB (ref 38–126)
ALT: 18 U/L (ref 14–54)
AST: 18 U/L (ref 15–41)
Anion gap: 8 (ref 5–15)
BUN: 6 mg/dL (ref 6–20)
CALCIUM: 9.4 mg/dL (ref 8.9–10.3)
CHLORIDE: 96 mmol/L — AB (ref 101–111)
CO2: 28 mmol/L (ref 22–32)
CREATININE: 0.84 mg/dL (ref 0.44–1.00)
GFR calc Af Amer: >60 mL/min (ref >60)
GFR calc non Af Amer: >60 mL/min (ref >60)
GLUCOSE: 378 mg/dL — AB (ref 65–99)
Potassium: 3.5 mmol/L (ref 3.5–5.1)
SODIUM: 132 mmol/L — AB (ref 135–145)
Total Bilirubin: 0.5 mg/dL (ref 0.3–1.2)
Total Protein: 7.7 g/dL (ref 6.5–8.1)

## 2016-07-30 LAB — LIPASE, BLOOD: LIPASE: 80 U/L — AB (ref 11–51)

## 2016-07-30 MED ORDER — ONDANSETRON HCL 4 MG/2ML IJ SOLN
4.0000 mg | Freq: Once | INTRAMUSCULAR | Status: AC
  Administered 2016-07-30: 4 mg via INTRAVENOUS
  Filled 2016-07-30: qty 2

## 2016-07-30 MED ORDER — SODIUM CHLORIDE 0.9 % IV BOLUS (SEPSIS)
1000.0000 mL | Freq: Once | INTRAVENOUS | Status: AC
  Administered 2016-07-30: 1000 mL via INTRAVENOUS

## 2016-07-30 MED ORDER — MORPHINE SULFATE (PF) 4 MG/ML IV SOLN
4.0000 mg | Freq: Once | INTRAVENOUS | Status: AC
  Administered 2016-07-30: 4 mg via INTRAVENOUS
  Filled 2016-07-30: qty 1

## 2016-07-30 MED ORDER — ONDANSETRON HCL 4 MG PO TABS: 4.0000 mg | ORAL_TABLET | Freq: Four times a day (QID) | ORAL | 0 refills | Status: DC

## 2016-07-30 MED ORDER — IOPAMIDOL (ISOVUE-300) INJECTION 61%
INTRAVENOUS | Status: AC
  Administered 2016-07-30: 100 mL
  Filled 2016-07-30: qty 100

## 2016-07-30 MED ORDER — HYDROCODONE-ACETAMINOPHEN 5-325 MG PO TABS: 1.0000 | ORAL_TABLET | Freq: Four times a day (QID) | ORAL | 0 refills | Status: DC | PRN

## 2016-07-30 NOTE — ED Notes (Signed)
Patient transported to CT 

## 2016-07-30 NOTE — ED Provider Notes (Signed)
[facility] DEPT Provider Note   CSN: [id number] Arrival date & time: 07/29/16  2338     History   Chief Complaint Chief Complaint  Patient presents with  . Abdominal Pain    HPI Tara Keller is a 51 y.o. female.  Patient presents to the emergency department with chief complaint of upper abdominal pain. She states the pain started on Monday. It has been gradually worsening. She reports that she has had associated nausea, one episode of vomiting, and has also been constipated. She has not taken anything for symptoms. She was seen earlier today at urgent care, and prescribed MiraLAX for constipation. She has not been able to start taking this yet. She denies any fevers or chills. She denies any alcohol use. She has surgical history remarkable for prior cholecystectomy.   The history is provided by the patient. No language interpreter was used.    Past Medical History:  Diagnosis Date  . Anxiety   . Arthritis   . Asthma   . Depression   . Diabetes mellitus   . Fatty liver   . Fracture closed of upper end of forearm 9 years, Fx left left leg  . Fracture of left lower leg @ 51 years old  . H/O tubal ligation   . Hiatal hernia   . Hypertension   . Obesity   . Positive H. pylori test   . Sleep apnea    uses CPAP  . Tubular adenoma of colon     Patient Active Problem List   Diagnosis Date Noted  . OSA on CPAP 07/06/2015  . Depression (emotion) 07/06/2015  . Abnormal barium swallow   . Abdominal pain, epigastric 08/15/2013  . Benign neoplasm of colon 08/15/2013  . Special screening for malignant neoplasms, colon 08/15/2013  . Hepatic steatosis 05/19/2013  . History of pancreatitis 05/19/2013  . Family history of colon cancer 05/19/2013  . GERD (gastroesophageal reflux disease) 02/18/2013  . Smoking 02/18/2013  . Morbid obesity ([place]) 05/28/2012  . Type 2 diabetes mellitus ([place]) 05/28/2012  . Osteoarthritis of left and right knee 05/28/2012  . Generalized anxiety  disorder 05/28/2012    Past Surgical History:  Procedure Laterality Date  . CHOLECYSTECTOMY    . COLONOSCOPY WITH PROPOFOL N/A 08/15/2013   Procedure: COLONOSCOPY WITH PROPOFOL;  Surgeon: Jerene Bears, MD;  Location: [facility];  Service: Gastroenterology;  Laterality: N/A;  . ESOPHAGOGASTRODUODENOSCOPY N/A 05/12/2014   Procedure: ESOPHAGOGASTRODUODENOSCOPY (EGD);  Surgeon: Jerene Bears, MD;  Location: [facility];  Service: Gastroenterology;  Laterality: N/A;  . ESOPHAGOGASTRODUODENOSCOPY (EGD) WITH PROPOFOL N/A 08/15/2013   Procedure: ESOPHAGOGASTRODUODENOSCOPY (EGD) WITH PROPOFOL;  Surgeon: Jerene Bears, MD;  Location: [facility];  Service: Gastroenterology;  Laterality: N/A;  . TUBAL LIGATION Bilateral     OB History    Gravida Para Term Preterm AB Living   3 3 2 1   4    SAB TAB Ectopic Multiple Live Births         1         Home Medications    Prior to Admission medications   Medication Sig Start Date End Date Taking? Authorizing Provider  ACCU-CHEK AVIVA PLUS test strip TEST three times a day 09/08/15   Maren Reamer, MD  ACCU-CHEK FASTCLIX LANCETS MISC Use as directed 12/22/15   Maren Reamer, MD  albuterol (PROVENTIL HFA;VENTOLIN HFA) 108 (90 Base) MCG/ACT inhaler Inhale 1-2 puffs into the lungs every 6 (six) hours as needed for wheezing or shortness  of breath. 05/16/82   Delora Fuel, MD  aspirin EC 81 MG tablet Take 1 tablet (81 mg total) by mouth daily. 04/25/16   Maren Reamer, MD  bisacodyl (DULCOLAX) 10 MG suppository Place 1 suppository (10 mg total) rectally as needed for moderate constipation. 07/28/16   Tasia Catchings, Amy V, PA-C  buPROPion (WELLBUTRIN XL) 150 MG 24 hr tablet Take 450 mg by mouth daily.    [provider]  clonazePAM (KLONOPIN) 1 MG tablet Take 1 mg by mouth at bedtime.    [provider]  docusate sodium (COLACE) 50 MG capsule Take 1 capsule (50 mg total) by mouth 2 (two) times daily. 07/28/16   Tasia Catchings, Amy V, PA-C  DULoxetine (CYMBALTA)  60 MG capsule Take 60 mg by mouth 2 (two) times daily.     [provider]  gabapentin (NEURONTIN) 100 MG capsule Take 1 capsule (100 mg total) by mouth every morning. 04/25/16   Langeland, Dawn T, MD  insulin aspart (NOVOLOG) 100 UNIT/ML FlexPen Inject 15 Units into the skin 3 (three) times daily with meals. 04/25/16   Maren Reamer, MD  Insulin Glargine (LANTUS SOLOSTAR) 100 UNIT/ML Solostar Pen Inject 45 Units into the skin daily at 10 pm. 04/25/16   Lottie Mussel T, MD  Insulin Pen Needle 31G X 8 MM MISC Use as directed for 4 times daily insulin administration. 10/08/15   Maren Reamer, MD  Insulin Syringe-Needle U-100 (BD INSULIN SYRINGE ULTRAFINE) 31G X 15/64" 0.5 ML MISC Use as directed 08/11/15   Tresa Garter, MD  INVOKANA 300 MG TABS tablet Take 1 tablet (300 mg total) by mouth daily. 01/17/16   Langeland, Leda Quail, MD  lisinopril (PRINIVIL,ZESTRIL) 5 MG tablet Take 1 tablet (5 mg total) by mouth daily. 04/25/16   Maren Reamer, MD  metFORMIN (GLUCOPHAGE XR) 500 MG 24 hr tablet Take 2 tablets (1,000 mg total) by mouth 2 (two) times daily. 04/25/16   Maren Reamer, MD  metoCLOPramide (REGLAN) 5 MG tablet Take 1 tablet (5 mg total) by mouth 3 (three) times daily before meals. 03/13/16   Pyrtle, Lajuan Lines, MD  metroNIDAZOLE (FLAGYL) 500 MG tablet Take 1 tablet (500 mg total) by mouth 2 (two) times daily. 07/03/16   Barnet Glasgow, NP  NEEDLE, DISP, 24 G (B-D DISP NEEDLE 24GX1") 24G X 1" MISC Use with solostar pen to inject 4 times daily 07/09/14   Tresa Garter, MD  nicotine (NICODERM CQ) 21 mg/24hr patch Place 1 patch (21 mg total) onto the skin daily. 04/25/16   Maren Reamer, MD  pantoprazole (PROTONIX) 40 MG tablet Take 1 tablet (40 mg total) by mouth 2 (two) times daily before a meal. 03/13/16   Pyrtle, Lajuan Lines, MD  polyethylene glycol [facility]) packet Take 17 g by mouth daily. 07/28/16   Tasia Catchings, Amy V, PA-C  pravastatin (PRAVACHOL) 20 MG tablet Take 1 tablet (20 mg  total) by mouth daily. 04/25/16   Langeland, Leda Quail, MD  zolpidem (AMBIEN) 5 MG tablet Take 5 mg by mouth at bedtime.    [provider]    Family History Family History  Problem Relation Age of Onset  . Diabetes Mother   . Stroke Mother   . Hypertension Mother   . Breast cancer Sister   . Heart attack Sister   . Other Daughter        died at age 49    Social History Social History  Substance Use Topics  .  Smoking status: Current Every Day Smoker    Packs/day: 0.25    Types: Cigarettes  . Smokeless tobacco: Never Used  . Alcohol use No     Allergies   Patient has no known allergies.   Review of Systems Review of Systems  All other systems reviewed and are negative.    Physical Exam Updated Vital Signs BP 114/88   Pulse 80   Temp 98.4 F (36.9 C) (Oral)   Resp 18   LMP 01/23/2014 (Approximate) Comment: no cycle since this date  SpO2 97%   Physical Exam  Constitutional: She is oriented to person, place, and time. She appears well-developed and well-nourished.  HENT:  Head: Normocephalic and atraumatic.  Eyes: Pupils are equal, round, and reactive to light. Conjunctivae and EOM are normal.  Neck: Normal range of motion. Neck supple.  Cardiovascular: Normal rate and regular rhythm.  Exam reveals no gallop and no friction rub.   No murmur heard. Pulmonary/Chest: Effort normal and breath sounds normal. No respiratory distress. She has no wheezes. She has no rales. She exhibits no tenderness.  Abdominal: Soft. Bowel sounds are normal. She exhibits no distension and no mass. There is tenderness. There is no rebound and no guarding.  Moderate to severe TTP of epigastric abdominal pain  Musculoskeletal: Normal range of motion. She exhibits no edema or tenderness.  Neurological: She is alert and oriented to person, place, and time.  Skin: Skin is warm and dry.  Psychiatric: She has a normal mood and affect. Her behavior is normal. Judgment and thought content  normal.  Nursing note and vitals reviewed.    ED Treatments / Results  Labs (all labs ordered are listed, but only abnormal results are displayed) Labs Reviewed  LIPASE, BLOOD - Abnormal; Notable for the following:       Result Value   Lipase 80 (*)    All other components within normal limits  COMPREHENSIVE METABOLIC PANEL - Abnormal; Notable for the following:    Sodium 132 (*)    Chloride 96 (*)    Glucose, Bld 378 (*)    Albumin 3.3 (*)    Alkaline Phosphatase 143 (*)    All other components within normal limits  CBC - Abnormal; Notable for the following:    WBC 11.1 (*)    RBC 5.13 (*)    All other components within normal limits  URINALYSIS, ROUTINE W REFLEX MICROSCOPIC - Abnormal; Notable for the following:    Glucose, UA >=500 (*)    Hgb urine dipstick MODERATE (*)    Bacteria, UA RARE (*)    Squamous Epithelial / LPF 0-5 (*)    All other components within normal limits    EKG  EKG Interpretation None       Radiology Ct Abdomen Pelvis W Contrast  Result Date: 07/30/2016 CLINICAL DATA:  Upper abdominal pain.  Nausea. EXAM: CT ABDOMEN AND PELVIS WITH CONTRAST TECHNIQUE: Multidetector CT imaging of the abdomen and pelvis was performed using the standard protocol following bolus administration of intravenous contrast. CONTRAST:  173mL ISOVUE-300 IOPAMIDOL (ISOVUE-300) INJECTION 61% COMPARISON:  CT 11/18/2013 FINDINGS: Lower chest: Unchanged 4 mm left lower lobe pulmonary nodule over the course of 3 years considered benign. No consolidation. No pleural fluid. Hepatobiliary: The liver is enlarged spanning 25 cm cranial caudal with hepatic steatosis. No evidence of focal lesion. Clips in the gallbladder fossa postcholecystectomy. No biliary dilatation. Pancreas: No ductal dilatation or inflammation. Spleen: Upper normal in size spanning 13 cm.  No  focal abnormality. Adrenals/Urinary Tract: Normal adrenal glands. No hydronephrosis or perinephric edema. Unchanged cyst in the  mid right kidney measuring 12 mm. Minimal cortical scarring in the lower right kidney unchanged. Urinary bladder is physiologically distended without wall thickening. Stomach/Bowel: Tiny hiatal hernia. Stomach is physiologically distended. No bowel obstruction or inflammation. Normal appendix. Moderate colonic stool burden without colonic wall thickening. Vascular/Lymphatic: No acute vascular abnormality. No abdominopelvic adenopathy. Reproductive: Uterus and bilateral adnexa are unremarkable. Fibroid seen on prior exam not as well demonstrated. Other: No free air, free fluid, or intra-abdominal fluid collection. Tiny fat containing umbilical hernia. Surgical clips adjacent to the umbilicus. Musculoskeletal: Mild degenerative change in the lower lumbar spine. There are no acute or suspicious osseous abnormalities. IMPRESSION: 1. No acute abnormality in the abdomen/pelvis. 2. Hepatic steatosis with hepatomegaly, may be in part contributing to upper abdominal pain. Borderline splenomegaly. Electronically Signed   By: Jeb Levering M.D.   On: 07/30/2016 02:13    Procedures Procedures (including critical care time)  Medications Ordered in ED Medications  iopamidol (ISOVUE-300) 61 % injection (not administered)  morphine 4 MG/ML injection 4 mg (4 mg Intravenous Given 07/30/16 0122)  ondansetron (ZOFRAN) injection 4 mg (4 mg Intravenous Given 07/30/16 0122)  sodium chloride 0.9 % bolus 1,000 mL (1,000 mLs Intravenous New Bag/Given 07/30/16 0121)     Initial Impression / Assessment and Plan / ED Course  I have reviewed the triage vital signs and the nursing notes.  Pertinent labs & imaging results that were available during my care of the patient were reviewed by me and considered in my medical decision making (see chart for details).     Patient with upper abdominal pain. Onset was Monday. She was seen today at urgent care and prescribed MiraLAX for constipation. She has moderate epigastric abdominal  pain on exam. She has mildly elevated lipase. History of cholecystectomy. Check CT imaging.  CT is reassuring. No acute process. Vital signs are stable. Patient. Better after fluids and medicine. Patient will follow-up with her primary care doctor. Return precautions discussed. She is stable and ready for discharge. Tolerating oral intake now.  Final Clinical Impressions(s) / ED Diagnoses   Final diagnoses:  Epigastric pain    New Prescriptions New Prescriptions   No medications on file     Delaine Lame 07/30/16 8022    Ripley Fraise, MD 07/30/16 [phone]

## 2016-08-04 ENCOUNTER — Other Ambulatory Visit (INDEPENDENT_AMBULATORY_CARE_PROVIDER_SITE_OTHER): Payer: Medicaid Other

## 2016-08-04 ENCOUNTER — Ambulatory Visit (INDEPENDENT_AMBULATORY_CARE_PROVIDER_SITE_OTHER): Payer: Medicaid Other | Admitting: Physician Assistant

## 2016-08-04 ENCOUNTER — Encounter: Payer: Self-pay | Admitting: Physician Assistant

## 2016-08-04 ENCOUNTER — Telehealth: Payer: Self-pay | Admitting: *Deleted

## 2016-08-04 VITALS — BP 126/74 | HR 84 | Ht 66.0 in | Wt 282.0 lb

## 2016-08-04 DIAGNOSIS — K5909 Other constipation: Secondary | ICD-10-CM | POA: Diagnosis not present

## 2016-08-04 DIAGNOSIS — R112 Nausea with vomiting, unspecified: Secondary | ICD-10-CM

## 2016-08-04 DIAGNOSIS — Z86010 Personal history of colonic polyps: Secondary | ICD-10-CM

## 2016-08-04 DIAGNOSIS — G8929 Other chronic pain: Secondary | ICD-10-CM

## 2016-08-04 DIAGNOSIS — R1011 Right upper quadrant pain: Secondary | ICD-10-CM

## 2016-08-04 DIAGNOSIS — K3184 Gastroparesis: Secondary | ICD-10-CM | POA: Diagnosis not present

## 2016-08-04 DIAGNOSIS — Z860101 Personal history of adenomatous and serrated colon polyps: Secondary | ICD-10-CM

## 2016-08-04 HISTORY — DX: Personal history of colonic polyps: Z86.010

## 2016-08-04 HISTORY — DX: Personal history of adenomatous and serrated colon polyps: Z86.0101

## 2016-08-04 LAB — CBC WITH DIFFERENTIAL/PLATELET
Basophils Absolute: 0.1 10*3/uL (ref 0.0–0.1)
Basophils Relative: 1 % (ref 0.0–3.0)
EOS ABS: 0.3 10*3/uL (ref 0.0–0.7)
EOS PCT: 2.9 % (ref 0.0–5.0)
HCT: 44.5 % (ref 36.0–46.0)
HEMOGLOBIN: 14.6 g/dL (ref 12.0–15.0)
Lymphocytes Relative: 44.3 % (ref 12.0–46.0)
Lymphs Abs: 4.7 10*3/uL — ABNORMAL HIGH (ref 0.7–4.0)
MCHC: 32.8 g/dL (ref 30.0–36.0)
MCV: 84.4 fl (ref 78.0–100.0)
MONO ABS: 0.8 10*3/uL (ref 0.1–1.0)
Monocytes Relative: 7.4 % (ref 3.0–12.0)
Neutro Abs: 4.7 10*3/uL (ref 1.4–7.7)
Neutrophils Relative %: 44.4 % (ref 43.0–77.0)
Platelets: 337 10*3/uL (ref 150.0–400.0)
RBC: 5.28 Mil/uL — AB (ref 3.87–5.11)
RDW: 13.5 % (ref 11.5–15.5)
WBC: 10.6 10*3/uL — AB (ref 4.0–10.5)

## 2016-08-04 LAB — COMPREHENSIVE METABOLIC PANEL
ALBUMIN: 3.8 g/dL (ref 3.5–5.2)
ALT: 21 U/L (ref 0–35)
AST: 14 U/L (ref 0–37)
Alkaline Phosphatase: 146 U/L — ABNORMAL HIGH (ref 39–117)
BUN: 6 mg/dL (ref 6–23)
CHLORIDE: 95 meq/L — AB (ref 96–112)
CO2: 33 meq/L — AB (ref 19–32)
CREATININE: 0.68 mg/dL (ref 0.40–1.20)
Calcium: 10.3 mg/dL (ref 8.4–10.5)
GFR: 116.4 mL/min (ref >60.00)
Glucose, Bld: 289 mg/dL — ABNORMAL HIGH (ref 70–99)
Potassium: 4 mEq/L (ref 3.5–5.1)
SODIUM: 136 meq/L (ref 135–145)
Total Bilirubin: 0.5 mg/dL (ref 0.2–1.2)
Total Protein: 8.1 g/dL (ref 6.0–8.3)

## 2016-08-04 LAB — HEMOGLOBIN A1C: Hgb A1c MFr Bld: 13.9 % — ABNORMAL HIGH (ref 4.6–6.5)

## 2016-08-04 LAB — LIPASE: Lipase: 60 U/L — ABNORMAL HIGH (ref 11.0–59.0)

## 2016-08-04 MED ORDER — METOCLOPRAMIDE HCL 5 MG PO TABS: ORAL_TABLET | ORAL | 0 refills | Status: DC

## 2016-08-04 MED ORDER — ONDANSETRON 4 MG PO TBDP: ORAL_TABLET | ORAL | 0 refills | Status: DC

## 2016-08-04 MED ORDER — TRAMADOL HCL 50 MG PO TABS: ORAL_TABLET | ORAL | 0 refills | Status: DC

## 2016-08-04 MED ORDER — PANTOPRAZOLE SODIUM 40 MG PO TBEC: 40.0000 mg | DELAYED_RELEASE_TABLET | Freq: Two times a day (BID) | ORAL | 6 refills | Status: DC

## 2016-08-04 NOTE — Progress Notes (Addendum)
Subjective:    Patient ID: Tara Keller, female    DOB: 1965/08/16, 51 y.o.   MRN: [id number]  HPI Tara Keller is a 51 year old African-American female known to Dr. Hilarie Fredrickson who was last seen in our office in March 2018. She has multiple medical issues including morbid obesity, sleep apnea, chronic GERD, hepatic steatosis, insulin-dependent diabetes mellitus, chronic anxiety and hyperlipidemia. She comes in today after ER visit on 07/29/2016 for upper abdominal pain nausea and vomiting. She is status post cholecystectomy. ER evaluation included CT of the abdomen and pelvis with contrast which showed hepatomegaly consistent with hepatic steatosis and spleen noted to be upper limits of normal at 13 cm pancreas unremarkable no ductal dilation and no other acute findings. Labs at that time were pertinent for a glucose of 378 lipase of 80, LFTs normal with the exception of an alkaline phosphatase of 143 indices patient has had mild chronic elevation). She was discharged home with a short course of Zofran and metoclopramide. She says because these are helpful but she has run out. Patient says her symptoms started about 2 weeks ago with constipation and rather generalized abdominal discomfort. Symptoms have persisted, and she states she was having frequent vomiting postprandially and difficulty keeping any food down. She says since the ER visit after taking MiraLAX for a few days her bowel movements have been more regular but she continues to have ongoing abdominal discomfort especially in the upper abdomen nausea postprandially and some vomiting after about every meal. She has continued to take insulin but says she hasn't been able to check her blood sugars because her meter is not functioning. No regular aspirin or NSAID use. Previous colonoscopy August 2015 with 2 small polyps removed both of which were tubular adenomas and EGD in 2016 showed a 3 cm hiatal hernia and distal esophagitis she was H. pylori positive  and treated.   Review of Systems Pertinent positive and negative review of systems were noted in the above HPI section.  All other review of systems was otherwise negative.  Outpatient Encounter Prescriptions as of 08/04/2016  Medication Sig  . ACCU-CHEK AVIVA PLUS test strip TEST three times a day  . ACCU-CHEK FASTCLIX LANCETS MISC Use as directed  . albuterol (PROVENTIL HFA;VENTOLIN HFA) 108 (90 Base) MCG/ACT inhaler Inhale 1-2 puffs into the lungs every 6 (six) hours as needed for wheezing or shortness of breath.  [name] aspirin EC 81 MG tablet Take 1 tablet (81 mg total) by mouth daily.  . bisacodyl (DULCOLAX) 10 MG suppository Place 1 suppository (10 mg total) rectally as needed for moderate constipation.  [name] buPROPion (WELLBUTRIN XL) 150 MG 24 hr tablet Take 450 mg by mouth daily.  . clonazePAM (KLONOPIN) 1 MG tablet Take 1 mg by mouth at bedtime.  . docusate sodium (COLACE) 50 MG capsule Take 1 capsule (50 mg total) by mouth 2 (two) times daily.  . DULoxetine (CYMBALTA) 60 MG capsule Take 60 mg by mouth 2 (two) times daily.   [name] gabapentin (NEURONTIN) 100 MG capsule Take 1 capsule (100 mg total) by mouth every morning.  . insulin aspart (NOVOLOG) 100 UNIT/ML FlexPen Inject 15 Units into the skin 3 (three) times daily with meals.  . Insulin Glargine (LANTUS SOLOSTAR) 100 UNIT/ML Solostar Pen Inject 45 Units into the skin daily at 10 pm.  . Insulin Pen Needle 31G X 8 MM MISC Use as directed for 4 times daily insulin administration.  . Insulin Syringe-Needle U-100 (BD INSULIN SYRINGE ULTRAFINE) 31G X 15/64"  0.5 ML MISC Use as directed  . INVOKANA 300 MG TABS tablet Take 1 tablet (300 mg total) by mouth daily.  [name] lisinopril (PRINIVIL,ZESTRIL) 5 MG tablet Take 1 tablet (5 mg total) by mouth daily.  . metFORMIN (GLUCOPHAGE XR) 500 MG 24 hr tablet Take 2 tablets (1,000 mg total) by mouth 2 (two) times daily.  . metoCLOPramide (REGLAN) 5 MG tablet Take 1/2 tablet before a meal.  . NEEDLE, DISP, 24 G  (B-D DISP NEEDLE 24GX1") 24G X 1" MISC Use with solostar pen to inject 4 times daily  . nicotine (NICODERM CQ) 21 mg/24hr patch Place 1 patch (21 mg total) onto the skin daily.  . ondansetron (ZOFRAN) 4 MG tablet Take 1 tablet (4 mg total) by mouth every 6 (six) hours.  . pantoprazole (PROTONIX) 40 MG tablet Take 1 tablet (40 mg total) by mouth 2 (two) times daily.  . polyethylene glycol (MIRALAX) packet Take 17 g by mouth daily.  . pravastatin (PRAVACHOL) 20 MG tablet Take 1 tablet (20 mg total) by mouth daily.  [name] zolpidem (AMBIEN) 5 MG tablet Take 5 mg by mouth at bedtime.  . [DISCONTINUED] metoCLOPramide (REGLAN) 5 MG tablet Take 1 tablet (5 mg total) by mouth 3 (three) times daily before meals.  . [DISCONTINUED] pantoprazole (PROTONIX) 40 MG tablet Take 1 tablet (40 mg total) by mouth 2 (two) times daily before a meal.  . ondansetron (ZOFRAN ODT) 4 MG disintegrating tablet Take 1 tab, dissolve on the tongue every 6 hours as needed for nausea.  . traMADol (ULTRAM) 50 MG tablet Take 1 tab every 8 hours as needed for pain.  . [DISCONTINUED] HYDROcodone-acetaminophen (NORCO/VICODIN) 5-325 MG tablet Take 1 tablet by mouth every 6 (six) hours as needed.  . [DISCONTINUED] metroNIDAZOLE (FLAGYL) 500 MG tablet Take 1 tablet (500 mg total) by mouth 2 (two) times daily.   Facility-Administered Encounter Medications as of 08/04/2016  Medication  . insulin aspart (novoLOG) injection 20 Units   No Known Allergies Patient Active Problem List   Diagnosis Date Noted  . OSA on CPAP 07/06/2015  . Depression (emotion) 07/06/2015  . Abnormal barium swallow   . Abdominal pain, epigastric 08/15/2013  . Benign neoplasm of colon 08/15/2013  . Special screening for malignant neoplasms, colon 08/15/2013  . Hepatic steatosis 05/19/2013  . History of pancreatitis 05/19/2013  . Family history of colon cancer 05/19/2013  . GERD (gastroesophageal reflux disease) 02/18/2013  . Smoking 02/18/2013  . Morbid obesity  ([place]) 05/28/2012  . Type 2 diabetes mellitus ([place]) 05/28/2012  . Osteoarthritis of left and right knee 05/28/2012  . Generalized anxiety disorder 05/28/2012   Social History   Social History  . Marital status: Single    Spouse name: N/A  . Number of children: 4  . Years of education: N/A   Occupational History  . disabled    Social History Main Topics  . Smoking status: Former Smoker    Packs/day: 0.25    Types: Cigarettes    Quit date: 08/01/2016  . Smokeless tobacco: Never Used  . Alcohol use No  . Drug use: No  . Sexual activity: Not on file   Other Topics Concern  . Not on file   Social History Narrative   She lives with fiance.  Her daughter died in 05/07/2015 at the age of 81.   She is on disability since 1990/05/07   Highest level of education:  Working on GED    Ms. Buice's family history includes Breast cancer  in her sister; Cirrhosis in her father; Colon cancer in her maternal uncle; Diabetes in her mother; Heart attack in her sister; Hypertension in her mother; Other in her daughter; Stroke in her mother.      Objective:    Vitals:   08/04/16 1122  BP: 126/74  Pulse: 84    Physical Exam Well-developed morbidly obese African-American female in no acute distress, very pleasant blood pressure 126/74 pulse 84, height 5 foot 6, weight 282, BMI 45.2. HEENT ;nontraumatic normocephalic EOMI PERRLA sclera anicteric, Cardiovascular ;regular rate and rhythm with S1-S2 no murmur rub or gallop, Abdomen ;morbidly obese, bowel sounds are present she is tender rather diffusely across the upper abdomen no palpable mass or definite hepatomegaly appreciable, Rectal; exam not done, Extremities; no clubbing cyanosis or edema skin warm and dry, Neuropsych ;mood and affect appropriate      Assessment & Plan:   #46 51 year old morbidly obese African-American female with 2-3 week history of persistent upper abdominal pain, nausea and postprandial vomiting. Suspect symptoms are secondary to  diabetic gastroparesis. Her blood sugars have been very poorly controlled recently. Patient is status post cholecystectomy and CT scan of the abdomen on 07/30/2016 showed no other acute problems to account for her symptoms. #2 chronic GERD #3  significant hepatic steatosis with hepatomegaly and now with upper limits normal spleen-question early cirrhosis #4 insulin-dependent diabetes mellitus with poor control-patient unable to check her blood sugars as does not have a functioning meter #5 obstructive sleep apnea  #6 history of tubular adenomatous colon polyps due for follow-up colonoscopy August 2020 #7 history of H. pylori gastropathy treated 2016  Plan; Start gastroparesis diet step 2 and asked her to stay on a liquid to full liquid diet until she's not having any vomiting and then advanced to step 3 gastroparesis diet Will schedule for gastric emptying scan to document as this is not been done in the past Start Zofran ODT around-the-clock. Patient asked to take 4 mg ODT every morning before breakfast and then every 6-8 hours around the clock until symptoms have resolved She did have good response to metoclopramide which was given short-term after ER visit we will restart Reglan 5 mg one half hour before meals short-term. We did discuss potential neurologic side effects and she is advised to stop should she have any adverse symptoms Continue Protonix 40 mg by mouth twice a day Ultram 50 mg every 6-8 hours when necessary for severe pain only #15 and no refills and was advised we would not continue as this will not help gastroparesis CBC today, CMET, repeat lipase and check hemoglobin A1c Patient advised to get appointment with  Her PCP/internal medicine clinic at [facility] next week for help with diabetic management Follow-up with Dr. Hilarie Fredrickson or myself in 4-5 weeks or sooner if needed.  Amy S Esterwood PA-C 08/04/2016   Cc: Maren Reamer, MD   Addendum: Reviewed and agree  with initial management. Pyrtle, Lajuan Lines, MD

## 2016-08-04 NOTE — Patient Instructions (Addendum)
Please go to the basement level to have your labs drawn.  On the Gastroparesis diet, do step 2 for a few days before going on to step 3.     Get an appointment with your primary care office today.   We have sent the following medications to your pharmacy for you to pick up at your convenience: [place].  1. Zofran 4 dissolves on the tongue.  2. Protonix 40 mg , take 1 tab twice daily 3. Reglan 5 mg take 1/2 tablet with meals. We have given you a script to take to your pharmacy for Ultram for pain.  You have been scheduled for a gastric emptying scan at [facility] on 08-14-2016 at 7:30 am Please arrive at least 15 minutes prior to your appointment for registration. Please make certain not to have anything to eat or drink after midnight the night before your test. Hold all stomach medications (ex: Zofran, phenergan, Reglan) 48 hours prior to your test. If you need to reschedule your appointment, please contact radiology scheduling at [phone]. _____________________________________________________________________ A gastric-emptying study measures how long it takes for food to move through your stomach. There are several ways to measure stomach emptying. In the most common test, you eat food that contains a small amount of radioactive material. A scanner that detects the movement of the radioactive material is placed over your abdomen to monitor the rate at which food leaves your stomach. This test normally takes about 4 hours to complete. _____________________________________________________________________ We made you an appointment with DrHilarie Fredrickson for 10-1 Monday at 10:30 am.

## 2016-08-04 NOTE — Telephone Encounter (Signed)
LM for the patient advising her of the Gastric Emptying scan on Monday 08-14-2016. She is to arrive at 7:15 am to [facility]. I advised her to hold her Zofran and Reglan 8 hours before the test.  Also have nothing to eat or drink after midnight.  I asked her to call me to let me know she got my message.

## 2016-08-08 ENCOUNTER — Other Ambulatory Visit: Payer: Self-pay

## 2016-08-10 ENCOUNTER — Encounter: Payer: Self-pay | Admitting: Physician Assistant

## 2016-08-10 ENCOUNTER — Ambulatory Visit: Payer: Medicaid Other | Attending: Internal Medicine | Admitting: Physician Assistant

## 2016-08-10 ENCOUNTER — Telehealth: Payer: Self-pay | Admitting: Internal Medicine

## 2016-08-10 VITALS — BP 100/65 | HR 81 | Temp 97.9°F | Resp 18 | Ht 66.0 in | Wt 285.0 lb

## 2016-08-10 DIAGNOSIS — Z794 Long term (current) use of insulin: Secondary | ICD-10-CM | POA: Diagnosis not present

## 2016-08-10 DIAGNOSIS — K760 Fatty (change of) liver, not elsewhere classified: Secondary | ICD-10-CM | POA: Insufficient documentation

## 2016-08-10 DIAGNOSIS — Z9049 Acquired absence of other specified parts of digestive tract: Secondary | ICD-10-CM | POA: Diagnosis not present

## 2016-08-10 DIAGNOSIS — F329 Major depressive disorder, single episode, unspecified: Secondary | ICD-10-CM | POA: Diagnosis not present

## 2016-08-10 DIAGNOSIS — E1143 Type 2 diabetes mellitus with diabetic autonomic (poly)neuropathy: Secondary | ICD-10-CM | POA: Insufficient documentation

## 2016-08-10 DIAGNOSIS — I10 Essential (primary) hypertension: Secondary | ICD-10-CM | POA: Insufficient documentation

## 2016-08-10 DIAGNOSIS — Z9119 Patient's noncompliance with other medical treatment and regimen: Secondary | ICD-10-CM | POA: Insufficient documentation

## 2016-08-10 DIAGNOSIS — K449 Diaphragmatic hernia without obstruction or gangrene: Secondary | ICD-10-CM | POA: Insufficient documentation

## 2016-08-10 DIAGNOSIS — J45909 Unspecified asthma, uncomplicated: Secondary | ICD-10-CM | POA: Insufficient documentation

## 2016-08-10 DIAGNOSIS — Z9851 Tubal ligation status: Secondary | ICD-10-CM | POA: Insufficient documentation

## 2016-08-10 DIAGNOSIS — E1169 Type 2 diabetes mellitus with other specified complication: Secondary | ICD-10-CM | POA: Insufficient documentation

## 2016-08-10 DIAGNOSIS — E1165 Type 2 diabetes mellitus with hyperglycemia: Secondary | ICD-10-CM | POA: Insufficient documentation

## 2016-08-10 DIAGNOSIS — K219 Gastro-esophageal reflux disease without esophagitis: Secondary | ICD-10-CM | POA: Insufficient documentation

## 2016-08-10 DIAGNOSIS — K3184 Gastroparesis: Secondary | ICD-10-CM | POA: Diagnosis not present

## 2016-08-10 DIAGNOSIS — F419 Anxiety disorder, unspecified: Secondary | ICD-10-CM | POA: Diagnosis not present

## 2016-08-10 DIAGNOSIS — G4733 Obstructive sleep apnea (adult) (pediatric): Secondary | ICD-10-CM | POA: Insufficient documentation

## 2016-08-10 DIAGNOSIS — R1013 Epigastric pain: Secondary | ICD-10-CM | POA: Diagnosis not present

## 2016-08-10 DIAGNOSIS — Z7982 Long term (current) use of aspirin: Secondary | ICD-10-CM | POA: Diagnosis not present

## 2016-08-10 LAB — POCT URINALYSIS DIPSTICK
BILIRUBIN UA: NEGATIVE
GLUCOSE UA: 500
KETONES UA: NEGATIVE
Leukocytes, UA: NEGATIVE
Nitrite, UA: NEGATIVE
PH UA: 7 (ref 5.0–8.0)
Protein, UA: NEGATIVE
SPEC GRAV UA: 1.015 (ref 1.010–1.025)
Urobilinogen, UA: 0.2 E.U./dL

## 2016-08-10 LAB — GLUCOSE, POCT (MANUAL RESULT ENTRY)
POC Glucose: 314 mg/dl — AB (ref 70–99)
POC Glucose: 284 mg/dl — AB (ref 70–99)

## 2016-08-10 MED ORDER — INSULIN ASPART 100 UNIT/ML ~~LOC~~ SOLN
20.0000 [IU] | Freq: Once | SUBCUTANEOUS | Status: AC
  Administered 2016-08-10: 20 [IU] via SUBCUTANEOUS

## 2016-08-10 MED ORDER — INSULIN GLARGINE 100 UNIT/ML SOLOSTAR PEN: 45.0000 [IU] | PEN_INJECTOR | Freq: Every day | SUBCUTANEOUS | 2 refills | Status: DC

## 2016-08-10 MED ORDER — INVOKANA 300 MG PO TABS: 300.0000 mg | ORAL_TABLET | Freq: Every day | ORAL | 3 refills | Status: DC

## 2016-08-10 MED ORDER — ACCU-CHEK AVIVA PLUS W/DEVICE KIT: 1.0000 | PACK | Freq: Three times a day (TID) | 0 refills | Status: DC

## 2016-08-10 MED ORDER — METFORMIN HCL ER 500 MG PO TB24: 1000.0000 mg | ORAL_TABLET | Freq: Two times a day (BID) | ORAL | 2 refills | Status: DC

## 2016-08-10 NOTE — Telephone Encounter (Signed)
ACCU-CHECK meter was sent to the pharmacy.

## 2016-08-10 NOTE — Progress Notes (Signed)
Patient ID: Tara Keller, female   DOB: September 11, 1965, 51 y.o.   MRN: [id number]     Anginette Keller, is a 51 y.o. female  [id number]:[id number]  [id number]:[id number]  DOB - February 28, 1965  Subjective:  Chief Complaint and HPI: Tara Keller is a 51 y.o. female here today for a follow up visitAfter being seen in the ED 7/20 for constipation/abdominal pain and 07/29/2016 for epigastric pain then advised to f/up with GI on 08/04/2016.  She also has other multiple ED visits prior to that. She was treated with colace/miralax, zofran/metoclopramide/CT scan was reassuring (and unremarkable other than known fatty liver) in 2 respective ED visits mentioned above.  GI evaluated for the above in addition to post-prandial vomiting. Previous colonoscopy August 2015 with 2 small polyps removed both of which were tubular adenomas and EGD in 2016 showed a 3 cm hiatal hernia and distal esophagitis she was H. pylori positive and treated.  She is here today mostly to address poorly controlled DM. She has not been checking her blood sugars.  She needs a new meter.  She also admits to me that she hasn't been taking her diabetes medications except about 20% of the time.  Other than the stomach issues  She has been having, she denies any complaints today  Per GI note: #60 51 year old morbidly obese African-American female with 2-3 week history of persistent upper abdominal pain, nausea and postprandial vomiting. Suspect symptoms are secondary to diabetic gastroparesis. Her blood sugars have been very poorly controlled recently. Patient is status post cholecystectomy and CT scan of the abdomen on 07/30/2016 showed no other acute problems to account for her symptoms. #2 chronic GERD #3  significant hepatic steatosis with hepatomegaly and now with upper limits normal spleen-question early cirrhosis #4 insulin-dependent diabetes mellitus with poor control-patient unable to check her blood sugars as does not have a functioning meter #5 obstructive  sleep apnea  #6 history of tubular adenomatous colon polyps due for follow-up colonoscopy August 2020 #7 history of H. pylori gastropathy treated 2016  Plan; Start gastroparesis diet step 2 and asked her to stay on a liquid to full liquid diet until she's not having any vomiting and then advanced to step 3 gastroparesis diet Will schedule for gastric emptying scan to document as this is not been done in the past Start Zofran ODT around-the-clock. Patient asked to take 4 mg ODT every morning before breakfast and then every 6-8 hours around the clock until symptoms have resolved She did have good response to metoclopramide which was given short-term after ER visit we will restart Reglan 5 mg one half hour before meals short-term. We did discuss potential neurologic side effects and she is advised to stop should she have any adverse symptoms Continue Protonix 40 mg by mouth twice a day Ultram 50 mg every 6-8 hours when necessary for severe pain only #15 and no refills and was advised we would not continue as this will not help gastroparesis CBC today, CMET, repeat lipase and check hemoglobin A1c Patient advised to get appointment with  Her PCP/internal medicine clinic at [facility] next week for help with diabetic management Follow-up with Dr. Hilarie Fredrickson or myself in 4-5 weeks or sooner if needed.  A1C=13.9, lipase=60, alk phos=146(with some chronic elevation) .   ED/Hospital notes reviewed.    ROS:   Constitutional:  No f/c, No night sweats, No unexplained weight loss. EENT:  No vision changes, No blurry vision, No hearing changes. No mouth, throat, or ear problems.  Respiratory: No cough, No SOB Cardiac: No CP, no palpitations GI:  + abd pain, + N/V/C. GU: No Urinary s/sx Musculoskeletal: No joint pain Neuro: No headache, no dizziness, no motor weakness.  Skin: No rash Endocrine:  No polydipsia. No polyuria.  Psych: Denies SI/HI  No problems updated.  ALLERGIES: No Known  Allergies  PAST MEDICAL HISTORY: Past Medical History:  Diagnosis Date  . Anxiety   . Arthritis   . Asthma   . Depression   . Diabetes mellitus   . Fatty liver   . Fracture closed of upper end of forearm 9 years, Fx left left leg  . Fracture of left lower leg @ 51 years old  . H/O tubal ligation   . Hiatal hernia   . Hypertension   . Obesity   . Positive H. pylori test   . Sleep apnea    uses CPAP  . Tubular adenoma of colon     MEDICATIONS AT HOME: Prior to Admission medications   Medication Sig Start Date End Date Taking? Authorizing Provider  ACCU-CHEK AVIVA PLUS test strip TEST three times a day 09/08/15   Maren Reamer, MD  ACCU-CHEK FASTCLIX LANCETS MISC Use as directed 12/22/15   Maren Reamer, MD  albuterol (PROVENTIL HFA;VENTOLIN HFA) 108 (90 Base) MCG/ACT inhaler Inhale 1-2 puffs into the lungs every 6 (six) hours as needed for wheezing or shortness of breath. 05/18/54   Delora Fuel, MD  aspirin EC 81 MG tablet Take 1 tablet (81 mg total) by mouth daily. 04/25/16   Maren Reamer, MD  bisacodyl (DULCOLAX) 10 MG suppository Place 1 suppository (10 mg total) rectally as needed for moderate constipation. 07/28/16   Tasia Catchings, Amy V, PA-C  buPROPion (WELLBUTRIN XL) 150 MG 24 hr tablet Take 450 mg by mouth daily.    [provider]  clonazePAM (KLONOPIN) 1 MG tablet Take 1 mg by mouth at bedtime.    [provider]  docusate sodium (COLACE) 50 MG capsule Take 1 capsule (50 mg total) by mouth 2 (two) times daily. 07/28/16   Tasia Catchings, Amy V, PA-C  DULoxetine (CYMBALTA) 60 MG capsule Take 60 mg by mouth 2 (two) times daily.     [provider]  gabapentin (NEURONTIN) 100 MG capsule Take 1 capsule (100 mg total) by mouth every morning. 04/25/16   Langeland, Dawn T, MD  insulin aspart (NOVOLOG) 100 UNIT/ML FlexPen Inject 15 Units into the skin 3 (three) times daily with meals. 04/25/16   Maren Reamer, MD  Insulin Glargine (LANTUS SOLOSTAR) 100 UNIT/ML  Solostar Pen Inject 45 Units into the skin daily at 10 pm. 08/10/16   Argentina Donovan, PA-C  Insulin Pen Needle 31G X 8 MM MISC Use as directed for 4 times daily insulin administration. 10/08/15   Maren Reamer, MD  Insulin Syringe-Needle U-100 (BD INSULIN SYRINGE ULTRAFINE) 31G X 15/64" 0.5 ML MISC Use as directed 08/11/15   Tresa Garter, MD  INVOKANA 300 MG TABS tablet Take 1 tablet (300 mg total) by mouth daily. 08/10/16   Argentina Donovan, PA-C  lisinopril (PRINIVIL,ZESTRIL) 5 MG tablet Take 1 tablet (5 mg total) by mouth daily. 04/25/16   Maren Reamer, MD  metFORMIN (GLUCOPHAGE XR) 500 MG 24 hr tablet Take 2 tablets (1,000 mg total) by mouth 2 (two) times daily. 08/10/16   Argentina Donovan, PA-C  metoCLOPramide (REGLAN) 5 MG tablet Take 1/2 tablet before a meal. 08/04/16   Esterwood, Amy S, PA-C  NEEDLE, DISP, 24 G (B-D DISP NEEDLE 24GX1") 24G X 1" MISC Use with solostar pen to inject 4 times daily 07/09/14   Angelica Chessman E, MD  nicotine (NICODERM CQ) 21 mg/24hr patch Place 1 patch (21 mg total) onto the skin daily. 04/25/16   Langeland, Dawn T, MD  ondansetron (ZOFRAN ODT) 4 MG disintegrating tablet Take 1 tab, dissolve on the tongue every 6 hours as needed for nausea. 08/04/16   Esterwood, Amy S, PA-C  ondansetron (ZOFRAN) 4 MG tablet Take 1 tablet (4 mg total) by mouth every 6 (six) hours. 07/30/16   Montine Circle, PA-C  pantoprazole (PROTONIX) 40 MG tablet Take 1 tablet (40 mg total) by mouth 2 (two) times daily. 08/04/16   Esterwood, Amy S, PA-C  polyethylene glycol (MIRALAX) packet Take 17 g by mouth daily. 07/28/16   Tasia Catchings, Amy V, PA-C  pravastatin (PRAVACHOL) 20 MG tablet Take 1 tablet (20 mg total) by mouth daily. 04/25/16   Maren Reamer, MD  traMADol (ULTRAM) 50 MG tablet Take 1 tab every 8 hours as needed for pain. 08/04/16   Esterwood, Amy S, PA-C  zolpidem (AMBIEN) 5 MG tablet Take 5 mg by mouth at bedtime.    [provider]     Objective:  EXAM:    Vitals:   08/10/16 1049  BP: 100/65  Pulse: 81  Resp: 18  Temp: 97.9 F (36.6 C)  TempSrc: Oral  SpO2: 98%  Weight: 285 lb (129.3 kg)  Height: 5' 6"  (1.676 m)    General appearance : A&OX3. NAD. Non-toxic-appearing HEENT: Atraumatic and Normocephalic.  PERRLA. EOM intact.  TM clear B. Mouth-MMM, post pharynx WNL w/o erythema, No PND. Neck: supple, no JVD. No cervical lymphadenopathy. No thyromegaly Chest/Lungs:  Breathing-non-labored, Good air entry bilaterally, breath sounds normal without rales, rhonchi, or wheezing  CVS: S1 S2 regular, no murmurs, gallops, rubs  Abdomen: Bowel sounds present, Non tender and not distended with no gaurding, rigidity or rebound. Extremities: Bilateral Lower Ext shows no edema, both legs are warm to touch with = pulse throughout Neurology:  CN II-XII grossly intact, Non focal.   Psych:  TP linear. J/I WNL. Normal speech. Appropriate eye contact and affect.  Skin:  No Rash  Data Review Lab Results  Component Value Date   HGBA1C 13.9 (H) 08/04/2016   HGBA1C 10.3 04/25/2016   HGBA1C 9.4 01/17/2016     Assessment & Plan   1. Type 2 diabetes mellitus with other specified complication, without long-term current use of insulin (HCC) Uncontrolled but also only taking meds ~20% of the time. Non-compliance. For now, hold your Novolog unless your blood sugar is above 300. Restart your Lantus at 45 units at bedtime and restart metformin and Invokana. Check blood sugars 3 times daily and record and bring to next visit. - Glucose (CBG) - Urinalysis Dipstick - insulin aspart (novoLOG) injection 20 Units; Inject 0.2 mLs (20 Units total) into the skin once in office Resume- INVOKANA 300 MG TABS tablet; Take 1 tablet (300 mg total) by mouth daily.  Dispense: 90 tablet; Refill: 3 - Insulin Glargine (LANTUS SOLOSTAR) 100 UNIT/ML Solostar Pen; Inject 45 Units into the skin daily at 10 pm.  Dispense: 5 pen; Refill: 2 - metFORMIN (GLUCOPHAGE XR) 500 MG 24  hr tablet; Take 2 tablets (1,000 mg total) by mouth 2 (two) times daily.  Dispense: 120 tablet; Refill: 2  2. Abdominal pain, epigastric Continue with work-up with GI.  Her GI issues may improve as her blood  sugar becomes better controlled as some gastroparesis is likely a factor.  Patient have been counseled extensively about nutrition and exercise Spent >25mns face to face obtaining history and finding out that she actually hasn't been taking her meds very often the counseling on the need for compliance and diabetic diet at length in addition to time spent reviewing recent ED/GI notes.    Return in about 2 weeks (around 08/24/2016) for with STheda Sersfor DM medication management; reassign new PCP and schedule appt 1 month.  The patient was given clear instructions to go to ER or return to medical center if symptoms don't improve, worsen or new problems develop. The patient verbalized understanding. The patient was told to call to get lab results if they haven't heard anything in the next week.     AFreeman Caldron PA-C [facility] [place]  08/10/2016, 10:56 AM

## 2016-08-10 NOTE — Patient Instructions (Signed)
For now, hold your Novolog unless your blood sugar is above 300. Restart your Lantus at 45 units at bedtime and restart metformin and Invokana. Check blood sugars 3 times daily and record and bring to next visit.

## 2016-08-10 NOTE — Telephone Encounter (Signed)
Pt. Called stating that when she came to her appt. Today the PCP forgot to give her an Rx for a meter. Pt. Would like the Rx sent  [facility] on [address]. Please f/u with pt.

## 2016-08-11 ENCOUNTER — Telehealth: Payer: Self-pay | Admitting: Internal Medicine

## 2016-08-11 MED ORDER — ACCU-CHEK AVIVA PLUS W/DEVICE KIT: 1.0000 | PACK | Freq: Three times a day (TID) | 0 refills | Status: DC

## 2016-08-11 NOTE — Telephone Encounter (Signed)
Pt. Called stating she called her pharmacy and they stated that they have not received the Rx for Blood Glucose Monitoring Suppl (ACCU-CHEK AVIVA PLUS) w/Device KIT  Pt. Would like Rx sent to [facility] on [address]. Please f/u

## 2016-08-11 NOTE — Telephone Encounter (Signed)
Accuchek Aviva device resent

## 2016-08-14 ENCOUNTER — Encounter (HOSPITAL_COMMUNITY): Payer: Self-pay

## 2016-08-14 ENCOUNTER — Ambulatory Visit (HOSPITAL_COMMUNITY)
Admission: RE | Admit: 2016-08-14 | Discharge: 2016-08-14 | Disposition: A | Discharge status: Home / Self Care | Payer: Medicaid Other | Source: Ambulatory Visit | Attending: Physician Assistant | Admitting: Physician Assistant

## 2016-08-14 DIAGNOSIS — K3184 Gastroparesis: Secondary | ICD-10-CM

## 2016-08-14 DIAGNOSIS — K5909 Other constipation: Secondary | ICD-10-CM

## 2016-08-14 DIAGNOSIS — R112 Nausea with vomiting, unspecified: Secondary | ICD-10-CM

## 2016-08-14 DIAGNOSIS — G8929 Other chronic pain: Secondary | ICD-10-CM

## 2016-08-14 DIAGNOSIS — R1011 Right upper quadrant pain: Secondary | ICD-10-CM

## 2016-08-14 NOTE — Telephone Encounter (Signed)
Patient went for GES 08/14/16 but this was cancelled same day because she vomited when given the egg sandwich for the test. Radiology cancelled out the test but did not inform anyone that they had done so nor did they let our office know that they cancelled the test and did not reschedule. I rescheduled the patient for GES on 08/25/16 @ 7:30 am, [facility] radiology, NPO midnight, hold zofran and reglan 8 hours prior to test. I have advised patient to ask for something to take in place of the eggs as their should be another option. Patient verbalizes understanding of this and states she will go for GES on 08/25/16.

## 2016-08-24 ENCOUNTER — Ambulatory Visit: Payer: Medicaid Other | Attending: Internal Medicine | Admitting: Pharmacist

## 2016-08-24 DIAGNOSIS — R351 Nocturia: Secondary | ICD-10-CM | POA: Insufficient documentation

## 2016-08-24 DIAGNOSIS — B379 Candidiasis, unspecified: Secondary | ICD-10-CM | POA: Insufficient documentation

## 2016-08-24 DIAGNOSIS — E1169 Type 2 diabetes mellitus with other specified complication: Secondary | ICD-10-CM

## 2016-08-24 DIAGNOSIS — E1165 Type 2 diabetes mellitus with hyperglycemia: Secondary | ICD-10-CM | POA: Diagnosis present

## 2016-08-24 DIAGNOSIS — Z794 Long term (current) use of insulin: Secondary | ICD-10-CM | POA: Insufficient documentation

## 2016-08-24 MED ORDER — INSULIN GLARGINE 100 UNIT/ML SOLOSTAR PEN: 50.0000 [IU] | PEN_INJECTOR | Freq: Every day | SUBCUTANEOUS | 2 refills | Status: DC

## 2016-08-24 MED ORDER — FLUCONAZOLE 150 MG PO TABS: 150.0000 mg | ORAL_TABLET | Freq: Once | ORAL | 0 refills | Status: AC

## 2016-08-24 NOTE — Patient Instructions (Addendum)
Thank you for coming to see me!  Increase Lantus to 50 units daily  I sent fluconazole to [facility]  Come back in 2 weeks    Carbohydrate Counting for Diabetes Mellitus, Adult Carbohydrate counting is a method for keeping track of how many carbohydrates you eat. Eating carbohydrates naturally increases the amount of sugar (glucose) in the blood. Counting how many carbohydrates you eat helps keep your blood glucose within normal limits, which helps you manage your diabetes (diabetes mellitus). It is important to know how many carbohydrates you can safely have in each meal. This is different for every person. A diet and nutrition specialist (registered dietitian) can help you make a meal plan and calculate how many carbohydrates you should have at each meal and snack. Carbohydrates are found in the following foods:  Grains, such as breads and cereals.  Dried beans and soy products.  Starchy vegetables, such as potatoes, peas, and corn.  Fruit and fruit juices.  Milk and yogurt.  Sweets and snack foods, such as cake, cookies, candy, chips, and soft drinks.  How do I count carbohydrates? There are two ways to count carbohydrates in food. You can use either of the methods or a combination of both. Reading "Nutrition Facts" on packaged food The "Nutrition Facts" list is included on the labels of almost all packaged foods and beverages in the [place] It includes:  The serving size.  Information about nutrients in each serving, including the grams (g) of carbohydrate per serving.  To use the "Nutrition Facts":  Decide how many servings you will have.  Multiply the number of servings by the number of carbohydrates per serving.  The resulting number is the total amount of carbohydrates that you will be having.  Learning standard serving sizes of other foods When you eat foods containing carbohydrates that are not packaged or do not include "Nutrition Facts" on the label, you need to  measure the servings in order to count the amount of carbohydrates:  Measure the foods that you will eat with a food scale or measuring cup, if needed.  Decide how many standard-size servings you will eat.  Multiply the number of servings by 15. Most carbohydrate-rich foods have about 15 g of carbohydrates per serving. ? For example, if you eat 8 oz (170 g) of strawberries, you will have eaten 2 servings and 30 g of carbohydrates (2 servings x 15 g = 30 g).  For foods that have more than one food mixed, such as soups and casseroles, you must count the carbohydrates in each food that is included.  The following list contains standard serving sizes of common carbohydrate-rich foods. Each of these servings has about 15 g of carbohydrates:   hamburger bun or  English muffin.   oz (15 mL) syrup.   oz (14 g) jelly.  1 slice of bread.  1 six-inch tortilla.  3 oz (85 g) cooked rice or pasta.  4 oz (113 g) cooked dried beans.  4 oz (113 g) starchy vegetable, such as peas, corn, or potatoes.  4 oz (113 g) hot cereal.  4 oz (113 g) mashed potatoes or  of a large baked potato.  4 oz (113 g) canned or frozen fruit.  4 oz (120 mL) fruit juice.  4-6 crackers.  6 chicken nuggets.  6 oz (170 g) unsweetened dry cereal.  6 oz (170 g) plain fat-free yogurt or yogurt sweetened with artificial sweeteners.  8 oz (240 mL) milk.  8 oz (170  g) fresh fruit or one small piece of fruit.  24 oz (680 g) popped popcorn.  Example of carbohydrate counting Sample meal  3 oz (85 g) chicken breast.  6 oz (170 g) brown rice.  4 oz (113 g) corn.  8 oz (240 mL) milk.  8 oz (170 g) strawberries with sugar-free whipped topping. Carbohydrate calculation 1. Identify the foods that contain carbohydrates: ? Rice. ? Corn. ? Milk. ? Strawberries. 2. Calculate how many servings you have of each food: ? 2 servings rice. ? 1 serving corn. ? 1 serving milk. ? 1 serving  strawberries. 3. Multiply each number of servings by 15 g: ? 2 servings rice x 15 g = 30 g. ? 1 serving corn x 15 g = 15 g. ? 1 serving milk x 15 g = 15 g. ? 1 serving strawberries x 15 g = 15 g. 4. Add together all of the amounts to find the total grams of carbohydrates eaten: ? 30 g + 15 g + 15 g + 15 g = 75 g of carbohydrates total. This information is not intended to replace advice given to you by your health care provider. Make sure you discuss any questions you have with your health care provider. Document Released: 12/26/2004 Document Revised: 07/16/2015 Document Reviewed: 06/09/2015 Elsevier Interactive Patient Education  [employer].

## 2016-08-24 NOTE — Progress Notes (Signed)
    S:     Chief Complaint  Patient presents with  . Medication Management    Patient arrives in good spirits.  Presents for diabetes evaluation, education, and management at the request of Curator. Doreene Burke. Patient was referred on 08/10/16.  Patient was last seen by Primary Care Provider on 08/10/16.   Patient reports adherence with medications. She reports that she didn't take her medications before due to feeling so badly but she is working really hard to be better and to not miss any doses.  Current diabetes medications include: metformin 1000 mg BID, Invokana 300 mg daily, Lantus 45 units daily. Novolog 15 units TID was held at last visit due to noncompliance.  She reports that in the past it was hard to keep the Novolog and Lantus straight.   Patient denies hypoglycemic events.  Patient reported dietary habits: trying to do better about what she eats.  Patient reported exercise habits: denies   Patient reports nocturia.  Patient denies neuropathy. Patient denies visual changes. Patient reports self foot exams.   Patient reports yeast infection. She typically gets them when she has elevated blood sugars.  O:  Physical Exam   ROS   Lab Results  Component Value Date   HGBA1C 13.9 (H) 08/04/2016   There were no vitals filed for this visit.  Home fasting CBG: 151-216  2 hour post-prandial/random CBG: 182-315  A/P: Diabetes longstanding currently uncontrolled with A1c of 13.9. Patient denies hypoglycemic events and is able to verbalize appropriate hypoglycemia management plan. Patient reports adherence with medication. Control is suboptimal due to dietary indiscretion, sedentary lifestyle, and medication nonadherence in the past.  Continue metformin 1000 mg BID, Invokana 300 mg daily, increase Lantus to 50 units daily. Discussed basic carb counting but patient may have to go back on Novolog to help get blood sugars under control. Will reassess at next visit.    Discussed with Dr. Doreene Burke, will refill fluconazole 1 tablet by mouth once for yeast infection.   Next A1C anticipated November 2018.    Written patient instructions provided.  Total time in face to face counseling 15 minutes.   Follow up in Pharmacist Clinic Visit in 2 weeks. Patient needs new PCP as well.  Patient seen with Waverly Ferrari, PharmD Candidate

## 2016-08-25 ENCOUNTER — Ambulatory Visit (HOSPITAL_COMMUNITY)
Admission: RE | Admit: 2016-08-25 | Discharge: 2016-08-25 | Disposition: A | Discharge status: Home / Self Care | Payer: Medicaid Other | Source: Ambulatory Visit | Attending: Physician Assistant | Admitting: Physician Assistant

## 2016-08-25 DIAGNOSIS — R112 Nausea with vomiting, unspecified: Secondary | ICD-10-CM | POA: Insufficient documentation

## 2016-08-25 DIAGNOSIS — K5909 Other constipation: Secondary | ICD-10-CM | POA: Insufficient documentation

## 2016-08-25 DIAGNOSIS — K3184 Gastroparesis: Secondary | ICD-10-CM | POA: Insufficient documentation

## 2016-08-25 DIAGNOSIS — G8929 Other chronic pain: Secondary | ICD-10-CM | POA: Insufficient documentation

## 2016-08-25 DIAGNOSIS — R1011 Right upper quadrant pain: Secondary | ICD-10-CM | POA: Insufficient documentation

## 2016-08-25 MED ORDER — TECHNETIUM TC 99M SULFUR COLLOID: 2.1000 | Freq: Once | INTRAVENOUS | Status: DC | PRN

## 2016-09-06 ENCOUNTER — Ambulatory Visit: Payer: Medicaid Other | Admitting: Pharmacist

## 2016-09-13 ENCOUNTER — Ambulatory Visit: Payer: Medicaid Other | Admitting: Pharmacist

## 2016-09-15 ENCOUNTER — Encounter: Payer: Self-pay | Admitting: Family Medicine

## 2016-09-15 ENCOUNTER — Ambulatory Visit: Payer: Medicaid Other | Attending: Family Medicine | Admitting: Family Medicine

## 2016-09-15 VITALS — BP 125/74 | HR 75 | Temp 98.3°F | Ht 66.0 in | Wt 299.6 lb

## 2016-09-15 DIAGNOSIS — K3184 Gastroparesis: Secondary | ICD-10-CM | POA: Diagnosis not present

## 2016-09-15 DIAGNOSIS — E1169 Type 2 diabetes mellitus with other specified complication: Secondary | ICD-10-CM | POA: Diagnosis not present

## 2016-09-15 DIAGNOSIS — E1140 Type 2 diabetes mellitus with diabetic neuropathy, unspecified: Secondary | ICD-10-CM | POA: Insufficient documentation

## 2016-09-15 DIAGNOSIS — E1165 Type 2 diabetes mellitus with hyperglycemia: Secondary | ICD-10-CM | POA: Diagnosis not present

## 2016-09-15 DIAGNOSIS — E1142 Type 2 diabetes mellitus with diabetic polyneuropathy: Secondary | ICD-10-CM | POA: Diagnosis present

## 2016-09-15 DIAGNOSIS — M130 Polyarthritis, unspecified: Secondary | ICD-10-CM | POA: Insufficient documentation

## 2016-09-15 DIAGNOSIS — M1990 Unspecified osteoarthritis, unspecified site: Secondary | ICD-10-CM | POA: Diagnosis not present

## 2016-09-15 DIAGNOSIS — I10 Essential (primary) hypertension: Secondary | ICD-10-CM | POA: Diagnosis not present

## 2016-09-15 DIAGNOSIS — E1143 Type 2 diabetes mellitus with diabetic autonomic (poly)neuropathy: Secondary | ICD-10-CM | POA: Diagnosis not present

## 2016-09-15 DIAGNOSIS — E669 Obesity, unspecified: Secondary | ICD-10-CM | POA: Diagnosis not present

## 2016-09-15 DIAGNOSIS — J45909 Unspecified asthma, uncomplicated: Secondary | ICD-10-CM | POA: Insufficient documentation

## 2016-09-15 DIAGNOSIS — Z7982 Long term (current) use of aspirin: Secondary | ICD-10-CM | POA: Insufficient documentation

## 2016-09-15 DIAGNOSIS — F411 Generalized anxiety disorder: Secondary | ICD-10-CM

## 2016-09-15 DIAGNOSIS — E1149 Type 2 diabetes mellitus with other diabetic neurological complication: Secondary | ICD-10-CM

## 2016-09-15 DIAGNOSIS — K449 Diaphragmatic hernia without obstruction or gangrene: Secondary | ICD-10-CM | POA: Diagnosis not present

## 2016-09-15 DIAGNOSIS — K219 Gastro-esophageal reflux disease without esophagitis: Secondary | ICD-10-CM | POA: Diagnosis not present

## 2016-09-15 DIAGNOSIS — Z794 Long term (current) use of insulin: Secondary | ICD-10-CM | POA: Insufficient documentation

## 2016-09-15 DIAGNOSIS — F329 Major depressive disorder, single episode, unspecified: Secondary | ICD-10-CM | POA: Insufficient documentation

## 2016-09-15 LAB — GLUCOSE, POCT (MANUAL RESULT ENTRY): POC GLUCOSE: 274 mg/dL — AB (ref 70–99)

## 2016-09-15 MED ORDER — INSULIN ASPART 100 UNIT/ML FLEXPEN: 0.0000 [IU] | PEN_INJECTOR | Freq: Three times a day (TID) | SUBCUTANEOUS | 6 refills | Status: DC

## 2016-09-15 MED ORDER — GABAPENTIN 300 MG PO CAPS: 300.0000 mg | ORAL_CAPSULE | Freq: Every day | ORAL | 3 refills | Status: DC

## 2016-09-15 MED ORDER — NAPROXEN 500 MG PO TABS: 500.0000 mg | ORAL_TABLET | Freq: Two times a day (BID) | ORAL | 0 refills | Status: DC

## 2016-09-15 MED ORDER — TRAMADOL HCL 50 MG PO TABS: ORAL_TABLET | ORAL | 0 refills | Status: DC

## 2016-09-15 MED ORDER — INSULIN GLARGINE 100 UNIT/ML SOLOSTAR PEN: 60.0000 [IU] | PEN_INJECTOR | Freq: Every day | SUBCUTANEOUS | 3 refills | Status: DC

## 2016-09-15 NOTE — Progress Notes (Signed)
Subjective:  Patient ID: Tara Keller, female    DOB: 10-30-1965  Age: 51 y.o. MRN: [id number]  CC: Diabetes   HPI Tara Keller is a 51 year old female with a history of uncontrolled type 2 diabetes mellitus (A1c 13.9), diabetic neuropathy, GERD, gastroparesis, depression and anxiety who presents today to establish care with me.  Her blood sugar log reveals a 30 day average of 213 and she endorses compliance with Lantus, metformin, Invokana. She was previously on NovoLog 15 units 3 times daily but states she was informed at her last visit with the PA to hold off NovoLog. She has been unable to comply with a diabetic diet due to financial constraints.  She complains her neuropathy is not controlled on gabapentin and she experiences numbness and shooting pains in her feet. She also has visual abnormalities but is scheduled to see ophthalmology next week.  Her gastroparesis and reflux symptoms have been controlled on current regimen of metoclopramide, pantoprazole and Dulcolax.  She complains of generalized weakness, dizziness and aches in all her joints which she describes as "like an arthritis feeling". She took tramadol in the past which helped her symptoms.  Past Medical History:  Diagnosis Date  . Anxiety   . Arthritis   . Asthma   . Depression   . Diabetes mellitus   . Fatty liver   . Fracture closed of upper end of forearm 9 years, Fx left left leg  . Fracture of left lower leg @ 51 years old  . H/O tubal ligation   . Hiatal hernia   . Hypertension   . Obesity   . Positive H. pylori test   . Sleep apnea    uses CPAP  . Tubular adenoma of colon     Past Surgical History:  Procedure Laterality Date  . CHOLECYSTECTOMY    . COLONOSCOPY WITH PROPOFOL N/A 08/15/2013   Procedure: COLONOSCOPY WITH PROPOFOL;  Surgeon: Jerene Bears, MD;  Location: [facility];  Service: Gastroenterology;  Laterality: N/A;  . ESOPHAGOGASTRODUODENOSCOPY N/A 05/12/2014   Procedure:  ESOPHAGOGASTRODUODENOSCOPY (EGD);  Surgeon: Jerene Bears, MD;  Location: [facility];  Service: Gastroenterology;  Laterality: N/A;  . ESOPHAGOGASTRODUODENOSCOPY (EGD) WITH PROPOFOL N/A 08/15/2013   Procedure: ESOPHAGOGASTRODUODENOSCOPY (EGD) WITH PROPOFOL;  Surgeon: Jerene Bears, MD;  Location: [facility];  Service: Gastroenterology;  Laterality: N/A;  . TUBAL LIGATION Bilateral     No Known Allergies   Outpatient Medications Prior to Visit  Medication Sig Dispense Refill  . ACCU-CHEK AVIVA PLUS test strip TEST three times a day 100 each 12  . ACCU-CHEK FASTCLIX LANCETS MISC Use as directed 100 each 12  . albuterol (PROVENTIL HFA;VENTOLIN HFA) 108 (90 Base) MCG/ACT inhaler Inhale 1-2 puffs into the lungs every 6 (six) hours as needed for wheezing or shortness of breath. 1 Inhaler 0  . aspirin EC 81 MG tablet Take 1 tablet (81 mg total) by mouth daily. 90 tablet 3  . bisacodyl (DULCOLAX) 10 MG suppository Place 1 suppository (10 mg total) rectally as needed for moderate constipation. 4 suppository 0  . Blood Glucose Monitoring Suppl (ACCU-CHEK AVIVA PLUS) w/Device KIT 1 each by Does not apply route 3 (three) times daily. 1 kit 0  . buPROPion (WELLBUTRIN XL) 150 MG 24 hr tablet Take 450 mg by mouth daily.    . clonazePAM (KLONOPIN) 1 MG tablet Take 1 mg by mouth at bedtime.    . docusate sodium (COLACE) 50 MG capsule Take 1 capsule (50 mg total)  by mouth 2 (two) times daily. 10 capsule 0  . DULoxetine (CYMBALTA) 60 MG capsule Take 60 mg by mouth 2 (two) times daily.     . Insulin Pen Needle 31G X 8 MM MISC Use as directed for 4 times daily insulin administration. 100 each 12  . Insulin Syringe-Needle U-100 (BD INSULIN SYRINGE ULTRAFINE) 31G X 15/64" 0.5 ML MISC Use as directed 100 each 5  . INVOKANA 300 MG TABS tablet Take 1 tablet (300 mg total) by mouth daily. 90 tablet 3  . lisinopril (PRINIVIL,ZESTRIL) 5 MG tablet Take 1 tablet (5 mg total) by mouth daily. 90 tablet 3  . metFORMIN  (GLUCOPHAGE XR) 500 MG 24 hr tablet Take 2 tablets (1,000 mg total) by mouth 2 (two) times daily. 120 tablet 2  . metoCLOPramide (REGLAN) 5 MG tablet Take 1/2 tablet before a meal. 90 tablet 0  . NEEDLE, DISP, 24 G (B-D DISP NEEDLE 24GX1") 24G X 1" MISC Use with solostar pen to inject 4 times daily 200 each 1  . nicotine (NICODERM CQ) 21 mg/24hr patch Place 1 patch (21 mg total) onto the skin daily. 28 patch 0  . ondansetron (ZOFRAN ODT) 4 MG disintegrating tablet Take 1 tab, dissolve on the tongue every 6 hours as needed for nausea. 100 tablet 0  . ondansetron (ZOFRAN) 4 MG tablet Take 1 tablet (4 mg total) by mouth every 6 (six) hours. 12 tablet 0  . pantoprazole (PROTONIX) 40 MG tablet Take 1 tablet (40 mg total) by mouth 2 (two) times daily. 60 tablet 6  . polyethylene glycol (MIRALAX) packet Take 17 g by mouth daily. 14 each 0  . pravastatin (PRAVACHOL) 20 MG tablet Take 1 tablet (20 mg total) by mouth daily. 90 tablet 3  . zolpidem (AMBIEN) 5 MG tablet Take 5 mg by mouth at bedtime.    . gabapentin (NEURONTIN) 100 MG capsule Take 1 capsule (100 mg total) by mouth every morning. 90 capsule 3  . insulin aspart (NOVOLOG) 100 UNIT/ML FlexPen Inject 15 Units into the skin 3 (three) times daily with meals. 15 mL 11  . Insulin Glargine (LANTUS SOLOSTAR) 100 UNIT/ML Solostar Pen Inject 50 Units into the skin daily at 10 pm. 5 pen 2  . traMADol (ULTRAM) 50 MG tablet Take 1 tab every 8 hours as needed for pain. 15 tablet 0   No facility-administered medications prior to visit.     ROS Review of Systems  Constitutional: Negative for activity change, appetite change and fatigue.  HENT: Negative for congestion, sinus pressure and sore throat.   Eyes: Positive for visual disturbance.  Respiratory: Negative for cough, chest tightness, shortness of breath and wheezing.   Cardiovascular: Negative for chest pain and palpitations.  Gastrointestinal: Negative for abdominal distention, abdominal pain and  constipation.  Endocrine: Negative for polydipsia.  Genitourinary: Negative for dysuria and frequency.  Musculoskeletal:       See hpi  Skin: Negative for rash.  Neurological: Positive for numbness. Negative for tremors and light-headedness.  Hematological: Does not bruise/bleed easily.  Psychiatric/Behavioral: Negative for agitation and behavioral problems.    Objective:  BP 125/74   Pulse 75   Temp 98.3 F (36.8 C) (Oral)   Ht _0  (1.676 m)   Wt 299 lb 9.6 oz (135.9 kg)   LMP 01/23/2014 (Approximate) Comment: no cycle since this date  SpO2 97%   BMI 48.36 kg/m   BP/Weight 09/15/2016 08/10/2016 6/97/9480  Systolic BP 165 537 482  Diastolic BP 74  65 74  Wt. (Lbs) 299.6 285 282  BMI 48.36 46 45.52      Physical Exam  Constitutional: She is oriented to person, place, and time. She appears well-developed and well-nourished.  Obese  Cardiovascular: Normal rate, normal heart sounds and intact distal pulses.   No murmur heard. Pulmonary/Chest: Effort normal and breath sounds normal. She has no wheezes. She has no rales. She exhibits no tenderness.  Abdominal: Soft. Bowel sounds are normal. She exhibits no distension and no mass. There is no tenderness.  Musculoskeletal: Normal range of motion.  Neurological: She is alert and oriented to person, place, and time.  Hyperesthesia on palpation of soles of feet  Skin: Skin is warm and dry.  Psychiatric: She has a normal mood and affect.    CMP Latest Ref Rng & Units 08/04/2016 07/29/2016 07/28/2016  Glucose 70 - 99 mg/dL 289(H) 378(H) 295(H)  BUN 6 - 23 mg/dL 6 6 4(L)  Creatinine 0.40 - 1.20 mg/dL 0.68 0.84 0.50  Sodium 135 - 145 mEq/L 136 132(L) 136  Potassium 3.5 - 5.1 mEq/L 4.0 3.5 3.3(L)  Chloride 96 - 112 mEq/L 95(L) 96(L) 93(L)  CO2 19 - 32 mEq/L 33(H) 28 -  Calcium 8.4 - 10.5 mg/dL 10.3 9.4 -  Total Protein 6.0 - 8.3 g/dL 8.1 7.7 -  Total Bilirubin 0.2 - 1.2 mg/dL 0.5 0.5 -  Alkaline Phos 39 - 117 U/L 146(H) 143(H) -    AST 0 - 37 U/L 14 18 -  ALT 0 - 35 U/L 21 18 -    Lab Results  Component Value Date   HGBA1C 13.9 (H) 08/04/2016    Assessment & Plan:   1. Type 2 diabetes mellitus with other specified complication, without long-term current use of insulin (HCC) Uncontrolled with A1c of 13.9 Increase Lantus dose from 50 units to 60 units Switched fixed dose of NovoLog to sliding scale Diabetic diet - POCT glucose (manual entry) - Insulin Glargine (LANTUS SOLOSTAR) 100 UNIT/ML Solostar Pen; Inject 60 Units into the skin daily at 10 pm.  Dispense: 5 pen; Refill: 3 - insulin aspart (NOVOLOG) 100 UNIT/ML FlexPen; Inject 0-12 Units into the skin 3 (three) times daily with meals. As per sliding scale  Dispense: 15 mL; Refill: 6  2. Gastroesophageal reflux disease without esophagitis Controlled Continue PPI  3. Other diabetic neurological complication associated with type 2 diabetes mellitus (HCC) Uncontrolled Increased dose of gabapentin - gabapentin (NEURONTIN) 300 MG capsule; Take 1 capsule (300 mg total) by mouth at bedtime.  Dispense: 30 capsule; Refill: 3  4. Arthritis Uncontrolled We'll add NSAIDS to regimen - naproxen (NAPROSYN) 500 MG tablet; Take 1 tablet (500 mg total) by mouth 2 (two) times daily with a meal.  Dispense: 30 tablet; Refill: 0 - traMADol (ULTRAM) 50 MG tablet; Take 1 tab every 8 hours as needed for pain.  Dispense: 15 tablet; Refill: 0  5. Generalized anxiety disorder Previously followed by psychiatry She is currently not taking any medications.   Meds ordered this encounter  Medications  . Insulin Glargine (LANTUS SOLOSTAR) 100 UNIT/ML Solostar Pen    Sig: Inject 60 Units into the skin daily at 10 pm.    Dispense:  5 pen    Refill:  3    Discontinue previous dose  . insulin aspart (NOVOLOG) 100 UNIT/ML FlexPen    Sig: Inject 0-12 Units into the skin 3 (three) times daily with meals. As per sliding scale    Dispense:  15 mL  Refill:  6  . naproxen (NAPROSYN)  500 MG tablet    Sig: Take 1 tablet (500 mg total) by mouth 2 (two) times daily with a meal.    Dispense:  30 tablet    Refill:  0  . traMADol (ULTRAM) 50 MG tablet    Sig: Take 1 tab every 8 hours as needed for pain.    Dispense:  15 tablet    Refill:  0  . gabapentin (NEURONTIN) 300 MG capsule    Sig: Take 1 capsule (300 mg total) by mouth at bedtime.    Dispense:  30 capsule    Refill:  3    Follow-up: Return in about 1 month (around 10/15/2016) for Follow up of diabetes mellitus.   Arnoldo Morale MD

## 2016-09-15 NOTE — Patient Instructions (Signed)
Diabetic Neuropathy Diabetic neuropathy is a nerve disease or nerve damage that is caused by diabetes mellitus. About half of all people with diabetes mellitus have some form of nerve damage. Nerve damage is more common in those who have had diabetes mellitus for many years and who generally have not had good control of their blood sugar (glucose) level. Diabetic neuropathy is a common complication of diabetes mellitus. There are three common types of diabetic neuropathy and a fourth type that is less common and less understood:  Peripheral neuropathy-This is the most common type of diabetic neuropathy. It causes damage to the nerves of the feet and legs first and then eventually the hands and arms. The damage affects the ability to sense touch.  Autonomic neuropathy-This type causes damage to the autonomic nervous system, which controls the following functions: ? Heartbeat. ? Body temperature. ? Blood pressure. ? Urination. ? Digestion. ? Sweating. ? Sexual function.  Focal neuropathy-Focal neuropathy can be painful and unpredictable and occurs most often in older adults with diabetes mellitus. It involves a specific nerve or one area and often comes on suddenly. It usually does not cause long-term problems.  Radiculoplexus neuropathy- Sometimes called lumbosacral radiculoplexus neuropathy, radiculoplexus neuropathy affects the nerves of the thighs, hips, buttocks, or legs. It is more common in people with type 2 diabetes mellitus and in older men. It is characterized by debilitating pain, weakness, and atrophy, usually in the thigh muscles.  What are the causes? The cause of peripheral, autonomic, and focal neuropathies is diabetes mellitus that is uncontrolled and high glucose levels. The cause of radiculoplexus neuropathy is unknown. However, it is thought to be caused by inflammation related to uncontrolled glucose levels. What are the signs or symptoms? Peripheral Neuropathy Peripheral  neuropathy develops slowly over time. When the nerves of the feet and legs no longer work there may be:  Burning, stabbing, or aching pain in the legs or feet.  Inability to feel pressure or pain in your feet. This can lead to: ? Thick calluses over pressure areas. ? Pressure sores. ? Ulcers.  Foot deformities.  Reduced ability to feel temperature changes.  Muscle weakness.  Autonomic Neuropathy The symptoms of autonomic neuropathy vary depending on which nerves are affected. Symptoms may include:  Problems with digestion, such as: ? Feeling sick to your stomach (nausea). ? Vomiting. ? Bloating. ? Constipation. ? Diarrhea. ? Abdominal pain.  Difficulty with urination. This occurs if you lose your ability to sense when your bladder is full. Problems include: ? Urine leakage (incontinence). ? Inability to empty your bladder completely (retention).  Rapid or irregular heartbeat (palpitations).  Blood pressure drops when you stand up (orthostatic hypotension). When you stand up you may feel: ? Dizzy. ? Weak. ? Faint.  In men, inability to attain and maintain an erection.  In women, vaginal dryness and problems with decreased sexual desire and arousal.  Problems with body temperature regulation.  Increased or decreased sweating.  Focal Neuropathy  Abnormal eye movements or abnormal alignment of both eyes.  Weakness in the wrist.  Foot drop. This results in an inability to lift the foot properly and abnormal walking or foot movement.  Paralysis on one side of your face (Bell palsy).  Chest or abdominal pain. Radiculoplexus Neuropathy  Sudden, severe pain in your hip, thigh, or buttocks.  Weakness and wasting of thigh muscles.  Difficulty rising from a seated position.  Abdominal swelling.  Unexplained weight loss (usually more than 10 lb [4.5 kg]). How is   this diagnosed? Peripheral Neuropathy Your senses may be tested. Sensory function testing can be  done with:  A light touch using a monofilament.  A vibration with tuning fork.  A sharp sensation with a pin prick.  Other tests that can help diagnose neuropathy are:  Nerve conduction velocity. This test checks the transmission of an electrical current through a nerve.  Electromyography. This shows how muscles respond to electrical signals transmitted by nearby nerves.  Quantitative sensory testing. This is used to assess how your nerves respond to vibrations and changes in temperature.  Autonomic Neuropathy Diagnosis is often based on reported symptoms. Tell your health care provider if you experience:  Dizziness.  Constipation.  Diarrhea.  Inappropriate urination or inability to urinate.  Inability to get or maintain an erection.  Tests that may be done include:  Electrocardiography or Holter monitor. These are tests that can help show problems with the heart rate or heart rhythm.  An X-ray exam may be done.  Focal Neuropathy Diagnosis is made based on your symptoms and what your health care provider finds during your exam. Other tests may be done. They may include:  Nerve conduction velocities. This checks the transmission of electrical current through a nerve.  Electromyography. This shows how muscles respond to electrical signals transmitted by nearby nerves.  Quantitative sensory testing. This test is used to assess how your nerves respond to vibration and changes in temperature.  Radiculoplexus Neuropathy  Often the first thing is to eliminate any other issue or problems that might be the cause, as there is no standard test for diagnosis.  X-ray exam of your spine and lumbar region.  Spinal tap to rule out cancer.  MRI to rule out other lesions. How is this treated? Once nerve damage occurs, it cannot be reversed. The goal of treatment is to keep the disease or nerve damage from getting worse and affecting more nerve fibers. Controlling your blood  glucose level is the key. Most people with radiculoplexus neuropathy see at least a partial improvement over time. You will need to keep your blood glucose and HbA1c levels in the target range determined by your health care provider. Things that help control blood glucose levels include:  Blood glucose monitoring.  Meal planning.  Physical activity.  Diabetes medicine.  Over time, maintaining lower blood glucose levels helps lessen symptoms. Sometimes, prescription pain medicine is needed. Follow these instructions at home:  Do not smoke.  Keep your blood glucose level in the range that you and your health care provider have determined acceptable for you.  Keep your blood pressure level in the range that you and your health care provider have determined acceptable for you.  Eat a well-balanced diet.  Be physically active every day. Include strength training and balance exercises.  Protect your feet. ? Check your feet every day for sores, cuts, blisters, or signs of infection. ? Wear padded socks and supportive shoes. Use orthotic inserts, if necessary. ? Regularly check the insides of your shoes for worn spots. Make sure there are no rocks or other items inside your shoes before you put them on. Contact a health care provider if:  You have burning, stabbing, or aching pain in the legs or feet.  You are unable to feel pressure or pain in your feet.  You develop problems with digestion such as: ? Nausea. ? Vomiting. ? Bloating. ? Constipation. ? Diarrhea. ? Abdominal pain.  You have difficulty with urination, such as: ? Incontinence. ? Retention.    You have palpitations.  You develop orthostatic hypotension. When you stand up you may feel: ? Dizzy. ? Weak. ? Faint.  You cannot attain and maintain an erection (in men).  You have vaginal dryness and problems with decreased sexual desire and arousal (in women).  You have severe pain in your thighs, legs, or  buttocks.  You have unexplained weight loss. This information is not intended to replace advice given to you by your health care provider. Make sure you discuss any questions you have with your health care provider. Document Released: 03/06/2001 Document Revised: 06/03/2015 Document Reviewed: 06/06/2012 Elsevier Interactive Patient Education  2017 [employer]  

## 2016-10-05 ENCOUNTER — Emergency Department (HOSPITAL_COMMUNITY): Payer: Medicaid Other

## 2016-10-05 ENCOUNTER — Encounter (HOSPITAL_COMMUNITY): Payer: Self-pay | Admitting: Emergency Medicine

## 2016-10-05 ENCOUNTER — Emergency Department (HOSPITAL_COMMUNITY)
Admission: EM | Admit: 2016-10-05 | Discharge: 2016-10-05 | Disposition: A | Discharge status: Home / Self Care | Payer: Medicaid Other | Attending: Emergency Medicine | Admitting: Emergency Medicine

## 2016-10-05 DIAGNOSIS — J209 Acute bronchitis, unspecified: Secondary | ICD-10-CM | POA: Diagnosis not present

## 2016-10-05 DIAGNOSIS — E119 Type 2 diabetes mellitus without complications: Secondary | ICD-10-CM | POA: Diagnosis not present

## 2016-10-05 DIAGNOSIS — J45909 Unspecified asthma, uncomplicated: Secondary | ICD-10-CM | POA: Diagnosis not present

## 2016-10-05 DIAGNOSIS — Z87891 Personal history of nicotine dependence: Secondary | ICD-10-CM | POA: Insufficient documentation

## 2016-10-05 DIAGNOSIS — R0989 Other specified symptoms and signs involving the circulatory and respiratory systems: Secondary | ICD-10-CM | POA: Insufficient documentation

## 2016-10-05 DIAGNOSIS — Z79899 Other long term (current) drug therapy: Secondary | ICD-10-CM | POA: Insufficient documentation

## 2016-10-05 DIAGNOSIS — Z7982 Long term (current) use of aspirin: Secondary | ICD-10-CM | POA: Diagnosis not present

## 2016-10-05 DIAGNOSIS — R05 Cough: Secondary | ICD-10-CM

## 2016-10-05 DIAGNOSIS — R059 Cough, unspecified: Secondary | ICD-10-CM

## 2016-10-05 DIAGNOSIS — Z794 Long term (current) use of insulin: Secondary | ICD-10-CM | POA: Diagnosis not present

## 2016-10-05 DIAGNOSIS — I10 Essential (primary) hypertension: Secondary | ICD-10-CM | POA: Diagnosis not present

## 2016-10-05 MED ORDER — IPRATROPIUM BROMIDE 0.02 % IN SOLN
0.5000 mg | Freq: Once | RESPIRATORY_TRACT | Status: AC
  Administered 2016-10-05: 0.5 mg via RESPIRATORY_TRACT
  Filled 2016-10-05: qty 2.5

## 2016-10-05 MED ORDER — BENZONATATE 100 MG PO CAPS
100.0000 mg | ORAL_CAPSULE | Freq: Once | ORAL | Status: AC
  Administered 2016-10-05: 100 mg via ORAL
  Filled 2016-10-05: qty 1

## 2016-10-05 MED ORDER — ALBUTEROL SULFATE HFA 108 (90 BASE) MCG/ACT IN AERS
2.0000 | INHALATION_SPRAY | RESPIRATORY_TRACT | Status: DC | PRN
  Administered 2016-10-05: 2 via RESPIRATORY_TRACT
  Filled 2016-10-05: qty 6.7

## 2016-10-05 MED ORDER — BENZONATATE 100 MG PO CAPS: 100.0000 mg | ORAL_CAPSULE | Freq: Three times a day (TID) | ORAL | 0 refills | Status: DC

## 2016-10-05 MED ORDER — ALBUTEROL SULFATE (2.5 MG/3ML) 0.083% IN NEBU
5.0000 mg | INHALATION_SOLUTION | Freq: Once | RESPIRATORY_TRACT | Status: AC
  Administered 2016-10-05: 5 mg via RESPIRATORY_TRACT
  Filled 2016-10-05: qty 6

## 2016-10-05 MED ORDER — AEROCHAMBER PLUS FLO-VU SMALL MISC
1.0000 | Freq: Once | Status: DC
  Filled 2016-10-05: qty 1

## 2016-10-05 NOTE — Discharge Instructions (Signed)
Please read and follow all provided instructions.  Your diagnoses today include:  1. Cough   2. Acute bronchitis, unspecified organism    Tests performed today include:  Chest x-ray - does not show any pneumonia  Vital signs. See below for your results today.   Medications prescribed:   Albuterol inhaler - medication that opens up your airway  Use inhaler as follows: 1-2 puffs with spacer every 4 hours as needed for wheezing, cough, or shortness of breath.    Tessalon Perles - cough suppressant medication  Take any prescribed medications only as directed.  Home care instructions:  Follow any educational materials contained in this packet.  Follow-up instructions: Please follow-up with your primary care provider in the next 5 days for further evaluation of your symptoms and a recheck if you are not feeling better.   Return instructions:   Please return to the Emergency Department if you experience worsening symptoms.  Please return with worsening wheezing, shortness of breath, or difficulty breathing.  Return with persistent fever above 101F.   Please return if you have any other emergent concerns.  Additional Information:  Your vital signs today were: BP 109/82 (BP Location: Left Arm)    Pulse 87    Temp 98.1 F (36.7 C) (Oral)    Resp 16    LMP 01/23/2014 (Approximate) Comment: no cycle since this date   SpO2 100%  If your blood pressure (BP) was elevated above 135/85 this visit, please have this repeated by your doctor within one month. --------------

## 2016-10-05 NOTE — ED Triage Notes (Signed)
Patient presents with productive cough x 1 week. "Cant get the phlegm up".

## 2016-10-05 NOTE — ED Provider Notes (Signed)
[facility] DEPT Provider Note   CSN: [id number] Arrival date & time: 10/05/16  1517     History   Chief Complaint Chief Complaint  Patient presents with  . Cough    HPI Tara Keller is a 51 y.o. female.  Patient with history of diabetes, sleep apnea, smoking -- presents with complaint of ongoing cough for the past 7 days. Patient reports Bashor rhinorrhea with more extensive chest congestion. Cough is mildly productive of clear sputum. No fevers, sore throat, chest pain. She does have some occasional wheezing. No nausea, vomiting, or abdominal pain. Patient has used over-the-counter medications such as Mucinex without much relief. The onset of this condition was acute. The course is constant. Aggravating factors: none. Alleviating factors: none.        Past Medical History:  Diagnosis Date  . Anxiety   . Arthritis   . Asthma   . Depression   . Diabetes mellitus   . Fatty liver   . Fracture closed of upper end of forearm 9 years, Fx left left leg  . Fracture of left lower leg @ 51 years old  . H/O tubal ligation   . Hiatal hernia   . Hypertension   . Obesity   . Positive H. pylori test   . Sleep apnea    uses CPAP  . Tubular adenoma of colon     Patient Active Problem List   Diagnosis Date Noted  . Diabetic neuropathy ([place]) 09/15/2016  . Arthritis 09/15/2016  . Hx of adenomatous colonic polyps 08/04/2016  . OSA on CPAP 07/06/2015  . Depression (emotion) 07/06/2015  . Abnormal barium swallow   . Abdominal pain, epigastric 08/15/2013  . Benign neoplasm of colon 08/15/2013  . Special screening for malignant neoplasms, colon 08/15/2013  . Hepatic steatosis 05/19/2013  . History of pancreatitis 05/19/2013  . Family history of colon cancer 05/19/2013  . GERD (gastroesophageal reflux disease) 02/18/2013  . Smoking 02/18/2013  . Morbid obesity ([place]) 05/28/2012  . Type 2 diabetes mellitus ([place]) 05/28/2012  . Osteoarthritis of left and right knee 05/28/2012  .  Generalized anxiety disorder 05/28/2012    Past Surgical History:  Procedure Laterality Date  . CHOLECYSTECTOMY    . COLONOSCOPY WITH PROPOFOL N/A 08/15/2013   Procedure: COLONOSCOPY WITH PROPOFOL;  Surgeon: Jerene Bears, MD;  Location: [facility];  Service: Gastroenterology;  Laterality: N/A;  . ESOPHAGOGASTRODUODENOSCOPY N/A 05/12/2014   Procedure: ESOPHAGOGASTRODUODENOSCOPY (EGD);  Surgeon: Jerene Bears, MD;  Location: [facility];  Service: Gastroenterology;  Laterality: N/A;  . ESOPHAGOGASTRODUODENOSCOPY (EGD) WITH PROPOFOL N/A 08/15/2013   Procedure: ESOPHAGOGASTRODUODENOSCOPY (EGD) WITH PROPOFOL;  Surgeon: Jerene Bears, MD;  Location: [facility];  Service: Gastroenterology;  Laterality: N/A;  . TUBAL LIGATION Bilateral     OB History    Gravida Para Term Preterm AB Living   _0 SAB TAB Ectopic Multiple Live Births         1         Home Medications    Prior to Admission medications   Medication Sig Start Date End Date Taking? Authorizing Provider  ACCU-CHEK AVIVA PLUS test strip TEST three times a day 09/08/15   Maren Reamer, MD  ACCU-CHEK FASTCLIX LANCETS MISC Use as directed 12/22/15   Maren Reamer, MD  albuterol (PROVENTIL HFA;VENTOLIN HFA) 108 (90 Base) MCG/ACT inhaler Inhale 1-2 puffs into the lungs every 6 (six) hours as needed for wheezing or shortness of breath.  05/13/98   Delora Fuel, MD  aspirin EC 81 MG tablet Take 1 tablet (81 mg total) by mouth daily. 04/25/16   Maren Reamer, MD  bisacodyl (DULCOLAX) 10 MG suppository Place 1 suppository (10 mg total) rectally as needed for moderate constipation. 07/28/16   Tasia Catchings, Amy V, PA-C  Blood Glucose Monitoring Suppl (ACCU-CHEK AVIVA PLUS) w/Device KIT 1 each by Does not apply route 3 (three) times daily. 08/11/16   Argentina Donovan, PA-C  buPROPion (WELLBUTRIN XL) 150 MG 24 hr tablet Take 450 mg by mouth daily.    [provider]  clonazePAM (KLONOPIN) 1 MG tablet Take 1 mg by mouth at bedtime.     [provider]  docusate sodium (COLACE) 50 MG capsule Take 1 capsule (50 mg total) by mouth 2 (two) times daily. 07/28/16   Tasia Catchings, Amy V, PA-C  DULoxetine (CYMBALTA) 60 MG capsule Take 60 mg by mouth 2 (two) times daily.     [provider]  gabapentin (NEURONTIN) 300 MG capsule Take 1 capsule (300 mg total) by mouth at bedtime. 09/15/16   Arnoldo Morale, MD  insulin aspart (NOVOLOG) 100 UNIT/ML FlexPen Inject 0-12 Units into the skin 3 (three) times daily with meals. As per sliding scale 09/15/16   Arnoldo Morale, MD  Insulin Glargine (LANTUS SOLOSTAR) 100 UNIT/ML Solostar Pen Inject 60 Units into the skin daily at 10 pm. 09/15/16   Arnoldo Morale, MD  Insulin Pen Needle 31G X 8 MM MISC Use as directed for 4 times daily insulin administration. 10/08/15   Maren Reamer, MD  Insulin Syringe-Needle U-100 (BD INSULIN SYRINGE ULTRAFINE) 31G X 15/64" 0.5 ML MISC Use as directed 08/11/15   Tresa Garter, MD  INVOKANA 300 MG TABS tablet Take 1 tablet (300 mg total) by mouth daily. 08/10/16   Argentina Donovan, PA-C  lisinopril (PRINIVIL,ZESTRIL) 5 MG tablet Take 1 tablet (5 mg total) by mouth daily. 04/25/16   Maren Reamer, MD  metFORMIN (GLUCOPHAGE XR) 500 MG 24 hr tablet Take 2 tablets (1,000 mg total) by mouth 2 (two) times daily. 08/10/16   Argentina Donovan, PA-C  metoCLOPramide (REGLAN) 5 MG tablet Take 1/2 tablet before a meal. 08/04/16   Esterwood, Amy S, PA-C  naproxen (NAPROSYN) 500 MG tablet Take 1 tablet (500 mg total) by mouth 2 (two) times daily with a meal. 09/15/16   Amao, Charlane Ferretti, MD  NEEDLE, DISP, 24 G (B-D DISP NEEDLE 24GX1") 24G X 1" MISC Use with solostar pen to inject 4 times daily 07/09/14   Angelica Chessman E, MD  nicotine (NICODERM CQ) 21 mg/24hr patch Place 1 patch (21 mg total) onto the skin daily. 04/25/16   Langeland, Dawn T, MD  ondansetron (ZOFRAN ODT) 4 MG disintegrating tablet Take 1 tab, dissolve on the tongue every 6 hours as needed for nausea. 08/04/16    Esterwood, Amy S, PA-C  ondansetron (ZOFRAN) 4 MG tablet Take 1 tablet (4 mg total) by mouth every 6 (six) hours. 07/30/16   Montine Circle, PA-C  pantoprazole (PROTONIX) 40 MG tablet Take 1 tablet (40 mg total) by mouth 2 (two) times daily. 08/04/16   Esterwood, Amy S, PA-C  polyethylene glycol (MIRALAX) packet Take 17 g by mouth daily. 07/28/16   Tasia Catchings, Amy V, PA-C  pravastatin (PRAVACHOL) 20 MG tablet Take 1 tablet (20 mg total) by mouth daily. 04/25/16   Maren Reamer, MD  traMADol (ULTRAM) 50 MG tablet Take 1 tab every 8 hours as needed for  pain. 09/15/16   Arnoldo Morale, MD  zolpidem (AMBIEN) 5 MG tablet Take 5 mg by mouth at bedtime.    [provider]    Family History Family History  Problem Relation Age of Onset  . Diabetes Mother   . Stroke Mother   . Hypertension Mother   . Breast cancer Sister   . Heart attack Sister   . Other Daughter        died at age 57  . Cirrhosis Father   . Colon cancer Maternal Uncle     Social History Social History  Substance Use Topics  . Smoking status: Former Smoker    Packs/day: 0.25    Types: Cigarettes    Quit date: 08/01/2016  . Smokeless tobacco: Never Used  . Alcohol use No     Allergies   Patient has no known allergies.   Review of Systems Review of Systems  Constitutional: Negative for fever.  HENT: Positive for rhinorrhea. Negative for congestion, ear pain and sore throat.   Eyes: Negative for redness.  Respiratory: Positive for cough and wheezing. Negative for shortness of breath.   Cardiovascular: Negative for chest pain.  Gastrointestinal: Negative for abdominal pain, diarrhea, nausea and vomiting.  Genitourinary: Negative for dysuria.  Musculoskeletal: Negative for myalgias.  Skin: Negative for rash.  Neurological: Negative for headaches.     Physical Exam Updated Vital Signs BP 109/82 (BP Location: Left Arm)   Pulse 87   Temp 98.1 F (36.7 C) (Oral)   Resp 16   LMP 01/23/2014 (Approximate)  Comment: no cycle since this date  SpO2 100%   Physical Exam  Constitutional: She appears well-developed and well-nourished.  HENT:  Head: Normocephalic and atraumatic.  Right Ear: Tympanic membrane, external ear and ear canal normal.  Left Ear: Tympanic membrane, external ear and ear canal normal.  Nose: No mucosal edema or rhinorrhea.  Mouth/Throat: Oropharynx is clear and moist. No oropharyngeal exudate, posterior oropharyngeal edema or posterior oropharyngeal erythema.  Eyes: Conjunctivae are normal. Right eye exhibits no discharge. Left eye exhibits no discharge.  Neck: Normal range of motion. Neck supple.  Cardiovascular: Normal rate, regular rhythm and normal heart sounds.   No murmur heard. Pulmonary/Chest: Effort normal. No respiratory distress. She has wheezes (Occasional mild expiratory wheezing). She has no rales.  Abdominal: Soft. There is no tenderness. There is no rebound and no guarding.  Neurological: She is alert.  Skin: Skin is warm and dry.  Psychiatric: She has a normal mood and affect.  Nursing note and vitals reviewed.    ED Treatments / Results  Labs (all labs ordered are listed, but only abnormal results are displayed) Labs Reviewed - No data to display  EKG  EKG Interpretation None       Radiology Dg Chest 2 View  Result Date: 10/05/2016 CLINICAL DATA:  Cough EXAM: CHEST  2 VIEW COMPARISON:  03/12/2016 FINDINGS: Mild diffuse interstitial opacity similar to prior and probably chronic. No acute consolidation or effusion. Normal cardiomediastinal silhouette. No pneumothorax. Degenerative changes of the spine. IMPRESSION: No active cardiopulmonary disease. Electronically Signed   By: Donavan Foil M.D.   On: 10/05/2016 17:54    Procedures Procedures (including critical care time)  Medications Ordered in ED Medications  benzonatate (TESSALON) capsule 100 mg (not administered)  albuterol (PROVENTIL HFA;VENTOLIN HFA) 108 (90 Base) MCG/ACT inhaler 2  puff (not administered)  AEROCHAMBER PLUS FLO-VU SMALL device MISC 1 each (not administered)  albuterol (PROVENTIL) (2.5 MG/3ML) 0.083% nebulizer solution 5 mg (  5 mg Nebulization Given 10/05/16 1700)  ipratropium (ATROVENT) nebulizer solution 0.5 mg (0.5 mg Nebulization Given 10/05/16 1700)     Initial Impression / Assessment and Plan / ED Course  I have reviewed the triage vital signs and the nursing notes.  Pertinent labs & imaging results that were available during my care of the patient were reviewed by me and considered in my medical decision making (see chart for details).     Patient seen and examined. Work-up initiated. Medications ordered.   Vital signs reviewed and are as follows: BP 109/82 (BP Location: Left Arm)   Pulse 87   Temp 98.1 F (36.7 C) (Oral)   Resp 16   LMP 01/23/2014 (Approximate) Comment: no cycle since this date  SpO2 100%   Plan breathing treatment, chest x-ray. Anticipate discharge to home with conservative measures.  6:47 PM patient states that her breathing feels better after treatment, however she still has mucus sensation in her chest. Will give patient a dose of Tessalon prior to discharge. Home with Tessalon and albuterol inhaler.  Encourage patient to return with fever, worsening difficulty breathing, chest pains, new symptoms or other concerns.  Final Clinical Impressions(s) / ED Diagnoses   Final diagnoses:  Cough  Acute bronchitis, unspecified organism   Patient with history of cough and wheezing. Suspect acute bronchitis. Chest x-ray is clear without signs of pneumonia. She has not yet maximized therapy for cough. At this point, even with history of diabetes, do not feel that she needs course of antibiotics unless symptoms persist or worsen. No fever. Patient appears well. No hypoxia.   New Prescriptions New Prescriptions   No medications on file     Carlisle Cater, Hershal Coria 10/05/16 [place], MD 10/07/16 [phone]

## 2016-10-09 ENCOUNTER — Ambulatory Visit: Payer: Medicaid Other | Admitting: Internal Medicine

## 2016-10-16 ENCOUNTER — Telehealth: Payer: Self-pay | Admitting: Family Medicine

## 2016-10-16 NOTE — Telephone Encounter (Signed)
Pt came to the office to request a refill for  traMADol (ULTRAM) 50 MG tablet  Please follow up

## 2016-10-17 NOTE — Telephone Encounter (Signed)
Pt was called and informed she needs an OV to get refill on medication. Pt has appointment on 10/18/16.

## 2016-10-17 NOTE — Telephone Encounter (Signed)
She was to follow up with me in 1 month which happens to be about now. I will address that then.

## 2016-10-18 ENCOUNTER — Ambulatory Visit: Payer: Medicaid Other | Attending: Family Medicine | Admitting: Family Medicine

## 2016-10-18 ENCOUNTER — Encounter: Payer: Self-pay | Admitting: Family Medicine

## 2016-10-18 VITALS — BP 119/74 | HR 89 | Temp 98.1°F | Ht 66.0 in | Wt 304.8 lb

## 2016-10-18 DIAGNOSIS — Z7982 Long term (current) use of aspirin: Secondary | ICD-10-CM | POA: Diagnosis not present

## 2016-10-18 DIAGNOSIS — K449 Diaphragmatic hernia without obstruction or gangrene: Secondary | ICD-10-CM | POA: Diagnosis not present

## 2016-10-18 DIAGNOSIS — M25562 Pain in left knee: Secondary | ICD-10-CM | POA: Insufficient documentation

## 2016-10-18 DIAGNOSIS — G4730 Sleep apnea, unspecified: Secondary | ICD-10-CM | POA: Insufficient documentation

## 2016-10-18 DIAGNOSIS — M1990 Unspecified osteoarthritis, unspecified site: Secondary | ICD-10-CM

## 2016-10-18 DIAGNOSIS — J45909 Unspecified asthma, uncomplicated: Secondary | ICD-10-CM | POA: Insufficient documentation

## 2016-10-18 DIAGNOSIS — M545 Low back pain: Secondary | ICD-10-CM | POA: Insufficient documentation

## 2016-10-18 DIAGNOSIS — Z9851 Tubal ligation status: Secondary | ICD-10-CM | POA: Diagnosis not present

## 2016-10-18 DIAGNOSIS — E669 Obesity, unspecified: Secondary | ICD-10-CM | POA: Diagnosis not present

## 2016-10-18 DIAGNOSIS — E1143 Type 2 diabetes mellitus with diabetic autonomic (poly)neuropathy: Secondary | ICD-10-CM | POA: Diagnosis not present

## 2016-10-18 DIAGNOSIS — I10 Essential (primary) hypertension: Secondary | ICD-10-CM | POA: Insufficient documentation

## 2016-10-18 DIAGNOSIS — Z794 Long term (current) use of insulin: Secondary | ICD-10-CM | POA: Diagnosis not present

## 2016-10-18 DIAGNOSIS — M1710 Unilateral primary osteoarthritis, unspecified knee: Secondary | ICD-10-CM | POA: Diagnosis present

## 2016-10-18 DIAGNOSIS — G8929 Other chronic pain: Secondary | ICD-10-CM | POA: Diagnosis not present

## 2016-10-18 DIAGNOSIS — F329 Major depressive disorder, single episode, unspecified: Secondary | ICD-10-CM | POA: Diagnosis not present

## 2016-10-18 DIAGNOSIS — Z9049 Acquired absence of other specified parts of digestive tract: Secondary | ICD-10-CM | POA: Insufficient documentation

## 2016-10-18 DIAGNOSIS — M25561 Pain in right knee: Secondary | ICD-10-CM | POA: Insufficient documentation

## 2016-10-18 DIAGNOSIS — E1169 Type 2 diabetes mellitus with other specified complication: Secondary | ICD-10-CM | POA: Diagnosis not present

## 2016-10-18 DIAGNOSIS — F419 Anxiety disorder, unspecified: Secondary | ICD-10-CM | POA: Diagnosis not present

## 2016-10-18 DIAGNOSIS — M549 Dorsalgia, unspecified: Secondary | ICD-10-CM | POA: Insufficient documentation

## 2016-10-18 DIAGNOSIS — Z9889 Other specified postprocedural states: Secondary | ICD-10-CM | POA: Insufficient documentation

## 2016-10-18 DIAGNOSIS — K3184 Gastroparesis: Secondary | ICD-10-CM | POA: Diagnosis not present

## 2016-10-18 DIAGNOSIS — K219 Gastro-esophageal reflux disease without esophagitis: Secondary | ICD-10-CM | POA: Insufficient documentation

## 2016-10-18 MED ORDER — NAPROXEN 500 MG PO TABS: 500.0000 mg | ORAL_TABLET | Freq: Two times a day (BID) | ORAL | 2 refills | Status: DC

## 2016-10-18 MED ORDER — METHOCARBAMOL 500 MG PO TABS: 500.0000 mg | ORAL_TABLET | Freq: Three times a day (TID) | ORAL | 2 refills | Status: DC | PRN

## 2016-10-18 NOTE — Progress Notes (Signed)
Subjective:  Patient ID: Tara Keller, female    DOB: 04/01/1965  Age: 51 y.o. MRN: [id number]  CC: Medication Refill   HPI Billee Balcerzak Flegel  is a 51 year old female with a history of uncontrolled type 2 diabetes mellitus (A1c 13.9), diabetic neuropathy, GERD, gastroparesis, depression and anxiety who presents today with complains of bilateral knee pain and back pain which is chronic. Symptoms are worse in the mornings and increase with the rain. She describes this as "my whole body aches". She received a few tramadol pills at her last office visit which helped and she is requesting refills today. Naproxen appears in her med list however she does not take this because it causes abdominal pain but she has been taking it without food.  With regards to her blood sugars she informs me her fasting was 119 this morning and she has not had sugars above 250.  Past Medical History:  Diagnosis Date  . Anxiety   . Arthritis   . Asthma   . Depression   . Diabetes mellitus   . Fatty liver   . Fracture closed of upper end of forearm 9 years, Fx left left leg  . Fracture of left lower leg @ 51 years old  . H/O tubal ligation   . Hiatal hernia   . Hypertension   . Obesity   . Positive H. pylori test   . Sleep apnea    uses CPAP  . Tubular adenoma of colon     Past Surgical History:  Procedure Laterality Date  . CHOLECYSTECTOMY    . COLONOSCOPY WITH PROPOFOL N/A 08/15/2013   Procedure: COLONOSCOPY WITH PROPOFOL;  Surgeon: Jerene Bears, MD;  Location: [facility];  Service: Gastroenterology;  Laterality: N/A;  . ESOPHAGOGASTRODUODENOSCOPY N/A 05/12/2014   Procedure: ESOPHAGOGASTRODUODENOSCOPY (EGD);  Surgeon: Jerene Bears, MD;  Location: [facility];  Service: Gastroenterology;  Laterality: N/A;  . ESOPHAGOGASTRODUODENOSCOPY (EGD) WITH PROPOFOL N/A 08/15/2013   Procedure: ESOPHAGOGASTRODUODENOSCOPY (EGD) WITH PROPOFOL;  Surgeon: Jerene Bears, MD;  Location: [facility];  Service:  Gastroenterology;  Laterality: N/A;  . TUBAL LIGATION Bilateral     No Known Allergies   Outpatient Medications Prior to Visit  Medication Sig Dispense Refill  . ACCU-CHEK AVIVA PLUS test strip TEST three times a day 100 each 12  . ACCU-CHEK FASTCLIX LANCETS MISC Use as directed 100 each 12  . albuterol (PROVENTIL HFA;VENTOLIN HFA) 108 (90 Base) MCG/ACT inhaler Inhale 1-2 puffs into the lungs every 6 (six) hours as needed for wheezing or shortness of breath. 1 Inhaler 0  . aspirin EC 81 MG tablet Take 1 tablet (81 mg total) by mouth daily. 90 tablet 3  . Blood Glucose Monitoring Suppl (ACCU-CHEK AVIVA PLUS) w/Device KIT 1 each by Does not apply route 3 (three) times daily. 1 kit 0  . buPROPion (WELLBUTRIN XL) 150 MG 24 hr tablet Take 450 mg by mouth daily.    . clonazePAM (KLONOPIN) 1 MG tablet Take 1 mg by mouth at bedtime.    . docusate sodium (COLACE) 50 MG capsule Take 1 capsule (50 mg total) by mouth 2 (two) times daily. 10 capsule 0  . gabapentin (NEURONTIN) 300 MG capsule Take 1 capsule (300 mg total) by mouth at bedtime. 30 capsule 3  . insulin aspart (NOVOLOG) 100 UNIT/ML FlexPen Inject 0-12 Units into the skin 3 (three) times daily with meals. As per sliding scale 15 mL 6  . Insulin Glargine (LANTUS SOLOSTAR) 100 UNIT/ML Solostar Pen Inject  60 Units into the skin daily at 10 pm. 5 pen 3  . Insulin Pen Needle 31G X 8 MM MISC Use as directed for 4 times daily insulin administration. 100 each 12  . Insulin Syringe-Needle U-100 (BD INSULIN SYRINGE ULTRAFINE) 31G X 15/64" 0.5 ML MISC Use as directed 100 each 5  . INVOKANA 300 MG TABS tablet Take 1 tablet (300 mg total) by mouth daily. 90 tablet 3  . lisinopril (PRINIVIL,ZESTRIL) 5 MG tablet Take 1 tablet (5 mg total) by mouth daily. 90 tablet 3  . metFORMIN (GLUCOPHAGE XR) 500 MG 24 hr tablet Take 2 tablets (1,000 mg total) by mouth 2 (two) times daily. 120 tablet 2  . metoCLOPramide (REGLAN) 5 MG tablet Take 1/2 tablet before a meal.  90 tablet 0  . NEEDLE, DISP, 24 G (B-D DISP NEEDLE 24GX1") 24G X 1" MISC Use with solostar pen to inject 4 times daily 200 each 1  . nicotine (NICODERM CQ) 21 mg/24hr patch Place 1 patch (21 mg total) onto the skin daily. 28 patch 0  . ondansetron (ZOFRAN ODT) 4 MG disintegrating tablet Take 1 tab, dissolve on the tongue every 6 hours as needed for nausea. 100 tablet 0  . pantoprazole (PROTONIX) 40 MG tablet Take 1 tablet (40 mg total) by mouth 2 (two) times daily. 60 tablet 6  . polyethylene glycol (MIRALAX) packet Take 17 g by mouth daily. 14 each 0  . pravastatin (PRAVACHOL) 20 MG tablet Take 1 tablet (20 mg total) by mouth daily. 90 tablet 3  . traMADol (ULTRAM) 50 MG tablet Take 1 tab every 8 hours as needed for pain. 15 tablet 0  . zolpidem (AMBIEN) 5 MG tablet Take 5 mg by mouth at bedtime.    . DULoxetine (CYMBALTA) 60 MG capsule Take 60 mg by mouth 2 (two) times daily.     . benzonatate (TESSALON) 100 MG capsule Take 1 capsule (100 mg total) by mouth every 8 (eight) hours. (Patient not taking: Reported on 10/18/2016) 15 capsule 0  . bisacodyl (DULCOLAX) 10 MG suppository Place 1 suppository (10 mg total) rectally as needed for moderate constipation. (Patient not taking: Reported on 10/18/2016) 4 suppository 0  . ondansetron (ZOFRAN) 4 MG tablet Take 1 tablet (4 mg total) by mouth every 6 (six) hours. (Patient not taking: Reported on 10/18/2016) 12 tablet 0  . naproxen (NAPROSYN) 500 MG tablet Take 1 tablet (500 mg total) by mouth 2 (two) times daily with a meal. (Patient not taking: Reported on 10/18/2016) 30 tablet 0   No facility-administered medications prior to visit.     ROS Review of Systems  Constitutional: Negative for activity change, appetite change and fatigue.  HENT: Negative for congestion, sinus pressure and sore throat.   Eyes: Negative for visual disturbance.  Respiratory: Negative for cough, chest tightness, shortness of breath and wheezing.   Cardiovascular:  Negative for chest pain and palpitations.  Gastrointestinal: Negative for abdominal distention, abdominal pain and constipation.  Endocrine: Negative for polydipsia.  Genitourinary: Negative for dysuria and frequency.  Musculoskeletal: Positive for arthralgias and back pain.  Skin: Negative for rash.  Neurological: Negative for tremors, light-headedness and numbness.  Hematological: Does not bruise/bleed easily.  Psychiatric/Behavioral: Negative for agitation and behavioral problems.    Objective:  BP 119/74   Pulse 89   Temp 98.1 F (36.7 C) (Oral)   Ht 5' 6" (1.676 m)   Wt (!) 304 lb 12.8 oz (138.3 kg)   LMP 01/23/2014 (Approximate) Comment: no cycle  since this date  SpO2 97%   BMI 49.20 kg/m   BP/Weight 10/18/2016 7/00/1749 04/12/9673  Systolic BP 916 384 665  Diastolic BP 74 88 74  Wt. (Lbs) 304.8 - 299.6  BMI 49.2 - 48.36      Physical Exam  Constitutional: She is oriented to person, place, and time. She appears well-developed and well-nourished.  Cardiovascular: Normal rate, normal heart sounds and intact distal pulses.   No murmur heard. Pulmonary/Chest: Effort normal and breath sounds normal. She has no wheezes. She has no rales. She exhibits no tenderness.  Abdominal: Soft. Bowel sounds are normal. She exhibits no distension and no mass. There is no tenderness.  Musculoskeletal: Normal range of motion. She exhibits tenderness (tenderness and crepitus on range of motion of both knees).  Neurological: She is alert and oriented to person, place, and time.     Lab Results  Component Value Date   HGBA1C 13.9 (H) 08/04/2016    Assessment & Plan:   1. Arthritis Osteoarthritis of the knee She is not currently taking an NSAID which I have resumed-Advised to take this with food We have discussed limits imposed on tramadol by Medicaid due to the fact that it is a controlled substance If symptoms do not improve I will place her back on tramadol We'll also consider an  intra-articular corticosteroid injections - naproxen (NAPROSYN) 500 MG tablet; Take 1 tablet (500 mg total) by mouth 2 (two) times daily with a meal.  Dispense: 60 tablet; Refill: 2  2. Chronic midline low back pain without sciatica Advised to apply heat - methocarbamol (ROBAXIN) 500 MG tablet; Take 1 tablet (500 mg total) by mouth every 8 (eight) hours as needed for muscle spasms.  Dispense: 90 tablet; Refill: 2  3. Type 2 diabetes mellitus with other specified complication, with long-term current use of insulin (HCC) Uncontrolled with A1c of 13.9 Blood sugar logs reveal improvement No regimen change at this time.   Meds ordered this encounter  Medications  . naproxen (NAPROSYN) 500 MG tablet    Sig: Take 1 tablet (500 mg total) by mouth 2 (two) times daily with a meal.    Dispense:  60 tablet    Refill:  2  . methocarbamol (ROBAXIN) 500 MG tablet    Sig: Take 1 tablet (500 mg total) by mouth every 8 (eight) hours as needed for muscle spasms.    Dispense:  90 tablet    Refill:  2    Follow-up: Return in about 5 weeks (around 11/22/2016) for Follow-up of arthritis.   Arnoldo Morale MD

## 2016-10-24 ENCOUNTER — Telehealth: Payer: Self-pay | Admitting: Family Medicine

## 2016-10-24 MED ORDER — GLUCOSE BLOOD VI STRP: ORAL_STRIP | 12 refills | Status: DC

## 2016-10-24 NOTE — Telephone Encounter (Signed)
Pt called to request a refill for  ACCU-CHEK AVIVA PLUS test strip   Please sent it to [facility] Please follow up

## 2016-10-24 NOTE — Telephone Encounter (Signed)
Refilled

## 2016-11-03 ENCOUNTER — Other Ambulatory Visit: Payer: Self-pay

## 2016-11-03 DIAGNOSIS — E1169 Type 2 diabetes mellitus with other specified complication: Secondary | ICD-10-CM

## 2016-11-03 DIAGNOSIS — E1149 Type 2 diabetes mellitus with other diabetic neurological complication: Secondary | ICD-10-CM

## 2016-11-03 MED ORDER — LISINOPRIL 5 MG PO TABS: 5.0000 mg | ORAL_TABLET | Freq: Every day | ORAL | 1 refills | Status: DC

## 2016-11-03 MED ORDER — INVOKANA 300 MG PO TABS: 300.0000 mg | ORAL_TABLET | Freq: Every day | ORAL | 1 refills | Status: DC

## 2016-11-03 MED ORDER — PRAVASTATIN SODIUM 20 MG PO TABS: 20.0000 mg | ORAL_TABLET | Freq: Every day | ORAL | 1 refills | Status: DC

## 2016-11-03 MED ORDER — GABAPENTIN 300 MG PO CAPS: 300.0000 mg | ORAL_CAPSULE | Freq: Every day | ORAL | 1 refills | Status: DC

## 2016-11-06 ENCOUNTER — Telehealth: Payer: Self-pay | Admitting: Family Medicine

## 2016-11-06 ENCOUNTER — Other Ambulatory Visit: Payer: Self-pay | Admitting: Pharmacist

## 2016-11-06 MED ORDER — INSULIN PEN NEEDLE 31G X 8 MM MISC: 12 refills | Status: DC

## 2016-11-06 NOTE — Telephone Encounter (Signed)
Pt called to request a refill for NEEDLE, DISP, 24 G (B-D DISP NEEDLE 24GX1") 24G X 1" MISC  She want the refill to be sent [facility]

## 2016-11-07 MED ORDER — INSULIN PEN NEEDLE 31G X 8 MM MISC: 12 refills | Status: DC

## 2016-11-07 NOTE — Telephone Encounter (Signed)
Refilled

## 2016-11-22 ENCOUNTER — Encounter: Payer: Self-pay | Admitting: Family Medicine

## 2016-11-22 ENCOUNTER — Ambulatory Visit: Payer: Medicaid Other | Attending: Family Medicine | Admitting: Family Medicine

## 2016-11-22 VITALS — BP 107/70 | HR 85 | Temp 97.6°F | Ht 66.0 in | Wt 309.8 lb

## 2016-11-22 DIAGNOSIS — J45909 Unspecified asthma, uncomplicated: Secondary | ICD-10-CM | POA: Insufficient documentation

## 2016-11-22 DIAGNOSIS — Z6841 Body Mass Index (BMI) 40.0 and over, adult: Secondary | ICD-10-CM | POA: Insufficient documentation

## 2016-11-22 DIAGNOSIS — M1990 Unspecified osteoarthritis, unspecified site: Secondary | ICD-10-CM

## 2016-11-22 DIAGNOSIS — I10 Essential (primary) hypertension: Secondary | ICD-10-CM | POA: Diagnosis not present

## 2016-11-22 DIAGNOSIS — Z8781 Personal history of (healed) traumatic fracture: Secondary | ICD-10-CM | POA: Insufficient documentation

## 2016-11-22 DIAGNOSIS — K219 Gastro-esophageal reflux disease without esophagitis: Secondary | ICD-10-CM | POA: Insufficient documentation

## 2016-11-22 DIAGNOSIS — Z79899 Other long term (current) drug therapy: Secondary | ICD-10-CM | POA: Diagnosis not present

## 2016-11-22 DIAGNOSIS — B3789 Other sites of candidiasis: Secondary | ICD-10-CM | POA: Insufficient documentation

## 2016-11-22 DIAGNOSIS — F419 Anxiety disorder, unspecified: Secondary | ICD-10-CM | POA: Insufficient documentation

## 2016-11-22 DIAGNOSIS — E1140 Type 2 diabetes mellitus with diabetic neuropathy, unspecified: Secondary | ICD-10-CM | POA: Insufficient documentation

## 2016-11-22 DIAGNOSIS — F329 Major depressive disorder, single episode, unspecified: Secondary | ICD-10-CM | POA: Insufficient documentation

## 2016-11-22 DIAGNOSIS — Z9049 Acquired absence of other specified parts of digestive tract: Secondary | ICD-10-CM | POA: Insufficient documentation

## 2016-11-22 DIAGNOSIS — K760 Fatty (change of) liver, not elsewhere classified: Secondary | ICD-10-CM | POA: Insufficient documentation

## 2016-11-22 DIAGNOSIS — E1143 Type 2 diabetes mellitus with diabetic autonomic (poly)neuropathy: Secondary | ICD-10-CM | POA: Diagnosis not present

## 2016-11-22 DIAGNOSIS — Z7982 Long term (current) use of aspirin: Secondary | ICD-10-CM | POA: Diagnosis not present

## 2016-11-22 DIAGNOSIS — E119 Type 2 diabetes mellitus without complications: Secondary | ICD-10-CM | POA: Diagnosis present

## 2016-11-22 DIAGNOSIS — M545 Low back pain, unspecified: Secondary | ICD-10-CM

## 2016-11-22 DIAGNOSIS — E1169 Type 2 diabetes mellitus with other specified complication: Secondary | ICD-10-CM | POA: Diagnosis not present

## 2016-11-22 DIAGNOSIS — G8929 Other chronic pain: Secondary | ICD-10-CM | POA: Diagnosis not present

## 2016-11-22 DIAGNOSIS — Z794 Long term (current) use of insulin: Secondary | ICD-10-CM | POA: Diagnosis not present

## 2016-11-22 DIAGNOSIS — Z86010 Personal history of colonic polyps: Secondary | ICD-10-CM | POA: Diagnosis not present

## 2016-11-22 DIAGNOSIS — K3184 Gastroparesis: Secondary | ICD-10-CM | POA: Insufficient documentation

## 2016-11-22 DIAGNOSIS — E1149 Type 2 diabetes mellitus with other diabetic neurological complication: Secondary | ICD-10-CM

## 2016-11-22 DIAGNOSIS — G4730 Sleep apnea, unspecified: Secondary | ICD-10-CM | POA: Insufficient documentation

## 2016-11-22 DIAGNOSIS — F32A Depression, unspecified: Secondary | ICD-10-CM

## 2016-11-22 LAB — POCT GLYCOSYLATED HEMOGLOBIN (HGB A1C): Hemoglobin A1C: 8.3

## 2016-11-22 LAB — GLUCOSE, POCT (MANUAL RESULT ENTRY): POC Glucose: 103 mg/dl — AB (ref 70–99)

## 2016-11-22 MED ORDER — INSULIN GLARGINE 100 UNIT/ML SOLOSTAR PEN: 65.0000 [IU] | PEN_INJECTOR | Freq: Every day | SUBCUTANEOUS | 3 refills | Status: DC

## 2016-11-22 MED ORDER — METHOCARBAMOL 500 MG PO TABS: 500.0000 mg | ORAL_TABLET | Freq: Three times a day (TID) | ORAL | 2 refills | Status: DC | PRN

## 2016-11-22 MED ORDER — PANTOPRAZOLE SODIUM 40 MG PO TBEC: 40.0000 mg | DELAYED_RELEASE_TABLET | Freq: Every day | ORAL | 6 refills | Status: DC

## 2016-11-22 MED ORDER — METOCLOPRAMIDE HCL 5 MG PO TABS: ORAL_TABLET | ORAL | 0 refills | Status: DC

## 2016-11-22 MED ORDER — TRAMADOL HCL 50 MG PO TABS: ORAL_TABLET | ORAL | 1 refills | Status: DC

## 2016-11-22 MED ORDER — METFORMIN HCL ER 500 MG PO TB24: 1000.0000 mg | ORAL_TABLET | Freq: Two times a day (BID) | ORAL | 2 refills | Status: DC

## 2016-11-22 MED ORDER — NYSTATIN 100000 UNIT/GM EX POWD: Freq: Two times a day (BID) | CUTANEOUS | 0 refills | Status: DC

## 2016-11-22 MED ORDER — GABAPENTIN 300 MG PO CAPS: 300.0000 mg | ORAL_CAPSULE | Freq: Every day | ORAL | 1 refills | Status: DC

## 2016-11-22 MED ORDER — LISINOPRIL 5 MG PO TABS: 5.0000 mg | ORAL_TABLET | Freq: Every day | ORAL | 1 refills | Status: DC

## 2016-11-22 MED ORDER — INVOKANA 300 MG PO TABS: 300.0000 mg | ORAL_TABLET | Freq: Every day | ORAL | 1 refills | Status: DC

## 2016-11-22 NOTE — Progress Notes (Signed)
Subjective:  Patient ID: Tara Keller, female    DOB: [birth year]  Age: 51 y.o. MRN: [id number]  CC: Diabetes and Osteoarthritis   HPI Tara Keller is a 51 year old female with a history of uncontrolled type 2 diabetes mellitus (A1c 8.3 down from 13.9), diabetic neuropathy, GERD, gastroparesis, depression and anxiety who presents today for follow-up of bilateral knee pain and back pain which is chronic. Symptoms are worse in the mornings and increase with the rain.  I had prescribed her naproxen at her last visit but she reports no improvement in symptoms.  Pain is worse in the morning and is described as a 10/10.  With regards to her diabetes she has had significant improvement with a downward trend of her A1c from 13.9 to 8.3.  She denies hypoglycemia.  She remains on gabapentin for her neuropathy and this is controlled. Reflux symptoms have been stable. She remains on metoclopramide for diabetic gastroparesis.  Anxiety and depression have been controlled and she denies suicidal ideations or intents.  She has a rash beneath both breasts which is pruritic and received nystatin powder in the past which helped.  Also requests a refill of her anxiety and depression medications as her current psychiatry practice closed up.  Past Medical History:  Diagnosis Date  . Anxiety   . Arthritis   . Asthma   . Depression   . Diabetes mellitus   . Fatty liver   . Fracture closed of upper end of forearm 9 years, Fx left left leg  . Fracture of left lower leg @ 51 years old  . H/O tubal ligation   . Hiatal hernia   . Hypertension   . Obesity   . Positive H. pylori test   . Sleep apnea    uses CPAP  . Tubular adenoma of colon     Past Surgical History:  Procedure Laterality Date  . CHOLECYSTECTOMY    . COLONOSCOPY WITH PROPOFOL N/A 08/15/2013   Procedure: COLONOSCOPY WITH PROPOFOL;  Surgeon: Jerene Bears, MD;  Location: [facility];  Service: Gastroenterology;  Laterality: N/A;  .  ESOPHAGOGASTRODUODENOSCOPY N/A 05/12/2014   Procedure: ESOPHAGOGASTRODUODENOSCOPY (EGD);  Surgeon: Jerene Bears, MD;  Location: [facility];  Service: Gastroenterology;  Laterality: N/A;  . ESOPHAGOGASTRODUODENOSCOPY (EGD) WITH PROPOFOL N/A 08/15/2013   Procedure: ESOPHAGOGASTRODUODENOSCOPY (EGD) WITH PROPOFOL;  Surgeon: Jerene Bears, MD;  Location: [facility];  Service: Gastroenterology;  Laterality: N/A;  . TUBAL LIGATION Bilateral     No Known Allergies   Outpatient Medications Prior to Visit  Medication Sig Dispense Refill  . ACCU-CHEK FASTCLIX LANCETS MISC Use as directed 100 each 12  . albuterol (PROVENTIL HFA;VENTOLIN HFA) 108 (90 Base) MCG/ACT inhaler Inhale 1-2 puffs into the lungs every 6 (six) hours as needed for wheezing or shortness of breath. 1 Inhaler 0  . aspirin EC 81 MG tablet Take 1 tablet (81 mg total) by mouth daily. 90 tablet 3  . bisacodyl (DULCOLAX) 10 MG suppository Place 1 suppository (10 mg total) rectally as needed for moderate constipation. 4 suppository 0  . Blood Glucose Monitoring Suppl (ACCU-CHEK AVIVA PLUS) w/Device KIT 1 each by Does not apply route 3 (three) times daily. 1 kit 0  . clonazePAM (KLONOPIN) 1 MG tablet Take 1 mg by mouth at bedtime.    . docusate sodium (COLACE) 50 MG capsule Take 1 capsule (50 mg total) by mouth 2 (two) times daily. 10 capsule 0  . glucose blood (ACCU-CHEK AVIVA PLUS) test strip TEST  three times a day. E11.9 100 each 12  . insulin aspart (NOVOLOG) 100 UNIT/ML FlexPen Inject 0-12 Units into the skin 3 (three) times daily with meals. As per sliding scale 15 mL 6  . Insulin Pen Needle 31G X 8 MM MISC Use as directed for 4 times daily insulin administration. 100 each 12  . Insulin Syringe-Needle U-100 (BD INSULIN SYRINGE ULTRAFINE) 31G X 15/64" 0.5 ML MISC Use as directed 100 each 5  . naproxen (NAPROSYN) 500 MG tablet Take 1 tablet (500 mg total) by mouth 2 (two) times daily with a meal. 60 tablet 2  . NEEDLE, DISP, 24 G (B-D DISP  NEEDLE 24GX1") 24G X 1" MISC Use with solostar pen to inject 4 times daily 200 each 1  . polyethylene glycol (MIRALAX) packet Take 17 g by mouth daily. 14 each 0  . pravastatin (PRAVACHOL) 20 MG tablet Take 1 tablet (20 mg total) by mouth daily. 90 tablet 1  . zolpidem (AMBIEN) 5 MG tablet Take 5 mg by mouth at bedtime.    . gabapentin (NEURONTIN) 300 MG capsule Take 1 capsule (300 mg total) by mouth at bedtime. 30 capsule 1  . Insulin Glargine (LANTUS SOLOSTAR) 100 UNIT/ML Solostar Pen Inject 60 Units into the skin daily at 10 pm. 5 pen 3  . INVOKANA 300 MG TABS tablet Take 1 tablet (300 mg total) by mouth daily. 90 tablet 1  . lisinopril (PRINIVIL,ZESTRIL) 5 MG tablet Take 1 tablet (5 mg total) by mouth daily. 90 tablet 1  . metFORMIN (GLUCOPHAGE XR) 500 MG 24 hr tablet Take 2 tablets (1,000 mg total) by mouth 2 (two) times daily. 120 tablet 2  . methocarbamol (ROBAXIN) 500 MG tablet Take 1 tablet (500 mg total) by mouth every 8 (eight) hours as needed for muscle spasms. 90 tablet 2  . metoCLOPramide (REGLAN) 5 MG tablet Take 1/2 tablet before a meal. 90 tablet 0  . ondansetron (ZOFRAN ODT) 4 MG disintegrating tablet Take 1 tab, dissolve on the tongue every 6 hours as needed for nausea. 100 tablet 0  . pantoprazole (PROTONIX) 40 MG tablet Take 1 tablet (40 mg total) by mouth 2 (two) times daily. 60 tablet 6  . buPROPion (WELLBUTRIN XL) 150 MG 24 hr tablet Take 450 mg by mouth daily.    . nicotine (NICODERM CQ) 21 mg/24hr patch Place 1 patch (21 mg total) onto the skin daily. (Patient not taking: Reported on 11/22/2016) 28 patch 0  . benzonatate (TESSALON) 100 MG capsule Take 1 capsule (100 mg total) by mouth every 8 (eight) hours. (Patient not taking: Reported on 10/18/2016) 15 capsule 0  . ondansetron (ZOFRAN) 4 MG tablet Take 1 tablet (4 mg total) by mouth every 6 (six) hours. (Patient not taking: Reported on 10/18/2016) 12 tablet 0  . traMADol (ULTRAM) 50 MG tablet Take 1 tab every 8 hours as  needed for pain. (Patient not taking: Reported on 11/22/2016) 15 tablet 0   No facility-administered medications prior to visit.     ROS Review of Systems  Constitutional: Negative for activity change, appetite change and fatigue.  HENT: Negative for congestion, sinus pressure and sore throat.   Eyes: Negative for visual disturbance.  Respiratory: Negative for cough, chest tightness, shortness of breath and wheezing.   Cardiovascular: Negative for chest pain and palpitations.  Gastrointestinal: Negative for abdominal distention, abdominal pain and constipation.  Endocrine: Negative for polydipsia.  Genitourinary: Negative for dysuria and frequency.  Musculoskeletal:       See  hpi  Skin: Positive for rash.  Neurological: Negative for tremors, light-headedness and numbness.  Hematological: Does not bruise/bleed easily.  Psychiatric/Behavioral: Negative for agitation and behavioral problems.    Objective:  BP 107/70   Pulse 85   Temp 97.6 F (36.4 C) (Oral)   Ht _0  (1.676 m)   Wt (!) 309 lb 12.8 oz (140.5 kg)   LMP 01/23/2014 (Approximate) Comment: no cycle since this date  SpO2 98%   BMI 50.00 kg/m   BP/Weight 11/22/2016 10/18/2016 05/14/1831  Systolic BP 582 518 984  Diastolic BP 70 74 88  Wt. (Lbs) 309.8 304.8 -  BMI 50 49.2 -      Physical Exam  Constitutional: She is oriented to person, place, and time. She appears well-developed and well-nourished.  Cardiovascular: Normal rate, normal heart sounds and intact distal pulses.  No murmur heard. Pulmonary/Chest: Effort normal and breath sounds normal. She has no wheezes. She has no rales. She exhibits no tenderness.  Abdominal: Soft. Bowel sounds are normal. She exhibits no distension and no mass. There is no tenderness.  Musculoskeletal:       Right knee: She exhibits decreased range of motion. She exhibits no swelling. Tenderness found.       Left knee: She exhibits decreased range of motion. She exhibits no  swelling. Tenderness found.  Neurological: She is alert and oriented to person, place, and time.  Skin:  Candida infection beneath both breast  Psychiatric: She has a normal mood and affect.     Lab Results  Component Value Date   HGBA1C 8.3 11/22/2016    Assessment & Plan:   1. Other diabetic neurological complication associated with type 2 diabetes mellitus (HCC) Controlled on gabapentin - gabapentin (NEURONTIN) 300 MG capsule; Take 1 capsule (300 mg total) at bedtime by mouth.  Dispense: 30 capsule; Refill: 1  2. Type 2 diabetes mellitus with other specified complication, without long-term current use of insulin (HCC) Improved with A1c of 8.3 which has trended down from 13.9 previously Increase Lantus dose Diabetic diet, lifestyle modifications - POCT glucose (manual entry) - POCT glycosylated hemoglobin (Hb A1C) - Insulin Glargine (LANTUS SOLOSTAR) 100 UNIT/ML Solostar Pen; Inject 65 Units daily at 10 pm into the skin.  Dispense: 5 pen; Refill: 3 - INVOKANA 300 MG TABS tablet; Take 1 tablet (300 mg total) daily by mouth.  Dispense: 90 tablet; Refill: 1 - metFORMIN (GLUCOPHAGE XR) 500 MG 24 hr tablet; Take 2 tablets (1,000 mg total) 2 (two) times daily by mouth.  Dispense: 120 tablet; Refill: 2 - metoCLOPramide (REGLAN) 5 MG tablet; Take 1 tablet before a meal.  Dispense: 90 tablet; Refill: 0  3. Chronic midline low back pain without sciatica Stable - methocarbamol (ROBAXIN) 500 MG tablet; Take 1 tablet (500 mg total) every 8 (eight) hours as needed by mouth for muscle spasms.  Dispense: 90 tablet; Refill: 2  4. Arthritis Uncontrolled on NSAIDs We will add tramadol to use as needed Use knee brace - traMADol (ULTRAM) 50 MG tablet; Take 1 tab every 12 hours as needed for pain.  Dispense: 60 tablet; Refill: 1  5. Gastroesophageal reflux disease without esophagitis Controlled Reducing her twice daily dosing of Protonix to once daily dosing. - pantoprazole (PROTONIX) 40 MG  tablet; Take 1 tablet (40 mg total) daily by mouth.  Dispense: 30 tablet; Refill: 6  6. Morbid obesity ([place]) Advised to work on reducing caloric intake, exercising as tolerated  7. Candidiasis of breast - nystatin (NYSTATIN) powder; Apply  2 (two) times daily topically.  Dispense: 45 g; Refill: 0  8.  Anxiety and depression Med list reveals she is on Klonopin and Wellbutrin I have refilled Wellbutrin but informed her that I will be unable to refill Klonopin due to the fact that it is a controlled medication I am referring her to psychiatry Meds ordered this encounter  Medications  . gabapentin (NEURONTIN) 300 MG capsule    Sig: Take 1 capsule (300 mg total) at bedtime by mouth.    Dispense:  30 capsule    Refill:  1  . Insulin Glargine (LANTUS SOLOSTAR) 100 UNIT/ML Solostar Pen    Sig: Inject 65 Units daily at 10 pm into the skin.    Dispense:  5 pen    Refill:  3    Discontinue previous dose  . INVOKANA 300 MG TABS tablet    Sig: Take 1 tablet (300 mg total) daily by mouth.    Dispense:  90 tablet    Refill:  1  . lisinopril (PRINIVIL,ZESTRIL) 5 MG tablet    Sig: Take 1 tablet (5 mg total) daily by mouth.    Dispense:  90 tablet    Refill:  1  . metFORMIN (GLUCOPHAGE XR) 500 MG 24 hr tablet    Sig: Take 2 tablets (1,000 mg total) 2 (two) times daily by mouth.    Dispense:  120 tablet    Refill:  2  . methocarbamol (ROBAXIN) 500 MG tablet    Sig: Take 1 tablet (500 mg total) every 8 (eight) hours as needed by mouth for muscle spasms.    Dispense:  90 tablet    Refill:  2  . metoCLOPramide (REGLAN) 5 MG tablet    Sig: Take 1 tablet before a meal.    Dispense:  90 tablet    Refill:  0  . pantoprazole (PROTONIX) 40 MG tablet    Sig: Take 1 tablet (40 mg total) daily by mouth.    Dispense:  30 tablet    Refill:  6  . traMADol (ULTRAM) 50 MG tablet    Sig: Take 1 tab every 12 hours as needed for pain.    Dispense:  60 tablet    Refill:  1  . nystatin (NYSTATIN) powder     Sig: Apply 2 (two) times daily topically.    Dispense:  45 g    Refill:  0    Follow-up: Return in about 3 months (around 02/22/2017) for follow up on diabetes mellitus.   Arnoldo Morale MD

## 2016-11-23 ENCOUNTER — Encounter: Payer: Self-pay | Admitting: Family Medicine

## 2016-11-23 ENCOUNTER — Encounter: Payer: Self-pay | Admitting: Pharmacist

## 2016-11-23 MED ORDER — BUPROPION HCL ER (XL) 150 MG PO TB24: 450.0000 mg | ORAL_TABLET | Freq: Every day | ORAL | 0 refills | Status: DC

## 2016-11-23 NOTE — Progress Notes (Signed)
Prior authorization completed and approved for tramadol x 180 days. Further use beyond 180 days will require progress notes and justification to continue therapy through [facility] Medicaid. Approval #[id number]

## 2016-12-04 ENCOUNTER — Other Ambulatory Visit: Payer: Self-pay | Admitting: Family Medicine

## 2016-12-04 DIAGNOSIS — M1990 Unspecified osteoarthritis, unspecified site: Secondary | ICD-10-CM

## 2016-12-12 ENCOUNTER — Encounter: Payer: Self-pay | Admitting: Internal Medicine

## 2016-12-12 ENCOUNTER — Ambulatory Visit: Payer: Medicaid Other | Admitting: Internal Medicine

## 2016-12-12 VITALS — BP 118/60 | HR 68 | Ht 66.0 in | Wt 314.5 lb

## 2016-12-12 DIAGNOSIS — E1143 Type 2 diabetes mellitus with diabetic autonomic (poly)neuropathy: Secondary | ICD-10-CM | POA: Diagnosis not present

## 2016-12-12 DIAGNOSIS — K3184 Gastroparesis: Secondary | ICD-10-CM | POA: Diagnosis not present

## 2016-12-12 DIAGNOSIS — K219 Gastro-esophageal reflux disease without esophagitis: Secondary | ICD-10-CM | POA: Diagnosis not present

## 2016-12-12 DIAGNOSIS — K760 Fatty (change of) liver, not elsewhere classified: Secondary | ICD-10-CM

## 2016-12-12 MED ORDER — METOCLOPRAMIDE HCL 5 MG PO TABS: 5.0000 mg | ORAL_TABLET | Freq: Every day | ORAL | 2 refills | Status: DC

## 2016-12-12 NOTE — Progress Notes (Signed)
Subjective:    Patient ID: Tara Keller, female    DOB: 08/11/1965, 51 y.o.   MRN: [id number]  HPI Tara Keller is a 51 year old female with a past medical history of GERD with esophagitis, probable diabetic gastroparesis, fatty liver, history of colon polyps, diabetes with neuropathy, hypertension, hyperlipidemia and sleep apnea who is here for follow-up.  She was last seen in July 2018.  She was having issues with frequent upper abdominal pain, nausea intermittent vomiting and bloating.  She was treated with acid suppression therapy and also metoclopramide.  She had a gastric emptying study in August 2018 which was normal but she states this was performed on metoclopramide.  Metoclopramide therapy she states is been a life changing and improving medicine for her.  She is currently using pantoprazole 40 mg once daily in the morning and metoclopramide 5 mg once daily.  I inquired if she ever use this 3 times or even 4 times a day and she cannot recall doing so.  She states her abdominal pain and nausea, vomiting and bloating is significantly better and even resolved.  She is also had better glucose control and decreased her A1c to just over 8.  She denies complaint today overall and states that she is happy with her current medications.  Bowel movements have been regular and she is not requiring laxatives.  She denies jaundice, lower extremity swelling and bleeding.  She does have a history of H. pylori which was treated.   Review of Systems As per HPI, otherwise negative  Current Medications, Allergies, Past Medical History, Past Surgical History, Family History and Social History were reviewed in [employer] record.     Objective:   Physical Exam BP 118/60   Pulse 68   Ht 5' 6" (1.676 m)   Wt (!) 314 lb 8 oz (142.7 kg)   LMP 01/23/2014 (Approximate) Comment: no cycle since this date  BMI 50.76 kg/m  Constitutional: Well-developed and well-nourished. No  distress. HEENT: Normocephalic and atraumatic. No scleral icterus. Neck: Neck supple. Trachea midline. Cardiovascular: Normal rate, regular rhythm and intact distal pulses. No M/R/G Pulmonary/chest: Effort normal and breath sounds normal. No wheezing, rales or rhonchi. Abdominal: Soft, obese, nontender, nondistended. Bowel sounds active throughout.  Extremities: no clubbing, cyanosis, or edema Neurological: Alert and oriented to person place and time. Skin: Skin is warm and dry. Psychiatric: Normal mood and affect. Behavior is normal.  CBC    Component Value Date/Time   WBC 10.6 (H) 08/04/2016 1233   RBC 5.28 (H) 08/04/2016 1233   HGB 14.6 08/04/2016 1233   HCT 44.5 08/04/2016 1233   PLT 337.0 08/04/2016 1233   MCV 84.4 08/04/2016 1233   MCH 27.7 07/29/2016 2350   MCHC 32.8 08/04/2016 1233   RDW 13.5 08/04/2016 1233   LYMPHSABS 4.7 (H) 08/04/2016 1233   MONOABS 0.8 08/04/2016 1233   EOSABS 0.3 08/04/2016 1233   BASOSABS 0.1 08/04/2016 1233   CMP     Component Value Date/Time   NA 136 08/04/2016 1233   K 4.0 08/04/2016 1233   CL 95 (L) 08/04/2016 1233   CO2 33 (H) 08/04/2016 1233   GLUCOSE 289 (H) 08/04/2016 1233   BUN 6 08/04/2016 1233   CREATININE 0.68 08/04/2016 1233   CREATININE 0.61 01/17/2016 1407   CALCIUM 10.3 08/04/2016 1233   PROT 8.1 08/04/2016 1233   ALBUMIN 3.8 08/04/2016 1233   AST 14 08/04/2016 1233   ALT 21 08/04/2016 1233   ALKPHOS 146 (  H) 08/04/2016 1233   BILITOT 0.5 08/04/2016 1233   GFRNONAA >60 07/29/2016 2350   GFRNONAA >89 01/17/2016 1407   GFRAA >60 07/29/2016 2350   GFRAA >89 01/17/2016 1407   Lab Results  Component Value Date   HGBA1C 8.3 11/22/2016       Assessment & Plan:  51 year old female with a past medical history of GERD with esophagitis, probable diabetic gastroparesis, fatty liver, history of colon polyps, diabetes with neuropathy, hypertension, hyperlipidemia and sleep apnea who is here for follow-up.  1.  Diabetic  gastroparesis --her gastric emptying study was normal but likely falsely negative as she was on metoclopramide therapy.  She has benefited greatly from this therapy.  She wishes to continue metoclopramide and actually she is on a very low and infrequent dose.  We discussed in detail the risks of metoclopramide therapy including neurologic side effects and tardive dyskinesia.  After this discussion she wishes to continue this medicine given its profound benefit to her to this point.  We will reevaluate in subsequent follow-up.  Continue step 3 gastroparesis diet.  Glucose control has been improving which I encouraged her to continue.  2.  GERD with history of esophagitis --well-controlled symptoms on pantoprazole.  Continue pantoprazole 40 mg daily  3.  Fatty liver disease --slight elevation in alk phos with normal liver enzymes.  No evidence for advanced liver disease or cirrhosis.  Her platelets are slightly low.  Consider ultrasound with elastography at follow-up.  Continue risk factor modification including hypertension, hyperlipidemia, diabetes and obesity.  4.  History of colon polyps --surveillance colonoscopy August 2020  82-monthfollow-up, sooner as needed 25 minutes spent with the patient today. Greater than 50% was spent in counseling and coordination of care with the patient

## 2016-12-12 NOTE — Patient Instructions (Signed)
We have sent the following medications to your pharmacy for you to pick up at your convenience: Reglan 5 mg daily  Continue Pantoprazole 40 mg daily  Please follow up with Dr Hilarie Fredrickson in 9 months.  If you are age 53 or older, your body mass index should be between 23-30. Your Body mass index is 50.76 kg/m. If this is out of the aforementioned range listed, please consider follow up with your Primary Care Provider.  If you are age 61 or younger, your body mass index should be between 19-25. Your Body mass index is 50.76 kg/m. If this is out of the aformentioned range listed, please consider follow up with your Primary Care Provider.

## 2016-12-23 ENCOUNTER — Other Ambulatory Visit: Payer: Self-pay | Admitting: Family Medicine

## 2016-12-23 DIAGNOSIS — M1990 Unspecified osteoarthritis, unspecified site: Secondary | ICD-10-CM

## 2016-12-27 DIAGNOSIS — Z6841 Body Mass Index (BMI) 40.0 and over, adult: Secondary | ICD-10-CM | POA: Insufficient documentation

## 2017-01-08 ENCOUNTER — Other Ambulatory Visit: Payer: Self-pay | Admitting: Family Medicine

## 2017-01-08 DIAGNOSIS — M545 Low back pain: Principal | ICD-10-CM

## 2017-01-08 DIAGNOSIS — G8929 Other chronic pain: Secondary | ICD-10-CM

## 2017-01-15 ENCOUNTER — Other Ambulatory Visit: Payer: Self-pay | Admitting: Family Medicine

## 2017-01-15 DIAGNOSIS — E1169 Type 2 diabetes mellitus with other specified complication: Secondary | ICD-10-CM

## 2017-01-15 DIAGNOSIS — E1149 Type 2 diabetes mellitus with other diabetic neurological complication: Secondary | ICD-10-CM

## 2017-01-17 ENCOUNTER — Other Ambulatory Visit: Payer: Self-pay | Admitting: Family Medicine

## 2017-01-17 DIAGNOSIS — M1990 Unspecified osteoarthritis, unspecified site: Secondary | ICD-10-CM

## 2017-01-18 ENCOUNTER — Ambulatory Visit: Payer: Medicaid Other | Attending: Family Medicine | Admitting: Physician Assistant

## 2017-01-18 VITALS — BP 117/75 | HR 78 | Temp 98.1°F | Resp 18 | Ht 66.0 in | Wt 317.0 lb

## 2017-01-18 DIAGNOSIS — F329 Major depressive disorder, single episode, unspecified: Secondary | ICD-10-CM | POA: Diagnosis not present

## 2017-01-18 DIAGNOSIS — F419 Anxiety disorder, unspecified: Secondary | ICD-10-CM | POA: Insufficient documentation

## 2017-01-18 DIAGNOSIS — L853 Xerosis cutis: Secondary | ICD-10-CM

## 2017-01-18 DIAGNOSIS — Z7982 Long term (current) use of aspirin: Secondary | ICD-10-CM | POA: Insufficient documentation

## 2017-01-18 DIAGNOSIS — M1990 Unspecified osteoarthritis, unspecified site: Secondary | ICD-10-CM | POA: Insufficient documentation

## 2017-01-18 DIAGNOSIS — Z794 Long term (current) use of insulin: Secondary | ICD-10-CM | POA: Diagnosis not present

## 2017-01-18 DIAGNOSIS — R52 Pain, unspecified: Secondary | ICD-10-CM | POA: Diagnosis not present

## 2017-01-18 DIAGNOSIS — Z9851 Tubal ligation status: Secondary | ICD-10-CM | POA: Diagnosis not present

## 2017-01-18 DIAGNOSIS — Z79899 Other long term (current) drug therapy: Secondary | ICD-10-CM | POA: Diagnosis not present

## 2017-01-18 DIAGNOSIS — E1165 Type 2 diabetes mellitus with hyperglycemia: Secondary | ICD-10-CM | POA: Diagnosis not present

## 2017-01-18 DIAGNOSIS — I10 Essential (primary) hypertension: Secondary | ICD-10-CM | POA: Insufficient documentation

## 2017-01-18 LAB — GLUCOSE, POCT (MANUAL RESULT ENTRY): POC Glucose: 238 mg/dl — AB (ref 70–99)

## 2017-01-18 MED ORDER — TRAMADOL HCL 50 MG PO TABS: ORAL_TABLET | ORAL | 1 refills | Status: DC

## 2017-01-18 MED ORDER — TRIAMCINOLONE ACETONIDE 0.1 % EX CREA: 1.0000 "application " | TOPICAL_CREAM | Freq: Two times a day (BID) | CUTANEOUS | 1 refills | Status: DC

## 2017-01-18 NOTE — Progress Notes (Signed)
Patient ID: Tara Keller, female   DOB: 05/21/1965, 52 y.o.   MRN: [id number]   Tara Keller, is a 52 y.o. female  CSN:[id number]  MRN:[id number]  DOB - 05/10/1965  Subjective:  Chief Complaint and HPI: Tara Keller is a 52 y.o. female here today for patch of dry skin on her mid upper back.  No f/c.  Itches some.  No new soaps or detergents.    Not checking sugars regularly but says she is compliant with her meds.    Smoking 3 cigs/day.    She is needing a RF of tramadol.  C/o morning aches and pains especially and joint pains with arthritis.    ROS:   Constitutional:  No f/c, No night sweats, No unexplained weight loss. EENT:  No vision changes, No blurry vision, No hearing changes. No mouth, throat, or ear problems.  Respiratory: No cough, No SOB Cardiac: No CP, no palpitations GI:  No abd pain, No N/V/D. GU: No Urinary s/sx Musculoskeletal: general aches and pains-not new Neuro: No headache, no dizziness, no motor weakness.  Skin: + rash Endocrine:  No polydipsia. No polyuria.  Psych: Denies SI/HI  No problems updated.  ALLERGIES: No Known Allergies  PAST MEDICAL HISTORY: Past Medical History:  Diagnosis Date  . Anxiety   . Arthritis   . Asthma   . Depression   . Diabetes mellitus   . Fatty liver   . Fracture closed of upper end of forearm 9 years, Fx left left leg  . Fracture of left lower leg @ 52 years old  . H/O tubal ligation   . Hiatal hernia   . Hypertension   . Obesity   . Positive H. pylori test   . Sleep apnea    uses CPAP  . Tubular adenoma of colon     MEDICATIONS AT HOME: Prior to Admission medications   Medication Sig Start Date End Date Taking? Authorizing Provider  ACCU-CHEK FASTCLIX LANCETS MISC Use as directed 12/22/15  Yes Langeland, Dawn T, MD  albuterol (PROVENTIL HFA;VENTOLIN HFA) 108 (90 Base) MCG/ACT inhaler Inhale 1-2 puffs into the lungs every 6 (six) hours as needed for wheezing or shortness of breath. 03/13/16  Yes Glick, David, MD   aspirin EC 81 MG tablet Take 1 tablet (81 mg total) by mouth daily. 04/25/16  Yes Langeland, Dawn T, MD  Blood Glucose Monitoring Suppl (ACCU-CHEK AVIVA PLUS) w/Device KIT 1 each by Does not apply route 3 (three) times daily. 08/11/16  Yes McClung, Angela M, PA-C  buPROPion (WELLBUTRIN XL) 150 MG 24 hr tablet Take 3 tablets (450 mg total) daily by mouth. 11/23/16  Yes Amao, Enobong, MD  clonazePAM (KLONOPIN) 1 MG tablet Take 1 mg by mouth at bedtime.   Yes [provider]  gabapentin (NEURONTIN) 300 MG capsule Take 1 capsule (300 mg total) at bedtime by mouth. 11/22/16  Yes Amao, Enobong, MD  glucose blood (ACCU-CHEK AVIVA PLUS) test strip TEST three times a day. E11.9 10/24/16  Yes Amao, Enobong, MD  insulin aspart (NOVOLOG) 100 UNIT/ML FlexPen Inject 0-12 Units into the skin 3 (three) times daily with meals. As per sliding scale 09/15/16  Yes Amao, Enobong, MD  Insulin Glargine (LANTUS SOLOSTAR) 100 UNIT/ML Solostar Pen Inject 65 Units daily at 10 pm into the skin. 11/22/16  Yes Amao, Enobong, MD  Insulin Pen Needle 31G X 8 MM MISC Use as directed for 4 times daily insulin administration. 11/07/16  Yes Jegede, Olugbemiga E, MD  Insulin Syringe-Needle U-100 (  BD INSULIN SYRINGE ULTRAFINE) 31G X 15/64" 0.5 ML MISC Use as directed 08/11/15  Yes Jegede, Marlena Clipper, MD  INVOKANA 300 MG TABS tablet Take 1 tablet (300 mg total) daily by mouth. 11/22/16  Yes Amao, Enobong, MD  lisinopril (PRINIVIL,ZESTRIL) 5 MG tablet Take 1 tablet (5 mg total) daily by mouth. 11/22/16  Yes Amao, Charlane Ferretti, MD  metFORMIN (GLUCOPHAGE XR) 500 MG 24 hr tablet Take 2 tablets (1,000 mg total) 2 (two) times daily by mouth. 11/22/16  Yes Amao, Enobong, MD  methocarbamol (ROBAXIN) 500 MG tablet Take 1 tablet (500 mg total) every 8 (eight) hours as needed by mouth for muscle spasms. 11/22/16  Yes Arnoldo Morale, MD  metoCLOPramide (REGLAN) 5 MG tablet Take 1 tablet (5 mg total) by mouth daily. 12/12/16  Yes Pyrtle, Lajuan Lines, MD    naproxen (NAPROSYN) 500 MG tablet TAKE ONE TABLET BY MOUTH TWICE DAILY with meals 01/17/17  Yes Amao, Enobong, MD  NEEDLE, DISP, 24 G (B-D DISP NEEDLE 24GX1") 24G X 1" MISC Use with solostar pen to inject 4 times daily 07/09/14  Yes Jegede, Olugbemiga E, MD  nicotine (NICODERM CQ) 21 mg/24hr patch Place 1 patch (21 mg total) onto the skin daily. 04/25/16  Yes Langeland, Dawn T, MD  nystatin (NYSTATIN) powder Apply 2 (two) times daily topically. 11/22/16  Yes Arnoldo Morale, MD  pantoprazole (PROTONIX) 40 MG tablet Take 1 tablet (40 mg total) daily by mouth. 11/22/16  Yes Amao, Enobong, MD  pravastatin (PRAVACHOL) 20 MG tablet Take 1 tablet (20 mg total) by mouth daily. 11/03/16  Yes Arnoldo Morale, MD  traMADol (ULTRAM) 50 MG tablet Take 1 tab every 12 hours as needed for pain. 01/18/17  Yes Eric Morganti, Dionne Bucy, PA-C  zolpidem (AMBIEN) 5 MG tablet Take 5 mg by mouth at bedtime.   Yes [provider]  triamcinolone cream (KENALOG) 0.1 % Apply 1 application topically 2 (two) times daily. 01/18/17   Argentina Donovan, PA-C     Objective:  EXAM:   Vitals:   01/18/17 1059  BP: 117/75  Pulse: 78  Resp: 18  Temp: 98.1 F (36.7 C)  TempSrc: Oral  SpO2: 99%  Weight: (!) 317 lb (143.8 kg)  Height: 5' 6" (1.676 m)    General appearance : A&OX3. NAD. Non-toxic-appearing, obese HEENT: Atraumatic and Normocephalic.  PERRLA. EOM intact.   Neck: supple, no JVD. No cervical lymphadenopathy. No thyromegaly Chest/Lungs:  Breathing-non-labored, Good air entry bilaterally, breath sounds normal without rales, rhonchi, or wheezing  CVS: S1 S2 regular, no murmurs, gallops, rubs  Abdomen: Bowel sounds present, Non tender and not distended with no gaurding, rigidity or rebound. Extremities: Bilateral Lower Ext shows no edema, both legs are warm to touch with = pulse throughout Neurology:  CN II-XII grossly intact, Non focal.   Psych:  TP linear. J/I WNL. Normal speech. Appropriate eye contact and affect.   Skin:  Dry area of skin along mid-upper back centrally.  No discrete lesions.  Area of dryness measures about 4X6cm.  No erythema or sign of infection.    Data Review Lab Results  Component Value Date   HGBA1C 8.3 11/22/2016   HGBA1C 13.9 (H) 08/04/2016   HGBA1C 10.3 04/25/2016     Assessment & Plan   1. Dry skin - TSH - triamcinolone cream (KENALOG) 0.1 %; Apply 1 application topically 2 (two) times daily.  Dispense: 45 g; Refill: 1  2. Type 2 diabetes mellitus with hyperglycemia, without long-term current use of insulin (HCC)  Uncontrolled.  Counseled at length on diet/exercise and better blood sugar control.  Compliance with meds imperative.   - Glucose (CBG) - Comprehensive metabolic panel  3. Arthritis - traMADol (ULTRAM) 50 MG tablet; Take 1 tab every 12 hours as needed for pain.  Dispense: 60 tablet; Refill: 1  4. Body aches - TSH - Vitamin D, 25-hydroxy  Patient have been counseled extensively about nutrition and exercise  Return in about 5 weeks (around 02/22/2017) for Dr Newlin; DM and general issues.  The patient was given clear instructions to go to ER or return to medical center if symptoms don't improve, worsen or new problems develop. The patient verbalized understanding. The patient was told to call to get lab results if they haven't heard anything in the next week.     Angela McClung, PA-C [facility] [place], [place] [phone]   01/18/2017, 11:17 AM 

## 2017-01-19 LAB — COMPREHENSIVE METABOLIC PANEL
ALT: 18 IU/L (ref 0–32)
AST: 11 IU/L (ref 0–40)
Albumin/Globulin Ratio: 1.1 — ABNORMAL LOW (ref 1.2–2.2)
Albumin: 3.8 g/dL (ref 3.5–5.5)
Alkaline Phosphatase: 141 IU/L — ABNORMAL HIGH (ref 39–117)
BUN/Creatinine Ratio: 6 — ABNORMAL LOW (ref 9–23)
BUN: 4 mg/dL — AB (ref 6–24)
CALCIUM: 9.3 mg/dL (ref 8.7–10.2)
CHLORIDE: 98 mmol/L (ref 96–106)
CO2: 24 mmol/L (ref 20–29)
CREATININE: 0.63 mg/dL (ref 0.57–1.00)
GFR, EST AFRICAN AMERICAN: 118 mL/min/{1.73_m2} (ref >59)
GFR, EST NON AFRICAN AMERICAN: 103 mL/min/{1.73_m2} (ref >59)
GLUCOSE: 229 mg/dL — AB (ref 65–99)
Globulin, Total: 3.5 g/dL (ref 1.5–4.5)
Potassium: 3.6 mmol/L (ref 3.5–5.2)
Sodium: 140 mmol/L (ref 134–144)
TOTAL PROTEIN: 7.3 g/dL (ref 6.0–8.5)

## 2017-01-19 LAB — TSH: TSH: 0.971 u[IU]/mL (ref 0.450–4.500)

## 2017-01-19 LAB — VITAMIN D 25 HYDROXY (VIT D DEFICIENCY, FRACTURES): Vit D, 25-Hydroxy: 8.6 ng/mL — ABNORMAL LOW (ref 30.0–100.0)

## 2017-01-23 ENCOUNTER — Other Ambulatory Visit: Payer: Self-pay | Admitting: Physician Assistant

## 2017-01-23 MED ORDER — VITAMIN D (ERGOCALCIFEROL) 1.25 MG (50000 UNIT) PO CAPS: 50000.0000 [IU] | ORAL_CAPSULE | ORAL | 0 refills | Status: DC

## 2017-01-25 ENCOUNTER — Telehealth: Payer: Self-pay | Admitting: *Deleted

## 2017-01-25 NOTE — Telephone Encounter (Signed)
Patient verified DOB Patient is aware of vitamin d level being low and weekly supplement being prescribed to [facility]. Patient is also aware of thyroid being normal and all other labs being fair. Patient is aware of a recheck being completed in the next 3-4 months. No further questions at this time.

## 2017-01-25 NOTE — Telephone Encounter (Signed)
-----   Message from Argentina Donovan, [place] sent at 01/23/2017  2:18 PM EST ----- Please call patient and let them that their vitamin D is very low.  This can contribute to muscle aches, anxiety, fatigue, and depression.  I have sent a prescription to the pharmacy for them to take once a week.  We will recheck this level in 3-4 months.  Thyroid level is normal.  Other labs are fairly stable.  Take all meds as directed to preserve kidney function.  Her alkaline phosphatase is a little elevated.  We will follow this.  Follow-up as planned. Thanks, [name], PA-C

## 2017-01-26 ENCOUNTER — Ambulatory Visit: Payer: Medicaid Other | Admitting: Family Medicine

## 2017-02-05 ENCOUNTER — Ambulatory Visit: Payer: Self-pay | Admitting: Podiatry

## 2017-02-05 ENCOUNTER — Other Ambulatory Visit: Payer: Self-pay | Admitting: Family Medicine

## 2017-02-05 DIAGNOSIS — E1169 Type 2 diabetes mellitus with other specified complication: Secondary | ICD-10-CM

## 2017-02-08 ENCOUNTER — Other Ambulatory Visit: Payer: Self-pay | Admitting: Family Medicine

## 2017-02-08 DIAGNOSIS — E1149 Type 2 diabetes mellitus with other diabetic neurological complication: Secondary | ICD-10-CM

## 2017-02-08 DIAGNOSIS — E1169 Type 2 diabetes mellitus with other specified complication: Secondary | ICD-10-CM

## 2017-02-09 ENCOUNTER — Other Ambulatory Visit: Payer: Self-pay | Admitting: Family Medicine

## 2017-02-09 DIAGNOSIS — E1169 Type 2 diabetes mellitus with other specified complication: Secondary | ICD-10-CM

## 2017-02-09 DIAGNOSIS — E1149 Type 2 diabetes mellitus with other diabetic neurological complication: Secondary | ICD-10-CM

## 2017-02-13 ENCOUNTER — Other Ambulatory Visit: Payer: Self-pay | Admitting: Family Medicine

## 2017-02-13 DIAGNOSIS — M1990 Unspecified osteoarthritis, unspecified site: Secondary | ICD-10-CM

## 2017-02-15 ENCOUNTER — Other Ambulatory Visit: Payer: Self-pay | Admitting: Family Medicine

## 2017-02-15 DIAGNOSIS — M1990 Unspecified osteoarthritis, unspecified site: Secondary | ICD-10-CM

## 2017-02-16 ENCOUNTER — Other Ambulatory Visit: Payer: Self-pay | Admitting: Family Medicine

## 2017-02-16 ENCOUNTER — Ambulatory Visit: Payer: Medicaid Other | Attending: Family Medicine | Admitting: Family Medicine

## 2017-02-16 ENCOUNTER — Encounter: Payer: Self-pay | Admitting: Family Medicine

## 2017-02-16 VITALS — BP 130/69 | HR 93 | Temp 98.0°F | Ht 66.0 in | Wt 332.4 lb

## 2017-02-16 DIAGNOSIS — Z9049 Acquired absence of other specified parts of digestive tract: Secondary | ICD-10-CM | POA: Insufficient documentation

## 2017-02-16 DIAGNOSIS — F329 Major depressive disorder, single episode, unspecified: Secondary | ICD-10-CM | POA: Diagnosis not present

## 2017-02-16 DIAGNOSIS — J018 Other acute sinusitis: Secondary | ICD-10-CM

## 2017-02-16 DIAGNOSIS — F419 Anxiety disorder, unspecified: Secondary | ICD-10-CM | POA: Diagnosis not present

## 2017-02-16 DIAGNOSIS — J45909 Unspecified asthma, uncomplicated: Secondary | ICD-10-CM | POA: Diagnosis not present

## 2017-02-16 DIAGNOSIS — Z6841 Body Mass Index (BMI) 40.0 and over, adult: Secondary | ICD-10-CM | POA: Diagnosis not present

## 2017-02-16 DIAGNOSIS — E669 Obesity, unspecified: Secondary | ICD-10-CM | POA: Insufficient documentation

## 2017-02-16 DIAGNOSIS — E1169 Type 2 diabetes mellitus with other specified complication: Secondary | ICD-10-CM

## 2017-02-16 DIAGNOSIS — E1149 Type 2 diabetes mellitus with other diabetic neurological complication: Secondary | ICD-10-CM

## 2017-02-16 DIAGNOSIS — E1165 Type 2 diabetes mellitus with hyperglycemia: Secondary | ICD-10-CM | POA: Diagnosis not present

## 2017-02-16 DIAGNOSIS — K3184 Gastroparesis: Secondary | ICD-10-CM | POA: Insufficient documentation

## 2017-02-16 DIAGNOSIS — M1711 Unilateral primary osteoarthritis, right knee: Secondary | ICD-10-CM

## 2017-02-16 DIAGNOSIS — Z794 Long term (current) use of insulin: Secondary | ICD-10-CM | POA: Diagnosis not present

## 2017-02-16 DIAGNOSIS — H9201 Otalgia, right ear: Secondary | ICD-10-CM | POA: Insufficient documentation

## 2017-02-16 DIAGNOSIS — J329 Chronic sinusitis, unspecified: Secondary | ICD-10-CM | POA: Insufficient documentation

## 2017-02-16 DIAGNOSIS — G4730 Sleep apnea, unspecified: Secondary | ICD-10-CM | POA: Diagnosis not present

## 2017-02-16 DIAGNOSIS — Z7982 Long term (current) use of aspirin: Secondary | ICD-10-CM | POA: Diagnosis not present

## 2017-02-16 DIAGNOSIS — E1143 Type 2 diabetes mellitus with diabetic autonomic (poly)neuropathy: Secondary | ICD-10-CM | POA: Diagnosis not present

## 2017-02-16 DIAGNOSIS — K219 Gastro-esophageal reflux disease without esophagitis: Secondary | ICD-10-CM | POA: Insufficient documentation

## 2017-02-16 DIAGNOSIS — M1990 Unspecified osteoarthritis, unspecified site: Secondary | ICD-10-CM

## 2017-02-16 DIAGNOSIS — I10 Essential (primary) hypertension: Secondary | ICD-10-CM | POA: Insufficient documentation

## 2017-02-16 DIAGNOSIS — Z79899 Other long term (current) drug therapy: Secondary | ICD-10-CM | POA: Insufficient documentation

## 2017-02-16 LAB — POCT GLYCOSYLATED HEMOGLOBIN (HGB A1C): Hemoglobin A1C: 8.7

## 2017-02-16 LAB — GLUCOSE, POCT (MANUAL RESULT ENTRY): POC Glucose: 204 mg/dl — AB (ref 70–99)

## 2017-02-16 MED ORDER — CETIRIZINE HCL 10 MG PO TABS: 10.0000 mg | ORAL_TABLET | Freq: Every day | ORAL | 1 refills | Status: DC

## 2017-02-16 MED ORDER — INSULIN GLARGINE 100 UNIT/ML SOLOSTAR PEN: 70.0000 [IU] | PEN_INJECTOR | Freq: Every day | SUBCUTANEOUS | 3 refills | Status: DC

## 2017-02-16 MED ORDER — AMOXICILLIN 500 MG PO CAPS: 500.0000 mg | ORAL_CAPSULE | Freq: Three times a day (TID) | ORAL | 0 refills | Status: DC

## 2017-02-16 MED ORDER — BENZONATATE 100 MG PO CAPS: 100.0000 mg | ORAL_CAPSULE | Freq: Two times a day (BID) | ORAL | 0 refills | Status: DC | PRN

## 2017-02-16 NOTE — Patient Instructions (Signed)

## 2017-02-16 NOTE — Progress Notes (Signed)
Subjective:  Patient ID: Tara Keller, female    DOB: 1965/05/06  Age: 52 y.o. MRN: [id number]  CC: Sinusitis and Diabetes   HPI Tara Keller is a 52 year old female with a history of uncontrolled type 2 diabetes mellitus (A1c 8.7 ), diabetic neuropathy, GERD, gastroparesis, depression and anxiety who presents today for a follow-up visit.  She complains of a 10-day history of cough productive of whitish sputum, right ear tinnitus and pain, right facial pain which has not responded to the use of Zyrtec.  She has had postnasal drip but denies the presence of fever or headaches.  She also complains of right knee pain for which she takes Tramadol and naproxen.  She was referred to orthopedics but states she missed her appointment due to a death in the family.  Pain is worse with going up and down the stairs and impairs her ability to walk.  With regards to her diabetes mellitus her A1c has trended up from 8.3-8.7 and she endorses compliance with her Lantus.  Denies hypoglycemia, visual concerns.  Her diabetic neuropathy is controlled on gabapentin.  Past Medical History:  Diagnosis Date  . Anxiety   . Arthritis   . Asthma   . Depression   . Diabetes mellitus   . Fatty liver   . Fracture closed of upper end of forearm 9 years, Fx left left leg  . Fracture of left lower leg @ 52 years old  . H/O tubal ligation   . Hiatal hernia   . Hypertension   . Obesity   . Positive H. pylori test   . Sleep apnea    uses CPAP  . Tubular adenoma of colon     Past Surgical History:  Procedure Laterality Date  . CHOLECYSTECTOMY    . COLONOSCOPY WITH PROPOFOL N/A 08/15/2013   Procedure: COLONOSCOPY WITH PROPOFOL;  Surgeon: Jerene Bears, MD;  Location: [facility]COPY;  Service: Gastroenterology;  Laterality: N/A;  . ESOPHAGOGASTRODUODENOSCOPY N/A 05/12/2014   Procedure: ESOPHAGOGASTRODUODENOSCOPY (EGD);  Surgeon: Jerene Bears, MD;  Location: [facility];  Service: Gastroenterology;  Laterality: N/A;    . ESOPHAGOGASTRODUODENOSCOPY (EGD) WITH PROPOFOL N/A 08/15/2013   Procedure: ESOPHAGOGASTRODUODENOSCOPY (EGD) WITH PROPOFOL;  Surgeon: Jerene Bears, MD;  Location: [facility];  Service: Gastroenterology;  Laterality: N/A;  . TUBAL LIGATION Bilateral     No Known Allergies   Outpatient Medications Prior to Visit  Medication Sig Dispense Refill  . ACCU-CHEK FASTCLIX LANCETS MISC Use as directed 100 each 12  . albuterol (PROVENTIL HFA;VENTOLIN HFA) 108 (90 Base) MCG/ACT inhaler Inhale 1-2 puffs into the lungs every 6 (six) hours as needed for wheezing or shortness of breath. 1 Inhaler 0  . aspirin EC 81 MG tablet Take 1 tablet (81 mg total) by mouth daily. 90 tablet 3  . Blood Glucose Monitoring Suppl (ACCU-CHEK AVIVA PLUS) w/Device KIT 1 each by Does not apply route 3 (three) times daily. 1 kit 0  . buPROPion (WELLBUTRIN XL) 150 MG 24 hr tablet Take 3 tablets (450 mg total) daily by mouth. 30 tablet 0  . clonazePAM (KLONOPIN) 1 MG tablet Take 1 mg by mouth at bedtime.    . gabapentin (NEURONTIN) 300 MG capsule TAKE ONE CAPSULE BY MOUTH AT BEDTIME 30 capsule 1  . glucose blood (ACCU-CHEK AVIVA PLUS) test strip TEST three times a day. E11.9 100 each 12  . insulin aspart (NOVOLOG) 100 UNIT/ML FlexPen Inject 0-12 Units into the skin 3 (three) times daily with meals. As  per sliding scale 15 mL 6  . Insulin Pen Needle 31G X 8 MM MISC Use as directed for 4 times daily insulin administration. 100 each 12  . Insulin Syringe-Needle U-100 (BD INSULIN SYRINGE ULTRAFINE) 31G X 15/64" 0.5 ML MISC Use as directed 100 each 5  . INVOKANA 300 MG TABS tablet Take 1 tablet (300 mg total) daily by mouth. 90 tablet 1  . lisinopril (PRINIVIL,ZESTRIL) 5 MG tablet Take 1 tablet (5 mg total) daily by mouth. 90 tablet 1  . metFORMIN (GLUCOPHAGE-XR) 500 MG 24 hr tablet TAKE TWO TABLETS BY MOUTH TWICE DAILY 120 tablet 2  . methocarbamol (ROBAXIN) 500 MG tablet Take 1 tablet (500 mg total) every 8 (eight) hours as needed  by mouth for muscle spasms. 90 tablet 2  . metoCLOPramide (REGLAN) 5 MG tablet Take 1 tablet (5 mg total) by mouth daily. 30 tablet 2  . naproxen (NAPROSYN) 500 MG tablet TAKE ONE TABLET BY MOUTH TWICE DAILY with meals 60 tablet 0  . NEEDLE, DISP, 24 G (B-D DISP NEEDLE 24GX1") 24G X 1" MISC Use with solostar pen to inject 4 times daily 200 each 1  . nystatin (NYSTATIN) powder Apply 2 (two) times daily topically. 45 g 0  . pantoprazole (PROTONIX) 40 MG tablet Take 1 tablet (40 mg total) daily by mouth. 30 tablet 6  . pravastatin (PRAVACHOL) 20 MG tablet Take 1 tablet (20 mg total) by mouth daily. 90 tablet 1  . traMADol (ULTRAM) 50 MG tablet Take 1 tab every 12 hours as needed for pain. 60 tablet 1  . triamcinolone cream (KENALOG) 0.1 % Apply 1 application topically 2 (two) times daily. 45 g 1  . zolpidem (AMBIEN) 5 MG tablet Take 5 mg by mouth at bedtime.    . Insulin Glargine (LANTUS SOLOSTAR) 100 UNIT/ML Solostar Pen Inject 65 Units daily at 10 pm into the skin. 5 pen 3  . nicotine (NICODERM CQ) 21 mg/24hr patch Place 1 patch (21 mg total) onto the skin daily. (Patient not taking: Reported on 02/16/2017) 28 patch 0  . Vitamin D, Ergocalciferol, (DRISDOL) 50000 units CAPS capsule Take 1 capsule (50,000 Units total) by mouth every 7 (seven) days. (Patient not taking: Reported on 02/16/2017) 16 capsule 0   No facility-administered medications prior to visit.     ROS Review of Systems  Constitutional: Negative for activity change, appetite change and fatigue.  HENT: Positive for congestion, ear pain, postnasal drip and sinus pain. Negative for sinus pressure and sore throat.   Eyes: Negative for visual disturbance.  Respiratory: Positive for cough. Negative for chest tightness, shortness of breath and wheezing.   Cardiovascular: Negative for chest pain and palpitations.  Gastrointestinal: Negative for abdominal distention, abdominal pain and constipation.  Endocrine: Negative for polydipsia.    Genitourinary: Negative for dysuria and frequency.  Musculoskeletal: Positive for arthralgias.  Skin: Negative for rash.  Neurological: Negative for tremors, light-headedness and numbness.  Hematological: Does not bruise/bleed easily.  Psychiatric/Behavioral: Negative for agitation and behavioral problems.    Objective:  BP 130/69   Pulse 93   Temp 98 F (36.7 C) (Oral)   Ht 5' 6" (1.676 m)   Wt (!) 332 lb 6.4 oz (150.8 kg)   LMP 01/23/2014 (Approximate) Comment: no cycle since this date  SpO2 98%   BMI 53.65 kg/m   BP/Weight 02/16/2017 01/18/2017 91/06/9448  Systolic BP 388 828 003  Diastolic BP 69 75 60  Wt. (Lbs) 332.4 317 314.5  BMI 53.65 51.17 50.76  Physical Exam  Constitutional: She is oriented to person, place, and time. She appears well-developed and well-nourished.  HENT:  Left Ear: External ear normal.  Right ear with some purulent drainage Erythematous oropharynx Right maxillary sinus tenderness  Cardiovascular: Normal rate, normal heart sounds and intact distal pulses.  No murmur heard. Pulmonary/Chest: Effort normal and breath sounds normal. She has no wheezes. She has no rales. She exhibits no tenderness.  Abdominal: Soft. Bowel sounds are normal. She exhibits no distension and no mass. There is no tenderness.  Musculoskeletal:  Tenderness and crepitus on range of motion of right knee  Neurological: She is alert and oriented to person, place, and time.  Skin: Skin is warm and dry.  Psychiatric: She has a normal mood and affect.     CMP Latest Ref Rng & Units 01/18/2017 08/04/2016 07/29/2016  Glucose 65 - 99 mg/dL 229(H) 289(H) 378(H)  BUN 6 - 24 mg/dL 4(L) 6 6  Creatinine 0.57 - 1.00 mg/dL 0.63 0.68 0.84  Sodium 134 - 144 mmol/L 140 136 132(L)  Potassium 3.5 - 5.2 mmol/L 3.6 4.0 3.5  Chloride 96 - 106 mmol/L 98 95(L) 96(L)  CO2 20 - 29 mmol/L 24 33(H) 28  Calcium 8.7 - 10.2 mg/dL 9.3 10.3 9.4  Total Protein 6.0 - 8.5 g/dL 7.3 8.1 7.7  Total  Bilirubin 0.0 - 1.2 mg/dL <0.2 0.5 0.5  Alkaline Phos 39 - 117 IU/L 141(H) 146(H) 143(H)  AST 0 - 40 IU/L _0 ALT 0 - 32 IU/L _1 Lipid Panel     Component Value Date/Time   CHOL 167 04/25/2016 1130   TRIG 246 (H) 04/25/2016 1130   HDL 39 (L) 04/25/2016 1130   CHOLHDL 4.3 04/25/2016 1130   CHOLHDL 3.7 08/15/2012 1058   VLDL 21 08/15/2012 1058   LDLCALC 79 04/25/2016 1130    Lab Results  Component Value Date   HGBA1C 8.7 02/16/2017    Assessment & Plan:   1. Type 2 diabetes mellitus with other specified complication, with long-term current use of insulin (HCC) Uncontrolled with A1c of 8.7 Increase Lantus to 70 units at bedtime Counseled on Diabetic diet, my plate method, 716 minutes of moderate intensity exercise/week Keep blood sugar logs with fasting goals of 80-120 mg/dl, random of less than 180 and in the event of sugars less than 60 mg/dl or greater than 400 mg/dl please notify the clinic ASAP. It is recommended that you undergo annual eye exams and annual foot exams. Pneumonia vaccine is recommended. - POCT glucose (manual entry) - POCT glycosylated hemoglobin (Hb A1C) - Insulin Glargine (LANTUS SOLOSTAR) 100 UNIT/ML Solostar Pen; Inject 70 Units into the skin daily at 10 pm.  Dispense: 5 pen; Refill: 3  2. Other diabetic neurological complication associated with type 2 diabetes mellitus (HCC) Continue gabapentin  3. Primary osteoarthritis of right knee Uncontrolled Currently on naproxen and tramadol Previously referred to orthopedic but she missed appointment-we will refer again - Ambulatory referral to Orthopedic Surgery  4. Acute non-recurrent sinusitis of other sinus Increase oral hydration, rest - amoxicillin (AMOXIL) 500 MG capsule; Take 1 capsule (500 mg total) by mouth 3 (three) times daily.  Dispense: 30 capsule; Refill: 0 - benzonatate (TESSALON) 100 MG capsule; Take 1 capsule (100 mg total) by mouth 2 (two) times daily as needed for cough.   Dispense: 20 capsule; Refill: 0 - cetirizine (ZYRTEC) 10 MG tablet; Take 1 tablet (10 mg total) by mouth daily.  Dispense: 30  tablet; Refill: 1   Meds ordered this encounter  Medications  . Insulin Glargine (LANTUS SOLOSTAR) 100 UNIT/ML Solostar Pen    Sig: Inject 70 Units into the skin daily at 10 pm.    Dispense:  5 pen    Refill:  3    Discontinue previous dose  . amoxicillin (AMOXIL) 500 MG capsule    Sig: Take 1 capsule (500 mg total) by mouth 3 (three) times daily.    Dispense:  30 capsule    Refill:  0  . benzonatate (TESSALON) 100 MG capsule    Sig: Take 1 capsule (100 mg total) by mouth 2 (two) times daily as needed for cough.    Dispense:  20 capsule    Refill:  0  . cetirizine (ZYRTEC) 10 MG tablet    Sig: Take 1 tablet (10 mg total) by mouth daily.    Dispense:  30 tablet    Refill:  1    Follow-up: Return in about 3 months (around 05/16/2017) for Follow-up of chronic medical conditions.   Charlott Rakes MD

## 2017-02-19 ENCOUNTER — Encounter: Payer: Self-pay | Admitting: Podiatry

## 2017-02-19 ENCOUNTER — Ambulatory Visit: Payer: Medicaid Other | Admitting: Podiatry

## 2017-02-19 DIAGNOSIS — E0843 Diabetes mellitus due to underlying condition with diabetic autonomic (poly)neuropathy: Secondary | ICD-10-CM

## 2017-02-19 DIAGNOSIS — B351 Tinea unguium: Secondary | ICD-10-CM

## 2017-02-19 DIAGNOSIS — M79609 Pain in unspecified limb: Principal | ICD-10-CM

## 2017-02-19 DIAGNOSIS — M79676 Pain in unspecified toe(s): Secondary | ICD-10-CM

## 2017-02-20 NOTE — Progress Notes (Signed)
   SUBJECTIVE Patient with a history of diabetes mellitus presents to office today complaining of elongated, thickened nails. Pain while ambulating in shoes. Patient is unable to trim their own nails.   Past Medical History:  Diagnosis Date  . Anxiety   . Arthritis   . Asthma   . Depression   . Diabetes mellitus   . Fatty liver   . Fracture closed of upper end of forearm 9 years, Fx left left leg  . Fracture of left lower leg @ 52 years old  . H/O tubal ligation   . Hiatal hernia   . Hypertension   . Obesity   . Positive H. pylori test   . Sleep apnea    uses CPAP  . Tubular adenoma of colon     OBJECTIVE General Patient is awake, alert, and oriented x 3 and in no acute distress. Derm Skin is dry and supple bilateral. Negative open lesions or macerations. Remaining integument unremarkable. Nails are tender, long, thickened and dystrophic with subungual debris, consistent with onychomycosis, 1-5 bilateral. No signs of infection noted. Vasc  DP and PT pedal pulses palpable bilaterally. Temperature gradient within normal limits.  Neuro Epicritic and protective threshold sensation diminished bilaterally.  Musculoskeletal Exam No symptomatic pedal deformities noted bilateral. Muscular strength within normal limits.  ASSESSMENT 1. Diabetes Mellitus w/ peripheral neuropathy 2. Onychomycosis of nail due to dermatophyte bilateral 3. Pain in foot bilateral  PLAN OF CARE 1. Patient evaluated today. 2. Instructed to maintain good pedal hygiene and foot care. Stressed importance of controlling blood sugar.  3. Mechanical debridement of nails 1-5 bilaterally performed using a nail nipper. Filed with dremel without incident.  4. Return to clinic in 3 mos.   Getting engaged next month.   Edrick Kins, DPM [facility]  Dr. Edrick Kins, [address]                                        [place], [place] [address]                Office [phone]  Fax  [phone]

## 2017-02-21 ENCOUNTER — Other Ambulatory Visit: Payer: Self-pay | Admitting: Internal Medicine

## 2017-02-21 ENCOUNTER — Other Ambulatory Visit: Payer: Self-pay | Admitting: Family Medicine

## 2017-02-21 DIAGNOSIS — M545 Low back pain: Principal | ICD-10-CM

## 2017-02-21 DIAGNOSIS — K3184 Gastroparesis: Principal | ICD-10-CM

## 2017-02-21 DIAGNOSIS — E1143 Type 2 diabetes mellitus with diabetic autonomic (poly)neuropathy: Secondary | ICD-10-CM

## 2017-02-21 DIAGNOSIS — G8929 Other chronic pain: Secondary | ICD-10-CM

## 2017-02-28 ENCOUNTER — Ambulatory Visit: Payer: Medicaid Other | Admitting: Family Medicine

## 2017-03-07 ENCOUNTER — Ambulatory Visit (INDEPENDENT_AMBULATORY_CARE_PROVIDER_SITE_OTHER): Payer: Medicaid Other | Admitting: Orthopedic Surgery

## 2017-03-08 ENCOUNTER — Other Ambulatory Visit: Payer: Self-pay | Admitting: Family Medicine

## 2017-03-08 DIAGNOSIS — E1169 Type 2 diabetes mellitus with other specified complication: Secondary | ICD-10-CM

## 2017-03-08 MED ORDER — INVOKANA 300 MG PO TABS: 300.0000 mg | ORAL_TABLET | Freq: Every day | ORAL | 1 refills | Status: DC

## 2017-03-09 ENCOUNTER — Other Ambulatory Visit: Payer: Self-pay | Admitting: Family Medicine

## 2017-03-09 DIAGNOSIS — E1149 Type 2 diabetes mellitus with other diabetic neurological complication: Secondary | ICD-10-CM

## 2017-03-12 ENCOUNTER — Ambulatory Visit: Payer: Medicaid Other | Attending: Family Medicine | Admitting: Family Medicine

## 2017-03-12 ENCOUNTER — Other Ambulatory Visit: Payer: Self-pay | Admitting: Family Medicine

## 2017-03-12 ENCOUNTER — Encounter: Payer: Self-pay | Admitting: Family Medicine

## 2017-03-12 VITALS — BP 137/83 | HR 85 | Temp 98.2°F | Ht 66.0 in | Wt 327.8 lb

## 2017-03-12 DIAGNOSIS — J45909 Unspecified asthma, uncomplicated: Secondary | ICD-10-CM | POA: Diagnosis not present

## 2017-03-12 DIAGNOSIS — B3789 Other sites of candidiasis: Secondary | ICD-10-CM

## 2017-03-12 DIAGNOSIS — E1149 Type 2 diabetes mellitus with other diabetic neurological complication: Secondary | ICD-10-CM

## 2017-03-12 DIAGNOSIS — E669 Obesity, unspecified: Secondary | ICD-10-CM | POA: Insufficient documentation

## 2017-03-12 DIAGNOSIS — I10 Essential (primary) hypertension: Secondary | ICD-10-CM | POA: Insufficient documentation

## 2017-03-12 DIAGNOSIS — G4730 Sleep apnea, unspecified: Secondary | ICD-10-CM | POA: Diagnosis not present

## 2017-03-12 DIAGNOSIS — E1169 Type 2 diabetes mellitus with other specified complication: Secondary | ICD-10-CM | POA: Diagnosis not present

## 2017-03-12 DIAGNOSIS — Z7982 Long term (current) use of aspirin: Secondary | ICD-10-CM | POA: Diagnosis not present

## 2017-03-12 DIAGNOSIS — E1143 Type 2 diabetes mellitus with diabetic autonomic (poly)neuropathy: Secondary | ICD-10-CM | POA: Insufficient documentation

## 2017-03-12 DIAGNOSIS — D126 Benign neoplasm of colon, unspecified: Secondary | ICD-10-CM | POA: Diagnosis not present

## 2017-03-12 DIAGNOSIS — Z79899 Other long term (current) drug therapy: Secondary | ICD-10-CM | POA: Diagnosis not present

## 2017-03-12 DIAGNOSIS — F329 Major depressive disorder, single episode, unspecified: Secondary | ICD-10-CM | POA: Diagnosis not present

## 2017-03-12 DIAGNOSIS — K3184 Gastroparesis: Secondary | ICD-10-CM | POA: Insufficient documentation

## 2017-03-12 DIAGNOSIS — Z9851 Tubal ligation status: Secondary | ICD-10-CM | POA: Insufficient documentation

## 2017-03-12 DIAGNOSIS — F419 Anxiety disorder, unspecified: Secondary | ICD-10-CM | POA: Diagnosis not present

## 2017-03-12 DIAGNOSIS — J018 Other acute sinusitis: Secondary | ICD-10-CM

## 2017-03-12 DIAGNOSIS — G8929 Other chronic pain: Secondary | ICD-10-CM

## 2017-03-12 DIAGNOSIS — K449 Diaphragmatic hernia without obstruction or gangrene: Secondary | ICD-10-CM | POA: Insufficient documentation

## 2017-03-12 DIAGNOSIS — M5441 Lumbago with sciatica, right side: Secondary | ICD-10-CM | POA: Diagnosis not present

## 2017-03-12 DIAGNOSIS — K219 Gastro-esophageal reflux disease without esophagitis: Secondary | ICD-10-CM | POA: Insufficient documentation

## 2017-03-12 DIAGNOSIS — Z794 Long term (current) use of insulin: Secondary | ICD-10-CM | POA: Diagnosis not present

## 2017-03-12 LAB — GLUCOSE, POCT (MANUAL RESULT ENTRY): POC GLUCOSE: 349 mg/dL — AB (ref 70–99)

## 2017-03-12 MED ORDER — GABAPENTIN 300 MG PO CAPS: 300.0000 mg | ORAL_CAPSULE | Freq: Two times a day (BID) | ORAL | 2 refills | Status: DC

## 2017-03-12 MED ORDER — NYSTATIN 100000 UNIT/GM EX POWD: Freq: Two times a day (BID) | CUTANEOUS | 1 refills | Status: DC

## 2017-03-12 MED ORDER — METHOCARBAMOL 500 MG PO TABS: 1000.0000 mg | ORAL_TABLET | Freq: Two times a day (BID) | ORAL | 3 refills | Status: DC | PRN

## 2017-03-12 NOTE — Progress Notes (Signed)
Subjective:  Patient ID: Tara Keller, female    DOB: [birth year]  Age: 52 y.o. MRN: [id number]  CC: Diabetes   HPI Tara Keller is a 52 year old female with a history of uncontrolled type 2 diabetes mellitus (A1c 8.7 ), diabetic neuropathy, GERD, gastroparesis, depression and anxiety who presents today for an acute visit complaining of a one week history of low back pain which radiates down her right lower extremity of sudden onset onset and no preceding history of trauma. It causes her legs to almost give out from under her and she almost took a fall last week but was caught by an on looker. Pain increases with prolonged sitting and is associated with tingling. She denies loss of sphincteric function.  Past Medical History:  Diagnosis Date  . Anxiety   . Arthritis   . Asthma   . Depression   . Diabetes mellitus   . Fatty liver   . Fracture closed of upper end of forearm 9 years, Fx left left leg  . Fracture of left lower leg @ 52 years old  . H/O tubal ligation   . Hiatal hernia   . Hypertension   . Obesity   . Positive H. pylori test   . Sleep apnea    uses CPAP  . Tubular adenoma of colon     Past Surgical History:  Procedure Laterality Date  . CHOLECYSTECTOMY    . COLONOSCOPY WITH PROPOFOL N/A 08/15/2013   Procedure: COLONOSCOPY WITH PROPOFOL;  Surgeon: Jerene Bears, MD;  Location: [facility];  Service: Gastroenterology;  Laterality: N/A;  . ESOPHAGOGASTRODUODENOSCOPY N/A 05/12/2014   Procedure: ESOPHAGOGASTRODUODENOSCOPY (EGD);  Surgeon: Jerene Bears, MD;  Location: [facility];  Service: Gastroenterology;  Laterality: N/A;  . ESOPHAGOGASTRODUODENOSCOPY (EGD) WITH PROPOFOL N/A 08/15/2013   Procedure: ESOPHAGOGASTRODUODENOSCOPY (EGD) WITH PROPOFOL;  Surgeon: Jerene Bears, MD;  Location: [facility];  Service: Gastroenterology;  Laterality: N/A;  . TUBAL LIGATION Bilateral     No Known Allergies   Outpatient Medications Prior to Visit  Medication Sig Dispense  Refill  . ACCU-CHEK FASTCLIX LANCETS MISC Use as directed 100 each 12  . albuterol (PROVENTIL HFA;VENTOLIN HFA) 108 (90 Base) MCG/ACT inhaler Inhale 1-2 puffs into the lungs every 6 (six) hours as needed for wheezing or shortness of breath. 1 Inhaler 0  . aspirin EC 81 MG tablet Take 1 tablet (81 mg total) by mouth daily. 90 tablet 3  . Blood Glucose Monitoring Suppl (ACCU-CHEK AVIVA PLUS) w/Device KIT 1 each by Does not apply route 3 (three) times daily. 1 kit 0  . buPROPion (WELLBUTRIN XL) 150 MG 24 hr tablet Take 3 tablets (450 mg total) daily by mouth. 30 tablet 0  . cetirizine (ZYRTEC) 10 MG tablet Take 1 tablet (10 mg total) by mouth daily. 30 tablet 1  . clonazePAM (KLONOPIN) 1 MG tablet Take 1 mg by mouth at bedtime.    [name] glucose blood (ACCU-CHEK AVIVA PLUS) test strip TEST three times a day. E11.9 100 each 12  . insulin aspart (NOVOLOG) 100 UNIT/ML FlexPen Inject 0-12 Units into the skin 3 (three) times daily with meals. As per sliding scale 15 mL 6  . Insulin Glargine (LANTUS SOLOSTAR) 100 UNIT/ML Solostar Pen Inject 70 Units into the skin daily at 10 pm. 5 pen 3  . Insulin Pen Needle 31G X 8 MM MISC Use as directed for 4 times daily insulin administration. 100 each 12  . Insulin Syringe-Needle U-100 (BD INSULIN SYRINGE ULTRAFINE) 31G  X 15/64" 0.5 ML MISC Use as directed 100 each 5  . INVOKANA 300 MG TABS tablet Take 1 tablet (300 mg total) by mouth daily. 90 tablet 1  . lisinopril (PRINIVIL,ZESTRIL) 5 MG tablet Take 1 tablet (5 mg total) daily by mouth. 90 tablet 1  . metFORMIN (GLUCOPHAGE-XR) 500 MG 24 hr tablet TAKE TWO TABLETS BY MOUTH TWICE DAILY 120 tablet 2  . metoCLOPramide (REGLAN) 5 MG tablet TAKE ONE TABLET BY MOUTH EVERY DAY 30 tablet 4  . naproxen (NAPROSYN) 500 MG tablet TAKE ONE TABLET BY MOUTH TWICE DAILY with meals 60 tablet 0  . NEEDLE, DISP, 24 G (B-D DISP NEEDLE 24GX1") 24G X 1" MISC Use with solostar pen to inject 4 times daily 200 each 1  . pantoprazole (PROTONIX)  40 MG tablet Take 1 tablet (40 mg total) daily by mouth. 30 tablet 6  . pravastatin (PRAVACHOL) 20 MG tablet Take 1 tablet (20 mg total) by mouth daily. 90 tablet 0  . traMADol (ULTRAM) 50 MG tablet Take 1 tab every 12 hours as needed for pain. 60 tablet 1  . triamcinolone cream (KENALOG) 0.1 % Apply 1 application topically 2 (two) times daily. 45 g 1  . Vitamin D, Ergocalciferol, (DRISDOL) 50000 units CAPS capsule Take 1 capsule (50,000 Units total) by mouth every 7 (seven) days. 16 capsule 0  . zolpidem (AMBIEN) 5 MG tablet Take 5 mg by mouth at bedtime.    . gabapentin (NEURONTIN) 300 MG capsule TAKE ONE CAPSULE BY MOUTH AT BEDTIME 30 capsule 1  . methocarbamol (ROBAXIN) 500 MG tablet Take 1 tablet (500 mg total) every 8 (eight) hours as needed by mouth for muscle spasms. 90 tablet 2  . nystatin (NYSTATIN) powder Apply 2 (two) times daily topically. 45 g 0  . amoxicillin (AMOXIL) 500 MG capsule Take 1 capsule (500 mg total) by mouth 3 (three) times daily. (Patient not taking: Reported on 03/12/2017) 30 capsule 0  . benzonatate (TESSALON) 100 MG capsule Take 1 capsule (100 mg total) by mouth 2 (two) times daily as needed for cough. (Patient not taking: Reported on 03/12/2017) 20 capsule 0  . nicotine (NICODERM CQ) 21 mg/24hr patch Place 1 patch (21 mg total) onto the skin daily. (Patient not taking: Reported on 02/16/2017) 28 patch 0   No facility-administered medications prior to visit.     ROS Review of Systems  Constitutional: Negative for activity change, appetite change and fatigue.  HENT: Negative for congestion, sinus pressure and sore throat.   Eyes: Negative for visual disturbance.  Respiratory: Negative for cough, chest tightness, shortness of breath and wheezing.   Cardiovascular: Negative for chest pain and palpitations.  Gastrointestinal: Negative for abdominal distention, abdominal pain and constipation.  Endocrine: Negative for polydipsia.  Genitourinary: Negative for dysuria and  frequency.  Musculoskeletal: Positive for back pain. Negative for arthralgias.  Skin: Negative for rash.  Neurological: Negative for tremors, light-headedness and numbness.  Hematological: Does not bruise/bleed easily.  Psychiatric/Behavioral: Negative for agitation and behavioral problems.    Objective:  BP 137/83   Pulse 85   Temp 98.2 F (36.8 C) (Oral)   Ht 5' 6"  (1.676 m)   Wt (!) 327 lb 12.8 oz (148.7 kg)   LMP 01/23/2014 (Approximate) Comment: no cycle since this date  SpO2 96%   BMI 52.91 kg/m   BP/Weight 03/12/2017 02/16/2017 3/71/6967  Systolic BP 893 810 175  Diastolic BP 83 69 75  Wt. (Lbs) 327.8 332.4 317  BMI 52.91 53.65 51.17  Physical Exam  Constitutional: She is oriented to person, place, and time. She appears well-developed and well-nourished.  Cardiovascular: Normal rate, normal heart sounds and intact distal pulses.  No murmur heard. Pulmonary/Chest: Effort normal and breath sounds normal. She has no wheezes. She has no rales. She exhibits no tenderness.  Abdominal: Soft. Bowel sounds are normal. She exhibits no distension and no mass. There is no tenderness.  Musculoskeletal: She exhibits tenderness (TTP of mid  lumbar spine; positive straight leg raise).  Neurological: She is alert and oriented to person, place, and time.  Skin: Skin is warm and dry.  Psychiatric: She has a normal mood and affect.    Lab Results  Component Value Date   HGBA1C 8.7 02/16/2017    Assessment & Plan:   1. Type 2 diabetes mellitus with other specified complication, with long-term current use of insulin (HCC) Uncontrolled with A1c of 8.7 Diabetic regimen had been adjusted at her last visit Continue Diabetic diet, lifestyle modifications - POCT glucose (manual entry)  2. Other diabetic neurological complication associated with type 2 diabetes mellitus (HCC) Uncontrolled Increased dose of Gabapentin - gabapentin (NEURONTIN) 300 MG capsule; Take 1 capsule (300 mg  total) by mouth 2 (two) times daily.  Dispense: 60 capsule; Refill: 2  3. Candidiasis of breast - nystatin (NYSTATIN) powder; Apply topically 2 (two) times daily.  Dispense: 45 g; Refill: 1  4. Chronic midline low back pain with right-sided sciatica Apply heat Advised that weight loss will be beneficial Increase dose of Gabapentin and Robaxin - methocarbamol (ROBAXIN) 500 MG tablet; Take 2 tablets (1,000 mg total) by mouth every 12 (twelve) hours as needed for muscle spasms.  Dispense: 120 tablet; Refill: 3   Meds ordered this encounter  Medications  . gabapentin (NEURONTIN) 300 MG capsule    Sig: Take 1 capsule (300 mg total) by mouth 2 (two) times daily.    Dispense:  60 capsule    Refill:  2  . nystatin (NYSTATIN) powder    Sig: Apply topically 2 (two) times daily.    Dispense:  45 g    Refill:  1  . methocarbamol (ROBAXIN) 500 MG tablet    Sig: Take 2 tablets (1,000 mg total) by mouth every 12 (twelve) hours as needed for muscle spasms.    Dispense:  120 tablet    Refill:  3    Follow-up: Return for follow up of chronic medical conditions, keep previously scheduled appointment.   Charlott Rakes MD

## 2017-03-12 NOTE — Progress Notes (Signed)
Patient is having pain in middle part of lower back.

## 2017-03-12 NOTE — Patient Instructions (Signed)

## 2017-03-21 ENCOUNTER — Ambulatory Visit (INDEPENDENT_AMBULATORY_CARE_PROVIDER_SITE_OTHER): Payer: Medicaid Other | Admitting: Orthopedic Surgery

## 2017-03-21 ENCOUNTER — Ambulatory Visit (INDEPENDENT_AMBULATORY_CARE_PROVIDER_SITE_OTHER): Payer: Medicaid Other

## 2017-03-21 ENCOUNTER — Encounter (INDEPENDENT_AMBULATORY_CARE_PROVIDER_SITE_OTHER): Payer: Self-pay | Admitting: Orthopedic Surgery

## 2017-03-21 DIAGNOSIS — M5441 Lumbago with sciatica, right side: Secondary | ICD-10-CM

## 2017-03-21 DIAGNOSIS — G8929 Other chronic pain: Secondary | ICD-10-CM

## 2017-03-24 NOTE — Progress Notes (Signed)
Office Visit Note   Patient: Tara Keller           Date of Birth: 1965-08-10           MRN: [id number] Visit Date: 03/21/2017 Requested by: Charlott Rakes, MD [place], [place] [address] PCP: Charlott Rakes, MD  Subjective: Chief Complaint  Patient presents with  . Lower Back - Pain    HPI: Tara Keller is a patient with low back pain and right leg radicular pain.  The pain is constant.  She has difficulty sleeping.  Been going on for 2 months.  Denies any history of injury.  Takes Ultram and muscle relaxer without much relief.  Reports pain with sitting.  The pain is keeping her awake at night.  She can only walk one half of a block.  Denies any groin pain.              ROS: All systems reviewed are negative as they relate to the chief complaint within the history of present illness.  Patient denies  fevers or chills.   Assessment & Plan: Visit Diagnoses:  1. Chronic right-sided low back pain with right-sided sciatica     Plan: Impression is 83-month history of right-sided radiculopathy refractory to nonoperative management.  Hip arthritis is mild.  Plan MRI lumbar spine evaluate right-sided radiculopathy.  Does not look surgical but I think injections could be indicated if she has symptoms on that right side which anticipate she will.  Follow-up with me after her study  Follow-Up Instructions: Return for after MRI.   Orders:  Orders Placed This Encounter  Procedures  . XR Lumbar Spine 2-3 Views  . MR Lumbar Spine w/o contrast   No orders of the defined types were placed in this encounter.     Procedures: No procedures performed   Clinical Data: No additional findings.  Objective: Vital Signs: LMP 01/23/2014 (Approximate) Comment: no cycle since this date  Physical Exam:   Constitutional: Patient appears well-developed HEENT:  Head: Normocephalic Eyes:EOM are normal Neck: Normal range of motion Cardiovascular: Normal rate Pulmonary/chest:  Effort normal Neurologic: Patient is alert Skin: Skin is warm Psychiatric: Patient has normal mood and affect    Ortho Exam: Orthopedic exam demonstrates slightly antalgic gait to the right.  No groin pain with internal/external rotation of the leg.  No masses lymphadenopathy or skin changes noted in that right leg region.  Does have positive nerve root tension signs on the right negative on the left.  Some paresthesias L5 distribution right versus left.  Negative Babinski negative clonus.  No definite paresthesias on the left but she does have some mild L5 paresthesias on the right.  No muscle atrophy in the legs.  Some pain with forward and lateral bending but no trochanteric tenderness is noted.  No other masses lymph adenopathy or skin changes noted in that back region.  Specialty Comments:  No specialty comments available.  Imaging: No results found.   PMFS History: Patient Active Problem List   Diagnosis Date Noted  . Back pain 10/18/2016  . Diabetic neuropathy ([place]) 09/15/2016  . Arthritis 09/15/2016  . Hx of adenomatous colonic polyps 08/04/2016  . OSA on CPAP 07/06/2015  . Depression (emotion) 07/06/2015  . Abnormal barium swallow   . Abdominal pain, epigastric 08/15/2013  . Benign neoplasm of colon 08/15/2013  . Special screening for malignant neoplasms, colon 08/15/2013  . Hepatic steatosis 05/19/2013  . History of pancreatitis 05/19/2013  . Family history of  colon cancer 05/19/2013  . GERD (gastroesophageal reflux disease) 02/18/2013  . Smoking 02/18/2013  . Morbid obesity ([place]) 05/28/2012  . Type 2 diabetes mellitus ([place]) 05/28/2012  . Osteoarthritis of left and right knee 05/28/2012  . Generalized anxiety disorder 05/28/2012   Past Medical History:  Diagnosis Date  . Anxiety   . Arthritis   . Asthma   . Depression   . Diabetes mellitus   . Fatty liver   . Fracture closed of upper end of forearm 9 years, Fx left left leg  . Fracture of left lower leg @ 52  years old  . H/O tubal ligation   . Hiatal hernia   . Hypertension   . Obesity   . Positive H. pylori test   . Sleep apnea    uses CPAP  . Tubular adenoma of colon     Family History  Problem Relation Age of Onset  . Diabetes Mother   . Stroke Mother   . Hypertension Mother   . Breast cancer Sister   . Heart attack Sister   . Other Daughter        died at age 6  . Cirrhosis Father   . Colon cancer Maternal Uncle     Past Surgical History:  Procedure Laterality Date  . CHOLECYSTECTOMY    . COLONOSCOPY WITH PROPOFOL N/A 08/15/2013   Procedure: COLONOSCOPY WITH PROPOFOL;  Surgeon: Jerene Bears, MD;  Location: [facility];  Service: Gastroenterology;  Laterality: N/A;  . ESOPHAGOGASTRODUODENOSCOPY N/A 05/12/2014   Procedure: ESOPHAGOGASTRODUODENOSCOPY (EGD);  Surgeon: Jerene Bears, MD;  Location: [facility];  Service: Gastroenterology;  Laterality: N/A;  . ESOPHAGOGASTRODUODENOSCOPY (EGD) WITH PROPOFOL N/A 08/15/2013   Procedure: ESOPHAGOGASTRODUODENOSCOPY (EGD) WITH PROPOFOL;  Surgeon: Jerene Bears, MD;  Location: [facility];  Service: Gastroenterology;  Laterality: N/A;  . TUBAL LIGATION Bilateral    Social History   Occupational History  . Occupation: disabled  Tobacco Use  . Smoking status: Former Smoker    Packs/day: 0.25    Types: Cigarettes    Last attempt to quit: 08/01/2016    Years since quitting: 0.6  . Smokeless tobacco: Never Used  Substance and Sexual Activity  . Alcohol use: No    Alcohol/week: 0.0 oz  . Drug use: No  . Sexual activity: Not on file

## 2017-03-27 ENCOUNTER — Ambulatory Visit (HOSPITAL_COMMUNITY)
Admission: EM | Admit: 2017-03-27 | Discharge: 2017-03-27 | Disposition: A | Discharge status: Home / Self Care | Payer: Medicaid Other | Attending: Family Medicine | Admitting: Family Medicine

## 2017-03-27 ENCOUNTER — Encounter (HOSPITAL_COMMUNITY): Payer: Self-pay | Admitting: Family Medicine

## 2017-03-27 DIAGNOSIS — J069 Acute upper respiratory infection, unspecified: Secondary | ICD-10-CM | POA: Diagnosis not present

## 2017-03-27 MED ORDER — IPRATROPIUM BROMIDE 0.03 % NA SOLN: 2.0000 | Freq: Two times a day (BID) | NASAL | 0 refills | Status: DC

## 2017-03-27 MED ORDER — KETOCONAZOLE 2 % EX CREA: 1.0000 "application " | TOPICAL_CREAM | Freq: Two times a day (BID) | CUTANEOUS | 0 refills | Status: DC

## 2017-03-27 MED ORDER — PREDNISONE 20 MG PO TABS: ORAL_TABLET | ORAL | 0 refills | Status: DC

## 2017-03-27 NOTE — ED Provider Notes (Signed)
[place]   [id number] 03/27/17 Arrival Time: 51   SUBJECTIVE:  Tara Keller is a 52 y.o. female who presents to the urgent care with complaint of URI symptoms.  Currently taking amoxicillin and zyrtec.  Notes cough and runny nose despite the amoxicillin.  Symptoms began 2 weeks ago.  No fever  Home maker.  Diabetic.  Also notes rash under breasts not improving with Nystatin powder.   Past Medical History:  Diagnosis Date  . Anxiety   . Arthritis   . Asthma   . Depression   . Diabetes mellitus   . Fatty liver   . Fracture closed of upper end of forearm 9 years, Fx left left leg  . Fracture of left lower leg @ 52 years old  . H/O tubal ligation   . Hiatal hernia   . Hypertension   . Obesity   . Positive H. pylori test   . Sleep apnea    uses CPAP  . Tubular adenoma of colon    Family History  Problem Relation Age of Onset  . Diabetes Mother   . Stroke Mother   . Hypertension Mother   . Breast cancer Sister   . Heart attack Sister   . Other Daughter        died at age 65  . Cirrhosis Father   . Colon cancer Maternal Uncle    Social History   Socioeconomic History  . Marital status: Single    Spouse name: Not on file  . Number of children: 4  . Years of education: Not on file  . Highest education level: Not on file  Social Needs  . Financial resource strain: Not on file  . Food insecurity - worry: Not on file  . Food insecurity - inability: Not on file  . Transportation needs - medical: Not on file  . Transportation needs - non-medical: Not on file  Occupational History  . Occupation: disabled  Tobacco Use  . Smoking status: Former Smoker    Packs/day: 0.25    Types: Cigarettes    Last attempt to quit: 08/01/2016    Years since quitting: 0.6  . Smokeless tobacco: Never Used  Substance and Sexual Activity  . Alcohol use: No    Alcohol/week: 0.0 oz  . Drug use: No  . Sexual activity: Not on file  Other Topics Concern  . Not on file   Social History Narrative   She lives with fiance.  Her daughter died in May 01, 2015 at the age of 64.   She is on disability since 05/01/1990   Highest level of education:  Working on GED   No outpatient medications have been marked as taking for the 03/27/17 encounter [facility] Encounter).   No Known Allergies    ROS: As per HPI, remainder of ROS negative.   OBJECTIVE:   Vitals:   03/27/17 1041  BP: (!) 158/89  Pulse: 87  Resp: 18  Temp: 97.9 F (36.6 C)  TempSrc: Oral  SpO2: 98%     General appearance: alert; no distress Eyes: PERRL; EOMI; conjunctiva normal HENT: normocephalic; atraumatic; TMs normal, canal normal, external ears normal without trauma; nasal mucosa normal; oral mucosa normal Neck: supple Lungs: clear to auscultation bilaterally Heart: regular rate and rhythm Back: no CVA tenderness Extremities: no cyanosis or edema; symmetrical with no gross deformities Skin: warm and dry; tinea cruris below breasts. Neurologic: normal gait; grossly normal Psychological: alert and cooperative; normal mood and affect  Labs:  Results for orders placed or performed in visit on 03/12/17  POCT glucose (manual entry)  Result Value Ref Range   POC Glucose 349 (A) 70 - 99 mg/dl    Labs Reviewed - No data to display  No results found.     ASSESSMENT & PLAN:  1. Viral upper respiratory tract infection     Meds ordered this encounter  Medications  . predniSONE (DELTASONE) 20 MG tablet    Sig: one daily with food    Dispense:  5 tablet    Refill:  0  . ipratropium (ATROVENT) 0.03 % nasal spray    Sig: Place 2 sprays into both nostrils 2 (two) times daily.    Dispense:  30 mL    Refill:  0  . ketoconazole (NIZORAL) 2 % cream    Sig: Apply 1 application topically 2 (two) times daily.    Dispense:  60 g    Refill:  0    Reviewed expectations re: course of current medical issues. Questions answered. Outlined signs and symptoms indicating need for more acute  intervention. Patient verbalized understanding. After Visit Summary given.    Procedures:      Robyn Haber, MD 03/27/17 1054

## 2017-03-27 NOTE — ED Triage Notes (Signed)
Pt here for URI sx; pt sts taking amoxi and zyrtec

## 2017-04-01 ENCOUNTER — Other Ambulatory Visit: Payer: Medicaid Other

## 2017-04-03 ENCOUNTER — Telehealth (INDEPENDENT_AMBULATORY_CARE_PROVIDER_SITE_OTHER): Payer: Self-pay

## 2017-04-03 DIAGNOSIS — M545 Low back pain: Secondary | ICD-10-CM

## 2017-04-03 NOTE — Telephone Encounter (Signed)
Please see note from [place] about patients MRI scan. Do you want to proceed with peer to peer?      Maureen Chatters, CMA  Fritzi Scripter, RT        Medicaid denied MRI lumbar spine: Rational is 1). Failure to improve after recent 6 week trial of provider-directed treatment with clinical reevaluation, or 2) and signs or symptoms such as significant motor weakness, malignancy, infection, cauda equina syndrome, for which conservative treatment is not needed. The clinical information received fails to support meeting these requirements and, therefore, the request is not indicated at this tiime.   I did a reconsideration to avoid the peer to peer and still DENIED. If ordering provider wants to do a peer to peer please call [phone] and use case # [id number].   Thanks

## 2017-04-04 NOTE — Telephone Encounter (Signed)
Please let her know that we cannot get the scan until she has had 6 weeks of conservative treatment.  I would send her to therapy once or twice and have her do a home exercise program then come back again in 6 weeks and will order the scan at that point please let her know that this is per Medicaid guidelines and not my guidelines thanks

## 2017-04-05 NOTE — Telephone Encounter (Signed)
IC s/w patient and advised. She verbalized understanding. Referral for P.T made.

## 2017-04-05 NOTE — Addendum Note (Signed)
Addended byLaurann Montana on: 04/05/2017 09:05 AM   Modules accepted: Orders

## 2017-04-13 ENCOUNTER — Encounter: Payer: Self-pay | Admitting: Family Medicine

## 2017-04-13 ENCOUNTER — Ambulatory Visit: Payer: Medicaid Other | Attending: Family Medicine | Admitting: Family Medicine

## 2017-04-13 VITALS — BP 116/77 | HR 91 | Temp 97.9°F | Ht 66.0 in | Wt 327.0 lb

## 2017-04-13 DIAGNOSIS — K449 Diaphragmatic hernia without obstruction or gangrene: Secondary | ICD-10-CM | POA: Insufficient documentation

## 2017-04-13 DIAGNOSIS — Z9889 Other specified postprocedural states: Secondary | ICD-10-CM | POA: Insufficient documentation

## 2017-04-13 DIAGNOSIS — E1169 Type 2 diabetes mellitus with other specified complication: Secondary | ICD-10-CM | POA: Insufficient documentation

## 2017-04-13 DIAGNOSIS — Z7982 Long term (current) use of aspirin: Secondary | ICD-10-CM | POA: Diagnosis not present

## 2017-04-13 DIAGNOSIS — Z9049 Acquired absence of other specified parts of digestive tract: Secondary | ICD-10-CM | POA: Insufficient documentation

## 2017-04-13 DIAGNOSIS — M5441 Lumbago with sciatica, right side: Secondary | ICD-10-CM | POA: Diagnosis not present

## 2017-04-13 DIAGNOSIS — Z79899 Other long term (current) drug therapy: Secondary | ICD-10-CM | POA: Insufficient documentation

## 2017-04-13 DIAGNOSIS — I10 Essential (primary) hypertension: Secondary | ICD-10-CM | POA: Insufficient documentation

## 2017-04-13 DIAGNOSIS — E669 Obesity, unspecified: Secondary | ICD-10-CM | POA: Insufficient documentation

## 2017-04-13 DIAGNOSIS — M5416 Radiculopathy, lumbar region: Secondary | ICD-10-CM | POA: Insufficient documentation

## 2017-04-13 DIAGNOSIS — E1143 Type 2 diabetes mellitus with diabetic autonomic (poly)neuropathy: Secondary | ICD-10-CM | POA: Diagnosis not present

## 2017-04-13 DIAGNOSIS — F419 Anxiety disorder, unspecified: Secondary | ICD-10-CM | POA: Insufficient documentation

## 2017-04-13 DIAGNOSIS — G4730 Sleep apnea, unspecified: Secondary | ICD-10-CM | POA: Insufficient documentation

## 2017-04-13 DIAGNOSIS — Z794 Long term (current) use of insulin: Secondary | ICD-10-CM | POA: Diagnosis not present

## 2017-04-13 DIAGNOSIS — Z7952 Long term (current) use of systemic steroids: Secondary | ICD-10-CM | POA: Insufficient documentation

## 2017-04-13 DIAGNOSIS — J45909 Unspecified asthma, uncomplicated: Secondary | ICD-10-CM | POA: Insufficient documentation

## 2017-04-13 DIAGNOSIS — Z9851 Tubal ligation status: Secondary | ICD-10-CM | POA: Diagnosis not present

## 2017-04-13 DIAGNOSIS — G8929 Other chronic pain: Secondary | ICD-10-CM | POA: Diagnosis not present

## 2017-04-13 DIAGNOSIS — F329 Major depressive disorder, single episode, unspecified: Secondary | ICD-10-CM | POA: Diagnosis not present

## 2017-04-13 DIAGNOSIS — K3184 Gastroparesis: Secondary | ICD-10-CM | POA: Insufficient documentation

## 2017-04-13 LAB — GLUCOSE, POCT (MANUAL RESULT ENTRY): POC Glucose: 313 mg/dl — AB (ref 70–99)

## 2017-04-13 MED ORDER — TRAMADOL HCL 50 MG PO TABS: 50.0000 mg | ORAL_TABLET | Freq: Two times a day (BID) | ORAL | 1 refills | Status: DC | PRN

## 2017-04-13 MED ORDER — METFORMIN HCL ER 500 MG PO TB24: 1000.0000 mg | ORAL_TABLET | Freq: Two times a day (BID) | ORAL | 2 refills | Status: DC

## 2017-04-13 MED ORDER — PRAVASTATIN SODIUM 20 MG PO TABS: 20.0000 mg | ORAL_TABLET | Freq: Every day | ORAL | 0 refills | Status: DC

## 2017-04-13 NOTE — Patient Instructions (Signed)

## 2017-04-13 NOTE — Progress Notes (Signed)
Subjective:  Patient ID: Tara Keller, female    DOB: 1965-02-21  Age: 52 y.o. MRN: [id number]  CC: Diabetes and Back Pain   HPI Tara Keller  is a 52 year old female with a history of uncontrolled type 2 diabetes mellitus (A1c 8.7 ), diabetic neuropathy, GERD, gastroparesis, depression and anxiety who presents today for follow up of right sided sciatica which has led to a couple of falls with the most recent one 3 days ago and she now ambulates with a cane. Pain radiates down her right lower extremity and has been present for close to six weeks with no preceding history of trauma. Pain increases with prolonged sitting and is associated with tingling. She denies loss of sphincteric function. Seen by Orthopedic, Dr Marlou Sa and MRI spine which was ordered was denies by her insurance company and so she was referred to rehab but is yet to obtain an appointment. She is requesting a refill on Tramadol  Past Medical History:  Diagnosis Date  . Anxiety   . Arthritis   . Asthma   . Depression   . Diabetes mellitus   . Fatty liver   . Fracture closed of upper end of forearm 9 years, Fx left left leg  . Fracture of left lower leg @ 51 years old  . H/O tubal ligation   . Hiatal hernia   . Hypertension   . Obesity   . Positive H. pylori test   . Sleep apnea    uses CPAP  . Tubular adenoma of colon     Past Surgical History:  Procedure Laterality Date  . CHOLECYSTECTOMY    . COLONOSCOPY WITH PROPOFOL N/A 08/15/2013   Procedure: COLONOSCOPY WITH PROPOFOL;  Surgeon: Jerene Bears, MD;  Location: [facility];  Service: Gastroenterology;  Laterality: N/A;  . ESOPHAGOGASTRODUODENOSCOPY N/A 05/12/2014   Procedure: ESOPHAGOGASTRODUODENOSCOPY (EGD);  Surgeon: Jerene Bears, MD;  Location: [facility];  Service: Gastroenterology;  Laterality: N/A;  . ESOPHAGOGASTRODUODENOSCOPY (EGD) WITH PROPOFOL N/A 08/15/2013   Procedure: ESOPHAGOGASTRODUODENOSCOPY (EGD) WITH PROPOFOL;  Surgeon: Jerene Bears, MD;   Location: [facility];  Service: Gastroenterology;  Laterality: N/A;  . TUBAL LIGATION Bilateral     No Known Allergies   Outpatient Medications Prior to Visit  Medication Sig Dispense Refill  . ACCU-CHEK FASTCLIX LANCETS MISC Use as directed 100 each 12  . albuterol (PROVENTIL HFA;VENTOLIN HFA) 108 (90 Base) MCG/ACT inhaler Inhale 1-2 puffs into the lungs every 6 (six) hours as needed for wheezing or shortness of breath. 1 Inhaler 0  . aspirin EC 81 MG tablet Take 1 tablet (81 mg total) by mouth daily. 90 tablet 3  . Blood Glucose Monitoring Suppl (ACCU-CHEK AVIVA PLUS) w/Device KIT 1 each by Does not apply route 3 (three) times daily. 1 kit 0  . buPROPion (WELLBUTRIN XL) 150 MG 24 hr tablet Take 3 tablets (450 mg total) daily by mouth. 30 tablet 0  . clonazePAM (KLONOPIN) 1 MG tablet Take 1 mg by mouth at bedtime.    . gabapentin (NEURONTIN) 300 MG capsule Take 1 capsule (300 mg total) by mouth 2 (two) times daily. 60 capsule 2  . glucose blood (ACCU-CHEK AVIVA PLUS) test strip TEST three times a day. E11.9 100 each 12  . insulin aspart (NOVOLOG) 100 UNIT/ML FlexPen Inject 0-12 Units into the skin 3 (three) times daily with meals. As per sliding scale 15 mL 6  . Insulin Glargine (LANTUS SOLOSTAR) 100 UNIT/ML Solostar Pen Inject 70 Units into the  skin daily at 10 pm. 5 pen 3  . Insulin Pen Needle 31G X 8 MM MISC Use as directed for 4 times daily insulin administration. 100 each 12  . Insulin Syringe-Needle U-100 (BD INSULIN SYRINGE ULTRAFINE) 31G X 15/64" 0.5 ML MISC Use as directed 100 each 5  . INVOKANA 300 MG TABS tablet Take 1 tablet (300 mg total) by mouth daily. 90 tablet 1  . ipratropium (ATROVENT) 0.03 % nasal spray Place 2 sprays into both nostrils 2 (two) times daily. 30 mL 0  . ketoconazole (NIZORAL) 2 % cream Apply 1 application topically 2 (two) times daily. 60 g 0  . lisinopril (PRINIVIL,ZESTRIL) 5 MG tablet Take 1 tablet (5 mg total) daily by mouth. 90 tablet 1  .  methocarbamol (ROBAXIN) 500 MG tablet Take 2 tablets (1,000 mg total) by mouth every 12 (twelve) hours as needed for muscle spasms. 120 tablet 3  . metoCLOPramide (REGLAN) 5 MG tablet TAKE ONE TABLET BY MOUTH EVERY DAY 30 tablet 4  . NEEDLE, DISP, 24 G (B-D DISP NEEDLE 24GX1") 24G X 1" MISC Use with solostar pen to inject 4 times daily 200 each 1  . pantoprazole (PROTONIX) 40 MG tablet Take 1 tablet (40 mg total) daily by mouth. 30 tablet 6  . predniSONE (DELTASONE) 20 MG tablet one daily with food 5 tablet 0  . triamcinolone cream (KENALOG) 0.1 % Apply 1 application topically 2 (two) times daily. 45 g 1  . Vitamin D, Ergocalciferol, (DRISDOL) 50000 units CAPS capsule Take 1 capsule (50,000 Units total) by mouth every 7 (seven) days. 16 capsule 0  . zolpidem (AMBIEN) 5 MG tablet Take 5 mg by mouth at bedtime.    . metFORMIN (GLUCOPHAGE-XR) 500 MG 24 hr tablet TAKE TWO TABLETS BY MOUTH TWICE DAILY 120 tablet 2  . pravastatin (PRAVACHOL) 20 MG tablet Take 1 tablet (20 mg total) by mouth daily. 90 tablet 0   No facility-administered medications prior to visit.     ROS Review of Systems  Constitutional: Negative for activity change, appetite change and fatigue.  HENT: Negative for congestion, sinus pressure and sore throat.   Eyes: Negative for visual disturbance.  Respiratory: Negative for cough, chest tightness, shortness of breath and wheezing.   Cardiovascular: Negative for chest pain and palpitations.  Gastrointestinal: Negative for abdominal distention, abdominal pain and constipation.  Endocrine: Negative for polydipsia.  Genitourinary: Negative for dysuria and frequency.  Musculoskeletal:       See hpi  Skin: Negative for rash.  Neurological: Negative for tremors, light-headedness and numbness.  Hematological: Does not bruise/bleed easily.  Psychiatric/Behavioral: Negative for agitation and behavioral problems.    Objective:  BP 116/77   Pulse 91   Temp 97.9 F (36.6 C) (Oral)    Ht _0  (1.676 m)   Wt (!) 327 lb (148.3 kg)   LMP 01/23/2014 (Approximate) Comment: no cycle since this date  BMI 52.78 kg/m   BP/Weight 04/13/2017 0/71/2197 05/17/8323  Systolic BP 498 264 158  Diastolic BP 77 89 83  Wt. (Lbs) 327 - 327.8  BMI 52.78 - 52.91      Physical Exam  Constitutional: She is oriented to person, place, and time. She appears well-developed and well-nourished.  Cardiovascular: Normal rate, normal heart sounds and intact distal pulses.  No murmur heard. Pulmonary/Chest: Effort normal and breath sounds normal. She has no wheezes. She has no rales. She exhibits no tenderness.  Abdominal: Soft. Bowel sounds are normal. She exhibits no distension and no  mass. There is no tenderness.  Musculoskeletal: She exhibits tenderness (TTP of right lumbar region extending into buttock; positive straight leg raise on the right, negative on the left).  Neurological: She is alert and oriented to person, place, and time.  Psychiatric: She has a normal mood and affect.     Lab Results  Component Value Date   HGBA1C 8.7 02/16/2017    Assessment & Plan:   1. Type 2 diabetes mellitus with other specified complication, with long-term current use of insulin (HCC) Uncontrolled with A1c of 8.7 Regimen was adjusted at her last OV Diabetic diet, lifestyle modifications - POCT glucose (manual entry)  2. Lumbar radiculopathy Recently seen by Orthopedics Awaiting rehab appointment  3. Chronic midline low back pain with right-sided sciatica Refilled Tramadol - she is not currently on a pain contract Advised that if she continues to require Tramadol after rehab intervention I might have to refer her to Pain mgt.  4. Type 2 diabetes mellitus with other specified complication, without long-term current use of insulin (HCC) - metFORMIN (GLUCOPHAGE-XR) 500 MG 24 hr tablet; Take 2 tablets (1,000 mg total) by mouth 2 (two) times daily.  Dispense: 120 tablet; Refill: 2   Meds  ordered this encounter  Medications  . traMADol (ULTRAM) 50 MG tablet    Sig: Take 1 tablet (50 mg total) by mouth every 12 (twelve) hours as needed.    Dispense:  60 tablet    Refill:  1  . pravastatin (PRAVACHOL) 20 MG tablet    Sig: Take 1 tablet (20 mg total) by mouth daily.    Dispense:  90 tablet    Refill:  0  . metFORMIN (GLUCOPHAGE-XR) 500 MG 24 hr tablet    Sig: Take 2 tablets (1,000 mg total) by mouth 2 (two) times daily.    Dispense:  120 tablet    Refill:  2    Follow-up: Return in about 2 months (around 06/13/2017) for follow up on diabetes mellitus.   Charlott Rakes MD

## 2017-04-16 ENCOUNTER — Other Ambulatory Visit: Payer: Self-pay

## 2017-04-16 ENCOUNTER — Ambulatory Visit: Payer: Medicaid Other | Attending: Orthopedic Surgery

## 2017-04-16 DIAGNOSIS — G8929 Other chronic pain: Secondary | ICD-10-CM | POA: Insufficient documentation

## 2017-04-16 DIAGNOSIS — M6281 Muscle weakness (generalized): Secondary | ICD-10-CM | POA: Insufficient documentation

## 2017-04-16 DIAGNOSIS — M62830 Muscle spasm of back: Secondary | ICD-10-CM | POA: Diagnosis present

## 2017-04-16 DIAGNOSIS — M545 Low back pain: Secondary | ICD-10-CM | POA: Diagnosis present

## 2017-04-16 DIAGNOSIS — R293 Abnormal posture: Secondary | ICD-10-CM | POA: Diagnosis present

## 2017-04-16 NOTE — Therapy (Addendum)
[place], [place], [address] Phone: [phone]   Fax:  [phone]  Physical Therapy Evaluation/Discharge  Patient Details  Name: Tara Keller MRN: [id number] Date of Birth: 06-28-1965 Referring Provider: Mittie Bodo, MD   Encounter Date: 04/16/2017  PT End of Session - 04/16/17 1502    Visit Number  1    Number of Visits  16    Date for PT Re-Evaluation  06/22/17    Authorization Type  MDC    PT Start Time  0300    PT Stop Time  0345    PT Time Calculation (min)  45 min    Activity Tolerance  Patient tolerated treatment well    Behavior During Therapy  [facility] for tasks assessed/performed       Past Medical History:  Diagnosis Date  . Anxiety   . Arthritis   . Asthma   . Depression   . Diabetes mellitus   . Fatty liver   . Fracture closed of upper end of forearm 9 years, Fx left left leg  . Fracture of left lower leg @ 52 years old  . H/O tubal ligation   . Hiatal hernia   . Hypertension   . Obesity   . Positive H. pylori test   . Sleep apnea    uses CPAP  . Tubular adenoma of colon     Past Surgical History:  Procedure Laterality Date  . CHOLECYSTECTOMY    . COLONOSCOPY WITH PROPOFOL N/A 08/15/2013   Procedure: COLONOSCOPY WITH PROPOFOL;  Surgeon: Jerene Bears, MD;  Location: [facility];  Service: Gastroenterology;  Laterality: N/A;  . ESOPHAGOGASTRODUODENOSCOPY N/A 05/12/2014   Procedure: ESOPHAGOGASTRODUODENOSCOPY (EGD);  Surgeon: Jerene Bears, MD;  Location: [facility];  Service: Gastroenterology;  Laterality: N/A;  . ESOPHAGOGASTRODUODENOSCOPY (EGD) WITH PROPOFOL N/A 08/15/2013   Procedure: ESOPHAGOGASTRODUODENOSCOPY (EGD) WITH PROPOFOL;  Surgeon: Jerene Bears, MD;  Location: [facility];  Service: Gastroenterology;  Laterality: N/A;  . TUBAL LIGATION Bilateral     There were no vitals filed for this visit.   Subjective Assessment - 04/16/17 1511    Subjective  Lower back pain for 2  years. She reports fall on knees and Buttock pain constant and with standing pain increases.  MD wanted MRI but [employer] does not pay at this time an sent her here.        Limitations  Walking;Standing;House hold activities weakness in leg.   Using [facility] for 2 years    How long can you sit comfortably?  20 min    How long can you stand comfortably?  with cane 20 min    How long can you walk comfortably?  with cane   150 feet    Diagnostic tests  xrays:  DDD mild, OA hips    Patient Stated Goals  More RT leg strength with less pain    Currently in Pain?  Yes    Pain Score  8     Pain Location  Back    Pain Orientation  Lower;Right;Left RT more    Pain Descriptors / Indicators  Dull;Aching stab    Pain Type  Chronic pain    Pain Radiating Towards  lateral to lower leg    Pain Onset  More than a month ago    Pain Frequency  Constant    Aggravating Factors   dishes, cleaning ,,  longer periods.     Pain Relieving Factors  medication,  muscle  relaxors.          [facility] PT Assessment - 04/16/17 0001      Assessment   Medical Diagnosis  RT lower back pain    Referring Provider  Mittie Bodo, MD    Onset Date/Surgical Date  -- 2 years ago    Next MD Visit  As needed    Prior Therapy  No      Precautions   Precautions  None      Restrictions   Weight Bearing Restrictions  No      Balance Screen   Has the patient fallen in the past 6 months  Yes    How many times?  2 knee buckled    Has the patient had a decrease in activity level because of a fear of falling?   Yes    Is the patient reluctant to leave their home because of a fear of falling?   No      Home Film/video editor residence    Living Arrangements  Other (Comment)    Home Access  Stairs to enter    Entrance Stairs-Number of Steps  7    Entrance Stairs-Rails  Right    Home Layout  One level      Prior Function   Level of [place]  Unemployed      Posture/Postural Control    Posture Comments  RT ilia higher than Lt       ROM / Strength   AROM / PROM / Strength  AROM;Strength;PROM      AROM   AROM Assessment Site  Lumbar    Lumbar Flexion  40    Lumbar Extension  10    Lumbar - Right Side Bend  10    Lumbar - Left Side Bend  10      PROM   Overall PROM Comments  ER decr Lt not RT , IR decr RT  not LT.  hip flex decr bilaterally from girth but RT more painful.       Strength   Overall Strength Comments  both LE 4+/5 or better .  POOR abdominal tone/ strength      Flexibility   Soft Tissue Assessment /Muscle Length  yes    Hamstrings  assissted to 70 degrees       Palpation   Palpation comment  tender RT gluteals and lower lumbar paraspinals  and flank.  ,  LT leg shorter,       Ambulation/Gait   Gait Comments  SPC antalgic gait                 Objective measurements completed on examination: See above findings.                PT Short Term Goals - 04/16/17 1649      PT SHORT TERM GOAL #1   Title  She will be indpendent with initial HEP    Baseline  no  program    Time  2    Period  Weeks    Status  New      PT SHORT TERM GOAL #2   Title  She will report pain decreased 10-15 % generally    Baseline  pain generally 8/10 or worse    Time  2    Period  Weeks    Status  New        PT Long Term Goals -  04/16/17 1650      PT LONG TERM GOAL #1   Title  She will be indpenedent with all HEp issued    Baseline  independent with initial hEP    Time  10    Period  Weeks    Status  New      PT LONG TERM GOAL #2   Title  She will report 50% decr gneral pain in RT hip and LB    Baseline  10-15% improved    Time  10    Period  Weeks    Status  New      PT LONG TERM GOAL #3   Title  She will report able to walk 300 feet or more with max pain 4-5/10    Baseline  pain genrally 8/10 at eval    Time  10    Period  Weeks    Status  New      PT LONG TERM GOAL #4   Title  She will report able to sit for 30 min with  max pain 4-5/10    Baseline  only able to sit for 20 min at eval     Time  10    Period  Weeks    Status  New      PT LONG TERM GOAL #5   Title  She will report no sensation or incidents of RT leg instability    Time  10    Period  Weeks    Status  New             Plan - 04/16/17 1654    Clinical Impression Statement  Ms Alessandrini presents with chronic lower back pain RT > LT  with pain into Rt hip and leg to ankle. She has some OA in hips which may contribute to RT sided pain.   She has signif pain and spasm in gluteals more so than paraspinals . She has terrible core strengthShe is unlevel in pelvis and may benefit from heel lift in shoe.         History and Personal Factors relevant to plan of care:  obesity , chronicity of pain , OA in multiple areas.  core weakness    Clinical Presentation  Evolving    Clinical Decision Making  Moderate    Rehab Potential  Good    PT Frequency  -- 3 visits    PT Duration  -- over 2 weeks then   2x/week for 6 weeks    PT Treatment/Interventions  Electrical Stimulation;Iontophoresis 66m/ml Dexamethasone;Moist Heat;Ultrasound;Gait training;Therapeutic exercise;Patient/family education;Taping;Passive range of motion;Manual techniques;Dry needling    PT Next Visit Plan  STW, HEP , stretching , modalities    Consulted and Agree with Plan of Care  Patient       Patient will benefit from skilled therapeutic intervention in order to improve the following deficits and impairments:  Pain, Obesity, Difficulty walking, Decreased activity tolerance, Decreased endurance, Decreased range of motion, Decreased strength, Postural dysfunction  Visit Diagnosis: Chronic right-sided low back pain, with sciatica presence unspecified  Abnormal posture  Muscle spasm of back  Muscle weakness (generalized)     Problem List Patient Active Problem List   Diagnosis Date Noted  . Lumbar radiculopathy 04/13/2017  . Back pain 10/18/2016  . Diabetic neuropathy  [place] 09/15[place]2016  . Arthritis 09/15/2016  . Hx of adenomatous colonic polyps 08/04/2016  . OSA on CPAP 07/06/2015  . Depression (emotion) 07/06/2015  . Abnormal barium swallow   .  Abdominal pain, epigastric 08/15/2013  . Benign neoplasm of colon 08/15/2013  . Special screening for malignant neoplasms, colon 08/15/2013  . Hepatic steatosis 05/19/2013  . History of pancreatitis 05/19/2013  . Family history of colon cancer 05/19/2013  . GERD (gastroesophageal reflux disease) 02/18/2013  . Smoking 02/18/2013  . Morbid obesity ([place]) 05/28/2012  . Type 2 diabetes mellitus ([place]) 05/28/2012  . Osteoarthritis of left and right knee 05/28/2012  . Generalized anxiety disorder 05/28/2012    Darrel Hoover  PT 04/16/2017, 5:00 PM  [facility] [address] [place], [place], [address] Phone: [phone]   Fax:  [phone]  Name: Tara Keller MRN: [id number] Date of Birth: 06/03/1965 PHYSICAL THERAPY DISCHARGE SUMMARY  Visits from Start of Care: 1  Current functional level related to goals / functional outcomes: She canceled appointments due to being OOT and has not returned to PT   Remaining deficits: Unknown   Education / Equipment: NA  Plan:                                                    Patient goals were not met. Patient is being discharged due to not returning since the last visit.  ?????   Pearson Forster PT    05/22/17

## 2017-04-23 ENCOUNTER — Other Ambulatory Visit: Payer: Self-pay | Admitting: Family Medicine

## 2017-04-23 DIAGNOSIS — K219 Gastro-esophageal reflux disease without esophagitis: Secondary | ICD-10-CM

## 2017-05-12 ENCOUNTER — Other Ambulatory Visit: Payer: Self-pay | Admitting: Family Medicine

## 2017-05-23 ENCOUNTER — Ambulatory Visit: Payer: Medicaid Other | Admitting: Podiatry

## 2017-05-28 ENCOUNTER — Emergency Department (HOSPITAL_COMMUNITY)
Admission: EM | Admit: 2017-05-28 | Discharge: 2017-05-28 | Disposition: A | Discharge status: Home / Self Care | Payer: Medicaid Other | Attending: Emergency Medicine | Admitting: Emergency Medicine

## 2017-05-28 ENCOUNTER — Other Ambulatory Visit: Payer: Self-pay

## 2017-05-28 ENCOUNTER — Encounter (HOSPITAL_COMMUNITY): Payer: Self-pay | Admitting: Emergency Medicine

## 2017-05-28 ENCOUNTER — Emergency Department (HOSPITAL_COMMUNITY): Payer: Medicaid Other

## 2017-05-28 DIAGNOSIS — Z79899 Other long term (current) drug therapy: Secondary | ICD-10-CM | POA: Insufficient documentation

## 2017-05-28 DIAGNOSIS — J309 Allergic rhinitis, unspecified: Secondary | ICD-10-CM | POA: Insufficient documentation

## 2017-05-28 DIAGNOSIS — E119 Type 2 diabetes mellitus without complications: Secondary | ICD-10-CM | POA: Diagnosis not present

## 2017-05-28 DIAGNOSIS — Z794 Long term (current) use of insulin: Secondary | ICD-10-CM | POA: Diagnosis not present

## 2017-05-28 DIAGNOSIS — J45909 Unspecified asthma, uncomplicated: Secondary | ICD-10-CM | POA: Insufficient documentation

## 2017-05-28 DIAGNOSIS — I10 Essential (primary) hypertension: Secondary | ICD-10-CM | POA: Diagnosis not present

## 2017-05-28 DIAGNOSIS — Z87891 Personal history of nicotine dependence: Secondary | ICD-10-CM | POA: Diagnosis not present

## 2017-05-28 DIAGNOSIS — B373 Candidiasis of vulva and vagina: Secondary | ICD-10-CM

## 2017-05-28 DIAGNOSIS — B3731 Acute candidiasis of vulva and vagina: Secondary | ICD-10-CM

## 2017-05-28 DIAGNOSIS — Z7982 Long term (current) use of aspirin: Secondary | ICD-10-CM | POA: Insufficient documentation

## 2017-05-28 DIAGNOSIS — N898 Other specified noninflammatory disorders of vagina: Secondary | ICD-10-CM | POA: Diagnosis present

## 2017-05-28 LAB — URINALYSIS, ROUTINE W REFLEX MICROSCOPIC
BACTERIA UA: NONE SEEN
Bilirubin Urine: NEGATIVE
Glucose, UA: >=500 mg/dL — AB
KETONES UR: 5 mg/dL — AB
Leukocytes, UA: NEGATIVE
Nitrite: NEGATIVE
PH: 7 (ref 5.0–8.0)
Protein, ur: NEGATIVE mg/dL
SPECIFIC GRAVITY, URINE: 1.025 (ref 1.005–1.030)

## 2017-05-28 LAB — WET PREP, GENITAL
Clue Cells Wet Prep HPF POC: NONE SEEN
Sperm: NONE SEEN
Trich, Wet Prep: NONE SEEN
Yeast Wet Prep HPF POC: NONE SEEN

## 2017-05-28 MED ORDER — FLUCONAZOLE 150 MG PO TABS: 150.0000 mg | ORAL_TABLET | Freq: Every day | ORAL | 0 refills | Status: AC

## 2017-05-28 MED ORDER — FLUCONAZOLE 150 MG PO TABS
150.0000 mg | ORAL_TABLET | Freq: Once | ORAL | Status: AC
  Administered 2017-05-28: 150 mg via ORAL
  Filled 2017-05-28: qty 1

## 2017-05-28 MED ORDER — BENZONATATE 100 MG PO CAPS: 100.0000 mg | ORAL_CAPSULE | Freq: Three times a day (TID) | ORAL | 0 refills | Status: DC

## 2017-05-28 MED ORDER — LORATADINE 10 MG PO TABS: 10.0000 mg | ORAL_TABLET | Freq: Every day | ORAL | 0 refills | Status: DC

## 2017-05-28 NOTE — ED Notes (Signed)
Pt upset because she has been waiting since 8 this morning. Pt stated "I will never come back this dont make any sense." Pt was informed the people coming back before her was for FT, pt is getting undressed ana was informed that she will be seen soon.

## 2017-05-28 NOTE — Discharge Instructions (Signed)
Please read attached information. If you experience any new or worsening signs or symptoms please return to the emergency room for evaluation. Please follow-up with your primary care provider or specialist as discussed. Please use medication prescribed only as directed and discontinue taking if you have any concerning signs or symptoms.   °

## 2017-05-28 NOTE — ED Provider Notes (Signed)
[facility] EMERGENCY DEPARTMENT Provider Note   CSN: [id number] Arrival date & time: 05/28/17  4481     History   Chief Complaint Chief Complaint  Patient presents with  . Vaginal Itching  . Cough    HPI Tara Keller is a 52 y.o. female.  HPI   52 year old female presents today with several complaints.  Patient reports a one-week history of vaginal irritation.  She notes extreme itching, pain with a darkish discharge.  She notes she is very infrequently sexually active.  She denies any associated abdominal pain, nausea, vomiting, fever.  Patient denies any history of same.  Patient also reports that she has had a dry nonproductive cough only at night.  She notes rhinorrhea for the last week, she denies any productive cough, fever, chest pain or shortness of breath.  She notes she has been using Flonase without symptomatic improvement.  Patient reports a history of diabetes, notes that she is taking insulin, but notes her blood sugar has been in the 200 range recently.  Past Medical History:  Diagnosis Date  . Anxiety   . Arthritis   . Asthma   . Depression   . Diabetes mellitus   . Fatty liver   . Fracture closed of upper end of forearm 9 years, Fx left left leg  . Fracture of left lower leg @ 52 years old  . H/O tubal ligation   . Hiatal hernia   . Hypertension   . Obesity   . Positive H. pylori test   . Sleep apnea    uses CPAP  . Tubular adenoma of colon     Patient Active Problem List   Diagnosis Date Noted  . Lumbar radiculopathy 04/13/2017  . Back pain 10/18/2016  . Diabetic neuropathy ([place]) 09/15/2016  . Arthritis 09/15/2016  . Hx of adenomatous colonic polyps 08/04/2016  . OSA on CPAP 07/06/2015  . Depression (emotion) 07/06/2015  . Abnormal barium swallow   . Abdominal pain, epigastric 08/15/2013  . Benign neoplasm of colon 08/15/2013  . Special screening for malignant neoplasms, colon 08/15/2013  . Hepatic steatosis 05/19/2013  .  History of pancreatitis 05/19/2013  . Family history of colon cancer 05/19/2013  . GERD (gastroesophageal reflux disease) 02/18/2013  . Smoking 02/18/2013  . Morbid obesity ([place]) 05/28/2012  . Type 2 diabetes mellitus ([place]) 05/28/2012  . Osteoarthritis of left and right knee 05/28/2012  . Generalized anxiety disorder 05/28/2012    Past Surgical History:  Procedure Laterality Date  . CHOLECYSTECTOMY    . COLONOSCOPY WITH PROPOFOL N/A 08/15/2013   Procedure: COLONOSCOPY WITH PROPOFOL;  Surgeon: Jerene Bears, MD;  Location: [facility] ENDOSCOPY;  Service: Gastroenterology;  Laterality: N/A;  . ESOPHAGOGASTRODUODENOSCOPY N/A 05/12/2014   Procedure: ESOPHAGOGASTRODUODENOSCOPY (EGD);  Surgeon: Jerene Bears, MD;  Location: [facility];  Service: Gastroenterology;  Laterality: N/A;  . ESOPHAGOGASTRODUODENOSCOPY (EGD) WITH PROPOFOL N/A 08/15/2013   Procedure: ESOPHAGOGASTRODUODENOSCOPY (EGD) WITH PROPOFOL;  Surgeon: Jerene Bears, MD;  Location: [facility] ENDOSCOPY;  Service: Gastroenterology;  Laterality: N/A;  . TUBAL LIGATION Bilateral      OB History    Gravida  3   Para  3   Term  2   Preterm  1   AB      Living  4     SAB      TAB      Ectopic      Multiple  1   Live Births  Home Medications    Prior to Admission medications   Medication Sig Start Date End Date Taking? Authorizing Provider  ACCU-CHEK FASTCLIX LANCETS MISC Use as directed 12/22/15   Lottie Mussel T, MD  albuterol (PROVENTIL HFA;VENTOLIN HFA) 108 (90 Base) MCG/ACT inhaler Inhale 1-2 puffs into the lungs every 6 (six) hours as needed for wheezing or shortness of breath. 04/17/68   Delora Fuel, MD  aspirin EC 81 MG tablet Take 1 tablet (81 mg total) by mouth daily. 04/25/16   Langeland, Leda Quail, MD  benzonatate (TESSALON) 100 MG capsule Take 1 capsule (100 mg total) by mouth every 8 (eight) hours. 05/28/17   Caretha Rumbaugh, Dellis Filbert, PA-C  Blood Glucose Monitoring Suppl (ACCU-CHEK AVIVA PLUS) w/Device KIT 1 each by  Does not apply route 3 (three) times daily. 08/11/16   Argentina Donovan, PA-C  buPROPion (WELLBUTRIN XL) 150 MG 24 hr tablet Take 3 tablets (450 mg total) daily by mouth. 11/23/16   Charlott Rakes, MD  clonazePAM (KLONOPIN) 1 MG tablet Take 1 mg by mouth at bedtime.    [provider]  fluconazole (DIFLUCAN) 150 MG tablet Take 1 tablet (150 mg total) by mouth daily for 1 day. 05/28/17 05/29/17  Adalina Dopson, Dellis Filbert, PA-C  gabapentin (NEURONTIN) 300 MG capsule Take 1 capsule (300 mg total) by mouth 2 (two) times daily. 03/12/17   Charlott Rakes, MD  glucose blood (ACCU-CHEK AVIVA PLUS) test strip TEST three times a day. E11.9 10/24/16   Charlott Rakes, MD  insulin aspart (NOVOLOG) 100 UNIT/ML FlexPen Inject 0-12 Units into the skin 3 (three) times daily with meals. As per sliding scale 09/15/16   Charlott Rakes, MD  Insulin Glargine (LANTUS SOLOSTAR) 100 UNIT/ML Solostar Pen Inject 70 Units into the skin daily at 10 pm. 02/16/17   Charlott Rakes, MD  Insulin Pen Needle 31G X 8 MM MISC Use as directed for 4 times daily insulin administration. 11/07/16   Tresa Garter, MD  Insulin Syringe-Needle U-100 (BD INSULIN SYRINGE ULTRAFINE) 31G X 15/64" 0.5 ML MISC Use as directed 08/11/15   Tresa Garter, MD  INVOKANA 300 MG TABS tablet Take 1 tablet (300 mg total) by mouth daily. 03/08/17   Charlott Rakes, MD  ipratropium (ATROVENT) 0.03 % nasal spray Place 2 sprays into both nostrils 2 (two) times daily. 03/27/17   Robyn Haber, MD  ketoconazole (NIZORAL) 2 % cream Apply 1 application topically 2 (two) times daily. 03/27/17   Robyn Haber, MD  lisinopril (PRINIVIL,ZESTRIL) 5 MG tablet Take 1 tablet (5 mg total) daily by mouth. 11/22/16   Charlott Rakes, MD  loratadine (CLARITIN) 10 MG tablet Take 1 tablet (10 mg total) by mouth daily. 05/28/17   Alayah Knouff, Dellis Filbert, PA-C  metFORMIN (GLUCOPHAGE-XR) 500 MG 24 hr tablet Take 2 tablets (1,000 mg total) by mouth 2 (two) times daily. 04/13/17   Charlott Rakes, MD  methocarbamol (ROBAXIN) 500 MG tablet Take 2 tablets (1,000 mg total) by mouth every 12 (twelve) hours as needed for muscle spasms. 03/12/17   Charlott Rakes, MD  metoCLOPramide (REGLAN) 5 MG tablet TAKE ONE TABLET BY MOUTH EVERY DAY 02/21/17   Pyrtle, Lajuan Lines, MD  NEEDLE, DISP, 24 G (B-D DISP NEEDLE 24GX1") 24G X 1" MISC Use with solostar pen to inject 4 times daily 07/09/14   Tresa Garter, MD  pantoprazole (PROTONIX) 40 MG tablet Take 1 tablet (40 mg total) daily by mouth. 11/22/16   Charlott Rakes, MD  pravastatin (PRAVACHOL) 20 MG tablet Take 1 tablet (20  mg total) by mouth daily. 04/13/17   Charlott Rakes, MD  predniSONE (DELTASONE) 20 MG tablet one daily with food 03/27/17   Robyn Haber, MD  traMADol (ULTRAM) 50 MG tablet Take 1 tablet (50 mg total) by mouth every 12 (twelve) hours as needed. 04/13/17   Charlott Rakes, MD  triamcinolone cream (KENALOG) 0.1 % Apply 1 application topically 2 (two) times daily. 01/18/17   Argentina Donovan, PA-C  Vitamin D, Ergocalciferol, (DRISDOL) 50000 units CAPS capsule Take 1 capsule (50,000 Units total) by mouth every 7 (seven) days. 01/23/17   Argentina Donovan, PA-C  zolpidem (AMBIEN) 5 MG tablet Take 5 mg by mouth at bedtime.    [provider]    Family History Family History  Problem Relation Age of Onset  . Diabetes Mother   . Stroke Mother   . Hypertension Mother   . Breast cancer Sister   . Heart attack Sister   . Other Daughter        died at age 28  . Cirrhosis Father   . Colon cancer Maternal Uncle     Social History Social History   Tobacco Use  . Smoking status: Former Smoker    Packs/day: 0.25    Types: Cigarettes    Last attempt to quit: 08/01/2016    Years since quitting: 0.8  . Smokeless tobacco: Never Used  Substance Use Topics  . Alcohol use: No    Alcohol/week: 0.0 oz  . Drug use: No     Allergies   Patient has no known allergies.   Review of Systems Review of Systems  All other  systems reviewed and are negative.   Physical Exam Updated Vital Signs BP 129/86 (BP Location: Right Arm)   Pulse 74   Temp 98.2 F (36.8 C) (Oral)   Resp 16   LMP 01/23/2014 (Approximate) Comment: no cycle since this date  SpO2 97%   Physical Exam  Constitutional: She is oriented to person, place, and time. She appears well-developed and well-nourished.  HENT:  Head: Normocephalic and atraumatic.  Eyes: Pupils are equal, round, and reactive to light. Conjunctivae are normal. Right eye exhibits no discharge. Left eye exhibits no discharge. No scleral icterus.  Neck: Normal range of motion. No JVD present. No tracheal deviation present.  Pulmonary/Chest: Effort normal. No stridor.  Abdominal: Soft. She exhibits no distension. There is no tenderness.  Genitourinary:  Genitourinary Comments: Excoriation noted on the labia and surrounding soft tissues, no vesicles, no significant discharge, no cervical motion tenderness  Neurological: She is alert and oriented to person, place, and time. Coordination normal.  Psychiatric: She has a normal mood and affect. Her behavior is normal. Judgment and thought content normal.  Nursing note and vitals reviewed.    ED Treatments / Results  Labs (all labs ordered are listed, but only abnormal results are displayed) Labs Reviewed  WET PREP, GENITAL - Abnormal; Notable for the following components:      Result Value   WBC, Wet Prep HPF POC FEW (*)    All other components within normal limits  URINALYSIS, ROUTINE W REFLEX MICROSCOPIC - Abnormal; Notable for the following components:   Glucose, UA >=500 (*)    Hgb urine dipstick MODERATE (*)    Ketones, ur 5 (*)    All other components within normal limits  POC URINE PREG, ED  GC/CHLAMYDIA PROBE AMP ([place]) NOT AT [facility]    EKG None  Radiology Dg Chest 2 View  Result Date:  05/28/2017 CLINICAL DATA:  Cough for 2 months EXAM: CHEST - 2 VIEW COMPARISON:  October 05, 2016 FINDINGS:  Lungs are clear. The heart size and pulmonary vascularity are normal. No adenopathy. No bone lesions. IMPRESSION: No edema or consolidation. Electronically Signed   By: Lowella Grip III M.D.   On: 05/28/2017 09:28    Procedures Procedures (including critical care time)  Medications Ordered in ED Medications  fluconazole (DIFLUCAN) tablet 150 mg (150 mg Oral Given 05/28/17 1331)     Initial Impression / Assessment and Plan / ED Course  I have reviewed the triage vital signs and the nursing notes.  Pertinent labs & imaging results that were available during my care of the patient were reviewed by me and considered in my medical decision making (see chart for details).    52 year old female presents today with several complaints.  Patient likely with yeast vaginitis.  She will be treated with fluconazole.  Patient has no signs of purulent infection, no signs of pelvic inflammatory disease.  Patient also with dry nonproductive cough likely secondary to allergic rhinitis.  She will be treated with Flonase, Tessalon, and Claritin.  Patient is a known diabetic poorly controlled, had the lengthy discussion with her about diabetic management including using insulin, exercise, diet.  She will follow-up as a outpatient with her primary care she will return immediately with any new or worsening signs or symptoms.  She verbalized understanding and agreement to today's plan had no further questions or concerns at the time discharge.  Final Clinical Impressions(s) / ED Diagnoses   Final diagnoses:  Allergic rhinitis, unspecified seasonality, unspecified trigger  Yeast vaginitis    ED Discharge Orders        Ordered    fluconazole (DIFLUCAN) 150 MG tablet  Daily     05/28/17 1315    benzonatate (TESSALON) 100 MG capsule  Every 8 hours     05/28/17 1315    loratadine (CLARITIN) 10 MG tablet  Daily     05/28/17 1315       Okey Regal, PA-C 05/28/17 1732    Duffy Bruce,  MD 05/29/17 1207

## 2017-05-28 NOTE — ED Triage Notes (Signed)
Pt reports vaginal itch and burning x1 week, also reports productive cough x2 weeks. No sob or chest pain, pt a/ox4, resp e/u, nad.

## 2017-05-29 LAB — GC/CHLAMYDIA PROBE AMP (~~LOC~~) NOT AT ARMC
Chlamydia: NEGATIVE
Neisseria Gonorrhea: NEGATIVE

## 2017-06-05 ENCOUNTER — Other Ambulatory Visit: Payer: Self-pay | Admitting: Family Medicine

## 2017-06-05 DIAGNOSIS — E1149 Type 2 diabetes mellitus with other diabetic neurological complication: Secondary | ICD-10-CM

## 2017-06-07 ENCOUNTER — Other Ambulatory Visit: Payer: Self-pay | Admitting: Family Medicine

## 2017-06-07 ENCOUNTER — Other Ambulatory Visit: Payer: Self-pay | Admitting: Internal Medicine

## 2017-06-07 DIAGNOSIS — K3184 Gastroparesis: Principal | ICD-10-CM

## 2017-06-07 DIAGNOSIS — E1143 Type 2 diabetes mellitus with diabetic autonomic (poly)neuropathy: Secondary | ICD-10-CM

## 2017-06-07 DIAGNOSIS — B3789 Other sites of candidiasis: Secondary | ICD-10-CM

## 2017-06-12 ENCOUNTER — Other Ambulatory Visit: Payer: Self-pay | Admitting: Family Medicine

## 2017-06-12 DIAGNOSIS — E1169 Type 2 diabetes mellitus with other specified complication: Secondary | ICD-10-CM

## 2017-06-12 DIAGNOSIS — Z794 Long term (current) use of insulin: Principal | ICD-10-CM

## 2017-06-12 NOTE — Telephone Encounter (Signed)
Refilled. Patient was last seen 04/09/17

## 2017-06-13 ENCOUNTER — Ambulatory Visit: Payer: Medicaid Other | Admitting: Family Medicine

## 2017-06-19 ENCOUNTER — Ambulatory Visit: Payer: Medicaid Other | Attending: Family Medicine | Admitting: Family Medicine

## 2017-06-19 ENCOUNTER — Encounter: Payer: Self-pay | Admitting: Family Medicine

## 2017-06-19 VITALS — BP 130/80 | HR 107 | Temp 97.9°F | Ht 66.0 in | Wt 317.6 lb

## 2017-06-19 DIAGNOSIS — J45909 Unspecified asthma, uncomplicated: Secondary | ICD-10-CM | POA: Diagnosis not present

## 2017-06-19 DIAGNOSIS — G8929 Other chronic pain: Secondary | ICD-10-CM

## 2017-06-19 DIAGNOSIS — E559 Vitamin D deficiency, unspecified: Secondary | ICD-10-CM | POA: Diagnosis not present

## 2017-06-19 DIAGNOSIS — M5416 Radiculopathy, lumbar region: Secondary | ICD-10-CM | POA: Diagnosis not present

## 2017-06-19 DIAGNOSIS — E1143 Type 2 diabetes mellitus with diabetic autonomic (poly)neuropathy: Secondary | ICD-10-CM | POA: Insufficient documentation

## 2017-06-19 DIAGNOSIS — Z8619 Personal history of other infectious and parasitic diseases: Secondary | ICD-10-CM | POA: Insufficient documentation

## 2017-06-19 DIAGNOSIS — Z9049 Acquired absence of other specified parts of digestive tract: Secondary | ICD-10-CM | POA: Insufficient documentation

## 2017-06-19 DIAGNOSIS — Z7982 Long term (current) use of aspirin: Secondary | ICD-10-CM | POA: Diagnosis not present

## 2017-06-19 DIAGNOSIS — M1712 Unilateral primary osteoarthritis, left knee: Secondary | ICD-10-CM | POA: Insufficient documentation

## 2017-06-19 DIAGNOSIS — B3731 Acute candidiasis of vulva and vagina: Secondary | ICD-10-CM

## 2017-06-19 DIAGNOSIS — B373 Candidiasis of vulva and vagina: Secondary | ICD-10-CM | POA: Diagnosis not present

## 2017-06-19 DIAGNOSIS — E1165 Type 2 diabetes mellitus with hyperglycemia: Secondary | ICD-10-CM | POA: Diagnosis not present

## 2017-06-19 DIAGNOSIS — F411 Generalized anxiety disorder: Secondary | ICD-10-CM | POA: Diagnosis not present

## 2017-06-19 DIAGNOSIS — Z794 Long term (current) use of insulin: Secondary | ICD-10-CM | POA: Insufficient documentation

## 2017-06-19 DIAGNOSIS — Z9889 Other specified postprocedural states: Secondary | ICD-10-CM | POA: Diagnosis not present

## 2017-06-19 DIAGNOSIS — K219 Gastro-esophageal reflux disease without esophagitis: Secondary | ICD-10-CM | POA: Diagnosis not present

## 2017-06-19 DIAGNOSIS — E669 Obesity, unspecified: Secondary | ICD-10-CM | POA: Diagnosis not present

## 2017-06-19 DIAGNOSIS — M5441 Lumbago with sciatica, right side: Secondary | ICD-10-CM

## 2017-06-19 DIAGNOSIS — B9689 Other specified bacterial agents as the cause of diseases classified elsewhere: Secondary | ICD-10-CM | POA: Insufficient documentation

## 2017-06-19 DIAGNOSIS — K3184 Gastroparesis: Secondary | ICD-10-CM | POA: Insufficient documentation

## 2017-06-19 DIAGNOSIS — Z79899 Other long term (current) drug therapy: Secondary | ICD-10-CM | POA: Insufficient documentation

## 2017-06-19 DIAGNOSIS — E1169 Type 2 diabetes mellitus with other specified complication: Secondary | ICD-10-CM | POA: Insufficient documentation

## 2017-06-19 DIAGNOSIS — Z9851 Tubal ligation status: Secondary | ICD-10-CM | POA: Insufficient documentation

## 2017-06-19 DIAGNOSIS — Z6841 Body Mass Index (BMI) 40.0 and over, adult: Secondary | ICD-10-CM | POA: Diagnosis not present

## 2017-06-19 DIAGNOSIS — G4730 Sleep apnea, unspecified: Secondary | ICD-10-CM | POA: Insufficient documentation

## 2017-06-19 DIAGNOSIS — J3089 Other allergic rhinitis: Secondary | ICD-10-CM

## 2017-06-19 DIAGNOSIS — Z87828 Personal history of other (healed) physical injury and trauma: Secondary | ICD-10-CM | POA: Diagnosis not present

## 2017-06-19 DIAGNOSIS — I10 Essential (primary) hypertension: Secondary | ICD-10-CM | POA: Diagnosis not present

## 2017-06-19 LAB — GLUCOSE, POCT (MANUAL RESULT ENTRY)
POC GLUCOSE: 351 mg/dL — AB (ref 70–99)
POC Glucose: 316 mg/dl — AB (ref 70–99)

## 2017-06-19 LAB — POCT GLYCOSYLATED HEMOGLOBIN (HGB A1C): HBA1C, POC (CONTROLLED DIABETIC RANGE): 13.4 % — AB (ref 0.0–7.0)

## 2017-06-19 MED ORDER — METFORMIN HCL ER 500 MG PO TB24: 1000.0000 mg | ORAL_TABLET | Freq: Two times a day (BID) | ORAL | 2 refills | Status: DC

## 2017-06-19 MED ORDER — BENZONATATE 100 MG PO CAPS: 100.0000 mg | ORAL_CAPSULE | Freq: Three times a day (TID) | ORAL | 0 refills | Status: DC

## 2017-06-19 MED ORDER — PANTOPRAZOLE SODIUM 40 MG PO TBEC
40.0000 mg | DELAYED_RELEASE_TABLET | Freq: Every day | ORAL | 6 refills | Status: DC
Start: 2017-06-19 — End: 2018-02-06

## 2017-06-19 MED ORDER — ATORVASTATIN CALCIUM 20 MG PO TABS: 20.0000 mg | ORAL_TABLET | Freq: Every day | ORAL | 1 refills | Status: DC

## 2017-06-19 MED ORDER — INSULIN ASPART 100 UNIT/ML ~~LOC~~ SOLN
8.0000 [IU] | Freq: Once | SUBCUTANEOUS | Status: AC
  Administered 2017-06-19: 8 [IU] via SUBCUTANEOUS

## 2017-06-19 MED ORDER — INSULIN ASPART 100 UNIT/ML FLEXPEN: 0.0000 [IU] | PEN_INJECTOR | Freq: Three times a day (TID) | SUBCUTANEOUS | 6 refills | Status: DC

## 2017-06-19 MED ORDER — INSULIN GLARGINE 100 UNIT/ML SOLOSTAR PEN: 40.0000 [IU] | PEN_INJECTOR | Freq: Two times a day (BID) | SUBCUTANEOUS | 6 refills | Status: DC

## 2017-06-19 MED ORDER — LISINOPRIL 5 MG PO TABS: 5.0000 mg | ORAL_TABLET | Freq: Every day | ORAL | 1 refills | Status: DC

## 2017-06-19 MED ORDER — CETIRIZINE HCL 10 MG PO TABS: 10.0000 mg | ORAL_TABLET | Freq: Every day | ORAL | 1 refills | Status: DC

## 2017-06-19 MED ORDER — METHOCARBAMOL 500 MG PO TABS: 1000.0000 mg | ORAL_TABLET | Freq: Two times a day (BID) | ORAL | 3 refills | Status: DC | PRN

## 2017-06-19 MED ORDER — FLUCONAZOLE 150 MG PO TABS: 150.0000 mg | ORAL_TABLET | Freq: Once | ORAL | 0 refills | Status: AC

## 2017-06-19 MED ORDER — LIRAGLUTIDE 18 MG/3ML ~~LOC~~ SOPN: PEN_INJECTOR | SUBCUTANEOUS | 6 refills | Status: DC

## 2017-06-19 MED ORDER — TRAMADOL HCL 50 MG PO TABS: 50.0000 mg | ORAL_TABLET | Freq: Two times a day (BID) | ORAL | 1 refills | Status: DC | PRN

## 2017-06-19 NOTE — Progress Notes (Signed)
Subjective:  Patient ID: Tara Keller, female    DOB: 11-02-1965  Age: 52 y.o. MRN: [id number]  CC: Diabetes and Sinusitis   HPI Tara Keller is a 52 year old female with a history of uncontrolled type 2 diabetes mellitus (A1c 13.4 ), diabetic neuropathy, GERD, gastroparesis, depression and anxiety who presents today for follow up of right sided sciatica here for follow-up visit. She was seen by orthopedics, Dr. Marlou Sa and also by rehab medicine and had PT sessions with no improvement in her symptoms.  Pain is worse with prolonged sitting and standing and radiates down both lower extremities and she would like to be referred to pain management but meanwhile is requesting a refill of tramadol.  Referred for epidural spinal injections which she never received because she did not like the Doctor.  She also has left knee osteoarthritis and complains of severe pain. Anxiety and depression is managed by her psychiatrist was prescribed Wellbutrin and clonazepam as she complains she is sometimes has panic attacks. Her A1c is 13.4 which has trended up from 8.7 previously and she endorses compliance with her medications but not with a diabetic diet.  Her sugars at home range anywhere from 200 to the 400s.  She denies visual concerns. Also complains of vaginal itching and burning but no discharge; she also has runny nose, itchy eyes, cough which her symptoms of her allergies and she has run out of her medications.  Past Medical History:  Diagnosis Date  . Anxiety   . Arthritis   . Asthma   . Depression   . Diabetes mellitus   . Fatty liver   . Fracture closed of upper end of forearm 9 years, Fx left left leg  . Fracture of left lower leg @ 52 years old  . H/O tubal ligation   . Hiatal hernia   . Hypertension   . Obesity   . Positive H. pylori test   . Sleep apnea    uses CPAP  . Tubular adenoma of colon     Past Surgical History:  Procedure Laterality Date  . CHOLECYSTECTOMY    .  COLONOSCOPY WITH PROPOFOL N/A 08/15/2013   Procedure: COLONOSCOPY WITH PROPOFOL;  Surgeon: Jerene Bears, MD;  Location: [facility];  Service: Gastroenterology;  Laterality: N/A;  . ESOPHAGOGASTRODUODENOSCOPY N/A 05/12/2014   Procedure: ESOPHAGOGASTRODUODENOSCOPY (EGD);  Surgeon: Jerene Bears, MD;  Location: [facility];  Service: Gastroenterology;  Laterality: N/A;  . ESOPHAGOGASTRODUODENOSCOPY (EGD) WITH PROPOFOL N/A 08/15/2013   Procedure: ESOPHAGOGASTRODUODENOSCOPY (EGD) WITH PROPOFOL;  Surgeon: Jerene Bears, MD;  Location: [facility];  Service: Gastroenterology;  Laterality: N/A;  . TUBAL LIGATION Bilateral      Outpatient Medications Prior to Visit  Medication Sig Dispense Refill  . ACCU-CHEK FASTCLIX LANCETS MISC Use as directed 100 each 12  . albuterol (PROVENTIL HFA;VENTOLIN HFA) 108 (90 Base) MCG/ACT inhaler Inhale 1-2 puffs into the lungs every 6 (six) hours as needed for wheezing or shortness of breath. 1 Inhaler 0  . aspirin EC 81 MG tablet Take 1 tablet (81 mg total) by mouth daily. 90 tablet 3  . Blood Glucose Monitoring Suppl (ACCU-CHEK AVIVA PLUS) w/Device KIT 1 each by Does not apply route 3 (three) times daily. 1 kit 0  . buPROPion (WELLBUTRIN XL) 150 MG 24 hr tablet Take 3 tablets (450 mg total) daily by mouth. 30 tablet 0  . clonazePAM (KLONOPIN) 1 MG tablet Take 1 mg by mouth at bedtime.    [name] glucose blood (  ACCU-CHEK AVIVA PLUS) test strip TEST three times a day. E11.9 100 each 12  . Insulin Pen Needle 31G X 8 MM MISC Use as directed for 4 times daily insulin administration. 100 each 12  . Insulin Syringe-Needle U-100 (BD INSULIN SYRINGE ULTRAFINE) 31G X 15/64" 0.5 ML MISC Use as directed 100 each 5  . ipratropium (ATROVENT) 0.03 % nasal spray Place 2 sprays into both nostrils 2 (two) times daily. 30 mL 0  . ketoconazole (NIZORAL) 2 % cream Apply 1 application topically 2 (two) times daily. 60 g 0  . metoCLOPramide (REGLAN) 5 MG tablet TAKE ONE TABLET BY MOUTH EVERY DAY 30  tablet 2  . NEEDLE, DISP, 24 G (B-D DISP NEEDLE 24GX1") 24G X 1" MISC Use with solostar pen to inject 4 times daily 200 each 1  . NYAMYC powder APPLY TOPICALLY TO THE AFFECTED AREA(S) TWICE DAILY 45 g 1  . triamcinolone cream (KENALOG) 0.1 % Apply 1 application topically 2 (two) times daily. 45 g 1  . zolpidem (AMBIEN) 5 MG tablet Take 5 mg by mouth at bedtime.    . insulin aspart (NOVOLOG) 100 UNIT/ML FlexPen Inject 0-12 Units into the skin 3 (three) times daily with meals. As per sliding scale 15 mL 6  . INVOKANA 300 MG TABS tablet Take 1 tablet (300 mg total) by mouth daily. 90 tablet 1  . LANTUS SOLOSTAR 100 UNIT/ML Solostar Pen INJECT 70 UNITS SUBCUTANEOUSLY DAILY AT 10 IN THE EVENING 15 mL 3  . lisinopril (PRINIVIL,ZESTRIL) 5 MG tablet Take 1 tablet (5 mg total) daily by mouth. 90 tablet 1  . loratadine (CLARITIN) 10 MG tablet Take 1 tablet (10 mg total) by mouth daily. 30 tablet 0  . metFORMIN (GLUCOPHAGE-XR) 500 MG 24 hr tablet Take 2 tablets (1,000 mg total) by mouth 2 (two) times daily. 120 tablet 2  . methocarbamol (ROBAXIN) 500 MG tablet Take 2 tablets (1,000 mg total) by mouth every 12 (twelve) hours as needed for muscle spasms. 120 tablet 3  . pantoprazole (PROTONIX) 40 MG tablet Take 1 tablet (40 mg total) daily by mouth. 30 tablet 6  . pravastatin (PRAVACHOL) 20 MG tablet Take 1 tablet (20 mg total) by mouth daily. 90 tablet 0  . predniSONE (DELTASONE) 20 MG tablet one daily with food 5 tablet 0  . traMADol (ULTRAM) 50 MG tablet Take 1 tablet (50 mg total) by mouth every 12 (twelve) hours as needed. 60 tablet 1  . gabapentin (NEURONTIN) 300 MG capsule TAKE ONE CAPSULE BY MOUTH TWICE DAILY (Patient not taking: Reported on 06/19/2017) 60 capsule 2  . Vitamin D, Ergocalciferol, (DRISDOL) 50000 units CAPS capsule Take 1 capsule (50,000 Units total) by mouth every 7 (seven) days. (Patient not taking: Reported on 06/19/2017) 16 capsule 0  . benzonatate (TESSALON) 100 MG capsule Take 1  capsule (100 mg total) by mouth every 8 (eight) hours. (Patient not taking: Reported on 06/19/2017) 21 capsule 0   No facility-administered medications prior to visit.     ROS Review of Systems  Constitutional: Negative for activity change, appetite change and fatigue.  HENT: Positive for rhinorrhea. Negative for congestion, sinus pressure and sore throat.   Eyes: Positive for itching. Negative for visual disturbance.  Respiratory: Positive for cough. Negative for chest tightness, shortness of breath and wheezing.   Cardiovascular: Negative for chest pain and palpitations.  Gastrointestinal: Negative for abdominal distention, abdominal pain and constipation.  Endocrine: Negative for polydipsia.  Genitourinary: Negative for dysuria and frequency.  Musculoskeletal:  See hpi  Skin: Negative for rash.  Neurological: Negative for tremors, light-headedness and numbness.  Hematological: Does not bruise/bleed easily.  Psychiatric/Behavioral: Negative for agitation and behavioral problems.    Objective:  BP 130/80   Pulse (!) 107   Temp 97.9 F (36.6 C) (Oral)   Ht 5' 6"  (1.676 m)   Wt (!) 317 lb 9.6 oz (144.1 kg)   LMP 01/23/2014 (Approximate) Comment: no cycle since this date  SpO2 98%   BMI 51.26 kg/m   BP/Weight 06/19/2017 06/28/3557 07/12/1636  Systolic BP 453 646 803  Diastolic BP 80 86 77  Wt. (Lbs) 317.6 - 327  BMI 51.26 - 52.78      Physical Exam  Constitutional: She is oriented to person, place, and time. She appears well-developed and well-nourished.  Morbidly obese  Cardiovascular: Normal rate, normal heart sounds and intact distal pulses.  No murmur heard. Pulmonary/Chest: Effort normal and breath sounds normal. She has no wheezes. She has no rales. She exhibits no tenderness.  Abdominal: Soft. Bowel sounds are normal. She exhibits no distension and no mass. There is no tenderness.  Musculoskeletal: She exhibits tenderness (TTp across lumbar region; positive  straight leg raise b/l).  Neurological: She is alert and oriented to person, place, and time.  Skin: Skin is warm and dry.  Psychiatric: She has a normal mood and affect.    Lab Results  Component Value Date   HGBA1C 13.4 (A) 06/19/2017    Assessment & Plan:   1. Type 2 diabetes mellitus with hyperglycemia, with long-term current use of insulin (HCC) Uncontrolled with A1c of 13.4 has trended up from 8.7 previously Increased dose of Lantus, commenced Victoza which will help with weight Discontinue Invokana. Scheduled to meet with clinical pharmacist for medication reconciliation and ensure her understanding of uptitrating Victoza Refer to nutrition \Counseled on Diabetic diet, my plate method, 212 minutes of moderate intensity exercise/week Keep blood sugar logs with fasting goals of 80-120 mg/dl, random of less than 180 and in the event of sugars less than 60 mg/dl or greater than 400 mg/dl please notify the clinic ASAP. It is recommended that you undergo annual eye exams and annual foot exams. Pneumonia vaccine is recommended. - POCT glucose (manual entry) - POCT glycosylated hemoglobin (Hb A1C) - Amb ref to Medical Nutrition Therapy-MNT - insulin aspart (novoLOG) injection 8 Units - POCT glucose (manual entry)  2. Type 2 diabetes mellitus with other specified complication, with long-term current use of insulin (HCC) - Insulin Glargine (LANTUS SOLOSTAR) 100 UNIT/ML Solostar Pen; Inject 40 Units into the skin 2 (two) times daily.  Dispense: 30 mL; Refill: 6 - liraglutide (VICTOZA) 18 MG/3ML SOPN; Inject subcutaneously daily 0.6 mg for 1 week then 1.2 mg for 1 week then 1.8 mg thereafter  Dispense: 30 mL; Refill: 6 - insulin aspart (NOVOLOG) 100 UNIT/ML FlexPen; Inject 0-12 Units into the skin 3 (three) times daily with meals. As per sliding scale  Dispense: 15 mL; Refill: 6 - lisinopril (PRINIVIL,ZESTRIL) 5 MG tablet; Take 1 tablet (5 mg total) by mouth daily.  Dispense: 90 tablet;  Refill: 1 - metFORMIN (GLUCOPHAGE-XR) 500 MG 24 hr tablet; Take 2 tablets (1,000 mg total) by mouth 2 (two) times daily.  Dispense: 120 tablet; Refill: 2 - atorvastatin (LIPITOR) 20 MG tablet; Take 1 tablet (20 mg total) by mouth daily.  Dispense: 90 tablet; Refill: 1  3. Generalized anxiety disorder Currently managed by psychiatry where she receives Wellbutrin and clonazepam  4. Primary osteoarthritis of left  knee Uncontrolled, will refer to pain clinic - traMADol (ULTRAM) 50 MG tablet; Take 1 tablet (50 mg total) by mouth every 12 (twelve) hours as needed.  Dispense: 60 tablet; Refill: 1 - Ambulatory referral to Pain Clinic  5. Lumbar radiculopathy Seen by orthopedics and rehab with no improvement after PT sessions Will refer to pain management Reviewed [facility] controlled substances reporting system and she is not compliant Will not sign pain contract as I am referring her to the pain clinic. - traMADol (ULTRAM) 50 MG tablet; Take 1 tablet (50 mg total) by mouth every 12 (twelve) hours as needed.  Dispense: 60 tablet; Refill: 1 - Ambulatory referral to Pain Clinic  6. Vitamin D deficiency - VITAMIN D 25 Hydroxy (Vit-D Deficiency, Fractures)  7. Vaginal candidiasis - fluconazole (DIFLUCAN) 150 MG tablet; Take 1 tablet (150 mg total) by mouth once for 1 dose.  Dispense: 1 tablet; Refill: 0  8. Chronic midline low back pain with right-sided sciatica - methocarbamol (ROBAXIN) 500 MG tablet; Take 2 tablets (1,000 mg total) by mouth every 12 (twelve) hours as needed for muscle spasms.  Dispense: 120 tablet; Refill: 3  9. Gastroesophageal reflux disease without esophagitis Controlled - pantoprazole (PROTONIX) 40 MG tablet; Take 1 tablet (40 mg total) by mouth daily.  Dispense: 30 tablet; Refill: 6  10. Allergic rhinitis due to other allergic trigger, unspecified seasonality Uncontrolled Refilled Zyrtec - cetirizine (ZYRTEC) 10 MG tablet; Take 1 tablet (10 mg total) by mouth  daily.  Dispense: 30 tablet; Refill: 1   Meds ordered this encounter  Medications  . cetirizine (ZYRTEC) 10 MG tablet    Sig: Take 1 tablet (10 mg total) by mouth daily.    Dispense:  30 tablet    Refill:  1  . traMADol (ULTRAM) 50 MG tablet    Sig: Take 1 tablet (50 mg total) by mouth every 12 (twelve) hours as needed.    Dispense:  60 tablet    Refill:  1  . Insulin Glargine (LANTUS SOLOSTAR) 100 UNIT/ML Solostar Pen    Sig: Inject 40 Units into the skin 2 (two) times daily.    Dispense:  30 mL    Refill:  6    This prescription was filled on 06/12/2017. Any refills authorized will be placed on file.  . fluconazole (DIFLUCAN) 150 MG tablet    Sig: Take 1 tablet (150 mg total) by mouth once for 1 dose.    Dispense:  1 tablet    Refill:  0  . liraglutide (VICTOZA) 18 MG/3ML SOPN    Sig: Inject subcutaneously daily 0.6 mg for 1 week then 1.2 mg for 1 week then 1.8 mg thereafter    Dispense:  30 mL    Refill:  6    Discontinue Invokana  . DISCONTD: benzonatate (TESSALON) 100 MG capsule    Sig: Take 1 capsule (100 mg total) by mouth every 8 (eight) hours.    Dispense:  21 capsule    Refill:  0  . insulin aspart (NOVOLOG) 100 UNIT/ML FlexPen    Sig: Inject 0-12 Units into the skin 3 (three) times daily with meals. As per sliding scale    Dispense:  15 mL    Refill:  6  . lisinopril (PRINIVIL,ZESTRIL) 5 MG tablet    Sig: Take 1 tablet (5 mg total) by mouth daily.    Dispense:  90 tablet    Refill:  1  . metFORMIN (GLUCOPHAGE-XR) 500 MG 24 hr tablet  Sig: Take 2 tablets (1,000 mg total) by mouth 2 (two) times daily.    Dispense:  120 tablet    Refill:  2  . methocarbamol (ROBAXIN) 500 MG tablet    Sig: Take 2 tablets (1,000 mg total) by mouth every 12 (twelve) hours as needed for muscle spasms.    Dispense:  120 tablet    Refill:  3  . pantoprazole (PROTONIX) 40 MG tablet    Sig: Take 1 tablet (40 mg total) by mouth daily.    Dispense:  30 tablet    Refill:  6  .  atorvastatin (LIPITOR) 20 MG tablet    Sig: Take 1 tablet (20 mg total) by mouth daily.    Dispense:  90 tablet    Refill:  1  . insulin aspart (novoLOG) injection 8 Units  . benzonatate (TESSALON) 100 MG capsule    Sig: Take 1 capsule (100 mg total) by mouth every 8 (eight) hours.    Dispense:  21 capsule    Refill:  0    Follow-up: Return in about 2 weeks (around 07/03/2017) for Medication reconciliation with Lurena Joiner; 51-monthfollow-up with PCP.   ECharlott RakesMD

## 2017-06-19 NOTE — Progress Notes (Signed)
C/C: yeast infection, back pain.

## 2017-06-20 LAB — VITAMIN D 25 HYDROXY (VIT D DEFICIENCY, FRACTURES): VIT D 25 HYDROXY: 23.8 ng/mL — AB (ref 30.0–100.0)

## 2017-06-21 ENCOUNTER — Other Ambulatory Visit: Payer: Self-pay | Admitting: Family Medicine

## 2017-06-21 ENCOUNTER — Telehealth: Payer: Self-pay | Admitting: Family Medicine

## 2017-06-21 MED ORDER — VITAMIN D (ERGOCALCIFEROL) 1.25 MG (50000 UNIT) PO CAPS: 50000.0000 [IU] | ORAL_CAPSULE | ORAL | 0 refills | Status: DC

## 2017-06-21 NOTE — Telephone Encounter (Signed)
Patient called for medication to be authorization .

## 2017-06-22 ENCOUNTER — Telehealth: Payer: Self-pay

## 2017-06-22 NOTE — Telephone Encounter (Signed)
Noted. Prior Tara Keller will be started today.

## 2017-06-22 NOTE — Telephone Encounter (Signed)
Patient was called and informed of lab results. 

## 2017-07-03 ENCOUNTER — Encounter: Payer: Self-pay | Admitting: Pharmacist

## 2017-07-03 ENCOUNTER — Ambulatory Visit: Payer: Medicaid Other | Attending: Family Medicine | Admitting: Pharmacist

## 2017-07-03 DIAGNOSIS — E1169 Type 2 diabetes mellitus with other specified complication: Secondary | ICD-10-CM

## 2017-07-03 DIAGNOSIS — Z794 Long term (current) use of insulin: Secondary | ICD-10-CM | POA: Diagnosis not present

## 2017-07-03 DIAGNOSIS — E119 Type 2 diabetes mellitus without complications: Secondary | ICD-10-CM | POA: Diagnosis not present

## 2017-07-03 NOTE — Progress Notes (Signed)
    S:    No chief complaint on file.  Patient arrives in good spirits. Presents for medication reconciliation and Victoza titration at the request of patient's PCP. Patient was referred on 06/19/17. She was last seen by PCP on 06/19/17.   Patient reports adherence with medications.  Current diabetes medications include:  -Lantus 40 units into the skin 2 times daily.  -Novolog 0-12 units TID per sliding scale -Victoza 0.6mg  daily -Metformin 500 mg XR: 2 tablets BID (patient reports holding metformin since starting Victoza. States she wants to make sure it is okay to continue)  Patient denies hypoglycemic events.  Patient reported dietary habits:  -Eats 3 meals/day -Reports improved eating habits since last seeing Dr. Margarita Rana. She reports using air fryer and increasing intake of vegetables. She is "staying away from carbs" and decreasing intake of "things like pasta".  -Additionally, she states that sodas used to be a problem for her. Since starting her Victoza, patient reports eliminating soda from her diet.   Patient-reported exercise habits:  -Patient reports moving around better. Was unable to provide specific amount of physical activity per week.    Patient denies nocturia.  Patient reports neuropathy. This is baseline. She reports gabapentin not working for neuropathy.  Patient reports visual changes. Reports blurry vision that was reported to Dr. Margarita Rana. This has continued but not worsened.  Patient reports self foot exams.   O:   Lab Results  Component Value Date   HGBA1C 13.4 (A) 06/19/2017   There were no vitals filed for this visit.  No POCT glucose taken at this visit. Patient took BG before appointment and it was 186 (this was viewed on patient's meter).    Brings in meter. Home readings as follows:  Home fasting CBG: higher 100s (most 160s-180s)  2 hour post-prandial/random CBG: 100s-200s (most 180-220s)  A/P: Diabetes longstanding currently uncontrolled based on  A1c and home sugars. Patient is able to verbalize appropriate hypoglycemia management plan. She  is adherent with medication. Of note, she reports holding metformin since starting Victoza. We should be able to increase glycemic control once metformin resumed. I checked Dr. Smitty Pluck note and patient is supposed to continue metformin. Diabetes control is improving since addition of Victoza and adjustment of diet.   -Increased Victoza (liraglutide) to 1.2 mg daily.  -Advised patient to continue metformin 500mg  XR (2 tabs BID)  -Extensively discussed pathophysiology of DM, recommended lifestyle interventions, dietary effects on glycemic control -Counseled on s/sx of and management of hypoglycemia -Next A1C anticipated 09/2017.   Written patient instructions provided.  Total time in face to face counseling 15 minutes.   Follow up Pharmacist Clinic Visit in 1 week.   Benard Halsted, PharmD, [place] [phone]

## 2017-07-03 NOTE — Patient Instructions (Addendum)
Thank you for coming to see me today.   Please increase your Victoza to 1.2 mg daily. I will see you in 1 week to see if we can go up to 1.8.   Please remember to take all medications, including metformin.   If you have an sugar readings below 70, contact us. Treat your low blood sugar (if needed) the way we discussed in clinic.

## 2017-07-10 ENCOUNTER — Ambulatory Visit: Payer: Medicaid Other | Attending: Family Medicine | Admitting: Pharmacist

## 2017-07-10 ENCOUNTER — Encounter: Payer: Self-pay | Admitting: Pharmacist

## 2017-07-10 DIAGNOSIS — Z794 Long term (current) use of insulin: Secondary | ICD-10-CM | POA: Insufficient documentation

## 2017-07-10 DIAGNOSIS — E1169 Type 2 diabetes mellitus with other specified complication: Secondary | ICD-10-CM | POA: Diagnosis not present

## 2017-07-10 LAB — GLUCOSE, POCT (MANUAL RESULT ENTRY): POC GLUCOSE: 220 mg/dL — AB (ref 70–99)

## 2017-07-10 NOTE — Progress Notes (Signed)
   S:    No chief complaint on file.  Patient arrives in good spirits. Presents for Victoza titration at the request of Dr. Margarita Rana. Was last seen by me 07/03/17; Victoza increased to 1.2 mg daily. Pt states she is adjusting to this. Denies any side effects with dose increase.  Patient reports adherence with medications.   Current diabetes medications include:  -Lantus 40 units into the skin 2 times daily.  -Novolog 0-12 units TID per sliding scale -Victoza 1.2 mg daily -Metformin 500 mg XR: 2 tablets BID   Patient denies hypoglycemic events.  Patient reported dietary habits:  -Eats 3 meals/day -Reports improved eating habits since last seeing Dr. Margarita Rana. She reports using air fryer and increasing intake of vegetables. She is "staying away from carbs" and decreasing intake of "things like pasta".  -Additionally, she states that sodas used to be a problem for her. Since starting her Victoza, patient reports eliminating soda from her diet.   Patient-reported exercise habits:  -Patient reports moving around better. Was unable to provide specific amount of physical activity per week.    Patient denies nocturia.  Patient reports neuropathy. This is baseline. She reports gabapentin not working for neuropathy.  Patient reports visual changes. Reports blurry vision that was reported to Dr. Margarita Rana. This has continued but not worsened.  Patient reports self foot exams.   O:   Lab Results  Component Value Date   HGBA1C 13.4 (A) 06/19/2017   There were no vitals filed for this visit.  POCT glucose: 220. Of note, pt ate sausage biscuit and sweetened water beverage ~3 hours ago.   Brings in meter. Home readings as follows:  -Fasting: 140s-180s -Most post-prandial/random readings 160-190.   A/P: Diabetes longstanding currently uncontrolled based on A1c and home sugars. Patient is able to verbalize appropriate hypoglycemia management plan. She is adherent with medication. Home sugars  minimally improving since last time. Patient is not having any GI side effects or other unwanted side effects from Victoza.   -Increased Victoza (liraglutide) to 1.8 mg daily.  -Extensively discussed pathophysiology of DM, recommended lifestyle interventions, dietary effects on glycemic control -Counseled on s/sx of and management of hypoglycemia -Next A1C anticipated 09/2017.   Written patient instructions provided.  Total time in face to face counseling 15 minutes.   Follow up Pharmacist Clinic Visit 07/31/17.   Benard Halsted, PharmD, [place] [phone]

## 2017-07-10 NOTE — Patient Instructions (Signed)
Thank you for coming to see [name] today. Please do the following:  1. Increase Victoza to 1.8 mg daily as directed during your appointment. If you have any questions or if you believe something has occurred because of this change, call me or your doctor to let one of [name] know.  2. Continue checking blood sugars at home. It's really important that you record these and bring these in to your next doctor's appointment. If you get in readings above 500 or lower than 70, call me or the clinic to let your doctor know. See below on how to treat low blood sugar.  3. Continue making the lifestyle changes we've discussed together during our visit. Diet and exercise play a big role in improving your blood sugars.  4. Follow-up with me in 2 weeks.     Hypoglycemia or low blood sugar:   Low blood sugar can happen quickly and may become an emergency if not treated right away.   While this shouldn't happen often, it can be brought upon if you skip a meal or do not eat enough. Also, if your insulin or other diabetes medications are dosed too high, this can cause your blood sugar to go to low.   Warning signs of low blood sugar include: 1. Feeling shaky or dizzy 2. Feeling weak or tired  3. Excessive hunger 4. Feeling anxious or upset  5. Sweating even when you aren't exercising  What to do if I experience low blood sugar? 1. Check your blood sugar with your meter. If lower than 70, proceed to step 2.  2. Treat with 3-4 glucose tablets or 3 packets of regular sugar. If these aren't around, you can try hard candy. Yet another option would be to drink 4 ounces of fruit juice or 6 ounces of REGULAR soda.  3. Re-check your sugar in 15 minutes. If it is still below 70, do what you did in step 2 again. If has come back up, go ahead and eat a snack or small meal at this time.

## 2017-07-11 ENCOUNTER — Encounter: Payer: Self-pay | Admitting: Internal Medicine

## 2017-07-13 ENCOUNTER — Other Ambulatory Visit: Payer: Self-pay | Admitting: Family Medicine

## 2017-07-13 DIAGNOSIS — J3089 Other allergic rhinitis: Secondary | ICD-10-CM

## 2017-07-14 ENCOUNTER — Other Ambulatory Visit: Payer: Self-pay | Admitting: Family Medicine

## 2017-07-14 DIAGNOSIS — M5416 Radiculopathy, lumbar region: Secondary | ICD-10-CM

## 2017-07-14 DIAGNOSIS — M1712 Unilateral primary osteoarthritis, left knee: Secondary | ICD-10-CM

## 2017-07-23 ENCOUNTER — Other Ambulatory Visit: Payer: Self-pay | Admitting: Family Medicine

## 2017-07-23 DIAGNOSIS — E1149 Type 2 diabetes mellitus with other diabetic neurological complication: Secondary | ICD-10-CM

## 2017-07-25 ENCOUNTER — Other Ambulatory Visit: Payer: Self-pay | Admitting: Family Medicine

## 2017-07-25 DIAGNOSIS — E1169 Type 2 diabetes mellitus with other specified complication: Secondary | ICD-10-CM

## 2017-07-26 ENCOUNTER — Other Ambulatory Visit: Payer: Self-pay | Admitting: Family Medicine

## 2017-07-26 DIAGNOSIS — B3789 Other sites of candidiasis: Secondary | ICD-10-CM

## 2017-07-30 ENCOUNTER — Ambulatory Visit: Payer: Medicaid Other | Admitting: Dietician

## 2017-07-31 ENCOUNTER — Ambulatory Visit: Payer: Medicaid Other | Admitting: Pharmacist

## 2017-08-02 ENCOUNTER — Ambulatory Visit: Payer: Medicaid Other | Attending: Family Medicine | Admitting: Pharmacist

## 2017-08-02 DIAGNOSIS — Z794 Long term (current) use of insulin: Secondary | ICD-10-CM | POA: Insufficient documentation

## 2017-08-02 DIAGNOSIS — E1169 Type 2 diabetes mellitus with other specified complication: Secondary | ICD-10-CM | POA: Diagnosis present

## 2017-08-02 MED ORDER — LIRAGLUTIDE 18 MG/3ML ~~LOC~~ SOPN: 1.8000 mg | PEN_INJECTOR | Freq: Every day | SUBCUTANEOUS | 2 refills | Status: DC

## 2017-08-02 NOTE — Progress Notes (Signed)
S:    No chief complaint on file.  Patient arrives in good spirits. Presents for diabetes management at the request of Dr. Margarita Rana. Patient has been following with me over the past several weeks for Victoza titration. During her last visit with me (07/10/17), I increased her Victoza from 1.2 mg to 1.8 mg daily.  Today, pt reports no problems with Victoza increase.   Family/Social History:  Mother - DM, stroke, HTN Sister - heart attack Tobacco: former smoker, quit last year (08/01/2016) Alcohol: does not drink alcohol  Insurance coverage/medication affordability:  -[place] Medicaid  Patient reports adherence with medications. Of note, she is taking Novolog and Lantus differently than prescribed.  Current diabetes medications as prescribed include:  - Metformin 500mg  XR: 2 tablets BID - Lantus 40 units BID. Pt reports taking 72 units at night.  - Novolog 0 - 12 units with meals per sliding scale. Pt reports taking 20 units before each meal. - Victoza 1.8 mg daily   Patient denies hypoglycemic events.  Patient reported dietary habits:  - 3 meals/day - Pt reports that she gets what she can afford from the dollar store. States that she tries to avoid junk food like chips.  Patient-reported exercise habits:  - No changes in exercise since the last time I saw pt.   Patient denies nocturia.  Patient reports neuropathy that is baseline. No changes since last visit.  Patient denies visual changes. Patient denies self foot exams.   O:   Lab Results  Component Value Date   HGBA1C 13.4 (A) 06/19/2017   There were no vitals filed for this visit.  Lipid Panel     Component Value Date/Time   CHOL 167 04/25/2016 1130   TRIG 246 (H) 04/25/2016 1130   HDL 39 (L) 04/25/2016 1130   CHOLHDL 4.3 04/25/2016 1130   CHOLHDL 3.7 08/15/2012 1058   VLDL 21 08/15/2012 1058   LDLCALC 79 04/25/2016 1130   POCT: 158. She is not fasting.  Home fasting CBG: 96 - 140s 2 hour post-prandial/random  CBG: Reports most post-prandials in the high 100s.  She did not bring her meter. I had to rely on information she was telling me.   Clinical ASCVD: No  10 year ASCVD risk: 13.5% using PCE  A/P: Diabetes longstanding currently uncontrolled based on A1c of 13.4. Reported home sugars are improving. Patient is able to verbalize appropriate hypoglycemia management plan. Patient is adherent with medication, however, she reveals today that she's taking multiple of her medications differently than prescribed. Dr. Margarita Rana has her on sliding scale but she tells me that she takes 20 units of Novolog with each meal. Additionally, she takes 72 units of Lantus at bedtime instead of the 40 units BID (80 units daily) that Dr. Margarita Rana wrote for.  I have advised patient to take Lantus, Victoza, and metformin daily as prescribed. Emphasized that Novolog is to be used as needed per sliding scale. Sheet provided to patient with sliding scale instructions. She verbalizes understanding plan. I will meet with her in a month to reassess.   -Increased dose of Lantus from 72 units daily to 80 units daily (divided into 40 unit BID dosing). -Decreased dose of  Novolog from 20 units with meals to 0 -12 units per sliding scale.  -Continued Victoza 1.8 mg daily. -Continued metformin 500 mg XR (2 tablets BID).  -Extensively discussed pathophysiology of DM, recommended lifestyle interventions, dietary effects on glycemic control -Counseled on s/sx of and management of hypoglycemia -  Next A1C anticipated 09/2017.   ASCVD risk - primary prevention in patient with DM. Last LDL is controlled. ASCVD risk score is not >20%  - moderate intensity statin indicated. Aspirin is indicated.  -Continued aspirin 81 mg  -Continued atorvastatin 20 mg.   Written patient instructions provided.  Total time in face to face counseling 30 minutes.   Follow up Pharmacist Clinic Visit 09/06/17.    Ricardo Jericho, PharmD Candidate  [employer] 2020  Benard Halsted, PharmD, [place] [phone]

## 2017-08-02 NOTE — Patient Instructions (Signed)
Thank you for coming to see me today. Please do the following:  1. Increase Lantus to 40 units twice daily as directed today during your appointment. If you have any questions or if you believe something has occurred because of this change, call me or your doctor to let one of [name] know.  2. Continue taking Victoza 1.8 mg daily. 3. See attached instructions for Novolog sliding scale. 4. Continue metformin. 5. Continue checking blood sugars at home. It's really important that you record these and bring these in to your next doctor's appointment. If you get in readings above 500 or lower than 70, call me or the clinic to let your doctor know. See below on how to treat low blood sugar.  6. Continue making the lifestyle changes we've discussed together during our visit. Diet and exercise play a significant role in improving your blood sugars.  7. Follow-up with me 09/06/17.   Hypoglycemia or low blood sugar:   Low blood sugar can happen quickly and may become an emergency if not treated right away.   While this shouldn't happen often, it can be brought upon if you skip a meal or do not eat enough. Also, if your insulin or other diabetes medications are dosed too high, this can cause your blood sugar to go to low.   Warning signs of low blood sugar include: 1. Feeling shaky or dizzy 2. Feeling weak or tired  3. Excessive hunger 4. Feeling anxious or upset  5. Sweating even when you aren't exercising  What to do if I experience low blood sugar? 1. Check your blood sugar with your meter. If lower than 70, proceed to step 2.  2. Treat with 3-4 glucose tablets or 3 packets of regular sugar. If these aren't around, you can try hard candy. Yet another option would be to drink 4 ounces of fruit juice or 6 ounces of REGULAR soda.  3. Re-check your sugar in 15 minutes. If it is still below 70, do what you did in step 2 again. If has come back up, go ahead and eat a snack or small meal at this time.

## 2017-08-03 ENCOUNTER — Encounter: Payer: Self-pay | Admitting: Pharmacist

## 2017-08-03 LAB — GLUCOSE, POCT (MANUAL RESULT ENTRY): POC GLUCOSE: 158 mg/dL — AB (ref 70–99)

## 2017-08-06 ENCOUNTER — Ambulatory Visit: Payer: Medicaid Other | Admitting: Pharmacist

## 2017-08-22 ENCOUNTER — Other Ambulatory Visit: Payer: Self-pay | Admitting: Family Medicine

## 2017-08-22 DIAGNOSIS — Z794 Long term (current) use of insulin: Principal | ICD-10-CM

## 2017-08-22 DIAGNOSIS — E1169 Type 2 diabetes mellitus with other specified complication: Secondary | ICD-10-CM

## 2017-09-06 ENCOUNTER — Ambulatory Visit: Payer: Medicaid Other | Attending: Family Medicine | Admitting: Pharmacist

## 2017-09-06 ENCOUNTER — Encounter: Payer: Self-pay | Admitting: Pharmacist

## 2017-09-06 DIAGNOSIS — Z87891 Personal history of nicotine dependence: Secondary | ICD-10-CM | POA: Diagnosis not present

## 2017-09-06 DIAGNOSIS — Z823 Family history of stroke: Secondary | ICD-10-CM | POA: Diagnosis not present

## 2017-09-06 DIAGNOSIS — E1140 Type 2 diabetes mellitus with diabetic neuropathy, unspecified: Secondary | ICD-10-CM | POA: Insufficient documentation

## 2017-09-06 DIAGNOSIS — Z833 Family history of diabetes mellitus: Secondary | ICD-10-CM | POA: Insufficient documentation

## 2017-09-06 DIAGNOSIS — E1169 Type 2 diabetes mellitus with other specified complication: Secondary | ICD-10-CM

## 2017-09-06 DIAGNOSIS — Z794 Long term (current) use of insulin: Secondary | ICD-10-CM | POA: Insufficient documentation

## 2017-09-06 DIAGNOSIS — Z8249 Family history of ischemic heart disease and other diseases of the circulatory system: Secondary | ICD-10-CM | POA: Diagnosis not present

## 2017-09-06 DIAGNOSIS — E119 Type 2 diabetes mellitus without complications: Secondary | ICD-10-CM | POA: Diagnosis present

## 2017-09-06 LAB — GLUCOSE, POCT (MANUAL RESULT ENTRY): POC Glucose: 217 mg/dl — AB (ref 70–99)

## 2017-09-06 MED ORDER — ACCU-CHEK FASTCLIX LANCETS MISC: 12 refills | Status: DC

## 2017-09-06 MED ORDER — INSULIN GLARGINE 100 UNIT/ML SOLOSTAR PEN: 45.0000 [IU] | PEN_INJECTOR | Freq: Two times a day (BID) | SUBCUTANEOUS | 99 refills | Status: DC

## 2017-09-06 MED ORDER — LIRAGLUTIDE 18 MG/3ML ~~LOC~~ SOPN: 1.8000 mg | PEN_INJECTOR | Freq: Every day | SUBCUTANEOUS | 2 refills | Status: DC

## 2017-09-06 NOTE — Progress Notes (Signed)
    S:  PCP: Dr. Margarita Rana    No chief complaint on file.  Patient arrives in good spirits. Presents for diabetes management at the request of Dr. Margarita Rana. At her last visit with (08/02/17), I adjusted her diabetes regimen. She reports no problems with medications.  Family/Social History:  Mother - DM, stroke, HTN Sister - heart attack Tobacco: former smoker, quit last year (08/01/2016) Alcohol: does not drink alcohol  Insurance coverage/medication affordability:  -[place] Medicaid  Patient reports adherence with medications.  Current diabetes medications: - Metformin 500mg  XR: 2 tablets BID - Lantus 40 units BID.  - Novolog 0 - 12 units with meals per sliding scale. Reports using only 3-4 times with 2 units every time since seeing me. - Victoza 1.8 mg daily   Patient denies hypoglycemic events.  Patient reported dietary habits:  - 3 meals/day - Vegetables and fruits  - Sometimes something quick (burger) but overall doing better  Patient-reported exercise habits:  - No changes in exercise since the last time I saw pt. - Reported having more energy    Patient denies nocturia.  Patient reports neuropathy that is baseline. No changes since last visit.  Patient denies visual changes. Patient denies self foot exams.   O:   Lab Results  Component Value Date   HGBA1C 13.4 (A) 06/19/2017   There were no vitals filed for this visit.  Lipid Panel     Component Value Date/Time   CHOL 167 04/25/2016 1130   TRIG 246 (H) 04/25/2016 1130   HDL 39 (L) 04/25/2016 1130   CHOLHDL 4.3 04/25/2016 1130   CHOLHDL 3.7 08/15/2012 1058   VLDL 21 08/15/2012 1058   LDLCALC 79 04/25/2016 1130   POCT: 217. Ate a sandwich 30 minutes ago Home glucometer avgs:  - 7 day: 146 - 14 day: 156 - 30 day: 160  Clinical ASCVD: No  10 year ASCVD risk: 13.5% using PCE  A/P: Diabetes longstanding currently uncontrolled based on A1c of 13.4. Reported home sugars are improving. Patient is able to  verbalize appropriate hypoglycemia management plan. Patient is adherent with medication. She is taking everything as prescribed.   Will increase Lantus to achieve better glycemic control and have pt follow-up with PCP. Of note, she reports using Novolog 2 units 3-4 times since last visit per sliding scale. Will have her hold this for now and increase Lantus in hope of decreasing her need for Novolog.   -Increased dose of Lantus from 40 units BID to 45 units BID -Continued Victoza 1.8 mg daily. -Continued metformin 500 mg XR (2 tablets BID).  -Will have patient stop sliding scale for now and follow-up with PCP.  -Extensively discussed pathophysiology of DM, recommended lifestyle interventions, dietary effects on glycemic control -Counseled on s/sx of and management of hypoglycemia -Next A1C anticipated 09/2017.   ASCVD risk - primary prevention in patient with DM. Last LDL is controlled. ASCVD risk score is not >20%  - moderate intensity statin indicated. Aspirin is indicated.  -Continued aspirin 81 mg  -Continued atorvastatin 20 mg.   Written patient instructions provided.  Total time in face to face counseling 30 minutes.   Follow up with PCP 09/24/17.    Pt seen with: Marylene Buerger, PharmD Candidate [place] Class [employer] 2021  Benard Halsted, PharmD, [place] [phone]

## 2017-09-06 NOTE — Patient Instructions (Signed)
Thank you for coming to see me today. Please do the following:  1. Increase Lantus 45 units 2 times daily 2. Continue Victoza 1.8 mg daily 3. Continue metformin 500 mg 2 tablets 2 times daily 4. Continue checking blood sugars at home. It's really important that you record these and bring these in to your next doctor's appointment. If you get in readings above 500 or lower than 70, call me or the clinic to let your doctor know. See below on how to treat low blood sugar.  5. Continue making the lifestyle changes we've discussed together during our visit. Diet and exercise play a significant role in improving your blood sugars.  6. Follow-up with PCP    Hypoglycemia or low blood sugar:   Low blood sugar can happen quickly and may become an emergency if not treated right away.   While this shouldn't happen often, it can be brought upon if you skip a meal or do not eat enough. Also, if your insulin or other diabetes medications are dosed too high, this can cause your blood sugar to go to low.   Warning signs of low blood sugar include: 1. Feeling shaky or dizzy 2. Feeling weak or tired  3. Excessive hunger 4. Feeling anxious or upset  5. Sweating even when you aren't exercising  What to do if I experience low blood sugar? 1. Check your blood sugar with your meter. If lower than 70, proceed to step 2.  2. Treat with 3-4 glucose tablets or 3 packets of regular sugar. If these aren't around, you can try hard candy. Yet another option would be to drink 4 ounces of fruit juice or 6 ounces of REGULAR soda.  3. Re-check your sugar in 15 minutes. If it is still below 70, do what you did in step 2 again. If has come back up, go ahead and eat a snack or small meal at this time.

## 2017-09-18 ENCOUNTER — Other Ambulatory Visit: Payer: Self-pay | Admitting: Family Medicine

## 2017-09-18 DIAGNOSIS — E1169 Type 2 diabetes mellitus with other specified complication: Secondary | ICD-10-CM

## 2017-09-19 ENCOUNTER — Ambulatory Visit: Payer: Medicaid Other | Admitting: Family Medicine

## 2017-09-24 ENCOUNTER — Ambulatory Visit: Payer: Medicaid Other | Admitting: Family Medicine

## 2017-09-26 ENCOUNTER — Other Ambulatory Visit: Payer: Self-pay | Admitting: Family Medicine

## 2017-09-26 DIAGNOSIS — E1149 Type 2 diabetes mellitus with other diabetic neurological complication: Secondary | ICD-10-CM

## 2017-10-18 ENCOUNTER — Other Ambulatory Visit: Payer: Self-pay | Admitting: Family Medicine

## 2017-10-18 DIAGNOSIS — E1169 Type 2 diabetes mellitus with other specified complication: Secondary | ICD-10-CM

## 2017-10-18 DIAGNOSIS — J3089 Other allergic rhinitis: Secondary | ICD-10-CM

## 2017-11-04 ENCOUNTER — Ambulatory Visit (HOSPITAL_COMMUNITY)
Admission: EM | Admit: 2017-11-04 | Discharge: 2017-11-04 | Disposition: A | Discharge status: Home / Self Care | Payer: Medicaid Other | Attending: Family Medicine | Admitting: Family Medicine

## 2017-11-04 ENCOUNTER — Encounter (HOSPITAL_COMMUNITY): Payer: Self-pay | Admitting: *Deleted

## 2017-11-04 DIAGNOSIS — M545 Low back pain, unspecified: Secondary | ICD-10-CM

## 2017-11-04 MED ORDER — PREDNISONE 20 MG PO TABS: 20.0000 mg | ORAL_TABLET | Freq: Every day | ORAL | 0 refills | Status: DC

## 2017-11-04 MED ORDER — TIZANIDINE HCL 4 MG PO TABS: 4.0000 mg | ORAL_TABLET | Freq: Every day | ORAL | 0 refills | Status: DC

## 2017-11-04 NOTE — ED Provider Notes (Signed)
[place]   [id number] 11/04/17 Arrival Time: 0539  CC: Back PAIN  SUBJECTIVE: History from: patient. Tara Keller is a 52 y.o. female complains of low back pain that began 2 weeks ago.  Denies a precipitating event or specific injury.  Denies repetitive movements or recent heavy lifting.  Localizes the pain to the right low back.  Describes the pain as intermittent and becoming constant and ">10"/10.  Has tried OTC medications including tylenol and muscle relaxor without relief.  Symptoms are made worse with back ROM and laying down.  Denies similar symptoms in the past.  Denies fever, chills, erythema, ecchymosis, effusion, saddle paresthesias, weakness, numbness and tingling, or loss of bowel or bladder incontinence.      ROS: As per HPI.  Past Medical History:  Diagnosis Date  . Anxiety   . Arthritis   . Asthma   . Depression   . Diabetes mellitus   . Fatty liver   . Fracture closed of upper end of forearm 9 years, Fx left left leg  . Fracture of left lower leg @ 52 years old  . H/O tubal ligation   . Hiatal hernia   . Hypertension   . Obesity   . Positive H. pylori test   . Sleep apnea    uses CPAP  . Tubular adenoma of colon    Past Surgical History:  Procedure Laterality Date  . CHOLECYSTECTOMY    . COLONOSCOPY WITH PROPOFOL N/A 08/15/2013   Procedure: COLONOSCOPY WITH PROPOFOL;  Surgeon: Jerene Bears, MD;  Location: [facility];  Service: Gastroenterology;  Laterality: N/A;  . ESOPHAGOGASTRODUODENOSCOPY N/A 05/12/2014   Procedure: ESOPHAGOGASTRODUODENOSCOPY (EGD);  Surgeon: Jerene Bears, MD;  Location: [facility];  Service: Gastroenterology;  Laterality: N/A;  . ESOPHAGOGASTRODUODENOSCOPY (EGD) WITH PROPOFOL N/A 08/15/2013   Procedure: ESOPHAGOGASTRODUODENOSCOPY (EGD) WITH PROPOFOL;  Surgeon: Jerene Bears, MD;  Location: [facility];  Service: Gastroenterology;  Laterality: N/A;  . TUBAL LIGATION Bilateral    No Known Allergies No current  facility-administered medications on file prior to encounter.    Current Outpatient Medications on File Prior to Encounter  Medication Sig Dispense Refill  . aspirin EC 81 MG tablet Take 1 tablet (81 mg total) by mouth daily. 90 tablet 3  . atorvastatin (LIPITOR) 20 MG tablet Take 1 tablet (20 mg total) by mouth daily. 90 tablet 1  . buPROPion (WELLBUTRIN XL) 150 MG 24 hr tablet Take 3 tablets (450 mg total) daily by mouth. 30 tablet 0  . cetirizine (ZYRTEC) 10 MG tablet TAKE ONE TABLET (10 MG) BY MOUTH EVERY DAY 30 tablet 2  . clonazePAM (KLONOPIN) 1 MG tablet Take 1 mg by mouth at bedtime.    . gabapentin (NEURONTIN) 300 MG capsule TAKE ONE CAPSULE BY MOUTH TWICE DAILY 60 capsule 2  . liraglutide (VICTOZA) 18 MG/3ML SOPN Inject 0.3 mLs (1.8 mg total) into the skin daily. 9 mL 2  . lisinopril (PRINIVIL,ZESTRIL) 5 MG tablet Take 1 tablet (5 mg total) by mouth daily. 90 tablet 1  . metFORMIN (GLUCOPHAGE-XR) 500 MG 24 hr tablet TAKE TWO TABLETS BY MOUTH TWICE DAILY 120 tablet 2  . methocarbamol (ROBAXIN) 500 MG tablet Take 2 tablets (1,000 mg total) by mouth every 12 (twelve) hours as needed for muscle spasms. 120 tablet 3  . metoCLOPramide (REGLAN) 5 MG tablet TAKE ONE TABLET BY MOUTH EVERY DAY 30 tablet 2  . pantoprazole (PROTONIX) 40 MG tablet Take 1 tablet (40 mg total) by mouth daily.  30 tablet 6  . Vitamin D, Ergocalciferol, (DRISDOL) 50000 units CAPS capsule Take 1 capsule (50,000 Units total) by mouth every 7 (seven) days. 16 capsule 0  . zolpidem (AMBIEN) 5 MG tablet Take 5 mg by mouth at bedtime.    [name] ACCU-CHEK FASTCLIX LANCETS MISC Use as directed 100 each 12  . albuterol (PROVENTIL HFA;VENTOLIN HFA) 108 (90 Base) MCG/ACT inhaler Inhale 1-2 puffs into the lungs every 6 (six) hours as needed for wheezing or shortness of breath. 1 Inhaler 0  . benzonatate (TESSALON) 100 MG capsule Take 1 capsule (100 mg total) by mouth every 8 (eight) hours. 21 capsule 0  . Blood Glucose Monitoring Suppl  (ACCU-CHEK AVIVA PLUS) w/Device KIT 1 each by Does not apply route 3 (three) times daily. 1 kit 0  . glucose blood (ACCU-CHEK AVIVA PLUS) test strip TEST three times a day. E11.9 100 each 12  . Insulin Glargine (LANTUS SOLOSTAR) 100 UNIT/ML Solostar Pen Inject 45 Units into the skin 2 (two) times daily. 10 pen PRN  . Insulin Pen Needle 31G X 8 MM MISC Use as directed for 4 times daily insulin administration. 100 each 12  . Insulin Syringe-Needle U-100 (BD INSULIN SYRINGE ULTRAFINE) 31G X 15/64" 0.5 ML MISC Use as directed 100 each 5  . ipratropium (ATROVENT) 0.03 % nasal spray Place 2 sprays into both nostrils 2 (two) times daily. 30 mL 0  . ketoconazole (NIZORAL) 2 % cream Apply 1 application topically 2 (two) times daily. 60 g 0  . NEEDLE, DISP, 24 G (B-D DISP NEEDLE 24GX1") 24G X 1" MISC Use with solostar pen to inject 4 times daily 200 each 1  . NYAMYC powder APPLY TOPICALLY TO THE AFFECTED AREA(S) TWICE DAILY 45 g 0  . triamcinolone cream (KENALOG) 0.1 % Apply 1 application topically 2 (two) times daily. 45 g 1   Social History   Socioeconomic History  . Marital status: Single    Spouse name: Not on file  . Number of children: 4  . Years of education: Not on file  . Highest education level: Not on file  Occupational History  . Occupation: disabled  Social Needs  . Financial resource strain: Not on file  . Food insecurity:    Worry: Not on file    Inability: Not on file  . Transportation needs:    Medical: Not on file    Non-medical: Not on file  Tobacco Use  . Smoking status: Current Some Day Smoker    Packs/day: 0.25    Types: Cigarettes    Last attempt to quit: 08/01/2016    Years since quitting: 1.2  . Smokeless tobacco: Never Used  Substance and Sexual Activity  . Alcohol use: No    Alcohol/week: 0.0 standard drinks  . Drug use: No  . Sexual activity: Not on file  Lifestyle  . Physical activity:    Days per week: Not on file    Minutes per session: Not on file  .  Stress: Not on file  Relationships  . Social connections:    Talks on phone: Not on file    Gets together: Not on file    Attends religious service: Not on file    Active member of club or organization: Not on file    Attends meetings of clubs or organizations: Not on file    Relationship status: Not on file  . Intimate partner violence:    Fear of current or ex partner: Not on file  Emotionally abused: Not on file    Physically abused: Not on file    Forced sexual activity: Not on file  Other Topics Concern  . Not on file  Social History Narrative   She lives with fiance.  Her daughter died in 03/11/2015 at the age of 60.   She is on disability since 03-10-90   Highest level of education:  Working on [employer]   Family History  Problem Relation Age of Onset  . Diabetes Mother   . Stroke Mother   . Hypertension Mother   . Breast cancer Sister   . Heart attack Sister   . Other Daughter        died at age 14  . Cirrhosis Father   . Colon cancer Maternal Uncle     OBJECTIVE:  Vitals:   11/04/17 1119 11/04/17 1120  BP:  139/88  Pulse: 75   Resp: 16   Temp: 97.9 F (36.6 C)   TempSrc: Oral   SpO2: 100%     General appearance: Alert; appears uncomfortable; obese Head: NCAT Lungs: CTA bilaterally Heart: RRR.  Clear S1 and S2 without murmur, gallops, or rubs.  Radial pulses 2+ bilaterally. Musculoskeletal: Back Inspection: Skin warm, dry, clear and intact without obvious erythema, effusion, or ecchymosis.  Palpation: Tender to palpation over the RT lower paravertebral muscles; no palpable spasm; no midline tenderness ROM: LROM Strength Bilaterally: 5/5 shld abduction, 5/5 shld adduction, 5/5 elbow flexion, 5/5 elbow extension, 5/5 grip strength, 5/5 hip flexion, 5/5 knee abduction, 5/5 knee adduction, 5/5 knee flexion, 5/5 knee extension, 5/5 dorsiflexion, 5/5 plantar flexion Skin: warm and dry Neurologic: Ambulates with minimal difficulty due to pain; Sensation intact about the  upper/ lower extremities Psychological: alert and cooperative; normal mood and affect  ASSESSMENT & PLAN:  1. Acute right-sided low back pain without sciatica    Meds ordered this encounter  Medications  . predniSONE (DELTASONE) 20 MG tablet    Sig: Take 1 tablet (20 mg total) by mouth daily with breakfast for 5 days.    Dispense:  5 tablet    Refill:  0    Order Specific Question:   Supervising Provider    Answer:   Wynona Luna [phone]  . tiZANidine (ZANAFLEX) 4 MG tablet    Sig: Take 1 tablet (4 mg total) by mouth at bedtime.    Dispense:  15 tablet    Refill:  0    Order Specific Question:   Supervising Provider    Answer:   Wynona Luna [[id number]]   Declines steroid shot in office.   Continue conservative management of rest, ice, heat, and gentle stretches Prednisone prescribed.  Take as directed and to completion Discontinue muscle relaxer Tizanidine prescribed.  Take at nighttime for symptomatic relief. Avoid driving or operating heavy machinery while using medication. Go to the ER if symptoms persist Go to the ER if symptoms (fever, chills, chest pain, abdominal pain, changes in bowel or bladder habits, pain radiating into lower legs, etc...)   Reviewed expectations re: course of current medical issues. Questions answered. Outlined signs and symptoms indicating need for more acute intervention. Patient verbalized understanding. After Visit Summary given.    Tara Box, PA-C 11/04/17 1244

## 2017-11-04 NOTE — Discharge Instructions (Signed)
Declines steroid shot in office.   Continue conservative management of rest, ice, heat, and gentle stretches Prednisone prescribed.  Take as directed and to completion Discontinue muscle relaxer Tizanidine prescribed.  Take at nighttime for symptomatic relief. Avoid driving or operating heavy machinery while using medication. Go to the ER if symptoms persist Go to the ER if symptoms (fever, chills, chest pain, abdominal pain, changes in bowel or bladder habits, pain radiating into lower legs, etc...)

## 2017-11-04 NOTE — ED Triage Notes (Signed)
Denies injury.  C/O low back pain without radiation x 2 wks with progressive worsening.  Denies parasthesias.

## 2017-11-06 NOTE — Progress Notes (Signed)
Patient ID: Tara Keller, female   DOB: 1965-04-28, 52 y.o.   MRN: [id number]       Tara Keller, is a 52 y.o. female  [id number]:[id number]  [id number]  DOB - [birth year]  Subjective:  Chief Complaint and HPI: Tara Keller is a 52 y.o. female here today for a follow up visit After being seen in the ED 11/04/2017 for low back pain/sciatica.  She was treated with zanaflex and prednisone. She hasn't been taking her lantus the last couple of days but overall she takes it consistently.  She stopped taking prednisone and lantus the last few days.  Prednisone seemed to be helping with her back pain.  zanaflex gave some relief.  Back pain is B lower back for about 2 weeks it has been flared up.  NKI.  No paresthesias/radiculopathy.  ED/Hospital notes reviewed.     ROS:   Constitutional:  No f/c, No night sweats, No unexplained weight loss. EENT:  No vision changes, No blurry vision, No hearing changes. No mouth, throat, or ear problems.  Respiratory: No cough, No SOB Cardiac: No CP, no palpitations GI:  No abd pain, No N/V/D. GU: No Urinary s/sx Musculoskeletal: +B low back pain Neuro: No headache, no dizziness, no motor weakness.  Skin: No rash Endocrine:  No polydipsia. No polyuria.  Psych: Denies SI/HI  No problems updated.  ALLERGIES: No Known Allergies  PAST MEDICAL HISTORY: Past Medical History:  Diagnosis Date  . Anxiety   . Arthritis   . Asthma   . Depression   . Diabetes mellitus   . Fatty liver   . Fracture closed of upper end of forearm 9 years, Fx left left leg  . Fracture of left lower leg @ 52 years old  . H/O tubal ligation   . Hiatal hernia   . Hypertension   . Obesity   . Positive H. pylori test   . Sleep apnea    uses CPAP  . Tubular adenoma of colon     MEDICATIONS AT HOME: Prior to Admission medications   Medication Sig Start Date End Date Taking? Authorizing Provider  ACCU-CHEK FASTCLIX LANCETS MISC Use as directed 09/06/17  Yes Charlott Rakes, MD    albuterol (PROVENTIL HFA;VENTOLIN HFA) 108 (90 Base) MCG/ACT inhaler Inhale 1-2 puffs into the lungs every 6 (six) hours as needed for wheezing or shortness of breath. 01/15/49  Yes Delora Fuel, MD  aspirin EC 81 MG tablet Take 1 tablet (81 mg total) by mouth daily. 04/25/16  Yes Langeland, Dawn T, MD  atorvastatin (LIPITOR) 20 MG tablet Take 1 tablet (20 mg total) by mouth daily. 06/19/17  Yes Charlott Rakes, MD  Blood Glucose Monitoring Suppl (ACCU-CHEK AVIVA PLUS) w/Device KIT 1 each by Does not apply route 3 (three) times daily. 08/11/16  Yes Zhanae Proffit M, PA-C  buPROPion (WELLBUTRIN XL) 150 MG 24 hr tablet Take 3 tablets (450 mg total) daily by mouth. 11/23/16  Yes Newlin, Enobong, MD  cetirizine (ZYRTEC) 10 MG tablet TAKE ONE TABLET (10 MG) BY MOUTH EVERY DAY 10/18/17  Yes Newlin, Enobong, MD  clonazePAM (KLONOPIN) 1 MG tablet Take 1 mg by mouth at bedtime.   Yes [provider]  gabapentin (NEURONTIN) 300 MG capsule TAKE ONE CAPSULE BY MOUTH TWICE DAILY 07/23/17  Yes Newlin, Enobong, MD  glucose blood (ACCU-CHEK AVIVA PLUS) test strip TEST three times a day. E11.9 10/24/16  Yes Newlin, Charlane Ferretti, MD  Insulin Glargine (LANTUS SOLOSTAR) 100 UNIT/ML Solostar Pen Inject 48 Units into  the skin 2 (two) times daily. 11/07/17  Yes Myrl Lazarus M, PA-C  Insulin Pen Needle 31G X 8 MM MISC Use as directed for 4 times daily insulin administration. 11/07/16  Yes Tresa Garter, MD  Insulin Syringe-Needle U-100 (BD INSULIN SYRINGE ULTRAFINE) 31G X 15/64" 0.5 ML MISC Use as directed 08/11/15  Yes Jegede, Olugbemiga E, MD  ipratropium (ATROVENT) 0.03 % nasal spray Place 2 sprays into both nostrils 2 (two) times daily. 03/27/17  Yes Robyn Haber, MD  ketoconazole (NIZORAL) 2 % cream Apply 1 application topically 2 (two) times daily. 03/27/17  Yes Robyn Haber, MD  liraglutide (VICTOZA) 18 MG/3ML SOPN Inject 0.3 mLs (1.8 mg total) into the skin daily. 09/06/17  Yes Charlott Rakes, MD   lisinopril (PRINIVIL,ZESTRIL) 5 MG tablet Take 1 tablet (5 mg total) by mouth daily. 06/19/17  Yes Newlin, Charlane Ferretti, MD  metFORMIN (GLUCOPHAGE-XR) 500 MG 24 hr tablet TAKE TWO TABLETS BY MOUTH TWICE DAILY 10/18/17  Yes Newlin, Enobong, MD  methocarbamol (ROBAXIN) 500 MG tablet Take 2 tablets (1,000 mg total) by mouth 3 (three) times daily. X 10 days then prn muscle spasm 11/07/17  Yes Moranda Billiot M, PA-C  metoCLOPramide (REGLAN) 5 MG tablet TAKE ONE TABLET BY MOUTH EVERY DAY 06/07/17  Yes Pyrtle, Lajuan Lines, MD  NEEDLE, DISP, 24 G (B-D DISP NEEDLE 24GX1") 24G X 1" MISC Use with solostar pen to inject 4 times daily 07/09/14  Yes Jegede, Marlena Clipper, MD  [facility] powder APPLY TOPICALLY TO THE AFFECTED AREA(S) TWICE DAILY 07/26/17  Yes Newlin, Enobong, MD  pantoprazole (PROTONIX) 40 MG tablet Take 1 tablet (40 mg total) by mouth daily. 06/19/17  Yes Charlott Rakes, MD  triamcinolone cream (KENALOG) 0.1 % Apply 1 application topically 2 (two) times daily. 01/18/17  Yes Argentina Donovan, PA-C  Vitamin D, Ergocalciferol, (DRISDOL) 50000 units CAPS capsule Take 1 capsule (50,000 Units total) by mouth every 7 (seven) days. 06/21/17  Yes Newlin, Charlane Ferretti, MD  zolpidem (AMBIEN) 5 MG tablet Take 5 mg by mouth at bedtime.   Yes [provider]  diclofenac (VOLTAREN) 75 MG EC tablet Take 1 tablet (75 mg total) by mouth 2 (two) times daily. X 10 days then prn pain 11/07/17   Argentina Donovan, PA-C     Objective:  EXAM:   Vitals:   11/07/17 1359  BP: 119/80  Pulse: 82  Resp: 18  Temp: 98.2 F (36.8 C)  TempSrc: Oral  SpO2: 100%  Weight: (!) 322 lb (146.1 kg)  Height: 5' 6" (1.676 m)    General appearance : A&OX3. NAD. Non-toxic-appearing HEENT: Atraumatic and Normocephalic.  PERRLA. EOM intact.  Neck: supple, no JVD. No cervical lymphadenopathy. No thyromegaly Chest/Lungs:  Breathing-non-labored, Good air entry bilaterally, breath sounds normal without rales, rhonchi, or wheezing  CVS: S1 S2  regular, no murmurs, gallops, rubs  Back:  ROM limited partially by obesity.  +paraspinus spasm across lower back.  Neg SLR B.  DTR=intact.  Normal gait and ambulation.   Extremities: Bilateral Lower Ext shows no edema, both legs are warm to touch with = pulse throughout Neurology:  CN II-XII grossly intact, Non focal.   Psych:  TP linear. J/I WNL. Normal speech. Appropriate eye contact and affect.  Skin:  No Rash  Data Review Lab Results  Component Value Date   HGBA1C 10.2 (A) 11/07/2017   HGBA1C 13.4 (A) 06/19/2017   HGBA1C 8.7 02/16/2017     Assessment & Plan   1. Chronic midline low back  pain with right-sided sciatica - methylPREDNISolone acetate (DEPO-MEDROL) injection 80 mg - diclofenac (VOLTAREN) 75 MG EC tablet; Take 1 tablet (75 mg total) by mouth 2 (two) times daily. X 10 days then prn pain  Dispense: 60 tablet; Refill: 0 - methocarbamol (ROBAXIN) 500 MG tablet; Take 2 tablets (1,000 mg total) by mouth 3 (three) times daily. X 10 days then prn muscle spasm  Dispense: 120 tablet; Refill: 3  2. Type 2 diabetes mellitus with other specified complication, with long-term current use of insulin (HCC) Uncontrolled.  Increase alntus to 48 units bid.  Check blood sugars bid and f/up in 3 weeks with Lurena Joiner for Diabetes check Watch for hyperglycemia on prednisone. - Glucose (CBG) - HgB A1c - Comprehensive metabolic panel - Insulin Glargine (LANTUS SOLOSTAR) 100 UNIT/ML Solostar Pen; Inject 48 Units into the skin 2 (two) times daily.  Dispense: 10 pen; Refill: PRN  3. Encounter for examination following treatment at hospital Some improvement     Patient have been counseled extensively about nutrition and exercise  Return in about 3 weeks (around 11/28/2017) for [facility] for DM management; 3 months with Dr Margarita Rana.  The patient was given clear instructions to go to ER or return to medical center if symptoms don't improve, worsen or new problems develop. The patient verbalized  understanding. The patient was told to call to get lab results if they haven't heard anything in the next week.     [name], PA-C [facility] [place], [place]   11/07/2017, 2:12 PM

## 2017-11-07 ENCOUNTER — Ambulatory Visit: Payer: Medicaid Other | Attending: Family Medicine | Admitting: Physician Assistant

## 2017-11-07 VITALS — BP 119/80 | HR 82 | Temp 98.2°F | Resp 18 | Ht 66.0 in | Wt 322.0 lb

## 2017-11-07 DIAGNOSIS — E1169 Type 2 diabetes mellitus with other specified complication: Secondary | ICD-10-CM | POA: Insufficient documentation

## 2017-11-07 DIAGNOSIS — K449 Diaphragmatic hernia without obstruction or gangrene: Secondary | ICD-10-CM | POA: Diagnosis not present

## 2017-11-07 DIAGNOSIS — M5441 Lumbago with sciatica, right side: Secondary | ICD-10-CM | POA: Diagnosis present

## 2017-11-07 DIAGNOSIS — Z79899 Other long term (current) drug therapy: Secondary | ICD-10-CM | POA: Insufficient documentation

## 2017-11-07 DIAGNOSIS — I10 Essential (primary) hypertension: Secondary | ICD-10-CM | POA: Diagnosis not present

## 2017-11-07 DIAGNOSIS — E669 Obesity, unspecified: Secondary | ICD-10-CM | POA: Insufficient documentation

## 2017-11-07 DIAGNOSIS — F329 Major depressive disorder, single episode, unspecified: Secondary | ICD-10-CM | POA: Insufficient documentation

## 2017-11-07 DIAGNOSIS — F419 Anxiety disorder, unspecified: Secondary | ICD-10-CM | POA: Insufficient documentation

## 2017-11-07 DIAGNOSIS — G4730 Sleep apnea, unspecified: Secondary | ICD-10-CM | POA: Insufficient documentation

## 2017-11-07 DIAGNOSIS — Z794 Long term (current) use of insulin: Secondary | ICD-10-CM | POA: Diagnosis not present

## 2017-11-07 DIAGNOSIS — M1990 Unspecified osteoarthritis, unspecified site: Secondary | ICD-10-CM | POA: Insufficient documentation

## 2017-11-07 DIAGNOSIS — G8929 Other chronic pain: Secondary | ICD-10-CM

## 2017-11-07 DIAGNOSIS — J45909 Unspecified asthma, uncomplicated: Secondary | ICD-10-CM | POA: Diagnosis not present

## 2017-11-07 DIAGNOSIS — Z7982 Long term (current) use of aspirin: Secondary | ICD-10-CM | POA: Insufficient documentation

## 2017-11-07 DIAGNOSIS — Z09 Encounter for follow-up examination after completed treatment for conditions other than malignant neoplasm: Secondary | ICD-10-CM | POA: Diagnosis not present

## 2017-11-07 LAB — POCT GLYCOSYLATED HEMOGLOBIN (HGB A1C): HBA1C, POC (CONTROLLED DIABETIC RANGE): 10.2 % — AB (ref 0.0–7.0)

## 2017-11-07 LAB — GLUCOSE, POCT (MANUAL RESULT ENTRY): POC Glucose: 150 mg/dl — AB (ref 70–99)

## 2017-11-07 MED ORDER — METHOCARBAMOL 500 MG PO TABS: 1000.0000 mg | ORAL_TABLET | Freq: Three times a day (TID) | ORAL | 3 refills | Status: DC

## 2017-11-07 MED ORDER — INSULIN GLARGINE 100 UNIT/ML SOLOSTAR PEN: 48.0000 [IU] | PEN_INJECTOR | Freq: Two times a day (BID) | SUBCUTANEOUS | 99 refills | Status: DC

## 2017-11-07 MED ORDER — DICLOFENAC SODIUM 75 MG PO TBEC: 75.0000 mg | DELAYED_RELEASE_TABLET | Freq: Two times a day (BID) | ORAL | 0 refills | Status: DC

## 2017-11-07 MED ORDER — METHYLPREDNISOLONE ACETATE 80 MG/ML IJ SUSP
80.0000 mg | Freq: Once | INTRAMUSCULAR | Status: AC
  Administered 2017-11-07: 80 mg via INTRAMUSCULAR

## 2017-11-07 NOTE — Patient Instructions (Signed)
Check blood sugars fasting and at bedtime and keep log

## 2017-11-08 ENCOUNTER — Telehealth: Payer: Self-pay | Admitting: Physician Assistant

## 2017-11-08 LAB — COMPREHENSIVE METABOLIC PANEL
A/G RATIO: 1.2 (ref 1.2–2.2)
ALT: 12 IU/L (ref 0–32)
AST: 8 IU/L (ref 0–40)
Albumin: 3.9 g/dL (ref 3.5–5.5)
Alkaline Phosphatase: 160 IU/L — ABNORMAL HIGH (ref 39–117)
BUN/Creatinine Ratio: 9 (ref 9–23)
BUN: 6 mg/dL (ref 6–24)
Bilirubin Total: 0.3 mg/dL (ref 0.0–1.2)
CALCIUM: 9.4 mg/dL (ref 8.7–10.2)
CO2: 26 mmol/L (ref 20–29)
Chloride: 99 mmol/L (ref 96–106)
Creatinine, Ser: 0.65 mg/dL (ref 0.57–1.00)
GFR, EST AFRICAN AMERICAN: 116 mL/min/{1.73_m2} (ref >59)
GFR, EST NON AFRICAN AMERICAN: 101 mL/min/{1.73_m2} (ref >59)
Globulin, Total: 3.3 g/dL (ref 1.5–4.5)
Glucose: 146 mg/dL — ABNORMAL HIGH (ref 65–99)
POTASSIUM: 3.5 mmol/L (ref 3.5–5.2)
Sodium: 140 mmol/L (ref 134–144)
TOTAL PROTEIN: 7.2 g/dL (ref 6.0–8.5)

## 2017-11-08 NOTE — Telephone Encounter (Signed)
-----   Message from Argentina Donovan, [place] sent at 11/08/2017  8:12 AM EDT ----- Your kidney function looks good.  Your alkaline phosphatase is elevated which may be contributing to the pain you are having or indicate gallstones/liver issues.  Drink a lot of water and we will recheck this at your f/up. Thanks, [name], PA-C

## 2017-11-08 NOTE — Telephone Encounter (Signed)
Medical Assistant left message on patient's home and cell voicemail. Voicemail states to give a call back to [place] with [facility] at [phone]. Patient is aware of kidney function being normal and needing to drink plenty of water 80-100 oz a day to address the elevated alkaline phosphatase.

## 2017-11-13 ENCOUNTER — Telehealth: Payer: Self-pay | Admitting: Family Medicine

## 2017-11-13 NOTE — Telephone Encounter (Signed)
I need to know by name which mediations she needs, many meds were refilled in October and sent to [facility] family pharmacy which is now closed, [facility] on [address] has all their scripts now.

## 2017-11-13 NOTE — Telephone Encounter (Signed)
1) Medication(s) Requested (by name): All Meds and test strips   2) Pharmacy of Choice: [facility] on [address].   3) Special Requests: because patient pharmacy closed.    Approved medications will be sent to the pharmacy, we will reach out if there is an issue.  Requests made after 3pm may not be addressed until the following business day!  If a patient is unsure of the name of the medication(s) please note and ask patient to call back when they are able to provide all info, do not send to responsible party until all information is available!

## 2017-11-14 NOTE — Telephone Encounter (Signed)
1) Medication(s) Requested (by name): test strips  2) Pharmacy [facility] on [address].  3) Special Requests:   Approved medications will be sent to the pharmacy, we will reach out if there is an issue.  Requests made after 3pm may not be addressed until the following business day!  If a patient is unsure of the name of the medication(s) please note and ask patient to call back when they are able to provide all info, do not send to responsible party until all information is available!

## 2017-11-16 MED ORDER — GLUCOSE BLOOD VI STRP: ORAL_STRIP | 12 refills | Status: DC

## 2017-11-28 ENCOUNTER — Ambulatory Visit: Payer: Medicaid Other | Admitting: Pharmacist

## 2018-01-04 ENCOUNTER — Telehealth: Payer: Self-pay | Admitting: Family Medicine

## 2018-01-04 ENCOUNTER — Encounter (HOSPITAL_COMMUNITY): Payer: Self-pay | Admitting: Emergency Medicine

## 2018-01-04 ENCOUNTER — Ambulatory Visit (HOSPITAL_COMMUNITY)
Admission: EM | Admit: 2018-01-04 | Discharge: 2018-01-04 | Disposition: A | Discharge status: Home / Self Care | Payer: Medicaid Other | Attending: Internal Medicine | Admitting: Internal Medicine

## 2018-01-04 ENCOUNTER — Other Ambulatory Visit: Payer: Self-pay | Admitting: Pharmacist

## 2018-01-04 DIAGNOSIS — N760 Acute vaginitis: Secondary | ICD-10-CM | POA: Diagnosis not present

## 2018-01-04 MED ORDER — LIRAGLUTIDE 18 MG/3ML ~~LOC~~ SOPN: 1.8000 mg | PEN_INJECTOR | Freq: Every day | SUBCUTANEOUS | 0 refills | Status: DC

## 2018-01-04 MED ORDER — FLUCONAZOLE 200 MG PO TABS: 200.0000 mg | ORAL_TABLET | Freq: Every day | ORAL | 0 refills | Status: AC

## 2018-01-04 MED ORDER — FAMCICLOVIR 500 MG PO TABS: 500.0000 mg | ORAL_TABLET | Freq: Three times a day (TID) | ORAL | 0 refills | Status: AC

## 2018-01-04 MED ORDER — GLUCOSE BLOOD VI STRP: ORAL_STRIP | 11 refills | Status: DC

## 2018-01-04 NOTE — ED Provider Notes (Signed)
[facility]    CSN: [id number] Arrival date & time: 01/04/18  1018     History   Chief Complaint Chief Complaint  Patient presents with  . Vaginal Itching    HPI Tara Keller is a 52 y.o. female.   She has diabetes and says her sugars are out of control right now.  She presents with 3 to 4-week history of significant vulvovaginal itching, and has been scratching to the point of excoriation.  Now skin is also sore.  No pelvic pain, no unusual vaginal discharge or bleeding.  She does have some scant exudate in her skin folds from the skin rash.  No prior history of this type of vulvar irritation. Patient has been with the same partner for 8 years and is not concerned for STI.    HPI  Past Medical History:  Diagnosis Date  . Anxiety   . Arthritis   . Asthma   . Depression   . Diabetes mellitus   . Fatty liver   . Fracture closed of upper end of forearm 9 years, Fx left left leg  . Fracture of left lower leg @ 52 years old  . H/O tubal ligation   . Hiatal hernia   . Hypertension   . Obesity   . Positive H. pylori test   . Sleep apnea    uses CPAP  . Tubular adenoma of colon     Patient Active Problem List   Diagnosis Date Noted  . Lumbar radiculopathy 04/13/2017  . Back pain 10/18/2016  . Diabetic neuropathy ([place]) 09/15/2016  . Arthritis 09/15/2016  . Hx of adenomatous colonic polyps 08/04/2016  . OSA on CPAP 07/06/2015  . Depression (emotion) 07/06/2015  . Abnormal barium swallow   . Abdominal pain, epigastric 08/15/2013  . Benign neoplasm of colon 08/15/2013  . Special screening for malignant neoplasms, colon 08/15/2013  . Hepatic steatosis 05/19/2013  . History of pancreatitis 05/19/2013  . Family history of colon cancer 05/19/2013  . GERD (gastroesophageal reflux disease) 02/18/2013  . Smoking 02/18/2013  . Morbid obesity ([place]) 05/28/2012  . Type 2 diabetes mellitus ([place]) 05/28/2012  . Osteoarthritis of left and right knee 05/28/2012  .  Generalized anxiety disorder 05/28/2012    Past Surgical History:  Procedure Laterality Date  . CHOLECYSTECTOMY    . COLONOSCOPY WITH PROPOFOL N/A 08/15/2013   Procedure: COLONOSCOPY WITH PROPOFOL;  Surgeon: Jerene Bears, MD;  Location: [facility];  Service: Gastroenterology;  Laterality: N/A;  . ESOPHAGOGASTRODUODENOSCOPY N/A 05/12/2014   Procedure: ESOPHAGOGASTRODUODENOSCOPY (EGD);  Surgeon: Jerene Bears, MD;  Location: [facility];  Service: Gastroenterology;  Laterality: N/A;  . ESOPHAGOGASTRODUODENOSCOPY (EGD) WITH PROPOFOL N/A 08/15/2013   Procedure: ESOPHAGOGASTRODUODENOSCOPY (EGD) WITH PROPOFOL;  Surgeon: Jerene Bears, MD;  Location: [facility];  Service: Gastroenterology;  Laterality: N/A;  . TUBAL LIGATION Bilateral     OB History    Gravida  3   Para  3   Term  2   Preterm  1   AB      Living  4     SAB      TAB      Ectopic      Multiple  1   Live Births               Home Medications    Prior to Admission medications   Medication Sig Start Date End Date Taking? Authorizing Provider  ACCU-CHEK FASTCLIX LANCETS MISC Use as directed 09/06/17  Charlott Rakes, MD  albuterol (PROVENTIL HFA;VENTOLIN HFA) 108 (90 Base) MCG/ACT inhaler Inhale 1-2 puffs into the lungs every 6 (six) hours as needed for wheezing or shortness of breath. 01/11/43   Delora Fuel, MD  aspirin EC 81 MG tablet Take 1 tablet (81 mg total) by mouth daily. 04/25/16   Maren Reamer, MD  atorvastatin (LIPITOR) 20 MG tablet Take 1 tablet (20 mg total) by mouth daily. 06/19/17   Charlott Rakes, MD  Blood Glucose Monitoring Suppl (ACCU-CHEK AVIVA PLUS) w/Device KIT 1 each by Does not apply route 3 (three) times daily. 08/11/16   Argentina Donovan, PA-C  buPROPion (WELLBUTRIN XL) 150 MG 24 hr tablet Take 3 tablets (450 mg total) daily by mouth. 11/23/16   Charlott Rakes, MD  cetirizine (ZYRTEC) 10 MG tablet TAKE ONE TABLET (10 MG) BY MOUTH EVERY DAY 10/18/17   Charlott Rakes, MD  clonazePAM  (KLONOPIN) 1 MG tablet Take 1 mg by mouth at bedtime.    [provider]  diclofenac (VOLTAREN) 75 MG EC tablet Take 1 tablet (75 mg total) by mouth 2 (two) times daily. X 10 days then prn pain 11/07/17   [name] M, PA-C  famciclovir (FAMVIR) 500 MG tablet Take 1 tablet (500 mg total) by mouth 3 (three) times daily for 7 days. 01/04/18 01/11/18  Wynona Luna, MD  fluconazole (DIFLUCAN) 200 MG tablet Take 1 tablet (200 mg total) by mouth daily for 7 days. 01/04/18 01/11/18  Wynona Luna, MD  gabapentin (NEURONTIN) 300 MG capsule TAKE ONE CAPSULE BY MOUTH TWICE DAILY 07/23/17   Charlott Rakes, MD  glucose blood (ACCU-CHEK AVIVA PLUS) test strip TEST three times a day. E11.9 11/16/17   Charlott Rakes, MD  Insulin Glargine (LANTUS SOLOSTAR) 100 UNIT/ML Solostar Pen Inject 48 Units into the skin 2 (two) times daily. 11/07/17   Argentina Donovan, PA-C  Insulin Pen Needle 31G X 8 MM MISC Use as directed for 4 times daily insulin administration. 11/07/16   Tresa Garter, MD  Insulin Syringe-Needle U-100 (BD INSULIN SYRINGE ULTRAFINE) 31G X 15/64" 0.5 ML MISC Use as directed 08/11/15   Angelica Chessman E, MD  ipratropium (ATROVENT) 0.03 % nasal spray Place 2 sprays into both nostrils 2 (two) times daily. 03/27/17   Robyn Haber, MD  ketoconazole (NIZORAL) 2 % cream Apply 1 application topically 2 (two) times daily. 03/27/17   Robyn Haber, MD  liraglutide (VICTOZA) 18 MG/3ML SOPN Inject 0.3 mLs (1.8 mg total) into the skin daily. 09/06/17   Charlott Rakes, MD  lisinopril (PRINIVIL,ZESTRIL) 5 MG tablet Take 1 tablet (5 mg total) by mouth daily. 06/19/17   Charlott Rakes, MD  metFORMIN (GLUCOPHAGE-XR) 500 MG 24 hr tablet TAKE TWO TABLETS BY MOUTH TWICE DAILY 10/18/17   Charlott Rakes, MD  methocarbamol (ROBAXIN) 500 MG tablet Take 2 tablets (1,000 mg total) by mouth 3 (three) times daily. X 10 days then prn muscle spasm 11/07/17   [name] M, PA-C  metoCLOPramide  (REGLAN) 5 MG tablet TAKE ONE TABLET BY MOUTH EVERY DAY 06/07/17   Pyrtle, Lajuan Lines, MD  NEEDLE, DISP, 24 G (B-D DISP NEEDLE 24GX1") 24G X 1" MISC Use with solostar pen to inject 4 times daily 07/09/14   Tresa Garter, MD  [facility] powder APPLY TOPICALLY TO THE AFFECTED AREA(S) TWICE DAILY 07/26/17   Charlott Rakes, MD  pantoprazole (PROTONIX) 40 MG tablet Take 1 tablet (40 mg total) by mouth daily. 06/19/17   Charlott Rakes, MD  triamcinolone cream (KENALOG) 0.1 % Apply 1 application topically 2 (two) times daily. 01/18/17   Argentina Donovan, PA-C  Vitamin D, Ergocalciferol, (DRISDOL) 50000 units CAPS capsule Take 1 capsule (50,000 Units total) by mouth every 7 (seven) days. 06/21/17   Charlott Rakes, MD  zolpidem (AMBIEN) 5 MG tablet Take 5 mg by mouth at bedtime.    [provider]    Family History Family History  Problem Relation Age of Onset  . Diabetes Mother   . Stroke Mother   . Hypertension Mother   . Breast cancer Sister   . Heart attack Sister   . Other Daughter        died at age 51  . Cirrhosis Father   . Colon cancer Maternal Uncle     Social History Social History   Tobacco Use  . Smoking status: Current Some Day Smoker    Packs/day: 0.25    Types: Cigarettes    Last attempt to quit: 08/01/2016    Years since quitting: 1.4  . Smokeless tobacco: Never Used  Substance Use Topics  . Alcohol use: No    Alcohol/week: 0.0 standard drinks  . Drug use: No     Allergies   Patient has no known allergies.   Review of Systems Review of Systems  All other systems reviewed and are negative.    Physical Exam Triage Vital Signs ED Triage Vitals  Enc Vitals Group     BP 01/04/18 1133 (!) 148/86     Pulse Rate 01/04/18 1133 92     Resp 01/04/18 1133 18     Temp 01/04/18 1133 97.8 F (36.6 C)     Temp Source 01/04/18 1133 Oral     SpO2 01/04/18 1133 96 %     Weight --      Height --      Pain Score 01/04/18 1134 5     Pain Loc --    Updated Vital  Signs BP (!) 148/86 (BP Location: Right Arm)   Pulse 92   Temp 97.8 F (36.6 C) (Oral)   Resp 18   LMP 01/23/2014 (Approximate) Comment: no cycle since this date  SpO2 96%  Physical Exam Vitals signs and nursing note reviewed.  Constitutional:      General: She is not in acute distress.    Comments: Alert, nicely groomed  HENT:     Head: Atraumatic.  Eyes:     Comments: Conjugate gaze, no eye redness/drainage  Neck:     Musculoskeletal: Neck supple.  Cardiovascular:     Rate and Rhythm: Normal rate.  Pulmonary:     Effort: No respiratory distress.  Abdominal:     General: There is no distension.  Genitourinary:    Comments: Extensive erythema and excoriation over the entire mons, inner thighs, gluteal cleft, labia. Musculoskeletal: Normal range of motion.     Comments: No leg swelling  Skin:    General: Skin is warm and dry.     Comments: No cyanosis  Neurological:     Mental Status: She is alert and oriented to person, place, and time.      Final Clinical Impressions(s) / UC Diagnoses   Final diagnoses:  Vaginitis and vulvovaginitis     Discharge Instructions     Exam today suggests severe yeast infection, but hard to completely rule out herpes virus (fever blister).  Anticipate gradual improvement in itching/irritation over the next several days; may take 2-3 weeks to resolve.  If symptoms  do not improve as expected, would recheck and consider a skin condition like psoriasis also. Prescriptions for fluconazole (for yeast) and famciclovir (for possible herpes virus) were sent to the pharmacy.      ED Prescriptions    Medication Sig Dispense Auth. Provider   fluconazole (DIFLUCAN) 200 MG tablet Take 1 tablet (200 mg total) by mouth daily for 7 days. 7 tablet Wynona Luna, MD   famciclovir [facility]) 500 MG tablet Take 1 tablet (500 mg total) by mouth 3 (three) times daily for 7 days. 21 tablet Wynona Luna, MD        Wynona Luna,  MD 01/06/18 [phone]

## 2018-01-04 NOTE — ED Triage Notes (Signed)
Pt sts vaginal itching and irritation

## 2018-01-04 NOTE — Progress Notes (Signed)
Pt requesting Victoza. Has recent history of cancellations/no-shows. Will give one month supply at this time. Spoke to her and informed her that she must keep next month's appointment to get additional refills.

## 2018-01-04 NOTE — Discharge Instructions (Signed)
Exam today suggests severe yeast infection, but hard to completely rule out herpes virus (fever blister).  Anticipate gradual improvement in itching/irritation over the next several days; may take 2-3 weeks to resolve.  If symptoms do not improve as expected, would recheck and consider a skin condition like psoriasis also. Prescriptions for fluconazole (for yeast) and famciclovir (for possible herpes virus) were sent to the pharmacy.

## 2018-01-04 NOTE — Telephone Encounter (Signed)
Patient called requesting to speak [facility] regarding her DM. Please f/u with pt.

## 2018-01-10 ENCOUNTER — Telehealth: Payer: Self-pay | Admitting: Family Medicine

## 2018-01-10 NOTE — Telephone Encounter (Signed)
Medicaid needs her on a different meter and stirps

## 2018-01-12 NOTE — Telephone Encounter (Signed)
The patient has RX's for the medicaid preferred meter/strips/lancets. I will have to call and speak with the patient on Monday to see what is going on.

## 2018-01-14 NOTE — Telephone Encounter (Signed)
Patient was notified by her pharmacy that she will need to switch to the Accu-Chek Guide system shortly. She believed medicaid would no longer pay for her current system. I let her know medicaid still pays for her system but in the not so distant future that system will be discontinued and at that time we will switch her.

## 2018-01-23 ENCOUNTER — Encounter (HOSPITAL_COMMUNITY): Payer: Self-pay

## 2018-01-23 ENCOUNTER — Ambulatory Visit (HOSPITAL_COMMUNITY)
Admission: EM | Admit: 2018-01-23 | Discharge: 2018-01-23 | Disposition: A | Discharge status: Home / Self Care | Payer: Medicaid Other | Attending: Family Medicine | Admitting: Family Medicine

## 2018-01-23 DIAGNOSIS — L02214 Cutaneous abscess of groin: Secondary | ICD-10-CM | POA: Diagnosis present

## 2018-01-23 DIAGNOSIS — J019 Acute sinusitis, unspecified: Secondary | ICD-10-CM | POA: Diagnosis present

## 2018-01-23 MED ORDER — AMOXICILLIN-POT CLAVULANATE 875-125 MG PO TABS: 1.0000 | ORAL_TABLET | Freq: Two times a day (BID) | ORAL | 0 refills | Status: AC

## 2018-01-23 MED ORDER — FLUTICASONE PROPIONATE 50 MCG/ACT NA SUSP: 1.0000 | Freq: Every day | NASAL | 0 refills | Status: DC

## 2018-01-23 NOTE — Discharge Instructions (Signed)
Please begin Augmentin twice daily for 10 days to treat abscess and sinus infection Please also use Flonase nasal spray 1 to 2 sprays in each nostril to help with nasal congestion Apply warm compresses to groin to help soften up abscesses Please return in 3 to 4 days if abscesses not improving with oral antibiotics as they may warrant drainage Please continue to monitor for resolution of your sinus symptoms and follow-up if symptoms worsening, developing fever, increased pressure, shortness of breath, difficulty breathing

## 2018-01-23 NOTE — ED Provider Notes (Signed)
[place]    CSN: [id number] Arrival date & time: 01/23/18  1014     History   Chief Complaint Chief Complaint  Patient presents with  . Sinus Issues  . Abscess    Vaginal Area    HPI Tara Keller is a 53 y.o. female history of DM type II, tobacco use, OSA presenting today for evaluation of URI symptoms and abscess.  Patient states that she has had sinus pressure, congestion and rhinorrhea.  Symptoms have been going on for over a week.  She has had associated headache and slight ear discomfort.  Cough is nonproductive.  Denies chest pain or shortness of breath.  She has not taken over-the-counter medicines as she is concerned about it affecting her diabetes.  Denies fevers.  Patient also has had abscess develop to her right groin over the past few days.  Denies any drainage.  She has noticed some abnormal discharge that is of brownish discoloration.  She denies any itching or irritation.  Denies history of abscesses.  She has not applied anything to this area.  Denies changing soaps or changing hygiene products recently.  HPI  Past Medical History:  Diagnosis Date  . Anxiety   . Arthritis   . Asthma   . Depression   . Diabetes mellitus   . Fatty liver   . Fracture closed of upper end of forearm 9 years, Fx left left leg  . Fracture of left lower leg @ 53 years old  . H/O tubal ligation   . Hiatal hernia   . Hypertension   . Obesity   . Positive H. pylori test   . Sleep apnea    uses CPAP  . Tubular adenoma of colon     Patient Active Problem List   Diagnosis Date Noted  . Lumbar radiculopathy 04/13/2017  . Back pain 10/18/2016  . Diabetic neuropathy ([place]) 09/15/2016  . Arthritis 09/15/2016  . Hx of adenomatous colonic polyps 08/04/2016  . OSA on CPAP 07/06/2015  . Depression (em[place]) 07/06/2015  . Abnormal barium swallow   . Abdominal pain, epigastric 08/15/2013  . Benign neoplasm of colon 08/15/2013  . Special screening for malignant neoplasms,  colon 08/15/2013  . Hepatic steatosis 05/19/2013  . History of pancreatitis 05/19/2013  . Family history of colon cancer 05/19/2013  . GERD (gastroesophageal reflux disease) 02/18/2013  . Smoking 02/18/2013  . Morbid obesity ([place]) 05/28/2012  . Type 2 diabetes mellitus ([place]) 05/28/2012  . Osteoarthritis of left and right knee 05/28/2012  . Generalized anxiety disorder 05/28/2012    Past Surgical History:  Procedure Laterality Date  . CHOLECYSTECTOMY    . COLONOSCOPY WITH PROPOFOL N/A 08/15/2013   Procedure: COLONOSCOPY WITH PROPOFOL;  Surgeon: Jerene Bears, MD;  Location: [facility];  Service: Gastroenterology;  Laterality: N/A;  . ESOPHAGOGASTRODUODENOSCOPY N/A 05/12/2014   Procedure: ESOPHAGOGASTRODUODENOSCOPY (EGD);  Surgeon: Jerene Bears, MD;  Location: [facility];  Service: Gastroenterology;  Laterality: N/A;  . ESOPHAGOGASTRODUODENOSCOPY (EGD) WITH PROPOFOL N/A 08/15/2013   Procedure: ESOPHAGOGASTRODUODENOSCOPY (EGD) WITH PROPOFOL;  Surgeon: Jerene Bears, MD;  Location: [facility];  Service: Gastroenterology;  Laterality: N/A;  . TUBAL LIGATION Bilateral     OB History    Gravida  3   Para  3   Term  2   Preterm  1   AB      Living  4     SAB      TAB      Ectopic  Multiple  1   Live Births               Home Medications    Prior to Admission medications   Medication Sig Start Date End Date Taking? Authorizing Provider  ACCU-CHEK FASTCLIX LANCETS MISC Use as directed 09/06/17   Charlott Rakes, MD  albuterol (PROVENTIL HFA;VENTOLIN HFA) 108 (90 Base) MCG/ACT inhaler Inhale 1-2 puffs into the lungs every 6 (six) hours as needed for wheezing or shortness of breath. 06/16/87   Delora Fuel, MD  amoxicillin-clavulanate (AUGMENTIN) 875-125 MG tablet Take 1 tablet by mouth every 12 (twelve) hours for 10 days. 01/23/18 02/02/18  Filippa Yarbough C, PA-C  aspirin EC 81 MG tablet Take 1 tablet (81 mg total) by mouth daily. 04/25/16   Maren Reamer, MD    atorvastatin (LIPITOR) 20 MG tablet Take 1 tablet (20 mg total) by mouth daily. 06/19/17   Charlott Rakes, MD  Blood Glucose Monitoring Suppl (ACCU-CHEK AVIVA PLUS) w/Device KIT 1 each by Does not apply route 3 (three) times daily. 08/11/16   Argentina Donovan, PA-C  buPROPion (WELLBUTRIN XL) 150 MG 24 hr tablet Take 3 tablets (450 mg total) daily by mouth. 11/23/16   Charlott Rakes, MD  cetirizine (ZYRTEC) 10 MG tablet TAKE ONE TABLET (10 MG) BY MOUTH EVERY DAY 10/18/17   Charlott Rakes, MD  clonazePAM (KLONOPIN) 1 MG tablet Take 1 mg by mouth at bedtime.    [provider]  diclofenac (VOLTAREN) 75 MG EC tablet Take 1 tablet (75 mg total) by mouth 2 (two) times daily. X 10 days then prn pain 11/07/17   [name] M, PA-C  fluticasone [facility]) 50 MCG/ACT nasal spray Place 1-2 sprays into both nostrils daily for 7 days. 01/23/18 01/30/18  Hadrian Yarbrough C, PA-C  gabapentin (NEURONTIN) 300 MG capsule TAKE ONE CAPSULE BY MOUTH TWICE DAILY 07/23/17   Charlott Rakes, MD  glucose blood (ACCU-CHEK AVIVA PLUS) test strip TEST three times a day. E11.9 01/04/18   Charlott Rakes, MD  Insulin Glargine (LANTUS SOLOSTAR) 100 UNIT/ML Solostar Pen Inject 48 Units into the skin 2 (two) times daily. 11/07/17   Argentina Donovan, PA-C  Insulin Pen Needle 31G X 8 MM MISC Use as directed for 4 times daily insulin administration. 11/07/16   Tresa Garter, MD  Insulin Syringe-Needle U-100 (BD INSULIN SYRINGE ULTRAFINE) 31G X 15/64" 0.5 ML MISC Use as directed 08/11/15   Angelica Chessman E, MD  ipratropium (ATROVENT) 0.03 % nasal spray Place 2 sprays into both nostrils 2 (two) times daily. 03/27/17   Robyn Haber, MD  ketoconazole (NIZORAL) 2 % cream Apply 1 application topically 2 (two) times daily. 03/27/17   Robyn Haber, MD  liraglutide (VICTOZA) 18 MG/3ML SOPN Inject 0.3 mLs (1.8 mg total) into the skin daily. 01/04/18   Charlott Rakes, MD  lisinopril (PRINIVIL,ZESTRIL) 5 MG tablet Take 1  tablet (5 mg total) by mouth daily. 06/19/17   Charlott Rakes, MD  metFORMIN (GLUCOPHAGE-XR) 500 MG 24 hr tablet TAKE TWO TABLETS BY MOUTH TWICE DAILY 10/18/17   Charlott Rakes, MD  methocarbamol (ROBAXIN) 500 MG tablet Take 2 tablets (1,000 mg total) by mouth 3 (three) times daily. X 10 days then prn muscle spasm 11/07/17   [name] M, PA-C  metoCLOPramide (REGLAN) 5 MG tablet TAKE ONE TABLET BY MOUTH EVERY DAY 06/07/17   Pyrtle, Lajuan Lines, MD  NEEDLE, DISP, 24 G (B-D DISP NEEDLE 24GX1") 24G X 1" MISC Use with solostar pen to inject  4 times daily 07/09/14   Tresa Garter, MD  [facility] powder APPLY TOPICALLY TO THE AFFECTED AREA(S) TWICE DAILY 07/26/17   Charlott Rakes, MD  pantoprazole (PROTONIX) 40 MG tablet Take 1 tablet (40 mg total) by mouth daily. 06/19/17   Charlott Rakes, MD  triamcinolone cream (KENALOG) 0.1 % Apply 1 application topically 2 (two) times daily. 01/18/17   Argentina Donovan, PA-C  Vitamin D, Ergocalciferol, (DRISDOL) 50000 units CAPS capsule Take 1 capsule (50,000 Units total) by mouth every 7 (seven) days. 06/21/17   Charlott Rakes, MD  zolpidem (AMBIEN) 5 MG tablet Take 5 mg by mouth at bedtime.    [provider]    Family History Family History  Problem Relation Age of Onset  . Diabetes Mother   . Stroke Mother   . Hypertension Mother   . Breast cancer Sister   . Heart attack Sister   . Other Daughter        died at age 74  . Cirrhosis Father   . Colon cancer Maternal Uncle     Social History Social History   Tobacco Use  . Smoking status: Current Some Day Smoker    Packs/day: 0.25    Types: Cigarettes    Last attempt to quit: 08/01/2016    Years since quitting: 1.4  . Smokeless tobacco: Never Used  Substance Use Topics  . Alcohol use: No    Alcohol/week: 0.0 standard drinks  . Drug use: No     Allergies   Patient has no known allergies.   Review of Systems Review of Systems  Constitutional: Negative for activity change,  appetite change, chills, fatigue and fever.  HENT: Positive for congestion, rhinorrhea, sinus pressure and sore throat. Negative for ear pain and trouble swallowing.   Eyes: Negative for discharge and redness.  Respiratory: Positive for cough. Negative for chest tightness and shortness of breath.   Cardiovascular: Negative for chest pain.  Gastrointestinal: Negative for abdominal pain, diarrhea, nausea and vomiting.  Genitourinary: Positive for genital sores and vaginal discharge. Negative for dysuria, flank pain and frequency.  Musculoskeletal: Negative for myalgias.  Skin: Negative for rash.  Neurological: Positive for headaches. Negative for dizziness and light-headedness.     Physical Exam Triage Vital Signs ED Triage Vitals  Enc Vitals Group     BP 01/23/18 1123 137/84     Pulse Rate 01/23/18 1123 99     Resp 01/23/18 1123 18     Temp 01/23/18 1123 98.3 F (36.8 C)     Temp Source 01/23/18 1123 Oral     SpO2 01/23/18 1123 99 %     Weight --      Height --      Head Circumference --      Peak Flow --      Pain Score 01/23/18 1129 7     Pain Loc --      Pain Edu? --      Excl. in [place]? --    No data found.  Updated Vital Signs BP 137/84 (BP Location: Left Arm)   Pulse 99   Temp 98.3 F (36.8 C) (Oral)   Resp 18   LMP 01/23/2014 (Approximate) Comment: no cycle since this date  SpO2 99%   Visual Acuity Right Eye Distance:   Left Eye Distance:   Bilateral Distance:    Right Eye Near:   Left Eye Near:    Bilateral Near:     Physical Exam Vitals signs and nursing note  reviewed.  Constitutional:      General: She is not in acute distress.    Appearance: She is well-developed.     Comments: Obese  HENT:     Head: Normocephalic and atraumatic.     Ears:     Comments: Bilateral ears without tenderness to palpation of external auricle, tragus and mastoid, EAC's without erythema or swelling, TM's with good bony landmarks and cone of light. Non erythematous.     Nose:     Comments: Nasal mucosa erythematous    Mouth/Throat:     Comments: Oral mucosa pink and moist, no tonsillar enlargement or exudate. Posterior pharynx patent and nonerythematous, no uvula deviation or swelling. Normal phonation. Eyes:     Conjunctiva/sclera: Conjunctivae normal.  Neck:     Musculoskeletal: Neck supple.  Cardiovascular:     Rate and Rhythm: Normal rate and regular rhythm.     Heart sounds: No murmur.  Pulmonary:     Effort: Pulmonary effort is normal. No respiratory distress.     Breath sounds: Normal breath sounds.     Comments: Breathing comfortably at rest, CTABL, no wheezing, rales or other adventitious sounds auscultated Abdominal:     Palpations: Abdomen is soft.     Tenderness: There is no abdominal tenderness.  Genitourinary:    Comments: 2 areas of approximately 2 cm with induration and erythema and tenderness to right groin, no fluctuance palpated  No other lesions noted to labia/vulva, 1 area that appears to have skin breakdown to left lower labia majora  Vaginal mucosa appears slightly dry, whitish-brownish discharge present Skin:    General: Skin is warm and dry.  Neurological:     Mental Status: She is alert.      UC Treatments / Results  Labs (all labs ordered are listed, but only abnormal results are displayed) Labs Reviewed - No data to display  EKG None  Radiology No results found.  Procedures Procedures (including critical care time)  Medications Ordered in UC Medications - No data to display  Initial Impression / Assessment and Plan / UC Course  I have reviewed the triage vital signs and the nursing notes.  Pertinent labs & imaging results that were available during my care of the patient were reviewed by me and considered in my medical decision making (see chart for details).    Will treat patient for sinusitis with Augmentin as well as to cover for groin abscesses.  Warm compresses to abscesses.  Do not appear to  warrant drainage at this time, will have patient return in 3 to 4 days if not having any improvement or if abscess is worsening for drainage at that time.  Flonase as well to help with congestion.  Continue to monitor symptoms,Discussed strict return precautions. Patient verbalized understanding and is agreeable with plan.  Final Clinical Impressions(s) / UC Diagnoses   Final diagnoses:  Acute sinusitis with symptoms > 10 days  Abscess of groin, right     Discharge Instructions     Please begin Augmentin twice daily for 10 days to treat abscess and sinus infection Please also use Flonase nasal spray 1 to 2 sprays in each nostril to help with nasal congestion Apply warm compresses to groin to help soften up abscesses Please return in 3 to 4 days if abscesses not improving with oral antibiotics as they may warrant drainage Please continue to monitor for resolution of your sinus symptoms and follow-up if symptoms worsening, developing fever, increased pressure, shortness of breath, difficulty  breathing   ED Prescriptions    Medication Sig Dispense Auth. Provider   amoxicillin-clavulanate (AUGMENTIN) 875-125 MG tablet Take 1 tablet by mouth every 12 (twelve) hours for 10 days. 20 tablet Landy Dunnavant C, PA-C   fluticasone (FLONASE) 50 MCG/ACT nasal spray Place 1-2 sprays into both nostrils daily for 7 days. 1 g Staysha Truby, [place] C, PA-C     Controlled Substance Prescriptions [place] Controlled Substance Registry consulted? Not Applicable   Janith Lima, [place] 01/23/18 1158

## 2018-01-23 NOTE — ED Triage Notes (Signed)
Pt presents with sinus issues; congestion, nasal drainage, facial pain/pressure and headache.  Pt also complains of 2 abscess on vaginal area that she says itch and hurt. Pt was previously treated for genital herpes on last visit.

## 2018-01-24 LAB — CERVICOVAGINAL ANCILLARY ONLY
Bacterial vaginitis: NEGATIVE
Candida vaginitis: POSITIVE — AB
Chlamydia: NEGATIVE
Neisseria Gonorrhea: NEGATIVE
TRICH (WINDOWPATH): NEGATIVE

## 2018-01-25 ENCOUNTER — Telehealth: Payer: Self-pay | Admitting: Emergency Medicine

## 2018-01-25 MED ORDER — FLUCONAZOLE 150 MG PO TABS: 150.0000 mg | ORAL_TABLET | Freq: Once | ORAL | 0 refills | Status: AC

## 2018-01-25 NOTE — Telephone Encounter (Signed)
Positive for candida, will cal patient to inform, Diflucan sent to [facility] [address]

## 2018-02-06 ENCOUNTER — Encounter: Payer: Self-pay | Admitting: Family Medicine

## 2018-02-06 ENCOUNTER — Ambulatory Visit: Payer: Medicaid Other | Attending: Family Medicine | Admitting: Family Medicine

## 2018-02-06 VITALS — BP 125/85 | HR 95 | Temp 97.5°F | Ht 66.0 in | Wt 309.4 lb

## 2018-02-06 DIAGNOSIS — Z9119 Patient's noncompliance with other medical treatment and regimen: Secondary | ICD-10-CM | POA: Insufficient documentation

## 2018-02-06 DIAGNOSIS — E1169 Type 2 diabetes mellitus with other specified complication: Secondary | ICD-10-CM

## 2018-02-06 DIAGNOSIS — M5441 Lumbago with sciatica, right side: Secondary | ICD-10-CM | POA: Insufficient documentation

## 2018-02-06 DIAGNOSIS — Z7982 Long term (current) use of aspirin: Secondary | ICD-10-CM | POA: Diagnosis not present

## 2018-02-06 DIAGNOSIS — E1165 Type 2 diabetes mellitus with hyperglycemia: Secondary | ICD-10-CM | POA: Diagnosis not present

## 2018-02-06 DIAGNOSIS — K3184 Gastroparesis: Secondary | ICD-10-CM | POA: Insufficient documentation

## 2018-02-06 DIAGNOSIS — K219 Gastro-esophageal reflux disease without esophagitis: Secondary | ICD-10-CM | POA: Diagnosis not present

## 2018-02-06 DIAGNOSIS — R21 Rash and other nonspecific skin eruption: Secondary | ICD-10-CM | POA: Insufficient documentation

## 2018-02-06 DIAGNOSIS — Z6841 Body Mass Index (BMI) 40.0 and over, adult: Secondary | ICD-10-CM

## 2018-02-06 DIAGNOSIS — Z79899 Other long term (current) drug therapy: Secondary | ICD-10-CM | POA: Insufficient documentation

## 2018-02-06 DIAGNOSIS — K449 Diaphragmatic hernia without obstruction or gangrene: Secondary | ICD-10-CM | POA: Diagnosis not present

## 2018-02-06 DIAGNOSIS — F32A Depression, unspecified: Secondary | ICD-10-CM

## 2018-02-06 DIAGNOSIS — G8929 Other chronic pain: Secondary | ICD-10-CM

## 2018-02-06 DIAGNOSIS — F329 Major depressive disorder, single episode, unspecified: Secondary | ICD-10-CM | POA: Diagnosis not present

## 2018-02-06 DIAGNOSIS — E1143 Type 2 diabetes mellitus with diabetic autonomic (poly)neuropathy: Secondary | ICD-10-CM | POA: Diagnosis not present

## 2018-02-06 DIAGNOSIS — F419 Anxiety disorder, unspecified: Secondary | ICD-10-CM | POA: Diagnosis not present

## 2018-02-06 DIAGNOSIS — R102 Pelvic and perineal pain: Secondary | ICD-10-CM | POA: Diagnosis not present

## 2018-02-06 DIAGNOSIS — B373 Candidiasis of vulva and vagina: Secondary | ICD-10-CM

## 2018-02-06 DIAGNOSIS — G4730 Sleep apnea, unspecified: Secondary | ICD-10-CM | POA: Insufficient documentation

## 2018-02-06 DIAGNOSIS — B3731 Acute candidiasis of vulva and vagina: Secondary | ICD-10-CM

## 2018-02-06 DIAGNOSIS — Z794 Long term (current) use of insulin: Secondary | ICD-10-CM | POA: Diagnosis not present

## 2018-02-06 DIAGNOSIS — I10 Essential (primary) hypertension: Secondary | ICD-10-CM | POA: Diagnosis not present

## 2018-02-06 DIAGNOSIS — M170 Bilateral primary osteoarthritis of knee: Secondary | ICD-10-CM | POA: Insufficient documentation

## 2018-02-06 DIAGNOSIS — J45909 Unspecified asthma, uncomplicated: Secondary | ICD-10-CM | POA: Diagnosis not present

## 2018-02-06 LAB — POCT GLYCOSYLATED HEMOGLOBIN (HGB A1C): HbA1c, POC (controlled diabetic range): 14.3 % — AB (ref 0.0–7.0)

## 2018-02-06 LAB — GLUCOSE, POCT (MANUAL RESULT ENTRY)
POC Glucose: 461 mg/dl — AB (ref 70–99)
POC Glucose: 440 mg/dl — AB (ref 70–99)

## 2018-02-06 MED ORDER — INSULIN GLARGINE 100 UNIT/ML SOLOSTAR PEN: 48.0000 [IU] | PEN_INJECTOR | Freq: Two times a day (BID) | SUBCUTANEOUS | 3 refills | Status: DC

## 2018-02-06 MED ORDER — LIRAGLUTIDE 18 MG/3ML ~~LOC~~ SOPN: 1.8000 mg | PEN_INJECTOR | Freq: Every day | SUBCUTANEOUS | 3 refills | Status: DC

## 2018-02-06 MED ORDER — PANTOPRAZOLE SODIUM 40 MG PO TBEC: 40.0000 mg | DELAYED_RELEASE_TABLET | Freq: Every day | ORAL | 3 refills | Status: DC

## 2018-02-06 MED ORDER — METFORMIN HCL ER 500 MG PO TB24: 1000.0000 mg | ORAL_TABLET | Freq: Two times a day (BID) | ORAL | 3 refills | Status: DC

## 2018-02-06 MED ORDER — DICLOFENAC SODIUM 75 MG PO TBEC: 75.0000 mg | DELAYED_RELEASE_TABLET | Freq: Two times a day (BID) | ORAL | 1 refills | Status: DC

## 2018-02-06 MED ORDER — GLUCOSE BLOOD VI STRP: ORAL_STRIP | 12 refills | Status: DC

## 2018-02-06 MED ORDER — ATORVASTATIN CALCIUM 20 MG PO TABS: 20.0000 mg | ORAL_TABLET | Freq: Every day | ORAL | 1 refills | Status: DC

## 2018-02-06 MED ORDER — LISINOPRIL 5 MG PO TABS: 5.0000 mg | ORAL_TABLET | Freq: Every day | ORAL | 1 refills | Status: DC

## 2018-02-06 MED ORDER — ACCU-CHEK FASTCLIX LANCETS MISC: 12 refills | Status: DC

## 2018-02-06 MED ORDER — ACCU-CHEK GUIDE ME W/DEVICE KIT: 48.0000 [IU] | PACK | Freq: Two times a day (BID) | 0 refills | Status: AC

## 2018-02-06 MED ORDER — FLUCONAZOLE 150 MG PO TABS: 150.0000 mg | ORAL_TABLET | Freq: Every day | ORAL | 0 refills | Status: DC

## 2018-02-06 MED ORDER — INSULIN ASPART 100 UNIT/ML ~~LOC~~ SOLN
15.0000 [IU] | Freq: Once | SUBCUTANEOUS | Status: AC
  Administered 2018-02-06: 15 [IU] via SUBCUTANEOUS

## 2018-02-06 NOTE — Progress Notes (Signed)
Patient has stopped taking insulin.  Patient states that she is having vaginal itching and burning.

## 2018-02-06 NOTE — Patient Instructions (Signed)
Diabetes Mellitus and Nutrition, Adult  When you have diabetes (diabetes mellitus), it is very important to have healthy eating habits because your blood sugar (glucose) levels are greatly affected by what you eat and drink. Eating healthy foods in the appropriate amounts, at about the same times every day, can help you:  · Control your blood glucose.  · Lower your risk of heart disease.  · Improve your blood pressure.  · Reach or maintain a healthy weight.  Every person with diabetes is different, and each person has different needs for a meal plan. Your health care provider may recommend that you work with a diet and nutrition specialist (dietitian) to make a meal plan that is best for you. Your meal plan may vary depending on factors such as:  · The calories you need.  · The medicines you take.  · Your weight.  · Your blood glucose, blood pressure, and cholesterol levels.  · Your activity level.  · Other health conditions you have, such as heart or kidney disease.  How do carbohydrates affect me?  Carbohydrates, also called carbs, affect your blood glucose level more than any other type of food. Eating carbs naturally raises the amount of glucose in your blood. Carb counting is a method for keeping track of how many carbs you eat. Counting carbs is important to keep your blood glucose at a healthy level, especially if you use insulin or take certain oral diabetes medicines.  It is important to know how many carbs you can safely have in each meal. This is different for every person. Your dietitian can help you calculate how many carbs you should have at each meal and for each snack.  Foods that contain carbs include:  · Bread, cereal, rice, pasta, and crackers.  · Potatoes and corn.  · Peas, beans, and lentils.  · Milk and yogurt.  · Fruit and juice.  · Desserts, such as cakes, cookies, ice cream, and candy.  How does alcohol affect me?  Alcohol can cause a sudden decrease in blood glucose (hypoglycemia),  especially if you use insulin or take certain oral diabetes medicines. Hypoglycemia can be a life-threatening condition. Symptoms of hypoglycemia (sleepiness, dizziness, and confusion) are similar to symptoms of having too much alcohol.  If your health care provider says that alcohol is safe for you, follow these guidelines:  · Limit alcohol intake to no more than 1 drink per day for nonpregnant women and 2 drinks per day for men. One drink equals 12 oz of beer, 5 oz of wine, or 1½ oz of hard liquor.  · Do not drink on an empty stomach.  · Keep yourself hydrated with water, diet soda, or unsweetened iced tea.  · Keep in mind that regular soda, juice, and other mixers may contain a lot of sugar and must be counted as carbs.  What are tips for following this plan?    Reading food labels  · Start by checking the serving size on the "Nutrition Facts" label of packaged foods and drinks. The amount of calories, carbs, fats, and other nutrients listed on the label is based on one serving of the item. Many items contain more than one serving per package.  · Check the total grams (g) of carbs in one serving. You can calculate the number of servings of carbs in one serving by dividing the total carbs by 15. For example, if a food has 30 g of total carbs, it would be equal to 2   servings of carbs.  · Check the number of grams (g) of saturated and trans fats in one serving. Choose foods that have low or no amount of these fats.  · Check the number of milligrams (mg) of salt (sodium) in one serving. Most people should limit total sodium intake to less than 2,300 mg per day.  · Always check the nutrition information of foods labeled as "low-fat" or "nonfat". These foods may be higher in added sugar or refined carbs and should be avoided.  · Talk to your dietitian to identify your daily goals for nutrients listed on the label.  Shopping  · Avoid buying canned, premade, or processed foods. These foods tend to be high in fat, sodium,  and added sugar.  · Shop around the outside edge of the grocery store. This includes fresh fruits and vegetables, bulk grains, fresh meats, and fresh dairy.  Cooking  · Use low-heat cooking methods, such as baking, instead of high-heat cooking methods like deep frying.  · Cook using healthy oils, such as olive, canola, or sunflower oil.  · Avoid cooking with butter, cream, or high-fat meats.  Meal planning  · Eat meals and snacks regularly, preferably at the same times every day. Avoid going long periods of time without eating.  · Eat foods high in fiber, such as fresh fruits, vegetables, beans, and whole grains. Talk to your dietitian about how many servings of carbs you can eat at each meal.  · Eat 4-6 ounces (oz) of lean protein each day, such as lean meat, chicken, fish, eggs, or tofu. One oz of lean protein is equal to:  ? 1 oz of meat, chicken, or fish.  ? 1 egg.  ? ¼ cup of tofu.  · Eat some foods each day that contain healthy fats, such as avocado, nuts, seeds, and fish.  Lifestyle  · Check your blood glucose regularly.  · Exercise regularly as told by your health care provider. This may include:  ? 150 minutes of moderate-intensity or vigorous-intensity exercise each week. This could be brisk walking, biking, or water aerobics.  ? Stretching and doing strength exercises, such as yoga or weightlifting, at least 2 times a week.  · Take medicines as told by your health care provider.  · Do not use any products that contain nicotine or tobacco, such as cigarettes and e-cigarettes. If you need help quitting, ask your health care provider.  · Work with a counselor or diabetes educator to identify strategies to manage stress and any emotional and social challenges.  Questions to ask a health care provider  · Do I need to meet with a diabetes educator?  · Do I need to meet with a dietitian?  · What number can I call if I have questions?  · When are the best times to check my blood glucose?  Where to find more  information:  · American Diabetes Association: diabetes.org  · Academy of Nutrition and Dietetics: www.eatright.org  · National Institute of Diabetes and Digestive and Kidney Diseases (NIH): www.niddk.nih.gov  Summary  · A healthy meal plan will help you control your blood glucose and maintain a healthy lifestyle.  · Working with a diet and nutrition specialist (dietitian) can help you make a meal plan that is best for you.  · Keep in mind that carbohydrates (carbs) and alcohol have immediate effects on your blood glucose levels. It is important to count carbs and to use alcohol carefully.  This information is not intended to   replace advice given to you by your health care provider. Make sure you discuss any questions you have with your health care provider.  Document Released: 09/22/2004 Document Revised: 07/26/2016 Document Reviewed: 01/31/2016  Elsevier Interactive Patient Education © 2019 [employer]

## 2018-02-06 NOTE — Progress Notes (Signed)
Subjective:  Patient ID: Tara Keller, female    DOB: 1965/10/22  Age: 53 y.o. MRN: [id number]  CC: Diabetes   HPI Tara Keller is a 53 year old female with a history of uncontrolled type 2 diabetes mellitus (A1c 14.3 ), diabetic neuropathy, GERD, gastroparesis, depression and anxiety who presents today for a follow-up visit. She has been out of all her medications and her blood sugar in the clinic is 161.  NovoLog 15 units administered. She relocated and states it was unsafe to return to her previous residence hence she could not go to pick up her medications. She has not been compliant with a diabetic diet or exercise. She complains of a pruritic rash in her perineum she describes as burning and itching and received 1 Diflucan pill which did not help her symptoms.  She also was treated for a boil in her thigh and completed a course of antibiotics but the bump is still present.  Her depression is managed by [facility] when she sees a psychiatrist and is prescribed Wellbutrin and Klonopin. She does have chronic low back pain and bilateral knee osteoarthritis for which I had referred her to pain management but she never made it there.  Past Medical History:  Diagnosis Date  . Anxiety   . Arthritis   . Asthma   . Depression   . Diabetes mellitus   . Fatty liver   . Fracture closed of upper end of forearm 9 years, Fx left left leg  . Fracture of left lower leg @ 54 years old  . H/O tubal ligation   . Hiatal hernia   . Hypertension   . Obesity   . Positive H. pylori test   . Sleep apnea    uses CPAP  . Tubular adenoma of colon     Past Surgical History:  Procedure Laterality Date  . CHOLECYSTECTOMY    . COLONOSCOPY WITH PROPOFOL N/A 08/15/2013   Procedure: COLONOSCOPY WITH PROPOFOL;  Surgeon: Jerene Bears, MD;  Location: [facility];  Service: Gastroenterology;  Laterality: N/A;  . ESOPHAGOGASTRODUODENOSCOPY N/A 05/12/2014   Procedure: ESOPHAGOGASTRODUODENOSCOPY (EGD);  Surgeon:  Jerene Bears, MD;  Location: [facility];  Service: Gastroenterology;  Laterality: N/A;  . ESOPHAGOGASTRODUODENOSCOPY (EGD) WITH PROPOFOL N/A 08/15/2013   Procedure: ESOPHAGOGASTRODUODENOSCOPY (EGD) WITH PROPOFOL;  Surgeon: Jerene Bears, MD;  Location: [facility];  Service: Gastroenterology;  Laterality: N/A;  . TUBAL LIGATION Bilateral     No Known Allergies   Outpatient Medications Prior to Visit  Medication Sig Dispense Refill  . albuterol (PROVENTIL HFA;VENTOLIN HFA) 108 (90 Base) MCG/ACT inhaler Inhale 1-2 puffs into the lungs every 6 (six) hours as needed for wheezing or shortness of breath. 1 Inhaler 0  . aspirin EC 81 MG tablet Take 1 tablet (81 mg total) by mouth daily. 90 tablet 3  . cetirizine (ZYRTEC) 10 MG tablet TAKE ONE TABLET (10 MG) BY MOUTH EVERY DAY 30 tablet 2  . clonazePAM (KLONOPIN) 1 MG tablet Take 1 mg by mouth at bedtime.    . gabapentin (NEURONTIN) 300 MG capsule TAKE ONE CAPSULE BY MOUTH TWICE DAILY 60 capsule 2  . Insulin Pen Needle 31G X 8 MM MISC Use as directed for 4 times daily insulin administration. 100 each 12  . Insulin Syringe-Needle U-100 (BD INSULIN SYRINGE ULTRAFINE) 31G X 15/64" 0.5 ML MISC Use as directed 100 each 5  . ipratropium (ATROVENT) 0.03 % nasal spray Place 2 sprays into both nostrils 2 (two) times daily. [place]  mL 0  . ketoconazole (NIZORAL) 2 % cream Apply 1 application topically 2 (two) times daily. 60 g 0  . methocarbamol (ROBAXIN) 500 MG tablet Take 2 tablets (1,000 mg total) by mouth 3 (three) times daily. X 10 days then prn muscle spasm 120 tablet 3  . metoCLOPramide (REGLAN) 5 MG tablet TAKE ONE TABLET BY MOUTH EVERY DAY 30 tablet 2  . NEEDLE, DISP, 24 G (B-D DISP NEEDLE 24GX1") 24G X 1" MISC Use with solostar pen to inject 4 times daily 200 each 1  . NYAMYC powder APPLY TOPICALLY TO THE AFFECTED AREA(S) TWICE DAILY 45 g 0  . triamcinolone cream (KENALOG) 0.1 % Apply 1 application topically 2 (two) times daily. 45 g 1  . Vitamin D,  Ergocalciferol, (DRISDOL) 50000 units CAPS capsule Take 1 capsule (50,000 Units total) by mouth every 7 (seven) days. 16 capsule 0  . zolpidem (AMBIEN) 5 MG tablet Take 5 mg by mouth at bedtime.    [name] ACCU-CHEK FASTCLIX LANCETS MISC Use as directed 100 each 12  . atorvastatin (LIPITOR) 20 MG tablet Take 1 tablet (20 mg total) by mouth daily. 90 tablet 1  . Blood Glucose Monitoring Suppl (ACCU-CHEK AVIVA PLUS) w/Device KIT 1 each by Does not apply route 3 (three) times daily. 1 kit 0  . buPROPion (WELLBUTRIN XL) 150 MG 24 hr tablet Take 3 tablets (450 mg total) daily by mouth. 30 tablet 0  . diclofenac (VOLTAREN) 75 MG EC tablet Take 1 tablet (75 mg total) by mouth 2 (two) times daily. X 10 days then prn pain 60 tablet 0  . glucose blood (ACCU-CHEK AVIVA PLUS) test strip TEST three times a day. E11.9 100 each 11  . Insulin Glargine (LANTUS SOLOSTAR) 100 UNIT/ML Solostar Pen Inject 48 Units into the skin 2 (two) times daily. 10 pen PRN  . liraglutide (VICTOZA) 18 MG/3ML SOPN Inject 0.3 mLs (1.8 mg total) into the skin daily. 9 mL 0  . lisinopril (PRINIVIL,ZESTRIL) 5 MG tablet Take 1 tablet (5 mg total) by mouth daily. 90 tablet 1  . metFORMIN (GLUCOPHAGE-XR) 500 MG 24 hr tablet TAKE TWO TABLETS BY MOUTH TWICE DAILY 120 tablet 2  . pantoprazole (PROTONIX) 40 MG tablet Take 1 tablet (40 mg total) by mouth daily. 30 tablet 6  . fluticasone (FLONASE) 50 MCG/ACT nasal spray Place 1-2 sprays into both nostrils daily for 7 days. 1 g 0   No facility-administered medications prior to visit.     ROS Review of Systems  Constitutional: Negative for activity change, appetite change and fatigue.  HENT: Negative for congestion, sinus pressure and sore throat.   Eyes: Negative for visual disturbance.  Respiratory: Negative for cough, chest tightness, shortness of breath and wheezing.   Cardiovascular: Negative for chest pain and palpitations.  Gastrointestinal: Negative for abdominal distention, abdominal pain  and constipation.  Endocrine: Negative for polydipsia.  Genitourinary: Positive for vaginal pain. Negative for dysuria and frequency.  Musculoskeletal:       See hpi  Skin: Negative for rash.  Neurological: Negative for tremors, light-headedness and numbness.  Hematological: Does not bruise/bleed easily.  Psychiatric/Behavioral: Negative for agitation and behavioral problems.    Objective:  BP 125/85   Pulse 95   Temp (!) 97.5 F (36.4 C) (Oral)   Ht 5' 6"  (1.676 m)   Wt (!) 309 lb 6.4 oz (140.3 kg)   LMP 01/23/2014 (Approximate) Comment: no cycle since this date  SpO2 97%   BMI 49.94 kg/m   BP/Weight  02/06/2018 01/23/2018 02/40/9735  Systolic BP 329 924 268  Diastolic BP 85 84 86  Wt. (Lbs) 309.4 - -  BMI 49.94 - -      Physical Exam Constitutional:      Appearance: She is well-developed.  Cardiovascular:     Rate and Rhythm: Normal rate.     Heart sounds: Normal heart sounds. No murmur.  Pulmonary:     Effort: Pulmonary effort is normal.     Breath sounds: Normal breath sounds. No wheezing or rales.  Chest:     Chest wall: No tenderness.  Abdominal:     General: Bowel sounds are normal. There is no distension.     Palpations: Abdomen is soft. There is no mass.     Tenderness: There is no abdominal tenderness.  Genitourinary:    Comments: Whitish rash in perineum Slightly tender mobile lump in posteromedial aspect of right thigh Musculoskeletal: Normal range of motion.  Neurological:     Mental Status: She is alert and oriented to person, place, and time.     Lab Results  Component Value Date   HGBA1C 14.3 (A) 02/06/2018    Assessment & Plan:   1. Type 2 diabetes mellitus with other specified complication, with long-term current use of insulin (HCC) Uncontrolled with A1c of 14.3 due to noncompliance NovoLog 15 units administered due to hypoglycemia in clinic and blood sugar repeated after 30 minutes She has been out of all her medications which have  been refilled and compliance has been emphasized Diabetic diet, lifestyle modifications - POCT glucose (manual entry) - POCT glycosylated hemoglobin (Hb A1C) - insulin aspart (novoLOG) injection 15 Units - ACCU-CHEK FASTCLIX LANCETS MISC; Use as directed  Dispense: 100 each; Refill: 12 - atorvastatin (LIPITOR) 20 MG tablet; Take 1 tablet (20 mg total) by mouth daily.  Dispense: 90 tablet; Refill: 1 - Insulin Glargine (LANTUS SOLOSTAR) 100 UNIT/ML Solostar Pen; Inject 48 Units into the skin 2 (two) times daily.  Dispense: 9 pen; Refill: 3 - lisinopril (PRINIVIL,ZESTRIL) 5 MG tablet; Take 1 tablet (5 mg total) by mouth daily.  Dispense: 90 tablet; Refill: 1 - Blood Glucose Monitoring Suppl (ACCU-CHEK GUIDE ME) w/Device KIT; 48 Units by Does not apply route 2 (two) times daily.  Dispense: 1 kit; Refill: 0 - glucose blood (ACCU-CHEK GUIDE) test strip; Use as instructed  Dispense: 100 each; Refill: 12 - POCT glucose (manual entry)  2. Type 2 diabetes mellitus with other specified complication, without long-term current use of insulin (HCC) See #1 above - metFORMIN (GLUCOPHAGE-XR) 500 MG 24 hr tablet; Take 2 tablets (1,000 mg total) by mouth 2 (two) times daily.  Dispense: 120 tablet; Refill: 3  3. Gastroesophageal reflux disease without esophagitis Stable - pantoprazole (PROTONIX) 40 MG tablet; Take 1 tablet (40 mg total) by mouth daily.  Dispense: 30 tablet; Refill: 3  4. Vaginal candidiasis Uncontrolled Exacerbated by hyperglycemia - fluconazole (DIFLUCAN) 150 MG tablet; Take 1 tablet (150 mg total) by mouth daily.  Dispense: 7 tablet; Refill: 0  5. Depression, unspecified depression type Management as per mental health Currently on Wellbutrin 150 mg twice daily and Klonopin by psych  6. Morbid obesity ([place]) We will need to lose weight Discussed lifestyle modifications, increasing physical activity, reducing portion sizes  7. Chronic midline low back pain with right-sided  sciatica Referred to [facility] last year and she never made it there We have provided her with the number to reschedule her appointment - diclofenac (VOLTAREN) 75 MG EC tablet;  Take 1 tablet (75 mg total) by mouth 2 (two) times daily.  Dispense: 60 tablet; Refill: 1   Meds ordered this encounter  Medications  . insulin aspart (novoLOG) injection 15 Units  . ACCU-CHEK FASTCLIX LANCETS MISC    Sig: Use as directed    Dispense:  100 each    Refill:  12  . atorvastatin (LIPITOR) 20 MG tablet    Sig: Take 1 tablet (20 mg total) by mouth daily.    Dispense:  90 tablet    Refill:  1  . Insulin Glargine (LANTUS SOLOSTAR) 100 UNIT/ML Solostar Pen    Sig: Inject 48 Units into the skin 2 (two) times daily.    Dispense:  9 pen    Refill:  3  . liraglutide (VICTOZA) 18 MG/3ML SOPN    Sig: Inject 0.3 mLs (1.8 mg total) into the skin daily.    Dispense:  9 mL    Refill:  3  . lisinopril (PRINIVIL,ZESTRIL) 5 MG tablet    Sig: Take 1 tablet (5 mg total) by mouth daily.    Dispense:  90 tablet    Refill:  1  . metFORMIN (GLUCOPHAGE-XR) 500 MG 24 hr tablet    Sig: Take 2 tablets (1,000 mg total) by mouth 2 (two) times daily.    Dispense:  120 tablet    Refill:  3  . pantoprazole (PROTONIX) 40 MG tablet    Sig: Take 1 tablet (40 mg total) by mouth daily.    Dispense:  30 tablet    Refill:  3  . fluconazole (DIFLUCAN) 150 MG tablet    Sig: Take 1 tablet (150 mg total) by mouth daily.    Dispense:  7 tablet    Refill:  0  . Blood Glucose Monitoring Suppl (ACCU-CHEK GUIDE ME) w/Device KIT    Sig: 48 Units by Does not apply route 2 (two) times daily.    Dispense:  1 kit    Refill:  0  . glucose blood (ACCU-CHEK GUIDE) test strip    Sig: Use as instructed    Dispense:  100 each    Refill:  12  . diclofenac (VOLTAREN) 75 MG EC tablet    Sig: Take 1 tablet (75 mg total) by mouth 2 (two) times daily.    Dispense:  60 tablet    Refill:  1    Follow-up: Return in about 1 month  (around 03/09/2018) for Coordination of care.   Charlott Rakes MD

## 2018-03-05 ENCOUNTER — Telehealth: Payer: Self-pay | Admitting: Family Medicine

## 2018-03-05 NOTE — Telephone Encounter (Signed)
Patient called because she states that she is having a burning/itching sensation in her private areas. She would like to be prescribed something for the issue. Please follow up.

## 2018-03-05 NOTE — Telephone Encounter (Signed)
Patient called and stated the she has a vaginal burning and itching sensation.  Patient states that the previous medication she was prescribed fluconazole and ketoconazole had worked.   Patient advised that a message would be sent to Dr. Margarita Rana.  Patient would be a.dvised about what course of action we would take.  Patient called back and advised to make an appointment.  Patient transferred to front desk.

## 2018-03-06 NOTE — Telephone Encounter (Signed)
It looks like she has an appointment for 03/08/2018 and I would advise her to keep this.  Her glycemic control will also need to be evaluated in the light of recurring vaginal infections given she had received a 7-day course of fluconazole last month.

## 2018-03-08 ENCOUNTER — Encounter: Payer: Self-pay | Admitting: Internal Medicine

## 2018-03-08 ENCOUNTER — Ambulatory Visit: Payer: Medicaid Other | Attending: Internal Medicine | Admitting: Internal Medicine

## 2018-03-08 ENCOUNTER — Other Ambulatory Visit (HOSPITAL_COMMUNITY)
Admission: RE | Admit: 2018-03-08 | Discharge: 2018-03-08 | Disposition: A | Discharge status: Home / Self Care | Payer: Medicaid Other | Source: Ambulatory Visit | Attending: Internal Medicine | Admitting: Internal Medicine

## 2018-03-08 VITALS — BP 121/84 | HR 96 | Temp 98.2°F | Resp 16 | Wt 310.4 lb

## 2018-03-08 DIAGNOSIS — K219 Gastro-esophageal reflux disease without esophagitis: Secondary | ICD-10-CM | POA: Diagnosis not present

## 2018-03-08 DIAGNOSIS — E1165 Type 2 diabetes mellitus with hyperglycemia: Secondary | ICD-10-CM | POA: Insufficient documentation

## 2018-03-08 DIAGNOSIS — N898 Other specified noninflammatory disorders of vagina: Secondary | ICD-10-CM | POA: Diagnosis present

## 2018-03-08 DIAGNOSIS — M5416 Radiculopathy, lumbar region: Secondary | ICD-10-CM | POA: Diagnosis not present

## 2018-03-08 DIAGNOSIS — Z86010 Personal history of colonic polyps: Secondary | ICD-10-CM | POA: Insufficient documentation

## 2018-03-08 DIAGNOSIS — N761 Subacute and chronic vaginitis: Secondary | ICD-10-CM | POA: Insufficient documentation

## 2018-03-08 DIAGNOSIS — Z6841 Body Mass Index (BMI) 40.0 and over, adult: Secondary | ICD-10-CM | POA: Insufficient documentation

## 2018-03-08 DIAGNOSIS — F329 Major depressive disorder, single episode, unspecified: Secondary | ICD-10-CM | POA: Diagnosis not present

## 2018-03-08 DIAGNOSIS — Z7982 Long term (current) use of aspirin: Secondary | ICD-10-CM | POA: Insufficient documentation

## 2018-03-08 DIAGNOSIS — E1143 Type 2 diabetes mellitus with diabetic autonomic (poly)neuropathy: Secondary | ICD-10-CM | POA: Insufficient documentation

## 2018-03-08 DIAGNOSIS — Z79899 Other long term (current) drug therapy: Secondary | ICD-10-CM | POA: Diagnosis not present

## 2018-03-08 DIAGNOSIS — Z800 Family history of malignant neoplasm of digestive organs: Secondary | ICD-10-CM | POA: Diagnosis not present

## 2018-03-08 DIAGNOSIS — R300 Dysuria: Secondary | ICD-10-CM | POA: Insufficient documentation

## 2018-03-08 DIAGNOSIS — K760 Fatty (change of) liver, not elsewhere classified: Secondary | ICD-10-CM | POA: Insufficient documentation

## 2018-03-08 DIAGNOSIS — F411 Generalized anxiety disorder: Secondary | ICD-10-CM | POA: Diagnosis not present

## 2018-03-08 DIAGNOSIS — E1149 Type 2 diabetes mellitus with other diabetic neurological complication: Secondary | ICD-10-CM

## 2018-03-08 DIAGNOSIS — Z791 Long term (current) use of non-steroidal anti-inflammatories (NSAID): Secondary | ICD-10-CM | POA: Diagnosis not present

## 2018-03-08 DIAGNOSIS — Z794 Long term (current) use of insulin: Secondary | ICD-10-CM | POA: Insufficient documentation

## 2018-03-08 DIAGNOSIS — G4733 Obstructive sleep apnea (adult) (pediatric): Secondary | ICD-10-CM | POA: Insufficient documentation

## 2018-03-08 DIAGNOSIS — K3184 Gastroparesis: Secondary | ICD-10-CM | POA: Insufficient documentation

## 2018-03-08 LAB — POCT URINALYSIS DIP (CLINITEK)
Bilirubin, UA: NEGATIVE
Glucose, UA: 500 mg/dL — AB
Ketones, POC UA: NEGATIVE mg/dL
Nitrite, UA: NEGATIVE
POC PROTEIN,UA: NEGATIVE
SPEC GRAV UA: 1.01 (ref 1.010–1.025)
Urobilinogen, UA: 0.2 E.U./dL
pH, UA: 7 (ref 5.0–8.0)

## 2018-03-08 MED ORDER — FLUCONAZOLE 150 MG PO TABS: 150.0000 mg | ORAL_TABLET | Freq: Once | ORAL | 0 refills | Status: AC

## 2018-03-08 MED ORDER — GABAPENTIN 300 MG PO CAPS: 300.0000 mg | ORAL_CAPSULE | Freq: Two times a day (BID) | ORAL | 2 refills | Status: DC

## 2018-03-08 NOTE — Progress Notes (Signed)
Pt states when she urinates it burns  Pt states she has some itchiness

## 2018-03-08 NOTE — Progress Notes (Signed)
Patient ID: Tara Keller, female    DOB: 04-May-1965  MRN: [id number]  CC:  Vaginal disch Subjective:  Tara Keller is a 53 y.o. female who presents for UC visit. Her concerns today include:  uncontrolled type 2 diabetes mellitus (A1c 14.3 ), diabetic neuropathy, GERD, gastroparesis, depression and anxiety   Patient complains of vaginal dischg off and on x 2-3 mths Tan in color.  + itching and burning.  Endorses burning with urination.  No hematuria Same sex partner x 8 yrs.   Diagnosed with Candida vaginitis last month and was treated.  Complains of numbness in the lower legs and feet.  She has known history of diabetic neuropathy.  Supposed to be on gabapentin but states she has not taken it in a while because she did not find it helpful.  Last A1c was 14.  Patient states that her blood sugars are improving.  A.m in 130, before dinner 200-300.  Reports compliance with her diabetes medications. Patient Active Problem List   Diagnosis Date Noted  . Lumbar radiculopathy 04/13/2017  . Back pain 10/18/2016  . Diabetic neuropathy ([place]) 09/15/2016  . Arthritis 09/15/2016  . Hx of adenomatous colonic polyps 08/04/2016  . OSA on CPAP 07/06/2015  . Depression 07/06/2015  . Abnormal barium swallow   . Abdominal pain, epigastric 08/15/2013  . Benign neoplasm of colon 08/15/2013  . Special screening for malignant neoplasms, colon 08/15/2013  . Hepatic steatosis 05/19/2013  . History of pancreatitis 05/19/2013  . Family history of colon cancer 05/19/2013  . GERD (gastroesophageal reflux disease) 02/18/2013  . Smoking 02/18/2013  . Morbid obesity ([place]) 05/28/2012  . Type 2 diabetes mellitus ([place]) 05/28/2012  . Osteoarthritis of left and right knee 05/28/2012  . Generalized anxiety disorder 05/28/2012     Current Outpatient Medications on File Prior to Visit  Medication Sig Dispense Refill  . ACCU-CHEK FASTCLIX LANCETS MISC Use as directed 100 each 12  . albuterol (PROVENTIL HFA;VENTOLIN  HFA) 108 (90 Base) MCG/ACT inhaler Inhale 1-2 puffs into the lungs every 6 (six) hours as needed for wheezing or shortness of breath. 1 Inhaler 0  . aspirin EC 81 MG tablet Take 1 tablet (81 mg total) by mouth daily. 90 tablet 3  . atorvastatin (LIPITOR) 20 MG tablet Take 1 tablet (20 mg total) by mouth daily. 90 tablet 1  . Blood Glucose Monitoring Suppl (ACCU-CHEK GUIDE ME) w/Device KIT 48 Units by Does not apply route 2 (two) times daily. 1 kit 0  . cetirizine (ZYRTEC) 10 MG tablet TAKE ONE TABLET (10 MG) BY MOUTH EVERY DAY 30 tablet 2  . clonazePAM (KLONOPIN) 1 MG tablet Take 1 mg by mouth at bedtime.    . diclofenac (VOLTAREN) 75 MG EC tablet Take 1 tablet (75 mg total) by mouth 2 (two) times daily. 60 tablet 1  . fluconazole (DIFLUCAN) 150 MG tablet Take 1 tablet (150 mg total) by mouth daily. (Patient not taking: Reported on 03/08/2018) 7 tablet 0  . fluticasone (FLONASE) 50 MCG/ACT nasal spray Place 1-2 sprays into both nostrils daily for 7 days. 1 g 0  . gabapentin (NEURONTIN) 300 MG capsule TAKE ONE CAPSULE BY MOUTH TWICE DAILY 60 capsule 2  . glucose blood (ACCU-CHEK GUIDE) test strip Use as instructed 100 each 12  . Insulin Glargine (LANTUS SOLOSTAR) 100 UNIT/ML Solostar Pen Inject 48 Units into the skin 2 (two) times daily. 9 pen 3  . Insulin Pen Needle 31G X 8 MM MISC Use as directed for  4 times daily insulin administration. 100 each 12  . Insulin Syringe-Needle U-100 (BD INSULIN SYRINGE ULTRAFINE) 31G X 15/64" 0.5 ML MISC Use as directed 100 each 5  . ipratropium (ATROVENT) 0.03 % nasal spray Place 2 sprays into both nostrils 2 (two) times daily. 30 mL 0  . ketoconazole (NIZORAL) 2 % cream Apply 1 application topically 2 (two) times daily. (Patient not taking: Reported on 03/08/2018) 60 g 0  . liraglutide (VICTOZA) 18 MG/3ML SOPN Inject 0.3 mLs (1.8 mg total) into the skin daily. 9 mL 3  . lisinopril (PRINIVIL,ZESTRIL) 5 MG tablet Take 1 tablet (5 mg total) by mouth daily. 90 tablet 1    . metFORMIN (GLUCOPHAGE-XR) 500 MG 24 hr tablet Take 2 tablets (1,000 mg total) by mouth 2 (two) times daily. 120 tablet 3  . methocarbamol (ROBAXIN) 500 MG tablet Take 2 tablets (1,000 mg total) by mouth 3 (three) times daily. X 10 days then prn muscle spasm 120 tablet 3  . metoCLOPramide (REGLAN) 5 MG tablet TAKE ONE TABLET BY MOUTH EVERY DAY 30 tablet 2  . NEEDLE, DISP, 24 G (B-D DISP NEEDLE 24GX1") 24G X 1" MISC Use with solostar pen to inject 4 times daily 200 each 1  . NYAMYC powder APPLY TOPICALLY TO THE AFFECTED AREA(S) TWICE DAILY 45 g 0  . pantoprazole (PROTONIX) 40 MG tablet Take 1 tablet (40 mg total) by mouth daily. 30 tablet 3  . triamcinolone cream (KENALOG) 0.1 % Apply 1 application topically 2 (two) times daily. 45 g 1  . Vitamin D, Ergocalciferol, (DRISDOL) 50000 units CAPS capsule Take 1 capsule (50,000 Units total) by mouth every 7 (seven) days. 16 capsule 0  . zolpidem (AMBIEN) 5 MG tablet Take 5 mg by mouth at bedtime.     No current facility-administered medications on file prior to visit.     No Known Allergies   ROS: Review of Systems Negative except as above.  PHYSICAL EXAM: BP 121/84   Pulse 96   Temp 98.2 F (36.8 C) (Oral)   Resp 16   Wt (!) 310 lb 6.4 oz (140.8 kg)   LMP 01/23/2014 (Approximate) Comment: no cycle since this date  SpO2 98%   BMI 50.10 kg/m   Physical Exam  General appearance - alert, well appearing, older African-American female and in no distress Mental status - normal mood, behavior, speech, dress, motor activity, and thought processes Results for orders placed or performed in visit on 03/08/18  POCT URINALYSIS DIP (CLINITEK)  Result Value Ref Range   Color, UA yellow yellow   Clarity, UA clear clear   Glucose, UA =500 (A) negative mg/dL   Bilirubin, UA negative negative   Ketones, POC UA negative negative mg/dL   Spec Grav, UA 1.010 1.010 - 1.025   Blood, UA moderate (A) negative   pH, UA 7.0 5.0 - 8.0   POC PROTEIN,UA  negative negative, trace   Urobilinogen, UA 0.2 0.2 or 1.0 E.U./dL   Nitrite, UA Negative Negative   Leukocytes, UA Trace (A) Negative    ASSESSMENT AND PLAN: 1. Chronic vaginitis Likely yeast. 1 dose of Diflucan given. Stressed the importance of good diabetes control  - Cervicovaginal ancillary only  2. Dysuria - POCT URINALYSIS DIP (CLINITEK) - Urine Culture  3. Other diabetic neurological complication associated with type 2 diabetes mellitus ([place]) Recommend restarting gabapentin.  Patient informed that the dose can be titrated up to try to get an optimal response.  Stressed the importance of good diabetes  control. - gabapentin (NEURONTIN) 300 MG capsule; Take 1 capsule (300 mg total) by mouth 2 (two) times daily.  Dispense: 60 capsule; Refill: 2   Patient was given the opportunity to ask questions.  Patient verbalized understanding of the plan and was able to repeat key elements of the plan.   Orders Placed This Encounter  Procedures  . POCT URINALYSIS DIP (CLINITEK)     Requested Prescriptions    No prescriptions requested or ordered in this encounter    Future Appointments  Date Time Provider [place]  03/21/2018 11:10 AM Charlott Rakes, MD [id number] None    Karle Plumber, MD, [facility]

## 2018-03-08 NOTE — Patient Instructions (Signed)
Take the Diflucan as prescribed. I recommend restarting your gabapentin for the neuropathy symptoms in your legs and feet.

## 2018-03-11 ENCOUNTER — Other Ambulatory Visit: Payer: Self-pay | Admitting: Internal Medicine

## 2018-03-11 ENCOUNTER — Telehealth: Payer: Self-pay

## 2018-03-11 LAB — URINE CULTURE

## 2018-03-11 MED ORDER — AMOXICILLIN 500 MG PO CAPS: 500.0000 mg | ORAL_CAPSULE | Freq: Three times a day (TID) | ORAL | 0 refills | Status: DC

## 2018-03-11 NOTE — Telephone Encounter (Signed)
Contacted pt and left a detailed vm informing pt of results and if he has any questions or concerns to give me a call

## 2018-03-12 LAB — CERVICOVAGINAL ANCILLARY ONLY
Bacterial vaginitis: NEGATIVE
Candida vaginitis: POSITIVE — AB
Chlamydia: NEGATIVE
Neisseria Gonorrhea: NEGATIVE
Trichomonas: NEGATIVE

## 2018-03-20 ENCOUNTER — Telehealth: Payer: Self-pay

## 2018-03-20 NOTE — Telephone Encounter (Signed)
Contacted pt to go over urine results pt is aware and doesn't have any questions or concerns

## 2018-03-21 ENCOUNTER — Ambulatory Visit: Payer: Medicaid Other | Admitting: Family Medicine

## 2018-03-27 ENCOUNTER — Ambulatory Visit: Payer: Medicaid Other | Admitting: Family Medicine

## 2018-04-01 ENCOUNTER — Encounter: Payer: Self-pay | Admitting: *Deleted

## 2018-04-01 ENCOUNTER — Telehealth: Payer: Self-pay | Admitting: Obstetrics & Gynecology

## 2018-04-01 NOTE — Telephone Encounter (Signed)
Called the patient to inform of the corona virus restrictions. The patient stated she would like to have the virtual visit to discuss concerns.

## 2018-04-02 ENCOUNTER — Telehealth (INDEPENDENT_AMBULATORY_CARE_PROVIDER_SITE_OTHER): Payer: Medicaid Other | Admitting: Obstetrics & Gynecology

## 2018-04-02 ENCOUNTER — Other Ambulatory Visit: Payer: Self-pay

## 2018-04-02 DIAGNOSIS — B379 Candidiasis, unspecified: Secondary | ICD-10-CM | POA: Diagnosis not present

## 2018-04-02 DIAGNOSIS — E119 Type 2 diabetes mellitus without complications: Secondary | ICD-10-CM | POA: Diagnosis not present

## 2018-04-02 MED ORDER — FLUCONAZOLE 150 MG PO TABS: ORAL_TABLET | ORAL | 5 refills | Status: DC

## 2018-04-02 NOTE — Progress Notes (Signed)
TELEHEALTH VIRTUAL GYNECOLOGY VISIT ENCOUNTER NOTE  I connected with Tara Keller on 04/02/18 at  9:15 AM EDT by telephone and verified that I am speaking with the correct person using two identifiers.   I discussed the limitations, risks, security and privacy concerns of performing an evaluation and management service by telephone and the availability of in person appointments. I also discussed with the patient that there may be a patient responsible charge related to this service. The patient expressed understanding and agreed to proceed.   History:  Tara Keller is a 53 y.o. single [id number] (55 yo deceased, twin 55 yo s and 41 yo son, 75 grands) female being evaluated today for recurrent vaginal itching and infections. She has been treated for yeast. She doesn't know if she has been treated for anything else except for her diabetes. She says that she was treated with an antibiotic. She says that it will go away and then come back. She says that her sugars generally run in the 200-400s.  Her wet preps only show yeast each time that they are done.   She is currently taking amoxacillin for an unknown reason.   Past Medical History:  Diagnosis Date  . Anxiety   . Arthritis   . Asthma   . Depression   . Diabetes mellitus   . Fatty liver   . Fracture closed of upper end of forearm 9 years, Fx left left leg  . Fracture of left lower leg @ 53 years old  . H/O tubal ligation   . Hiatal hernia   . Hypertension   . Obesity   . Positive H. pylori test   . Sleep apnea    uses CPAP  . Tubular adenoma of colon    Past Surgical History:  Procedure Laterality Date  . CHOLECYSTECTOMY    . COLONOSCOPY WITH PROPOFOL N/A 08/15/2013   Procedure: COLONOSCOPY WITH PROPOFOL;  Surgeon: Jerene Bears, MD;  Location: [facility];  Service: Gastroenterology;  Laterality: N/A;  . ESOPHAGOGASTRODUODENOSCOPY N/A 05/12/2014   Procedure: ESOPHAGOGASTRODUODENOSCOPY (EGD);  Surgeon: Jerene Bears, MD;  Location: [facility];  Service: Gastroenterology;  Laterality: N/A;  . ESOPHAGOGASTRODUODENOSCOPY (EGD) WITH PROPOFOL N/A 08/15/2013   Procedure: ESOPHAGOGASTRODUODENOSCOPY (EGD) WITH PROPOFOL;  Surgeon: Jerene Bears, MD;  Location: [facility];  Service: Gastroenterology;  Laterality: N/A;  . TUBAL LIGATION Bilateral    The following portions of the patient's history were reviewed and updated as appropriate: allergies, current medications, past family history, past medical history, past social history, past surgical history and problem list.   Health Maintenance:  Normal pap and negative HRHPV on 4/18  Normal mammogram on 7/19.  Review of Systems:  Pertinent items noted in HPI and remainder of comprehensive ROS otherwise negative.  Physical Exam:  Physical exam deferred due to nature of the encounter  Labs and Imaging Results for orders placed or performed in visit on 03/08/18 (from the past 672 hour(s))  Cervicovaginal ancillary only   Collection Time: 03/08/18 12:00 AM  Result Value Ref Range   Bacterial vaginitis Negative for Bacterial Vaginitis Microorganisms    Candida vaginitis **POSITIVE for Candida species** (A)    Chlamydia Negative    Neisseria gonorrhea Negative    Trichomonas Negative   POCT URINALYSIS DIP (CLINITEK)   Collection Time: 03/08/18 10:24 AM  Result Value Ref Range   Color, UA yellow yellow   Clarity, UA clear clear   Glucose, UA =500 (A) negative mg/dL   Bilirubin,  UA negative negative   Ketones, POC UA negative negative mg/dL   Spec Grav, UA 1.010 1.010 - 1.025   Blood, UA moderate (A) negative   pH, UA 7.0 5.0 - 8.0   POC PROTEIN,UA negative negative, trace   Urobilinogen, UA 0.2 0.2 or 1.0 E.U./dL   Nitrite, UA Negative Negative   Leukocytes, UA Trace (A) Negative  Urine Culture   Collection Time: 03/08/18 11:55 AM  Result Value Ref Range   Urine Culture, Routine Final report (A)    Organism ID, Bacteria Comment (A)    ORGANISM ID, BACTERIA Comment     No results found.    Assessment and Plan:     Recurrent yeast infection with poorly controlled DM. I have rec'd 1 diflucan every Sunday. I have also explained that she will probably never be entirely cured until she controls her sugars.  No follow-ups on file.     I discussed the assessment and treatment plan with the patient. The patient was provided an opportunity to ask questions and all were answered. The patient agreed with the plan and demonstrated an understanding of the instructions.   The patient was advised to call back or seek an in-person evaluation/go to the ED if the symptoms worsen or if the condition fails to improve as anticipated.  I provided 10 minutes of non-face-to-face time during this encounter.   Emily Filbert, MD [facility], [place]

## 2018-07-10 DIAGNOSIS — D259 Leiomyoma of uterus, unspecified: Secondary | ICD-10-CM

## 2018-07-10 HISTORY — DX: Leiomyoma of uterus, unspecified: D25.9

## 2018-07-17 ENCOUNTER — Encounter: Payer: Self-pay | Admitting: Internal Medicine

## 2018-07-18 ENCOUNTER — Emergency Department (HOSPITAL_COMMUNITY): Payer: Medicaid Other

## 2018-07-18 ENCOUNTER — Other Ambulatory Visit: Payer: Self-pay

## 2018-07-18 ENCOUNTER — Encounter (HOSPITAL_COMMUNITY): Payer: Self-pay

## 2018-07-18 ENCOUNTER — Emergency Department (HOSPITAL_COMMUNITY)
Admission: EM | Admit: 2018-07-18 | Discharge: 2018-07-18 | Disposition: A | Discharge status: Home / Self Care | Payer: Medicaid Other | Attending: Emergency Medicine | Admitting: Emergency Medicine

## 2018-07-18 DIAGNOSIS — I10 Essential (primary) hypertension: Secondary | ICD-10-CM | POA: Insufficient documentation

## 2018-07-18 DIAGNOSIS — F17210 Nicotine dependence, cigarettes, uncomplicated: Secondary | ICD-10-CM | POA: Insufficient documentation

## 2018-07-18 DIAGNOSIS — R9389 Abnormal findings on diagnostic imaging of other specified body structures: Secondary | ICD-10-CM | POA: Diagnosis not present

## 2018-07-18 DIAGNOSIS — E1165 Type 2 diabetes mellitus with hyperglycemia: Secondary | ICD-10-CM | POA: Diagnosis not present

## 2018-07-18 DIAGNOSIS — Z79899 Other long term (current) drug therapy: Secondary | ICD-10-CM | POA: Insufficient documentation

## 2018-07-18 DIAGNOSIS — J45909 Unspecified asthma, uncomplicated: Secondary | ICD-10-CM | POA: Diagnosis not present

## 2018-07-18 DIAGNOSIS — Z794 Long term (current) use of insulin: Secondary | ICD-10-CM | POA: Diagnosis not present

## 2018-07-18 DIAGNOSIS — N939 Abnormal uterine and vaginal bleeding, unspecified: Secondary | ICD-10-CM

## 2018-07-18 DIAGNOSIS — Z7982 Long term (current) use of aspirin: Secondary | ICD-10-CM | POA: Diagnosis not present

## 2018-07-18 DIAGNOSIS — R739 Hyperglycemia, unspecified: Secondary | ICD-10-CM

## 2018-07-18 LAB — LIPASE, BLOOD: Lipase: 69 U/L — ABNORMAL HIGH (ref 11–51)

## 2018-07-18 LAB — COMPREHENSIVE METABOLIC PANEL
ALT: 33 U/L (ref 0–44)
AST: 19 U/L (ref 15–41)
Albumin: 3.6 g/dL (ref 3.5–5.0)
Alkaline Phosphatase: 162 U/L — ABNORMAL HIGH (ref 38–126)
Anion gap: 14 (ref 5–15)
BUN: 8 mg/dL (ref 6–20)
CO2: 26 mmol/L (ref 22–32)
Calcium: 9.2 mg/dL (ref 8.9–10.3)
Chloride: 93 mmol/L — ABNORMAL LOW (ref 98–111)
Creatinine, Ser: 0.67 mg/dL (ref 0.44–1.00)
GFR calc Af Amer: >60 mL/min (ref >60)
GFR calc non Af Amer: >60 mL/min (ref >60)
Glucose, Bld: 412 mg/dL — ABNORMAL HIGH (ref 70–99)
Potassium: 3.4 mmol/L — ABNORMAL LOW (ref 3.5–5.1)
Sodium: 133 mmol/L — ABNORMAL LOW (ref 135–145)
Total Bilirubin: 0.4 mg/dL (ref 0.3–1.2)
Total Protein: 7.6 g/dL (ref 6.5–8.1)

## 2018-07-18 LAB — CBC WITH DIFFERENTIAL/PLATELET
Abs Immature Granulocytes: 0.02 10*3/uL (ref 0.00–0.07)
Basophils Absolute: 0 10*3/uL (ref 0.0–0.1)
Basophils Relative: 1 %
Eosinophils Absolute: 0.2 10*3/uL (ref 0.0–0.5)
Eosinophils Relative: 2 %
HCT: 43.2 % (ref 36.0–46.0)
Hemoglobin: 14 g/dL (ref 12.0–15.0)
Immature Granulocytes: 0 %
Lymphocytes Relative: 38 %
Lymphs Abs: 3 10*3/uL (ref 0.7–4.0)
MCH: 27.9 pg (ref 26.0–34.0)
MCHC: 32.4 g/dL (ref 30.0–36.0)
MCV: 86.2 fL (ref 80.0–100.0)
Monocytes Absolute: 0.5 10*3/uL (ref 0.1–1.0)
Monocytes Relative: 7 %
Neutro Abs: 4.2 10*3/uL (ref 1.7–7.7)
Neutrophils Relative %: 52 %
Platelets: 262 10*3/uL (ref 150–400)
RBC: 5.01 MIL/uL (ref 3.87–5.11)
RDW: 13.1 % (ref 11.5–15.5)
WBC: 7.9 10*3/uL (ref 4.0–10.5)
nRBC: 0 % (ref 0.0–0.2)

## 2018-07-18 LAB — URINALYSIS, ROUTINE W REFLEX MICROSCOPIC
Bacteria, UA: NONE SEEN
Bilirubin Urine: NEGATIVE
Glucose, UA: >=500 mg/dL — AB
Ketones, ur: NEGATIVE mg/dL
Leukocytes,Ua: NEGATIVE
Nitrite: NEGATIVE
Protein, ur: NEGATIVE mg/dL
Specific Gravity, Urine: 1.022 (ref 1.005–1.030)
pH: 6 (ref 5.0–8.0)

## 2018-07-18 LAB — WET PREP, GENITAL
Clue Cells Wet Prep HPF POC: NONE SEEN
Sperm: NONE SEEN
Trich, Wet Prep: NONE SEEN
Yeast Wet Prep HPF POC: NONE SEEN

## 2018-07-18 LAB — I-STAT BETA HCG BLOOD, ED (MC, WL, AP ONLY): I-stat hCG, quantitative: <5 m[IU]/mL (ref <5)

## 2018-07-18 LAB — CBG MONITORING, ED: Glucose-Capillary: 343 mg/dL — ABNORMAL HIGH (ref 70–99)

## 2018-07-18 MED ORDER — SODIUM CHLORIDE (PF) 0.9 % IJ SOLN
INTRAMUSCULAR | Status: AC
  Filled 2018-07-18: qty 50

## 2018-07-18 MED ORDER — HYDROMORPHONE HCL 1 MG/ML IJ SOLN
0.5000 mg | Freq: Once | INTRAMUSCULAR | Status: AC
  Administered 2018-07-18: 12:00:00 0.5 mg via INTRAVENOUS
  Filled 2018-07-18: qty 1

## 2018-07-18 MED ORDER — MORPHINE SULFATE (PF) 4 MG/ML IV SOLN
4.0000 mg | Freq: Once | INTRAVENOUS | Status: AC
  Administered 2018-07-18: 4 mg via INTRAVENOUS
  Filled 2018-07-18: qty 1

## 2018-07-18 MED ORDER — SODIUM CHLORIDE 0.9 % IV BOLUS
1000.0000 mL | Freq: Once | INTRAVENOUS | Status: AC
  Administered 2018-07-18: 10:00:00 1000 mL via INTRAVENOUS

## 2018-07-18 MED ORDER — IOHEXOL 300 MG/ML  SOLN
100.0000 mL | Freq: Once | INTRAMUSCULAR | Status: AC | PRN
  Administered 2018-07-18: 100 mL via INTRAVENOUS

## 2018-07-18 NOTE — ED Notes (Signed)
Patient denies vaginal bleeding at this time. No blood noted in pad. Patient changed out into a gown.

## 2018-07-18 NOTE — ED Notes (Signed)
US at bedside

## 2018-07-18 NOTE — Discharge Instructions (Signed)
You can take 400 to 600 mg of ibuprofen with food every 6 hours as needed for pain.  You can also take 1 to 2 tablets of Tylenol every 6 hours.  Do not exceed 4000 mg of Tylenol daily.  You can also apply a heating pad to areas of pain, 20 minutes at a time 3 or 4 times daily.  Your work-up today showed that you have a thickened lining of your uterus.  Please follow-up with an OB/GYN for reevaluation of this.  They will likely take a sample of tissue to test to make sure that it is not abnormal.  Follow-up with your primary care provider for reevaluation of your high blood sugars.  Continue to take your diabetes medications and check your blood sugars regularly.  Return to the emergency department if any concerning signs or symptoms develop such as severe abdominal pains, persistent vomiting, fevers, or severe worsening bleeding.

## 2018-07-18 NOTE — ED Triage Notes (Signed)
Patient states she fell on 07/10/18. Patient states she had lower abdominal following the fall. Patient states she began having light vaginal bleeding immediately following the fall,but the vaginal bleeding is heavier and is bright red and having intermittent abdominal pain. Patient reports no period x 3 years prior to.

## 2018-07-18 NOTE — ED Notes (Signed)
Patient aware need urine sample. Patient will call out once ready to void.

## 2018-07-18 NOTE — ED Provider Notes (Signed)
[facility] DE[facility] Provider Note   CSN: [id number] Arrival date & time: 07/18/18  8937    History   Chief Complaint Chief Complaint  Patient presents with   Fall   Vaginal Bleeding    HPI Tara Keller is a 53 y.o. female with history of diabetes mellitus, anxiety, arthritis, asthma, hypertension, obesity presenting for evaluation of acute onset, progressively worsening vaginal bleeding for 6 days.  She reports that on 07/10/2018 she fell stepping out of a taxi, landing on the ground outside on her abdomen and chest.  She denies head injury or loss of consciousness.  She reports she was experiencing some abdominal soreness shortly thereafter and applied a heating pad.  3 days later she noticed light vaginal spotting which has persisted but worsened last night and is much heavier "like a real period".  She reports she has not had a menstrual cycle in 3 years prior to this and has been saturating a few pads a day.  She notes sharp aching lower abdominal pain mostly on the right side which does not radiate.  Denies nausea, vomiting, fevers, chest pain, shortness of breath, diarrhea, constipation, or urinary symptoms.  No low back pain or pain with bowel movements.  She took Tylenol last night without relief of her symptoms.      The history is provided by the patient.    Past Medical History:  Diagnosis Date   Anxiety    Arthritis    Asthma    Depression    Diabetes mellitus    Fatty liver    Fracture closed of upper end of forearm 9 years, Fx left left leg   Fracture of left lower leg @ 53 years old   H/O tubal ligation    Hiatal hernia    Hypertension    Obesity    Positive H. pylori test    Sleep apnea    uses CPAP   Tubular adenoma of colon     Patient Active Problem List   Diagnosis Date Noted   Lumbar radiculopathy 04/13/2017   Back pain 10/18/2016   Diabetic neuropathy ([place]) 09/15/2016   Arthritis 09/15/2016   Hx of  adenomatous colonic polyps 08/04/2016   OSA on CPAP 07/06/2015   Depression 07/06/2015   Abnormal barium swallow    Abdominal pain, epigastric 08/15/2013   Benign neoplasm of colon 08/15/2013   Special screening for malignant neoplasms, colon 08/15/2013   Hepatic steatosis 05/19/2013   History of pancreatitis 05/19/2013   Family history of colon cancer 05/19/2013   GERD (gastroesophageal reflux disease) 02/18/2013   Smoking 02/18/2013   Morbid obesity ([place]) 05/28/2012   Type 2 diabetes mellitus ([place]) 05/28/2012   Osteoarthritis of left and right knee 05/28/2012   Generalized anxiety disorder 05/28/2012    Past Surgical History:  Procedure Laterality Date   CHOLECYSTECTOMY     COLONOSCOPY WITH PROPOFOL N/A 08/15/2013   Procedure: COLONOSCOPY WITH PROPOFOL;  Surgeon: Jerene Bears, MD;  Location: [facility];  Service: Gastroenterology;  Laterality: N/A;   ESOPHAGOGASTRODUODENOSCOPY N/A 05/12/2014   Procedure: ESOPHAGOGASTRODUODENOSCOPY (EGD);  Surgeon: Jerene Bears, MD;  Location: [facility];  Service: Gastroenterology;  Laterality: N/A;   ESOPHAGOGASTRODUODENOSCOPY (EGD) WITH PROPOFOL N/A 08/15/2013   Procedure: ESOPHAGOGASTRODUODENOSCOPY (EGD) WITH PROPOFOL;  Surgeon: Jerene Bears, MD;  Location: [facility];  Service: Gastroenterology;  Laterality: N/A;   TUBAL LIGATION Bilateral      OB History    Gravida  3   Para  3   Term  2   Preterm  1   AB      Living  4     SAB      TAB      Ectopic      Multiple  1   Live Births               Home Medications    Prior to Admission medications   Medication Sig Start Date End Date Taking? Authorizing Provider  ACCU-CHEK FASTCLIX LANCETS MISC Use as directed 02/06/18   Charlott Rakes, MD  albuterol (PROVENTIL HFA;VENTOLIN HFA) 108 (90 Base) MCG/ACT inhaler Inhale 1-2 puffs into the lungs every 6 (six) hours as needed for wheezing or shortness of breath. 07/17/27   Delora Fuel, MD  amoxicillin  (AMOXIL) 500 MG capsule Take 1 capsule (500 mg total) by mouth 3 (three) times daily. 03/11/18   Ladell Pier, MD  aspirin EC 81 MG tablet Take 1 tablet (81 mg total) by mouth daily. 04/25/16   Maren Reamer, MD  atorvastatin (LIPITOR) 20 MG tablet Take 1 tablet (20 mg total) by mouth daily. 02/06/18   Charlott Rakes, MD  Blood Glucose Monitoring Suppl (ACCU-CHEK GUIDE ME) w/Device KIT 48 Units by Does not apply route 2 (two) times daily. 02/06/18   Charlott Rakes, MD  cetirizine (ZYRTEC) 10 MG tablet TAKE ONE TABLET (10 MG) BY MOUTH EVERY DAY 10/18/17   Charlott Rakes, MD  clonazePAM (KLONOPIN) 1 MG tablet Take 1 mg by mouth at bedtime.    [provider]  diclofenac (VOLTAREN) 75 MG EC tablet Take 1 tablet (75 mg total) by mouth 2 (two) times daily. 02/06/18   Charlott Rakes, MD  fluconazole (DIFLUCAN) 150 MG tablet Take 1 tablet (150 mg total) by mouth daily. Patient not taking: Reported on 03/08/2018 02/06/18   Charlott Rakes, MD  fluconazole (DIFLUCAN) 150 MG tablet Take 1 tablet every Sunday. 04/02/18   Emily Filbert, MD  fluticasone (FLONASE) 50 MCG/ACT nasal spray Place 1-2 sprays into both nostrils daily for 7 days. 01/23/18 01/30/18  Wieters, Hallie C, PA-C  gabapentin (NEURONTIN) 300 MG capsule Take 1 capsule (300 mg total) by mouth 2 (two) times daily. 03/08/18   Ladell Pier, MD  glucose blood (ACCU-CHEK GUIDE) test strip Use as instructed 02/06/18   Charlott Rakes, MD  Insulin Glargine (LANTUS SOLOSTAR) 100 UNIT/ML Solostar Pen Inject 48 Units into the skin 2 (two) times daily. 02/06/18   Charlott Rakes, MD  Insulin Pen Needle 31G X 8 MM MISC Use as directed for 4 times daily insulin administration. 11/07/16   Tresa Garter, MD  Insulin Syringe-Needle U-100 (BD INSULIN SYRINGE ULTRAFINE) 31G X 15/64" 0.5 ML MISC Use as directed 08/11/15   Angelica Chessman E, MD  ipratropium (ATROVENT) 0.03 % nasal spray Place 2 sprays into both nostrils 2 (two) times daily.  03/27/17   Robyn Haber, MD  ketoconazole (NIZORAL) 2 % cream Apply 1 application topically 2 (two) times daily. Patient not taking: Reported on 03/08/2018 03/27/17   Robyn Haber, MD  liraglutide (VICTOZA) 18 MG/3ML SOPN Inject 0.3 mLs (1.8 mg total) into the skin daily. 02/06/18   Charlott Rakes, MD  lisinopril (PRINIVIL,ZESTRIL) 5 MG tablet Take 1 tablet (5 mg total) by mouth daily. 02/06/18   Charlott Rakes, MD  metFORMIN (GLUCOPHAGE-XR) 500 MG 24 hr tablet Take 2 tablets (1,000 mg total) by mouth 2 (two) times daily. 02/06/18   Charlott Rakes, MD  methocarbamol (ROBAXIN)  500 MG tablet Take 2 tablets (1,000 mg total) by mouth 3 (three) times daily. X 10 days then prn muscle spasm 11/07/17   [name] M, PA-C  metoCLOPramide (REGLAN) 5 MG tablet TAKE ONE TABLET BY MOUTH EVERY DAY 06/07/17   Pyrtle, Lajuan Lines, MD  NEEDLE, DISP, 24 G (B-D DISP NEEDLE 24GX1") 24G X 1" MISC Use with solostar pen to inject 4 times daily 07/09/14   Tresa Garter, MD  [facility] powder APPLY TOPICALLY TO THE AFFECTED AREA(S) TWICE DAILY 07/26/17   Charlott Rakes, MD  pantoprazole (PROTONIX) 40 MG tablet Take 1 tablet (40 mg total) by mouth daily. 02/06/18   Charlott Rakes, MD  triamcinolone cream (KENALOG) 0.1 % Apply 1 application topically 2 (two) times daily. 01/18/17   Argentina Donovan, PA-C  Vitamin D, Ergocalciferol, (DRISDOL) 50000 units CAPS capsule Take 1 capsule (50,000 Units total) by mouth every 7 (seven) days. 06/21/17   Charlott Rakes, MD  zolpidem (AMBIEN) 5 MG tablet Take 5 mg by mouth at bedtime.    [provider]    Family History Family History  Problem Relation Age of Onset   Diabetes Mother    Stroke Mother    Hypertension Mother    Breast cancer Sister    Heart attack Sister    Other Daughter        died at age 2   Cirrhosis Father    Colon cancer Maternal Uncle     Social History Social History   Tobacco Use   Smoking status: Current Some Day Smoker     Packs/day: 0.25    Types: Cigarettes    Last attempt to quit: 08/01/2016    Years since quitting: 1.9   Smokeless tobacco: Never Used  Substance Use Topics   Alcohol use: No    Alcohol/week: 0.0 standard drinks   Drug use: No     Allergies   Patient has no known allergies.   Review of Systems Review of Systems  Constitutional: Negative for chills and fever.  Respiratory: Negative for shortness of breath.   Cardiovascular: Negative for chest pain.  Gastrointestinal: Positive for abdominal pain. Negative for constipation, diarrhea, nausea and vomiting.  Genitourinary: Positive for vaginal bleeding. Negative for dysuria, hematuria, vaginal discharge and vaginal pain.  All other systems reviewed and are negative.    Physical Exam Updated Vital Signs BP 106/89 (BP Location: Right Arm)    Pulse 77    Temp 98.2 F (36.8 C) (Oral)    Resp 18    Ht 5' 6"  (1.676 m)    Wt 133.8 kg    LMP 01/23/2014 (Approximate) Comment: neg PREG TEST 07/18/18   SpO2 99%    BMI 47.61 kg/m   Physical Exam Vitals signs and nursing note reviewed.  Constitutional:      General: She is not in acute distress.    Appearance: Normal appearance. She is well-developed. She is obese.  HENT:     Head: Normocephalic and atraumatic.  Eyes:     General:        Right eye: No discharge.        Left eye: No discharge.     Conjunctiva/sclera: Conjunctivae normal.  Neck:     Vascular: No JVD.     Trachea: No tracheal deviation.  Cardiovascular:     Rate and Rhythm: Normal rate and regular rhythm.  Pulmonary:     Effort: Pulmonary effort is normal.     Breath sounds: Normal breath sounds.  Abdominal:     General: Bowel sounds are normal. There is no distension.     Palpations: Abdomen is soft.     Tenderness: There is abdominal tenderness in the right upper quadrant, right lower quadrant and suprapubic area. There is no right CVA tenderness, left CVA tenderness, guarding or rebound.     Comments: Atraumatic  examination externally  Genitourinary:    Comments: Examination performed in the presence of a chaperone.  No masses or lesions to the external genitalia.  No tenderness to palpation of the external genitalia.  No perirectal tenderness.  Moderate amount of red blood in the vaginal vault which appears to be coming from the cervix.  No vaginal wall lacerations noted.  No cervical motion tenderness or adnexal tenderness. Skin:    General: Skin is warm and dry.     Findings: No erythema.  Neurological:     Mental Status: She is alert.  Psychiatric:        Behavior: Behavior normal.      ED Treatments / Results  Labs (all labs ordered are listed, but only abnormal results are displayed) Labs Reviewed  WET PREP, GENITAL - Abnormal; Notable for the following components:      Result Value   WBC, Wet Prep HPF POC MANY (*)    All other components within normal limits  COMPREHENSIVE METABOLIC PANEL - Abnormal; Notable for the following components:   Sodium 133 (*)    Potassium 3.4 (*)    Chloride 93 (*)    Glucose, Bld 412 (*)    Alkaline Phosphatase 162 (*)    All other components within normal limits  LIPASE, BLOOD - Abnormal; Notable for the following components:   Lipase 69 (*)    All other components within normal limits  URINALYSIS, ROUTINE W REFLEX MICROSCOPIC - Abnormal; Notable for the following components:   Glucose, UA >=500 (*)    Hgb urine dipstick MODERATE (*)    All other components within normal limits  CBG MONITORING, ED - Abnormal; Notable for the following components:   Glucose-Capillary 343 (*)    All other components within normal limits  CBC WITH DIFFERENTIAL/PLATELET  I-STAT BETA HCG BLOOD, ED ([facility], [facility], [facility] ONLY)    EKG None  Radiology US Transvaginal Non-ob  Result Date: 07/18/2018 CLINICAL DATA:  53 year old postmenopausal female with pelvic pain and vaginal bleeding. Fall 1 week ago. EXAM: TRANSABDOMINAL AND TRANSVAGINAL ULTRASOUND OF PELVIS TECHNIQUE:  Both transabdominal and transvaginal ultrasound examinations of the pelvis were performed. Transabdominal technique was performed for global imaging of the pelvis including uterus, ovaries, adnexal regions, and pelvic cul-de-sac. It was necessary to proceed with endovaginal exam following the transabdominal exam to visualize the ovaries and endometrium. COMPARISON:  None FINDINGS: Uterus Measurements: 8.1 x 4.6 x 5 cm = volume: 97 mL. A 2.3 cm anterior uterine body fibroid is present. Endometrium Thickness: 9 mm.  No focal abnormality visualized. Right ovary Not visualized Left ovary Not visualized Other findings No abnormal free fluid or solid adnexal mass. IMPRESSION: 1. Endometrial stripe 9 mm. Endometrial thickness is considered abnormal for an asymptomatic post-menopausal female. Endometrial sampling should be considered to exclude carcinoma. 2. 2.3 cm anterior uterine body fibroid. 3. Ovaries not visualized. 4. No free fluid or adnexal mass. Electronically Signed   By: Margarette Canada M.D.   On: 07/18/2018 11:39   US Pelvis Complete  Result Date: 07/18/2018 CLINICAL DATA:  53 year old postmenopausal female with pelvic pain and vaginal bleeding. Fall 1 week ago.  EXAM: TRANSABDOMINAL AND TRANSVAGINAL ULTRASOUND OF PELVIS TECHNIQUE: Both transabdominal and transvaginal ultrasound examinations of the pelvis were performed. Transabdominal technique was performed for global imaging of the pelvis including uterus, ovaries, adnexal regions, and pelvic cul-de-sac. It was necessary to proceed with endovaginal exam following the transabdominal exam to visualize the ovaries and endometrium. COMPARISON:  None FINDINGS: Uterus Measurements: 8.1 x 4.6 x 5 cm = volume: 97 mL. A 2.3 cm anterior uterine body fibroid is present. Endometrium Thickness: 9 mm.  No focal abnormality visualized. Right ovary Not visualized Left ovary Not visualized Other findings No abnormal free fluid or solid adnexal mass. IMPRESSION: 1. Endometrial  stripe 9 mm. Endometrial thickness is considered abnormal for an asymptomatic post-menopausal female. Endometrial sampling should be considered to exclude carcinoma. 2. 2.3 cm anterior uterine body fibroid. 3. Ovaries not visualized. 4. No free fluid or adnexal mass. Electronically Signed   By: Margarette Canada M.D.   On: 07/18/2018 11:39   Ct Abdomen Pelvis W Contrast  Result Date: 07/18/2018 CLINICAL DATA:  Fall, lower right abdominal, vaginal bleeding EXAM: CT ABDOMEN AND PELVIS WITH CONTRAST TECHNIQUE: Multidetector CT imaging of the abdomen and pelvis was performed using the standard protocol following bolus administration of intravenous contrast. CONTRAST:  196m OMNIPAQUE IOHEXOL 300 MG/ML  SOLN COMPARISON:  07/30/2016 FINDINGS: Lower chest: No acute abnormality. Hepatobiliary: No focal liver abnormality is seen. Hepatic steatosis. Status post cholecystectomy. No biliary dilatation. Pancreas: Unremarkable. No pancreatic ductal dilatation or surrounding inflammatory changes. Spleen: Normal in size without significant abnormality. Adrenals/Urinary Tract: Adrenal glands are unremarkable. Kidneys are normal, without renal calculi, solid lesion, or hydronephrosis. Bladder is unremarkable. Stomach/Bowel: Stomach is within normal limits. Appendix appears normal. No evidence of bowel wall thickening, distention, or inflammatory changes. Vascular/Lymphatic: No significant vascular findings are present. No enlarged abdominal or pelvic lymph nodes. Reproductive: Uterine fibroids. Other: No abdominal wall hernia or abnormality. No abdominopelvic ascites. Musculoskeletal: No acute or significant osseous findings. IMPRESSION: 1. No CT evidence of acute traumatic injury to the abdomen or pelvis. 2.  Uterine fibroids. 3. Other chronic, incidental, and postoperative findings as detailed above. Electronically Signed   By: AEddie CandleM.D.   On: 07/18/2018 11:56    Procedures Procedures (including critical care  time)  Medications Ordered in ED Medications  sodium chloride (PF) 0.9 % injection (has no administration in time range)  sodium chloride 0.9 % bolus 1,000 mL (0 mLs Intravenous Stopped 07/18/18 1138)  morphine 4 MG/ML injection 4 mg (4 mg Intravenous Given 07/18/18 0951)  iohexol (OMNIPAQUE) 300 MG/ML solution 100 mL (100 mLs Intravenous Contrast Given 07/18/18 1112)  HYDROmorphone (DILAUDID) injection 0.5 mg (0.5 mg Intravenous Given 07/18/18 1158)     Initial Impression / Assessment and Plan / ED Course  I have reviewed the triage vital signs and the nursing notes.  Pertinent labs & imaging results that were available during my care of the patient were reviewed by me and considered in my medical decision making (see chart for details).        Patient presents for evaluation of progressively worsening vaginal bleeding for 6 days 3 days after mechanical fall resulting in [place] abdominal trauma.  No head injury or loss of consciousness.  Patient afebrile, vital signs are stable.  She is nontoxic in appearance.  She has some lower abdominal tenderness on examination.  Exam shows moderate amount of blood coming from the cervical os.  Examination is not concerning for PID in the absence of cervical motion tenderness or  adnexal tenderness  Lab work reviewed by me shows no leukocytosis, no anemia, no significant electrolyte derangements, no renal insufficiency.  Her glucose is markedly elevated however so she was given IV fluids with improvement in her blood glucose on recheck.  Lipase is mildly elevated however I am unsure of clinical significance as she has no epigastric abdominal tenderness and no signs of pancreatitis on her CT scan today.  CT of the abdomen and pelvis shows no acute intra-abdominal or pelvic abnormalities.  She does have uterine fibroids.  Pelvic ultrasound also shows thickened endometrial stripe especially for a postmenopausal female.  Recommendation for endometrial sampling to  exclude carcinoma.  The patient on reevaluation is resting comfortably in no apparent distress.  Serial abdominal examinations remain benign.  She is tolerating p.o. fluids without difficulty.  Doubt acute surgical abdominal pathology including obstruction, perforation, appendicitis, cholecystitis, or dissection.  She understands to follow-up with her OB/GYN for reevaluation of her abnormal uterine bleeding and her PCP for reevaluation of her hyperglycemia.  Discussed strict ED return precautions.  Patient verbalized understanding of and agreement with plan and patient stable for discharge home at this time.    Final Clinical Impressions(s) / ED Diagnoses   Final diagnoses:  Thickened endometrium  Abnormal uterine bleeding (AUB)  Hyperglycemia    ED Discharge Orders    None       Debroah Baller 07/18/18 1630    Daleen Bo, MD 07/18/18 [phone]

## 2018-07-18 NOTE — ED Notes (Signed)
Pelvic exam set up at bedside.  

## 2018-07-21 ENCOUNTER — Other Ambulatory Visit: Payer: Self-pay

## 2018-07-21 ENCOUNTER — Emergency Department (HOSPITAL_COMMUNITY)
Admission: EM | Admit: 2018-07-21 | Discharge: 2018-07-21 | Disposition: A | Discharge status: Home / Self Care | Payer: Medicaid Other | Attending: Emergency Medicine | Admitting: Emergency Medicine

## 2018-07-21 ENCOUNTER — Encounter (HOSPITAL_COMMUNITY): Payer: Self-pay | Admitting: *Deleted

## 2018-07-21 DIAGNOSIS — J45909 Unspecified asthma, uncomplicated: Secondary | ICD-10-CM | POA: Insufficient documentation

## 2018-07-21 DIAGNOSIS — E1165 Type 2 diabetes mellitus with hyperglycemia: Secondary | ICD-10-CM | POA: Insufficient documentation

## 2018-07-21 DIAGNOSIS — F17210 Nicotine dependence, cigarettes, uncomplicated: Secondary | ICD-10-CM | POA: Diagnosis not present

## 2018-07-21 DIAGNOSIS — Z79899 Other long term (current) drug therapy: Secondary | ICD-10-CM | POA: Insufficient documentation

## 2018-07-21 DIAGNOSIS — R739 Hyperglycemia, unspecified: Secondary | ICD-10-CM

## 2018-07-21 DIAGNOSIS — R1030 Lower abdominal pain, unspecified: Secondary | ICD-10-CM | POA: Diagnosis not present

## 2018-07-21 DIAGNOSIS — N939 Abnormal uterine and vaginal bleeding, unspecified: Secondary | ICD-10-CM | POA: Diagnosis present

## 2018-07-21 DIAGNOSIS — I10 Essential (primary) hypertension: Secondary | ICD-10-CM | POA: Insufficient documentation

## 2018-07-21 DIAGNOSIS — Z794 Long term (current) use of insulin: Secondary | ICD-10-CM | POA: Diagnosis not present

## 2018-07-21 LAB — CBC
HCT: 42.2 % (ref 36.0–46.0)
Hemoglobin: 13.5 g/dL (ref 12.0–15.0)
MCH: 28 pg (ref 26.0–34.0)
MCHC: 32 g/dL (ref 30.0–36.0)
MCV: 87.4 fL (ref 80.0–100.0)
Platelets: 270 10*3/uL (ref 150–400)
RBC: 4.83 MIL/uL (ref 3.87–5.11)
RDW: 12.9 % (ref 11.5–15.5)
WBC: 8.3 10*3/uL (ref 4.0–10.5)
nRBC: 0 % (ref 0.0–0.2)

## 2018-07-21 LAB — COMPREHENSIVE METABOLIC PANEL
ALT: 35 U/L (ref 0–44)
AST: 22 U/L (ref 15–41)
Albumin: 3.3 g/dL — ABNORMAL LOW (ref 3.5–5.0)
Alkaline Phosphatase: 158 U/L — ABNORMAL HIGH (ref 38–126)
Anion gap: 12 (ref 5–15)
BUN: 5 mg/dL — ABNORMAL LOW (ref 6–20)
CO2: 25 mmol/L (ref 22–32)
Calcium: 9 mg/dL (ref 8.9–10.3)
Chloride: 94 mmol/L — ABNORMAL LOW (ref 98–111)
Creatinine, Ser: 0.85 mg/dL (ref 0.44–1.00)
GFR calc Af Amer: >60 mL/min (ref >60)
GFR calc non Af Amer: >60 mL/min (ref >60)
Glucose, Bld: 522 mg/dL (ref 70–99)
Potassium: 3.2 mmol/L — ABNORMAL LOW (ref 3.5–5.1)
Sodium: 131 mmol/L — ABNORMAL LOW (ref 135–145)
Total Bilirubin: 0.5 mg/dL (ref 0.3–1.2)
Total Protein: 6.9 g/dL (ref 6.5–8.1)

## 2018-07-21 LAB — URINALYSIS, ROUTINE W REFLEX MICROSCOPIC
Bacteria, UA: NONE SEEN
Bilirubin Urine: NEGATIVE
Glucose, UA: >=500 mg/dL — AB
Ketones, ur: NEGATIVE mg/dL
Leukocytes,Ua: NEGATIVE
Nitrite: NEGATIVE
Protein, ur: NEGATIVE mg/dL
Specific Gravity, Urine: 1.02 (ref 1.005–1.030)
pH: 7 (ref 5.0–8.0)

## 2018-07-21 LAB — LIPASE, BLOOD: Lipase: 70 U/L — ABNORMAL HIGH (ref 11–51)

## 2018-07-21 LAB — CBG MONITORING, ED: Glucose-Capillary: 284 mg/dL — ABNORMAL HIGH (ref 70–99)

## 2018-07-21 MED ORDER — KETOROLAC TROMETHAMINE 15 MG/ML IJ SOLN
15.0000 mg | Freq: Once | INTRAMUSCULAR | Status: AC
  Administered 2018-07-21: 15 mg via INTRAVENOUS
  Filled 2018-07-21: qty 1

## 2018-07-21 MED ORDER — INSULIN ASPART 100 UNIT/ML ~~LOC~~ SOLN
10.0000 [IU] | Freq: Once | SUBCUTANEOUS | Status: AC
  Administered 2018-07-21: 10 [IU] via SUBCUTANEOUS

## 2018-07-21 MED ORDER — MEGESTROL ACETATE 40 MG PO TABS: 40.0000 mg | ORAL_TABLET | Freq: Every day | ORAL | 0 refills | Status: DC

## 2018-07-21 MED ORDER — POTASSIUM CHLORIDE CRYS ER 20 MEQ PO TBCR
40.0000 meq | EXTENDED_RELEASE_TABLET | Freq: Once | ORAL | Status: AC
  Administered 2018-07-21: 40 meq via ORAL
  Filled 2018-07-21: qty 2

## 2018-07-21 MED ORDER — SODIUM CHLORIDE 0.9 % IV BOLUS
1000.0000 mL | Freq: Once | INTRAVENOUS | Status: AC
  Administered 2018-07-21: 1000 mL via INTRAVENOUS

## 2018-07-21 MED ORDER — SODIUM CHLORIDE 0.9% FLUSH: 3.0000 mL | Freq: Once | INTRAVENOUS | Status: DC

## 2018-07-21 MED ORDER — NAPROXEN 500 MG PO TABS: 500.0000 mg | ORAL_TABLET | Freq: Two times a day (BID) | ORAL | 0 refills | Status: DC

## 2018-07-21 NOTE — ED Notes (Signed)
Pelvic Cart at bedside 

## 2018-07-21 NOTE — ED Triage Notes (Signed)
Pt reports falling on her abdomen on the first of the month. Reports large amounts of bleeding and increased pain started today. Pt has an appt scheduled with GYN in two weeks

## 2018-07-21 NOTE — ED Notes (Signed)
Vaginal bleeding for over a week  She has been seen [facility] ed for the same Thursday night  She has continued to bled and her blood sugar has been out of control for awhile

## 2018-07-21 NOTE — Discharge Instructions (Addendum)
It is important that you take your diabetes medications every day.  Take megace for the next 5 days to slow bleeding. Take naproxen 2 times a day as needed for pain and swelling. Take tylenol as needed for further pain control. Use hot packs for pain. It is very important that you call your ob/gyn for further evaluation and management.  Return to the ER if you develop dizziness/lightheadedness or with any new, worsening, or concerning symptoms.
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
## 2018-08-06 ENCOUNTER — Encounter: Payer: Self-pay | Admitting: Internal Medicine

## 2018-08-06 ENCOUNTER — Ambulatory Visit (INDEPENDENT_AMBULATORY_CARE_PROVIDER_SITE_OTHER): Payer: Medicaid Other | Admitting: Obstetrics & Gynecology

## 2018-08-06 ENCOUNTER — Encounter: Payer: Self-pay | Admitting: Obstetrics & Gynecology

## 2018-08-06 ENCOUNTER — Other Ambulatory Visit: Payer: Self-pay

## 2018-08-06 VITALS — BP 130/74 | HR 81 | Wt 290.6 lb

## 2018-08-06 DIAGNOSIS — N950 Postmenopausal bleeding: Secondary | ICD-10-CM | POA: Diagnosis not present

## 2018-08-06 MED ORDER — MISOPROSTOL 200 MCG PO TABS: ORAL_TABLET | ORAL | 0 refills | Status: DC

## 2018-08-06 NOTE — Progress Notes (Signed)
   Subjective:    Patient ID: Tara Keller, female    DOB: 02-08-1965, 53 y.o.   MRN: [id number]  HPI 53 yo engaged P3 (2 c/s and 1 vag del) here for 1 occasion of PMB 07/17/18. She reports that she has been menopausal for 3 years.   Review of Systems She reports a normal pap smear last year.    Objective:   Physical Exam Breathing, conversing, and ambulating normally Well nourished, well hydrated Black female, no apparent distress Abd- benign, morbidly obese Cervix- normal    Assessment & Plan:  PMB- gyn u/s and EMBX at next visit, pretreat with cytotec.

## 2018-08-10 DIAGNOSIS — K760 Fatty (change of) liver, not elsewhere classified: Secondary | ICD-10-CM

## 2018-08-10 HISTORY — DX: Fatty (change of) liver, not elsewhere classified: K76.0

## 2018-08-19 ENCOUNTER — Other Ambulatory Visit: Payer: Self-pay

## 2018-08-19 ENCOUNTER — Ambulatory Visit: Payer: Medicaid Other | Admitting: *Deleted

## 2018-08-19 VITALS — Ht 66.0 in | Wt 290.0 lb

## 2018-08-19 DIAGNOSIS — Z86010 Personal history of colonic polyps: Secondary | ICD-10-CM

## 2018-08-19 MED ORDER — NA SULFATE-K SULFATE-MG SULF 17.5-3.13-1.6 GM/177ML PO SOLN: ORAL | 0 refills | Status: DC

## 2018-08-19 NOTE — Progress Notes (Signed)
Patient's pre-visit was done today over the phone with the patient due to COVID-19 pandemic. Name,DOB and address verified. Insurance verified. Packet of Prep instructions mailed to patient including copy of a consent form and pre-procedure patient acknowledgement form-pt is aware.  Patient understands to call us back with any questions or concerns.  Patient denies any allergies to eggs or soy. Patient denies any problems with anesthesia/sedation. Patient denies any oxygen use at home. Patient denies taking any diet/weight loss medications or blood thinners. Pt is aware that care partner will wait in the car during procedure; if they feel like they will be too hot to wait in the car; they may wait in the lobby.  We want them to wear a mask (we do not have any that we can provide them), practice social distancing, and we will check their temperatures when they get here.  I did remind patient that their care partner needs to stay in the parking lot the entire time. Pt will wear mask into building. 

## 2018-08-20 ENCOUNTER — Ambulatory Visit (HOSPITAL_COMMUNITY)
Admission: RE | Admit: 2018-08-20 | Discharge: 2018-08-20 | Disposition: A | Discharge status: Home / Self Care | Payer: Medicaid Other | Source: Ambulatory Visit | Attending: Obstetrics & Gynecology | Admitting: Obstetrics & Gynecology

## 2018-08-20 ENCOUNTER — Other Ambulatory Visit: Payer: Self-pay

## 2018-08-20 ENCOUNTER — Encounter (HOSPITAL_COMMUNITY): Payer: Self-pay

## 2018-08-20 ENCOUNTER — Emergency Department (HOSPITAL_COMMUNITY): Payer: Medicaid Other

## 2018-08-20 ENCOUNTER — Emergency Department (HOSPITAL_COMMUNITY)
Admission: EM | Admit: 2018-08-20 | Discharge: 2018-08-20 | Disposition: A | Discharge status: Home / Self Care | Payer: Medicaid Other | Attending: Emergency Medicine | Admitting: Emergency Medicine

## 2018-08-20 DIAGNOSIS — E119 Type 2 diabetes mellitus without complications: Secondary | ICD-10-CM | POA: Diagnosis not present

## 2018-08-20 DIAGNOSIS — N950 Postmenopausal bleeding: Secondary | ICD-10-CM | POA: Insufficient documentation

## 2018-08-20 DIAGNOSIS — Z7982 Long term (current) use of aspirin: Secondary | ICD-10-CM | POA: Insufficient documentation

## 2018-08-20 DIAGNOSIS — M25562 Pain in left knee: Secondary | ICD-10-CM | POA: Diagnosis not present

## 2018-08-20 DIAGNOSIS — J45909 Unspecified asthma, uncomplicated: Secondary | ICD-10-CM | POA: Diagnosis not present

## 2018-08-20 DIAGNOSIS — I10 Essential (primary) hypertension: Secondary | ICD-10-CM | POA: Insufficient documentation

## 2018-08-20 DIAGNOSIS — Z794 Long term (current) use of insulin: Secondary | ICD-10-CM | POA: Diagnosis not present

## 2018-08-20 DIAGNOSIS — F17210 Nicotine dependence, cigarettes, uncomplicated: Secondary | ICD-10-CM | POA: Insufficient documentation

## 2018-08-20 DIAGNOSIS — Z79899 Other long term (current) drug therapy: Secondary | ICD-10-CM | POA: Diagnosis not present

## 2018-08-20 MED ORDER — DICLOFENAC SODIUM 1 % TD GEL: 2.0000 g | Freq: Four times a day (QID) | TRANSDERMAL | 0 refills | Status: DC

## 2018-08-20 MED ORDER — KETOROLAC TROMETHAMINE 15 MG/ML IJ SOLN
15.0000 mg | Freq: Once | INTRAMUSCULAR | Status: AC
  Administered 2018-08-20: 15 mg via INTRAMUSCULAR
  Filled 2018-08-20: qty 1

## 2018-08-20 NOTE — ED Notes (Signed)
ED Provider at bedside. 

## 2018-08-20 NOTE — Discharge Instructions (Signed)
Use Tylenol and ibuprofen as needed for pain. Use Voltaren gel to help with pain and swelling. Use the knee sleeve to help support your knee. You symptoms not improving, follow-up with orthopedic doctor.  You may also follow-up with your primary care. Return to the emergency room with any new, worsening, concerning symptoms.

## 2018-08-20 NOTE — ED Triage Notes (Signed)
Pt reports L knee pain that began while walking into store. Pt denies any injury or trauma to the area.

## 2018-08-20 NOTE — ED Provider Notes (Signed)
[facility] DEPT Provider Note   CSN: [id number] Arrival date & time: 08/20/18  1140    History   Chief Complaint Chief Complaint  Patient presents with   Knee Pain    HPI Tara Keller is a 53 y.o. female presenting for evaluation of left knee pain.  Patient states she started to have knee pain yesterday evening.  When she woke up this morning, pain was worse causing her to limp when she walks.  She denies fall, trauma, or injury yesterday.  Patient states she did walk to the store, but does not remember twisting or injuring her knee.  She has taken Advil, ibuprofen, and Tylenol without improvement of symptoms.  Patient states she feels like there is improved mobility and there is fluid in her knee.  She has needed a symptomatic arthrocentesis in the past, but this was several years ago and she does not remember the orthopedic practice she was with.  She has not followed up since.  She denies fevers or chills. Pain is constant, worse with ambulation and weight bearing.      HPI  Past Medical History:  Diagnosis Date   Allergy    Anxiety    Arthritis    Asthma    Depression    Diabetes mellitus    Fatty liver    Fracture closed of upper end of forearm 9 years, Fx left left leg   Fracture of left lower leg @ 53 years old   H/O tubal ligation    Hiatal hernia    Hyperlipidemia    Hypertension    Obesity    Positive H. pylori test    Sleep apnea    not using CPAP   Tubular adenoma of colon     Patient Active Problem List   Diagnosis Date Noted   Lumbar radiculopathy 04/13/2017   Back pain 10/18/2016   Diabetic neuropathy ([place]) 09/15/2016   Arthritis 09/15/2016   Hx of adenomatous colonic polyps 08/04/2016   OSA on CPAP 07/06/2015   Depression 07/06/2015   Abnormal barium swallow    Abdominal pain, epigastric 08/15/2013   Benign neoplasm of colon 08/15/2013   Special screening for malignant neoplasms,  colon 08/15/2013   Hepatic steatosis 05/19/2013   History of pancreatitis 05/19/2013   Family history of colon cancer 05/19/2013   GERD (gastroesophageal reflux disease) 02/18/2013   Smoking 02/18/2013   Morbid obesity ([place]) 05/28/2012   Type 2 diabetes mellitus ([place]) 05/28/2012   Osteoarthritis of left and right knee 05/28/2012   Generalized anxiety disorder 05/28/2012    Past Surgical History:  Procedure Laterality Date   CHOLECYSTECTOMY     COLONOSCOPY WITH PROPOFOL N/A 08/15/2013   Procedure: COLONOSCOPY WITH PROPOFOL;  Surgeon: Jerene Bears, MD;  Location: [facility];  Service: Gastroenterology;  Laterality: N/A;   ESOPHAGOGASTRODUODENOSCOPY N/A 05/12/2014   Procedure: ESOPHAGOGASTRODUODENOSCOPY (EGD);  Surgeon: Jerene Bears, MD;  Location: [facility];  Service: Gastroenterology;  Laterality: N/A;   ESOPHAGOGASTRODUODENOSCOPY (EGD) WITH PROPOFOL N/A 08/15/2013   Procedure: ESOPHAGOGASTRODUODENOSCOPY (EGD) WITH PROPOFOL;  Surgeon: Jerene Bears, MD;  Location: [facility];  Service: Gastroenterology;  Laterality: N/A;   TUBAL LIGATION Bilateral      OB History    Gravida  3   Para  3   Term  2   Preterm  1   AB      Living  4     SAB      TAB  Ectopic      Multiple  1   Live Births               Home Medications    Prior to Admission medications   Medication Sig Start Date End Date Taking? Authorizing Provider  ACCU-CHEK FASTCLIX LANCETS MISC Use as directed 02/06/18   Charlott Rakes, MD  albuterol (PROVENTIL HFA;VENTOLIN HFA) 108 (90 Base) MCG/ACT inhaler Inhale 1-2 puffs into the lungs every 6 (six) hours as needed for wheezing or shortness of breath. 02/14/47   Delora Fuel, MD  aspirin EC 81 MG tablet Take 1 tablet (81 mg total) by mouth daily. 04/25/16   Maren Reamer, MD  atorvastatin (LIPITOR) 20 MG tablet Take 1 tablet (20 mg total) by mouth daily. 02/06/18   Charlott Rakes, MD  Blood Glucose Monitoring Suppl (ACCU-CHEK GUIDE  ME) w/Device KIT 48 Units by Does not apply route 2 (two) times daily. 02/06/18   Charlott Rakes, MD  buPROPion (WELLBUTRIN XL) 300 MG 24 hr tablet Take 300 mg by mouth daily. 03/26/18   [provider]  cetirizine (ZYRTEC) 10 MG tablet TAKE ONE TABLET (10 MG) BY MOUTH EVERY DAY 10/18/17   Charlott Rakes, MD  clonazePAM (KLONOPIN) 1 MG tablet Take 1 mg by mouth at bedtime.    [provider]  diclofenac (VOLTAREN) 75 MG EC tablet Take 1 tablet (75 mg total) by mouth 2 (two) times daily. 02/06/18   Charlott Rakes, MD  diclofenac sodium (VOLTAREN) 1 % GEL Apply 2 g topically 4 (four) times daily. 08/20/18   Maitlyn Penza, PA-C  fluticasone (FLONASE) 50 MCG/ACT nasal spray Place 1-2 sprays into both nostrils daily for 7 days. 01/23/18 07/21/27  Wieters, Hallie C, PA-C  gabapentin (NEURONTIN) 300 MG capsule Take 1 capsule (300 mg total) by mouth 2 (two) times daily. 03/08/18   Ladell Pier, MD  glucose blood (ACCU-CHEK GUIDE) test strip Use as instructed 02/06/18   Charlott Rakes, MD  Insulin Glargine (LANTUS SOLOSTAR) 100 UNIT/ML Solostar Pen Inject 48 Units into the skin 2 (two) times daily. Patient taking differently: Inject 52 Units into the skin 2 (two) times daily.  02/06/18   Charlott Rakes, MD  Insulin Pen Needle 31G X 8 MM MISC Use as directed for 4 times daily insulin administration. 11/07/16   Tresa Garter, MD  Insulin Syringe-Needle U-100 (BD INSULIN SYRINGE ULTRAFINE) 31G X 15/64" 0.5 ML MISC Use as directed 08/11/15   Tresa Garter, MD  ketoconazole (NIZORAL) 2 % cream Apply 1 application topically 2 (two) times daily. 03/27/17   Robyn Haber, MD  liraglutide (VICTOZA) 18 MG/3ML SOPN Inject 0.3 mLs (1.8 mg total) into the skin daily. 02/06/18   Charlott Rakes, MD  lisinopril (PRINIVIL,ZESTRIL) 5 MG tablet Take 1 tablet (5 mg total) by mouth daily. 02/06/18   Charlott Rakes, MD  megestrol (MEGACE) 40 MG tablet Take 1 tablet (40 mg total) by mouth  daily. 07/21/18   Cruze Zingaro, PA-C  metFORMIN (GLUCOPHAGE-XR) 500 MG 24 hr tablet Take 2 tablets (1,000 mg total) by mouth 2 (two) times daily. 02/06/18   Charlott Rakes, MD  methocarbamol (ROBAXIN) 500 MG tablet Take 2 tablets (1,000 mg total) by mouth 3 (three) times daily. X 10 days then prn muscle spasm 11/07/17   [name] M, PA-C  metoCLOPramide (REGLAN) 5 MG tablet TAKE ONE TABLET BY MOUTH EVERY DAY Patient taking differently: Take 5 mg by mouth daily.  06/07/17   Pyrtle, Lajuan Lines, MD  misoprostol (CYTOTEC) 100 MCG tablet TK 6 TS PO THE NIGHT BEFORE BIOPSY 08/06/18   [provider]  misoprostol (CYTOTEC) 200 MCG tablet Take 3 pills ORALLY the night before the biopsy. Patient not taking: Reported on 08/19/2018 08/06/18   Emily Filbert, MD  Na Sulfate-K Sulfate-Mg Sulf 17.5-3.13-1.6 GM/177ML SOLN Suprep (no substitutions)-TAKE AS DIRECTED. 08/19/18   Pyrtle, Lajuan Lines, MD  naproxen (NAPROSYN) 500 MG tablet Take 1 tablet (500 mg total) by mouth 2 (two) times daily with a meal. Patient not taking: Reported on 08/06/2018 07/21/18   Khamia Stambaugh, PA-C  NEEDLE, DISP, 24 G (B-D DISP NEEDLE 24GX1") 24G X 1" MISC Use with solostar pen to inject 4 times daily 07/09/14   Tresa Garter, MD  [facility] powder APPLY TOPICALLY TO THE AFFECTED AREA(S) TWICE DAILY Patient not taking: Reported on 07/21/2018 07/26/17   Charlott Rakes, MD  pantoprazole (PROTONIX) 40 MG tablet Take 1 tablet (40 mg total) by mouth daily. 02/06/18   Charlott Rakes, MD  triamcinolone cream (KENALOG) 0.1 % Apply 1 application topically 2 (two) times daily. 01/18/17   Argentina Donovan, PA-C  Vitamin D, Ergocalciferol, (DRISDOL) 50000 units CAPS capsule Take 1 capsule (50,000 Units total) by mouth every 7 (seven) days. Patient taking differently: Take 50,000 Units by mouth every Friday.  06/21/17   Charlott Rakes, MD  zolpidem (AMBIEN) 5 MG tablet Take 5 mg by mouth at bedtime.    [provider]    Family  History Family History  Problem Relation Age of Onset   Diabetes Mother    Stroke Mother    Hypertension Mother    Breast cancer Sister    Heart attack Sister    Other Daughter        died at age 53   Cirrhosis Father    Colon cancer Maternal Uncle    Colon polyps Neg Hx    Esophageal cancer Neg Hx    Stomach cancer Neg Hx    Rectal cancer Neg Hx     Social History Social History   Tobacco Use   Smoking status: Current Every Day Smoker    Packs/day: 0.25    Types: Cigarettes    Last attempt to quit: 08/01/2016    Years since quitting: 2.0   Smokeless tobacco: Never Used  Substance Use Topics   Alcohol use: No    Alcohol/week: 0.0 standard drinks   Drug use: No     Allergies   Patient has no known allergies.   Review of Systems Review of Systems  Musculoskeletal: Positive for arthralgias and joint swelling.  Neurological: Negative for numbness.     Physical Exam Updated Vital Signs BP (!) 127/95 (BP Location: Left Arm)    Pulse 82    Temp 98.2 F (36.8 C) (Oral)    Resp 20    Ht 5' 6"  (1.676 m)    Wt 134.3 kg    LMP 01/23/2014 (Approximate) Comment: neg PREG TEST 07/18/18   SpO2 96%    BMI 47.78 kg/m   Physical Exam Vitals signs and nursing note reviewed.  Constitutional:      General: She is not in acute distress.    Appearance: She is well-developed.     Comments: Obese female sitting in the bed in no distress.  Appears nontoxic.  HENT:     Head: Normocephalic and atraumatic.  Neck:     Musculoskeletal: Normal range of motion.  Pulmonary:     Effort: Pulmonary effort is  normal.  Abdominal:     General: There is no distension.  Musculoskeletal:        General: Swelling and tenderness present.     Comments: Mild swelling noted of the left knee.  Tenderness palpation along the medial joint line.  No erythema or warmth.  No difficulty ranging.  Pedal pulses intact.  Patient is ambulatory with a limp due to pain  Skin:    General: Skin is  warm.     Capillary Refill: Capillary refill takes less than 2 seconds.     Findings: No rash.  Neurological:     Mental Status: She is alert and oriented to person, place, and time.      ED Treatments / Results  Labs (all labs ordered are listed, but only abnormal results are displayed) Labs Reviewed - No data to display  EKG None  Radiology US Pelvis Transvaginal Non-ob (tv Only)  Result Date: 08/20/2018 CLINICAL DATA:  Postmenopausal bleeding. EXAM: TRANSABDOMINAL AND TRANSVAGINAL ULTRASOUND OF PELVIS TECHNIQUE: Both transabdominal and transvaginal ultrasound examinations of the pelvis were performed. Transabdominal technique was performed for global imaging of the pelvis including uterus, ovaries, adnexal regions, and pelvic cul-de-sac. It was necessary to proceed with endovaginal exam following the transabdominal exam to visualize the endometrium and ovaries. COMPARISON:  06/18/2018 FINDINGS: Uterus Measurements: 7.1 x 5.7 x 6.0 cm = volume: 128 mL. A 2.5 cm subserosal fibroid is seen in the left anterior lower uterine segment, without change since recent exam. Endometrium Thickness: 7 mm.  No focal abnormality visualized. Right ovary Measurements: Not visualized, however no adnexal mass identified. Left ovary Measurements: Not visualized, however no adnexal mass identified. Other findings No abnormal free fluid. IMPRESSION: Endometrial thickness measures 7 mm. In the setting of post-menopausal bleeding, endometrial sampling is indicated to exclude carcinoma. If results are benign, sonohysterogram should be considered for focal lesion work-up. (Ref: Radiological Reasoning: Algorithmic Workup of Abnormal Vaginal Bleeding with Endovaginal Sonography and Sonohysterography. AJR 2008; 462:V03-50) Stable 2.5 cm subserosal fibroid in lower uterine segment. Nonvisualization of ovaries, however no adnexal mass identified. Electronically Signed   By: Marlaine Hind M.D.   On: 08/20/2018 13:09   Dg  Knee Complete 4 Views Left  Result Date: 08/20/2018 CLINICAL DATA:  Medial and inferior knee pain for 3 days, no injury EXAM: LEFT KNEE - COMPLETE 4+ VIEW COMPARISON:  Radiographs June 24, 2012 FINDINGS: Tricompartmental degenerative changes in the left knee. Features are moderate in the patellofemoral and medial femorotibial compartments and mild in the lateral compartment. Medial compartmental narrowing is incompletely assessed on nonweightbearing radiographs. No acute fracture or traumatic malalignment. Small suprapatellar joint effusion is present. Soft tissues are otherwise unremarkable. IMPRESSION: Tricompartmental degenerative changes, moderate in the patellofemoral and medial femorotibial compartments. No acute osseous abnormality. Electronically Signed   By: Lovena Le M.D.   On: 08/20/2018 14:51    Procedures Procedures (including critical care time)  Medications Ordered in ED Medications  ketorolac (TORADOL) 15 MG/ML injection 15 mg (has no administration in time range)     Initial Impression / Assessment and Plan / ED Course  I have reviewed the triage vital signs and the nursing notes.  Pertinent labs & imaging results that were available during my care of the patient were reviewed by me and considered in my medical decision making (see chart for details).        Patient resenting for evaluation of left knee pain and swelling.  Physical exam reassuring, she appears naproxen.  No fevers,  chills, erythema, or difficulty ranging, doubt septic joint.  History of arthritis requiring an arthrocentesis in the past, likely the same again.  However, has not needed this for several years and has not had a recent x-ray.  Will obtain x-ray and reassess.  X-ray viewed interpreted by me, shows arthritis with mild effusion.  Burring noted of the medial knee.  Discussed findings with patient.  Discussed treatment for arthritis with Toradol shot, Tylenol, ibuprofen, Voltaren gel, knee sleeve.   Follow-up with orthopedics if symptoms not proving.  Will hold on steroids as patient has a history of DM. At this time, pt appears safe for d/c. Return precautions given. Pt states she understands and agrees to plan.   Final Clinical Impressions(s) / ED Diagnoses   Final diagnoses:  Acute pain of left knee  Arthralgia of left knee    ED Discharge Orders         Ordered    diclofenac sodium (VOLTAREN) 1 % GEL  4 times daily     08/20/18 1501           Franchot Heidelberg, PA-C 08/20/18 1514    Milton Ferguson, MD 08/21/18 [phone]

## 2018-08-21 ENCOUNTER — Other Ambulatory Visit (HOSPITAL_COMMUNITY)
Admission: RE | Admit: 2018-08-21 | Discharge: 2018-08-21 | Disposition: A | Discharge status: Home / Self Care | Payer: Medicaid Other | Source: Ambulatory Visit | Attending: Obstetrics & Gynecology | Admitting: Obstetrics & Gynecology

## 2018-08-21 ENCOUNTER — Ambulatory Visit (INDEPENDENT_AMBULATORY_CARE_PROVIDER_SITE_OTHER): Payer: Medicaid Other | Admitting: Obstetrics & Gynecology

## 2018-08-21 ENCOUNTER — Encounter: Payer: Self-pay | Admitting: Obstetrics & Gynecology

## 2018-08-21 VITALS — BP 150/97 | HR 87 | Wt 291.0 lb

## 2018-08-21 DIAGNOSIS — N950 Postmenopausal bleeding: Secondary | ICD-10-CM | POA: Insufficient documentation

## 2018-08-21 DIAGNOSIS — C541 Malignant neoplasm of endometrium: Secondary | ICD-10-CM

## 2018-08-21 NOTE — Progress Notes (Signed)
   Subjective:    Patient ID: Tara Keller, female    DOB: [birth year], 53 y.o.   MRN: [id number]  HPI 53 yo enaged P3 here for a EMBX. She had PMB and her endometrium measured 7 mm yesterday. She was prescribed cytotec to take prior to the biopsy.   Review of Systems     Objective:   Physical Exam Breathing, conversing, and ambulating normally Well nourished, well hydrated Black female, no apparent distress  UPT negative, consent signed, time out done Cervix prepped with betadine and grasped with a single tooth tenaculum Uterus sounded to 9 cm Pipelle used for 2 passes with a moderate amount of tissue obtained. She tolerated the procedure well.     Assessment & Plan:  PMB- await EMBX results I will call her with results

## 2018-08-22 ENCOUNTER — Other Ambulatory Visit: Payer: Self-pay | Admitting: Obstetrics & Gynecology

## 2018-08-22 DIAGNOSIS — C541 Malignant neoplasm of endometrium: Secondary | ICD-10-CM

## 2018-08-22 NOTE — Progress Notes (Signed)
I gave her the news about her Grade 1 endometrial cancer. I told her that I would refer her to the gyn oncologist. I rec'd that she start taking a MVI and work on her diet and controlling her DM.

## 2018-08-29 ENCOUNTER — Emergency Department (HOSPITAL_COMMUNITY)
Admission: EM | Admit: 2018-08-29 | Discharge: 2018-08-29 | Disposition: A | Discharge status: Home / Self Care | Payer: Medicaid Other | Attending: Emergency Medicine | Admitting: Emergency Medicine

## 2018-08-29 ENCOUNTER — Other Ambulatory Visit: Payer: Self-pay

## 2018-08-29 ENCOUNTER — Encounter (HOSPITAL_COMMUNITY): Payer: Self-pay | Admitting: Emergency Medicine

## 2018-08-29 ENCOUNTER — Emergency Department (HOSPITAL_COMMUNITY): Payer: Medicaid Other

## 2018-08-29 ENCOUNTER — Encounter: Payer: Self-pay | Admitting: General Practice

## 2018-08-29 DIAGNOSIS — R1084 Generalized abdominal pain: Secondary | ICD-10-CM

## 2018-08-29 DIAGNOSIS — C541 Malignant neoplasm of endometrium: Secondary | ICD-10-CM | POA: Insufficient documentation

## 2018-08-29 DIAGNOSIS — R109 Unspecified abdominal pain: Secondary | ICD-10-CM | POA: Diagnosis present

## 2018-08-29 DIAGNOSIS — E1165 Type 2 diabetes mellitus with hyperglycemia: Secondary | ICD-10-CM | POA: Diagnosis not present

## 2018-08-29 DIAGNOSIS — Z79899 Other long term (current) drug therapy: Secondary | ICD-10-CM | POA: Diagnosis not present

## 2018-08-29 DIAGNOSIS — J45909 Unspecified asthma, uncomplicated: Secondary | ICD-10-CM | POA: Diagnosis not present

## 2018-08-29 DIAGNOSIS — Z794 Long term (current) use of insulin: Secondary | ICD-10-CM | POA: Insufficient documentation

## 2018-08-29 DIAGNOSIS — I10 Essential (primary) hypertension: Secondary | ICD-10-CM | POA: Insufficient documentation

## 2018-08-29 DIAGNOSIS — Z7982 Long term (current) use of aspirin: Secondary | ICD-10-CM | POA: Insufficient documentation

## 2018-08-29 DIAGNOSIS — M545 Low back pain, unspecified: Secondary | ICD-10-CM

## 2018-08-29 DIAGNOSIS — R739 Hyperglycemia, unspecified: Secondary | ICD-10-CM

## 2018-08-29 DIAGNOSIS — E1169 Type 2 diabetes mellitus with other specified complication: Secondary | ICD-10-CM

## 2018-08-29 DIAGNOSIS — F17210 Nicotine dependence, cigarettes, uncomplicated: Secondary | ICD-10-CM | POA: Insufficient documentation

## 2018-08-29 LAB — I-STAT BETA HCG BLOOD, ED (MC, WL, AP ONLY): I-stat hCG, quantitative: <5 m[IU]/mL (ref <5)

## 2018-08-29 LAB — COMPREHENSIVE METABOLIC PANEL
ALT: 58 U/L — ABNORMAL HIGH (ref 0–44)
AST: 36 U/L (ref 15–41)
Albumin: 4 g/dL (ref 3.5–5.0)
Alkaline Phosphatase: 193 U/L — ABNORMAL HIGH (ref 38–126)
Anion gap: 10 (ref 5–15)
BUN: 11 mg/dL (ref 6–20)
CO2: 26 mmol/L (ref 22–32)
Calcium: 9.8 mg/dL (ref 8.9–10.3)
Chloride: 94 mmol/L — ABNORMAL LOW (ref 98–111)
Creatinine, Ser: 0.75 mg/dL (ref 0.44–1.00)
GFR calc Af Amer: >60 mL/min (ref >60)
GFR calc non Af Amer: >60 mL/min (ref >60)
Glucose, Bld: 412 mg/dL — ABNORMAL HIGH (ref 70–99)
Potassium: 4 mmol/L (ref 3.5–5.1)
Sodium: 130 mmol/L — ABNORMAL LOW (ref 135–145)
Total Bilirubin: 0.5 mg/dL (ref 0.3–1.2)
Total Protein: 8.6 g/dL — ABNORMAL HIGH (ref 6.5–8.1)

## 2018-08-29 LAB — URINALYSIS, ROUTINE W REFLEX MICROSCOPIC
Bacteria, UA: NONE SEEN
Bilirubin Urine: NEGATIVE
Glucose, UA: >=500 mg/dL — AB
Ketones, ur: NEGATIVE mg/dL
Leukocytes,Ua: NEGATIVE
Nitrite: NEGATIVE
Protein, ur: NEGATIVE mg/dL
Specific Gravity, Urine: 1.025 (ref 1.005–1.030)
pH: 6 (ref 5.0–8.0)

## 2018-08-29 LAB — CBC
HCT: 46.8 % — ABNORMAL HIGH (ref 36.0–46.0)
Hemoglobin: 15.1 g/dL — ABNORMAL HIGH (ref 12.0–15.0)
MCH: 27.6 pg (ref 26.0–34.0)
MCHC: 32.3 g/dL (ref 30.0–36.0)
MCV: 85.6 fL (ref 80.0–100.0)
Platelets: 281 10*3/uL (ref 150–400)
RBC: 5.47 MIL/uL — ABNORMAL HIGH (ref 3.87–5.11)
RDW: 13 % (ref 11.5–15.5)
WBC: 9.5 10*3/uL (ref 4.0–10.5)
nRBC: 0 % (ref 0.0–0.2)

## 2018-08-29 LAB — LIPASE, BLOOD: Lipase: 56 U/L — ABNORMAL HIGH (ref 11–51)

## 2018-08-29 LAB — CBG MONITORING, ED: Glucose-Capillary: 312 mg/dL — ABNORMAL HIGH (ref 70–99)

## 2018-08-29 MED ORDER — SODIUM CHLORIDE 0.9 % IV BOLUS
1000.0000 mL | Freq: Once | INTRAVENOUS | Status: AC
  Administered 2018-08-29: 1000 mL via INTRAVENOUS

## 2018-08-29 MED ORDER — INSULIN ASPART 100 UNIT/ML ~~LOC~~ SOLN
8.0000 [IU] | Freq: Once | SUBCUTANEOUS | Status: AC
  Administered 2018-08-29: 8 [IU] via SUBCUTANEOUS
  Filled 2018-08-29: qty 0.08

## 2018-08-29 MED ORDER — LIDOCAINE 5 % EX PTCH
1.0000 | MEDICATED_PATCH | CUTANEOUS | Status: DC
  Administered 2018-08-29: 1 via TRANSDERMAL
  Filled 2018-08-29: qty 1

## 2018-08-29 MED ORDER — IOHEXOL 300 MG/ML  SOLN
100.0000 mL | Freq: Once | INTRAMUSCULAR | Status: AC | PRN
  Administered 2018-08-29: 18:00:00 100 mL via INTRAVENOUS

## 2018-08-29 MED ORDER — SODIUM CHLORIDE (PF) 0.9 % IJ SOLN
INTRAMUSCULAR | Status: AC
  Administered 2018-08-29: 19:00:00
  Filled 2018-08-29: qty 50

## 2018-08-29 MED ORDER — ACCU-CHEK GUIDE VI STRP: ORAL_STRIP | 12 refills | Status: DC

## 2018-08-29 NOTE — Discharge Instructions (Addendum)
Follow up with your doctors as planned. Return to the ER for new or worsening pain.  Warm compresses to right lower back for pain for 20 minutes at a time. Apply OTC lidocaine patch to area for pain. Monitor your blood sugar and take your medications as prescribed.

## 2018-08-29 NOTE — ED Triage Notes (Signed)
Pt reports that she recently diagnosed with cervical cancer and had biopsy done on 12th and had vaginal bleeding since. Pt c/o of abd pains that radiate to right flank for past several days.

## 2018-08-29 NOTE — ED Provider Notes (Signed)
[place] DEPT Provider Note   CSN: [id number] Arrival date & time: 08/29/18  1450     History   Chief Complaint Chief Complaint  Patient presents with   Abdominal Pain   Vaginal Bleeding    HPI Tara Keller is a 53 y.o. female.     53yo female with complaint of right flank pain onset last night, pain is intermittent, improves with Ibuprofen and Tylenol, described as sharp, aching, throbbing, burning. Pain worse with movement or if standing or laying for too long. Reports urinary frequency, denies dysuria. Denies changes in bowel habits, nausea, vomiting, fevers, chills. Had endometrial biopsy and was diagnosed with cancer 1 week ago, scheduled for hysterectomy.  Scheduled for colonoscopy on Monday.      Past Medical History:  Diagnosis Date   Allergy    Anxiety    Arthritis    Asthma    Depression    Diabetes mellitus    Fatty liver    Fracture closed of upper end of forearm 9 years, Fx left left leg   Fracture of left lower leg @ 53 years old   H/O tubal ligation    Hiatal hernia    Hyperlipidemia    Hypertension    Obesity    Positive H. pylori test    Sleep apnea    not using CPAP   Tubular adenoma of colon     Patient Active Problem List   Diagnosis Date Noted   Lumbar radiculopathy 04/13/2017   Back pain 10/18/2016   Diabetic neuropathy ([place]) 09/15/2016   Arthritis 09/15/2016   Hx of adenomatous colonic polyps 08/04/2016   OSA on CPAP 07/06/2015   Depression 07/06/2015   Abnormal barium swallow    Abdominal pain, epigastric 08/15/2013   Benign neoplasm of colon 08/15/2013   Special screening for malignant neoplasms, colon 08/15/2013   Hepatic steatosis 05/19/2013   History of pancreatitis 05/19/2013   Family history of colon cancer 05/19/2013   GERD (gastroesophageal reflux disease) 02/18/2013   Smoking 02/18/2013   Morbid obesity ([place]) 05/28/2012   Type 2 diabetes mellitus  ([place]) 05/28/2012   Osteoarthritis of left and right knee 05/28/2012   Generalized anxiety disorder 05/28/2012    Past Surgical History:  Procedure Laterality Date   CHOLECYSTECTOMY     COLONOSCOPY WITH PROPOFOL N/A 08/15/2013   Procedure: COLONOSCOPY WITH PROPOFOL;  Surgeon: Jerene Bears, MD;  Location: [facility];  Service: Gastroenterology;  Laterality: N/A;   ESOPHAGOGASTRODUODENOSCOPY N/A 05/12/2014   Procedure: ESOPHAGOGASTRODUODENOSCOPY (EGD);  Surgeon: Jerene Bears, MD;  Location: [facility];  Service: Gastroenterology;  Laterality: N/A;   ESOPHAGOGASTRODUODENOSCOPY (EGD) WITH PROPOFOL N/A 08/15/2013   Procedure: ESOPHAGOGASTRODUODENOSCOPY (EGD) WITH PROPOFOL;  Surgeon: Jerene Bears, MD;  Location: [facility];  Service: Gastroenterology;  Laterality: N/A;   TUBAL LIGATION Bilateral      OB History    Gravida  3   Para  3   Term  2   Preterm  1   AB      Living  4     SAB      TAB      Ectopic      Multiple  1   Live Births               Home Medications    Prior to Admission medications   Medication Sig Start Date End Date Taking? Authorizing Provider  albuterol (PROVENTIL HFA;VENTOLIN HFA) 108 (90 Base) MCG/ACT inhaler Inhale 1-2  puffs into the lungs every 6 (six) hours as needed for wheezing or shortness of breath. 01/15/08  Yes Delora Fuel, MD  aspirin EC 81 MG tablet Take 1 tablet (81 mg total) by mouth daily. 04/25/16  Yes Langeland, Dawn T, MD  atorvastatin (LIPITOR) 20 MG tablet Take 1 tablet (20 mg total) by mouth daily. 02/06/18  Yes Charlott Rakes, MD  benztropine (COGENTIN) 0.5 MG tablet Take 0.5 mg by mouth at bedtime.   Yes [provider]  buPROPion (WELLBUTRIN XL) 300 MG 24 hr tablet Take 300 mg by mouth daily. 03/26/18  Yes [provider]  cetirizine (ZYRTEC) 10 MG tablet TAKE ONE TABLET (10 MG) BY MOUTH EVERY DAY Patient taking differently: Take 10 mg by mouth daily.  10/18/17  Yes Charlott Rakes, MD  diclofenac  sodium (VOLTAREN) 1 % GEL Apply 2 g topically 4 (four) times daily. 08/20/18  Yes Caccavale, Sophia, PA-C  fluticasone (FLONASE) 50 MCG/ACT nasal spray Place 1-2 sprays into both nostrils daily for 7 days. 01/23/18 07/21/27 Yes Wieters, Hallie C, PA-C  gabapentin (NEURONTIN) 300 MG capsule Take 1 capsule (300 mg total) by mouth 2 (two) times daily. Patient taking differently: Take 300 mg by mouth 2 (two) times daily as needed (pain).  03/08/18  Yes Ladell Pier, MD  Insulin Glargine (LANTUS SOLOSTAR) 100 UNIT/ML Solostar Pen Inject 48 Units into the skin 2 (two) times daily. Patient taking differently: Inject 52 Units into the skin 2 (two) times daily.  02/06/18  Yes Charlott Rakes, MD  ketoconazole (NIZORAL) 2 % cream Apply 1 application topically 2 (two) times daily. 03/27/17  Yes Robyn Haber, MD  liraglutide (VICTOZA) 18 MG/3ML SOPN Inject 0.3 mLs (1.8 mg total) into the skin daily. 02/06/18  Yes Charlott Rakes, MD  lisinopril (PRINIVIL,ZESTRIL) 5 MG tablet Take 1 tablet (5 mg total) by mouth daily. 02/06/18  Yes Charlott Rakes, MD  megestrol (MEGACE) 40 MG tablet Take 1 tablet (40 mg total) by mouth daily. 07/21/18  Yes Caccavale, Sophia, PA-C  metFORMIN (GLUCOPHAGE-XR) 500 MG 24 hr tablet Take 2 tablets (1,000 mg total) by mouth 2 (two) times daily. 02/06/18  Yes Charlott Rakes, MD  methocarbamol (ROBAXIN) 500 MG tablet Take 2 tablets (1,000 mg total) by mouth 3 (three) times daily. X 10 days then prn muscle spasm 11/07/17  Yes McClung, Angela M, PA-C  metoCLOPramide (REGLAN) 5 MG tablet TAKE ONE TABLET BY MOUTH EVERY DAY Patient taking differently: Take 5 mg by mouth daily.  06/07/17  Yes Pyrtle, Lajuan Lines, MD  naproxen (NAPROSYN) 500 MG tablet Take 1 tablet (500 mg total) by mouth 2 (two) times daily with a meal. 07/21/18  Yes Caccavale, Sophia, PA-C  pantoprazole (PROTONIX) 40 MG tablet Take 1 tablet (40 mg total) by mouth daily. 02/06/18  Yes Charlott Rakes, MD  traZODone (DESYREL) 50 MG  tablet Take 25-50 mg by mouth at bedtime as needed for sleep.   Yes [provider]  ACCU-CHEK FASTCLIX LANCETS MISC Use as directed 02/06/18   Charlott Rakes, MD  Blood Glucose Monitoring Suppl (ACCU-CHEK GUIDE ME) w/Device KIT 48 Units by Does not apply route 2 (two) times daily. 02/06/18   Charlott Rakes, MD  diclofenac (VOLTAREN) 75 MG EC tablet Take 1 tablet (75 mg total) by mouth 2 (two) times daily. Patient not taking: Reported on 08/29/2018 02/06/18   Charlott Rakes, MD  glucose blood (ACCU-CHEK GUIDE) test strip Use as instructed 08/29/18   Suella Broad A, PA-C  Insulin Pen Needle 31G  X 8 MM MISC Use as directed for 4 times daily insulin administration. 11/07/16   Tresa Garter, MD  Insulin Syringe-Needle U-100 (BD INSULIN SYRINGE ULTRAFINE) 31G X 15/64" 0.5 ML MISC Use as directed 08/11/15   Tresa Garter, MD  misoprostol (CYTOTEC) 100 MCG tablet TK 6 TS PO THE NIGHT BEFORE BIOPSY 08/06/18   [provider]  misoprostol (CYTOTEC) 200 MCG tablet Take 3 pills ORALLY the night before the biopsy. Patient not taking: Reported on 08/19/2018 08/06/18   Emily Filbert, MD  Na Sulfate-K Sulfate-Mg Sulf 17.5-3.13-1.6 GM/177ML SOLN Suprep (no substitutions)-TAKE AS DIRECTED. Patient not taking: Reported on 08/29/2018 08/19/18   Pyrtle, Lajuan Lines, MD  NEEDLE, DISP, 24 G (B-D DISP NEEDLE 24GX1") 24G X 1" MISC Use with solostar pen to inject 4 times daily 07/09/14   Tresa Garter, MD  [facility] powder APPLY TOPICALLY TO THE AFFECTED AREA(S) TWICE DAILY Patient not taking: Reported on 07/21/2018 07/26/17   Charlott Rakes, MD  triamcinolone cream (KENALOG) 0.1 % Apply 1 application topically 2 (two) times daily. Patient not taking: Reported on 08/29/2018 01/18/17   Argentina Donovan, PA-C  Vitamin D, Ergocalciferol, (DRISDOL) 50000 units CAPS capsule Take 1 capsule (50,000 Units total) by mouth every 7 (seven) days. Patient not taking: Reported on 08/29/2018 06/21/17   Charlott Rakes,  MD    Family History Family History  Problem Relation Age of Onset   Diabetes Mother    Stroke Mother    Hypertension Mother    Breast cancer Sister    Heart attack Sister    Other Daughter        died at age 41   Cirrhosis Father    Colon cancer Maternal Uncle    Colon polyps Neg Hx    Esophageal cancer Neg Hx    Stomach cancer Neg Hx    Rectal cancer Neg Hx     Social History Social History   Tobacco Use   Smoking status: Current Every Day Smoker    Packs/day: 0.25    Types: Cigarettes    Last attempt to quit: 08/01/2016    Years since quitting: 2.0   Smokeless tobacco: Never Used  Substance Use Topics   Alcohol use: No    Alcohol/week: 0.0 standard drinks   Drug use: No     Allergies   Patient has no known allergies.   Review of Systems Review of Systems  Constitutional: Negative for chills and fever.  Respiratory: Negative for shortness of breath.   Cardiovascular: Negative for chest pain.  Gastrointestinal: Positive for abdominal pain. Negative for constipation, diarrhea, nausea and vomiting.  Genitourinary: Positive for vaginal bleeding. Negative for dysuria, frequency and urgency.  Musculoskeletal: Positive for back pain. Negative for gait problem, neck pain and neck stiffness.  Skin: Negative for rash and wound.  Allergic/Immunologic: Positive for immunocompromised state.  Neurological: Negative for dizziness and weakness.  Psychiatric/Behavioral: Negative for confusion.  All other systems reviewed and are negative.    Physical Exam Updated Vital Signs BP 102/72    Pulse 78    Temp 98.2 F (36.8 C) (Oral)    Resp 17    LMP 01/23/2014 (Approximate) Comment: neg PREG TEST 07/18/18   SpO2 99%   Physical Exam Vitals signs and nursing note reviewed.  Constitutional:      General: She is not in acute distress.    Appearance: She is well-developed. She is obese. She is not diaphoretic.  HENT:     Head:  Normocephalic and atraumatic.    Cardiovascular:     Rate and Rhythm: Normal rate and regular rhythm.     Heart sounds: Normal heart sounds.  Pulmonary:     Effort: Pulmonary effort is normal.     Breath sounds: Normal breath sounds.  Abdominal:     Palpations: Abdomen is soft.     Tenderness: There is abdominal tenderness in the right upper quadrant and right lower quadrant.  Musculoskeletal:     Lumbar back: She exhibits tenderness. She exhibits no bony tenderness.       Back:  Skin:    General: Skin is warm and dry.  Neurological:     Mental Status: She is alert and oriented to person, place, and time.  Psychiatric:        Behavior: Behavior normal.      ED Treatments / Results  Labs (all labs ordered are listed, but only abnormal results are displayed) Labs Reviewed  LIPASE, BLOOD - Abnormal; Notable for the following components:      Result Value   Lipase 56 (*)    All other components within normal limits  COMPREHENSIVE METABOLIC PANEL - Abnormal; Notable for the following components:   Sodium 130 (*)    Chloride 94 (*)    Glucose, Bld 412 (*)    Total Protein 8.6 (*)    ALT 58 (*)    Alkaline Phosphatase 193 (*)    All other components within normal limits  CBC - Abnormal; Notable for the following components:   RBC 5.47 (*)    Hemoglobin 15.1 (*)    HCT 46.8 (*)    All other components within normal limits  URINALYSIS, ROUTINE W REFLEX MICROSCOPIC - Abnormal; Notable for the following components:   Glucose, UA >=500 (*)    Hgb urine dipstick SMALL (*)    All other components within normal limits  CBG MONITORING, ED - Abnormal; Notable for the following components:   Glucose-Capillary 312 (*)    All other components within normal limits  I-STAT BETA HCG BLOOD, ED ([facility], [facility], [facility] ONLY)    EKG None  Radiology Ct Abdomen Pelvis W Contrast  Result Date: 08/29/2018 CLINICAL DATA:  53 year old female with abdominal pain. Concern for acute appendicitis. EXAM: CT ABDOMEN AND PELVIS WITH  CONTRAST TECHNIQUE: Multidetector CT imaging of the abdomen and pelvis was performed using the standard protocol following bolus administration of intravenous contrast. CONTRAST:  133m OMNIPAQUE IOHEXOL 300 MG/ML  SOLN COMPARISON:  CT of the abdomen pelvis dated 07/18/2018 FINDINGS: Lower chest: The visualized lung bases are clear. No intra-abdominal free air or free fluid. Hepatobiliary: Diffuse moderate to severe fatty infiltration of the liver. The liver is enlarged measuring approximately 22 cm in midclavicular length. Correlation with LFTs recommended to evaluate for steatohepatitis. Mild irregularity of the liver contour may represent early changes of cirrhosis. No intrahepatic biliary ductal dilatation. Cholecystectomy. Pancreas: Unremarkable. No pancreatic ductal dilatation or surrounding inflammatory changes. Spleen: Normal in size without focal abnormality. Adrenals/Urinary Tract: The adrenal glands are unremarkable. There is no hydronephrosis on either side. Subcentimeter right renal inferior pole hypodense lesion is too small to characterize. There is cortical irregularity and scarring of the inferior pole of the right kidney. The visualized ureters and urinary bladder appear unremarkable. Stomach/Bowel: There is moderate stool throughout the colon. There is no bowel obstruction or active inflammation. Normal appendix. Vascular/Lymphatic: The abdominal aorta and IVC are unremarkable. No portal venous gas. There is no adenopathy. Reproductive: The uterus is  anteverted and grossly unremarkable. No adnexal masses. Other: None Musculoskeletal: Degenerative changes of the spine. No acute osseous pathology. IMPRESSION: 1. No acute intra-abdominal or pelvic pathology. No bowel obstruction or active inflammation. Normal appendix. 2. Enlarged fatty liver with findings suggestive of possible steatohepatitis or early cirrhosis. Correlation with LFTs recommended. Electronically Signed   By: Anner Crete M.D.    On: 08/29/2018 18:48    Procedures Procedures (including critical care time)  Medications Ordered in ED Medications  sodium chloride 0.9 % bolus 1,000 mL (0 mLs Intravenous Stopped 08/29/18 1905)  insulin aspart (novoLOG) injection 8 Units (8 Units Subcutaneous Given 08/29/18 1759)  sodium chloride (PF) 0.9 % injection (  Contrast Given 08/29/18 1849)  iohexol (OMNIPAQUE) 300 MG/ML solution 100 mL (100 mLs Intravenous Contrast Given 08/29/18 1823)     Initial Impression / Assessment and Plan / ED Course  I have reviewed the triage vital signs and the nursing notes.  Pertinent labs & imaging results that were available during my care of the patient were reviewed by me and considered in my medical decision making (see chart for details).  Clinical Course as of Aug 29 44  Thu Aug 29, 2018  2000 53yo female   [LM]  Fri Aug 21, [address]  [id number] 53 year old female presents with complaint of right-sided abdominal pain.  On exam found to have tenderness in the right lower quadrant also right lower back pain is reproduced with palpation. Review of lab work, CBC without significant changes, lipase 56, not concerning for acute pancreatitis, urinalysis with small hemoglobin and greater than 500 glucose, CMP with glucose of 412, improved to 300 8:12 units of insulin and 1 L IV fluids.  ALT mildly elevated at 58, alk phos of 193.  Prior abdominal surgeries include cholecystectomy.  CT abdomen pelvis with contrast shows possible hepatic steatosis, appendix normal. Patient was given Lidoderm patch for her low back pain, advised to follow-up with her providers as scheduled in the coming days.  Return to emergency room for new or worsening symptoms.  Patient verbalized understanding of plan of care.   [LM]    Clinical Course User Index [LM] Tacy Learn, PA-C      Final Clinical Impressions(s) / ED Diagnoses   Final diagnoses:  Generalized abdominal pain  Hyperglycemia  Acute right-sided low back  pain without sciatica    ED Discharge Orders         Ordered    glucose blood (ACCU-CHEK GUIDE) test strip     08/29/18 1942           Tacy Learn, PA-C 08/30/18 0046    Isla Pence, MD 09/03/18 [phone]

## 2018-08-30 ENCOUNTER — Telehealth: Payer: Self-pay | Admitting: Internal Medicine

## 2018-08-30 NOTE — Telephone Encounter (Signed)

## 2018-09-02 ENCOUNTER — Other Ambulatory Visit: Payer: Self-pay

## 2018-09-02 ENCOUNTER — Encounter: Payer: Self-pay | Admitting: Internal Medicine

## 2018-09-02 ENCOUNTER — Ambulatory Visit (AMBULATORY_SURGERY_CENTER): Payer: Medicaid Other | Admitting: Internal Medicine

## 2018-09-02 VITALS — BP 124/81 | HR 88 | Temp 97.3°F | Resp 16 | Ht 66.0 in | Wt 290.0 lb

## 2018-09-02 DIAGNOSIS — Z86010 Personal history of colonic polyps: Secondary | ICD-10-CM

## 2018-09-02 DIAGNOSIS — D125 Benign neoplasm of sigmoid colon: Secondary | ICD-10-CM

## 2018-09-02 DIAGNOSIS — D123 Benign neoplasm of transverse colon: Secondary | ICD-10-CM

## 2018-09-02 DIAGNOSIS — D124 Benign neoplasm of descending colon: Secondary | ICD-10-CM | POA: Diagnosis not present

## 2018-09-02 MED ORDER — SODIUM CHLORIDE 0.9 % IV SOLN: 500.0000 mL | Freq: Once | INTRAVENOUS | Status: DC

## 2018-09-02 NOTE — Op Note (Signed)
[facility] Patient Name: Tara Keller Procedure Date: 09/02/2018 10:54 AM MRN: [id number] Endoscopist: Jerene Bears , MD Age: 53 Referring MD:  Date of Birth: 05-02-1965 Gender: Female Account #: [id number] Procedure:                Colonoscopy Indications:              Surveillance: Personal history of adenomatous                            polyps on last colonoscopy 5 years ago Medicines:                Monitored Anesthesia Care Procedure:                Pre-Anesthesia Assessment:                           - Prior to the procedure, a History and Physical                            was performed, and patient medications and                            allergies were reviewed. The patient's tolerance of                            previous anesthesia was also reviewed. The risks                            and benefits of the procedure and the sedation                            options and risks were discussed with the patient.                            All questions were answered, and informed consent                            was obtained. Prior Anticoagulants: The patient has                            taken no previous anticoagulant or antiplatelet                            agents. ASA Grade Assessment: III - A patient with                            severe systemic disease. After reviewing the risks                            and benefits, the patient was deemed in                            satisfactory condition to undergo the procedure.  After obtaining informed consent, the colonoscope                            was passed under direct vision. Throughout the                            procedure, the patient's blood pressure, pulse, and                            oxygen saturations were monitored continuously. The                            Colonoscope was introduced through the anus and                            advanced to the cecum,  identified by appendiceal                            orifice and ileocecal valve. The colonoscopy was                            performed without difficulty. The patient tolerated                            the procedure well. The quality of the bowel                            preparation was good. The ileocecal valve,                            appendiceal orifice, and rectum were photographed. Scope In: 11:00:41 AM Scope Out: 11:21:14 AM Scope Withdrawal Time: 0 hours 15 minutes 34 seconds  Total Procedure Duration: 0 hours 20 minutes 33 seconds  Findings:                 The digital rectal exam was normal.                           A 5 mm polyp was found in the transverse colon. The                            polyp was sessile. The polyp was removed with a                            cold snare. Resection and retrieval were complete.                           Two sessile polyps were found in the descending                            colon. The polyps were 5 to 6 mm in size. These  polyps were removed with a cold snare. Resection                            and retrieval were complete.                           A 5 mm polyp was found in the sigmoid colon. The                            polyp was sessile. The polyp was removed with a                            cold snare. Resection and retrieval were complete.                           Internal hemorrhoids were found during                            retroflexion. The hemorrhoids were small. Complications:            No immediate complications. Estimated Blood Loss:     Estimated blood loss was minimal. Impression:               - One 5 mm polyp in the transverse colon, removed                            with a cold snare. Resected and retrieved.                           - Two 5 to 6 mm polyps in the descending colon,                            removed with a cold snare. Resected and retrieved.                            - One 5 mm polyp in the sigmoid colon, removed with                            a cold snare. Resected and retrieved.                           - Small internal hemorrhoids. Recommendation:           - Patient has a contact number available for                            emergencies. The signs and symptoms of potential                            delayed complications were discussed with the                            patient. Return to normal activities tomorrow.  Written discharge instructions were provided to the                            patient.                           - Resume previous diet.                           - Continue present medications.                           - Await pathology results.                           - Repeat colonoscopy is recommended for                            surveillance. The colonoscopy date will be                            determined after pathology results from today's                            exam become available for review. Jerene Bears, MD 09/02/2018 11:25:18 AM This report has been signed electronically.

## 2018-09-02 NOTE — Progress Notes (Signed)
A and O x3. Report to RN. Tolerated MAC anesthesia well.

## 2018-09-02 NOTE — Progress Notes (Signed)
Called to room to assist during endoscopic procedure.  Patient ID and intended procedure confirmed with present staff. Received instructions for my participation in the procedure from the performing physician.  

## 2018-09-02 NOTE — Patient Instructions (Signed)
Information on polyps given to you today.  Await pathology results.   YOU HAD AN ENDOSCOPIC PROCEDURE TODAY AT THE [facility]:   Refer to the procedure report that was given to you for any specific questions about what was found during the examination.  If the procedure report does not answer your questions, please call your gastroenterologist to clarify.  If you requested that your care partner not be given the details of your procedure findings, then the procedure report has been included in a sealed envelope for you to review at your convenience later.  YOU SHOULD EXPECT: Some feelings of bloating in the abdomen. Passage of more gas than usual.  Walking can help get rid of the air that was put into your GI tract during the procedure and reduce the bloating. If you had a lower endoscopy (such as a colonoscopy or flexible sigmoidoscopy) you may notice spotting of blood in your stool or on the toilet paper. If you underwent a bowel prep for your procedure, you may not have a normal bowel movement for a few days.  Please Note:  You might notice some irritation and congestion in your nose or some drainage.  This is from the oxygen used during your procedure.  There is no need for concern and it should clear up in a day or so.  SYMPTOMS TO REPORT IMMEDIATELY:   Following lower endoscopy (colonoscopy or flexible sigmoidoscopy):  Excessive amounts of blood in the stool  Significant tenderness or worsening of abdominal pains  Swelling of the abdomen that is new, acute  Fever of 100F or higher   For urgent or emergent issues, a gastroenterologist can be reached at any hour by calling [phone].   DIET:  We do recommend a small meal at first, but then you may proceed to your regular diet.  Drink plenty of fluids but you should avoid alcoholic beverages for 24 hours.  ACTIVITY:  You should plan to take it easy for the rest of today and you should NOT DRIVE or use heavy machinery  until tomorrow (because of the sedation medicines used during the test).    FOLLOW UP: Our staff will call the number listed on your records 48-72 hours following your procedure to check on you and address any questions or concerns that you may have regarding the information given to you following your procedure. If we do not reach you, we will leave a message.  We will attempt to reach you two times.  During this call, we will ask if you have developed any symptoms of COVID 19. If you develop any symptoms (ie: fever, flu-like symptoms, shortness of breath, cough etc.) before then, please call [phone].  If you test positive for Covid 19 in the 2 weeks post procedure, please call and report this information to us.    If any biopsies were taken you will be contacted by phone or by letter within the next 1-3 weeks.  Please call us at [phone] if you have not heard about the biopsies in 3 weeks.    SIGNATURES/CONFIDENTIALITY: You and/or your care partner have signed paperwork which will be entered into your electronic medical record.  These signatures attest to the fact that that the information above on your After Visit Summary has been reviewed and is understood.  Full responsibility of the confidentiality of this discharge information lies with you and/or your care-partner. 

## 2018-09-02 NOTE — Progress Notes (Signed)
[name]  Vital signs-courtney washington.  Pt. cbg =310 she stated "I usually run as high as the 400's sometimes,she denies any s/s of hyperglycemia,values reported to doctor no new orders received.

## 2018-09-04 ENCOUNTER — Telehealth: Payer: Self-pay | Admitting: *Deleted

## 2018-09-04 NOTE — Telephone Encounter (Signed)
  Follow up Call-  Call back number 09/02/2018  Post procedure Call Back phone  # [phone]  Permission to leave phone message Yes  Some recent data might be hidden     Patient questions:  Do you have a fever, pain , or abdominal swelling? No. Pain Score  0 *  Have you tolerated food without any problems? Yes.    Have you been able to return to your normal activities? Yes.    Do you have any questions about your discharge instructions: Diet   No. Medications  No. Follow up visit  No.  Do you have questions or concerns about your Care? No.  Actions: * If pain score is 4 or above: No action needed, pain <4.  1. Have you developed a fever since your procedure? no  2.   Have you had an respiratory symptoms (SOB or cough) since your procedure? no  3.   Have you tested positive for COVID 19 since your procedure no  4.   Have you had any family members/close contacts diagnosed with the COVID 19 since your procedure?  no   If yes to any of these questions please route to Joylene John, RN and Alphonsa Gin, Therapist, sports.

## 2018-09-05 ENCOUNTER — Encounter: Payer: Self-pay | Admitting: Internal Medicine

## 2018-09-08 NOTE — Progress Notes (Signed)
Consult Note: Gyn-Onc  Tara Keller 53 y.o. female  CC:  Chief Complaint  Patient presents with  . Follow-up    HPI: Patient seen today in consultation at the request of Dr. Hulan Fray.  Patient is a 53 year old gravida 3 para 4 who is been menopausal since the age of 53.  She states that she fell on July 1 and about 4 to 5 days after that she started having some vaginal bleeding.  She went and had an endometrial biopsy with the findings as below.  She continues to have a little bit of spotting and bleeding.  She does complain of some right-sided and back pain since her fall.  Fortunately her CT scan as below was atraumatic.  She denies any change in her bowel or bladder habits.  She is visibly short of breath wearing a mask you in clinic in a sitting position.  As we discussed that she states that she gets short of breath with walking can only walk half a block.  She does carry a diagnosis of sleep apnea and she is not been using her CPAP machine as it was stolen.  She states her fianc does not sleep well at night secondary to her snoring and he is also fearful of falling asleep because she does stop breathing.  She currently is smoking 7 cigarettes a day.  She has been trying to get in with a new primary care provider.  She is not sure what her last hemoglobin A1c was.  When she was in the emergency room in August her glucose was 4102 and prior to that it was over 500.  She does not walk very much secondary to arthritis pain in her left leg.  Endometrial biopsy 08/21/18: Diagnosis Endometrium, biopsy - ENDOMETRIOID CARCINOMA, FIGO GRADE 1.  CT 08/2018: IMPRESSION: 1. No acute intra-abdominal or pelvic pathology. No bowel obstruction or active inflammation. Normal appendix. 2. Enlarged fatty liver with findings suggestive of possible steatohepatitis or early cirrhosis. Correlation with LFTs recommended.  Colonoscopy 09/02/18: Diagnosis 1. Surgical [P], colon, transverse, polyp - BENIGN COLONIC  MUCOSA (1 OF 1 FRAGMENTS) - NO HIGH GRADE DYSPLASIA OR MALIGNANCY IDENTIFIED 2. Surgical [P], colon, descending, polyp (2) - TUBULAR ADENOMA (2 OF 3 FRAGMENTS) - BENIGN COLONIC MUCOSA (1 OF 3 FRAGMENTS) - NO HIGH GRADE DYSPLASIA OR MALIGNANCY IDENTIFIED 3. Surgical [P], colon, sigmoid, polyp - TUBULAR ADENOMA (1 OF 1 FRAGMENTS) - NO HIGH GRADE DYSPLASIA OR MALIGNANCY IDENTIFIED  Review of Systems: Constitutional: Complaining of back and right-sided pain status post fall Skin: No rash Cardiovascular: No chest pain, + shortness of breath which makes that she is not able to walk up a flight of stairs.  She states that most she can walk about half a block without getting short of breath, or edema  Pulmonary: +cough  Gastro Intestinal: Reporting intermittent lower abdominal soreness.  No nausea, vomiting, constipation, or diarrhea reported.  Genitourinary: + vaginal bleeding  Musculoskeletal: Complaining of back and left leg pain Neurologic: No complaints  Current Meds:  Outpatient Encounter Medications as of 09/10/2018  Medication Sig  . ACCU-CHEK FASTCLIX LANCETS MISC Use as directed  . albuterol (PROVENTIL HFA;VENTOLIN HFA) 108 (90 Base) MCG/ACT inhaler Inhale 1-2 puffs into the lungs every 6 (six) hours as needed for wheezing or shortness of breath.  [name] aspirin EC 81 MG tablet Take 1 tablet (81 mg total) by mouth daily.  [name] atorvastatin (LIPITOR) 20 MG tablet Take 1 tablet (20 mg total) by mouth daily.  [name]  benztropine (COGENTIN) 0.5 MG tablet Take 0.5 mg by mouth at bedtime.  . Blood Glucose Monitoring Suppl (ACCU-CHEK GUIDE ME) w/Device KIT 48 Units by Does not apply route 2 (two) times daily.  [name] buPROPion (WELLBUTRIN XL) 300 MG 24 hr tablet Take 300 mg by mouth daily.  . cetirizine (ZYRTEC) 10 MG tablet TAKE ONE TABLET (10 MG) BY MOUTH EVERY DAY (Patient taking differently: Take 10 mg by mouth daily. )  . diclofenac sodium (VOLTAREN) 1 % GEL Apply 2 g topically 4 (four) times daily.  .  fluticasone (FLONASE) 50 MCG/ACT nasal spray Place 1-2 sprays into both nostrils daily for 7 days.  [name] gabapentin (NEURONTIN) 300 MG capsule Take 1 capsule (300 mg total) by mouth 2 (two) times daily. (Patient taking differently: Take 300 mg by mouth 2 (two) times daily as needed (pain). )  . glucose blood (ACCU-CHEK GUIDE) test strip Use as instructed  . Insulin Glargine (LANTUS SOLOSTAR) 100 UNIT/ML Solostar Pen Inject 48 Units into the skin 2 (two) times daily. (Patient taking differently: Inject 52 Units into the skin 2 (two) times daily. )  . Insulin Pen Needle 31G X 8 MM MISC Use as directed for 4 times daily insulin administration.  . Insulin Syringe-Needle U-100 (BD INSULIN SYRINGE ULTRAFINE) 31G X 15/64" 0.5 ML MISC Use as directed  . ketoconazole (NIZORAL) 2 % cream Apply 1 application topically 2 (two) times daily.  [name] liraglutide (VICTOZA) 18 MG/3ML SOPN Inject 0.3 mLs (1.8 mg total) into the skin daily.  [name] lisinopril (PRINIVIL,ZESTRIL) 5 MG tablet Take 1 tablet (5 mg total) by mouth daily.  . megestrol (MEGACE) 40 MG tablet Take 1 tablet (40 mg total) by mouth daily.  . metFORMIN (GLUCOPHAGE-XR) 500 MG 24 hr tablet Take 2 tablets (1,000 mg total) by mouth 2 (two) times daily.  . methocarbamol (ROBAXIN) 500 MG tablet Take 2 tablets (1,000 mg total) by mouth 3 (three) times daily. X 10 days then prn muscle spasm  . naproxen (NAPROSYN) 500 MG tablet Take 1 tablet (500 mg total) by mouth 2 (two) times daily with a meal.  . NEEDLE, DISP, 24 G (B-D DISP NEEDLE 24GX1") 24G X 1" MISC Use with solostar pen to inject 4 times daily  . pantoprazole (PROTONIX) 40 MG tablet Take 1 tablet (40 mg total) by mouth daily.  . traZODone (DESYREL) 50 MG tablet Take 25-50 mg by mouth at bedtime as needed for sleep.  . [DISCONTINUED] diclofenac (VOLTAREN) 75 MG EC tablet Take 1 tablet (75 mg total) by mouth 2 (two) times daily. (Patient not taking: Reported on 08/29/2018)  . [DISCONTINUED] metoCLOPramide (REGLAN)  5 MG tablet TAKE ONE TABLET BY MOUTH EVERY DAY (Patient taking differently: Take 5 mg by mouth daily. )  . [DISCONTINUED] NYAMYC powder APPLY TOPICALLY TO THE AFFECTED AREA(S) TWICE DAILY (Patient not taking: Reported on 07/21/2018)  . [DISCONTINUED] triamcinolone cream (KENALOG) 0.1 % Apply 1 application topically 2 (two) times daily. (Patient not taking: Reported on 08/29/2018)  . [DISCONTINUED] Vitamin D, Ergocalciferol, (DRISDOL) 50000 units CAPS capsule Take 1 capsule (50,000 Units total) by mouth every 7 (seven) days. (Patient not taking: Reported on 08/29/2018)   No facility-administered encounter medications on file as of 09/10/2018.     Allergy: No Known Allergies  Social Hx:   Social History   Socioeconomic History  . Marital status: Single    Spouse name: Not on file  . Number of children: 4  . Years of education: Not on file  .  Highest education level: Not on file  Occupational History  . Occupation: disabled  Social Needs  . Financial resource strain: Not on file  . Food insecurity    Worry: Not on file    Inability: Not on file  . Transportation needs    Medical: Not on file    Non-medical: Not on file  Tobacco Use  . Smoking status: Current Every Day Smoker    Packs/day: 0.25    Types: Cigarettes    Last attempt to quit: 08/01/2016    Years since quitting: 2.1  . Smokeless tobacco: Never Used  . Tobacco comment: started back smoking 09/02/18  Substance and Sexual Activity  . Alcohol use: No    Alcohol/week: 0.0 standard drinks  . Drug use: No  . Sexual activity: Not on file  Lifestyle  . Physical activity    Days per week: Not on file    Minutes per session: Not on file  . Stress: Not on file  Relationships  . Social Herbalist on phone: Not on file    Gets together: Not on file    Attends religious service: Not on file    Active member of club or organization: Not on file    Attends meetings of clubs or organizations: Not on file     Relationship status: Not on file  . Intimate partner violence    Fear of current or ex partner: Not on file    Emotionally abused: Not on file    Physically abused: Not on file    Forced sexual activity: Not on file  Other Topics Concern  . Not on file  Social History Narrative   She lives with fiance.  Her daughter died in March 24, 2015 at the age of 72.   She is on disability since 1990/03/23   Highest level of education:  Working on [employer]    Past Surgical Hx:  Past Surgical History:  Procedure Laterality Date  . CHOLECYSTECTOMY    . COLONOSCOPY WITH PROPOFOL N/A 08/15/2013   Procedure: COLONOSCOPY WITH PROPOFOL;  Surgeon: Jerene Bears, MD;  Location: [facility];  Service: Gastroenterology;  Laterality: N/A;  . ESOPHAGOGASTRODUODENOSCOPY N/A 05/12/2014   Procedure: ESOPHAGOGASTRODUODENOSCOPY (EGD);  Surgeon: Jerene Bears, MD;  Location: [facility];  Service: Gastroenterology;  Laterality: N/A;  . ESOPHAGOGASTRODUODENOSCOPY (EGD) WITH PROPOFOL N/A 08/15/2013   Procedure: ESOPHAGOGASTRODUODENOSCOPY (EGD) WITH PROPOFOL;  Surgeon: Jerene Bears, MD;  Location: [facility];  Service: Gastroenterology;  Laterality: N/A;  . TUBAL LIGATION Bilateral     Past Medical Hx:  Past Medical History:  Diagnosis Date  . Allergy   . Anxiety   . Arthritis   . Asthma   . Cancer ([place])    cervical cancer dx 3 weeks ago  . Depression   . Diabetes mellitus   . Fatty liver   . Fracture closed of upper end of forearm 9 years, Fx left left leg  . Fracture of left lower leg @ 53 years old  . H/O tubal ligation   . Hiatal hernia   . Hyperlipidemia   . Hypertension   . Obesity   . Positive H. pylori test   . Sleep apnea    not using CPAP  . Tubular adenoma of colon     Oncology Hx:  Oncology History  Endometrial ca [facility])  08/21/2018 Initial Diagnosis   Endometrial ca [facility])     Family Hx:  Family History  Problem Relation Age of Onset  .  Diabetes Mother   . Stroke Mother   . Hypertension Mother   .  Breast cancer Sister   . Heart attack Sister   . Other Daughter        died at age 51  . Cirrhosis Father   . Colon cancer Maternal Uncle   . Prostate cancer Maternal Uncle        75  . Stomach cancer Maternal Aunt        70  . Colon polyps Neg Hx   . Esophageal cancer Neg Hx   . Rectal cancer Neg Hx     Vitals:  Blood pressure 117/65, pulse 82, temperature 98.2 F (36.8 C), temperature source Temporal, resp. rate 20, height 5' 6"  (1.676 m), weight 298 lb 5 oz (135.3 kg), last menstrual period 01/23/2014, SpO2 100 %.  Physical Exam: Well-nourished well-developed female she is visibly short of breath with a mask in no acute distress  Neck: Supple, no lymphadenopathy, no thyromegaly.  Abdomen: Morbidly obese.  Large pannus overhanging to the thigh.  Well-healed cesarean section incisions.  Exam is limited by habitus.  Groins: No lymphadenopathy.  Extremities: No edema.  Pelvic: External genitalia within normal limits.  The vagina is atrophic.  The cervix is visualized that is multiparous.  There is a thin bloody discharge.  Bimanual examination reveals a palpably normal cervix.  I cannot appreciate the size of the uterus or any other adnexal masses.  Exam is limited by habitus.  Assessment/Plan: 53 year old with a clinical diagnosis of a stage I grade 1 endometrioid adenocarcinoma. A detailed discussion was held with the patient with regard to to her endometrial cancer diagnosis.  Her daughter was on the cell phone as we discussed things.  I think that she needs to be medically optimized before being able to move forward with surgery.  She also needs a new primary care physician.  We are calling [facility] at [phone] to get her an appointment with her new primary care provider.  I think she needs to get a new CPAP machine and start managing her sleep apnea.  In addition, we discussed smoking cessation as well as a planned 30 pound weight loss.  Additionally, her diabetes is very poorly  controlled with blood sugars as high as 522.  We will check a hemoglobin A1c, CBC and chemistries today.    Our plan will be to resample her in approximately 3 months and if she is medically optimized then be able to move forward with definitive surgery.  We discussed her back pain that she has had since her fall in July.  Fortunately her CT scan was atraumatic.  She is only taking 1 Naprosyn one Tylenol a day.  We discussed that she can take Naprosyn 1 tablet twice a day and Tylenol 500 mg every 6 hours.  We appreciate the opportunity to part in the care of this very pleasant patient.   Icelyn Navarrete A., MD 09/10/2018, 1:14 PM

## 2018-09-10 ENCOUNTER — Other Ambulatory Visit: Payer: Self-pay

## 2018-09-10 ENCOUNTER — Encounter: Payer: Self-pay | Admitting: Gynecologic Oncology

## 2018-09-10 ENCOUNTER — Inpatient Hospital Stay: Payer: Medicaid Other

## 2018-09-10 ENCOUNTER — Inpatient Hospital Stay: Payer: Medicaid Other | Attending: Gynecologic Oncology | Admitting: Gynecologic Oncology

## 2018-09-10 ENCOUNTER — Telehealth: Payer: Self-pay

## 2018-09-10 VITALS — BP 117/65 | HR 82 | Temp 98.2°F | Resp 20 | Ht 66.0 in | Wt 298.3 lb

## 2018-09-10 DIAGNOSIS — G4730 Sleep apnea, unspecified: Secondary | ICD-10-CM | POA: Diagnosis not present

## 2018-09-10 DIAGNOSIS — I10 Essential (primary) hypertension: Secondary | ICD-10-CM | POA: Diagnosis not present

## 2018-09-10 DIAGNOSIS — G893 Neoplasm related pain (acute) (chronic): Secondary | ICD-10-CM | POA: Diagnosis not present

## 2018-09-10 DIAGNOSIS — C541 Malignant neoplasm of endometrium: Secondary | ICD-10-CM

## 2018-09-10 DIAGNOSIS — Z7982 Long term (current) use of aspirin: Secondary | ICD-10-CM | POA: Insufficient documentation

## 2018-09-10 DIAGNOSIS — E1165 Type 2 diabetes mellitus with hyperglycemia: Secondary | ICD-10-CM | POA: Diagnosis not present

## 2018-09-10 DIAGNOSIS — F17210 Nicotine dependence, cigarettes, uncomplicated: Secondary | ICD-10-CM | POA: Insufficient documentation

## 2018-09-10 DIAGNOSIS — M1990 Unspecified osteoarthritis, unspecified site: Secondary | ICD-10-CM | POA: Insufficient documentation

## 2018-09-10 DIAGNOSIS — Z791 Long term (current) use of non-steroidal anti-inflammatories (NSAID): Secondary | ICD-10-CM | POA: Diagnosis not present

## 2018-09-10 DIAGNOSIS — E669 Obesity, unspecified: Secondary | ICD-10-CM | POA: Diagnosis not present

## 2018-09-10 DIAGNOSIS — Z794 Long term (current) use of insulin: Secondary | ICD-10-CM | POA: Diagnosis not present

## 2018-09-10 DIAGNOSIS — E119 Type 2 diabetes mellitus without complications: Secondary | ICD-10-CM | POA: Insufficient documentation

## 2018-09-10 DIAGNOSIS — Z79899 Other long term (current) drug therapy: Secondary | ICD-10-CM | POA: Insufficient documentation

## 2018-09-10 DIAGNOSIS — E785 Hyperlipidemia, unspecified: Secondary | ICD-10-CM | POA: Insufficient documentation

## 2018-09-10 DIAGNOSIS — Z7984 Long term (current) use of oral hypoglycemic drugs: Secondary | ICD-10-CM | POA: Diagnosis not present

## 2018-09-10 DIAGNOSIS — J45909 Unspecified asthma, uncomplicated: Secondary | ICD-10-CM | POA: Insufficient documentation

## 2018-09-10 LAB — CBC WITH DIFFERENTIAL (CANCER CENTER ONLY)
Abs Immature Granulocytes: 0.04 10*3/uL (ref 0.00–0.07)
Basophils Absolute: 0 10*3/uL (ref 0.0–0.1)
Basophils Relative: 0 %
Eosinophils Absolute: 0.3 10*3/uL (ref 0.0–0.5)
Eosinophils Relative: 3 %
HCT: 40.2 % (ref 36.0–46.0)
Hemoglobin: 12.9 g/dL (ref 12.0–15.0)
Immature Granulocytes: 0 %
Lymphocytes Relative: 40 %
Lymphs Abs: 4 10*3/uL (ref 0.7–4.0)
MCH: 27.7 pg (ref 26.0–34.0)
MCHC: 32.1 g/dL (ref 30.0–36.0)
MCV: 86.5 fL (ref 80.0–100.0)
Monocytes Absolute: 0.8 10*3/uL (ref 0.1–1.0)
Monocytes Relative: 8 %
Neutro Abs: 4.8 10*3/uL (ref 1.7–7.7)
Neutrophils Relative %: 49 %
Platelet Count: 267 10*3/uL (ref 150–400)
RBC: 4.65 MIL/uL (ref 3.87–5.11)
RDW: 13.2 % (ref 11.5–15.5)
WBC Count: 10 10*3/uL (ref 4.0–10.5)
nRBC: 0 % (ref 0.0–0.2)

## 2018-09-10 LAB — COMPREHENSIVE METABOLIC PANEL
ALT: 32 U/L (ref 0–44)
AST: 19 U/L (ref 15–41)
Albumin: 3.2 g/dL — ABNORMAL LOW (ref 3.5–5.0)
Alkaline Phosphatase: 182 U/L — ABNORMAL HIGH (ref 38–126)
Anion gap: 9 (ref 5–15)
BUN: 9 mg/dL (ref 6–20)
CO2: 26 mmol/L (ref 22–32)
Calcium: 9.2 mg/dL (ref 8.9–10.3)
Chloride: 104 mmol/L (ref 98–111)
Creatinine, Ser: 0.78 mg/dL (ref 0.44–1.00)
GFR calc Af Amer: >60 mL/min (ref >60)
GFR calc non Af Amer: >60 mL/min (ref >60)
Glucose, Bld: 294 mg/dL — ABNORMAL HIGH (ref 70–99)
Potassium: 3.3 mmol/L — ABNORMAL LOW (ref 3.5–5.1)
Sodium: 139 mmol/L (ref 135–145)
Total Bilirubin: 0.2 mg/dL — ABNORMAL LOW (ref 0.3–1.2)
Total Protein: 7 g/dL (ref 6.5–8.1)

## 2018-09-10 LAB — HEMOGLOBIN A1C
Hgb A1c MFr Bld: 13.6 % — ABNORMAL HIGH (ref 4.8–5.6)
Mean Plasma Glucose: 343.62 mg/dL

## 2018-09-10 MED ORDER — LEVONORGESTREL 20 MCG/24HR IU IUD: 1.0000 | INTRAUTERINE_SYSTEM | Freq: Once | INTRAUTERINE | 0 refills | Status: DC

## 2018-09-10 MED FILL — MIRENA SYSTEM: 20 | 90 days supply | Qty: 1 | Fill #0

## 2018-09-10 NOTE — Telephone Encounter (Signed)
Told Tara Keller that the co-pay for the IUD is ~3 dollars. She can call [name] at [place] to pay over the phone. [phone]. Pt verbalized understanding.

## 2018-09-10 NOTE — Patient Instructions (Addendum)
Preparing for your Surgery  Plan for surgery on September 24, 2018 with Dr. Everitt Amber at [facility] will be scheduled for a dilation and curettage of the uterus with Mirena intrauterine device placement.  We will sent the Mirena prescription to our hospital pharmacy and see if there is a copay required and let you know.   Dr. Alycia Rossetti has recommended you take the naproxen twice daily and Tylenol 500 mg four times a day and use a heating pad for your back pain.  Pre-operative Testing -You will receive a phone call from presurgical testing at [facility] if you have not received a call already to arrange for a pre-operative testing appointment before your surgery.  This appointment normally occurs one to two weeks before your scheduled surgery.   -Bring your insurance card, copy of an advanced directive if applicable, medication list  -Do not take supplements such as fish oil (omega 3), red yeast rice, tumeric before your surgery.  Day Before Surgery at [place] will be advised to have nothing to eat or drink after midnight after a certain time at your preop appt.    Your role in recovery Your role is to become active as soon as directed by your doctor, while still giving yourself time to heal.  Rest when you feel tired. You will be asked to do the following in order to speed your recovery:  - Cough and breathe deeply. This helps toclear and expand your lungs and can prevent pneumonia. You may be given a spirometer to practice deep breathing. A staff member will show you how to use the spirometer. -Stay active. Walking or moving your legs help your circulation and body functions return to normal. - Actively manage your pain. Managing your pain lets you move in comfort. We will ask you to rate your pain on a scale of zero to 10. It is your responsibility to tell your doctor or nurse where and how much you hurt so your pain can be treated.  Special Considerations -If you  are diabetic, you may be placed on insulin after surgery to have closer control over your blood sugars to promote healing and recovery.  This does not mean that you will be discharged on insulin.  If applicable, your oral antidiabetics will be resumed when you are tolerating a solid diet.  -Your final pathology results from surgery should be available around one week after surgery and the results will be relayed to you when available.  -Dr. Lahoma Crocker is the surgeon that assists your GYN Oncologist with surgery.  If you end up staying the night, the next day after your surgery you will either see [name] or Dr. Lahoma Crocker.  -FMLA forms can be faxed to [phone] and please allow 5-7 business days for completion.   Intrauterine Device Insertion An intrauterine device (IUD) is a medical device that gets inserted into the uterus to prevent pregnancy. It is a small, T-shaped device that has one or two nylon strings hanging down from it. The strings hang out of the lower part of the uterus (cervix) to allow for future IUD removal. There are two types of IUDs available:  Copper IUD. This type of IUD has copper wire wrapped around it. Copper makes the uterus and fallopian tubes produce a fluid that kills sperm. A copper IUD may last up to 10 years.  Hormone IUD. This type of IUD is made of plastic and contains the hormone progestin (synthetic progesterone).  The hormone thickens mucus in the cervix and prevents sperm from entering the uterus. It also thins the uterine lining to prevent implantation of a fertilized egg. The hormone can weaken or kill the sperm that get into the uterus. A hormone IUD may last 3-5 years. Tell a health care provider about:  Any allergies you have.  All medicines you are taking, including vitamins, herbs, eye drops, creams, and over-the-counter medicines.  Any problems you or family members have had with anesthetic medicines.  Any blood disorders you  have.  Any surgeries you have had.  Any medical conditions you have, including any STIs (sexually transmitted infections) you may have.  Whether you are pregnant or may be pregnant. What are the risks? Generally, this is a safe procedure. However, problems may occur, including:  Infection.  Bleeding.  Allergic reactions to medicines.  Accidental puncture (perforation) of the uterus, or damage to other structures or organs.  Accidental placement of the IUD either in the muscle layer of the uterus (myometrium) or outside the uterus.  The IUD falling out of the uterus (expulsion). This is more common among women who have recently had a child.  Pregnancy that happens in the fallopian tube (ectopic pregnancy).  Infection of the uterus and fallopian tubes (pelvic inflammatory disease). What happens before the procedure?  Schedule the IUD insertion for when you will have your menstrual period or right after, to make sure you are not pregnant. Placement of the IUD is better tolerated shortly after a menstrual cycle.  Follow instructions from your health care provider about eating or drinking restrictions.  Ask your health care provider about changing or stopping your regular medicines. This is especially important if you are taking diabetes medicines or blood thinners.  You may get a pain reliever to take before the procedure.  You may have tests for:  Pregnancy. A pregnancy test involves having a urine sample taken.  STIs. Placing an IUD in someone who has an STI can make the infection worse.  Cervical cancer. You may have a Pap test to check for this type of cancer. This means collecting cells from your cervix to be examined under a microscope.  You may have a physical exam to determine the size and position of your uterus. The procedure may vary among health care providers and hospitals. What happens during the procedure?  A tool (speculum) will be placed in your vagina and  widened so that your health care provider can see your cervix.  Medicine may be applied to your cervix to help lower your risk of infection (antiseptic medicine).  You may be given an anesthetic medicine to numb each side of your cervix (intracervical block or paracervical block). This medicine is usually given by an injection into the cervix.  A tool (uterine sound) will be inserted into your uterus to determine the length of your uterus and the direction that your uterus may be tilted.  A slim instrument or tube (IUD inserter) that holds the IUD will be inserted into your vagina, through your cervical canal, and into your uterus.  The IUD will be placed in the uterus, and the IUD inserter will be removed.  The strings that are attached to the IUD will be trimmed so that they lie just below the cervix. The procedure may vary among health care providers and hospitals. What happens after the procedure?  You may have bleeding after the procedure. This is normal. It varies from light bleeding (spotting) for a few  days to menstrual-like bleeding.  You may have cramping and pain.  You may feel dizzy or light-headed.  You may have lower back pain. Summary  An intrauterine device (IUD) is a small, T-shaped device that has one or two nylon strings hanging down from it.  Two types of IUDs are available. You may have a copper IUD or a hormone IUD.  Schedule the IUD insertion for when you will have your menstrual period or right after, to make sure you are not pregnant. Placement of the IUD is better tolerated shortly after a menstrual cycle.  You may have bleeding after the procedure. This is normal. It varies from light spotting for a few days to menstrual-like bleeding. This information is not intended to replace advice given to you by your health care provider. Make sure you discuss any questions you have with your health care provider. Document Released: 08/24/2010 Document Revised:  11/17/2015 Document Reviewed: 11/17/2015 Elsevier Interactive Patient Education  2017 [employer].  Levonorgestrel intrauterine device (IUD) What is this medicine? LEVONORGESTREL IUD (LEE voe nor jes trel) is a contraceptive (birth control) device. The device is placed inside the uterus by a healthcare professional. It is used to prevent pregnancy. This device can also be used to treat heavy bleeding that occurs during your period. This medicine may be used for other purposes; ask your health care provider or pharmacist if you have questions. COMMON BRAND NAME(S): Minette Headland What should I tell my health care provider before I take this medicine? They need to know if you have any of these conditions: -abnormal Pap smear -cancer of the breast, uterus, or cervix -diabetes -endometritis -genital or pelvic infection now or in the past -have more than one sexual partner or your partner has more than one partner -heart disease -history of an ectopic or tubal pregnancy -immune system problems -IUD in place -liver disease or tumor -problems with blood clots or take blood-thinners -seizures -use intravenous drugs -uterus of unusual shape -vaginal bleeding that has not been explained -an unusual or allergic reaction to levonorgestrel, other hormones, silicone, or polyethylene, medicines, foods, dyes, or preservatives -pregnant or trying to get pregnant -breast-feeding How should I use this medicine? This device is placed inside the uterus by a health care professional. Talk to your pediatrician regarding the use of this medicine in children. Special care may be needed. Overdosage: If you think you have taken too much of this medicine contact a poison control center or emergency room at once. NOTE: This medicine is only for you. Do not share this medicine with others. What if I miss a dose? This does not apply. Depending on the brand of device you have inserted, the  device will need to be replaced every 3 to 5 years if you wish to continue using this type of birth control. What may interact with this medicine? Do not take this medicine with any of the following medications: -amprenavir -bosentan -fosamprenavir This medicine may also interact with the following medications: -aprepitant -armodafinil -barbiturate medicines for inducing sleep or treating seizures -bexarotene -boceprevir -griseofulvin -medicines to treat seizures like carbamazepine, ethotoin, felbamate, oxcarbazepine, phenytoin, topiramate -modafinil -pioglitazone -rifabutin -rifampin -rifapentine -some medicines to treat HIV infection like atazanavir, efavirenz, indinavir, lopinavir, nelfinavir, tipranavir, ritonavir -St. John's wort -warfarin This list may not describe all possible interactions. Give your health care provider a list of all the medicines, herbs, non-prescription drugs, or dietary supplements you use. Also tell them if you smoke, drink alcohol, or use  illegal drugs. Some items may interact with your medicine. What should I watch for while using this medicine? Visit your doctor or health care professional for regular check ups. See your doctor if you or your partner has sexual contact with others, becomes HIV positive, or gets a sexual transmitted disease. This product does not protect you against HIV infection (AIDS) or other sexually transmitted diseases. You can check the placement of the IUD yourself by reaching up to the top of your vagina with clean fingers to feel the threads. Do not pull on the threads. It is a good habit to check placement after each menstrual period. Call your doctor right away if you feel more of the IUD than just the threads or if you cannot feel the threads at all. The IUD may come out by itself. You may become pregnant if the device comes out. If you notice that the IUD has come out use a backup birth control method like condoms and call your  health care provider. Using tampons will not change the position of the IUD and are okay to use during your period. This IUD can be safely scanned with magnetic resonance imaging (MRI) only under specific conditions. Before you have an MRI, tell your healthcare provider that you have an IUD in place, and which type of IUD you have in place. What side effects may I notice from receiving this medicine? Side effects that you should report to your doctor or health care professional as soon as possible: -allergic reactions like skin rash, itching or hives, swelling of the face, lips, or tongue -fever, flu-like symptoms -genital sores -high blood pressure -no menstrual period for 6 weeks during use -pain, swelling, warmth in the leg -pelvic pain or tenderness -severe or sudden headache -signs of pregnancy -stomach cramping -sudden shortness of breath -trouble with balance, talking, or walking -unusual vaginal bleeding, discharge -yellowing of the eyes or skin Side effects that usually do not require medical attention (report to your doctor or health care professional if they continue or are bothersome): -acne -breast pain -change in sex drive or performance -changes in weight -cramping, dizziness, or faintness while the device is being inserted -headache -irregular menstrual bleeding within first 3 to 6 months of use -nausea This list may not describe all possible side effects. Call your doctor for medical advice about side effects. You may report side effects to FDA at 1-800-FDA-1088. Where should I keep my medicine? This does not apply. NOTE: This sheet is a summary. It may not cover all possible information. If you have questions about this medicine, talk to your doctor, pharmacist, or health care provider.  2018 Elsevier/Gold Standard (2015-10-08 14:14:56)

## 2018-09-10 NOTE — Addendum Note (Signed)
Addended by: Joylene John D on: 09/10/2018 02:08 PM   Modules accepted: Orders

## 2018-09-11 ENCOUNTER — Telehealth: Payer: Self-pay | Admitting: Oncology

## 2018-09-11 ENCOUNTER — Encounter: Payer: Self-pay | Admitting: Oncology

## 2018-09-11 DIAGNOSIS — E1169 Type 2 diabetes mellitus with other specified complication: Secondary | ICD-10-CM

## 2018-09-11 DIAGNOSIS — Z794 Long term (current) use of insulin: Secondary | ICD-10-CM

## 2018-09-11 NOTE — Telephone Encounter (Signed)
Called Tara Keller and advised her of the HGB A1C results from yesterday of 13.6.  Discussed that an endocrinology referral has been placed and that she should be getting a call to schedule an appointment.  She verbalized understanding and agreement.

## 2018-09-15 ENCOUNTER — Encounter (HOSPITAL_COMMUNITY): Payer: Self-pay

## 2018-09-15 ENCOUNTER — Other Ambulatory Visit: Payer: Self-pay

## 2018-09-15 ENCOUNTER — Ambulatory Visit (HOSPITAL_COMMUNITY)
Admission: EM | Admit: 2018-09-15 | Discharge: 2018-09-15 | Disposition: A | Discharge status: Home / Self Care | Payer: Medicaid Other | Attending: Family Medicine | Admitting: Family Medicine

## 2018-09-15 DIAGNOSIS — Z20828 Contact with and (suspected) exposure to other viral communicable diseases: Secondary | ICD-10-CM | POA: Insufficient documentation

## 2018-09-15 DIAGNOSIS — M545 Low back pain, unspecified: Secondary | ICD-10-CM

## 2018-09-15 DIAGNOSIS — J0110 Acute frontal sinusitis, unspecified: Secondary | ICD-10-CM | POA: Diagnosis not present

## 2018-09-15 DIAGNOSIS — R05 Cough: Secondary | ICD-10-CM | POA: Insufficient documentation

## 2018-09-15 DIAGNOSIS — R059 Cough, unspecified: Secondary | ICD-10-CM

## 2018-09-15 LAB — POCT URINALYSIS DIP (DEVICE)
Bilirubin Urine: NEGATIVE
Glucose, UA: NEGATIVE mg/dL
Ketones, ur: NEGATIVE mg/dL
Leukocytes,Ua: NEGATIVE
Nitrite: NEGATIVE
Protein, ur: NEGATIVE mg/dL
Specific Gravity, Urine: 1.02 (ref 1.005–1.030)
Urobilinogen, UA: 1 mg/dL (ref 0.0–1.0)
pH: 7.5 (ref 5.0–8.0)

## 2018-09-15 MED ORDER — AZITHROMYCIN 250 MG PO TABS: 250.0000 mg | ORAL_TABLET | Freq: Every day | ORAL | 0 refills | Status: DC

## 2018-09-15 NOTE — ED Triage Notes (Signed)
Pt states she has a sinus infection , pressure and drainage. Pt states her lower back is hurting x 4 days.

## 2018-09-15 NOTE — Discharge Instructions (Addendum)
We will inform you of the test from today  Please follow up if your symptoms fail to improve. Please be seen in the emergency department if they become worse.  Please try the medicine  Congratulations on quitting smoking. Keep up the good work.

## 2018-09-15 NOTE — ED Provider Notes (Signed)
[place]    CSN: [id number] Arrival date & time: 09/15/18  1103      History   Chief Complaint Chief Complaint  Patient presents with  . Facial Pain  . Back Pain    HPI Tara Keller is a 53 y.o. female.   She is presenting with frontal sinus pain and pressure, low back pain, and cough.  Symptoms been ongoing for about 4 days.  She has been at home and to the grocery store.  She has not been around anyone with similar symptoms.  Has not done any traveling.  Denies any new or different medications.  Most recently she was found to have a hemoglobin A1c of 13.  Denies any PND.  Has been trying to quit smoking.  She does get sinus infections a couple times a year.  Denies any fevers.  Has been coughing that is been keeping her up.  The low back pain is been ongoing.  Has had intermittent pain.  Can be moderate to severe.  Denies any injury or inciting event.  HPI  Past Medical History:  Diagnosis Date  . Allergy   . Anxiety   . Arthritis   . Asthma   . Cancer ([place])    cervical cancer dx 3 weeks ago  . Depression   . Diabetes mellitus   . Fatty liver   . Fracture closed of upper end of forearm 9 years, Fx left left leg  . Fracture of left lower leg @ 53 years old  . H/O tubal ligation   . Hiatal hernia   . Hyperlipidemia   . Hypertension   . Obesity   . Positive H. pylori test   . Sleep apnea    not using CPAP  . Tubular adenoma of colon     Patient Active Problem List   Diagnosis Date Noted  . Endometrial ca ([place]) 09/10/2018  . Lumbar radiculopathy 04/13/2017  . Back pain 10/18/2016  . Diabetic neuropathy ([place]) 09/15/2016  . Arthritis 09/15/2016  . Hx of adenomatous colonic polyps 08/04/2016  . OSA on CPAP 07/06/2015  . Depression 07/06/2015  . Abnormal barium swallow   . Abdominal pain, epigastric 08/15/2013  . Benign neoplasm of colon 08/15/2013  . Special screening for malignant neoplasms, colon 08/15/2013  . Hepatic steatosis 05/19/2013  .  History of pancreatitis 05/19/2013  . Family history of colon cancer 05/19/2013  . GERD (gastroesophageal reflux disease) 02/18/2013  . Smoking 02/18/2013  . Morbid obesity ([place]) 05/28/2012  . Type 2 diabetes mellitus ([place]) 05/28/2012  . Osteoarthritis of left and right knee 05/28/2012  . Generalized anxiety disorder 05/28/2012    Past Surgical History:  Procedure Laterality Date  . CHOLECYSTECTOMY    . COLONOSCOPY WITH PROPOFOL N/A 08/15/2013   Procedure: COLONOSCOPY WITH PROPOFOL;  Surgeon: Jerene Bears, MD;  Location: [facility];  Service: Gastroenterology;  Laterality: N/A;  . ESOPHAGOGASTRODUODENOSCOPY N/A 05/12/2014   Procedure: ESOPHAGOGASTRODUODENOSCOPY (EGD);  Surgeon: Jerene Bears, MD;  Location: [facility];  Service: Gastroenterology;  Laterality: N/A;  . ESOPHAGOGASTRODUODENOSCOPY (EGD) WITH PROPOFOL N/A 08/15/2013   Procedure: ESOPHAGOGASTRODUODENOSCOPY (EGD) WITH PROPOFOL;  Surgeon: Jerene Bears, MD;  Location: [facility];  Service: Gastroenterology;  Laterality: N/A;  . TUBAL LIGATION Bilateral     OB History    Gravida  3   Para  3   Term  2   Preterm  1   AB      Living  4  SAB      TAB      Ectopic      Multiple  1   Live Births               Home Medications    Prior to Admission medications   Medication Sig Start Date End Date Taking? Authorizing Provider  ACCU-CHEK FASTCLIX LANCETS MISC Use as directed 02/06/18   Charlott Rakes, MD  albuterol (PROVENTIL HFA;VENTOLIN HFA) 108 (90 Base) MCG/ACT inhaler Inhale 1-2 puffs into the lungs every 6 (six) hours as needed for wheezing or shortness of breath. 0/2/72   Delora Fuel, MD  aspirin EC 81 MG tablet Take 1 tablet (81 mg total) by mouth daily. 04/25/16   Maren Reamer, MD  atorvastatin (LIPITOR) 20 MG tablet Take 1 tablet (20 mg total) by mouth daily. 02/06/18   Charlott Rakes, MD  benztropine (COGENTIN) 0.5 MG tablet Take 0.5 mg by mouth at bedtime.    [provider]   Blood Glucose Monitoring Suppl (ACCU-CHEK GUIDE ME) w/Device KIT 48 Units by Does not apply route 2 (two) times daily. 02/06/18   Charlott Rakes, MD  buPROPion (WELLBUTRIN XL) 300 MG 24 hr tablet Take 300 mg by mouth daily. 03/26/18   [provider]  cetirizine (ZYRTEC) 10 MG tablet TAKE ONE TABLET (10 MG) BY MOUTH EVERY DAY Patient taking differently: Take 10 mg by mouth daily.  10/18/17   Charlott Rakes, MD  diclofenac sodium (VOLTAREN) 1 % GEL Apply 2 g topically 4 (four) times daily. 08/20/18   Caccavale, Sophia, PA-C  fluticasone (FLONASE) 50 MCG/ACT nasal spray Place 1-2 sprays into both nostrils daily for 7 days. 01/23/18 07/21/27  Wieters, Hallie C, PA-C  gabapentin (NEURONTIN) 300 MG capsule Take 1 capsule (300 mg total) by mouth 2 (two) times daily. Patient taking differently: Take 300 mg by mouth 2 (two) times daily as needed (pain).  03/08/18   Ladell Pier, MD  glucose blood (ACCU-CHEK GUIDE) test strip Use as instructed 08/29/18   Suella Broad A, PA-C  Insulin Glargine (LANTUS SOLOSTAR) 100 UNIT/ML Solostar Pen Inject 48 Units into the skin 2 (two) times daily. Patient taking differently: Inject 52 Units into the skin 2 (two) times daily.  02/06/18   Charlott Rakes, MD  Insulin Pen Needle 31G X 8 MM MISC Use as directed for 4 times daily insulin administration. 11/07/16   Tresa Garter, MD  Insulin Syringe-Needle U-100 (BD INSULIN SYRINGE ULTRAFINE) 31G X 15/64" 0.5 ML MISC Use as directed 08/11/15   Tresa Garter, MD  ketoconazole (NIZORAL) 2 % cream Apply 1 application topically 2 (two) times daily. 03/27/17   Robyn Haber, MD  levonorgestrel (MIRENA) 20 MCG/24HR IUD 1 Intra Uterine Device (1 each total) by Intrauterine route once for 1 dose. To be placed in the OR 09/10/18 09/10/18  Cross, Lenna Sciara D, NP  liraglutide (VICTOZA) 18 MG/3ML SOPN Inject 0.3 mLs (1.8 mg total) into the skin daily. 02/06/18   Charlott Rakes, MD  lisinopril (PRINIVIL,ZESTRIL) 5 MG  tablet Take 1 tablet (5 mg total) by mouth daily. 02/06/18   Charlott Rakes, MD  megestrol (MEGACE) 40 MG tablet Take 1 tablet (40 mg total) by mouth daily. 07/21/18   Caccavale, Sophia, PA-C  metFORMIN (GLUCOPHAGE-XR) 500 MG 24 hr tablet Take 2 tablets (1,000 mg total) by mouth 2 (two) times daily. 02/06/18   Charlott Rakes, MD  methocarbamol (ROBAXIN) 500 MG tablet Take 2 tablets (1,000 mg total) by mouth 3 (three)  times daily. X 10 days then prn muscle spasm 11/07/17   [name] M, PA-C  naproxen (NAPROSYN) 500 MG tablet Take 1 tablet (500 mg total) by mouth 2 (two) times daily with a meal. 07/21/18   Caccavale, Sophia, PA-C  NEEDLE, DISP, 24 G (B-D DISP NEEDLE 24GX1") 24G X 1" MISC Use with solostar pen to inject 4 times daily 07/09/14   Angelica Chessman E, MD  pantoprazole (PROTONIX) 40 MG tablet Take 1 tablet (40 mg total) by mouth daily. 02/06/18   Charlott Rakes, MD  traZODone (DESYREL) 50 MG tablet Take 25-50 mg by mouth at bedtime as needed for sleep.    [provider]    Family History Family History  Problem Relation Age of Onset  . Diabetes Mother   . Stroke Mother   . Hypertension Mother   . Breast cancer Sister   . Heart attack Sister   . Other Daughter        died at age 76  . Cirrhosis Father   . Colon cancer Maternal Uncle   . Prostate cancer Maternal Uncle        75  . Stomach cancer Maternal Aunt        70  . Colon polyps Neg Hx   . Esophageal cancer Neg Hx   . Rectal cancer Neg Hx     Social History Social History   Tobacco Use  . Smoking status: Current Every Day Smoker    Packs/day: 0.25    Types: Cigarettes    Last attempt to quit: 08/01/2016    Years since quitting: 2.1  . Smokeless tobacco: Never Used  . Tobacco comment: started back smoking 09/02/18  Substance Use Topics  . Alcohol use: No    Alcohol/week: 0.0 standard drinks  . Drug use: No     Allergies   Patient has no known allergies.   Review of Systems Review of  Systems  Constitutional: Negative for fever.  HENT: Positive for congestion.   Respiratory: Positive for cough.   Cardiovascular: Negative for leg swelling.  Gastrointestinal: Negative for abdominal pain.  Musculoskeletal: Positive for back pain.  Skin: Negative for color change.  Neurological: Negative for weakness.  Hematological: Negative for adenopathy.     Physical Exam Triage Vital Signs ED Triage Vitals  Enc Vitals Group     BP 09/15/18 1120 130/89     Pulse Rate 09/15/18 1120 87     Resp 09/15/18 1120 18     Temp 09/15/18 1120 98.5 F (36.9 C)     Temp Source 09/15/18 1120 Oral     SpO2 09/15/18 1120 98 %     Weight 09/15/18 1119 294 lb (133.4 kg)     Height --      Head Circumference --      Peak Flow --      Pain Score 09/15/18 1118 8     Pain Loc --      Pain Edu? --      Excl. in [place]? --    No data found.  Updated Vital Signs BP 130/89 (BP Location: Right Arm)   Pulse 87   Temp 98.5 F (36.9 C) (Oral)   Resp 18   Wt 133.4 kg   LMP 01/23/2014 (Approximate) Comment: neg PREG TEST 07/18/18  SpO2 98%   BMI 47.45 kg/m   Visual Acuity Right Eye Distance:   Left Eye Distance:   Bilateral Distance:    Right Eye Near:   Left  Eye Near:    Bilateral Near:     Physical Exam Gen: NAD, alert, cooperative with exam,  ENT: normal lips, normal nasal mucosa, tympanic membranes clear and intact bilaterally, normal oropharynx, no cervical lymphadenopathy Eye: normal EOM, normal conjunctiva and lids CV:  no edema, +2 pedal pulses, regular rate and rhythm, S1-S2   Resp: no accessory muscle use, non-labored, clear to auscultation bilaterally, no crackles or wheezes  Skin: no rashes, no areas of induration  Neuro: normal tone, normal sensation to touch Psych:  normal insight, alert and oriented MSK: Normal gait, normal strength    UC Treatments / Results  Labs (all labs ordered are listed, but only abnormal results are displayed) Labs Reviewed  POCT URINALYSIS  DIP (DEVICE) - Abnormal; Notable for the following components:      Result Value   Hgb urine dipstick TRACE (*)    All other components within normal limits  NOVEL CORONAVIRUS, NAA (HOSP ORDER, SEND-OUT TO REF LAB; TAT 18-24 HRS)    EKG   Radiology No results found.  Procedures Procedures (including critical care time)  Medications Ordered in UC Medications - No data to display  Initial Impression / Assessment and Plan / UC Course  I have reviewed the triage vital signs and the nursing notes.  Pertinent labs & imaging results that were available during my care of the patient were reviewed by me and considered in my medical decision making (see chart for details).     Abimbola is a 53 year old female is presenting with symptoms suggestive of frontal sinusitis, cough and low back pain.  Likely the back pain is related to the ongoing cough.  Seems to be muscular in nature.  No radicular symptoms.  Cough could be related to viral component.  She is also having frontal sinus symptoms and headache.  Could be viral related as it is ongoing for a few days.  She is also quitting smoking so symptoms could be consistent with some of those changes as well.  She is nontoxic-appearing.  She does have an A1c of 13 so avoiding steroids at this time.  Provided azithromycin.  Counseled on supportive care.  Given indications to follow-up return.  Final Clinical Impressions(s) / UC Diagnoses   Final diagnoses:  Acute non-recurrent frontal sinusitis  Acute bilateral low back pain without sciatica  Cough   Discharge Instructions   None    ED Prescriptions    None     Controlled Substance Prescriptions [place] Controlled Substance Registry consulted? Not Applicable   Rosemarie Ax, MD 09/15/18 1540

## 2018-09-16 LAB — NOVEL CORONAVIRUS, NAA (HOSP ORDER, SEND-OUT TO REF LAB; TAT 18-24 HRS): SARS-CoV-2, NAA: NOT DETECTED

## 2018-09-17 ENCOUNTER — Telehealth: Payer: Self-pay | Admitting: Oncology

## 2018-09-17 NOTE — Telephone Encounter (Addendum)
[facility] and asked if appointment with Dr. Loanne Drilling can be moved up.  They are going to send a message to Dr. Loanne Drilling regarding moving the appointment up.

## 2018-09-17 NOTE — Telephone Encounter (Signed)
Tara Keller and gave her the phone number for [facility] to call and make an appointment.  Also asked if she was able to make an appointment with [facility] for primary care.  She said she has been calling them but the line is busy.  She is going to try again today.  Also advised her that we will check to see when she needs to follow up with [name].  She said she had an appointment on 09/24/18 on her paperwork.  Advised her that the appointment has been canceled and that we will call her back with a new appointment.

## 2018-09-18 ENCOUNTER — Telehealth: Payer: Self-pay | Admitting: Oncology

## 2018-09-18 NOTE — Telephone Encounter (Signed)
Pt called this afternoon stating that she can't get an appointment with the endocrinologist until Dec. 2.  I reassured Ms Casad that Elmo Putt, RN is in contact with that office and is trying to facilitate an earlier appointment. Ms Ottaway verbalized understanding.

## 2018-09-18 NOTE — Telephone Encounter (Signed)
Called Tara Keller and advised her that we were not able to move up her appointment with Endocrinology.  Advised her to try getting an appointment with [facility] or with [facility].

## 2018-09-24 ENCOUNTER — Telehealth: Payer: Self-pay | Admitting: Oncology

## 2018-09-24 NOTE — Telephone Encounter (Signed)
Tara Keller regarding a PCP appointment.  She said [facility] is supposed to call her back tomorrow with an appointment.  Advised her to call back if she does not get an appointment.

## 2018-09-27 ENCOUNTER — Telehealth: Payer: Self-pay

## 2018-09-27 NOTE — Telephone Encounter (Signed)
I spoke with Tara Keller. She states her blood glucose levels have been in the 100's to 200's. She states she is feeling ok.  I told her a D and C with IUD placement has been scheduled for 10/17/2018 at [place] [facility].  I told her that the pre admission testing department will be calling her to set up an appointment.  I told her that because of her comorbid conditions, the anesthesia department may cancel her surgery.  She verbalized understanding.

## 2018-09-30 ENCOUNTER — Telehealth: Payer: Self-pay | Admitting: *Deleted

## 2018-09-30 ENCOUNTER — Ambulatory Visit: Payer: Medicaid Other | Admitting: Gynecologic Oncology

## 2018-09-30 NOTE — Telephone Encounter (Signed)
Records faxed to [place] - att Charlott Rakes  - Release [phone]

## 2018-10-04 ENCOUNTER — Telehealth: Payer: Self-pay | Admitting: Oncology

## 2018-10-04 NOTE — Telephone Encounter (Signed)
Tara Keller regarding PCP appointment.  She said she saw Dr. Darryl Nestle at [facility] and he increased her metformin and enrolled her in the weight loss program.

## 2018-10-08 ENCOUNTER — Encounter (HOSPITAL_COMMUNITY): Payer: Self-pay

## 2018-10-08 NOTE — Patient Instructions (Signed)
DUE TO COVID-19 ONLY ONE VISITOR IS ALLOWED TO COME WITH YOU AND STAY IN THE WAITING ROOM ONLY DURING PRE OP AND PROCEDURE. THE ONE VISITOR MAY VISIT WITH YOU IN YOUR PRIVATE ROOM DURING VISITING HOURS ONLY!!   COVID SWAB TESTING MUST BE COMPLETED ON: Monday, Oct. 5, 2020 at    [address], [place] [facility] enter pre surgical testing line (Must self quarantine after testing. Follow instructions on handout.)             Your procedure is scheduled on: Thursday, Oct. 8, 2020    Report to [facility] Main  Entrance   Report to Short Stay at 5:30 AM   Call this number if you have problems the morning of surgery [phone]   Do not eat food or drink liquids :After Midnight.   Brush your teeth the morning of surgery.   Do NOT smoke after Midnight   Take these medicines the morning of surgery with A SIP OF WATER: Atorvastatin, Bupropion, Cetirizine, Pantoprazole   May use Flonase morning of surgery if needed   Bring Asthma Inhaler day of surgery  DO NOT TAKE ANY DIABETIC MEDICATIONS DAY OF YOUR SURGERY                               You may not have any metal on your body including hair pins, jewelry, and body piercings             Do not wear make-up, lotions, powders, perfumes/cologne, or deodorant             Do not wear nail polish.  Do not shave  48 hours prior to surgery.               Do not bring valuables to the hospital. [place].   Contacts, dentures or bridgework may not be worn into surgery.    Patients discharged the day of surgery will not be allowed to drive home.   Special Instructions: Bring a copy of your healthcare power of attorney and living will documents         the day of surgery if you haven't scanned them in before.              Please read over the following fact sheets you were given:  How to Manage Your Diabetes Before and After Surgery  Why is it important  to control my blood sugar before and after surgery? . Improving blood sugar levels before and after surgery helps healing and can limit problems. . A way of improving blood sugar control is eating a healthy diet by: o  Eating less sugar and carbohydrates o  Increasing activity/exercise o  Talking with your doctor about reaching your blood sugar goals . High blood sugars (greater than 180 mg/dL) can raise your risk of infections and slow your recovery, so you will need to focus on controlling your diabetes during the weeks before surgery. . Make sure that the doctor who takes care of your diabetes knows about your planned surgery including the date and location.  How do I manage my blood sugar before surgery? . Check your blood sugar at least 4 times a day, starting 2 days before surgery, to make sure that the level is not too high or  low. o Check your blood sugar the morning of your surgery when you wake up and every 2 hours until you get to the Short Stay unit. . If your blood sugar is less than 70 mg/dL, you will need to treat for low blood sugar: o Do not take insulin. o Treat a low blood sugar (less than 70 mg/dL) with  cup of clear juice (cranberry or apple), 4 glucose tablets, OR glucose gel. o Recheck blood sugar in 15 minutes after treatment (to make sure it is greater than 70 mg/dL). If your blood sugar is not greater than 70 mg/dL on recheck, call [phone] for further instructions. . Report your blood sugar to the short stay nurse when you get to Short Stay.  . If you are admitted to the hospital after surgery: o Your blood sugar will be checked by the staff and you will probably be given insulin after surgery (instead of oral diabetes medicines) to make sure you have good blood sugar levels. o The goal for blood sugar control after surgery is 80-180 mg/dL.   WHAT DO I DO ABOUT MY DIABETES MEDICATION?  [name] Do not take oral diabetes medicines (pills) the morning of  surgery.  . THE NIGHT BEFORE SURGERY, take 26    units of   Lantus    insulin.       . Oct. 7:  TAKE USUAL MORNING DOSE OF LANTUS, [place]   OCT 7: TAKE USUAL DOSE OF METFORMIN MORNING AND EVENING  . The day of surgery, do not take other diabetes injectables, including Byetta (exenatide), Bydureon (exenatide ER), Victoza (liraglutide), or Trulicity (dulaglutide).  Reviewed and Endorsed by [facility] Patient Education Committee, August 2015  [facility] - Preparing for Surgery Before surgery, you can play an important role.  Because skin is not sterile, your skin needs to be as free of germs as possible.  You can reduce the number of germs on your skin by washing with CHG (chlorahexidine gluconate) soap before surgery.  CHG is an antiseptic cleaner which kills germs and bonds with the skin to continue killing germs even after washing. Please DO NOT use if you have an allergy to CHG or antibacterial soaps.  If your skin becomes reddened/irritated stop using the CHG and inform your nurse when you arrive at Short Stay. Do not shave (including legs and underarms) for at least 48 hours prior to the first CHG shower.  You may shave your face/neck.  Please follow these instructions carefully:  1.  Shower with CHG Soap the night before surgery and the  morning of surgery.  2.  If you choose to wash your hair, wash your hair first as usual with your normal  shampoo.  3.  After you shampoo, rinse your hair and body thoroughly to remove the shampoo.                             4.  Use CHG as you would any other liquid soap.  You can apply chg directly to the skin and wash.  Gently with a scrungie or clean washcloth.  5.  Apply the CHG Soap to your body ONLY FROM THE NECK DOWN.   Do   not use on face/ open                           Wound or open sores. Avoid contact with eyes, ears  mouth and   genitals (private parts).                       Wash face,  Genitals (private parts) with your normal soap.              6.  Wash thoroughly, paying special attention to the area where your    surgery  will be performed.  7.  Thoroughly rinse your body with warm water from the neck down.  8.  DO NOT shower/wash with your normal soap after using and rinsing off the CHG Soap.                9.  Pat yourself dry with a clean towel.            10.  Wear clean pajamas.            11.  Place clean sheets on your bed the night of your first shower and do not  sleep with pets. Day of Surgery : Do not apply any lotions/deodorants the morning of surgery.  Please wear clean clothes to the hospital/surgery center.  FAILURE TO FOLLOW THESE INSTRUCTIONS MAY RESULT IN THE CANCELLATION OF YOUR SURGERY  PATIENT SIGNATURE_________________________________  NURSE SIGNATURE__________________________________  ________________________________________________________________________   Adam Phenix  An incentive spirometer is a tool that can help keep your lungs clear and active. This tool measures how well you are filling your lungs with each breath. Taking long deep breaths may help reverse or decrease the chance of developing breathing (pulmonary) problems (especially infection) following:  A long period of time when you are unable to move or be active. BEFORE THE PROCEDURE   If the spirometer includes an indicator to show your best effort, your nurse or respiratory therapist will set it to a desired goal.  If possible, sit up straight or lean slightly forward. Try not to slouch.  Hold the incentive spirometer in an upright position. INSTRUCTIONS FOR USE  1. Sit on the edge of your bed if possible, or sit up as far as you can in bed or on a chair. 2. Hold the incentive spirometer in an upright position. 3. Breathe out normally. 4. Place the mouthpiece in your mouth and seal your lips tightly around it. 5. Breathe in slowly and as deeply as possible, raising the piston or the ball toward the top of the  column. 6. Hold your breath for 3-5 seconds or for as long as possible. Allow the piston or ball to fall to the bottom of the column. 7. Remove the mouthpiece from your mouth and breathe out normally. 8. Rest for a few seconds and repeat Steps 1 through 7 at least 10 times every 1-2 hours when you are awake. Take your time and take a few normal breaths between deep breaths. 9. The spirometer may include an indicator to show your best effort. Use the indicator as a goal to work toward during each repetition. 10. After each set of 10 deep breaths, practice coughing to be sure your lungs are clear. If you have an incision (the cut made at the time of surgery), support your incision when coughing by placing a pillow or rolled up towels firmly against it. Once you are able to get out of bed, walk around indoors and cough well. You may stop using the incentive spirometer when instructed by your caregiver.  RISKS AND COMPLICATIONS  Take your time so you do not get dizzy or light-headed.  If you are in pain, you may need to take or ask for pain medication before doing incentive spirometry. It is harder to take a deep breath if you are having pain. AFTER USE  Rest and breathe slowly and easily.  It can be helpful to keep track of a log of your progress. Your caregiver can provide you with a simple table to help with this. If you are using the spirometer at home, follow these instructions: [place] IF:   You are having difficultly using the spirometer.  You have trouble using the spirometer as often as instructed.  Your pain medication is not giving enough relief while using the spirometer.  You develop fever of 100.5 F (38.1 C) or higher. SEEK IMMEDIATE MEDICAL CARE IF:   You cough up bloody sputum that had not been present before.  You develop fever of 102 F (38.9 C) or greater.  You develop worsening pain at or near the incision site. MAKE SURE YOU:   Understand these  instructions.  Will watch your condition.  Will get help right away if you are not doing well or get worse. Document Released: 05/08/2006 Document Revised: 03/20/2011 Document Reviewed: 07/09/2006 [facility] Patient Information 2014 [place], [place].   ________________________________________________________________________

## 2018-10-10 ENCOUNTER — Other Ambulatory Visit: Payer: Self-pay

## 2018-10-10 ENCOUNTER — Encounter (HOSPITAL_COMMUNITY): Payer: Self-pay

## 2018-10-10 ENCOUNTER — Encounter (HOSPITAL_COMMUNITY)
Admission: RE | Admit: 2018-10-10 | Discharge: 2018-10-10 | Disposition: A | Discharge status: Home / Self Care | Payer: Medicaid Other | Source: Ambulatory Visit | Attending: Gynecologic Oncology | Admitting: Gynecologic Oncology

## 2018-10-10 DIAGNOSIS — Z01818 Encounter for other preprocedural examination: Secondary | ICD-10-CM | POA: Insufficient documentation

## 2018-10-10 DIAGNOSIS — C541 Malignant neoplasm of endometrium: Secondary | ICD-10-CM | POA: Diagnosis not present

## 2018-10-10 DIAGNOSIS — I517 Cardiomegaly: Secondary | ICD-10-CM | POA: Insufficient documentation

## 2018-10-10 DIAGNOSIS — R9431 Abnormal electrocardiogram [ECG] [EKG]: Secondary | ICD-10-CM | POA: Insufficient documentation

## 2018-10-10 HISTORY — DX: Personal history of colon polyps, unspecified: Z86.0100

## 2018-10-10 HISTORY — DX: Malignant neoplasm of endometrium: C54.1

## 2018-10-10 HISTORY — DX: Child sexual abuse, confirmed, initial encounter: T74.22XA

## 2018-10-10 HISTORY — DX: Postmenopausal bleeding: N95.0

## 2018-10-10 HISTORY — DX: Anesthesia of skin: R20.2

## 2018-10-10 HISTORY — DX: Type 2 diabetes mellitus with diabetic neuropathy, unspecified: E11.40

## 2018-10-10 HISTORY — DX: Cardiomegaly: I51.7

## 2018-10-10 HISTORY — DX: Personal history of urinary calculi: Z87.442

## 2018-10-10 HISTORY — DX: Gastroparesis: K31.84

## 2018-10-10 HISTORY — DX: Personal history of colonic polyps: Z86.010

## 2018-10-10 HISTORY — DX: Alcohol abuse, in remission: F10.11

## 2018-10-10 HISTORY — DX: Morbid (severe) obesity due to excess calories: E66.01

## 2018-10-10 HISTORY — DX: Anesthesia of skin: R20.0

## 2018-10-10 LAB — CBC
HCT: 42.8 % (ref 36.0–46.0)
Hemoglobin: 13.6 g/dL (ref 12.0–15.0)
MCH: 27.6 pg (ref 26.0–34.0)
MCHC: 31.8 g/dL (ref 30.0–36.0)
MCV: 86.8 fL (ref 80.0–100.0)
Platelets: 292 10*3/uL (ref 150–400)
RBC: 4.93 MIL/uL (ref 3.87–5.11)
RDW: 13.2 % (ref 11.5–15.5)
WBC: 11.1 10*3/uL — ABNORMAL HIGH (ref 4.0–10.5)
nRBC: 0 % (ref 0.0–0.2)

## 2018-10-10 LAB — COMPREHENSIVE METABOLIC PANEL
ALT: 22 U/L (ref 0–44)
AST: 16 U/L (ref 15–41)
Albumin: 4 g/dL (ref 3.5–5.0)
Alkaline Phosphatase: 183 U/L — ABNORMAL HIGH (ref 38–126)
Anion gap: 10 (ref 5–15)
BUN: 8 mg/dL (ref 6–20)
CO2: 27 mmol/L (ref 22–32)
Calcium: 9.5 mg/dL (ref 8.9–10.3)
Chloride: 98 mmol/L (ref 98–111)
Creatinine, Ser: 0.61 mg/dL (ref 0.44–1.00)
GFR calc Af Amer: >60 mL/min (ref >60)
GFR calc non Af Amer: >60 mL/min (ref >60)
Glucose, Bld: 173 mg/dL — ABNORMAL HIGH (ref 70–99)
Potassium: 3.6 mmol/L (ref 3.5–5.1)
Sodium: 135 mmol/L (ref 135–145)
Total Bilirubin: 0.6 mg/dL (ref 0.3–1.2)
Total Protein: 8.1 g/dL (ref 6.5–8.1)

## 2018-10-10 LAB — HEMOGLOBIN A1C
Hgb A1c MFr Bld: 12.1 % — ABNORMAL HIGH (ref 4.8–5.6)
Mean Plasma Glucose: 300.57 mg/dL

## 2018-10-10 LAB — URINALYSIS, ROUTINE W REFLEX MICROSCOPIC
Bacteria, UA: NONE SEEN
Bilirubin Urine: NEGATIVE
Glucose, UA: NEGATIVE mg/dL
Ketones, ur: NEGATIVE mg/dL
Leukocytes,Ua: NEGATIVE
Nitrite: NEGATIVE
Protein, ur: NEGATIVE mg/dL
Specific Gravity, Urine: 1.004 — ABNORMAL LOW (ref 1.005–1.030)
pH: 6 (ref 5.0–8.0)

## 2018-10-10 LAB — GLUCOSE, CAPILLARY: Glucose-Capillary: 189 mg/dL — ABNORMAL HIGH (ref 70–99)

## 2018-10-10 NOTE — Progress Notes (Signed)
SPOKE W/  Sansa     SCREENING SYMPTOMS OF COVID 19:   COUGH--NO  RUNNY NOSE--- NO  SORE THROAT---NO  NASAL CONGESTION----NO  SNEEZING----NO  SHORTNESS OF BREATH---NO  DIFFICULTY BREATHING---NO  TEMP >100.0 -----NO  UNEXPLAINED BODY ACHES------NO  CHILLS -------- NO  HEADACHES ---------NO  LOSS OF SMELL/ TASTE --------NO    HAVE YOU OR ANY FAMILY MEMBER TRAVELLED PAST 14 DAYS OUT OF THE   COUNTY---NO STATE---NO COUNTRY----NO  HAVE YOU OR ANY FAMILY MEMBER BEEN EXPOSED TO ANYONE WITH COVID 19? NO
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
## 2018-10-14 ENCOUNTER — Other Ambulatory Visit (HOSPITAL_COMMUNITY)
Admission: RE | Admit: 2018-10-14 | Discharge: 2018-10-14 | Disposition: A | Discharge status: Home / Self Care | Payer: Medicaid Other | Source: Ambulatory Visit | Attending: Gynecologic Oncology | Admitting: Gynecologic Oncology

## 2018-10-14 DIAGNOSIS — Z01818 Encounter for other preprocedural examination: Secondary | ICD-10-CM | POA: Insufficient documentation

## 2018-10-14 DIAGNOSIS — Z20828 Contact with and (suspected) exposure to other viral communicable diseases: Secondary | ICD-10-CM | POA: Diagnosis not present

## 2018-10-15 LAB — NOVEL CORONAVIRUS, NAA (HOSP ORDER, SEND-OUT TO REF LAB; TAT 18-24 HRS): SARS-CoV-2, NAA: NOT DETECTED

## 2018-10-16 NOTE — Anesthesia Preprocedure Evaluation (Addendum)
Anesthesia Evaluation  Patient identified by MRN, date of birth, ID band Patient awake    Reviewed: Allergy & Precautions, NPO status , Patient's Chart, lab work & pertinent test results  Airway Mallampati: I  TM Distance: >3 FB Neck ROM: Full    Dental no notable dental hx. (+) Teeth Intact, Dental Advisory Given,    Pulmonary asthma , sleep apnea , Current Smoker and Patient abstained from smoking.,    Pulmonary exam normal breath sounds clear to auscultation       Cardiovascular hypertension, Pt. on medications Normal cardiovascular exam Rhythm:Regular Rate:Normal     Neuro/Psych PSYCHIATRIC DISORDERS Anxiety Depression negative neurological ROS     GI/Hepatic Neg liver ROS, GERD  Medicated,  Endo/Other  diabetes, Type 2, Oral Hypoglycemic Agents, Insulin DependentMorbid obesity  Renal/GU negative Renal ROS  negative genitourinary   Musculoskeletal  (+) Arthritis ,   Abdominal (+) + obese,   Peds negative pediatric ROS (+)  Hematology negative hematology ROS (+)   Anesthesia Other Findings   Reproductive/Obstetrics negative OB ROS                            Anesthesia Physical  Anesthesia Plan  ASA: III  Anesthesia Plan: General   Post-op Pain Management:    Induction: Intravenous  PONV Risk Score and Plan: 3 and Ondansetron, Dexamethasone, Treatment may vary due to age or medical condition and Midazolam  Airway Management Planned: LMA and Oral ETT  Additional Equipment:   Intra-op Plan:   Post-operative Plan:   Informed Consent: I have reviewed the patients History and Physical, chart, labs and discussed the procedure including the risks, benefits and alternatives for the proposed anesthesia with the patient or authorized representative who has indicated his/her understanding and acceptance.     Dental advisory given  Plan Discussed with: CRNA and  Anesthesiologist  Anesthesia Plan Comments: ( )        Anesthesia Quick Evaluation

## 2018-10-17 ENCOUNTER — Ambulatory Visit (HOSPITAL_COMMUNITY)
Admission: RE | Admit: 2018-10-17 | Discharge: 2018-10-17 | Disposition: A | Discharge status: Home / Self Care | Payer: Medicaid Other | Attending: Gynecologic Oncology | Admitting: Gynecologic Oncology

## 2018-10-17 ENCOUNTER — Ambulatory Visit (HOSPITAL_COMMUNITY): Payer: Medicaid Other | Admitting: Physician Assistant

## 2018-10-17 ENCOUNTER — Ambulatory Visit (HOSPITAL_COMMUNITY): Payer: Medicaid Other | Admitting: Anesthesiology

## 2018-10-17 ENCOUNTER — Encounter (HOSPITAL_COMMUNITY)
Admission: RE | Disposition: A | Discharge status: Home / Self Care | Payer: Self-pay | Source: Home / Self Care | Attending: Gynecologic Oncology

## 2018-10-17 ENCOUNTER — Encounter (HOSPITAL_COMMUNITY): Payer: Self-pay | Admitting: Gynecologic Oncology

## 2018-10-17 DIAGNOSIS — Z8541 Personal history of malignant neoplasm of cervix uteri: Secondary | ICD-10-CM | POA: Insufficient documentation

## 2018-10-17 DIAGNOSIS — E1143 Type 2 diabetes mellitus with diabetic autonomic (poly)neuropathy: Secondary | ICD-10-CM | POA: Diagnosis not present

## 2018-10-17 DIAGNOSIS — Z794 Long term (current) use of insulin: Secondary | ICD-10-CM | POA: Insufficient documentation

## 2018-10-17 DIAGNOSIS — G4730 Sleep apnea, unspecified: Secondary | ICD-10-CM | POA: Diagnosis not present

## 2018-10-17 DIAGNOSIS — K219 Gastro-esophageal reflux disease without esophagitis: Secondary | ICD-10-CM | POA: Diagnosis not present

## 2018-10-17 DIAGNOSIS — C541 Malignant neoplasm of endometrium: Secondary | ICD-10-CM | POA: Diagnosis not present

## 2018-10-17 DIAGNOSIS — K3184 Gastroparesis: Secondary | ICD-10-CM | POA: Diagnosis not present

## 2018-10-17 DIAGNOSIS — E785 Hyperlipidemia, unspecified: Secondary | ICD-10-CM | POA: Insufficient documentation

## 2018-10-17 DIAGNOSIS — Z6841 Body Mass Index (BMI) 40.0 and over, adult: Secondary | ICD-10-CM | POA: Insufficient documentation

## 2018-10-17 DIAGNOSIS — E1165 Type 2 diabetes mellitus with hyperglycemia: Secondary | ICD-10-CM | POA: Diagnosis not present

## 2018-10-17 DIAGNOSIS — F17210 Nicotine dependence, cigarettes, uncomplicated: Secondary | ICD-10-CM | POA: Diagnosis not present

## 2018-10-17 DIAGNOSIS — Z30430 Encounter for insertion of intrauterine contraceptive device: Secondary | ICD-10-CM | POA: Insufficient documentation

## 2018-10-17 DIAGNOSIS — Z79899 Other long term (current) drug therapy: Secondary | ICD-10-CM | POA: Diagnosis not present

## 2018-10-17 DIAGNOSIS — J45909 Unspecified asthma, uncomplicated: Secondary | ICD-10-CM | POA: Insufficient documentation

## 2018-10-17 DIAGNOSIS — E119 Type 2 diabetes mellitus without complications: Secondary | ICD-10-CM

## 2018-10-17 DIAGNOSIS — Z7982 Long term (current) use of aspirin: Secondary | ICD-10-CM | POA: Diagnosis not present

## 2018-10-17 HISTORY — PX: INTRAUTERINE DEVICE (IUD) INSERTION: SHX5877

## 2018-10-17 HISTORY — PX: DILATION AND CURETTAGE OF UTERUS: SHX78

## 2018-10-17 LAB — GLUCOSE, CAPILLARY
Glucose-Capillary: 221 mg/dL — ABNORMAL HIGH (ref 70–99)
Glucose-Capillary: 195 mg/dL — ABNORMAL HIGH (ref 70–99)

## 2018-10-17 SURGERY — DILATION AND CURETTAGE
Anesthesia: General | Site: Vagina

## 2018-10-17 MED ORDER — OXYCODONE HCL 5 MG PO TABS: 5.0000 mg | ORAL_TABLET | Freq: Once | ORAL | Status: DC | PRN

## 2018-10-17 MED ORDER — OXYCODONE HCL 5 MG PO TABS: 5.0000 mg | ORAL_TABLET | ORAL | Status: DC | PRN

## 2018-10-17 MED ORDER — MIDAZOLAM HCL 5 MG/5ML IJ SOLN
INTRAMUSCULAR | Status: DC | PRN
  Administered 2018-10-17: 2 mg via INTRAVENOUS

## 2018-10-17 MED ORDER — FENTANYL CITRATE (PF) 100 MCG/2ML IJ SOLN
INTRAMUSCULAR | Status: AC
  Filled 2018-10-17: qty 2

## 2018-10-17 MED ORDER — LIDOCAINE-EPINEPHRINE (PF) 1 %-1:200000 IJ SOLN
INTRAMUSCULAR | Status: AC
  Filled 2018-10-17: qty 30

## 2018-10-17 MED ORDER — LIDOCAINE 2% (20 MG/ML) 5 ML SYRINGE
INTRAMUSCULAR | Status: DC | PRN
  Administered 2018-10-17: 50 mg via INTRAVENOUS

## 2018-10-17 MED ORDER — ONDANSETRON HCL 4 MG/2ML IJ SOLN: 4.0000 mg | Freq: Once | INTRAMUSCULAR | Status: DC | PRN

## 2018-10-17 MED ORDER — FENTANYL CITRATE (PF) 100 MCG/2ML IJ SOLN
INTRAMUSCULAR | Status: DC | PRN
  Administered 2018-10-17 (×2): 50 ug via INTRAVENOUS

## 2018-10-17 MED ORDER — ACETAMINOPHEN 160 MG/5ML PO SOLN: 325.0000 mg | ORAL | Status: DC | PRN

## 2018-10-17 MED ORDER — OXYCODONE HCL 5 MG/5ML PO SOLN: 5.0000 mg | Freq: Once | ORAL | Status: DC | PRN

## 2018-10-17 MED ORDER — MEPERIDINE HCL 50 MG/ML IJ SOLN: 6.2500 mg | INTRAMUSCULAR | Status: DC | PRN

## 2018-10-17 MED ORDER — PROPOFOL 10 MG/ML IV BOLUS
INTRAVENOUS | Status: AC
  Filled 2018-10-17: qty 40

## 2018-10-17 MED ORDER — ACETAMINOPHEN 325 MG PO TABS: 325.0000 mg | ORAL_TABLET | ORAL | Status: DC | PRN

## 2018-10-17 MED ORDER — ACETAMINOPHEN 500 MG PO TABS: 1000.0000 mg | ORAL_TABLET | Freq: Four times a day (QID) | ORAL | Status: DC

## 2018-10-17 MED ORDER — DEXAMETHASONE SODIUM PHOSPHATE 10 MG/ML IJ SOLN
INTRAMUSCULAR | Status: DC | PRN
  Administered 2018-10-17: 8 mg via INTRAVENOUS

## 2018-10-17 MED ORDER — PROPOFOL 10 MG/ML IV BOLUS
INTRAVENOUS | Status: DC | PRN
  Administered 2018-10-17: 200 mg via INTRAVENOUS

## 2018-10-17 MED ORDER — FENTANYL CITRATE (PF) 100 MCG/2ML IJ SOLN
25.0000 ug | INTRAMUSCULAR | Status: DC | PRN
  Administered 2018-10-17 (×2): 50 ug via INTRAVENOUS

## 2018-10-17 MED ORDER — MIDAZOLAM HCL 2 MG/2ML IJ SOLN
INTRAMUSCULAR | Status: AC
  Filled 2018-10-17: qty 2

## 2018-10-17 MED ORDER — SILVER NITRATE-POT NITRATE 75-25 % EX MISC
CUTANEOUS | Status: AC
  Filled 2018-10-17: qty 1

## 2018-10-17 MED ORDER — LIDOCAINE HCL 1 % IJ SOLN
INTRAMUSCULAR | Status: DC | PRN
  Administered 2018-10-17: 9 mL

## 2018-10-17 MED ORDER — LACTATED RINGERS IV SOLN
INTRAVENOUS | Status: DC
  Administered 2018-10-17: 07:00:00 via INTRAVENOUS

## 2018-10-17 MED ORDER — SUCCINYLCHOLINE CHLORIDE 200 MG/10ML IV SOSY
PREFILLED_SYRINGE | INTRAVENOUS | Status: DC | PRN
  Administered 2018-10-17: 200 mg via INTRAVENOUS

## 2018-10-17 MED ORDER — 0.9 % SODIUM CHLORIDE (POUR BTL) OPTIME
TOPICAL | Status: DC | PRN
  Administered 2018-10-17: 1000 mL

## 2018-10-17 SURGICAL SUPPLY — 25 items
CATH ROBINSON RED A/P 16FR (CATHETERS) ×2 IMPLANT
COVER WAND RF STERILE (DRAPES) IMPLANT
DECANTER SPIKE VIAL GLASS SM (MISCELLANEOUS) ×1 IMPLANT
DRAPE SHEET LG 3/4 BI-LAMINATE (DRAPES) ×2 IMPLANT
DRAPE UNDERBUTTOCKS STRL (DISPOSABLE) ×2 IMPLANT
DRSG TELFA 3X8 NADH (GAUZE/BANDAGES/DRESSINGS) ×2 IMPLANT
GAUZE 4X4 16PLY RFD (DISPOSABLE) ×2 IMPLANT
GLOVE BIO SURGEON STRL SZ 6 (GLOVE) ×4 IMPLANT
GOWN STRL REUS W/ TWL LRG LVL3 (GOWN DISPOSABLE) ×2 IMPLANT
GOWN STRL REUS W/TWL LRG LVL3 (GOWN DISPOSABLE) ×4
KIT BASIN OR (CUSTOM PROCEDURE TRAY) ×2 IMPLANT
KIT TURNOVER KIT A (KITS) IMPLANT
Mirena ×1 IMPLANT
NDL SPNL 22GX3.5 QUINCKE BK (NEEDLE) ×1 IMPLANT
NEEDLE SPNL 22GX3.5 QUINCKE BK (NEEDLE) ×2 IMPLANT
NS IRRIG 1000ML POUR BTL (IV SOLUTION) ×2 IMPLANT
PACK LITHOTOMY IV (CUSTOM PROCEDURE TRAY) ×2 IMPLANT
PAD DRESSING TELFA 3X8 NADH (GAUZE/BANDAGES/DRESSINGS) ×1 IMPLANT
PAD OB MATERNITY 4.3X12.25 (PERSONAL CARE ITEMS) ×2 IMPLANT
SURGILUBE 2OZ TUBE FLIPTOP (MISCELLANEOUS) ×2 IMPLANT
SYR BULB IRRIGATION 50ML (SYRINGE) ×2 IMPLANT
TOWEL OR 17X26 10 PK STRL BLUE (TOWEL DISPOSABLE) ×2 IMPLANT
TOWEL OR NON WOVEN STRL DISP B (DISPOSABLE) ×2 IMPLANT
UNDERPAD 30X36 HEAVY ABSORB (UNDERPADS AND DIAPERS) ×4 IMPLANT
YANKAUER SUCT BULB TIP 10FT TU (MISCELLANEOUS) ×2 IMPLANT
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
## 2018-10-17 NOTE — Anesthesia Postprocedure Evaluation (Signed)
Anesthesia Post Note  Patient: Tara Keller  Procedure(s) Performed: DILATATION AND CURETTAGE (N/A Vagina ) INTRAUTERINE DEVICE (IUD) INSERTION MIRENA (N/A Vagina )     Patient location during evaluation: PACU Anesthesia Type: General Level of consciousness: awake and alert Pain management: pain level controlled Vital Signs Assessment: post-procedure vital signs reviewed and stable Respiratory status: spontaneous breathing, nonlabored ventilation, respiratory function stable and patient connected to nasal cannula oxygen Cardiovascular status: blood pressure returned to baseline and stable Postop Assessment: no apparent nausea or vomiting Anesthetic complications: no    Last Vitals:  Vitals:   10/17/18 0851 10/17/18 0908  BP: 124/80 112/81  Pulse: 87 84  Resp: 18   Temp: 36.5 C 36.7 C  SpO2: 97% 95%    Last Pain:  Vitals:   10/17/18 0908  TempSrc: Oral  PainSc: 0-No pain                 Chai Routh

## 2018-10-17 NOTE — Anesthesia Procedure Notes (Signed)
Procedure Name: Intubation Date/Time: 10/17/2018 7:29 AM Performed by: Anne Fu, CRNA Pre-anesthesia Checklist: Patient identified, Emergency Drugs available, Suction available, Patient being monitored and Timeout performed Patient Re-evaluated:Patient Re-evaluated prior to induction Oxygen Delivery Method: Circle system utilized Preoxygenation: Pre-oxygenation with 100% oxygen Induction Type: IV induction Ventilation: Mask ventilation without difficulty Laryngoscope Size: Mac and 4 Tube type: Oral Tube size: 7.5 mm Number of attempts: 1 Airway Equipment and Method: Stylet Placement Confirmation: ETT inserted through vocal cords under direct vision,  positive ETCO2 and breath sounds checked- equal and bilateral Secured at: 21 cm Tube secured with: Tape Dental Injury: Teeth and Oropharynx as per pre-operative assessment

## 2018-10-17 NOTE — Discharge Instructions (Signed)
D&C, Care After ACTIVITY  Rest as much as possible the first two days after discharge.  You do not have weight restrictions on lifting  Avoid strenuous working out such as running or lifting weights for 48-72 hours  You can climb stairs and drive a car NUTRITION  You may resume your normal diet.  Drink 6 to 8 glasses of fluids a day.  Eat a healthy, balanced diet including portions of food from the meat (protein), milk, fruit, vegetable, and bread groups.  Your caregiver may recommend you take a multivitamin with iron.  ELIMINATION   If constipation occurs, drink more liquids, and add more fruits, vegetables, and bran to your diet. You may take a mild laxative, such as Milk of Magnesia, Metamucil, or a stool softener such as Colace, with permission from your caregiver.  HYGIENE  You may shower and wash your hair.  Avoid tub baths for 4 weeks  Do not add any bath oils or chemicals to your bath water, after you have permission to take baths (2 weeks postop).  Avoid placing anything in the vagina for 2 weeks.   HOME CARE INSTRUCTIONS   Take your temperature twice a day and record it, especially if you feel feverish or have chills.  Follow your caregiver's instructions about medicines, activity, and follow-up appointments after surgery.  Do not drink alcohol while taking pain medicine.  You may take over-the-counter medicine for pain, recommended by your caregiver.  If your pain is not relieved with medicine, call your caregiver.  Do not take aspirin because it can cause bleeding.  Do not douche or use tampons (use a nonperfumed sanitary pad).  Do not have sexual intercourse for 2 weeks postoperatively. Hugging, kissing, and playful sexual activity is fine with your caregiver's permission.  Take showers instead of baths, until your caregiver gives you permission to take baths.  You may take a mild medicine for constipation, recommended by your caregiver. Bran foods  and drinking a lot of fluids will help with constipation.  Make sure your family understands everything about your operation and recovery.  Medications:  - Take ibuprofen and tylenol first line for pain control. Take these regularly (every 6 hours) to decrease the build up of pain.  SEEK MEDICAL CARE IF:    You notice a foul smell coming from the vagina.  You have painful or bloody urination.  You develop nausea and vomiting.  You develop diarrhea.  You develop a rash.  You have a reaction or allergy from the medicine.  You feel dizzy or light-headed.  You need stronger pain medicine.   SEEK IMMEDIATE MEDICAL CARE IF:   You develop a temperature of 102 F (38.9 C) or higher.  You pass out.  You develop leg or chest pain.  You develop abdominal pain.  You develop shortness of breath.  You bleed heavier than a period (soaking through 2 or more pads per hour for 2 hours in a row).  You see pus in the wound area.  MAKE SURE YOU:   Understand these instructions.  Will watch your condition.  Will get help right away if you are not doing well or get worse. Document Released: 08/10/2003 Document Revised: 05/12/2013 Document Reviewed: 11/27/2008 [facility] Patient Information 2015 [place], [place]. This information is not intended to replace advice given to you by your health care provider. Make sure you discuss any questions you have with your health care provider.

## 2018-10-17 NOTE — Op Note (Signed)
OPERATIVE NOTE  PATIENT: [name] ENCOUNTER DATE: 10/15/18   Preop Diagnosis: endometrial cancer, morbid obesity, poorly controlled diabetes mellitus  Postoperative Diagnosis: same  Surgery: D&C (dilation and curettage) and placement of Mirena IUD  Surgeons:  Donaciano Eva, MD Assistant: none  Anesthesia: General   Estimated blood loss: <81ml  IVF:  152ml   Urine output: 123XX123 ml   Complications: None   Pathology: endometrial curetteings  Operative findings: normal appearing cervix, uterus sounded to 10cm, good descensus (able to visualize cervix with regular length weighted speculum).  Procedure: The patient was identified in the preoperative holding area. Informed consent was signed on the chart. Patient was seen history was reviewed and exam was performed.   The patient was then taken to the operating room and placed in the supine position with SCD hose on. General anesthesia was then induced without difficulty. She was then placed in the dorsolithotomy position. The perineum was prepped with Betadine. The vagina was prepped with Betadine. The patient was then draped after the prep was dried. A red rubber cathter was used to empty the bladder was performed under aseptic conditions.  Timeout was performed the patient, procedure, antibiotic, allergy, and length of procedure.   The weighted speculum was placed in the posterior vagina. The single tooth tenaculum was placed on the anterior lip of the cervix. The uterine sound was placed in the cervix and advanced to the fundus. The cervix was successively dilated using pratts dilators to 33. A sharp curette was advanced to the fundus and a comprehensive curette of the endometrial cavity took place until a gritty feel was appreciated. The specimen was collected on a telfa and sent for permanent pathology.  The mirena IUD was deployed to 10cm at the fundus then the strings were cut to 3 inches.  The tenaculum was removed  and hemostasis was observed.   The vagina was irrigated.  All instrument, suture, laparotomy, Ray-Tec, and needle counts were correct x2. The patient tolerated the procedure well and was taken recovery room in stable condition. This is Everitt Amber dictating an operative note on Tara Keller.   Thereasa Solo, MD

## 2018-10-17 NOTE — Transfer of Care (Signed)
Immediate Anesthesia Transfer of Care Note  Patient: Tara Keller  Procedure(s) Performed: Procedure(s): DILATATION AND CURETTAGE (N/A) INTRAUTERINE DEVICE (IUD) INSERTION MIRENA (N/A)  Patient Location: PACU  Anesthesia Type:General  Level of Consciousness:  sedated, patient cooperative and responds to stimulation  Airway & Oxygen Therapy:Patient Spontanous Breathing and Patient connected to face mask oxgen  Post-op Assessment:  Report given to PACU RN and Post -op Vital signs reviewed and stable  Post vital signs:  Reviewed and stable  Last Vitals:  Vitals:   10/17/18 0611  BP: 120/70  Pulse: 87  Resp: 18  SpO2: 0000000    Complications: No apparent anesthesia complications

## 2018-10-18 ENCOUNTER — Telehealth: Payer: Self-pay

## 2018-10-18 ENCOUNTER — Encounter (HOSPITAL_COMMUNITY): Payer: Self-pay | Admitting: Gynecologic Oncology

## 2018-10-18 NOTE — Telephone Encounter (Signed)
Tara Keller is eating, drinking, and urinating well. Throat sore from intubation tube. Suggested gargling with salt water.  This discomfort will improve over the next couple of days. She is experiencing some vaginal spotting which is to be expected. She states that she is having abdominal cramps and that the Midol is not effective. Reviewed with Joylene John, NP. Told her to use ibuprofen 800 mg q 8 hrs alt with 1-2 500 mg of Tylenol q 6 hrs. She can also use a heating pad. Pt verbalized understanding. Pt has not passed gas.  suggested she pick up a stool softener to help with BM the next few days. Pt knows to call the office at [phone] if she has any questions or concerns.

## 2018-10-20 LAB — SURGICAL PATHOLOGY

## 2018-10-22 ENCOUNTER — Telehealth: Payer: Self-pay

## 2018-10-22 NOTE — Telephone Encounter (Signed)
I spoke with Ms Kattner and let her know that the surgical pathology report from her D and C showed grade 1 endometrial cancer.  She verbalized understanding.

## 2018-11-04 ENCOUNTER — Ambulatory Visit (HOSPITAL_COMMUNITY)
Admission: EM | Admit: 2018-11-04 | Discharge: 2018-11-04 | Disposition: A | Discharge status: Home / Self Care | Payer: Medicaid Other

## 2018-11-04 ENCOUNTER — Emergency Department (HOSPITAL_COMMUNITY)
Admission: EM | Admit: 2018-11-04 | Discharge: 2018-11-05 | Disposition: A | Discharge status: Home / Self Care | Payer: Medicaid Other | Attending: Emergency Medicine | Admitting: Emergency Medicine

## 2018-11-04 ENCOUNTER — Other Ambulatory Visit: Payer: Self-pay

## 2018-11-04 ENCOUNTER — Emergency Department (HOSPITAL_COMMUNITY): Payer: Medicaid Other

## 2018-11-04 DIAGNOSIS — K859 Acute pancreatitis without necrosis or infection, unspecified: Secondary | ICD-10-CM | POA: Diagnosis not present

## 2018-11-04 DIAGNOSIS — J45909 Unspecified asthma, uncomplicated: Secondary | ICD-10-CM | POA: Diagnosis not present

## 2018-11-04 DIAGNOSIS — F17210 Nicotine dependence, cigarettes, uncomplicated: Secondary | ICD-10-CM | POA: Diagnosis not present

## 2018-11-04 DIAGNOSIS — Z79899 Other long term (current) drug therapy: Secondary | ICD-10-CM | POA: Diagnosis not present

## 2018-11-04 DIAGNOSIS — Z7982 Long term (current) use of aspirin: Secondary | ICD-10-CM | POA: Insufficient documentation

## 2018-11-04 DIAGNOSIS — I10 Essential (primary) hypertension: Secondary | ICD-10-CM | POA: Insufficient documentation

## 2018-11-04 DIAGNOSIS — R1010 Upper abdominal pain, unspecified: Secondary | ICD-10-CM | POA: Diagnosis present

## 2018-11-04 DIAGNOSIS — Z794 Long term (current) use of insulin: Secondary | ICD-10-CM | POA: Diagnosis not present

## 2018-11-04 DIAGNOSIS — E119 Type 2 diabetes mellitus without complications: Secondary | ICD-10-CM | POA: Insufficient documentation

## 2018-11-04 LAB — I-STAT BETA HCG BLOOD, ED (MC, WL, AP ONLY): I-stat hCG, quantitative: <5 m[IU]/mL (ref <5)

## 2018-11-04 LAB — URINALYSIS, ROUTINE W REFLEX MICROSCOPIC
Bilirubin Urine: NEGATIVE
Glucose, UA: NEGATIVE mg/dL
Ketones, ur: NEGATIVE mg/dL
Leukocytes,Ua: NEGATIVE
Nitrite: NEGATIVE
Protein, ur: NEGATIVE mg/dL
Specific Gravity, Urine: <1.005 — ABNORMAL LOW (ref 1.005–1.030)
pH: 7 (ref 5.0–8.0)

## 2018-11-04 LAB — COMPREHENSIVE METABOLIC PANEL
ALT: 16 U/L (ref 0–44)
AST: 11 U/L — ABNORMAL LOW (ref 15–41)
Albumin: 3.5 g/dL (ref 3.5–5.0)
Alkaline Phosphatase: 202 U/L — ABNORMAL HIGH (ref 38–126)
Anion gap: 11 (ref 5–15)
BUN: <5 mg/dL — ABNORMAL LOW (ref 6–20)
CO2: 28 mmol/L (ref 22–32)
Calcium: 9.6 mg/dL (ref 8.9–10.3)
Chloride: 98 mmol/L (ref 98–111)
Creatinine, Ser: 0.71 mg/dL (ref 0.44–1.00)
GFR calc Af Amer: >60 mL/min (ref >60)
GFR calc non Af Amer: >60 mL/min (ref >60)
Glucose, Bld: 179 mg/dL — ABNORMAL HIGH (ref 70–99)
Potassium: 3.3 mmol/L — ABNORMAL LOW (ref 3.5–5.1)
Sodium: 137 mmol/L (ref 135–145)
Total Bilirubin: 0.6 mg/dL (ref 0.3–1.2)
Total Protein: 7.9 g/dL (ref 6.5–8.1)

## 2018-11-04 LAB — CBC
HCT: 42.2 % (ref 36.0–46.0)
Hemoglobin: 13.3 g/dL (ref 12.0–15.0)
MCH: 27.8 pg (ref 26.0–34.0)
MCHC: 31.5 g/dL (ref 30.0–36.0)
MCV: 88.3 fL (ref 80.0–100.0)
Platelets: 356 10*3/uL (ref 150–400)
RBC: 4.78 MIL/uL (ref 3.87–5.11)
RDW: 13.3 % (ref 11.5–15.5)
WBC: 11.5 10*3/uL — ABNORMAL HIGH (ref 4.0–10.5)
nRBC: 0 % (ref 0.0–0.2)

## 2018-11-04 LAB — URINALYSIS, MICROSCOPIC (REFLEX)

## 2018-11-04 LAB — CBG MONITORING, ED
Glucose-Capillary: 151 mg/dL — ABNORMAL HIGH (ref 70–99)
Glucose-Capillary: 146 mg/dL — ABNORMAL HIGH (ref 70–99)

## 2018-11-04 LAB — LIPASE, BLOOD: Lipase: 95 U/L — ABNORMAL HIGH (ref 11–51)

## 2018-11-04 MED ORDER — OXYCODONE-ACETAMINOPHEN 5-325 MG PO TABS
1.0000 | ORAL_TABLET | ORAL | Status: DC | PRN
  Administered 2018-11-04: 1 via ORAL
  Filled 2018-11-04: qty 1

## 2018-11-04 MED ORDER — IOHEXOL 300 MG/ML  SOLN
100.0000 mL | Freq: Once | INTRAMUSCULAR | Status: AC | PRN
  Administered 2018-11-04: 100 mL via INTRAVENOUS

## 2018-11-04 MED ORDER — SODIUM CHLORIDE 0.9% FLUSH: 3.0000 mL | Freq: Once | INTRAVENOUS | Status: DC

## 2018-11-04 NOTE — ED Triage Notes (Signed)
Patient reports generalized abdominal pain x 2 days, states it is keeping her up at night. Had recent surgery on 10/8 for cervical cancer and states this new pain is different. Denies N/V/D or urinary symptoms. Last BM today. Sent from [facility] for further evaluation. Patient tearful in triage.

## 2018-11-04 NOTE — ED Notes (Signed)
Patient stating she needs something for pain. I informed her that I could not give anything.

## 2018-11-04 NOTE — ED Triage Notes (Signed)
Patient is being discharged from the [facility] and sent to the Emergency Department via wheelchair by staff. Per Dr Mannie Stabile,  patient is stable but in need of higher level of care due to possible complications from recent surgery. Patient is aware and verbalizes understanding of plan of care. There were no vitals filed for this visit.

## 2018-11-05 MED ORDER — SODIUM CHLORIDE 0.9 % IV BOLUS
1000.0000 mL | Freq: Once | INTRAVENOUS | Status: AC
  Administered 2018-11-05: 1000 mL via INTRAVENOUS

## 2018-11-05 MED ORDER — ONDANSETRON 4 MG PO TBDP: 4.0000 mg | ORAL_TABLET | Freq: Three times a day (TID) | ORAL | 0 refills | Status: DC | PRN

## 2018-11-05 MED ORDER — HYDROCODONE-ACETAMINOPHEN 5-325 MG PO TABS: 1.0000 | ORAL_TABLET | Freq: Four times a day (QID) | ORAL | 0 refills | Status: DC | PRN

## 2018-11-05 MED ORDER — ONDANSETRON HCL 4 MG/2ML IJ SOLN
4.0000 mg | Freq: Once | INTRAMUSCULAR | Status: AC
  Administered 2018-11-05: 4 mg via INTRAVENOUS
  Filled 2018-11-05: qty 2

## 2018-11-05 MED ORDER — MORPHINE SULFATE (PF) 4 MG/ML IV SOLN
4.0000 mg | Freq: Once | INTRAVENOUS | Status: AC
  Administered 2018-11-05: 4 mg via INTRAVENOUS
  Filled 2018-11-05: qty 1

## 2018-11-05 MED ORDER — OXYCODONE-ACETAMINOPHEN 5-325 MG PO TABS
1.0000 | ORAL_TABLET | Freq: Once | ORAL | Status: AC
  Administered 2018-11-05: 1 via ORAL
  Filled 2018-11-05: qty 1

## 2018-11-05 NOTE — ED Notes (Signed)
Patient upset and cussing stating "i'm hurting too I need to go back next." informed patient there is one that has waited longer but two that need rooms urgently. Started yelling and asked to see supervisor. Sort Patent examiner notified.

## 2018-11-05 NOTE — ED Notes (Signed)
Pt states pain slightly worse when drinking water

## 2018-11-05 NOTE — Discharge Instructions (Signed)
You were seen today for abdominal pain.  This is likely related to mild pancreatitis.  It is important that you stay hydrated.  Make sure to drink electrolyte rich fluids over the next several days.  I also follow a clear liquid diet.  Take pain and nausea medication as needed.  If you develop worsening pain or nausea and vomiting, you should be reevaluated.  Follow-up with your primary physician by the end of the week for recheck.

## 2018-11-05 NOTE — ED Notes (Signed)
Discharge instructions discussed with pt. Pt verbalized understanding. Pt stable and ambulatory. No signature pad available. 

## 2018-11-05 NOTE — ED Notes (Signed)
Pt tolerating fluids PO well
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
## 2018-11-06 ENCOUNTER — Inpatient Hospital Stay: Payer: Medicaid Other | Admitting: Gynecologic Oncology

## 2018-11-06 ENCOUNTER — Telehealth: Payer: Self-pay | Admitting: Internal Medicine

## 2018-11-06 MED ORDER — HYDROCODONE-ACETAMINOPHEN 5-325 MG PO TABS: 1.0000 | ORAL_TABLET | Freq: Four times a day (QID) | ORAL | 0 refills | Status: DC | PRN

## 2018-11-06 NOTE — Telephone Encounter (Signed)
Patient was in hospital 10/26- told her to follow up with her GI doctor- Dr. Hilarie Fredrickson is booked until 12/16 and she has appt with Alonza Bogus on 11/6. She is having sharp abd. pain and does not feel like she can eat anything- Asking if there is something she can do for the pain until her appt.

## 2018-11-06 NOTE — Telephone Encounter (Signed)
Pt had colon done by Dr. Hilarie Fredrickson in August. States she was seen in the ER recently and diagnosed with acute pancreatitis. She was discharged and prescribed vicodin for pain but only given 10 pills on 10/27. Pt called the office and scheduled a follow-up appt 11/15/18 with Alonza Bogus PA but she is concerned, states she will be out of the vicodin by that time and wants to know what she should do about the pain until the visit. The ER MD prescribed the Vicodin. Please advise.

## 2018-11-06 NOTE — Telephone Encounter (Signed)
For acute pancreatitis I have sent additional RX for 30 tablets which should last until her appt with Vassie Moment to [facility]

## 2018-11-07 NOTE — Telephone Encounter (Signed)
Left message for pt to that the prescription has been sent in for her to the pharmacy.

## 2018-11-15 ENCOUNTER — Inpatient Hospital Stay (HOSPITAL_COMMUNITY)
Admission: AD | Admit: 2018-11-15 | Discharge: 2018-11-17 | DRG: 439 | Disposition: A | Discharge status: Home / Self Care | Payer: Medicaid Other | Source: Ambulatory Visit | Attending: Internal Medicine | Admitting: Internal Medicine

## 2018-11-15 ENCOUNTER — Other Ambulatory Visit: Payer: Self-pay

## 2018-11-15 ENCOUNTER — Ambulatory Visit: Payer: Medicaid Other | Admitting: Gastroenterology

## 2018-11-15 ENCOUNTER — Inpatient Hospital Stay (HOSPITAL_COMMUNITY): Payer: Medicaid Other

## 2018-11-15 ENCOUNTER — Encounter: Payer: Self-pay | Admitting: Gastroenterology

## 2018-11-15 VITALS — BP 118/76 | HR 87 | Temp 97.8°F | Ht 66.0 in | Wt 282.0 lb

## 2018-11-15 DIAGNOSIS — I10 Essential (primary) hypertension: Secondary | ICD-10-CM | POA: Diagnosis present

## 2018-11-15 DIAGNOSIS — F329 Major depressive disorder, single episode, unspecified: Secondary | ICD-10-CM | POA: Diagnosis present

## 2018-11-15 DIAGNOSIS — E785 Hyperlipidemia, unspecified: Secondary | ICD-10-CM | POA: Diagnosis present

## 2018-11-15 DIAGNOSIS — K859 Acute pancreatitis without necrosis or infection, unspecified: Secondary | ICD-10-CM | POA: Diagnosis present

## 2018-11-15 DIAGNOSIS — K853 Drug induced acute pancreatitis without necrosis or infection: Secondary | ICD-10-CM | POA: Diagnosis not present

## 2018-11-15 DIAGNOSIS — E876 Hypokalemia: Secondary | ICD-10-CM | POA: Diagnosis present

## 2018-11-15 DIAGNOSIS — Z823 Family history of stroke: Secondary | ICD-10-CM | POA: Diagnosis not present

## 2018-11-15 DIAGNOSIS — Z8542 Personal history of malignant neoplasm of other parts of uterus: Secondary | ICD-10-CM | POA: Diagnosis not present

## 2018-11-15 DIAGNOSIS — Z794 Long term (current) use of insulin: Secondary | ICD-10-CM | POA: Diagnosis not present

## 2018-11-15 DIAGNOSIS — F17210 Nicotine dependence, cigarettes, uncomplicated: Secondary | ICD-10-CM | POA: Diagnosis present

## 2018-11-15 DIAGNOSIS — E1165 Type 2 diabetes mellitus with hyperglycemia: Secondary | ICD-10-CM | POA: Diagnosis present

## 2018-11-15 DIAGNOSIS — IMO0002 Reserved for concepts with insufficient information to code with codable children: Secondary | ICD-10-CM

## 2018-11-15 DIAGNOSIS — Z8249 Family history of ischemic heart disease and other diseases of the circulatory system: Secondary | ICD-10-CM | POA: Diagnosis not present

## 2018-11-15 DIAGNOSIS — Z833 Family history of diabetes mellitus: Secondary | ICD-10-CM

## 2018-11-15 DIAGNOSIS — Z6841 Body Mass Index (BMI) 40.0 and over, adult: Secondary | ICD-10-CM

## 2018-11-15 DIAGNOSIS — Z79899 Other long term (current) drug therapy: Secondary | ICD-10-CM

## 2018-11-15 DIAGNOSIS — Z803 Family history of malignant neoplasm of breast: Secondary | ICD-10-CM

## 2018-11-15 LAB — CBC WITH DIFFERENTIAL/PLATELET
Abs Immature Granulocytes: 0.05 10*3/uL (ref 0.00–0.07)
Basophils Absolute: 0.1 10*3/uL (ref 0.0–0.1)
Basophils Relative: 0 %
Eosinophils Absolute: 0.2 10*3/uL (ref 0.0–0.5)
Eosinophils Relative: 2 %
HCT: 42.7 % (ref 36.0–46.0)
Hemoglobin: 13.5 g/dL (ref 12.0–15.0)
Immature Granulocytes: 0 %
Lymphocytes Relative: 39 %
Lymphs Abs: 4.4 10*3/uL — ABNORMAL HIGH (ref 0.7–4.0)
MCH: 27.8 pg (ref 26.0–34.0)
MCHC: 31.6 g/dL (ref 30.0–36.0)
MCV: 87.9 fL (ref 80.0–100.0)
Monocytes Absolute: 0.9 10*3/uL (ref 0.1–1.0)
Monocytes Relative: 8 %
Neutro Abs: 5.7 10*3/uL (ref 1.7–7.7)
Neutrophils Relative %: 51 %
Platelets: 299 10*3/uL (ref 150–400)
RBC: 4.86 MIL/uL (ref 3.87–5.11)
RDW: 13.3 % (ref 11.5–15.5)
WBC: 11.2 10*3/uL — ABNORMAL HIGH (ref 4.0–10.5)
nRBC: 0 % (ref 0.0–0.2)

## 2018-11-15 LAB — COMPREHENSIVE METABOLIC PANEL
ALT: 30 U/L (ref 0–44)
AST: 20 U/L (ref 15–41)
Albumin: 3.7 g/dL (ref 3.5–5.0)
Alkaline Phosphatase: 185 U/L — ABNORMAL HIGH (ref 38–126)
Anion gap: 12 (ref 5–15)
BUN: 8 mg/dL (ref 6–20)
CO2: 28 mmol/L (ref 22–32)
Calcium: 9.4 mg/dL (ref 8.9–10.3)
Chloride: 97 mmol/L — ABNORMAL LOW (ref 98–111)
Creatinine, Ser: 0.83 mg/dL (ref 0.44–1.00)
GFR calc Af Amer: >60 mL/min (ref >60)
GFR calc non Af Amer: >60 mL/min (ref >60)
Glucose, Bld: 211 mg/dL — ABNORMAL HIGH (ref 70–99)
Potassium: 3.2 mmol/L — ABNORMAL LOW (ref 3.5–5.1)
Sodium: 137 mmol/L (ref 135–145)
Total Bilirubin: 0.3 mg/dL (ref 0.3–1.2)
Total Protein: 7.8 g/dL (ref 6.5–8.1)

## 2018-11-15 LAB — GLUCOSE, CAPILLARY: Glucose-Capillary: 181 mg/dL — ABNORMAL HIGH (ref 70–99)

## 2018-11-15 LAB — LIPASE, BLOOD: Lipase: 150 U/L — ABNORMAL HIGH (ref 11–51)

## 2018-11-15 MED ORDER — TRAZODONE HCL 50 MG PO TABS
25.0000 mg | ORAL_TABLET | Freq: Every evening | ORAL | Status: DC | PRN
  Administered 2018-11-15 – 2018-11-16 (×2): 50 mg via ORAL
  Filled 2018-11-15 (×2): qty 1

## 2018-11-15 MED ORDER — IOHEXOL 300 MG/ML  SOLN
30.0000 mL | Freq: Once | INTRAMUSCULAR | Status: AC | PRN
  Administered 2018-11-15: 30 mL via ORAL

## 2018-11-15 MED ORDER — IOHEXOL 300 MG/ML  SOLN
100.0000 mL | Freq: Once | INTRAMUSCULAR | Status: AC | PRN
  Administered 2018-11-15: 100 mL via INTRAVENOUS

## 2018-11-15 MED ORDER — INSULIN ASPART 100 UNIT/ML ~~LOC~~ SOLN: 0.0000 [IU] | Freq: Every day | SUBCUTANEOUS | Status: DC

## 2018-11-15 MED ORDER — OXYCODONE HCL 5 MG PO TABS
5.0000 mg | ORAL_TABLET | ORAL | Status: DC | PRN
  Administered 2018-11-15 – 2018-11-17 (×8): 5 mg via ORAL
  Filled 2018-11-15 (×8): qty 1

## 2018-11-15 MED ORDER — ASPIRIN EC 81 MG PO TBEC
81.0000 mg | DELAYED_RELEASE_TABLET | Freq: Every day | ORAL | Status: DC
  Administered 2018-11-15 – 2018-11-17 (×3): 81 mg via ORAL
  Filled 2018-11-15 (×3): qty 1

## 2018-11-15 MED ORDER — SODIUM CHLORIDE 0.9% FLUSH: 3.0000 mL | Freq: Two times a day (BID) | INTRAVENOUS | Status: DC

## 2018-11-15 MED ORDER — POTASSIUM CHLORIDE CRYS ER 20 MEQ PO TBCR
60.0000 meq | EXTENDED_RELEASE_TABLET | Freq: Once | ORAL | Status: AC
  Administered 2018-11-15: 60 meq via ORAL
  Filled 2018-11-15: qty 3

## 2018-11-15 MED ORDER — METHOCARBAMOL 500 MG PO TABS
1000.0000 mg | ORAL_TABLET | Freq: Three times a day (TID) | ORAL | Status: DC | PRN
  Administered 2018-11-16 – 2018-11-17 (×2): 1000 mg via ORAL
  Filled 2018-11-15 (×2): qty 2

## 2018-11-15 MED ORDER — LISINOPRIL 5 MG PO TABS
5.0000 mg | ORAL_TABLET | Freq: Every day | ORAL | Status: DC
  Administered 2018-11-16 – 2018-11-17 (×2): 5 mg via ORAL
  Filled 2018-11-15 (×2): qty 1

## 2018-11-15 MED ORDER — SODIUM CHLORIDE 0.9 % IV SOLN
INTRAVENOUS | Status: DC
  Administered 2018-11-15 – 2018-11-17 (×5): via INTRAVENOUS

## 2018-11-15 MED ORDER — ONDANSETRON 4 MG PO TBDP: 4.0000 mg | ORAL_TABLET | Freq: Three times a day (TID) | ORAL | Status: DC | PRN

## 2018-11-15 MED ORDER — ALBUTEROL SULFATE (2.5 MG/3ML) 0.083% IN NEBU: 2.5000 mg | INHALATION_SOLUTION | Freq: Four times a day (QID) | RESPIRATORY_TRACT | Status: DC | PRN

## 2018-11-15 MED ORDER — INSULIN GLARGINE 100 UNIT/ML ~~LOC~~ SOLN
30.0000 [IU] | Freq: Two times a day (BID) | SUBCUTANEOUS | Status: DC
  Administered 2018-11-15 – 2018-11-17 (×4): 30 [IU] via SUBCUTANEOUS
  Filled 2018-11-15 (×5): qty 0.3

## 2018-11-15 MED ORDER — FLUTICASONE PROPIONATE 50 MCG/ACT NA SUSP
1.0000 | Freq: Every day | NASAL | Status: DC | PRN
  Filled 2018-11-15: qty 16

## 2018-11-15 MED ORDER — BENZTROPINE MESYLATE 0.5 MG PO TABS
0.5000 mg | ORAL_TABLET | Freq: Every day | ORAL | Status: DC
  Administered 2018-11-15 – 2018-11-16 (×2): 0.5 mg via ORAL
  Filled 2018-11-15 (×2): qty 1

## 2018-11-15 MED ORDER — ENOXAPARIN SODIUM 40 MG/0.4ML ~~LOC~~ SOLN
40.0000 mg | SUBCUTANEOUS | Status: DC
  Filled 2018-11-15 (×2): qty 0.4

## 2018-11-15 MED ORDER — ATORVASTATIN CALCIUM 20 MG PO TABS
20.0000 mg | ORAL_TABLET | Freq: Every day | ORAL | Status: DC
  Administered 2018-11-16: 20 mg via ORAL
  Filled 2018-11-15: qty 1

## 2018-11-15 MED ORDER — BUPROPION HCL ER (XL) 300 MG PO TB24
300.0000 mg | ORAL_TABLET | Freq: Every day | ORAL | Status: DC
  Administered 2018-11-16 – 2018-11-17 (×2): 300 mg via ORAL
  Filled 2018-11-15 (×2): qty 1

## 2018-11-15 MED ORDER — DOCUSATE SODIUM 100 MG PO CAPS
100.0000 mg | ORAL_CAPSULE | Freq: Two times a day (BID) | ORAL | Status: DC
  Administered 2018-11-15 – 2018-11-17 (×4): 100 mg via ORAL
  Filled 2018-11-15 (×4): qty 1

## 2018-11-15 MED ORDER — SENNOSIDES-DOCUSATE SODIUM 8.6-50 MG PO TABS: 1.0000 | ORAL_TABLET | Freq: Every evening | ORAL | Status: DC | PRN

## 2018-11-15 MED ORDER — ACETAMINOPHEN 650 MG RE SUPP: 650.0000 mg | Freq: Four times a day (QID) | RECTAL | Status: DC | PRN

## 2018-11-15 MED ORDER — MORPHINE SULFATE (PF) 2 MG/ML IV SOLN
1.0000 mg | INTRAVENOUS | Status: DC | PRN
  Administered 2018-11-15 – 2018-11-17 (×7): 1 mg via INTRAVENOUS
  Filled 2018-11-15 (×7): qty 1

## 2018-11-15 MED ORDER — ONDANSETRON HCL 4 MG/2ML IJ SOLN
4.0000 mg | Freq: Four times a day (QID) | INTRAMUSCULAR | Status: DC | PRN
  Administered 2018-11-16: 4 mg via INTRAVENOUS
  Filled 2018-11-15: qty 2

## 2018-11-15 MED ORDER — INSULIN ASPART 100 UNIT/ML ~~LOC~~ SOLN
0.0000 [IU] | Freq: Three times a day (TID) | SUBCUTANEOUS | Status: DC
  Administered 2018-11-16: 2 [IU] via SUBCUTANEOUS
  Administered 2018-11-16: 5 [IU] via SUBCUTANEOUS
  Administered 2018-11-16 – 2018-11-17 (×3): 3 [IU] via SUBCUTANEOUS

## 2018-11-15 MED ORDER — ONDANSETRON HCL 4 MG PO TABS: 4.0000 mg | ORAL_TABLET | Freq: Four times a day (QID) | ORAL | Status: DC | PRN

## 2018-11-15 MED ORDER — LORATADINE 10 MG PO TABS
10.0000 mg | ORAL_TABLET | Freq: Every day | ORAL | Status: DC
  Administered 2018-11-15 – 2018-11-17 (×3): 10 mg via ORAL
  Filled 2018-11-15 (×3): qty 1

## 2018-11-15 MED ORDER — ACETAMINOPHEN 325 MG PO TABS: 650.0000 mg | ORAL_TABLET | Freq: Four times a day (QID) | ORAL | Status: DC | PRN

## 2018-11-15 MED ORDER — PANTOPRAZOLE SODIUM 40 MG PO TBEC
40.0000 mg | DELAYED_RELEASE_TABLET | Freq: Every day | ORAL | Status: DC
  Administered 2018-11-15 – 2018-11-17 (×3): 40 mg via ORAL
  Filled 2018-11-15 (×3): qty 1

## 2018-11-15 MED ORDER — PHENTERMINE HCL 37.5 MG PO TABS: 37.5000 mg | ORAL_TABLET | Freq: Every day | ORAL | Status: DC

## 2018-11-15 NOTE — Patient Instructions (Addendum)
If you are age 55 or older, your body mass index should be between 23-30. Your Body mass index is 45.52 kg/m. If this is out of the aforementioned range listed, please consider follow up with your Primary Care Provider.  If you are age 76 or younger, your body mass index should be between 19-25. Your Body mass index is 45.52 kg/m. If this is out of the aformentioned range listed, please consider follow up with your Primary Care Provider.   You are being admitted to [facility] directly to [facility].  Thank you for choosing me and [facility].   Alonza Bogus, PA-C
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
## 2018-11-16 DIAGNOSIS — K853 Drug induced acute pancreatitis without necrosis or infection: Secondary | ICD-10-CM

## 2018-11-16 LAB — CBC
HCT: 39.5 % (ref 36.0–46.0)
Hemoglobin: 12.4 g/dL (ref 12.0–15.0)
MCH: 27.7 pg (ref 26.0–34.0)
MCHC: 31.4 g/dL (ref 30.0–36.0)
MCV: 88.2 fL (ref 80.0–100.0)
Platelets: 275 10*3/uL (ref 150–400)
RBC: 4.48 MIL/uL (ref 3.87–5.11)
RDW: 13.4 % (ref 11.5–15.5)
WBC: 10.9 10*3/uL — ABNORMAL HIGH (ref 4.0–10.5)
nRBC: 0 % (ref 0.0–0.2)

## 2018-11-16 LAB — GLUCOSE, CAPILLARY
Glucose-Capillary: 149 mg/dL — ABNORMAL HIGH (ref 70–99)
Glucose-Capillary: 197 mg/dL — ABNORMAL HIGH (ref 70–99)
Glucose-Capillary: 231 mg/dL — ABNORMAL HIGH (ref 70–99)
Glucose-Capillary: 188 mg/dL — ABNORMAL HIGH (ref 70–99)

## 2018-11-16 LAB — BASIC METABOLIC PANEL
Anion gap: 11 (ref 5–15)
BUN: 6 mg/dL (ref 6–20)
CO2: 28 mmol/L (ref 22–32)
Calcium: 9 mg/dL (ref 8.9–10.3)
Chloride: 101 mmol/L (ref 98–111)
Creatinine, Ser: 0.68 mg/dL (ref 0.44–1.00)
GFR calc Af Amer: >60 mL/min (ref >60)
GFR calc non Af Amer: >60 mL/min (ref >60)
Glucose, Bld: 146 mg/dL — ABNORMAL HIGH (ref 70–99)
Potassium: 3.3 mmol/L — ABNORMAL LOW (ref 3.5–5.1)
Sodium: 140 mmol/L (ref 135–145)

## 2018-11-16 LAB — MAGNESIUM: Magnesium: 2.4 mg/dL (ref 1.7–2.4)

## 2018-11-16 LAB — HIV ANTIBODY (ROUTINE TESTING W REFLEX): HIV Screen 4th Generation wRfx: NONREACTIVE

## 2018-11-16 MED ORDER — POTASSIUM CHLORIDE 10 MEQ/100ML IV SOLN
10.0000 meq | INTRAVENOUS | Status: AC
  Administered 2018-11-16 (×3): 10 meq via INTRAVENOUS
  Filled 2018-11-16 (×2): qty 100

## 2018-11-16 NOTE — Progress Notes (Signed)
PROGRESS NOTE    Tara Keller  [id number] DOB: 10-26-1965 DOA: 11/15/2018 PCP: Julian Hy, PA-C   Brief Narrative: 53 y.o. female with medical history significant for hypertension, hyperlipidemia, diabetes on insulin, endometrial cancer with IUD, and depression presenting with ongoing abdominal pain.  Patient was in usual state of health up until about October 23 when she began to develop new onset epigastric abdominal pain.  She reports pain was present with multiple activities but most prominently with dietary intake.  She says the pain would come on as soon as she would try to take in anything and would radiate to her back.  She describes the pain as a very sharp pain.  She reports has never had pain like this before and she has no prior history of pancreatitis.  Since presentation to the emergency room, she has had worsening of her symptoms.  She has been able to maintain adequate p.o. intake.  She does not vomit.  She reports normal bowel habits.  Patient presented to [facility] clinic today and was admitted directly here.  She denies any alcohol abuse.  She states she has not used it for several years.  She has a history of gallstones but has had a cholecystectomy in the past.  She denies any other drug use.  She denies any fevers, chills, chest pain, shortness of breath, cough.  She denies any urinary symptoms.  She is concerned about weight loss reporting she has been trying to lose weight without success for her surgery.  She reports has been under a lot of stress.  She has 2 twin daughters who both have different diagnoses.  She states one was recently diagnosed with MS and the other one was diagnosed with cancer.  She has a fianc at home who is disabled.  She does smoke tobacco and she says recent stress has increased her use about 5 cigarettes a day.   Assessment & Plan:   Active Problems:   Pancreatitis, recurrent   Acute pancreatitis   #1 acute  pancreatitis-unclear etiology.  Her lipase on admission was 150 lipase from today is pending.   CT mild fat stranding around pancreatic head no evidence of pancreatic necrosis no pseudocyst hepatic steatosis status post cholecystectomy. Patient tolerated clear liquids, will advance to full liquid diet today. Check fasting lipid profile tomorrow. Question if this is related to Victoza continue to hold. Continue IV fluids and pain control ?  Victoza?  Lipitor causing  pancreatitis  #2 type 2 diabetes poorly controlled hemoglobin A1c is 12.1.  Lantus 30 units twice a day Blood sugars between 1 49-2 21.  Continue current dose of Lantus.    #3 hypertension on lisinopril 5 mg daily left pressure 116/69  #4 hyperlipidemia on Lipitor  #5 endometrial cancer followed by [name] plan for surgical intervention after weight loss patient on phentermine.  #6 depression continue Wellbutrin and Cogentin  #7 morbid obesity BMI 45.52 on phentermine  #8 hypokalemia replete and recheck tomorrow  #9 isolated elevation in alkaline phosphatase follow-up levels tomorrow  #10 tobacco abuse counseled patient.    Estimated body mass index is 45.52 kg/m as calculated from the following:   Height as of an earlier encounter on 11/15/18: 5\' 6"  (1.676 m).   Weight as of an earlier encounter on 11/15/18: 127.9 kg.  DVT prophylaxis: Lovenox  code Status: Full code Family Communication: None  disposition Plan: Pending clinical improvement  Consultants:   GI  Procedures: None Antimicrobials: None Subjective: She  feels her pain is better compared to yesterday tolerating water and clear liquids denies nausea vomiting or diarrhea  Objective: Vitals:   11/15/18 1738 11/15/18 2150 11/16/18 0656  BP: 139/82 111/61 116/69  Pulse: 70 84 65  Resp:  18 18  Temp: 97.7 F (36.5 C) 98.6 F (37 C) 97.9 F (36.6 C)  TempSrc: Oral Oral Oral  SpO2: 98% 100% 96%    Intake/Output Summary (Last 24 hours) at  11/16/2018 0919 Last data filed at 11/16/2018 [id number] Gross per 24 hour  Intake 1055.98 ml  Output 3350 ml  Net -2294.02 ml   There were no vitals filed for this visit.  Examination:  General exam: Appears calm and comfortable  Respiratory system: Clear to auscultation. Respiratory effort normal. Cardiovascular system: S1 & S2 heard, RRR. No JVD, murmurs, rubs, gallops or clicks. No pedal edema. Gastrointestinal system: Abdomen is nondistended, soft and nontender. No organomegaly or masses felt. Normal bowel sounds heard. Central nervous system: Alert and oriented. No focal neurological deficits. Extremities: Symmetric 5 x 5 power. Skin: No rashes, lesions or ulcers Psychiatry: Judgement and insight appear normal. Mood & affect appropriate.     Data Reviewed: I have personally reviewed following labs and imaging studies  CBC: Recent Labs  Lab 11/15/18 1906 11/16/18 0343  WBC 11.2* 10.9*  NEUTROABS 5.7  --   HGB 13.5 12.4  HCT 42.7 39.5  MCV 87.9 88.2  PLT 299 [id number]   Basic Metabolic Panel: Recent Labs  Lab 11/15/18 1906 11/16/18 0343  NA 137 140  K 3.2* 3.3*  CL 97* 101  CO2 28 28  GLUCOSE 211* 146*  BUN 8 6  CREATININE 0.83 0.68  CALCIUM 9.4 9.0   GFR: Estimated Creatinine Clearance: 108.8 mL/min (by C-G formula based on SCr of 0.68 mg/dL). Liver Function Tests: Recent Labs  Lab 11/15/18 1906  AST 20  ALT 30  ALKPHOS 185*  BILITOT 0.3  PROT 7.8  ALBUMIN 3.7   Recent Labs  Lab 11/15/18 1906  LIPASE 150*   No results for input(s): AMMONIA in the last 168 hours. Coagulation Profile: No results for input(s): INR, PROTIME in the last 168 hours. Cardiac Enzymes: No results for input(s): CKTOTAL, CKMB, CKMBINDEX, TROPONINI in the last 168 hours. BNP (last 3 results) No results for input(s): PROBNP in the last 8760 hours. HbA1C: No results for input(s): HGBA1C in the last 72 hours. CBG: Recent Labs  Lab 11/15/18 2202 11/16/18 0744  GLUCAP 181* 149*     Lipid Profile: No results for input(s): CHOL, HDL, LDLCALC, TRIG, CHOLHDL, LDLDIRECT in the last 72 hours. Thyroid Function Tests: No results for input(s): TSH, T4TOTAL, FREET4, T3FREE, THYROIDAB in the last 72 hours. Anemia Panel: No results for input(s): VITAMINB12, FOLATE, FERRITIN, TIBC, IRON, RETICCTPCT in the last 72 hours. Sepsis Labs: No results for input(s): PROCALCITON, LATICACIDVEN in the last 168 hours.  No results found for this or any previous visit (from the past 240 hour(s)).       Radiology Studies: Ct Abdomen Pelvis W Contrast  Result Date: 11/15/2018 CLINICAL DATA:  Pancreatitis EXAM: CT ABDOMEN AND PELVIS WITH CONTRAST TECHNIQUE: Multidetector CT imaging of the abdomen and pelvis was performed using the standard protocol following bolus administration of intravenous contrast. CONTRAST:  150mL OMNIPAQUE IOHEXOL 300 MG/ML SOLN, 26mL OMNIPAQUE IOHEXOL 300 MG/ML SOLN COMPARISON:  11/04/2018 FINDINGS: Lower chest: The lung bases are clear. The heart size is normal. Hepatobiliary: There is decreased hepatic attenuation suggestive of hepatic steatosis. Status post cholecystectomy.There  is no biliary ductal dilation. Pancreas: There is some mild fat stranding about the pancreatic head, improved from prior study. No pancreatic pseudocyst. No drainable collection. Spleen: No splenic laceration or hematoma. Adrenals/Urinary Tract: --Adrenal glands: No adrenal hemorrhage. --Right kidney/ureter: No hydronephrosis or perinephric hematoma. --Left kidney/ureter: No hydronephrosis or perinephric hematoma. --Urinary bladder: Unremarkable. Stomach/Bowel: --Stomach/Duodenum: No hiatal hernia or other gastric abnormality. Normal duodenal course and caliber. --Small bowel: No dilatation or inflammation. --Colon: No focal abnormality. --Appendix: Normal. Vascular/Lymphatic: Normal course and caliber of the major abdominal vessels. --No retroperitoneal lymphadenopathy. --No mesenteric  lymphadenopathy. --No pelvic or inguinal lymphadenopathy. Reproductive: There is an IUD in place. Other: No ascites or free air. The abdominal wall is normal. Musculoskeletal. No acute displaced fractures. IMPRESSION: 1. Mild fat stranding about the pancreatic head, improved from prior study. No evidence for pancreatic necrosis. No pseudocyst. 2. Hepatic steatosis. 3. Status post cholecystectomy. 4. Normal appendix in the right lower quadrant. Electronically Signed   By: Constance Holster M.D.   On: 11/15/2018 21:46        Scheduled Meds:  aspirin EC  81 mg Oral Daily   atorvastatin  20 mg Oral q1800   benztropine  0.5 mg Oral QHS   buPROPion  300 mg Oral Daily   docusate sodium  100 mg Oral BID   enoxaparin (LOVENOX) injection  40 mg Subcutaneous Q24H   insulin aspart  0-15 Units Subcutaneous TID WC   insulin aspart  0-5 Units Subcutaneous QHS   insulin glargine  30 Units Subcutaneous BID   lisinopril  5 mg Oral Daily   loratadine  10 mg Oral Daily   pantoprazole  40 mg Oral Daily   phentermine  37.5 mg Oral Daily   sodium chloride flush  3 mL Intravenous Q12H   Continuous Infusions:  sodium chloride 150 mL/hr at 11/16/18 0246     LOS: 1 day     Georgette Shell, MD [facility]  If 7PM-7AM, please contact night-coverage [email or web address] Password [facility] 11/16/2018, 9:19 AM

## 2018-11-17 ENCOUNTER — Encounter (HOSPITAL_COMMUNITY): Payer: Self-pay

## 2018-11-17 LAB — COMPREHENSIVE METABOLIC PANEL
ALT: 31 U/L (ref 0–44)
AST: 21 U/L (ref 15–41)
Albumin: 3.1 g/dL — ABNORMAL LOW (ref 3.5–5.0)
Alkaline Phosphatase: 161 U/L — ABNORMAL HIGH (ref 38–126)
Anion gap: 9 (ref 5–15)
BUN: <5 mg/dL — ABNORMAL LOW (ref 6–20)
CO2: 26 mmol/L (ref 22–32)
Calcium: 8.9 mg/dL (ref 8.9–10.3)
Chloride: 105 mmol/L (ref 98–111)
Creatinine, Ser: 0.56 mg/dL (ref 0.44–1.00)
GFR calc Af Amer: >60 mL/min (ref >60)
GFR calc non Af Amer: >60 mL/min (ref >60)
Glucose, Bld: 168 mg/dL — ABNORMAL HIGH (ref 70–99)
Potassium: 3.6 mmol/L (ref 3.5–5.1)
Sodium: 140 mmol/L (ref 135–145)
Total Bilirubin: 0.5 mg/dL (ref 0.3–1.2)
Total Protein: 6.8 g/dL (ref 6.5–8.1)

## 2018-11-17 LAB — LIPID PANEL
Cholesterol: 146 mg/dL (ref 0–200)
HDL: 30 mg/dL — ABNORMAL LOW (ref >40)
LDL Cholesterol: 94 mg/dL (ref 0–99)
Total CHOL/HDL Ratio: 4.9 RATIO
Triglycerides: 111 mg/dL (ref <150)
VLDL: 22 mg/dL (ref 0–40)

## 2018-11-17 LAB — LIPASE, BLOOD: Lipase: 84 U/L — ABNORMAL HIGH (ref 11–51)

## 2018-11-17 LAB — GLUCOSE, CAPILLARY
Glucose-Capillary: 166 mg/dL — ABNORMAL HIGH (ref 70–99)
Glucose-Capillary: 179 mg/dL — ABNORMAL HIGH (ref 70–99)

## 2018-11-17 NOTE — Progress Notes (Signed)
Dr Rodena Piety aware pt tolerated lunch and ready for dc.

## 2018-11-17 NOTE — Plan of Care (Signed)
Assessment unchanged. Pt verbalized understanding of dc instructions through teach back. No script at dc. Verbalized understanding to STOP taking Victoza per Dr. Rodena Piety. Discharged via wc to front entrance accompanied by NT.

## 2018-11-17 NOTE — Progress Notes (Signed)
PROGRESS NOTE    Tara Keller  [id number] DOB: [birth year] DOA: 11/15/2018 PCP: Julian Hy, PA-C   Brief Narrative:53 y.o.femalewith medical history significantfor hypertension, hyperlipidemia, diabetes on insulin, endometrial cancer with IUD, and depression presenting with ongoing abdominal pain. Patient was in usual state of health up until about October 23 when she began to develop new onset epigastric abdominal pain. She reports pain was present with multiple activities but most prominently with dietary intake. She says the pain would come on as soon as she would try to take in anything and would radiate to her back. She describes the pain as a very sharp pain. She reports has never had pain like this before and she has no prior history of pancreatitis. Since presentation to the emergency room, she has had worsening of her symptoms.  She has been able to maintain adequate p.o. intake. She does not vomit. She reports normal bowel habits.  Patient presented [place] clinic today and was admitted directly here.  She denies any alcohol abuse. She states she has not used it for several years. She has a history of gallstones but has had a cholecystectomy in the past. She denies any other drug use.  She denies any fevers,chills,chest pain,shortness of breath,cough.She denies any urinary symptoms.She is concerned about weight loss reporting she has been trying to lose weight without success for her surgery.  She reports has been under a lot of stress. She has 2 twin daughters who both have different diagnoses. She states one was recently diagnosed with MS and the other one was diagnosed with cancer. She has a fianc at home who is disabled.  She does smoke tobacco and she says recent stress has increased her use about 5 cigarettes a day.  Assessment & Plan:   Active Problems:   Pancreatitis, recurrent   Acute pancreatitis  #1 acute  pancreatitis-unclear etiology.  Her lipase on admission was 150 lipase came down to 84.   CT mild fat stranding around pancreatic head no evidence of pancreatic necrosis no pseudocyst hepatic steatosis status post cholecystectomy. Patient tolerated clear liquids, will advance to full liquid diet today. Fasting lipid profile unremarkable. Victoza was on hold since admission.  Her symptoms improved with n.p.o. IV fluids and pain control.  She tolerated air regular diet on the day of discharge.  I will DC Victoza upon discharge.  #2 type 2 diabetes poorly controlled hemoglobin A1c is 12.1. continue Lantus   #3 hypertension on lisinopril 5 mg daily left pressure 116/69  #4 hyperlipidemia on Lipitor  #5 endometrial cancer followed by [name] plan for surgical intervention after weight loss patient on phentermine.  #6 depression continue Wellbutrin and Cogentin  #7 morbid obesity BMI 45.52 on phentermine  #8 hypokalemia repleted potassium 3.6.    #9 isolated elevation in alkaline phosphatase follow-up levels tomorrow  #10 tobacco abuse counseled patient.  Estimated body mass index is 45.52 kg/m as calculated from the following:   Height as of an earlier encounter on 11/15/18: 5\' 6"  (1.676 m).   Weight as of an earlier encounter on 11/15/18: 127.9 kg.  DVT prophylaxis: Lovenox  code Status: Full code Family Communication: None  disposition Plan: Pending clinical improvement  Consultants:   GI  Procedures: None Antimicrobials: None  Subjective: Tolerated diet no nausea vomiting   Objective: Vitals:   11/16/18 0656 11/16/18 1254 11/16/18 2042 11/17/18 0621  BP: 116/69 135/82 132/82 109/82  Pulse: 65 66 71 74  Resp: 18 18 18 18   Temp:  97.9 F (36.6 C) (!) 97.5 F (36.4 C) 98 F (36.7 C) 97.7 F (36.5 C)  TempSrc: Oral Oral Oral Oral  SpO2: 96% 97% 100% 97%    Intake/Output Summary (Last 24 hours) at 11/17/2018 1139 Last data filed at 11/17/2018 0956 Gross  per 24 hour  Intake 2058.13 ml  Output 1750 ml  Net 308.13 ml   There were no vitals filed for this visit.  Examination:  General exam: Appears calm and comfortable  Respiratory system: Clear to auscultation. Respiratory effort normal. Cardiovascular system: S1 & S2 heard, RRR. No JVD, murmurs, rubs, gallops or clicks. No pedal edema. Gastrointestinal system: Abdomen is nondistended, soft and nontender. No organomegaly or masses felt. Normal bowel sounds heard. Central nervous system: Alert and oriented. No focal neurological deficits. Extremities: Symmetric 5 x 5 power. Skin: No rashes, lesions or ulcers Psychiatry: Judgement and insight appear normal. Mood & affect appropriate.     Data Reviewed: I have personally reviewed following labs and imaging studies  CBC: Recent Labs  Lab 11/15/18 1906 11/16/18 0343  WBC 11.2* 10.9*  NEUTROABS 5.7  --   HGB 13.5 12.4  HCT 42.7 39.5  MCV 87.9 88.2  PLT 299 [id number]   Basic Metabolic Panel: Recent Labs  Lab 11/15/18 1906 11/16/18 0343 11/16/18 1335 11/17/18 0326  NA 137 140  --  140  K 3.2* 3.3*  --  3.6  CL 97* 101  --  105  CO2 28 28  --  26  GLUCOSE 211* 146*  --  168*  BUN 8 6  --  <5*  CREATININE 0.83 0.68  --  0.56  CALCIUM 9.4 9.0  --  8.9  MG  --   --  2.4  --    GFR: Estimated Creatinine Clearance: 108.8 mL/min (by C-G formula based on SCr of 0.56 mg/dL). Liver Function Tests: Recent Labs  Lab 11/15/18 1906 11/17/18 0326  AST 20 21  ALT 30 31  ALKPHOS 185* 161*  BILITOT 0.3 0.5  PROT 7.8 6.8  ALBUMIN 3.7 3.1*   Recent Labs  Lab 11/15/18 1906 11/17/18 0326  LIPASE 150* 84*   No results for input(s): AMMONIA in the last 168 hours. Coagulation Profile: No results for input(s): INR, PROTIME in the last 168 hours. Cardiac Enzymes: No results for input(s): CKTOTAL, CKMB, CKMBINDEX, TROPONINI in the last 168 hours. BNP (last 3 results) No results for input(s): PROBNP in the last 8760 hours. HbA1C: No  results for input(s): HGBA1C in the last 72 hours. CBG: Recent Labs  Lab 11/16/18 0744 11/16/18 1133 11/16/18 1632 11/16/18 2257 11/17/18 0734  GLUCAP 149* 197* 231* 188* 166*   Lipid Profile: Recent Labs    11/17/18 0326  CHOL 146  HDL 30*  LDLCALC 94  TRIG 111  CHOLHDL 4.9   Thyroid Function Tests: No results for input(s): TSH, T4TOTAL, FREET4, T3FREE, THYROIDAB in the last 72 hours. Anemia Panel: No results for input(s): VITAMINB12, FOLATE, FERRITIN, TIBC, IRON, RETICCTPCT in the last 72 hours. Sepsis Labs: No results for input(s): PROCALCITON, LATICACIDVEN in the last 168 hours.  No results found for this or any previous visit (from the past 240 hour(s)).       Radiology Studies: Ct Abdomen Pelvis W Contrast  Result Date: 11/15/2018 CLINICAL DATA:  Pancreatitis EXAM: CT ABDOMEN AND PELVIS WITH CONTRAST TECHNIQUE: Multidetector CT imaging of the abdomen and pelvis was performed using the standard protocol following bolus administration of intravenous contrast. CONTRAST:  154mL OMNIPAQUE IOHEXOL 300  MG/ML SOLN, 66mL OMNIPAQUE IOHEXOL 300 MG/ML SOLN COMPARISON:  11/04/2018 FINDINGS: Lower chest: The lung bases are clear. The heart size is normal. Hepatobiliary: There is decreased hepatic attenuation suggestive of hepatic steatosis. Status post cholecystectomy.There is no biliary ductal dilation. Pancreas: There is some mild fat stranding about the pancreatic head, improved from prior study. No pancreatic pseudocyst. No drainable collection. Spleen: No splenic laceration or hematoma. Adrenals/Urinary Tract: --Adrenal glands: No adrenal hemorrhage. --Right kidney/ureter: No hydronephrosis or perinephric hematoma. --Left kidney/ureter: No hydronephrosis or perinephric hematoma. --Urinary bladder: Unremarkable. Stomach/Bowel: --Stomach/Duodenum: No hiatal hernia or other gastric abnormality. Normal duodenal course and caliber. --Small bowel: No dilatation or inflammation. --Colon:  No focal abnormality. --Appendix: Normal. Vascular/Lymphatic: Normal course and caliber of the major abdominal vessels. --No retroperitoneal lymphadenopathy. --No mesenteric lymphadenopathy. --No pelvic or inguinal lymphadenopathy. Reproductive: There is an IUD in place. Other: No ascites or free air. The abdominal wall is normal. Musculoskeletal. No acute displaced fractures. IMPRESSION: 1. Mild fat stranding about the pancreatic head, improved from prior study. No evidence for pancreatic necrosis. No pseudocyst. 2. Hepatic steatosis. 3. Status post cholecystectomy. 4. Normal appendix in the right lower quadrant. Electronically Signed   By: Constance Holster M.D.   On: 11/15/2018 21:46        Scheduled Meds:  aspirin EC  81 mg Oral Daily   atorvastatin  20 mg Oral q1800   benztropine  0.5 mg Oral QHS   buPROPion  300 mg Oral Daily   docusate sodium  100 mg Oral BID   enoxaparin (LOVENOX) injection  40 mg Subcutaneous Q24H   insulin aspart  0-15 Units Subcutaneous TID WC   insulin aspart  0-5 Units Subcutaneous QHS   insulin glargine  30 Units Subcutaneous BID   lisinopril  5 mg Oral Daily   loratadine  10 mg Oral Daily   pantoprazole  40 mg Oral Daily   sodium chloride flush  3 mL Intravenous Q12H   Continuous Infusions:  sodium chloride 150 mL/hr at 11/17/18 0724     LOS: 2 days     Georgette Shell, MD [facility]  If 7PM-7AM, please contact night-coverage [email or web address] Password [facility] 11/17/2018, 11:39 AM

## 2018-11-18 NOTE — Discharge Summary (Signed)
Physician Discharge Summary  Tara Keller TDH:[id number] DOB: October 27, 1965 DOA: 11/15/2018  PCP: Julian Hy, PA-C  Admit date: 11/15/2018 Discharge date: 11/18/2018 ADMITTED FROM HOME Disposition: HOME  Recommendations for Outpatient Follow-up:  1. Follow up with PCP in 1-2 weeks 2. Please obtain CMP/CBC in one week   Home Health: None Equipment/Devices: None Discharge Condition stable and improved CODE STATUS: Full code  diet recommendation: Cardiac diet Brief/Interim Summary:53 y.o.femalewith medical history significantfor hypertension, hyperlipidemia, diabetes on insulin, endometrial cancer with IUD, and depression presenting with ongoing abdominal pain. Patient was in usual state of health up until about October 23 when she began to develop new onset epigastric abdominal pain. She reports pain was present with multiple activities but most prominently with dietary intake. She says the pain would come on as soon as she would try to take in anything and would radiate to her back. She describes the pain as a very sharp pain. She reports has never had pain like this before and she has no prior history of pancreatitis. Since presentation to the emergency room, she has had worsening of her symptoms.  She has been able to maintain adequate p.o. intake. She does not vomit. She reports normal bowel habits.  Patient presented [facility] today and was admitted directly here.  She denies any alcohol abuse. She states she has not used it for several years. She has a history of gallstones but has had a cholecystectomy in the past. She denies any other drug use.  She denies any fevers,chills,chest pain,shortness of breath,cough.She denies any urinary symptoms.She is concerned about weight loss reporting she has been trying to lose weight without success for her surgery.  She reports has been under a lot of stress. She has 2 twin daughters who both have  different diagnoses. She states one was recently diagnosed with MS and the other one was diagnosed with cancer. She has a fianc at home who is disabled.  She does smoke tobacco and she says recent stress has increased her use about 5 cigarettes a day.    Discharge Diagnoses:  Active Problems:   Pancreatitis, recurrent   Acute pancreatitis  #1 acute pancreatitis-unclear etiology. Her lipase on admission was 150 lipase came down to 84.  CTmild fat stranding around pancreatic head no evidence of pancreatic necrosis no pseudocyst hepatic steatosis status post cholecystectomy. Patient tolerated clear liquids, will advance to full liquid diet today. Fasting lipid profile unremarkable. Victoza was on hold since admission.  Her symptoms improved with n.p.o. IV fluids and pain control.  She tolerated air regular diet on the day of discharge.  I will DC Victoza upon discharge.  #2 type 2 diabetes poorly controlled hemoglobin A1c is 12.1. continue Lantus  #3 hypertension on lisinopril 5 mg daily left pressure 116/69  #4 hyperlipidemia on Lipitor  #5 endometrial cancer followed by [name] plan for surgical intervention after weight loss patient on phentermine.  #6 depression continue Wellbutrin and Cogentin  #7 morbid obesity BMI 45.52 on phentermine  #8 hypokalemia repleted potassium 3.6.    #9 isolated elevation in alkaline phosphatase follow-up levels tomorrow  #10 tobacco abuse counseled patient.  Estimated body mass index is 45.52 kg/m as calculated from the following:   Height as of an earlier encounter on 11/15/18: 5' 6"  (1.676 m).   Weight as of an earlier encounter on 11/15/18: 127.9 kg.  Discharge Instructions  Discharge Instructions    Call MD for:  difficulty breathing, headache or visual disturbances   Complete  by: As directed    Call MD for:  persistant nausea and vomiting   Complete by: As directed    Diet - low sodium heart healthy   Complete by:  As directed    Increase activity slowly   Complete by: As directed      Allergies as of 11/17/2018   No Known Allergies     Medication List    STOP taking these medications   liraglutide 18 MG/3ML Sopn Commonly known as: Victoza     TAKE these medications   Accu-Chek FastClix Lancets Misc Use as directed   Accu-Chek Guide Me w/Device Kit 48 Units by Does not apply route 2 (two) times daily.   Accu-Chek Guide test strip Generic drug: glucose blood Use as instructed   albuterol 108 (90 Base) MCG/ACT inhaler Commonly known as: VENTOLIN HFA Inhale 1-2 puffs into the lungs every 6 (six) hours as needed for wheezing or shortness of breath.   aspirin EC 81 MG tablet Take 1 tablet (81 mg total) by mouth daily.   atorvastatin 20 MG tablet Commonly known as: LIPITOR Take 1 tablet (20 mg total) by mouth daily.   benztropine 0.5 MG tablet Commonly known as: COGENTIN Take 0.5 mg by mouth at bedtime.   buPROPion 300 MG 24 hr tablet Commonly known as: WELLBUTRIN XL Take 300 mg by mouth daily.   cetirizine 10 MG tablet Commonly known as: ZYRTEC TAKE ONE TABLET (10 MG) BY MOUTH EVERY DAY What changed: See the new instructions.   diclofenac sodium 1 % Gel Commonly known as: Voltaren Apply 2 g topically 4 (four) times daily. What changed:   when to take this  reasons to take this   fluticasone 50 MCG/ACT nasal spray Commonly known as: FLONASE Place 1-2 sprays into both nostrils daily for 7 days. What changed:   when to take this  reasons to take this   Insulin Glargine 100 UNIT/ML Solostar Pen Commonly known as: Lantus SoloStar Inject 48 Units into the skin 2 (two) times daily. What changed: how much to take   Insulin Pen Needle 31G X 8 MM Misc Use as directed for 4 times daily insulin administration.   Insulin Syringe-Needle U-100 31G X 15/64" 0.5 ML Misc Commonly known as: BD Insulin Syringe Ultrafine Use as directed   lisinopril 5 MG tablet Commonly  known as: ZESTRIL Take 1 tablet (5 mg total) by mouth daily.   metFORMIN 750 MG 24 hr tablet Commonly known as: GLUCOPHAGE-XR Take 750 mg by mouth 3 (three) times daily.   methocarbamol 500 MG tablet Commonly known as: Robaxin Take 2 tablets (1,000 mg total) by mouth 3 (three) times daily. X 10 days then prn muscle spasm What changed:   when to take this  reasons to take this  additional instructions   NEEDLE (DISP) 24 G 24G X 1" Misc Commonly known as: B-D DISP NEEDLE 24GX1" Use with solostar pen to inject 4 times daily   ondansetron 4 MG disintegrating tablet Commonly known as: Zofran ODT Take 1 tablet (4 mg total) by mouth every 8 (eight) hours as needed for nausea or vomiting.   pantoprazole 40 MG tablet Commonly known as: PROTONIX Take 1 tablet (40 mg total) by mouth daily.   phentermine 37.5 MG tablet Commonly known as: ADIPEX-P Take 37.5 mg by mouth daily.   traZODone 50 MG tablet Commonly known as: DESYREL Take 25-50 mg by mouth at bedtime as needed for sleep.      Follow-up Information  Bujanowski, Melissa, PA-C Follow up.   Specialty: Physician Assistant Contact information: [place] [id number] [phone]          No Known Allergies  Consultations:  none   Procedures/Studies: Ct Abdomen Pelvis W Contrast  Result Date: 11/15/2018 CLINICAL DATA:  Pancreatitis EXAM: CT ABDOMEN AND PELVIS WITH CONTRAST TECHNIQUE: Multidetector CT imaging of the abdomen and pelvis was performed using the standard protocol following bolus administration of intravenous contrast. CONTRAST:  138m OMNIPAQUE IOHEXOL 300 MG/ML SOLN, 335mOMNIPAQUE IOHEXOL 300 MG/ML SOLN COMPARISON:  11/04/2018 FINDINGS: Lower chest: The lung bases are clear. The heart size is normal. Hepatobiliary: There is decreased hepatic attenuation suggestive of hepatic steatosis. Status post cholecystectomy.There is no biliary ductal dilation. Pancreas: There is some mild  fat stranding about the pancreatic head, improved from prior study. No pancreatic pseudocyst. No drainable collection. Spleen: No splenic laceration or hematoma. Adrenals/Urinary Tract: --Adrenal glands: No adrenal hemorrhage. --Right kidney/ureter: No hydronephrosis or perinephric hematoma. --Left kidney/ureter: No hydronephrosis or perinephric hematoma. --Urinary bladder: Unremarkable. Stomach/Bowel: --Stomach/Duodenum: No hiatal hernia or other gastric abnormality. Normal duodenal course and caliber. --Small bowel: No dilatation or inflammation. --Colon: No focal abnormality. --Appendix: Normal. Vascular/Lymphatic: Normal course and caliber of the major abdominal vessels. --No retroperitoneal lymphadenopathy. --No mesenteric lymphadenopathy. --No pelvic or inguinal lymphadenopathy. Reproductive: There is an IUD in place. Other: No ascites or free air. The abdominal wall is normal. Musculoskeletal. No acute displaced fractures. IMPRESSION: 1. Mild fat stranding about the pancreatic head, improved from prior study. No evidence for pancreatic necrosis. No pseudocyst. 2. Hepatic steatosis. 3. Status post cholecystectomy. 4. Normal appendix in the right lower quadrant. Electronically Signed   By: ChConstance Holster.D.   On: 11/15/2018 21:46   Ct Abdomen Pelvis W Contrast  Result Date: 11/04/2018 CLINICAL DATA:  [age]ear old female with history of endometrial cancer presenting with generalized abdominal pain x2 days. EXAM: CT ABDOMEN AND PELVIS WITH CONTRAST TECHNIQUE: Multidetector CT imaging of the abdomen and pelvis was performed using the standard protocol following bolus administration of intravenous contrast. CONTRAST:  10019mMNIPAQUE IOHEXOL 300 MG/ML  SOLN COMPARISON:  CT of the abdomen pelvis dated 08/29/2018 FINDINGS: Lower chest: The visualized lung bases are clear. No intra-abdominal free air or free fluid. Hepatobiliary: There is diffuse fatty infiltration of the liver. No intrahepatic biliary  ductal dilatation. There is irregularity of the liver contour suggestive of early changes of cirrhosis. The liver is enlarged and measures approximately 18 cm in midclavicular length. Cholecystectomy. Pancreas: There is mild haziness of the head and uncinate process of the pancreas concerning for acute pancreatitis. Correlation with pancreatic enzymes recommended. No drainable fluid collection or abscess. Spleen: Normal in size without focal abnormality. Adrenals/Urinary Tract: The adrenal glands are unremarkable. There is no hydronephrosis on either side. A subcentimeter right renal hypodense lesion is suboptimally characterized but likely represents a cyst. The visualized ureters and urinary bladder appear unremarkable. Stomach/Bowel: There is no bowel obstruction or active inflammation. The appendix is normal. Vascular/Lymphatic: The abdominal aorta and IVC are unremarkable. No portal venous gas. The SMV, splenic vein, and main portal vein are patent. There is no adenopathy. Reproductive: The uterus is anteverted. An intrauterine device is noted. No adnexal masses. Other: None Musculoskeletal: Lower lumbar facet arthropathy. No acute osseous pathology. IMPRESSION: 1. Findings concerning for acute pancreatitis. Correlation with pancreatic enzymes recommended. No drainable fluid collection/abscess or pseudocyst. 2. No bowel obstruction or active inflammation. Normal appendix. 3. Fatty liver with evidence of early changes  of cirrhosis. Electronically Signed   By: Anner Crete M.D.   On: 11/04/2018 19:19    (Echo, Carotid, EGD, Colonoscopy, ERCP)    Subjective: Feels better tolerated food intake  Discharge Exam: Vitals:   11/17/18 0621 11/17/18 1339  BP: 109/82 134/80  Pulse: 74 71  Resp: 18 18  Temp: 97.7 F (36.5 C) 98.2 F (36.8 C)  SpO2: 97% 100%   Vitals:   11/16/18 1254 11/16/18 2042 11/17/18 0621 11/17/18 1339  BP: 135/82 132/82 109/82 134/80  Pulse: 66 71 74 71  Resp: 18 18 18 18    Temp: (!) 97.5 F (36.4 C) 98 F (36.7 C) 97.7 F (36.5 C) 98.2 F (36.8 C)  TempSrc: Oral Oral Oral Oral  SpO2: 97% 100% 97% 100%    General: Pt is alert, awake, not in acute distress Cardiovascular: RRR, S1/S2 +, no rubs, no gallops Respiratory: CTA bilaterally, no wheezing, no rhonchi Abdominal: Soft, NT, ND, bowel sounds + Extremities: no edema, no cyanosis    The results of significant diagnostics from this hospitalization (including imaging, microbiology, ancillary and laboratory) are listed below for reference.     Microbiology: No results found for this or any previous visit (from the past 240 hour(s)).   Labs: BNP (last 3 results) No results for input(s): BNP in the last 8760 hours. Basic Metabolic Panel: Recent Labs  Lab 11/15/18 1906 11/16/18 0343 11/16/18 1335 11/17/18 0326  NA 137 140  --  140  K 3.2* 3.3*  --  3.6  CL 97* 101  --  105  CO2 28 28  --  26  GLUCOSE 211* 146*  --  168*  BUN 8 6  --  <5*  CREATININE 0.83 0.68  --  0.56  CALCIUM 9.4 9.0  --  8.9  MG  --   --  2.4  --    Liver Function Tests: Recent Labs  Lab 11/15/18 1906 11/17/18 0326  AST 20 21  ALT 30 31  ALKPHOS 185* 161*  BILITOT 0.3 0.5  PROT 7.8 6.8  ALBUMIN 3.7 3.1*   Recent Labs  Lab 11/15/18 1906 11/17/18 0326  LIPASE 150* 84*   No results for input(s): AMMONIA in the last 168 hours. CBC: Recent Labs  Lab 11/15/18 1906 11/16/18 0343  WBC 11.2* 10.9*  NEUTROABS 5.7  --   HGB 13.5 12.4  HCT 42.7 39.5  MCV 87.9 88.2  PLT 299 275   Cardiac Enzymes: No results for input(s): CKTOTAL, CKMB, CKMBINDEX, TROPONINI in the last 168 hours. BNP: Invalid input(s): POCBNP CBG: Recent Labs  Lab 11/16/18 1133 11/16/18 1632 11/16/18 2257 11/17/18 0734 11/17/18 1201  GLUCAP 197* 231* 188* 166* 179*   D-Dimer No results for input(s): DDIMER in the last 72 hours. Hgb A1c No results for input(s): HGBA1C in the last 72 hours. Lipid Profile Recent Labs     11/17/18 0326  CHOL 146  HDL 30*  LDLCALC 94  TRIG 111  CHOLHDL 4.9   Thyroid function studies No results for input(s): TSH, T4TOTAL, T3FREE, THYROIDAB in the last 72 hours.  Invalid input(s): FREET3 Anemia work up No results for input(s): VITAMINB12, FOLATE, FERRITIN, TIBC, IRON, RETICCTPCT in the last 72 hours. Urinalysis    Component Value Date/Time   COLORURINE YELLOW 11/04/2018 1940   APPEARANCEUR CLEAR 11/04/2018 1940   LABSPEC <1.005 (L) 11/04/2018 1940   PHURINE 7.0 11/04/2018 1940   GLUCOSEU NEGATIVE 11/04/2018 1940   HGBUR SMALL (A) 11/04/2018 1940   BILIRUBINUR NEGATIVE  11/04/2018 1940   BILIRUBINUR negative 03/08/2018 1024   BILIRUBINUR neg 08/10/2016 1107   KETONESUR NEGATIVE 11/04/2018 1940   PROTEINUR NEGATIVE 11/04/2018 1940   UROBILINOGEN 1.0 09/15/2018 1128   NITRITE NEGATIVE 11/04/2018 1940   LEUKOCYTESUR NEGATIVE 11/04/2018 1940   Sepsis Labs Invalid input(s): PROCALCITONIN,  WBC,  LACTICIDVEN Microbiology No results found for this or any previous visit (from the past 240 hour(s)).   Time coordinating discharge:  38 minutes  SIGNED:   Georgette Shell, MD  [facility] 11/18/2018, 3:06 PM Pager   If 7PM-7AM, please contact night-coverage [email or web address] Password [email or web address]

## 2018-11-22 ENCOUNTER — Inpatient Hospital Stay: Payer: Medicaid Other | Admitting: Gynecologic Oncology

## 2018-11-27 ENCOUNTER — Ambulatory Visit: Payer: Medicaid Other | Admitting: Gynecologic Oncology

## 2018-11-27 NOTE — Progress Notes (Signed)
Addendum: Reviewed and agree with assessment and management plan. Orien Mayhall M, MD  

## 2018-12-03 ENCOUNTER — Telehealth: Payer: Self-pay | Admitting: Oncology

## 2018-12-03 DIAGNOSIS — E1169 Type 2 diabetes mellitus with other specified complication: Secondary | ICD-10-CM

## 2018-12-03 DIAGNOSIS — Z794 Long term (current) use of insulin: Secondary | ICD-10-CM

## 2018-12-03 NOTE — Telephone Encounter (Signed)
Tara Keller called and said she would like another referral to [facility] so that she can make an appointment.  Referral entered and Tara Keller was notified.

## 2018-12-11 ENCOUNTER — Ambulatory Visit: Payer: Medicaid Other | Admitting: Endocrinology

## 2018-12-26 ENCOUNTER — Ambulatory Visit: Payer: Medicaid Other | Admitting: Gynecologic Oncology

## 2019-01-20 ENCOUNTER — Other Ambulatory Visit: Payer: Self-pay

## 2019-01-20 ENCOUNTER — Inpatient Hospital Stay: Payer: Medicaid Other | Attending: Gynecologic Oncology | Admitting: Gynecologic Oncology

## 2019-01-20 ENCOUNTER — Encounter: Payer: Self-pay | Admitting: Gynecologic Oncology

## 2019-01-20 ENCOUNTER — Inpatient Hospital Stay: Payer: Medicaid Other

## 2019-01-20 VITALS — BP 141/87 | HR 188 | Temp 98.2°F | Resp 20 | Ht 66.0 in | Wt 286.0 lb

## 2019-01-20 DIAGNOSIS — K3184 Gastroparesis: Secondary | ICD-10-CM | POA: Diagnosis not present

## 2019-01-20 DIAGNOSIS — Z791 Long term (current) use of non-steroidal anti-inflammatories (NSAID): Secondary | ICD-10-CM | POA: Diagnosis not present

## 2019-01-20 DIAGNOSIS — K859 Acute pancreatitis without necrosis or infection, unspecified: Secondary | ICD-10-CM

## 2019-01-20 DIAGNOSIS — Z9181 History of falling: Secondary | ICD-10-CM | POA: Diagnosis not present

## 2019-01-20 DIAGNOSIS — C541 Malignant neoplasm of endometrium: Secondary | ICD-10-CM

## 2019-01-20 DIAGNOSIS — Z7982 Long term (current) use of aspirin: Secondary | ICD-10-CM | POA: Insufficient documentation

## 2019-01-20 DIAGNOSIS — F329 Major depressive disorder, single episode, unspecified: Secondary | ICD-10-CM | POA: Insufficient documentation

## 2019-01-20 DIAGNOSIS — F419 Anxiety disorder, unspecified: Secondary | ICD-10-CM | POA: Insufficient documentation

## 2019-01-20 DIAGNOSIS — Z6841 Body Mass Index (BMI) 40.0 and over, adult: Secondary | ICD-10-CM | POA: Diagnosis not present

## 2019-01-20 DIAGNOSIS — I517 Cardiomegaly: Secondary | ICD-10-CM | POA: Insufficient documentation

## 2019-01-20 DIAGNOSIS — I10 Essential (primary) hypertension: Secondary | ICD-10-CM | POA: Insufficient documentation

## 2019-01-20 DIAGNOSIS — E1169 Type 2 diabetes mellitus with other specified complication: Secondary | ICD-10-CM

## 2019-01-20 DIAGNOSIS — Z794 Long term (current) use of insulin: Secondary | ICD-10-CM

## 2019-01-20 DIAGNOSIS — Z9049 Acquired absence of other specified parts of digestive tract: Secondary | ICD-10-CM | POA: Diagnosis not present

## 2019-01-20 DIAGNOSIS — E1165 Type 2 diabetes mellitus with hyperglycemia: Secondary | ICD-10-CM

## 2019-01-20 DIAGNOSIS — M1990 Unspecified osteoarthritis, unspecified site: Secondary | ICD-10-CM | POA: Diagnosis not present

## 2019-01-20 DIAGNOSIS — G4730 Sleep apnea, unspecified: Secondary | ICD-10-CM | POA: Diagnosis not present

## 2019-01-20 DIAGNOSIS — F17210 Nicotine dependence, cigarettes, uncomplicated: Secondary | ICD-10-CM | POA: Insufficient documentation

## 2019-01-20 DIAGNOSIS — E785 Hyperlipidemia, unspecified: Secondary | ICD-10-CM | POA: Diagnosis not present

## 2019-01-20 DIAGNOSIS — Z79899 Other long term (current) drug therapy: Secondary | ICD-10-CM | POA: Insufficient documentation

## 2019-01-20 DIAGNOSIS — M549 Dorsalgia, unspecified: Secondary | ICD-10-CM | POA: Insufficient documentation

## 2019-01-20 DIAGNOSIS — IMO0002 Reserved for concepts with insufficient information to code with codable children: Secondary | ICD-10-CM

## 2019-01-20 DIAGNOSIS — K760 Fatty (change of) liver, not elsewhere classified: Secondary | ICD-10-CM | POA: Insufficient documentation

## 2019-01-20 LAB — HEMOGLOBIN A1C
Hgb A1c MFr Bld: 11.1 % — ABNORMAL HIGH (ref 4.8–5.6)
Mean Plasma Glucose: 271.87 mg/dL

## 2019-01-20 LAB — COMPREHENSIVE METABOLIC PANEL
ALT: 24 U/L (ref 0–44)
AST: 12 U/L — ABNORMAL LOW (ref 15–41)
Albumin: 3.7 g/dL (ref 3.5–5.0)
Alkaline Phosphatase: 223 U/L — ABNORMAL HIGH (ref 38–126)
Anion gap: 11 (ref 5–15)
BUN: 7 mg/dL (ref 6–20)
CO2: 27 mmol/L (ref 22–32)
Calcium: 9.4 mg/dL (ref 8.9–10.3)
Chloride: 99 mmol/L (ref 98–111)
Creatinine, Ser: 0.89 mg/dL (ref 0.44–1.00)
GFR calc Af Amer: >60 mL/min (ref >60)
GFR calc non Af Amer: >60 mL/min (ref >60)
Glucose, Bld: 300 mg/dL — ABNORMAL HIGH (ref 70–99)
Potassium: 3.6 mmol/L (ref 3.5–5.1)
Sodium: 137 mmol/L (ref 135–145)
Total Bilirubin: 0.3 mg/dL (ref 0.3–1.2)
Total Protein: 8.1 g/dL (ref 6.5–8.1)

## 2019-01-20 LAB — LIPASE, BLOOD: Lipase: 25 U/L (ref 11–51)

## 2019-01-20 MED ORDER — ONDANSETRON 4 MG PO TBDP: 4.0000 mg | ORAL_TABLET | Freq: Three times a day (TID) | ORAL | 0 refills | Status: DC | PRN

## 2019-01-20 NOTE — Patient Instructions (Signed)
Please return to see Dr Denman George as scheduled in 3 months for a biopsy. Take 2 tablets (400mg ) of advil 20 minutes before your appointment.
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
## 2019-01-23 ENCOUNTER — Telehealth: Payer: Self-pay

## 2019-01-23 NOTE — Telephone Encounter (Signed)
Told Tara Keller that her Hgb A1c is improving. It was 11.1.  It is improving. [name] wants her to keep working on her blood sugar control. The lipase level wa WNL at 25 and her Blood sugar on 01-20-19 was high at 300. Keep f/u appointment in 3 months as scheduled for an endometrial biopsy. Pt verbalized understanding.

## 2019-02-18 ENCOUNTER — Encounter (HOSPITAL_COMMUNITY): Payer: Self-pay

## 2019-02-18 ENCOUNTER — Ambulatory Visit (HOSPITAL_COMMUNITY)
Admission: EM | Admit: 2019-02-18 | Discharge: 2019-02-18 | Disposition: A | Discharge status: Home / Self Care | Payer: Medicaid Other | Attending: Family Medicine | Admitting: Family Medicine

## 2019-02-18 ENCOUNTER — Other Ambulatory Visit: Payer: Self-pay

## 2019-02-18 DIAGNOSIS — Z833 Family history of diabetes mellitus: Secondary | ICD-10-CM | POA: Insufficient documentation

## 2019-02-18 DIAGNOSIS — F411 Generalized anxiety disorder: Secondary | ICD-10-CM | POA: Diagnosis not present

## 2019-02-18 DIAGNOSIS — M5416 Radiculopathy, lumbar region: Secondary | ICD-10-CM | POA: Insufficient documentation

## 2019-02-18 DIAGNOSIS — G4733 Obstructive sleep apnea (adult) (pediatric): Secondary | ICD-10-CM | POA: Insufficient documentation

## 2019-02-18 DIAGNOSIS — Z9049 Acquired absence of other specified parts of digestive tract: Secondary | ICD-10-CM | POA: Insufficient documentation

## 2019-02-18 DIAGNOSIS — Z800 Family history of malignant neoplasm of digestive organs: Secondary | ICD-10-CM | POA: Insufficient documentation

## 2019-02-18 DIAGNOSIS — F1021 Alcohol dependence, in remission: Secondary | ICD-10-CM | POA: Insufficient documentation

## 2019-02-18 DIAGNOSIS — Z8249 Family history of ischemic heart disease and other diseases of the circulatory system: Secondary | ICD-10-CM | POA: Diagnosis not present

## 2019-02-18 DIAGNOSIS — Z792 Long term (current) use of antibiotics: Secondary | ICD-10-CM | POA: Insufficient documentation

## 2019-02-18 DIAGNOSIS — M1990 Unspecified osteoarthritis, unspecified site: Secondary | ICD-10-CM | POA: Diagnosis not present

## 2019-02-18 DIAGNOSIS — Z8042 Family history of malignant neoplasm of prostate: Secondary | ICD-10-CM | POA: Insufficient documentation

## 2019-02-18 DIAGNOSIS — Z9851 Tubal ligation status: Secondary | ICD-10-CM | POA: Insufficient documentation

## 2019-02-18 DIAGNOSIS — Z20822 Contact with and (suspected) exposure to covid-19: Secondary | ICD-10-CM | POA: Diagnosis not present

## 2019-02-18 DIAGNOSIS — F329 Major depressive disorder, single episode, unspecified: Secondary | ICD-10-CM | POA: Diagnosis not present

## 2019-02-18 DIAGNOSIS — J0101 Acute recurrent maxillary sinusitis: Secondary | ICD-10-CM | POA: Diagnosis present

## 2019-02-18 DIAGNOSIS — Z803 Family history of malignant neoplasm of breast: Secondary | ICD-10-CM | POA: Insufficient documentation

## 2019-02-18 DIAGNOSIS — Z6841 Body Mass Index (BMI) 40.0 and over, adult: Secondary | ICD-10-CM | POA: Insufficient documentation

## 2019-02-18 DIAGNOSIS — K219 Gastro-esophageal reflux disease without esophagitis: Secondary | ICD-10-CM | POA: Insufficient documentation

## 2019-02-18 DIAGNOSIS — J45909 Unspecified asthma, uncomplicated: Secondary | ICD-10-CM | POA: Diagnosis not present

## 2019-02-18 DIAGNOSIS — E1143 Type 2 diabetes mellitus with diabetic autonomic (poly)neuropathy: Secondary | ICD-10-CM | POA: Diagnosis not present

## 2019-02-18 DIAGNOSIS — E785 Hyperlipidemia, unspecified: Secondary | ICD-10-CM | POA: Insufficient documentation

## 2019-02-18 DIAGNOSIS — Z794 Long term (current) use of insulin: Secondary | ICD-10-CM | POA: Diagnosis not present

## 2019-02-18 DIAGNOSIS — Z7982 Long term (current) use of aspirin: Secondary | ICD-10-CM | POA: Insufficient documentation

## 2019-02-18 DIAGNOSIS — Z823 Family history of stroke: Secondary | ICD-10-CM | POA: Insufficient documentation

## 2019-02-18 DIAGNOSIS — J3089 Other allergic rhinitis: Secondary | ICD-10-CM | POA: Diagnosis present

## 2019-02-18 DIAGNOSIS — F17210 Nicotine dependence, cigarettes, uncomplicated: Secondary | ICD-10-CM | POA: Insufficient documentation

## 2019-02-18 DIAGNOSIS — Z8542 Personal history of malignant neoplasm of other parts of uterus: Secondary | ICD-10-CM | POA: Insufficient documentation

## 2019-02-18 DIAGNOSIS — Z79899 Other long term (current) drug therapy: Secondary | ICD-10-CM | POA: Insufficient documentation

## 2019-02-18 DIAGNOSIS — Z8541 Personal history of malignant neoplasm of cervix uteri: Secondary | ICD-10-CM | POA: Insufficient documentation

## 2019-02-18 MED ORDER — CETIRIZINE HCL 10 MG PO TABS: 10.0000 mg | ORAL_TABLET | Freq: Every day | ORAL | 1 refills | Status: DC

## 2019-02-18 MED ORDER — BENZONATATE 100 MG PO CAPS: 100.0000 mg | ORAL_CAPSULE | Freq: Three times a day (TID) | ORAL | 0 refills | Status: DC

## 2019-02-18 MED ORDER — AMOXICILLIN-POT CLAVULANATE 875-125 MG PO TABS: 1.0000 | ORAL_TABLET | Freq: Two times a day (BID) | ORAL | 0 refills | Status: DC
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
## 2019-02-18 NOTE — ED Triage Notes (Signed)
Pt state she has sinus pressure and drainage x 4 days. Pt state has tried some OTC meds.

## 2019-02-18 NOTE — Discharge Instructions (Addendum)
Treating you for a sinus infection Flonase 2 sprays in each nostril daily Zyrtec daily Augmentin twice a day for 7 days Follow-up with ear nose and throat specialist

## 2019-02-20 LAB — NOVEL CORONAVIRUS, NAA (HOSP ORDER, SEND-OUT TO REF LAB; TAT 18-24 HRS): SARS-CoV-2, NAA: NOT DETECTED

## 2019-02-28 DIAGNOSIS — J324 Chronic pansinusitis: Secondary | ICD-10-CM | POA: Insufficient documentation

## 2019-03-05 ENCOUNTER — Ambulatory Visit: Payer: Medicaid Other | Admitting: Podiatry

## 2019-03-05 ENCOUNTER — Other Ambulatory Visit: Payer: Self-pay

## 2019-03-05 ENCOUNTER — Encounter: Payer: Self-pay | Admitting: Podiatry

## 2019-03-05 DIAGNOSIS — M79674 Pain in right toe(s): Secondary | ICD-10-CM

## 2019-03-05 DIAGNOSIS — M79675 Pain in left toe(s): Secondary | ICD-10-CM

## 2019-03-05 DIAGNOSIS — E1149 Type 2 diabetes mellitus with other diabetic neurological complication: Secondary | ICD-10-CM

## 2019-03-05 DIAGNOSIS — L089 Local infection of the skin and subcutaneous tissue, unspecified: Secondary | ICD-10-CM

## 2019-03-05 DIAGNOSIS — B351 Tinea unguium: Secondary | ICD-10-CM | POA: Diagnosis not present

## 2019-03-05 DIAGNOSIS — S90822A Blister (nonthermal), left foot, initial encounter: Secondary | ICD-10-CM

## 2019-03-05 NOTE — Addendum Note (Signed)
Addended by: Thereasa Distance on: 03/05/2019 05:08 PM   Modules accepted: Orders

## 2019-03-05 NOTE — Progress Notes (Signed)
Complaint:  Visit Type: Patient returns to my office for continued preventative foot care services. Complaint: Patient states" my nails have grown long and thick and become painful to walk and wear shoes" Patient has been diagnosed with DM with neuropathy. The patient presents for preventative foot care services. Patient has not been seen in over 2 years.  Podiatric Exam: Vascular: dorsalis pedis and posterior tibial pulses are palpable bilateral. Capillary return is immediate. Temperature gradient is WNL. Skin turgor WNL  Sensorium: Diminished  Semmes Weinstein monofilament test. Normal tactile sensation bilaterally. Nail Exam: Pt has thick disfigured discolored nails with subungual debris noted bilateral entire nail hallux through fifth toenails Ulcer Exam: There is no evidence of ulcer or pre-ulcerative changes or infection. Orthopedic Exam: Muscle tone and strength are WNL. No limitations in general ROM. No crepitus or effusions noted. Foot type and digits show no abnormalities. Bony prominences are unremarkable. Skin: No Porokeratosis. No infection or ulcers  Diagnosis:  Onychomycosis, , Pain in right toe, pain in left toes  Treatment & Plan Procedures and Treatment: Consent by patient was obtained for treatment procedures.   Debridement of mycotic and hypertrophic toenails, 1 through 5 bilateral and clearing of subungual debris. No ulceration, no infection noted.  Return Visit-Office Procedure: Patient instructed to return to the office for a follow up visit 3 months for continued evaluation and treatment.    Gardiner Barefoot DPM

## 2019-04-04 ENCOUNTER — Telehealth: Payer: Self-pay | Admitting: Internal Medicine

## 2019-04-04 NOTE — Telephone Encounter (Signed)
Tara Keller- you last saw [name] 11/2018 (Dr Hilarie Fredrickson last saw patient in 2018). This patient is requesting rx for pantoprazole... We last filled this rx for her in 2018 and past that, another physician was filling. Are you okay rx'ing the prescription since you last saw patient within the year, or do you want her to see Dr Hilarie Fredrickson again?

## 2019-04-07 MED ORDER — PANTOPRAZOLE SODIUM 40 MG PO TBEC: 40.0000 mg | DELAYED_RELEASE_TABLET | Freq: Every day | ORAL | 6 refills | Status: DC

## 2019-04-07 NOTE — Telephone Encounter (Signed)
Rx sent 

## 2019-04-07 NOTE — Telephone Encounter (Signed)
Ok to refill.  Can put enough refills to last until 11/2019 at which time she would need to be seen again to continue prescription.

## 2019-04-22 ENCOUNTER — Encounter: Payer: Self-pay | Admitting: Gynecologic Oncology

## 2019-04-22 ENCOUNTER — Inpatient Hospital Stay: Payer: Medicaid Other

## 2019-04-22 ENCOUNTER — Encounter: Payer: Self-pay | Admitting: Oncology

## 2019-04-22 ENCOUNTER — Other Ambulatory Visit: Payer: Self-pay

## 2019-04-22 ENCOUNTER — Inpatient Hospital Stay: Payer: Medicaid Other | Attending: Gynecologic Oncology | Admitting: Gynecologic Oncology

## 2019-04-22 VITALS — BP 141/89 | HR 82 | Temp 98.9°F | Resp 17 | Ht 66.0 in | Wt 296.6 lb

## 2019-04-22 DIAGNOSIS — Z975 Presence of (intrauterine) contraceptive device: Secondary | ICD-10-CM | POA: Diagnosis not present

## 2019-04-22 DIAGNOSIS — J45909 Unspecified asthma, uncomplicated: Secondary | ICD-10-CM | POA: Diagnosis not present

## 2019-04-22 DIAGNOSIS — Z791 Long term (current) use of non-steroidal anti-inflammatories (NSAID): Secondary | ICD-10-CM | POA: Insufficient documentation

## 2019-04-22 DIAGNOSIS — Z8042 Family history of malignant neoplasm of prostate: Secondary | ICD-10-CM | POA: Diagnosis not present

## 2019-04-22 DIAGNOSIS — Z833 Family history of diabetes mellitus: Secondary | ICD-10-CM | POA: Insufficient documentation

## 2019-04-22 DIAGNOSIS — F419 Anxiety disorder, unspecified: Secondary | ICD-10-CM | POA: Diagnosis not present

## 2019-04-22 DIAGNOSIS — Z803 Family history of malignant neoplasm of breast: Secondary | ICD-10-CM | POA: Insufficient documentation

## 2019-04-22 DIAGNOSIS — Z794 Long term (current) use of insulin: Secondary | ICD-10-CM | POA: Insufficient documentation

## 2019-04-22 DIAGNOSIS — E1169 Type 2 diabetes mellitus with other specified complication: Secondary | ICD-10-CM

## 2019-04-22 DIAGNOSIS — Z8542 Personal history of malignant neoplasm of other parts of uterus: Secondary | ICD-10-CM | POA: Insufficient documentation

## 2019-04-22 DIAGNOSIS — E785 Hyperlipidemia, unspecified: Secondary | ICD-10-CM | POA: Insufficient documentation

## 2019-04-22 DIAGNOSIS — Z6841 Body Mass Index (BMI) 40.0 and over, adult: Secondary | ICD-10-CM

## 2019-04-22 DIAGNOSIS — F329 Major depressive disorder, single episode, unspecified: Secondary | ICD-10-CM | POA: Insufficient documentation

## 2019-04-22 DIAGNOSIS — F17210 Nicotine dependence, cigarettes, uncomplicated: Secondary | ICD-10-CM | POA: Diagnosis not present

## 2019-04-22 DIAGNOSIS — G4730 Sleep apnea, unspecified: Secondary | ICD-10-CM | POA: Insufficient documentation

## 2019-04-22 DIAGNOSIS — Z8249 Family history of ischemic heart disease and other diseases of the circulatory system: Secondary | ICD-10-CM | POA: Diagnosis not present

## 2019-04-22 DIAGNOSIS — Z800 Family history of malignant neoplasm of digestive organs: Secondary | ICD-10-CM | POA: Insufficient documentation

## 2019-04-22 DIAGNOSIS — E1165 Type 2 diabetes mellitus with hyperglycemia: Secondary | ICD-10-CM | POA: Insufficient documentation

## 2019-04-22 DIAGNOSIS — Z79899 Other long term (current) drug therapy: Secondary | ICD-10-CM | POA: Insufficient documentation

## 2019-04-22 DIAGNOSIS — E1143 Type 2 diabetes mellitus with diabetic autonomic (poly)neuropathy: Secondary | ICD-10-CM | POA: Insufficient documentation

## 2019-04-22 DIAGNOSIS — Z8541 Personal history of malignant neoplasm of cervix uteri: Secondary | ICD-10-CM | POA: Diagnosis not present

## 2019-04-22 DIAGNOSIS — Z7982 Long term (current) use of aspirin: Secondary | ICD-10-CM | POA: Insufficient documentation

## 2019-04-22 DIAGNOSIS — C541 Malignant neoplasm of endometrium: Secondary | ICD-10-CM

## 2019-04-22 LAB — BASIC METABOLIC PANEL
Anion gap: 8 (ref 5–15)
BUN: 5 mg/dL — ABNORMAL LOW (ref 6–20)
CO2: 31 mmol/L (ref 22–32)
Calcium: 9.3 mg/dL (ref 8.9–10.3)
Chloride: 99 mmol/L (ref 98–111)
Creatinine, Ser: 0.84 mg/dL (ref 0.44–1.00)
GFR calc Af Amer: >60 mL/min (ref >60)
GFR calc non Af Amer: >60 mL/min (ref >60)
Glucose, Bld: 327 mg/dL — ABNORMAL HIGH (ref 70–99)
Potassium: 3.2 mmol/L — ABNORMAL LOW (ref 3.5–5.1)
Sodium: 138 mmol/L (ref 135–145)

## 2019-04-22 NOTE — Addendum Note (Signed)
Addended by: Thereasa Solo on: 04/22/2019 02:19 PM   Modules accepted: Orders

## 2019-04-22 NOTE — Progress Notes (Signed)
Left a message for the referral coordinator at [facility] regarding appointment for patient.  Requested a return call.
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
## 2019-04-22 NOTE — Patient Instructions (Signed)
Dr Denman George recommends that you do the following adjustments for your diet:   - eliminate all drinks other than water (it is important to keep well hydrated and drink lots of water).  - eliminate all added sugars or sweeteners in your diet. While artificial sweeteners (including plant based non sugar sweeteners) are better than sugar, they are still not as good as a sweet-free diet. - eliminate or minimize white foods from your diet (even if they no longer look white in color after food preparation) such as cereals (corn flakes), bread, rice, potatoes.  - maximize slow releasing foods such as oatmeal, whole grain breads, uncooked vegetables (without dressings which actually contain a lot of sugars).  Dr Serita Grit office will help you to get connected with a diabetes educator and a primary care doctor to help with your diabetes.  Dr Denman George will order a blood test to check kidney function and your diabetes test.  Dr Denman George will order a CT scan of your abdomen if the kidney function permits.  Please contact Dr Serita Grit office (at [phone]) in July to request an appointment with her for October, 2021.

## 2019-04-23 ENCOUNTER — Telehealth: Payer: Self-pay | Admitting: *Deleted

## 2019-04-23 ENCOUNTER — Other Ambulatory Visit: Payer: Self-pay | Admitting: Gynecologic Oncology

## 2019-04-23 DIAGNOSIS — C541 Malignant neoplasm of endometrium: Secondary | ICD-10-CM

## 2019-04-23 DIAGNOSIS — R1030 Lower abdominal pain, unspecified: Secondary | ICD-10-CM

## 2019-04-23 LAB — HEMOGLOBIN A1C
Hgb A1c MFr Bld: 13 % — ABNORMAL HIGH (ref 4.8–5.6)
Mean Plasma Glucose: 326 mg/dL

## 2019-04-23 LAB — SURGICAL PATHOLOGY

## 2019-04-23 NOTE — Telephone Encounter (Signed)
I spoke with Tara Keller regarding her lab results from yesterday. Pt notified that her kidney function is WNL and that we can proceed with a CT. Date and time of CT reviewed with pt along with need for pt to pick up contrast from the cancer center. Potassium level also reviewed w/ patient. Pt encouraged to increase her potassium intake through her diet. Potassium rich foods which are also low in carbohydrates reviewed with patient. Pt verbalized understanding.

## 2019-04-23 NOTE — Progress Notes (Signed)
Talked with [facility] and they said Tara Keller would need to fill out a new patient packet and then they would call her in 1-2 weeks for an appointment.  [employer] and gave her the address.  She said she will stop by and fill out the packet.  She was also advised of her glucose and HGB A1C results.

## 2019-04-23 NOTE — Progress Notes (Signed)
CT per [name] to evaluate abdominal pain and evaluate for evidence of metastatic disease.

## 2019-04-25 ENCOUNTER — Telehealth: Payer: Self-pay

## 2019-04-25 NOTE — Telephone Encounter (Signed)
Told Ms Cobin that the pathology showed no cancer and looks like the IUD is working per Joylene John, NP. Keep CT Abdomen and Pelvis appointment on 04-29-19. Pt verbalized understanding.

## 2019-04-28 ENCOUNTER — Telehealth: Payer: Self-pay | Admitting: Oncology

## 2019-04-28 ENCOUNTER — Telehealth: Payer: Self-pay

## 2019-04-28 NOTE — Telephone Encounter (Signed)
Pt was scheduled 12/2018 by Bonnita Nasuti, please advise.

## 2019-04-28 NOTE — Telephone Encounter (Signed)
Please look for sooner appt. If available.

## 2019-04-28 NOTE — Telephone Encounter (Signed)
TC from [facility].  They are calling to see if patient can be seen sooner than her Sept new patient appointment? Reason is A1c is 13.  Santiago Glad can be reached at [phone]

## 2019-04-28 NOTE — Telephone Encounter (Signed)
Left a message for [facility] to see if September appointment can be moved up due to HGB A1C of 13.

## 2019-04-29 ENCOUNTER — Other Ambulatory Visit: Payer: Self-pay

## 2019-04-29 ENCOUNTER — Ambulatory Visit (HOSPITAL_COMMUNITY)
Admission: RE | Admit: 2019-04-29 | Discharge: 2019-04-29 | Disposition: A | Discharge status: Home / Self Care | Payer: Medicaid Other | Source: Ambulatory Visit | Attending: Gynecologic Oncology | Admitting: Gynecologic Oncology

## 2019-04-29 DIAGNOSIS — R1030 Lower abdominal pain, unspecified: Secondary | ICD-10-CM | POA: Diagnosis present

## 2019-04-29 DIAGNOSIS — C541 Malignant neoplasm of endometrium: Secondary | ICD-10-CM | POA: Insufficient documentation

## 2019-04-29 MED ORDER — IOHEXOL 300 MG/ML  SOLN
100.0000 mL | Freq: Once | INTRAMUSCULAR | Status: AC | PRN
  Administered 2019-04-29: 100 mL via INTRAVENOUS

## 2019-04-29 MED ORDER — SODIUM CHLORIDE (PF) 0.9 % IJ SOLN
INTRAMUSCULAR | Status: AC
  Filled 2019-04-29: qty 50

## 2019-04-30 ENCOUNTER — Telehealth: Payer: Self-pay

## 2019-04-30 NOTE — Telephone Encounter (Signed)
Told Tara Keller that the CT shows no acute findings. No gross signs of cancer spread and IUD is in place. She has an enlarged fatty liver per Melissa Cross,NP. She can discuss this with Juluis Mire, NP  at her New Patient appointment  Appointment on 05-08-19. A copy of CT report sent to Tara Oletta Lamas to review for appointment. Tara Keller verbalized understanding.

## 2019-04-30 NOTE — Telephone Encounter (Signed)
LM to call the office to discus the results of her CT scan.

## 2019-05-08 ENCOUNTER — Other Ambulatory Visit: Payer: Self-pay

## 2019-05-08 ENCOUNTER — Telehealth (INDEPENDENT_AMBULATORY_CARE_PROVIDER_SITE_OTHER): Payer: Medicaid Other | Admitting: Primary Care

## 2019-05-08 DIAGNOSIS — Z794 Long term (current) use of insulin: Secondary | ICD-10-CM | POA: Diagnosis not present

## 2019-05-08 DIAGNOSIS — K3184 Gastroparesis: Secondary | ICD-10-CM | POA: Diagnosis not present

## 2019-05-08 DIAGNOSIS — E1169 Type 2 diabetes mellitus with other specified complication: Secondary | ICD-10-CM | POA: Diagnosis not present
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
## 2019-05-08 NOTE — Progress Notes (Signed)
Pt has not checked CBG today  Bp today 129/83 with medication

## 2019-05-09 NOTE — Telephone Encounter (Signed)
There was an error in scheduling this patient she was originally scheduled to see Dr. Loanne Drilling on 12/11/18 and is was canceled-when patient was rescheduled she was put with Dr. Kelton Pillar- after reviewing I do not see an opportunity to schedule this patient any sooner-please advise

## 2019-05-09 NOTE — Telephone Encounter (Signed)
Please help with finding sooner appointment.

## 2019-05-14 NOTE — Telephone Encounter (Signed)
Would it be possible to move this patients appointment up due to elevated A1C-please advise

## 2019-05-14 NOTE — Telephone Encounter (Signed)
The approval would have to come from the MD to move the pt up as all MDs in this office are now out to either November or December.

## 2019-05-27 ENCOUNTER — Ambulatory Visit: Payer: Medicaid Other | Admitting: Dietician

## 2019-06-04 ENCOUNTER — Encounter: Payer: Self-pay | Admitting: Podiatry

## 2019-06-04 ENCOUNTER — Other Ambulatory Visit: Payer: Self-pay

## 2019-06-04 ENCOUNTER — Ambulatory Visit (INDEPENDENT_AMBULATORY_CARE_PROVIDER_SITE_OTHER): Payer: Medicaid Other | Admitting: Podiatry

## 2019-06-04 DIAGNOSIS — M79674 Pain in right toe(s): Secondary | ICD-10-CM | POA: Diagnosis not present

## 2019-06-04 DIAGNOSIS — B351 Tinea unguium: Secondary | ICD-10-CM

## 2019-06-04 DIAGNOSIS — M79675 Pain in left toe(s): Secondary | ICD-10-CM

## 2019-06-04 DIAGNOSIS — E1149 Type 2 diabetes mellitus with other diabetic neurological complication: Secondary | ICD-10-CM | POA: Diagnosis not present

## 2019-06-04 NOTE — Progress Notes (Signed)
Complaint:  Visit Type: Patient returns to my office for continued preventative foot care services. Complaint: Patient states" my nails have grown long and thick and become painful to walk and wear shoes" Patient has been diagnosed with DM with neuropathy. The patient presents for preventative foot care services.  Podiatric Exam: Vascular: dorsalis pedis and posterior tibial pulses are palpable bilateral. Capillary return is immediate. Temperature gradient is WNL. Skin turgor WNL  Sensorium: Diminished  Semmes Weinstein monofilament test. Normal tactile sensation bilaterally. Nail Exam: Pt has thick disfigured discolored nails with subungual debris noted bilateral entire nail hallux through fifth toenails Ulcer Exam: There is no evidence of ulcer or pre-ulcerative changes or infection. Orthopedic Exam: Muscle tone and strength are WNL. No limitations in general ROM. No crepitus or effusions noted. Foot type and digits show no abnormalities. Bony prominences are unremarkable. Skin: No Porokeratosis. No infection or ulcers  Diagnosis:  Onychomycosis, , Pain in right toe, pain in left toes  Treatment & Plan Procedures and Treatment: Consent by patient was obtained for treatment procedures.   Debridement of mycotic and hypertrophic toenails, 1 through 5 bilateral and clearing of subungual debris. No ulceration, no infection noted.  Return Visit-Office Procedure: Patient instructed to return to the office for a follow up visit 3 months for continued evaluation and treatment.    Seville Brick DPM 

## 2019-06-17 ENCOUNTER — Emergency Department (HOSPITAL_COMMUNITY)
Admission: EM | Admit: 2019-06-17 | Discharge: 2019-06-18 | Disposition: A | Discharge status: Home / Self Care | Payer: Medicaid Other | Attending: Emergency Medicine | Admitting: Emergency Medicine

## 2019-06-17 ENCOUNTER — Other Ambulatory Visit: Payer: Self-pay

## 2019-06-17 ENCOUNTER — Telehealth: Payer: Self-pay | Admitting: Gastroenterology

## 2019-06-17 ENCOUNTER — Encounter (HOSPITAL_COMMUNITY): Payer: Self-pay | Admitting: Emergency Medicine

## 2019-06-17 DIAGNOSIS — I10 Essential (primary) hypertension: Secondary | ICD-10-CM | POA: Diagnosis not present

## 2019-06-17 DIAGNOSIS — R1084 Generalized abdominal pain: Secondary | ICD-10-CM | POA: Diagnosis not present

## 2019-06-17 DIAGNOSIS — Z8541 Personal history of malignant neoplasm of cervix uteri: Secondary | ICD-10-CM | POA: Diagnosis not present

## 2019-06-17 DIAGNOSIS — Z794 Long term (current) use of insulin: Secondary | ICD-10-CM | POA: Diagnosis not present

## 2019-06-17 DIAGNOSIS — F17210 Nicotine dependence, cigarettes, uncomplicated: Secondary | ICD-10-CM | POA: Diagnosis not present

## 2019-06-17 DIAGNOSIS — E876 Hypokalemia: Secondary | ICD-10-CM | POA: Diagnosis not present

## 2019-06-17 DIAGNOSIS — R059 Cough, unspecified: Secondary | ICD-10-CM

## 2019-06-17 DIAGNOSIS — R1010 Upper abdominal pain, unspecified: Secondary | ICD-10-CM

## 2019-06-17 DIAGNOSIS — Z7982 Long term (current) use of aspirin: Secondary | ICD-10-CM | POA: Diagnosis not present

## 2019-06-17 DIAGNOSIS — E1140 Type 2 diabetes mellitus with diabetic neuropathy, unspecified: Secondary | ICD-10-CM | POA: Insufficient documentation

## 2019-06-17 DIAGNOSIS — R05 Cough: Secondary | ICD-10-CM | POA: Diagnosis not present

## 2019-06-17 DIAGNOSIS — Z79899 Other long term (current) drug therapy: Secondary | ICD-10-CM | POA: Insufficient documentation

## 2019-06-17 DIAGNOSIS — R748 Abnormal levels of other serum enzymes: Secondary | ICD-10-CM | POA: Diagnosis not present

## 2019-06-17 LAB — CBC
HCT: 44 % (ref 36.0–46.0)
Hemoglobin: 14 g/dL (ref 12.0–15.0)
MCH: 26.9 pg (ref 26.0–34.0)
MCHC: 31.8 g/dL (ref 30.0–36.0)
MCV: 84.6 fL (ref 80.0–100.0)
Platelets: 314 10*3/uL (ref 150–400)
RBC: 5.2 MIL/uL — ABNORMAL HIGH (ref 3.87–5.11)
RDW: 12.9 % (ref 11.5–15.5)
WBC: 9.5 10*3/uL (ref 4.0–10.5)
nRBC: 0 % (ref 0.0–0.2)

## 2019-06-17 LAB — COMPREHENSIVE METABOLIC PANEL
ALT: 36 U/L (ref 0–44)
AST: 29 U/L (ref 15–41)
Albumin: 3.4 g/dL — ABNORMAL LOW (ref 3.5–5.0)
Alkaline Phosphatase: 240 U/L — ABNORMAL HIGH (ref 38–126)
Anion gap: 11 (ref 5–15)
BUN: 8 mg/dL (ref 6–20)
CO2: 25 mmol/L (ref 22–32)
Calcium: 9.4 mg/dL (ref 8.9–10.3)
Chloride: 97 mmol/L — ABNORMAL LOW (ref 98–111)
Creatinine, Ser: 0.77 mg/dL (ref 0.44–1.00)
GFR calc Af Amer: >60 mL/min (ref >60)
GFR calc non Af Amer: >60 mL/min (ref >60)
Glucose, Bld: 313 mg/dL — ABNORMAL HIGH (ref 70–99)
Potassium: 3.4 mmol/L — ABNORMAL LOW (ref 3.5–5.1)
Sodium: 133 mmol/L — ABNORMAL LOW (ref 135–145)
Total Bilirubin: 0.3 mg/dL (ref 0.3–1.2)
Total Protein: 7.7 g/dL (ref 6.5–8.1)

## 2019-06-17 LAB — URINALYSIS, ROUTINE W REFLEX MICROSCOPIC
Bacteria, UA: NONE SEEN
Bilirubin Urine: NEGATIVE
Glucose, UA: >=500 mg/dL — AB
Ketones, ur: NEGATIVE mg/dL
Leukocytes,Ua: NEGATIVE
Nitrite: NEGATIVE
Protein, ur: NEGATIVE mg/dL
Specific Gravity, Urine: 1.018 (ref 1.005–1.030)
pH: 6 (ref 5.0–8.0)

## 2019-06-17 LAB — LIPASE, BLOOD: Lipase: 79 U/L — ABNORMAL HIGH (ref 11–51)

## 2019-06-17 MED ORDER — POTASSIUM CHLORIDE CRYS ER 20 MEQ PO TBCR
40.0000 meq | EXTENDED_RELEASE_TABLET | Freq: Once | ORAL | Status: AC
  Administered 2019-06-18: 40 meq via ORAL
  Filled 2019-06-17: qty 2

## 2019-06-17 MED ORDER — SODIUM CHLORIDE 0.9% FLUSH: 3.0000 mL | Freq: Once | INTRAVENOUS | Status: DC

## 2019-06-17 MED ORDER — MORPHINE SULFATE (PF) 4 MG/ML IV SOLN
4.0000 mg | Freq: Once | INTRAVENOUS | Status: AC
  Administered 2019-06-17: 4 mg via INTRAVENOUS
  Filled 2019-06-17: qty 1

## 2019-06-17 MED ORDER — SODIUM CHLORIDE 0.9 % IV BOLUS
1000.0000 mL | Freq: Once | INTRAVENOUS | Status: AC
  Administered 2019-06-17: 1000 mL via INTRAVENOUS

## 2019-06-17 NOTE — Telephone Encounter (Signed)
Pt states that she has been experiencing pain in her rib cage, similar to when she was dx with fatty liver. She just made an appt to see Tara Keller on 7/1 but would like some advise in the meantime.
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
## 2019-06-17 NOTE — Telephone Encounter (Signed)
Agree with ER eval.

## 2019-06-17 NOTE — Telephone Encounter (Signed)
The pt has a history of pancreatitis and called stating that she has right side abd pain that started several days ago. She was treating with tylenol without relief.  She says that today the pain is 10/10 and is so severe she can not stand up straight.  She was advised to go to the ED for eval of possible pancreatitis. The pt has been advised of the information and verbalized understanding.

## 2019-06-17 NOTE — ED Triage Notes (Signed)
Patient arrives to ED with complaints of sharp intermittent right upper abdominal pain for the last several days. Patient states that she has hx of pancreatitis and OTC meds are not helping. Patient denies N/V, SOB, and chest pain.

## 2019-06-18 MED ORDER — HYDROCODONE-ACETAMINOPHEN 5-325 MG PO TABS: 1.0000 | ORAL_TABLET | Freq: Four times a day (QID) | ORAL | 0 refills | Status: DC | PRN

## 2019-06-18 NOTE — Discharge Instructions (Signed)
1. Medications: You can take hydrocodone as needed for severe pain but do not drive, drink alcohol, or operate heavy machinery while taking this medicine as it can cause drowsiness.  Be aware this medicine contains Tylenol and do not exceed more than 4000 mg of Tylenol daily. 2. Treatment: rest, drink plenty of fluids, advance diet slowly.  Start with water and broth then advance to bland foods that will not upset your stomach such as crackers, mashed potatoes, and peanut butter.  I have attached information regarding dietary modifications for someone with pancreatitis 3. Follow Up: Please followup with your primary doctor or gastroenterologist for discussion of your diagnoses and further evaluation after today's visit; Please return to the ER for persistent vomiting, high fevers or worsening symptoms

## 2019-06-20 ENCOUNTER — Telehealth: Payer: Self-pay | Admitting: Gastroenterology

## 2019-06-20 NOTE — Telephone Encounter (Signed)
Patient is calling states she was given Hydrocodone at the ED 6 tablets till see follows up with GI but she does not have enough for the weekend requesting refill

## 2019-06-20 NOTE — Telephone Encounter (Signed)
Pt was seen in the ER. Pt has h/o pancreatitis. States she was given 6 hydrocodone pills and is requesting a refill to get through the weekend. Please advise.

## 2019-07-10 ENCOUNTER — Encounter: Payer: Self-pay | Admitting: Gastroenterology

## 2019-07-10 ENCOUNTER — Ambulatory Visit: Payer: Medicaid Other | Admitting: Gastroenterology

## 2019-07-10 VITALS — BP 118/80 | HR 102 | Ht 66.0 in | Wt 281.0 lb

## 2019-07-10 DIAGNOSIS — Z01818 Encounter for other preprocedural examination: Secondary | ICD-10-CM | POA: Diagnosis not present

## 2019-07-10 DIAGNOSIS — R1013 Epigastric pain: Secondary | ICD-10-CM

## 2019-07-10 DIAGNOSIS — R1011 Right upper quadrant pain: Secondary | ICD-10-CM | POA: Insufficient documentation

## 2019-07-10 DIAGNOSIS — R748 Abnormal levels of other serum enzymes: Secondary | ICD-10-CM | POA: Insufficient documentation

## 2019-07-10 MED ORDER — PANTOPRAZOLE SODIUM 40 MG PO TBEC: 40.0000 mg | DELAYED_RELEASE_TABLET | Freq: Two times a day (BID) | ORAL | 2 refills | Status: DC

## 2019-07-10 MED ORDER — SUCRALFATE 1 G PO TABS
1.0000 g | ORAL_TABLET | Freq: Three times a day (TID) | ORAL | 1 refills | Status: DC
Start: 2019-07-10 — End: 2019-11-05

## 2019-07-10 MED ORDER — TRAMADOL HCL 50 MG PO TABS: 50.0000 mg | ORAL_TABLET | Freq: Three times a day (TID) | ORAL | 0 refills | Status: DC | PRN
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
## 2019-07-10 NOTE — Patient Instructions (Addendum)
If you are age 56 or older, your body mass index should be between 23-30. Your Body mass index is 45.35 kg/m. If this is out of the aforementioned range listed, please consider follow up with your Primary Care Provider.  If you are age 23 or younger, your body mass index should be between 19-25. Your Body mass index is 45.35 kg/m. If this is out of the aformentioned range listed, please consider follow up with your Primary Care Provider.   You have been scheduled for an endoscopy. Please follow written instructions given to you at your visit today. If you use inhalers (even only as needed), please bring them with you on the day of your procedure.  START Carafate 1 gm 1 tablet before meals three times a day and before bed. INCREASE Pantoprazole 40 mg to 1 tablet twice a day. STOP Omeprazole. START Tramadol 50 mg 1 tablet every 8 hours as needed. All medications have been sent to your pharmacy.  You have been scheduled to follow up with Dr. Hilarie Fredrickson on September 10, 2019 at 10:10 am.

## 2019-07-17 ENCOUNTER — Ambulatory Visit (INDEPENDENT_AMBULATORY_CARE_PROVIDER_SITE_OTHER): Payer: Medicaid Other

## 2019-07-17 ENCOUNTER — Other Ambulatory Visit: Payer: Self-pay | Admitting: Internal Medicine

## 2019-07-17 ENCOUNTER — Other Ambulatory Visit: Payer: Self-pay

## 2019-07-17 DIAGNOSIS — Z1159 Encounter for screening for other viral diseases: Secondary | ICD-10-CM

## 2019-07-17 LAB — SARS CORONAVIRUS 2 (TAT 6-24 HRS): SARS Coronavirus 2: NEGATIVE

## 2019-07-21 ENCOUNTER — Other Ambulatory Visit: Payer: Self-pay

## 2019-07-21 ENCOUNTER — Encounter: Payer: Self-pay | Admitting: Internal Medicine

## 2019-07-21 ENCOUNTER — Ambulatory Visit (AMBULATORY_SURGERY_CENTER): Payer: Medicaid Other | Admitting: Internal Medicine

## 2019-07-21 ENCOUNTER — Encounter: Payer: Self-pay | Admitting: Dietician

## 2019-07-21 ENCOUNTER — Encounter: Payer: Medicaid Other | Attending: Gynecologic Oncology | Admitting: Dietician

## 2019-07-21 VITALS — BP 118/69 | HR 77 | Temp 96.9°F | Resp 20 | Ht 66.0 in | Wt 281.0 lb

## 2019-07-21 DIAGNOSIS — E1169 Type 2 diabetes mellitus with other specified complication: Secondary | ICD-10-CM

## 2019-07-21 DIAGNOSIS — R1013 Epigastric pain: Secondary | ICD-10-CM

## 2019-07-21 DIAGNOSIS — Z794 Long term (current) use of insulin: Secondary | ICD-10-CM | POA: Insufficient documentation

## 2019-07-21 DIAGNOSIS — R1011 Right upper quadrant pain: Secondary | ICD-10-CM

## 2019-07-21 DIAGNOSIS — K449 Diaphragmatic hernia without obstruction or gangrene: Secondary | ICD-10-CM

## 2019-07-21 DIAGNOSIS — K319 Disease of stomach and duodenum, unspecified: Secondary | ICD-10-CM

## 2019-07-21 DIAGNOSIS — K3189 Other diseases of stomach and duodenum: Secondary | ICD-10-CM

## 2019-07-21 DIAGNOSIS — K2270 Barrett's esophagus without dysplasia: Secondary | ICD-10-CM

## 2019-07-21 MED ORDER — SODIUM CHLORIDE 0.9 % IV SOLN
500.0000 mL | Freq: Once | INTRAVENOUS | Status: DC
Start: 2019-07-21 — End: 2019-07-21

## 2019-07-21 NOTE — Patient Instructions (Signed)
Handout provided on Hiatal Hernia.   YOU HAD AN ENDOSCOPIC PROCEDURE TODAY AT [facility]:   Refer to the procedure report that was given to you for any specific questions about what was found during the examination.  If the procedure report does not answer your questions, please call your gastroenterologist to clarify.  If you requested that your care partner not be given the details of your procedure findings, then the procedure report has been included in a sealed envelope for you to review at your convenience later.  YOU SHOULD EXPECT: Some feelings of bloating in the abdomen. Passage of more gas than usual.  Walking can help get rid of the air that was put into your GI tract during the procedure and reduce the bloating. If you had a lower endoscopy (such as a colonoscopy or flexible sigmoidoscopy) you may notice spotting of blood in your stool or on the toilet paper. If you underwent a bowel prep for your procedure, you may not have a normal bowel movement for a few days.  Please Note:  You might notice some irritation and congestion in your nose or some drainage.  This is from the oxygen used during your procedure.  There is no need for concern and it should clear up in a day or so.  SYMPTOMS TO REPORT IMMEDIATELY:   Following upper endoscopy (EGD)  Vomiting of blood or coffee ground material  New chest pain or pain under the shoulder blades  Painful or persistently difficult swallowing  New shortness of breath  Fever of 100F or higher  Black, tarry-looking stools  For urgent or emergent issues, a gastroenterologist can be reached at any hour by calling [phone]. Do not use MyChart messaging for urgent concerns.    DIET:  We do recommend a small meal at first, but then you may proceed to your regular diet.  Drink plenty of fluids but you should avoid alcoholic beverages for 24 hours.  ACTIVITY:  You should plan to take it easy for the rest of today and you  should NOT DRIVE or use heavy machinery until tomorrow (because of the sedation medicines used during the test).    FOLLOW UP: Our staff will call the number listed on your records 48-72 hours following your procedure to check on you and address any questions or concerns that you may have regarding the information given to you following your procedure. If we do not reach you, we will leave a message.  We will attempt to reach you two times.  During this call, we will ask if you have developed any symptoms of COVID 19. If you develop any symptoms (ie: fever, flu-like symptoms, shortness of breath, cough etc.) before then, please call [phone].  If you test positive for Covid 19 in the 2 weeks post procedure, please call and report this information to [name].    If any biopsies were taken you will be contacted by phone or by letter within the next 1-3 weeks.  Please call us at [phone] if you have not heard about the biopsies in 3 weeks.    SIGNATURES/CONFIDENTIALITY: You and/or your care partner have signed paperwork which will be entered into your electronic medical record.  These signatures attest to the fact that that the information above on your After Visit Summary has been reviewed and is understood.  Full responsibility of the confidentiality of this discharge information lies with you and/or your care-partner.

## 2019-07-21 NOTE — Op Note (Signed)
[facility] Patient Name: Tara Keller Procedure Date: 07/21/2019 10:27 AM MRN: [id number] Endoscopist: Jerene Bears , MD Age: 54 Referring MD:  Date of Birth: 12/05/1965 Gender: Female Account #: [id number] Procedure:                Upper GI endoscopy Indications:              Epigastric abdominal pain, Abdominal pain in the                            right upper quadrant, history of pancreatitis with                            persistent elevated lipase without ongoing                            pancreatitis by imaging, last EGD 2016 with reflux                            esophagitis and hiatal hernia Medicines:                Monitored Anesthesia Care Procedure:                Pre-Anesthesia Assessment:                           - Prior to the procedure, a History and Physical                            was performed, and patient medications and                            allergies were reviewed. The patient's tolerance of                            previous anesthesia was also reviewed. The risks                            and benefits of the procedure and the sedation                            options and risks were discussed with the patient.                            All questions were answered, and informed consent                            was obtained. Prior Anticoagulants: The patient has                            taken no previous anticoagulant or antiplatelet                            agents. ASA Grade Assessment: III - A patient with  severe systemic disease. After reviewing the risks                            and benefits, the patient was deemed in                            satisfactory condition to undergo the procedure.                           After obtaining informed consent, the endoscope was                            passed under direct vision. Throughout the                            procedure, the patient's blood  pressure, pulse, and                            oxygen saturations were monitored continuously. The                            Endoscope was introduced through the mouth, and                            advanced to the second part of duodenum. The upper                            GI endoscopy was accomplished without difficulty.                            The patient tolerated the procedure well. Scope In: Scope Out: Findings:                 The Z-line was irregular and was found 36 cm from                            the incisors. Biopsies were taken with a cold                            forceps for histology to exclude Barrett's                            esophagus. No evidence of esophagitis or esophageal                            varices.                           A 3 cm hiatal hernia was present.                           The entire examined stomach was normal. Biopsies                            were taken  with a cold forceps for histology and                            Helicobacter pylori testing.                           The examined duodenum was normal. Complications:            No immediate complications. Estimated Blood Loss:     Estimated blood loss was minimal. Impression:               - Z-line irregular, 36 cm from the incisors.                            Biopsied.                           - 3 cm hiatal hernia.                           - Normal stomach. Biopsied.                           - Normal examined duodenum. Recommendation:           - Patient has a contact number available for                            emergencies. The signs and symptoms of potential                            delayed complications were discussed with the                            patient. Return to normal activities tomorrow.                            Written discharge instructions were provided to the                            patient.                           - Resume previous  diet.                           - Continue present medications.                           - Await pathology results.                           - If persistent right upper quadrant and/or                            epigastric abdominal pain then MRI abd with  contrast + MRCP is recommended (normal GES in 2018,                            CT scan 04/2019 unremarkable for acute pathology                            from GI perspective, but pancreatitis by CT in                            10/2018). Jerene Bears, MD 07/21/2019 10:55:00 AM This report has been signed electronically.

## 2019-07-21 NOTE — Progress Notes (Signed)
Called to room to assist during endoscopic procedure.  Patient ID and intended procedure confirmed with present staff. Received instructions for my participation in the procedure from the performing physician.  

## 2019-07-21 NOTE — Progress Notes (Signed)
PT taken to PACU. Monitors in place. VSS. Report given to RN. 

## 2019-07-21 NOTE — Progress Notes (Addendum)
Diabetes Self-Management Education  Visit Type: First/Initial  Appt. Start Time: 1415 Appt. End Time: 1520  07/22/2019  [name], identified by name and date of birth, is a 54 y.o. female with a diagnosis of Diabetes: Type 2.   ASSESSMENT Patient is here today alone.  Patient would like to learn to eat healthier, learn some new recipes.  She states that she has been here before but did not have a good understanding about and struggled with drugs as well 10 years ago. She was also having a problem with food security at that time and states that this has since resolved.  She used a portion plate in the past, lost this and is planning on purchasing another one.  History includes Type 2 diabetes since 2008, endometrial cancer (in remission per patient), OSA but does not like C-pap, HTN, HLD, depression, vitamin D deficiency She smokes and has decreased from 2 packs to 4 cigarettes per day.  Medications include Lantus 42 units q am and pm, metformin ("but I want to quit as I read this is not good for my Liver or Kidneys"), phentermine, 2000 units vitamin D daily A1C 13% 04/2019 increased from 11.1% 01/20/2019  Weight hx: 280 lbs 07/21/2019 347 lbs 05/2014 400 lbs highest weight and lost due to drug abuse  Patient lives with her fiance.  She does the shopping and cooking. They avoid pork, chicken, and recently stopped chips and regular soda, tries to eat more vegetables, uses some salt. She is on disability.  Gets SNAP. Used to do [employer] for exercise.  Height 5\' 6"  (1.676 m), weight 280 lb (127 kg), last menstrual period 01/23/2014. Body mass index is 45.19 kg/m.   Diabetes Self-Management Education - 07/21/19 1439      Visit Information   Visit Type First/Initial      Initial Visit   Diabetes Type Type 2    Are you currently following a meal plan? No    Are you taking your medications as prescribed? Yes    Date Diagnosed 2008      Health Coping   How would you rate  your overall health? Fair      Psychosocial Assessment   Patient Belief/Attitude about Diabetes Afraid    Self-care barriers Lack of material resources    Self-management support Doctor's office;Friends    Other persons present Patient    Patient Concerns Nutrition/Meal planning;Glycemic Control;Weight Control    Special Needs None    Preferred Learning Style No preference indicated    Learning Readiness Ready    How often do you need to have someone help you when you read instructions, pamphlets, or other written materials from your doctor or pharmacy? 1 - Never    What is the last grade level you completed in school? 11th      Pre-Education Assessment   Patient understands the diabetes disease and treatment process. Needs Review    Patient understands incorporating nutritional management into lifestyle. Needs Review    Patient undertands incorporating physical activity into lifestyle. Needs Review    Patient understands using medications safely. Needs Review    Patient understands monitoring blood glucose, interpreting and using results Needs Review    Patient understands prevention, detection, and treatment of acute complications. Needs Review    Patient understands prevention, detection, and treatment of chronic complications. Needs Review    Patient understands how to develop strategies to address psychosocial issues. Needs Review    Patient understands how to develop strategies  to promote health/change behavior. Needs Review      Complications   Last HgB A1C per patient/outside source 13 %   04/22/2019  increased f4rom 11.1%  01/20/19   How often do you check your blood sugar? 3-4 times/day    Fasting Blood glucose range (mg/dL) >200   369 this am   Postprandial Blood glucose range (mg/dL) >200   210   Number of hypoglycemic episodes per month 0    Number of hyperglycemic episodes per week 14    Can you tell when your blood sugar is high? Yes    What do you do if your blood sugar  is high? take medication as prescribed, drinks water    Have you had a dilated eye exam in the past 12 months? Yes    Have you had a dental exam in the past 12 months? No    Are you checking your feet? Yes    How many days per week are you checking your feet? 3      Dietary Intake   Breakfast coffee with sugar free cream, bread occasionally with strawberry cream cheese, eggs, [place] bacon OR cereal (cornflakes- occasionally with sugar), banana, 2% milk, occasional bread    Snack (morning) none    Lunch hotdog or hamburger (sometimes no bun) or fish or shrimp, air fried fries    Snack (afternoon) none    Dinner [place] and cheese sandwich on [employer], occasional sun chips    Snack (evening) none    Beverage(s) water, sweet tea (medium tea 1 x/day)      Exercise   Exercise Type ADL's    How many days per week to you exercise? 0    How many minutes per day do you exercise? 0    Total minutes per week of exercise 0      Patient Education   Previous Diabetes Education Yes (please comment)   2016   Nutrition management  Role of diet in the treatment of diabetes and the relationship between the three main macronutrients and blood glucose level;Meal options for control of blood glucose level and chronic complications.;Food label reading, portion sizes and measuring food.    Physical activity and exercise  Role of exercise on diabetes management, blood pressure control and cardiac health.    Medications Reviewed patients medication for diabetes, action, purpose, timing of dose and side effects.;Taught/reviewed insulin injection, site rotation, insulin storage and needle disposal.    Monitoring Purpose and frequency of SMBG.;Identified appropriate SMBG and/or A1C goals.;Taught/discussed recording of test results and interpretation of SMBG.    Acute complications Taught treatment of hypoglycemia - the 15 rule.    Chronic complications Relationship between chronic complications and blood glucose control     Psychosocial adjustment Worked with patient to identify barriers to care and solutions;Identified and addressed patients feelings and concerns about diabetes    Personal strategies to promote health Review risk of smoking and offered smoking cessation      Individualized Goals (developed by patient)   Nutrition General guidelines for healthy choices and portions discussed    Physical Activity Exercise 3-5 times per week;30 minutes per day    Medications take my medication as prescribed    Monitoring  test my blood glucose as discussed    Reducing Risk increase portions of healthy fats    Health Coping discuss diabetes with (comment)   MD, RD, CDE     Post-Education Assessment   Patient understands the diabetes disease and treatment  process. Demonstrates understanding / competency    Patient understands incorporating nutritional management into lifestyle. Needs Review    Patient undertands incorporating physical activity into lifestyle. Needs Review    Patient understands using medications safely. Demonstrates understanding / competency    Patient understands monitoring blood glucose, interpreting and using results Demonstrates understanding / competency    Patient understands prevention, detection, and treatment of acute complications. Demonstrates understanding / competency    Patient understands prevention, detection, and treatment of chronic complications. Demonstrates understanding / competency    Patient understands how to develop strategies to address psychosocial issues. Needs Review    Patient understands how to develop strategies to promote health/change behavior. Needs Review      Outcomes   Expected Outcomes Demonstrated interest in learning. Expect positive outcomes    Future DMSE 4-6 wks    Program Status Not Completed           Individualized Plan for Diabetes Self-Management Training:   Learning Objective:  Patient will have a greater understanding of diabetes  self-management. Patient education plan is to attend individual and/or group sessions per assessed needs and concerns.   Plan:   Patient Instructions  Consider the resources provided to help increase your vegetable intake. Consider the Wii games.  Start 2 times per week and increase to 3 OR do a Zumba video  Drink water or other beverages with no carbs.  Try a new recipe or vegetable occasionally.  http://schroeder.biz/    Expected Outcomes:  Demonstrated interest in learning. Expect positive outcomes  Education material provided: Meal plan card and Snack sheet; ADA Book for type 2 diabetes with My plate, food resource page with farmer's markets that double SNAP or give an extra food box to those with SNAP.  If problems or questions, patient to contact team via:  Phone  Future DSME appointment: 4-6 wks

## 2019-07-21 NOTE — Progress Notes (Signed)
Pt's states no medical or surgical changes since previsit or office visit.  CW - vitals 

## 2019-07-21 NOTE — Progress Notes (Signed)
Addendum: Reviewed and agree with assessment and management plan. Phoua Hoadley M, MD  

## 2019-07-21 NOTE — Patient Instructions (Addendum)
Consider the resources provided to help increase your vegetable intake. Consider the Wii games.  Start 2 times per week and increase to 3 OR do a Zumba video  Drink water or other beverages with no carbs.  Try a new recipe or vegetable occasionally.  DiabetesFoodHub.org

## 2019-07-23 ENCOUNTER — Telehealth: Payer: Self-pay

## 2019-07-23 NOTE — Telephone Encounter (Signed)
  Follow up Call-  Call back number 07/21/2019 09/02/2018  Post procedure Call Back phone  # [phone]  Permission to leave phone message Yes Yes  Some recent data might be hidden     Patient questions:  Do you have a fever, pain , or abdominal swelling? No. Pain Score  0 *  Have you tolerated food without any problems? Yes.    Have you been able to return to your normal activities? Yes.    Do you have any questions about your discharge instructions: Diet   No. Medications  No. Follow up visit  No.  Do you have questions or concerns about your Care? No.  Actions: * If pain score is 4 or above: No action needed, pain <4.  1. Have you developed a fever since your procedure? no  2.   Have you had an respiratory symptoms (SOB or cough) since your procedure? no  3.   Have you tested positive for COVID 19 since your procedure no  4.   Have you had any family members/close contacts diagnosed with the COVID 19 since your procedure?  no   If yes to any of these questions please route to Joylene John, RN and Erenest Rasher, RN

## 2019-07-24 ENCOUNTER — Encounter: Payer: Self-pay | Admitting: Internal Medicine

## 2019-07-24 DIAGNOSIS — K2270 Barrett's esophagus without dysplasia: Secondary | ICD-10-CM

## 2019-08-19 ENCOUNTER — Ambulatory Visit: Payer: Medicaid Other | Admitting: Dietician

## 2019-08-20 ENCOUNTER — Encounter: Payer: Self-pay | Admitting: *Deleted

## 2019-09-03 ENCOUNTER — Ambulatory Visit (INDEPENDENT_AMBULATORY_CARE_PROVIDER_SITE_OTHER): Payer: Medicaid Other | Admitting: Podiatry

## 2019-09-03 ENCOUNTER — Other Ambulatory Visit: Payer: Self-pay

## 2019-09-03 ENCOUNTER — Encounter: Payer: Self-pay | Admitting: Podiatry

## 2019-09-03 DIAGNOSIS — B351 Tinea unguium: Secondary | ICD-10-CM | POA: Diagnosis not present

## 2019-09-03 DIAGNOSIS — E1149 Type 2 diabetes mellitus with other diabetic neurological complication: Secondary | ICD-10-CM

## 2019-09-03 DIAGNOSIS — M79675 Pain in left toe(s): Secondary | ICD-10-CM

## 2019-09-03 DIAGNOSIS — M79674 Pain in right toe(s): Secondary | ICD-10-CM

## 2019-09-03 NOTE — Progress Notes (Signed)
Complaint:  Visit Type: Patient returns to my office for continued preventative foot care services. Complaint: Patient states" my nails have grown long and thick and become painful to walk and wear shoes" Patient has been diagnosed with DM with neuropathy. The patient presents for preventative foot care services.  Podiatric Exam: Vascular: dorsalis pedis and posterior tibial pulses are palpable bilateral. Capillary return is immediate. Temperature gradient is WNL. Skin turgor WNL  Sensorium: Diminished  Semmes Weinstein monofilament test. Normal tactile sensation bilaterally. Nail Exam: Pt has thick disfigured discolored nails with subungual debris noted bilateral entire nail hallux through fifth toenails Ulcer Exam: There is no evidence of ulcer or pre-ulcerative changes or infection. Orthopedic Exam: Muscle tone and strength are WNL. No limitations in general ROM. No crepitus or effusions noted. Foot type and digits show no abnormalities. Bony prominences are unremarkable. Skin: No Porokeratosis. No infection or ulcers  Diagnosis:  Onychomycosis, , Pain in right toe, pain in left toes  Treatment & Plan Procedures and Treatment: Consent by patient was obtained for treatment procedures.   Debridement of mycotic and hypertrophic toenails, 1 through 5 bilateral and clearing of subungual debris. No ulceration, no infection noted.  Return Visit-Office Procedure: Patient instructed to return to the office for a follow up visit 3 months for continued evaluation and treatment.    Kla Bily DPM 

## 2019-09-10 ENCOUNTER — Encounter: Payer: Self-pay | Admitting: Internal Medicine

## 2019-09-10 ENCOUNTER — Ambulatory Visit: Payer: Medicaid Other | Admitting: Internal Medicine

## 2019-09-10 VITALS — BP 128/84 | HR 81 | Ht 66.0 in | Wt 287.8 lb

## 2019-09-10 DIAGNOSIS — K219 Gastro-esophageal reflux disease without esophagitis: Secondary | ICD-10-CM | POA: Diagnosis not present

## 2019-09-10 DIAGNOSIS — K2270 Barrett's esophagus without dysplasia: Secondary | ICD-10-CM | POA: Diagnosis not present

## 2019-09-10 DIAGNOSIS — K5900 Constipation, unspecified: Secondary | ICD-10-CM

## 2019-09-10 MED ORDER — PANTOPRAZOLE SODIUM 40 MG PO TBEC: 40.0000 mg | DELAYED_RELEASE_TABLET | Freq: Two times a day (BID) | ORAL | 5 refills | Status: DC

## 2019-09-10 MED ORDER — POLYETHYLENE GLYCOL 3350 17 GM/SCOOP PO POWD: 17.0000 g | Freq: Every day | ORAL | 3 refills | Status: DC | PRN

## 2019-09-10 NOTE — Progress Notes (Signed)
   Subjective:    Patient ID: Tara Keller, female    DOB: 03/04/1965, 54 y.o.   MRN: [id number]  HPI Tara Keller is a 54 year old female with a past medical history of GERD with history of esophagitis, new diagnosis of Barrett's esophagus, history of pancreatitis, history of fatty liver, history of colon polyps, probable intermittent diabetic gastroparesis, diabetes with polyneuropathy, hypertension, hyperlipidemia sleep apnea who is here for follow-up.  She is here alone today.  She came for upper endoscopy on 07/20/2019 to evaluate epigastric abdominal pain along with right upper quadrant abdominal pain. EGD showed irregular Z-line which was biopsied.  There was no evidence of esophagitis or varices.  There was a 3 cm hiatal hernia.  The stomach and examined duodenum were normal.  Biopsies from the esophagus showed intestinal metaplasia without dysplasia.  Gastric biopsies were reactive without H. Pylori.  Her PPI was doubled from pantoprazole 40 mg once to twice daily.  She is not using Carafate.  Her abdominal pain and reflux has resolved entirely.  She reports she is feeling very well.  No dysphagia or odynophagia.  No nausea or vomiting.  Good appetite.  Bowel movements have been somewhat slower lately as usually she will have a bowel movement every 1 to 2 days.  Most recently has been about every 3 days.  Stools are not overly hard and she has had no blood in her stool or melena.  When she does not have bowel movements every 1 or 2 days she does feel abdominal bloating but not pain.   Review of Systems As per HPI, otherwise negative  Current Medications, Allergies, Past Medical History, Past Surgical History, Family History and Social History were reviewed in [employer] record.     Objective:   Physical Exam BP 128/84 (BP Location: Left Arm, Patient Position: Sitting)   Pulse 81   Ht 5\' 6"  (1.676 m)   Wt 287 lb 12.8 oz (130.5 kg)   LMP 01/23/2014 (Approximate)  Comment: neg PREG TEST 07/18/18  SpO2 98%   BMI 46.45 kg/m  Gen: awake, alert, NAD Neuro: nonfocal       Assessment & Plan:  54 year old female with a past medical history of GERD with history of esophagitis, new diagnosis of Barrett's esophagus, history of pancreatitis, history of fatty liver, history of colon polyps, probable intermittent diabetic gastroparesis, diabetes with polyneuropathy, hypertension, hyperlipidemia sleep apnea who is here for follow-up.   1.  GERD with Barrett's esophagus --dyspeptic and reflux symptoms have improved with increased PPI. --Continue pantoprazole 40 mg twice daily AC  2.  Mild constipation --begin MiraLAX 17 g on a daily basis as needed  3.  Personal history of colon polyps --her last screening/surveillance colonoscopy was in August 2020; adenomatous polyps were removed.  Recommended repeat August 2023  4.  History of fatty liver --she does need to continue risk factor modification including control of hypertension, hyperlipidemia, diabetes.  Low-fat high-fiber diet recommended in an attempt to lose weight which will improve fatty liver.  No evidence for advanced liver disease or cirrhosis.  Last cross-sectional imaging of the liver was April 2021.  She is status post cholecystectomy

## 2019-09-10 NOTE — Patient Instructions (Signed)
We have sent the following medications to your pharmacy for you to pick up at your convenience: Pantoprazole    Continue your miralax 17 Gm daily as needed for constipation Continue Pantoprazole 40 mg twice a day  If you are age 56 or older, your body mass index should be between 23-30. Your Body mass index is 46.45 kg/m. If this is out of the aforementioned range listed, please consider follow up with your Primary Care Provider.  If you are age 30 or younger, your body mass index should be between 19-25. Your Body mass index is 46.45 kg/m. If this is out of the aformentioned range listed, please consider follow up with your Primary Care Provider.   Due to recent changes in healthcare laws, you may see the results of your imaging and laboratory studies on MyChart before your provider has had a chance to review them.  We understand that in some cases there may be results that are confusing or concerning to you. Not all laboratory results come back in the same time frame and the provider may be waiting for multiple results in order to interpret others.  Please give [name] 48 hours in order for your provider to thoroughly review all the results before contacting the office for clarification of your results.

## 2019-09-17 ENCOUNTER — Ambulatory Visit: Payer: Medicaid Other | Admitting: Internal Medicine

## 2019-09-19 ENCOUNTER — Ambulatory Visit: Payer: Medicaid Other | Admitting: Internal Medicine

## 2019-09-23 ENCOUNTER — Encounter: Payer: Self-pay | Admitting: Dietician

## 2019-09-23 ENCOUNTER — Other Ambulatory Visit: Payer: Self-pay

## 2019-09-23 ENCOUNTER — Encounter: Payer: Medicaid Other | Attending: Gynecologic Oncology | Admitting: Dietician

## 2019-09-23 DIAGNOSIS — Z794 Long term (current) use of insulin: Secondary | ICD-10-CM | POA: Insufficient documentation

## 2019-09-23 DIAGNOSIS — E1169 Type 2 diabetes mellitus with other specified complication: Secondary | ICD-10-CM | POA: Diagnosis not present

## 2019-09-23 NOTE — Progress Notes (Signed)
Diabetes Self-Management Education  Visit Type: Follow-up  Appt. Start Time: 1630 Appt. End Time: 4431  09/23/2019  [name], identified by name and date of birth, is a 54 y.o. female with a diagnosis of Diabetes: Type 2.   ASSESSMENT Patient is here today alone.  She was last seen 07/21/2019. She states that she has had increased stress recently including another death of friend to [place] and she feels lost and alone.  Her daughter is currently at the hospital. She has not been able to quit smoking due to increased stress. She states that she does not really like vegetables.  Her mother was an alcoholic and Tara Keller had responsibility of cooking for her siblings and did not grow up eating vegetables. She states that she has decreased her sugar intake. She states that she is going to see a new doctor as she went yesterday and waited for an hour and left prior to being seen. Patient reports pancreatitis and resulting pain and would like to have more information regarding diet for this.  History includes Type 2 diabetes since 2008, endometrial cancer (in remission per patient), OSA but does not like C-pap, HTN, HLD, depression, vitamin D deficiency, pancreatitis, fatty liver She smokes and has decreased from 2 packs to 4 cigarettes per day.  Medications include Lantus 42 units q am and pm, metformin ("but I want to quit as I read this is not good for my Liver or Kidneys"), phentermine, 2000 units vitamin D daily A1C 13% 04/2019 increased from 11.1% 01/20/2019  Weight hx: 286 lbs 09/23/2019 280 lbs 07/21/2019 347 lbs 05/2014 400 lbs highest weight and lost due to drug abuse  Body Composition Scale Sept. 14, 2021  Current Body Weight 286 lbs  Total Body Fat % 48.3  Visceral Fat 20  Fat-Free Mass % 51.6   Total Body Water % 40.3  Muscle-Mass lbs 32.1  BMI 46.2  Body Fat Displacement          Torso  lbs 85.9         Left Leg  lbs 17.1         Right Leg  lbs 17.1         Left Arm   lbs 8.5         Right Arm   lbs 8.5    Patient lives with her fiance.  He is currently taking chemo.  She does the shopping and cooking. She feels isolated since covid. They avoid pork, chicken, and recently stopped chips and regular soda, uses some salt.  She reported drug use and food security issues 10 years ago. She is on disability.  Gets SNAP. Used to do [employer] for exercise. Fiance is taking chemo.  Weight 286 lb (129.7 kg), last menstrual period 01/23/2014. Body mass index is 46.16 kg/m.   Diabetes Self-Management Education - 09/23/19 1810      Visit Information   Visit Type Follow-up      Initial Visit   Diabetes Type Type 2    Are you taking your medications as prescribed? Yes      Psychosocial Assessment   Patient Belief/Attitude about Diabetes Defeat/Burnout    Self-care barriers Lack of material resources    Self-management support Doctor's office    Other persons present Patient    Patient Concerns Nutrition/Meal planning;Glycemic Control;Weight Control    Special Needs None    Preferred Learning Style No preference indicated    Learning Readiness Ready  Pre-Education Assessment   Patient understands the diabetes disease and treatment process. Demonstrates understanding / competency    Patient understands incorporating nutritional management into lifestyle. Needs Review    Patient undertands incorporating physical activity into lifestyle. Needs Review    Patient understands using medications safely. Demonstrates understanding / competency    Patient understands monitoring blood glucose, interpreting and using results Demonstrates understanding / competency    Patient understands prevention, detection, and treatment of acute complications. Demonstrates understanding / competency    Patient understands prevention, detection, and treatment of chronic complications. Demonstrates understanding / competency    Patient understands how to develop strategies to  address psychosocial issues. Needs Review    Patient understands how to develop strategies to promote health/change behavior. Needs Review      Complications   How often do you check your blood sugar? 3-4 times / week    Fasting Blood glucose range (mg/dL) >200   210   Number of hypoglycemic episodes per month 0      Dietary Intake   Breakfast 1 slice [place] bacon, [employer] toast with strawberry cream cheese, coffee    Lunch fried chicken or salad without meat    Dinner hot Emergency planning/management officer) water, crystal lite, coffee with cream      Patient Education   Previous Diabetes Education Yes (please comment)   07/2019   Nutrition management  Meal options for control of blood glucose level and chronic complications.    Psychosocial adjustment Worked with patient to identify barriers to care and solutions;Role of stress on diabetes    Personal strategies to promote health Review risk of smoking and offered smoking cessation      Individualized Goals (developed by patient)   Nutrition General guidelines for healthy choices and portions discussed    Physical Activity Exercise 3-5 times per week;30 minutes per day    Medications take my medication as prescribed    Monitoring  test my blood glucose as discussed    Reducing Risk Other (comment)   choose healthy fats in small amounts, increase fiber   Health Coping discuss diabetes with (comment)   MD, RD, CDCES     Patient Self-Evaluation of Goals - Patient rates self as meeting previously set goals (% of time)   Nutrition 25 - 50%    Medications >75%    Monitoring 50 - 75 %    Problem Solving 50 - 75 %    Reducing Risk 50 - 75 %    Health Coping 50 - 75 %      Post-Education Assessment   Patient understands the diabetes disease and treatment process. Demonstrates understanding / competency    Patient understands incorporating nutritional management into lifestyle. Needs Review    Patient undertands incorporating physical activity into lifestyle.  Needs Review    Patient understands using medications safely. Demonstrates understanding / competency    Patient understands monitoring blood glucose, interpreting and using results Demonstrates understanding / competency    Patient understands prevention, detection, and treatment of acute complications. Demonstrates understanding / competency    Patient understands prevention, detection, and treatment of chronic complications. Demonstrates understanding / competency    Patient understands how to develop strategies to address psychosocial issues. Needs Review    Patient understands how to develop strategies to promote health/change behavior. Needs Review      Outcomes   Expected Outcomes Demonstrated interest in learning. Expect positive outcomes    Future DMSE 4-6 wks  Program Status Not Completed      Subsequent Visit   Since your last visit have you continued or begun to take your medications as prescribed? Yes    Since your last visit have you experienced any weight changes? Gain    Weight Gain (lbs) 6    Since your last visit, are you checking your blood glucose at least once a day? No           Individualized Plan for Diabetes Self-Management Training:   Learning Objective:  Patient will have a greater understanding of diabetes self-management. Patient education plan is to attend individual and/or group sessions per assessed needs and concerns.   Plan:   Patient Instructions  [facility] [phone]  Follow a low fat diet.  Avoid fried food.  Eat more Non-Starchy Vegetables . These include greens, broccoli, cauliflower, cabbage, carrots, beets, eggplant, peppers, squash and others. Minimize added sugars and refined grains . Rethink what you drink.  Choose beverages without added sugar.  Look for 0 carbs on the label. . See the list of whole grains below.  Find alternatives to usual sweet treats. Choose whole foods over processed. Make  simple meals at home more often than eating out.  Tips to increase fiber in your diet: (All plants have fiber.  Eat a variety. There are more than are on this list.) Slowly increase the amount of fiber you eat to 25-35 grams per day.  (More is fine if you tolerate it.) . Fiber from whole grains, nuts and seeds o Quinoa, 1/2 cup = 5 grams o Bulgur, 1/2 cup = 4.1 grams o Popcorn, 3 cups = 3.6 grams o Whole Wheat Spaghetti, 1/2 cup = 3.2 grams o Barley, 1/2 cup = 3 grams o Oatmeal, 1/2 cup = 2 grams o Whole Wheat English Muffin = 3 grams o Corn, 1/2 cup = 2.1 grams o Brown Rice, 1/2 cup = 1.8 grams o Flax seeds, 1 Tablespoon = 2.8 grams o Chia seeds, 1 Tablespoon = 11 grams o Almonds, 1 ounce = 3.5 grams fiber . Fiber from legumes o Kidney beans, 1/2 cup 7.9 grams o Lentils, 1/2 cup = 7.8 grams o Pinto beans, 1/2 cup = 7.7 grams o Black beans, 1/2 cup = 7.6 grams o Lima beans, 1/2 cup 6.4 grams o Chick peas, 1/2 cup = 5.3 grams o Black eyed peas, 1/2 cup = 4 grams . Fiber from fruits and vegetables o Pear, 6 grams o Apple. 3.3 grams o Raspberries or Blackberries, 3/4 cup = 6 grams o Strawberries or Blueberries, 1 cup = 3.4 grams o Baked sweet potato 3.8 grams fiber o Baked potato with skin 4.4 grams  o Peas, 1/2 cup = 4.4 grams  o Spinach, 1/2 cup cooked = 3.5 grams  o Avocado, 1/2 = 5 grams          Expected Outcomes:  Demonstrated interest in learning. Expect positive outcomes  Education material provided: My Plate placemat, Pancreatitis nutrition therapy  If problems or questions, patient to contact team via:  Phone  Future DSME appointment: 4-6 wks

## 2019-09-23 NOTE — Patient Instructions (Addendum)
[facility] [phone]  Follow a low fat diet.  Avoid fried food.  Eat more Non-Starchy Vegetables . These include greens, broccoli, cauliflower, cabbage, carrots, beets, eggplant, peppers, squash and others. Minimize added sugars and refined grains . Rethink what you drink.  Choose beverages without added sugar.  Look for 0 carbs on the label. . See the list of whole grains below.  Find alternatives to usual sweet treats. Choose whole foods over processed. Make simple meals at home more often than eating out.  Tips to increase fiber in your diet: (All plants have fiber.  Eat a variety. There are more than are on this list.) Slowly increase the amount of fiber you eat to 25-35 grams per day.  (More is fine if you tolerate it.) . Fiber from whole grains, nuts and seeds o Quinoa, 1/2 cup = 5 grams o Bulgur, 1/2 cup = 4.1 grams o Popcorn, 3 cups = 3.6 grams o Whole Wheat Spaghetti, 1/2 cup = 3.2 grams o Barley, 1/2 cup = 3 grams o Oatmeal, 1/2 cup = 2 grams o Whole Wheat English Muffin = 3 grams o Corn, 1/2 cup = 2.1 grams o Brown Rice, 1/2 cup = 1.8 grams o Flax seeds, 1 Tablespoon = 2.8 grams o Chia seeds, 1 Tablespoon = 11 grams o Almonds, 1 ounce = 3.5 grams fiber . Fiber from legumes o Kidney beans, 1/2 cup 7.9 grams o Lentils, 1/2 cup = 7.8 grams o Pinto beans, 1/2 cup = 7.7 grams o Black beans, 1/2 cup = 7.6 grams o Lima beans, 1/2 cup 6.4 grams o Chick peas, 1/2 cup = 5.3 grams o Black eyed peas, 1/2 cup = 4 grams . Fiber from fruits and vegetables o Pear, 6 grams o Apple. 3.3 grams o Raspberries or Blackberries, 3/4 cup = 6 grams o Strawberries or Blueberries, 1 cup = 3.4 grams o Baked sweet potato 3.8 grams fiber o Baked potato with skin 4.4 grams  o Peas, 1/2 cup = 4.4 grams  o Spinach, 1/2 cup cooked = 3.5 grams  o Avocado, 1/2 = 5 grams

## 2019-09-29 ENCOUNTER — Other Ambulatory Visit: Payer: Self-pay

## 2019-09-29 ENCOUNTER — Ambulatory Visit (HOSPITAL_COMMUNITY)
Admission: EM | Admit: 2019-09-29 | Discharge: 2019-09-29 | Disposition: A | Discharge status: Home / Self Care | Payer: Medicaid Other | Attending: Emergency Medicine | Admitting: Emergency Medicine

## 2019-09-29 ENCOUNTER — Encounter (HOSPITAL_COMMUNITY): Payer: Self-pay | Admitting: Emergency Medicine

## 2019-09-29 DIAGNOSIS — B3731 Acute candidiasis of vulva and vagina: Secondary | ICD-10-CM

## 2019-09-29 DIAGNOSIS — B373 Candidiasis of vulva and vagina: Secondary | ICD-10-CM | POA: Diagnosis not present

## 2019-09-29 DIAGNOSIS — Z760 Encounter for issue of repeat prescription: Secondary | ICD-10-CM | POA: Diagnosis not present

## 2019-09-29 MED ORDER — FLUCONAZOLE 150 MG PO TABS: 150.0000 mg | ORAL_TABLET | ORAL | 0 refills | Status: AC

## 2019-09-29 MED ORDER — METFORMIN HCL ER 750 MG PO TB24: 750.0000 mg | ORAL_TABLET | Freq: Every day | ORAL | 0 refills | Status: DC
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
## 2019-09-29 NOTE — Discharge Instructions (Addendum)
Take Diflucan once a week for the next 3 weeks. Take Metformin as directed by your primary care provider.

## 2019-09-29 NOTE — ED Triage Notes (Signed)
PT reports vaginal yeast infection. She is an uncontrolled diabetic. Started 1 week ago.

## 2019-10-08 ENCOUNTER — Ambulatory Visit (INDEPENDENT_AMBULATORY_CARE_PROVIDER_SITE_OTHER): Payer: Medicaid Other | Admitting: Internal Medicine

## 2019-10-08 ENCOUNTER — Other Ambulatory Visit: Payer: Self-pay

## 2019-10-08 ENCOUNTER — Encounter: Payer: Self-pay | Admitting: Internal Medicine

## 2019-10-08 VITALS — BP 138/76 | HR 84 | Ht 66.0 in | Wt 280.4 lb

## 2019-10-08 DIAGNOSIS — E1142 Type 2 diabetes mellitus with diabetic polyneuropathy: Secondary | ICD-10-CM

## 2019-10-08 DIAGNOSIS — E1169 Type 2 diabetes mellitus with other specified complication: Secondary | ICD-10-CM | POA: Diagnosis not present

## 2019-10-08 DIAGNOSIS — E785 Hyperlipidemia, unspecified: Secondary | ICD-10-CM | POA: Diagnosis not present

## 2019-10-08 DIAGNOSIS — Z794 Long term (current) use of insulin: Secondary | ICD-10-CM | POA: Diagnosis not present

## 2019-10-08 DIAGNOSIS — E1165 Type 2 diabetes mellitus with hyperglycemia: Secondary | ICD-10-CM

## 2019-10-08 LAB — POCT GLYCOSYLATED HEMOGLOBIN (HGB A1C): Hemoglobin A1C: 11.6 % — AB (ref 4.0–5.6)

## 2019-10-08 LAB — POCT GLUCOSE (DEVICE FOR HOME USE): POC Glucose: 379 mg/dl — AB (ref 70–99)

## 2019-10-08 MED ORDER — LANTUS SOLOSTAR 100 UNIT/ML ~~LOC~~ SOPN
50.0000 [IU] | PEN_INJECTOR | Freq: Every day | SUBCUTANEOUS | 11 refills | Status: DC
Start: 2019-10-08 — End: 2019-10-24

## 2019-10-08 MED ORDER — BD PEN NEEDLE MINI U/F 31G X 5 MM MISC
1.0000 | 11 refills | Status: DC
Start: 2019-10-08 — End: 2019-12-11

## 2019-10-08 MED ORDER — NOVOLOG FLEXPEN 100 UNIT/ML ~~LOC~~ SOPN: 12.0000 [IU] | PEN_INJECTOR | Freq: Three times a day (TID) | SUBCUTANEOUS | 11 refills | Status: DC

## 2019-10-08 MED ORDER — METFORMIN HCL ER 750 MG PO TB24
750.0000 mg | ORAL_TABLET | Freq: Two times a day (BID) | ORAL | 3 refills | Status: DC
Start: 2019-10-08 — End: 2020-05-19
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
## 2019-10-08 NOTE — Patient Instructions (Addendum)
-   Decrease Metformin 750 mg to 1 tablet TWICE a day  - Decrease Lantus to 50 units ONCE a day  - Start Novolog  12 units with each meal    Bring meter on next visit     HOW TO TREAT LOW BLOOD SUGARS (Blood sugar LESS THAN 70 MG/DL)  Please follow the RULE OF 15 for the treatment of hypoglycemia treatment (when your (blood sugars are less than 70 mg/dL)    STEP 1: Take 15 grams of carbohydrates when your blood sugar is low, which includes:   3-4 GLUCOSE TABS  OR  3-4 OZ OF JUICE OR REGULAR SODA OR  ONE TUBE OF GLUCOSE GEL     STEP 2: RECHECK blood sugar in 15 MINUTES STEP 3: If your blood sugar is still low at the 15 minute recheck --> then, go back to STEP 1 and treat AGAIN with another 15 grams of carbohydrates.

## 2019-10-14 ENCOUNTER — Ambulatory Visit: Payer: Medicaid Other | Admitting: Internal Medicine

## 2019-10-14 ENCOUNTER — Ambulatory Visit (HOSPITAL_COMMUNITY)
Admission: RE | Admit: 2019-10-14 | Discharge: 2019-10-14 | Disposition: A | Discharge status: Home / Self Care | Payer: Medicaid Other | Source: Ambulatory Visit | Attending: Student in an Organized Health Care Education/Training Program | Admitting: Student in an Organized Health Care Education/Training Program

## 2019-10-14 ENCOUNTER — Other Ambulatory Visit: Payer: Self-pay

## 2019-10-14 ENCOUNTER — Encounter: Payer: Self-pay | Admitting: Internal Medicine

## 2019-10-14 VITALS — BP 142/77 | HR 81 | Temp 98.4°F | Ht 66.0 in | Wt 283.1 lb

## 2019-10-14 DIAGNOSIS — Y939 Activity, unspecified: Secondary | ICD-10-CM | POA: Insufficient documentation

## 2019-10-14 DIAGNOSIS — W19XXXA Unspecified fall, initial encounter: Secondary | ICD-10-CM | POA: Diagnosis not present

## 2019-10-14 DIAGNOSIS — Y929 Unspecified place or not applicable: Secondary | ICD-10-CM | POA: Insufficient documentation

## 2019-10-14 DIAGNOSIS — M79652 Pain in left thigh: Secondary | ICD-10-CM | POA: Diagnosis not present

## 2019-10-14 DIAGNOSIS — M25552 Pain in left hip: Secondary | ICD-10-CM | POA: Insufficient documentation

## 2019-10-14 NOTE — Patient Instructions (Signed)
Thank you, Ms.Latressa M Loss for allowing [name] to provide your care today. Today we discussed recent fall.    I have ordered the following labs for you:  Lab Orders  No laboratory test(s) ordered today     I will call if any are abnormal. All of your labs can be accessed through "My Chart".  I have place a referrals to none.  I have ordered the following tests: Xray of hip   I have ordered the following medication/changed the following medications:  1. Advil and tylenol for pain  Please follow-up with 1 weeks with all of your medications  Should you have any questions or concerns please call the internal medicine clinic at [phone].    Marianna Payment, D.O. [facility]   My Chart Access: [email or web address]   If you have not already done so, please get your COVID 19 vaccine  To schedule an appointment for a COVID vaccine choice any of the following: Go to [email or web address]   Go to [email or web address]                  Call [phone]                                     Call [phone] and select Option 2
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
## 2019-10-16 ENCOUNTER — Encounter: Payer: Self-pay | Admitting: Internal Medicine

## 2019-10-16 DIAGNOSIS — W19XXXA Unspecified fall, initial encounter: Secondary | ICD-10-CM | POA: Insufficient documentation

## 2019-10-16 NOTE — Assessment & Plan Note (Addendum)
Presents after having a fall this past Saturday (10/02).  Patient states that she was getting up to go answer the door for her son.  Subsequently she fell on her left side.  Patient denies any inciting events or symptoms.  She denied any chest pain, shortness of breath, palpitations, weakness, dizziness, or diaphoresis at the time of her fall. She states that she did not lose consciousness nor did she hit her head.  She does have some remaining lateral hip and thigh pain without weakness, paresthesias or numbness.  Although she states that she normally walks with a cane, she feels as though the pain is limiting her activity such as ambulating and standing for long periods of time.  As result she has been laying in bed more frequently with a heating pad on the associated area.  She takes Tylenol and Advil for the pain but states that she has remaining achy pain and stiffness particularly with standing for long periods of time.    On exam today, the patient is alert and oriented with 5/5 strength in bilateral upper and lower extremities, 2+ reflexes at brachioradialis, patellar, and Achilles tendon bilaterally.  2+ pulses in upper and lower extremities with intact sensation bilaterally.  She continues to have pain from the left hip radiating to the mid thigh that is tender to palpation.  No obvious masses or lesions.  She has full active range of motion but does walk with a limp on that left lower extremity.  Her balance appears to be at baseline.   Patient has many risk factors for cardiovascular disease but does not appear to be having any symptoms concerning for cardiogenic or structural heart disease as the etiology for her fall.  She has significant polypharmacy with multiple centrally acting medications on her med list.  Many of these medication she is unfamiliar with or cannot remember off the top of her head, which could have contributed to her fall.  Because the patient expresses significant pain in her  left hip, I will get a hip x-ray today and set her up for a follow-up appointment within a week to reevaluate her medications and discontinue medications as needed.  We will also get medical records from [facility] today.  Plan: -Hip x-ray -Follow-up appointment in 1 week -Record release signed for [facility]   **Addendum**  Hip XR results:  Slight symmetric narrowing of each hip joint. No fracture or dislocation. Intrauterine device overlying right mid sacrum.

## 2019-10-16 NOTE — Progress Notes (Signed)
Internal Medicine Clinic Attending  Case discussed with Dr. Coe  At the time of the visit.  We reviewed the resident's history and exam and pertinent patient test results.  I agree with the assessment, diagnosis, and plan of care documented in the resident's note.  

## 2019-10-21 ENCOUNTER — Other Ambulatory Visit: Payer: Self-pay

## 2019-10-21 ENCOUNTER — Encounter: Payer: Self-pay | Admitting: Dietician

## 2019-10-21 ENCOUNTER — Encounter: Payer: Medicaid Other | Attending: Gynecologic Oncology | Admitting: Dietician

## 2019-10-21 DIAGNOSIS — Z794 Long term (current) use of insulin: Secondary | ICD-10-CM | POA: Diagnosis present

## 2019-10-21 DIAGNOSIS — E1169 Type 2 diabetes mellitus with other specified complication: Secondary | ICD-10-CM | POA: Diagnosis not present

## 2019-10-21 NOTE — Patient Instructions (Signed)
Take your Novolog 15 minutes before each meal. Continue to choose foods that are baked more often than fried.   Continue to drink water and other unsweetened (zero carbohydrate containing) soda. Continue label reading. Be sure to eat carbohydrate with each meal. 1/2 your plate should be non starchy vegetables Small portion of protein with each meal Continue to check your blood sugar

## 2019-10-21 NOTE — Progress Notes (Signed)
Diabetes Self-Management Education  Visit Type: Follow-up  Appt. Start Time: 1640 Appt. End Time: 3382  10/21/2019  Tara Keller, identified by name and date of birth, is a 54 y.o. female with a diagnosis of Diabetes: Type 2.   ASSESSMENT Patient is here today alone.  She was last seen by myself 09/23/2019. She was seen by Dr. Kelton Pillar 10/08/2019.  Weight was 280 lbs (decreased from 286 lbs 09/23/2019). Metformin was decreased to 750 mg (1 tab) bid Lantus was decreased to 50 units q HS Novolog 12 units before each meal was started Patient was able to verbalize these changes and stated that she is consistent with this. She states that her blood sugar has improved.   Her meter was reviewed.  Readings 125-250 depending on the time of day and often around 150 fasting. Blood sugar was 138 in the office today 4 hours after lunch. She states that she has stopped drinking beverages with carbohydrates. She states that she is trying to stay active. She states that her stress is less.  She has been speaking to her therapist and is working to prioritize her own health. She is now being seen at the Outpatient Clinic. A1C noted 10/08/2019 11.6% decreased from 13% 04/22/2019  History includes Type 2 diabetes since 2008, endometrial cancer (in remission per patient), OSA but does not like C-pap, HTN, HLD, depression, vitamin D deficiency, pancreatitis, fatty liver She smokes and has decreased from 2 packs to 4 cigarettes per day.  Medications include Lantus 42 units q am and pm, metformin ("but I want to quit as I read this is not good for my Liver or Kidneys"), phentermine, 2000 units vitamin D daily A1C 13% 04/2019 increased from 11.1% 01/20/2019  Weight hx: 280 lbs 10/21/2019 286 lbs 09/23/2019 280 lbs 07/21/2019 347 lbs 05/2014 400 lbs highest weight and lost due to drug abuse  Body Composition Scale Sept. 14, 2021  Current Body Weight 286 lbs  Total Body Fat % 48.3  Visceral Fat 20   Fat-Free Mass % 51.6   Total Body Water % 40.3  Muscle-Mass lbs 32.1  BMI 46.2  Body Fat Displacement          Torso  lbs 85.9         Left Leg  lbs 17.1         Right Leg  lbs 17.1         Left Arm  lbs 8.5         Right Arm   lbs 8.5    Patient lives with her fiance.  He is currently taking chemo.  She does the shopping and cooking. She feels isolated since covid. They avoid pork, chicken, and recently stopped chips and regular soda, uses some salt.  She reported drug use and food security issues 10 years ago. She is on disability.  Gets SNAP. Used to do [employer] for exercise. Fiance is taking chemo.    Diabetes Self-Management Education - 10/21/19 1800      Visit Information   Visit Type Follow-up      Initial Visit   Diabetes Type Type 2    Are you taking your medications as prescribed? Yes      Pre-Education Assessment   Patient understands the diabetes disease and treatment process. Demonstrates understanding / competency    Patient understands incorporating nutritional management into lifestyle. Needs Review    Patient undertands incorporating physical activity into lifestyle. Needs Review    Patient understands using  medications safely. Needs Review    Patient understands monitoring blood glucose, interpreting and using results Needs Review    Patient understands prevention, detection, and treatment of acute complications. Demonstrates understanding / competency    Patient understands prevention, detection, and treatment of chronic complications. Demonstrates understanding / competency    Patient understands how to develop strategies to address psychosocial issues. Needs Review    Patient understands how to develop strategies to promote health/change behavior. Needs Review      Complications   Last HgB A1C per patient/outside source 11.6 %   10/08/2019   How often do you check your blood sugar? 3-4 times / week    Fasting Blood glucose range (mg/dL) 130-179     Postprandial Blood glucose range (mg/dL) 130-179;180-200;>200    Number of hypoglycemic episodes per month 0    Number of hyperglycemic episodes per week 7      Dietary Intake   Breakfast 1 slice [place] bacon, toast    Lunch [place] sandwich on Ww, 1/2 cup pinto beans    Dinner [employer], dinner roll, salad, string beans    Beverage(s) water, diet soda,(caffein free)      Exercise   Exercise Type Light (walking / raking leaves)    How many days per week to you exercise? 7    How many minutes per day do you exercise? 15    Total minutes per week of exercise 105      Patient Education   Previous Diabetes Education Yes (please comment)   1 month ago   Nutrition management  Role of diet in the treatment of diabetes and the relationship between the three main macronutrients and blood glucose level;Meal options for control of blood glucose level and chronic complications.    Physical activity and exercise  Role of exercise on diabetes management, blood pressure control and cardiac health.    Medications Reviewed patients medication for diabetes, action, purpose, timing of dose and side effects.    Monitoring Identified appropriate SMBG and/or A1C goals.    Acute complications Taught treatment of hypoglycemia - the 15 rule.    Psychosocial adjustment Worked with patient to identify barriers to care and solutions;Role of stress on diabetes    Personal strategies to promote health Lifestyle issues that need to be addressed for better diabetes care      Individualized Goals (developed by patient)   Nutrition General guidelines for healthy choices and portions discussed    Physical Activity Exercise 3-5 times per week;15 minutes per day    Medications take my medication as prescribed    Monitoring  test my blood glucose as discussed    Reducing Risk examine blood glucose patterns;increase portions of healthy fats    Health Coping discuss diabetes with (comment)   MD, RD, CDCES     Patient  Self-Evaluation of Goals - Patient rates self as meeting previously set goals (% of time)   Nutrition >75%    Physical Activity 50 - 75 %    Medications >75%    Monitoring >75%    Problem Solving >75%    Reducing Risk >75%    Health Coping >75%      Post-Education Assessment   Patient understands the diabetes disease and treatment process. Demonstrates understanding / competency    Patient understands incorporating nutritional management into lifestyle. Needs Review    Patient undertands incorporating physical activity into lifestyle. Needs Review    Patient understands using medications safely. Needs Review  Patient understands monitoring blood glucose, interpreting and using results Demonstrates understanding / competency    Patient understands prevention, detection, and treatment of acute complications. Demonstrates understanding / competency    Patient understands prevention, detection, and treatment of chronic complications. Demonstrates understanding / competency    Patient understands how to develop strategies to address psychosocial issues. Needs Review    Patient understands how to develop strategies to promote health/change behavior. Needs Review      Outcomes   Expected Outcomes Demonstrated interest in learning. Expect positive outcomes    Future DMSE 4-6 wks    Program Status Not Completed      Subsequent Visit   Since your last visit have you continued or begun to take your medications as prescribed? Yes    Since your last visit have you experienced any weight changes? Loss    Weight Loss (lbs) 6    Since your last visit, are you checking your blood glucose at least once a day? Yes           Individualized Plan for Diabetes Self-Management Training:   Learning Objective:  Patient will have a greater understanding of diabetes self-management. Patient education plan is to attend individual and/or group sessions per assessed needs and concerns.   Plan:   Patient  Instructions  Take your Novolog 15 minutes before each meal. Continue to choose foods that are baked more often than fried.   Continue to drink water and other unsweetened (zero carbohydrate containing) soda. Continue label reading. Be sure to eat carbohydrate with each meal. 1/2 your plate should be non starchy vegetables Small portion of protein with each meal Continue to check your blood sugar   Expected Outcomes:  Demonstrated interest in learning. Expect positive outcomes  Education material provided: Food label handouts and My Plate, Types of insulin and action, blood glucose and A1C goals  If problems or questions, patient to contact team via:  Phone  Future DSME appointment: 4-6 wks

## 2019-10-22 ENCOUNTER — Ambulatory Visit: Payer: Medicaid Other | Admitting: Internal Medicine

## 2019-10-22 ENCOUNTER — Encounter: Payer: Self-pay | Admitting: Internal Medicine

## 2019-10-22 VITALS — BP 140/78 | HR 73 | Wt 282.7 lb

## 2019-10-22 DIAGNOSIS — E1169 Type 2 diabetes mellitus with other specified complication: Secondary | ICD-10-CM | POA: Diagnosis not present

## 2019-10-22 DIAGNOSIS — E785 Hyperlipidemia, unspecified: Secondary | ICD-10-CM

## 2019-10-22 DIAGNOSIS — F411 Generalized anxiety disorder: Secondary | ICD-10-CM | POA: Diagnosis not present

## 2019-10-22 DIAGNOSIS — F17200 Nicotine dependence, unspecified, uncomplicated: Secondary | ICD-10-CM

## 2019-10-22 DIAGNOSIS — M159 Polyosteoarthritis, unspecified: Secondary | ICD-10-CM

## 2019-10-22 DIAGNOSIS — K760 Fatty (change of) liver, not elsewhere classified: Secondary | ICD-10-CM

## 2019-10-22 DIAGNOSIS — C541 Malignant neoplasm of endometrium: Secondary | ICD-10-CM | POA: Diagnosis not present

## 2019-10-22 DIAGNOSIS — F17210 Nicotine dependence, cigarettes, uncomplicated: Secondary | ICD-10-CM | POA: Diagnosis not present

## 2019-10-22 DIAGNOSIS — I10 Essential (primary) hypertension: Secondary | ICD-10-CM

## 2019-10-22 DIAGNOSIS — Z794 Long term (current) use of insulin: Secondary | ICD-10-CM | POA: Diagnosis not present

## 2019-10-22 DIAGNOSIS — M130 Polyarthritis, unspecified: Secondary | ICD-10-CM | POA: Diagnosis not present

## 2019-10-22 DIAGNOSIS — M1990 Unspecified osteoarthritis, unspecified site: Secondary | ICD-10-CM

## 2019-10-22 LAB — POCT GLYCOSYLATED HEMOGLOBIN (HGB A1C): Hemoglobin A1C: 11.5 % — AB (ref 4.0–5.6)

## 2019-10-22 LAB — GLUCOSE, CAPILLARY: Glucose-Capillary: 77 mg/dL (ref 70–99)

## 2019-10-22 MED ORDER — CYCLOBENZAPRINE HCL 5 MG PO TABS: 5.0000 mg | ORAL_TABLET | Freq: Two times a day (BID) | ORAL | 0 refills | Status: AC | PRN

## 2019-10-22 MED ORDER — LISINOPRIL 10 MG PO TABS: 10.0000 mg | ORAL_TABLET | Freq: Every day | ORAL | 2 refills | Status: DC

## 2019-10-22 MED ORDER — DICLOFENAC SODIUM 1 % EX GEL: 4.0000 g | Freq: Four times a day (QID) | CUTANEOUS | 3 refills | Status: DC
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
## 2019-10-22 NOTE — Patient Instructions (Signed)
Thank you, [name] for allowing [name] to provide your care today. Today we discussed Diabetes, depression, blood pressure.    I have ordered the following labs for you:   Lab Orders     BMP8+Anion Gap     Lipid Profile     POC Hbg A1C   I will call if any are abnormal. All of your labs can be accessed through "My Chart".  I have place a referrals to none.  I have ordered the following tests: none   I have ordered the following medication/changed the following medications:  1. increasing lisinorpil to 10 mg daily 2. discontinue robaxin and begin Flexiril 5 mg nightly as needed for pain  Please follow-up 1 month.  GOALS:   Should you have any questions or concerns please call the internal medicine clinic at [phone].    Tara Keller, D.O. [place]

## 2019-10-23 ENCOUNTER — Encounter: Payer: Self-pay | Admitting: Internal Medicine

## 2019-10-23 DIAGNOSIS — I10 Essential (primary) hypertension: Secondary | ICD-10-CM | POA: Insufficient documentation

## 2019-10-23 LAB — BMP8+ANION GAP
Anion Gap: 18 mmol/L (ref 10.0–18.0)
BUN/Creatinine Ratio: 9 (ref 9–23)
BUN: 6 mg/dL (ref 6–24)
CO2: 24 mmol/L (ref 20–29)
Calcium: 10 mg/dL (ref 8.7–10.2)
Chloride: 100 mmol/L (ref 96–106)
Creatinine, Ser: 0.67 mg/dL (ref 0.57–1.00)
GFR calc Af Amer: 114 mL/min/{1.73_m2} (ref >59)
GFR calc non Af Amer: 99 mL/min/{1.73_m2} (ref >59)
Glucose: 72 mg/dL (ref 65–99)
Potassium: 3.6 mmol/L (ref 3.5–5.2)
Sodium: 142 mmol/L (ref 134–144)

## 2019-10-23 LAB — HEPATIC FUNCTION PANEL
ALT: 20 IU/L (ref 0–32)
AST: 12 IU/L (ref 0–40)
Albumin: 4.2 g/dL (ref 3.8–4.9)
Alkaline Phosphatase: 221 IU/L — ABNORMAL HIGH (ref 44–121)
Bilirubin Total: <0.2 mg/dL (ref 0.0–1.2)
Bilirubin, Direct: <0.1 mg/dL (ref 0.00–0.40)
Total Protein: 7.7 g/dL (ref 6.0–8.5)

## 2019-10-23 LAB — LIPID PANEL
Chol/HDL Ratio: 3.2 ratio (ref 0.0–4.4)
Cholesterol, Total: 126 mg/dL (ref 100–199)
HDL: 39 mg/dL — ABNORMAL LOW (ref >39)
LDL Chol Calc (NIH): 67 mg/dL (ref 0–99)
Triglycerides: 107 mg/dL (ref 0–149)
VLDL Cholesterol Cal: 20 mg/dL (ref 5–40)

## 2019-10-23 NOTE — Assessment & Plan Note (Signed)
Patient states that she smokes 1/4 packs/day for unknown years (unknown pack-year history). She has been in the contemplation stage of quitting prior to today's appointment. She is whiling to quit smoking in the next 30 days. She  believes her barriers to quitting are the following: fear of failing again and under a lot of stress now.  Through shared decision-making we set a smoking/tobacco cessation date of April 11, 2020   Plan: I advised patient to quit, and offered support. Discussed current use pattern.  I discussed social support, nicotine replacement strategies, and medications to assist with tobacco cessation.

## 2019-10-23 NOTE — Assessment & Plan Note (Addendum)
Patient presents for follow-up evaluation for diabetes.  Patient is currently on Metformin 750 twice daily NovoLog 12 units 3 times daily, and glargine 50 units nightly.  She states that she is tolerating these medications well.  Patient's hemoglobin A1c today is 11.5.  I counseled her on the need for annual diabetic eye and foot exams.  She will likely benefit from further medication management in the near future to achieve better glycemic control.  Current A1c goal of less than 8.  I will get a BMP to reassess her kidney function electrolytes and then start her on additional medications for diabetes.  She would likely benefit from starting a GLP-1 agonist for further glycemic control as well as weight loss.  Plan: - A1c today of 6.3 - Continue NovoLog 12 units 3 times daily, Lantus 50 units nightly -Assist the patient with glucometer so that she could appropriately track her blood sugars.  I instructed her to bring her log in with her in 1 month so that we can make further adjustments to her medications. -I will likely increase her Metformin to 2000 mg daily and consider starting a GLP-1 agonist.  In the short-term she may benefit from increasing her long-acting insulin. -Instructed patient to get diabetic eye exam. -I will reevaluate her feet in 1 month.

## 2019-10-23 NOTE — Assessment & Plan Note (Signed)
Patient has a history of peptic steatosis.  However repeat a hepatic panel today to reevaluate this.

## 2019-10-23 NOTE — Assessment & Plan Note (Signed)
Patient presents for reevaluation of hypertension.  She is currently taking lisinopril 5 mg and remains hypertensive at 140/78.  I will increase this medication to 10 mg daily.  Plan: -Increase lisinopril to 10 mg daily. -We will get a BMP today to assess kidney function and electrolytes.

## 2019-10-23 NOTE — Assessment & Plan Note (Signed)
Patient admits to longstanding history of osteoarthritis in knees, hips, and lower back.  She has been on Robaxin in the past to assist with this pain.  I will switch her to low-dose Flexeril nightly as most of her pain occurs when she is trying to fall asleep at night.  I counseled her on continuing Tylenol for pain management.  Considering her history of hepatic steatosis, I will get a hepatic panel today.  Regardless should be able to tolerate up to 2 g of acetaminophen daily in the setting of hepatic steatosis.  Plan: -Switch to Flexeril 5 mg nightly -Voltaren gel up to 4 times daily for knee pain. -She may need further evaluation of her joint pain at subsequent visits.

## 2019-10-23 NOTE — Assessment & Plan Note (Signed)
Patient currently has endometrial carcinoma and followed by oncologist at [facility].  She currently has a progestin IUD in place to treat this.  Continue to follow along.

## 2019-10-23 NOTE — Assessment & Plan Note (Signed)
Patient is currently on atorvastatin 20 mg and aspirin 81 mg.  It is not clear if she is ever had a history of coronary artery disease or peripheral artery disease.  Therefore, I believe she is taking these medications for primary prevention.  I will get a repeat lipid panel today to assess her 10-year risk of cardiovascular disease.   Plan: -Continue atorvastatin 20 mg and aspirin 81 mg -She will need to be reevaluated for need for aspirin 81 mg versus risk of bleeding [place] in the setting of recent history of falls. -Panel today.

## 2019-10-23 NOTE — Assessment & Plan Note (Signed)
Patient has a history of general anxiety disorder on hydroxyzine and bupropion.  She also has associated depression.  I believe her current medication management is controlling her depression well but she appears to continue to have significant anxiety particularly while she is under significant stress.  She is struggling with relationship problems with her daughter which makes it difficult for her to see her grandchildren.  Furthermore, her partner was recently diagnosed with recurrence of lymphoma.  She admits to being afraid of what might happen to him.  She states that she shares everything with her partner and relies on him for significant social and emotional support.  Patient's medications are currently being managed by Gerrie Nordmann at [facility].  I counseled the patient to continue to follow-up with [facility] for medication management.  She does see a counselor twice weekly to help with her depression anxiety.  I commended her on taking the steps to improve her mental health.  Plan: -Continue current medication management make adjustments if need be.

## 2019-10-24 ENCOUNTER — Other Ambulatory Visit: Payer: Self-pay | Admitting: Internal Medicine

## 2019-10-24 DIAGNOSIS — E1169 Type 2 diabetes mellitus with other specified complication: Secondary | ICD-10-CM

## 2019-10-24 MED ORDER — LANTUS SOLOSTAR 100 UNIT/ML ~~LOC~~ SOPN: 60.0000 [IU] | PEN_INJECTOR | Freq: Every day | SUBCUTANEOUS | 11 refills | Status: DC

## 2019-10-24 NOTE — Progress Notes (Unsigned)
Call patient regarding her most recent clinic results.  Informed her that her A1c was 11.3.  Discussed increasing Lantus to 60 units nightly.  I discussed the importance of checking her blood sugar every morning and with each meal.  Counseled her on the signs and symptoms to look out for for hypoglycemia.  Encouraged her to reach out to our office for any questions or concerns.   Plan: - increase Lantus to 60 units nightly.   Marianna Payment, D.O. [facility], PGY-2 Pager: [phone], Phone: [phone] Date 10/24/2019 Time 1:38 PM

## 2019-10-24 NOTE — Progress Notes (Signed)
Internal Medicine Clinic Attending  Case discussed with Dr. Coe  At the time of the visit.  We reviewed the resident's history and exam and pertinent patient test results.  I agree with the assessment, diagnosis, and plan of care documented in the resident's note.  

## 2019-11-05 ENCOUNTER — Other Ambulatory Visit: Payer: Self-pay | Admitting: Gastroenterology

## 2019-11-12 ENCOUNTER — Other Ambulatory Visit: Payer: Self-pay

## 2019-11-12 DIAGNOSIS — Z794 Long term (current) use of insulin: Secondary | ICD-10-CM

## 2019-11-12 NOTE — Telephone Encounter (Signed)
insulin aspart (NOVOLOG FLEXPEN) 100 UNIT/ML FlexPen, and accu-chek guide test strips to be filled @  [facility] [phone] - [address], [place] AT [place] Phone:  [phone]  Fax:  [phone]

## 2019-11-13 MED ORDER — ACCU-CHEK GUIDE VI STRP: ORAL_STRIP | 12 refills | Status: DC

## 2019-11-13 MED ORDER — NOVOLOG FLEXPEN 100 UNIT/ML ~~LOC~~ SOPN
12.0000 [IU] | PEN_INJECTOR | Freq: Three times a day (TID) | SUBCUTANEOUS | 11 refills | Status: DC
Start: 2019-11-13 — End: 2019-12-01

## 2019-11-19 ENCOUNTER — Other Ambulatory Visit: Payer: Self-pay

## 2019-11-19 DIAGNOSIS — E1169 Type 2 diabetes mellitus with other specified complication: Secondary | ICD-10-CM

## 2019-11-19 NOTE — Telephone Encounter (Signed)
glucose blood (ACCU-CHEK GUIDE) test strip, refill request @  [facility] [phone], [place] AT [place] Phone:  [phone]  Fax:  [phone]

## 2019-11-19 NOTE — Telephone Encounter (Signed)
RTC to patient, states she can't pick up the RX for the test strips and pharmacist told her they need a new RX. No alerts received from pharmacy.  RN called pharmacy and was placed on hold X 36min, then call disconnected.  Called back, no answer at pharmacy.  Current RX Sig states Use as directed, will forward to MD and ask to change this to Three times a day testing to see if pharmacy accepts RX w/ new Sig instructions.  Patient also states her handicap parking sticker expires soon and asking for medical recetification d/t pain from osteoarthritis knees, hip, back.   Handicap parking application placed in red team box. Thank you, SChaplin, RN,BSN

## 2019-11-20 MED ORDER — ATORVASTATIN CALCIUM 20 MG PO TABS: 20.0000 mg | ORAL_TABLET | Freq: Every day | ORAL | 1 refills | Status: DC

## 2019-11-20 MED ORDER — ACCU-CHEK GUIDE VI STRP: ORAL_STRIP | 12 refills | Status: DC

## 2019-11-24 ENCOUNTER — Ambulatory Visit: Payer: Medicaid Other | Admitting: Dietician

## 2019-11-25 ENCOUNTER — Other Ambulatory Visit: Payer: Self-pay | Admitting: *Deleted

## 2019-11-25 DIAGNOSIS — Z794 Long term (current) use of insulin: Secondary | ICD-10-CM

## 2019-11-25 DIAGNOSIS — E1169 Type 2 diabetes mellitus with other specified complication: Secondary | ICD-10-CM

## 2019-11-25 NOTE — Telephone Encounter (Signed)
Patient walked in requesting refill on atorvastatin and test strips. Explained these were both sent to [facility] on 11/20/2019. Also, requested refill on flexeril. Not on current med list and patient states it's causing her to itch. Patient is due for 1 month f/u. Appt given for 11/28/2019 with PCP. Explained she can discuss need for a different muscle relaxant at that time. She agrees. Flexeril added to allergy list. Hubbard Hartshorn, BSN, RN-BC

## 2019-11-26 MED ORDER — ACCU-CHEK GUIDE VI STRP: ORAL_STRIP | 12 refills | Status: DC

## 2019-11-26 MED ORDER — ATORVASTATIN CALCIUM 20 MG PO TABS: 20.0000 mg | ORAL_TABLET | Freq: Every day | ORAL | 1 refills | Status: DC

## 2019-11-26 NOTE — Addendum Note (Signed)
Addended by: Velora Heckler on: 11/26/2019 04:07 PM   Modules accepted: Orders

## 2019-11-26 NOTE — Telephone Encounter (Signed)
Patient called back stating when she went to [facility] she was told the Rxs for atorvastatin and test strips still need to be authorized. Spoke with Pharmacist who states Dr. Allyson Sabal is not registered with Medicaid. Will route to Attending Pool to resend. Hubbard Hartshorn, BSN, RN-BC

## 2019-11-28 ENCOUNTER — Other Ambulatory Visit: Payer: Self-pay

## 2019-11-28 ENCOUNTER — Ambulatory Visit: Payer: Medicaid Other | Admitting: Internal Medicine

## 2019-11-28 ENCOUNTER — Encounter: Payer: Self-pay | Admitting: Internal Medicine

## 2019-11-28 VITALS — BP 124/66 | HR 76 | Temp 98.1°F | Ht 66.0 in | Wt 286.0 lb

## 2019-11-28 DIAGNOSIS — I10 Essential (primary) hypertension: Secondary | ICD-10-CM

## 2019-11-28 DIAGNOSIS — E1142 Type 2 diabetes mellitus with diabetic polyneuropathy: Secondary | ICD-10-CM

## 2019-11-28 DIAGNOSIS — M130 Polyarthritis, unspecified: Secondary | ICD-10-CM | POA: Diagnosis not present

## 2019-11-28 DIAGNOSIS — Z794 Long term (current) use of insulin: Secondary | ICD-10-CM | POA: Diagnosis not present

## 2019-11-28 DIAGNOSIS — Z1231 Encounter for screening mammogram for malignant neoplasm of breast: Secondary | ICD-10-CM | POA: Diagnosis not present

## 2019-11-28 DIAGNOSIS — L299 Pruritus, unspecified: Secondary | ICD-10-CM | POA: Insufficient documentation

## 2019-11-28 MED ORDER — GABAPENTIN 100 MG PO CAPS: 100.0000 mg | ORAL_CAPSULE | Freq: Three times a day (TID) | ORAL | 0 refills | Status: DC

## 2019-11-28 MED ORDER — EMPAGLIFLOZIN 10 MG PO TABS: 10.0000 mg | ORAL_TABLET | Freq: Every day | ORAL | 2 refills | Status: DC

## 2019-11-28 NOTE — Assessment & Plan Note (Signed)
Patient continues to have severe pain in bilateral hips/legs spanning to her knees.  She was switched to Flexeril 5 mg nightly at her last visit along with Voltaren gel up to 4 times daily for knee pain.  She states that the Flexeril has not helped her pain at all.  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  Discussed that getting her diabetes under control will likely help with this pain significantly.  Also discussed increasing nighttime gabapentin dose.  See assessment and plan under diabetes.
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
## 2019-11-28 NOTE — Assessment & Plan Note (Signed)
Accu-Chek log shows 106 tests with an average blood glucose of 177.  Highest blood glucose 292, lowest 94.  Patient's last hemoglobin A1c was 11.5.  She is currently on Lantus 50 units nightly, NovoLog 12 units 3 times daily with meals and Metformin 750 mg twice daily.  Patient reports that she has a nervelike sensation in bilateral feet and upper extremities.  She also describes severe bilateral leg pain which spans from bilateral hips to her knees.  She describes this pain to be 10 out of 10 and sharp in nature.  She states this pain keeps her up at night.  She has tried many over-the-counter remedies such as Tylenol and ibuprofen with no relief.  Her bilateral hip/leg pain is consistent with diabetic amyotrophy.  Discussed with patient the importance of getting her diabetes under control as this will most likely help her pain as well.  Discussed increasing nighttime gabapentin dose to see if this will help alleviate her pain.  Also discussed starting another diabetic medication at this time due to uncontrolled diabetes.  Plan: -Start Jardiance 10 mg daily -Continue Lantus 50 units at nighttime, NovoLog 12 units 3 times daily with meals and Metformin 750 mg twice daily -Increase nighttime gabapentin dose to 200 to 300 mg  -Return to clinic in 3 months for follow-up

## 2019-11-28 NOTE — Assessment & Plan Note (Signed)
Vitals:   11/28/19 1420  BP: 124/66  Pulse: 76  Temp: 98.1 F (36.7 C)  SpO2: 100%   Patient's blood pressure 124/66.  Her lisinopril was increased to 10 mg from 5 mg at her last visit.  BMP within normal limits.  Plan to continue lisinopril 10 mg daily.

## 2019-11-28 NOTE — Assessment & Plan Note (Signed)
Patient reports a rash on her back which has been severely itching.  She states that this started a few days ago and she has tried over-the-counter antiaging ointments which alleviates her symptoms temporarily.  She denies any use of detergent or body washes.  On examination, there is no erythema or skin lesions on patient's back.  There are a few scratches which he attributes to her scratching.  Recommended patient continue using antiitch ointments and continue to monitor.

## 2019-11-28 NOTE — Progress Notes (Signed)
Internal Medicine Clinic Attending ° °Case discussed with Dr. Rehman  At the time of the visit.  We reviewed the resident’s history and exam and pertinent patient test results.  I agree with the assessment, diagnosis, and plan of care documented in the resident’s note.  ° °

## 2019-11-28 NOTE — Patient Instructions (Signed)
Tara Keller,   It was a pleasure meeting you today. Today we discussed the following:   1. Diabetes- I want you to start a medication called Jardiance 10 mg daily. I also want you to continue working on your diet and exercise. 2. Leg pain- I think that this may be due to your diabetes. It is very important we get this under control. For now I want you to try taking 200 mg of gabapentin at night. Increase this to 300 mg at night if 200 mg does not help your pain at all.  We will follow up in 3 months.    Please call the internal medicine center clinic if you have any questions or concerns, we may be able to help and keep you from a long and expensive emergency room wait. Our clinic and after hours phone number is [phone], the best time to call is Monday through Friday 9 am to 4 pm but there is always someone available 24/7 if you have an emergency. If you need medication refills please notify your pharmacy one week in advance and they will send [name] a request.   Thank you for allowing [name] to be a part of your care!

## 2019-11-30 ENCOUNTER — Other Ambulatory Visit: Payer: Self-pay | Admitting: Family Medicine

## 2019-12-01 ENCOUNTER — Other Ambulatory Visit: Payer: Self-pay | Admitting: *Deleted

## 2019-12-01 MED ORDER — NOVOLOG FLEXPEN 100 UNIT/ML ~~LOC~~ SOPN
12.0000 [IU] | PEN_INJECTOR | Freq: Three times a day (TID) | SUBCUTANEOUS | 11 refills | Status: DC
Start: 2019-12-01 — End: 2020-05-19

## 2019-12-01 MED ORDER — ACCU-CHEK FASTCLIX LANCETS MISC
1 refills | Status: DC
Start: 2019-12-01 — End: 2020-02-12

## 2019-12-01 NOTE — Telephone Encounter (Signed)
Call from pt explaining she needs a refill on accu-chek lancets and Novolog rx sent by Dr Allyson Sabal is not medicaid approved.   I called [facility] who stated prescriber is not medicaid approved - I will ask The Attending to re-send rxs.

## 2019-12-01 NOTE — Telephone Encounter (Signed)
Pt was called / informed of refills.

## 2019-12-04 ENCOUNTER — Other Ambulatory Visit: Payer: Self-pay

## 2019-12-04 ENCOUNTER — Encounter (HOSPITAL_COMMUNITY): Payer: Self-pay | Admitting: Emergency Medicine

## 2019-12-04 ENCOUNTER — Emergency Department (HOSPITAL_COMMUNITY)
Admission: EM | Admit: 2019-12-04 | Discharge: 2019-12-05 | Disposition: A | Discharge status: Home / Self Care | Payer: Medicaid Other | Attending: Emergency Medicine | Admitting: Emergency Medicine

## 2019-12-04 DIAGNOSIS — Z7984 Long term (current) use of oral hypoglycemic drugs: Secondary | ICD-10-CM | POA: Insufficient documentation

## 2019-12-04 DIAGNOSIS — Z8541 Personal history of malignant neoplasm of cervix uteri: Secondary | ICD-10-CM | POA: Diagnosis not present

## 2019-12-04 DIAGNOSIS — Z794 Long term (current) use of insulin: Secondary | ICD-10-CM | POA: Diagnosis not present

## 2019-12-04 DIAGNOSIS — I10 Essential (primary) hypertension: Secondary | ICD-10-CM | POA: Diagnosis not present

## 2019-12-04 DIAGNOSIS — J45909 Unspecified asthma, uncomplicated: Secondary | ICD-10-CM | POA: Diagnosis not present

## 2019-12-04 DIAGNOSIS — F17210 Nicotine dependence, cigarettes, uncomplicated: Secondary | ICD-10-CM | POA: Insufficient documentation

## 2019-12-04 DIAGNOSIS — E1140 Type 2 diabetes mellitus with diabetic neuropathy, unspecified: Secondary | ICD-10-CM | POA: Insufficient documentation

## 2019-12-04 DIAGNOSIS — Z7982 Long term (current) use of aspirin: Secondary | ICD-10-CM | POA: Insufficient documentation

## 2019-12-04 DIAGNOSIS — R0981 Nasal congestion: Secondary | ICD-10-CM | POA: Diagnosis present

## 2019-12-04 DIAGNOSIS — Z20822 Contact with and (suspected) exposure to covid-19: Secondary | ICD-10-CM | POA: Diagnosis not present

## 2019-12-04 DIAGNOSIS — Z79899 Other long term (current) drug therapy: Secondary | ICD-10-CM | POA: Insufficient documentation

## 2019-12-04 DIAGNOSIS — J0100 Acute maxillary sinusitis, unspecified: Secondary | ICD-10-CM | POA: Insufficient documentation

## 2019-12-04 NOTE — ED Triage Notes (Signed)
Pt reports sinus pressure X4 days.  OTC medication and prescriptions medication are not providing any relief.

## 2019-12-05 LAB — RESP PANEL BY RT-PCR (FLU A&B, COVID) ARPGX2
Influenza A by PCR: NEGATIVE
Influenza B by PCR: NEGATIVE
SARS Coronavirus 2 by RT PCR: NEGATIVE

## 2019-12-05 MED ORDER — AZITHROMYCIN 250 MG PO TABS: 250.0000 mg | ORAL_TABLET | Freq: Every day | ORAL | 0 refills | Status: DC

## 2019-12-05 MED ORDER — AZITHROMYCIN 250 MG PO TABS
1000.0000 mg | ORAL_TABLET | Freq: Once | ORAL | Status: AC
  Administered 2019-12-05: 1000 mg via ORAL
  Filled 2019-12-05: qty 4

## 2019-12-05 NOTE — Discharge Instructions (Signed)
Take the prescribed medication as directed.  You will be contacted if respiratory panel is positive, will also update into mychart. Follow-up with your primary care doctor. Return to the ED for new or worsening symptoms.
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
## 2019-12-05 NOTE — ED Notes (Signed)
E-signature pad unavailable at time of pt discharge. This RN discussed discharge materials with pt and answered all pt questions. Pt stated understanding of discharge material. ? ?

## 2019-12-11 ENCOUNTER — Ambulatory Visit: Payer: Medicaid Other | Admitting: Internal Medicine

## 2019-12-11 VITALS — BP 124/76 | HR 105 | Temp 98.6°F | Wt 283.2 lb

## 2019-12-11 DIAGNOSIS — B3789 Other sites of candidiasis: Secondary | ICD-10-CM

## 2019-12-11 DIAGNOSIS — H6691 Otitis media, unspecified, right ear: Secondary | ICD-10-CM | POA: Diagnosis present

## 2019-12-11 DIAGNOSIS — E139 Other specified diabetes mellitus without complications: Secondary | ICD-10-CM

## 2019-12-11 DIAGNOSIS — Z794 Long term (current) use of insulin: Secondary | ICD-10-CM

## 2019-12-11 DIAGNOSIS — H60501 Unspecified acute noninfective otitis externa, right ear: Secondary | ICD-10-CM

## 2019-12-11 DIAGNOSIS — H66001 Acute suppurative otitis media without spontaneous rupture of ear drum, right ear: Secondary | ICD-10-CM

## 2019-12-11 DIAGNOSIS — E1142 Type 2 diabetes mellitus with diabetic polyneuropathy: Secondary | ICD-10-CM

## 2019-12-11 DIAGNOSIS — H6690 Otitis media, unspecified, unspecified ear: Secondary | ICD-10-CM

## 2019-12-11 HISTORY — DX: Otitis media, unspecified, unspecified ear: H66.90

## 2019-12-11 MED ORDER — PEN NEEDLES 32G X 4 MM MISC: 2 refills | Status: DC

## 2019-12-11 MED ORDER — AMOXICILLIN-POT CLAVULANATE 875-125 MG PO TABS: 1.0000 | ORAL_TABLET | Freq: Two times a day (BID) | ORAL | 0 refills | Status: AC

## 2019-12-11 MED ORDER — CLOTRIMAZOLE 1 % EX CREA: 1.0000 "application " | TOPICAL_CREAM | Freq: Two times a day (BID) | CUTANEOUS | 0 refills | Status: DC

## 2019-12-11 NOTE — Patient Instructions (Signed)
Please take Augmentin - take one tablet every 12 hours for 7 days  Please follow-up in one week to make sure your infection has resolved.

## 2019-12-11 NOTE — Assessment & Plan Note (Signed)
Seen in the ER for sinus infection and prescribed azithromycin which did not help. She is having worsening right sided sinus and ear pain. No mastoid tenderness.  She has had recurrent ear infections for a long time. She was going to see ENT but was unable to afford the appointment.   - augmentin 875-125 bid for 7 days - f/u in one week  - CBC
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
## 2019-12-11 NOTE — Addendum Note (Signed)
Addended by: Molli Hazard A on: 12/11/2019 05:19 PM   Modules accepted: Orders

## 2019-12-12 LAB — CBC WITH DIFFERENTIAL/PLATELET
Basophils Absolute: 0 10*3/uL (ref 0.0–0.2)
Basos: 0 %
EOS (ABSOLUTE): 0.3 10*3/uL (ref 0.0–0.4)
Eos: 3 %
Hematocrit: 41.6 % (ref 34.0–46.6)
Hemoglobin: 13.9 g/dL (ref 11.1–15.9)
Immature Grans (Abs): 0.1 10*3/uL (ref 0.0–0.1)
Immature Granulocytes: 1 %
Lymphocytes Absolute: 5.2 10*3/uL — ABNORMAL HIGH (ref 0.7–3.1)
Lymphs: 47 %
MCH: 28.1 pg (ref 26.6–33.0)
MCHC: 33.4 g/dL (ref 31.5–35.7)
MCV: 84 fL (ref 79–97)
Monocytes Absolute: 0.8 10*3/uL (ref 0.1–0.9)
Monocytes: 7 %
Neutrophils Absolute: 4.7 10*3/uL (ref 1.4–7.0)
Neutrophils: 42 %
Platelets: 307 10*3/uL (ref 150–450)
RBC: 4.95 x10E6/uL (ref 3.77–5.28)
RDW: 12.9 % (ref 11.7–15.4)
WBC: 11 10*3/uL — ABNORMAL HIGH (ref 3.4–10.8)

## 2019-12-16 ENCOUNTER — Ambulatory Visit (INDEPENDENT_AMBULATORY_CARE_PROVIDER_SITE_OTHER): Payer: Medicaid Other | Admitting: Podiatry

## 2019-12-16 ENCOUNTER — Other Ambulatory Visit: Payer: Self-pay

## 2019-12-16 ENCOUNTER — Encounter: Payer: Self-pay | Admitting: Podiatry

## 2019-12-16 DIAGNOSIS — M79675 Pain in left toe(s): Secondary | ICD-10-CM

## 2019-12-16 DIAGNOSIS — M79674 Pain in right toe(s): Secondary | ICD-10-CM

## 2019-12-16 DIAGNOSIS — E1149 Type 2 diabetes mellitus with other diabetic neurological complication: Secondary | ICD-10-CM | POA: Diagnosis not present

## 2019-12-16 DIAGNOSIS — B351 Tinea unguium: Secondary | ICD-10-CM | POA: Diagnosis not present

## 2019-12-16 NOTE — Progress Notes (Signed)
Complaint:  Visit Type: Patient returns to my office for continued preventative foot care services. Complaint: Patient states" my nails have grown long and thick and become painful to walk and wear shoes" Patient has been diagnosed with DM with neuropathy. The patient presents for preventative foot care services.  Podiatric Exam: Vascular: dorsalis pedis and posterior tibial pulses are palpable bilateral. Capillary return is immediate. Temperature gradient is WNL. Skin turgor WNL  Sensorium: Diminished  Semmes Weinstein monofilament test. Normal tactile sensation bilaterally. Nail Exam: Pt has thick disfigured discolored nails with subungual debris noted bilateral entire nail hallux through fifth toenails Ulcer Exam: There is no evidence of ulcer or pre-ulcerative changes or infection. Orthopedic Exam: Muscle tone and strength are WNL. No limitations in general ROM. No crepitus or effusions noted. Foot type and digits show no abnormalities. Bony prominences are unremarkable. Skin: No Porokeratosis. No infection or ulcers  Diagnosis:  Onychomycosis, , Pain in right toe, pain in left toes  Treatment & Plan Procedures and Treatment: Consent by patient was obtained for treatment procedures.   Debridement of mycotic and hypertrophic toenails, 1 through 5 bilateral and clearing of subungual debris. No ulceration, no infection noted.  Return Visit-Office Procedure: Patient instructed to return to the office for a follow up visit 3 months for continued evaluation and treatment.    Gardiner Barefoot DPM

## 2019-12-20 ENCOUNTER — Encounter (HOSPITAL_COMMUNITY): Payer: Self-pay

## 2019-12-20 ENCOUNTER — Other Ambulatory Visit: Payer: Self-pay

## 2019-12-20 ENCOUNTER — Ambulatory Visit (HOSPITAL_COMMUNITY)
Admission: EM | Admit: 2019-12-20 | Discharge: 2019-12-20 | Disposition: A | Discharge status: Home / Self Care | Payer: Medicaid Other | Attending: Physician Assistant | Admitting: Physician Assistant

## 2019-12-20 DIAGNOSIS — B373 Candidiasis of vulva and vagina: Secondary | ICD-10-CM | POA: Diagnosis not present

## 2019-12-20 DIAGNOSIS — B3731 Acute candidiasis of vulva and vagina: Secondary | ICD-10-CM

## 2019-12-20 LAB — POCT URINALYSIS DIPSTICK, ED / UC
Bilirubin Urine: NEGATIVE
Glucose, UA: 500 mg/dL — AB
Ketones, ur: NEGATIVE mg/dL
Nitrite: NEGATIVE
Protein, ur: NEGATIVE mg/dL
Specific Gravity, Urine: 1.015 (ref 1.005–1.030)
Urobilinogen, UA: 0.2 mg/dL (ref 0.0–1.0)
pH: 6.5 (ref 5.0–8.0)

## 2019-12-20 MED ORDER — FLUCONAZOLE 150 MG PO TABS: 150.0000 mg | ORAL_TABLET | Freq: Every day | ORAL | 0 refills | Status: AC

## 2019-12-20 NOTE — Discharge Instructions (Signed)
Take medication as prescribed We will contact you in 2 - 5 days with lab results.

## 2019-12-20 NOTE — ED Triage Notes (Signed)
Patient presents to Urgent Care with complaints of a back rash noted a month ago, runny nose and headache since yesterday. Repots having yeast infection symptoms for 3-4 days exp. Burning with urination, itching, denies discharge. Patient reports DM medication changed to [facility] and believes symptoms related to medication. Last seen 12/2 at pcp  prescribed Augmentin for sinus infection. Pt states she notified pcp of the back rash.    Denies fever, N/V, diarrhea, abdominal pain, or hematuria .
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
## 2019-12-21 LAB — URINE CULTURE: Culture: NO GROWTH

## 2019-12-22 LAB — CERVICOVAGINAL ANCILLARY ONLY
Bacterial Vaginitis (gardnerella): POSITIVE — AB
Candida Glabrata: NEGATIVE
Candida Vaginitis: POSITIVE — AB
Chlamydia: NEGATIVE
Comment: NEGATIVE
Comment: NEGATIVE
Comment: NEGATIVE
Comment: NEGATIVE
Comment: NEGATIVE
Comment: NORMAL
Neisseria Gonorrhea: NEGATIVE
Trichomonas: NEGATIVE

## 2019-12-23 ENCOUNTER — Encounter: Payer: Self-pay | Admitting: Dietician

## 2019-12-23 ENCOUNTER — Telehealth (HOSPITAL_COMMUNITY): Payer: Self-pay | Admitting: Emergency Medicine

## 2019-12-23 ENCOUNTER — Other Ambulatory Visit: Payer: Self-pay

## 2019-12-23 ENCOUNTER — Encounter: Payer: Medicaid Other | Attending: Gynecologic Oncology | Admitting: Dietician

## 2019-12-23 DIAGNOSIS — Z794 Long term (current) use of insulin: Secondary | ICD-10-CM | POA: Diagnosis present

## 2019-12-23 DIAGNOSIS — E1169 Type 2 diabetes mellitus with other specified complication: Secondary | ICD-10-CM

## 2019-12-23 MED ORDER — METRONIDAZOLE 500 MG PO TABS: 500.0000 mg | ORAL_TABLET | Freq: Two times a day (BID) | ORAL | 0 refills | Status: DC

## 2019-12-23 NOTE — Progress Notes (Signed)
Diabetes Self-Management Education  Visit Type:  Follow up Diabetes  Appt. Start Time: 1510 Appt. End Time: 1540  12/23/2019  Tara Keller, identified by name and date of birth, is a 54 y.o. female with a diagnosis of Diabetes: Type 2 Diabetes  .   ASSESSMENT Patient is here today alone.  She was last seen by myself 10/21/2019. Complains of a toothache. Since last visit, she has been started on Jardiance.  Noted that she was seen once for a yeast infection since.  Discussed to speak with her MD if this persists as this is a side effect of Jardiance.  She states that she takes all of her medication as prescribed. Decreased smoking to 6 cigarettes per day (decreased from 2-4 packs daily). Blood glucose meter was reviewed.  Time in Range for the past week 43% increased from 35% in the past month.  (She does not note if the glucose is before or after a meal and this TIR is reflective of a variety of times.)  She denies hypoglycemia and none were noted on her meter. She states that she is staying active, walking 1/2 block and back most days, plays [employer] She stopped all soda (both diet and regular) and has been reading labels.   History includes Type 2 diabetes since 2008, endometrial cancer (in remission per patient), OSA but does not like C-pap, HTN, HLD, depression, vitamin D deficiency, pancreatitis, fatty liver She smokes and has decreased from 2 packs to 4 cigarettes per day.  Medications include Lantus 60 units q am and pm, Novolog 12 units 15 minutes before each meal, metformin ("but I want to quit as I read this is not good for my Liver or Kidneys"), Jardiance, phentermine, 2000 units vitamin D daily A1C 11.5% 10/22/2019 11.6% 09/30/19  Weight hx: 276.7 lbs 12/23/2019 280 lbs 10/21/2019 286 lbs 09/23/2019 280 lbs 07/21/2019 347 lbs 05/2014 400 lbs highest weight and lost due to drug abuse  Body Composition Scale Sept 14, 2021 Dec 23, 2019  Current Body Weight 286 lbs 276.7  lbs  Total Body Fat % 48.3 47.6  Visceral Fat 20 19  Fat-Free Mass % 51.6 52.3   Total Body Water % 40.3 40.6  Muscle-Mass lbs 32.1 32  BMI 46.2 44.6  Body Fat Displacement           Torso  lbs 85.9 81.7         Left Leg  lbs 17.1 16.3         Right Leg  lbs 17.1 16.3         Left Arm  lbs 8.5 8.1         Right Arm   lbs 8.5 8.1    Patient lives with her fiance.  He is currently taking chemo.  She does the shopping and cooking. She feels isolated since covid. They avoid pork, chicken, and recently stopped chips and regular soda, uses some salt.  She reported drug use and food security issues 10 years ago. She is on disability.  Gets SNAP. Used to do [employer] for exercise. Fiance is taking chemo for CLL and has increased pain and difficulty eating at times.   Weight 276 lb (125.2 kg), last menstrual period 01/23/2014. Body mass index is 44.55 kg/m.   Diabetes Self-Management Education - 12/23/19 1600      Pre-Education Assessment   Patient understands the diabetes disease and treatment process. Demonstrates understanding / competency    Patient understands incorporating nutritional management  into lifestyle. Needs Review    Patient undertands incorporating physical activity into lifestyle. Needs Review    Patient understands using medications safely. Demonstrates understanding / competency    Patient understands monitoring blood glucose, interpreting and using results Needs Review    Patient understands prevention, detection, and treatment of acute complications. Demonstrates understanding / competency    Patient understands prevention, detection, and treatment of chronic complications. Demonstrates understanding / competency    Patient understands how to develop strategies to address psychosocial issues. Needs Review    Patient understands how to develop strategies to promote health/change behavior. Needs Review      Complications   Last HgB A1C per patient/outside source  11.5 %   10/22/2019   How often do you check your blood sugar? 1-2 times/day    Postprandial Blood glucose range (mg/dL) 70-129;130-179    Number of hypoglycemic episodes per month 0      Dietary Intake   Breakfast Air fried fish or cornflakes with 2% milk    Snack (morning) none    Lunch baked hamburger patty, 1 slice [employer] bread, green beans    Snack (afternoon) none    Dinner 2 hot dogs without buns, green beans    Snack (evening) none    Beverage(s) water      Exercise   Exercise Type Light (walking / raking leaves)    How many days per week to you exercise? 7    How many minutes per day do you exercise? 25    Total minutes per week of exercise 175      Patient Education   Previous Diabetes Education Yes (please comment)   10/2019   Nutrition management  Food label reading, portion sizes and measuring food.;Meal options for control of blood glucose level and chronic complications.    Physical activity and exercise  Role of exercise on diabetes management, blood pressure control and cardiac health.    Medications Reviewed patients medication for diabetes, action, purpose, timing of dose and side effects.    Monitoring Taught/discussed recording of test results and interpretation of SMBG.;Identified appropriate SMBG and/or A1C goals.    Acute complications Taught treatment of hypoglycemia - the 15 rule.    Psychosocial adjustment Role of stress on diabetes    Personal strategies to promote health Lifestyle issues that need to be addressed for better diabetes care      Individualized Goals (developed by patient)   Nutrition General guidelines for healthy choices and portions discussed    Physical Activity Exercise 5-7 days per week;30 minutes per day    Medications take my medication as prescribed    Monitoring  test my blood glucose as discussed    Reducing Risk stop smoking;examine blood glucose patterns;do foot checks daily      Patient Self-Evaluation of Goals - Patient rates  self as meeting previously set goals (% of time)   Nutrition >75%    Physical Activity >75%    Medications >75%    Monitoring >75%    Problem Solving >75%    Reducing Risk >75%    Health Coping >75%      Post-Education Assessment   Patient understands the diabetes disease and treatment process. Demonstrates understanding / competency    Patient understands incorporating nutritional management into lifestyle. Needs Review    Patient undertands incorporating physical activity into lifestyle. Demonstrates understanding / competency    Patient understands using medications safely. Needs Review    Patient understands monitoring blood glucose, interpreting  and using results Needs Review    Patient understands prevention, detection, and treatment of acute complications. Demonstrates understanding / competency    Patient understands prevention, detection, and treatment of chronic complications. Demonstrates understanding / competency    Patient understands how to develop strategies to address psychosocial issues. Needs Review    Patient understands how to develop strategies to promote health/change behavior. Needs Review      Outcomes   Expected Outcomes Demonstrated interest in learning. Expect positive outcomes    Future DMSE 4-6 wks    Program Status Not Completed      Subsequent Visit   Since your last visit have you experienced any weight changes? Loss    Weight Loss (lbs) 4    Since your last visit, are you checking your blood glucose at least once a day? Yes           Individualized Plan for Diabetes Self-Management Training:   Learning Objective:  Patient will have a greater understanding of diabetes self-management. Patient education plan is to attend individual and/or group sessions per assessed needs and concerns.   Plan:   Patient Instructions  Plan:  Aim for 2-3 Carb Choices per meal (30-45 grams) +/- 1 either way  Aim for 0-5 Carbs per snack if hungry  Include  protein in moderation with your meals and snacks Consider reading food labels for Total Carbohydrate of foods Consider  increasing your activity level by walking for 30 minutes daily as tolerated Continuechecking BG at alternate times per day  Continue taking medication as directed by MD      Expected Outcomes:  Demonstrated interest in learning. Expect positive outcomes  Education material provided: Food label handouts and Meal plan card, hypoglycemia treatment and symptoms, Blood sugar goals (fasting and post meal)  If problems or questions, patient to contact team via:  Phone  Future DSME appointment: 4-6 wks

## 2019-12-23 NOTE — Patient Instructions (Signed)
Plan:  Aim for 2-3 Carb Choices per meal (30-45 grams) +/- 1 either way  Aim for 0-5 Carbs per snack if hungry  Include protein in moderation with your meals and snacks Consider reading food labels for Total Carbohydrate of foods Consider  increasing your activity level by walking for 30 minutes daily as tolerated Continuechecking BG at alternate times per day  Continue taking medication as directed by MD

## 2019-12-23 NOTE — Progress Notes (Signed)
Internal Medicine Clinic Attending  Case discussed with Dr. Seawell at the time of the visit.  We reviewed the resident's history and exam and pertinent patient test results.  I agree with the assessment, diagnosis, and plan of care documented in the resident's note.  Jamarie Joplin, M.D., Ph.D.  

## 2019-12-24 ENCOUNTER — Ambulatory Visit (HOSPITAL_COMMUNITY)
Admission: RE | Admit: 2019-12-24 | Discharge: 2019-12-24 | Disposition: A | Discharge status: Home / Self Care | Payer: Medicaid Other | Source: Ambulatory Visit | Attending: Internal Medicine | Admitting: Internal Medicine

## 2019-12-24 ENCOUNTER — Ambulatory Visit: Payer: Medicaid Other | Admitting: Internal Medicine

## 2019-12-24 VITALS — BP 138/82 | HR 79 | Temp 98.6°F | Wt 284.4 lb

## 2019-12-24 DIAGNOSIS — R590 Localized enlarged lymph nodes: Secondary | ICD-10-CM | POA: Diagnosis not present

## 2019-12-24 DIAGNOSIS — E785 Hyperlipidemia, unspecified: Secondary | ICD-10-CM | POA: Diagnosis not present

## 2019-12-24 DIAGNOSIS — F17200 Nicotine dependence, unspecified, uncomplicated: Secondary | ICD-10-CM | POA: Diagnosis not present

## 2019-12-24 DIAGNOSIS — M5441 Lumbago with sciatica, right side: Secondary | ICD-10-CM

## 2019-12-24 DIAGNOSIS — E119 Type 2 diabetes mellitus without complications: Secondary | ICD-10-CM | POA: Insufficient documentation

## 2019-12-24 DIAGNOSIS — I10 Essential (primary) hypertension: Secondary | ICD-10-CM | POA: Diagnosis not present

## 2019-12-24 DIAGNOSIS — R0782 Intercostal pain: Secondary | ICD-10-CM | POA: Diagnosis not present

## 2019-12-24 DIAGNOSIS — R079 Chest pain, unspecified: Secondary | ICD-10-CM | POA: Diagnosis present

## 2019-12-24 MED ORDER — IBUPROFEN 600 MG PO TABS: 600.0000 mg | ORAL_TABLET | Freq: Four times a day (QID) | ORAL | 0 refills | Status: DC | PRN

## 2019-12-24 NOTE — Patient Instructions (Signed)
Ms. Gauthier,  It was a pleasure meeting you today! Today we talked about your chest pain and swollen lymph node. Your symptoms for chest pain are consistent with costochondritis, we will prescribe you ibuprofen to take as needed. Additionally, continue to watch your swollen lymph node. If it gets bigger, please call the clinic for an appointment. We will have you come back in 1 month's time. Happy holidays! Sincerely,  Maudie Mercury, MD
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
## 2019-12-26 ENCOUNTER — Telehealth: Payer: Self-pay | Admitting: Podiatry

## 2019-12-26 ENCOUNTER — Encounter: Payer: Self-pay | Admitting: Internal Medicine

## 2019-12-26 DIAGNOSIS — R590 Localized enlarged lymph nodes: Secondary | ICD-10-CM | POA: Insufficient documentation

## 2019-12-26 DIAGNOSIS — R079 Chest pain, unspecified: Secondary | ICD-10-CM | POA: Insufficient documentation

## 2019-12-26 NOTE — Progress Notes (Signed)
Internal Medicine Clinic Attending  Case discussed with Dr. Winters at the time of the visit.  We reviewed the resident's history and exam and pertinent patient test results.  I agree with the assessment, diagnosis, and plan of care documented in the resident's note.  Mirissa Lopresti, M.D., Ph.D.  

## 2019-12-26 NOTE — Assessment & Plan Note (Signed)
Patient presents with one day of pinpoint chest pain. Patient states that the pain feels like "being poked with a needle." The pain is an 8/10, with no radiation. Nothing seems to aggravate her pain, and she has not tried any alleviating measures.   A/P:  Patient presents with one day of chest pain. While she does have risk factors for cardiac disease, T2DM, smoking, HTN, dyslipidemia, but her initial EKG is shows no overt ischemic changes. And her vitals are stable. Pain is reproducible on palpation of the anterior R chest wall. She denies SHOB and pain on deep inspiration, with no tachycardia, which is not typical for presentation of PE. Likely, this patient has costochondritis. Will treat conservatively with follow up in one month's time. -  Advil 600 mg Q6H PRN - Follow up in one month's time

## 2019-12-26 NOTE — Telephone Encounter (Signed)
Pt was scheduled to see Liliane Channel on Monday for diabetic shoes and upon looking her insurance will not cover them in our office.  I contacted pt and let her know prior to her appt. She would like a prescription to go to [facility] or possibly [facility].

## 2019-12-26 NOTE — Assessment & Plan Note (Signed)
Patient presents with back pain for a week's time. She states that she cannot recall any instigating factors. Pain is localized to the R lower back, with no radiation. Patient does not endorse any aggravating factors, has not tried anything to alleviate her pain. Denies lower extremity weakness or red flag signs.   A/P Tenderness along the R side paraspinal muscles, no pain over the vertebral bodies with no warning signs. Will treat conservatively.  - Back exercise - Ibuprofen 600 mg Q6H PRN

## 2019-12-26 NOTE — Assessment & Plan Note (Signed)
Patient with complaints of a painful bump on her neck for one week's time presents to the office. Patient does endorse allergies/sinus infections this time of year. No tenderness to the sinus passages on palpation. Well circumscribed, mobile, and tender lymph node palpated in the R submental region. Overall reassuring. Patient to monitor, and to inform the clinic if it continues to enlarge. Will F/U in one month's time.  P: - Continue to observe - Advil 600 mg Q6H PRN

## 2019-12-29 ENCOUNTER — Ambulatory Visit: Payer: Medicaid Other | Admitting: Orthotics

## 2020-01-04 ENCOUNTER — Other Ambulatory Visit: Payer: Self-pay

## 2020-01-04 ENCOUNTER — Encounter (HOSPITAL_COMMUNITY): Payer: Self-pay

## 2020-01-04 ENCOUNTER — Ambulatory Visit (HOSPITAL_COMMUNITY)
Admission: EM | Admit: 2020-01-04 | Discharge: 2020-01-04 | Disposition: A | Discharge status: Home / Self Care | Payer: Medicaid Other | Attending: Physician Assistant | Admitting: Physician Assistant

## 2020-01-04 DIAGNOSIS — Z20822 Contact with and (suspected) exposure to covid-19: Secondary | ICD-10-CM | POA: Insufficient documentation

## 2020-01-04 DIAGNOSIS — J029 Acute pharyngitis, unspecified: Secondary | ICD-10-CM

## 2020-01-04 LAB — SARS CORONAVIRUS 2 (TAT 6-24 HRS): SARS Coronavirus 2: NEGATIVE

## 2020-01-04 LAB — POCT RAPID STREP A, ED / UC: Streptococcus, Group A Screen (Direct): NEGATIVE

## 2020-01-04 MED ORDER — LIDOCAINE VISCOUS HCL 2 % MT SOLN: 15.0000 mL | Freq: Four times a day (QID) | OROMUCOSAL | 0 refills | Status: DC | PRN
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
## 2020-01-04 NOTE — Discharge Instructions (Addendum)
Remain in isolation pending COVID test

## 2020-01-04 NOTE — ED Triage Notes (Signed)
Pt in with c/o ST and bilat neck pain that has been going on for about 3 days. Also c/o headache and pain when swallowing  Pt took ibuprofen with no relief

## 2020-01-06 ENCOUNTER — Telehealth: Payer: Self-pay

## 2020-01-06 LAB — CULTURE, GROUP A STREP (THRC)

## 2020-01-06 NOTE — Telephone Encounter (Signed)
Called pt and reassured her that the meds prescribed in ED are fine for her to take. She is agreeable

## 2020-01-06 NOTE — Telephone Encounter (Signed)
Pt called due  To she went to the urgent care for neck 01/04/20 was hurting ... 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

## 2020-01-12 ENCOUNTER — Ambulatory Visit: Payer: Medicaid Other | Admitting: Internal Medicine

## 2020-01-28 ENCOUNTER — Other Ambulatory Visit: Payer: Self-pay | Admitting: Gastroenterology

## 2020-01-29 ENCOUNTER — Encounter (HOSPITAL_COMMUNITY): Payer: Self-pay

## 2020-01-29 ENCOUNTER — Other Ambulatory Visit: Payer: Self-pay

## 2020-01-29 ENCOUNTER — Ambulatory Visit (HOSPITAL_COMMUNITY)
Admission: EM | Admit: 2020-01-29 | Discharge: 2020-01-29 | Disposition: A | Discharge status: Home / Self Care | Payer: Medicaid Other | Attending: Student | Admitting: Student

## 2020-01-29 DIAGNOSIS — J45909 Unspecified asthma, uncomplicated: Secondary | ICD-10-CM | POA: Insufficient documentation

## 2020-01-29 DIAGNOSIS — J329 Chronic sinusitis, unspecified: Secondary | ICD-10-CM | POA: Diagnosis not present

## 2020-01-29 DIAGNOSIS — Z7982 Long term (current) use of aspirin: Secondary | ICD-10-CM | POA: Diagnosis not present

## 2020-01-29 DIAGNOSIS — Z20822 Contact with and (suspected) exposure to covid-19: Secondary | ICD-10-CM | POA: Insufficient documentation

## 2020-01-29 DIAGNOSIS — Z79899 Other long term (current) drug therapy: Secondary | ICD-10-CM | POA: Insufficient documentation

## 2020-01-29 DIAGNOSIS — J069 Acute upper respiratory infection, unspecified: Secondary | ICD-10-CM | POA: Insufficient documentation

## 2020-01-29 DIAGNOSIS — Z794 Long term (current) use of insulin: Secondary | ICD-10-CM | POA: Diagnosis not present

## 2020-01-29 DIAGNOSIS — F17210 Nicotine dependence, cigarettes, uncomplicated: Secondary | ICD-10-CM | POA: Diagnosis not present

## 2020-01-29 DIAGNOSIS — H66005 Acute suppurative otitis media without spontaneous rupture of ear drum, recurrent, left ear: Secondary | ICD-10-CM | POA: Diagnosis present

## 2020-01-29 MED ORDER — AMOXICILLIN-POT CLAVULANATE 875-125 MG PO TABS: 1.0000 | ORAL_TABLET | Freq: Two times a day (BID) | ORAL | 0 refills | Status: DC

## 2020-01-29 NOTE — ED Triage Notes (Signed)
Pt presents with sinus pressure, headache and left ear pain x 5 days. Denies fever, cough, shortness of breath.

## 2020-01-29 NOTE — Discharge Instructions (Addendum)
-  Augmentin twice daily for 7 days -Talk to your doctor about referral to [place] and Throat doctor, for further evaluation of recurrent ear and sinus infections
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
## 2020-01-30 ENCOUNTER — Ambulatory Visit (HOSPITAL_COMMUNITY)
Admission: EM | Admit: 2020-01-30 | Discharge: 2020-01-30 | Disposition: A | Discharge status: Home / Self Care | Payer: Medicaid Other | Attending: Family Medicine | Admitting: Family Medicine

## 2020-01-30 ENCOUNTER — Encounter (HOSPITAL_COMMUNITY): Payer: Self-pay

## 2020-01-30 ENCOUNTER — Other Ambulatory Visit: Payer: Self-pay

## 2020-01-30 DIAGNOSIS — J029 Acute pharyngitis, unspecified: Secondary | ICD-10-CM

## 2020-01-30 DIAGNOSIS — H6983 Other specified disorders of Eustachian tube, bilateral: Secondary | ICD-10-CM

## 2020-01-30 LAB — SARS CORONAVIRUS 2 (TAT 6-24 HRS): SARS Coronavirus 2: NEGATIVE

## 2020-01-30 MED ORDER — DEXAMETHASONE SODIUM PHOSPHATE 10 MG/ML IJ SOLN
5.0000 mg | Freq: Once | INTRAMUSCULAR | Status: AC
  Administered 2020-01-30: 5 mg via INTRAMUSCULAR

## 2020-01-30 MED ORDER — KETOROLAC TROMETHAMINE 30 MG/ML IJ SOLN
INTRAMUSCULAR | Status: AC
  Filled 2020-01-30: qty 1

## 2020-01-30 MED ORDER — KETOROLAC TROMETHAMINE 30 MG/ML IJ SOLN
30.0000 mg | Freq: Once | INTRAMUSCULAR | Status: AC
  Administered 2020-01-30: 30 mg via INTRAMUSCULAR

## 2020-01-30 MED ORDER — LIDOCAINE VISCOUS HCL 2 % MT SOLN: 15.0000 mL | OROMUCOSAL | 0 refills | Status: DC | PRN
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
## 2020-01-30 NOTE — Discharge Instructions (Addendum)
Take 400 mg of ibuprofen every 4-6 hours as needed for throat pain. I have prescribed lidocaine viscous to mix with warm salt water gargle and spit as needed daily for throat pain.  Complete prescription that you were prescribed here in clinic yesterday. You are not having an allergic reaction , you have new symptoms of sore throat and ear pain.

## 2020-01-30 NOTE — ED Triage Notes (Signed)
Pt in with c/o severe throat pain that started last night after she took augmentin last night  States she was not having throat pain until after she took the medication   Throat painful to touch

## 2020-02-04 ENCOUNTER — Telehealth: Payer: Self-pay

## 2020-02-04 ENCOUNTER — Telehealth (HOSPITAL_COMMUNITY): Payer: Self-pay | Admitting: Emergency Medicine

## 2020-02-04 ENCOUNTER — Ambulatory Visit (INDEPENDENT_AMBULATORY_CARE_PROVIDER_SITE_OTHER): Payer: Medicaid Other | Admitting: Internal Medicine

## 2020-02-04 ENCOUNTER — Other Ambulatory Visit: Payer: Self-pay

## 2020-02-04 DIAGNOSIS — B373 Candidiasis of vulva and vagina: Secondary | ICD-10-CM

## 2020-02-04 DIAGNOSIS — B3731 Acute candidiasis of vulva and vagina: Secondary | ICD-10-CM

## 2020-02-04 MED ORDER — FLUCONAZOLE 150 MG PO TABS: 150.0000 mg | ORAL_TABLET | Freq: Once | ORAL | 0 refills | Status: AC

## 2020-02-04 NOTE — Telephone Encounter (Signed)
Added to Dr. Clearence Cheek schedule Laurence Compton, RN,BSN

## 2020-02-04 NOTE — Telephone Encounter (Signed)
Patient will need a telehealth appointment to discuss these issues. She can be added to my schedule today and I will go ahead and talk to her now.

## 2020-02-04 NOTE — Telephone Encounter (Signed)
Patient called and states she developed a yeast infection from her recent antibiotic use from visit on 1/20.  Diflucan sent to pharmacy, per protocol.  Reviewed with patient.
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
## 2020-02-04 NOTE — Telephone Encounter (Signed)
Received TC from patient who states she recently had a sinus infection and was tx'd with ABX from urgent care.  Also states she went to her dentist, was told she needed 4 teeth extracted, but they needed to place her on ABX's first (amoxicillin).  She is now complaining of a bad yeast infection.  States she's had these in the past and usually takes diflucan, which she is requesting at this time.  Will forward request to red team. SChaplin, RN,BSN

## 2020-02-05 ENCOUNTER — Encounter: Payer: Medicaid Other | Admitting: Internal Medicine

## 2020-02-08 ENCOUNTER — Encounter: Payer: Self-pay | Admitting: Internal Medicine

## 2020-02-08 DIAGNOSIS — B373 Candidiasis of vulva and vagina: Secondary | ICD-10-CM | POA: Insufficient documentation

## 2020-02-08 DIAGNOSIS — B3731 Acute candidiasis of vulva and vagina: Secondary | ICD-10-CM | POA: Insufficient documentation

## 2020-02-08 NOTE — Assessment & Plan Note (Signed)
Patient presents for a telephone visit for vaginal yeast infection. Patient states that she has noticed increased peroneal burning and purities that is similar to her previous yeast infections. She denies dysuria, hematuria, or vaginal discharge.   She states that she was recently seen bu urgent care for a sinus infection where she was prescribed roughly 10-15 day coarse of amoxicillin. She is also taking an SGLT2 inhibitor which could increase her risk of vaginal yeast infections.   Therefore, I will prescribe her diflucan today for her infection.   **Addendum**  I call patient to let her know that we would be sending her a prescription for diflucan, but she states a prescription was already called in by urgent care.

## 2020-02-09 ENCOUNTER — Other Ambulatory Visit: Payer: Self-pay | Admitting: Internal Medicine

## 2020-02-10 NOTE — Progress Notes (Signed)
Internal Medicine Clinic Attending  Case discussed with Dr. Coe  At the time of the visit.  We reviewed the resident's history and pertinent patient test results.  I agree with the assessment, diagnosis, and plan of care documented in the resident's note.  

## 2020-02-17 ENCOUNTER — Other Ambulatory Visit: Payer: Self-pay

## 2020-02-18 ENCOUNTER — Encounter: Payer: Self-pay | Admitting: Internal Medicine

## 2020-02-18 ENCOUNTER — Ambulatory Visit: Payer: Medicaid Other | Admitting: Internal Medicine

## 2020-02-18 VITALS — BP 132/80 | HR 77 | Ht 66.0 in | Wt 288.0 lb

## 2020-02-18 DIAGNOSIS — Z794 Long term (current) use of insulin: Secondary | ICD-10-CM

## 2020-02-18 DIAGNOSIS — E1165 Type 2 diabetes mellitus with hyperglycemia: Secondary | ICD-10-CM | POA: Diagnosis not present

## 2020-02-18 DIAGNOSIS — E1142 Type 2 diabetes mellitus with diabetic polyneuropathy: Secondary | ICD-10-CM

## 2020-02-18 LAB — POCT GLYCOSYLATED HEMOGLOBIN (HGB A1C): Hemoglobin A1C: 8.8 % — AB (ref 4.0–5.6)

## 2020-02-18 MED ORDER — DEXCOM G6 SENSOR MISC: 1.0000 | 3 refills | Status: DC

## 2020-02-18 MED ORDER — DEXCOM G6 TRANSMITTER MISC: 1.0000 | 3 refills | Status: DC

## 2020-02-18 NOTE — Patient Instructions (Signed)
-   Continue Metformin 750 mg to 1 tablet TWICE a day  - Continue Lantus 50 units ONCE a day  - Increase  Novolog 14 units with each meal  - Novolog correctional insulin: ADD extra units on insulin to your meal-time Novolog dose if your blood sugars are higher than .170 Use the scale below to help guide you:   Blood sugar before meal Number of units to inject  Less than 170 0 unit  171 -  190 1 units  191 -  210 2 units  211 -  230 3 units  231 -  250 4 units  251 -  270 5 units  271 -  290 6 units  291 -  310 7 units  311 -  330 8 units          HOW TO TREAT LOW BLOOD SUGARS (Blood sugar LESS THAN 70 MG/DL)  Please follow the RULE OF 15 for the treatment of hypoglycemia treatment (when your (blood sugars are less than 70 mg/dL)    STEP 1: Take 15 grams of carbohydrates when your blood sugar is low, which includes:   3-4 GLUCOSE TABS  OR  3-4 OZ OF JUICE OR REGULAR SODA OR  ONE TUBE OF GLUCOSE GEL     STEP 2: RECHECK blood sugar in 15 MINUTES STEP 3: If your blood sugar is still low at the 15 minute recheck --> then, go back to STEP 1 and treat AGAIN with another 15 grams of carbohydrates.
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
## 2020-02-19 DIAGNOSIS — E1165 Type 2 diabetes mellitus with hyperglycemia: Secondary | ICD-10-CM | POA: Insufficient documentation

## 2020-02-23 ENCOUNTER — Other Ambulatory Visit: Payer: Self-pay

## 2020-02-23 ENCOUNTER — Encounter: Payer: Self-pay | Admitting: Dietician

## 2020-02-23 ENCOUNTER — Encounter: Payer: Medicaid Other | Attending: Gynecologic Oncology | Admitting: Dietician

## 2020-02-23 DIAGNOSIS — Z794 Long term (current) use of insulin: Secondary | ICD-10-CM | POA: Insufficient documentation

## 2020-02-23 DIAGNOSIS — E1165 Type 2 diabetes mellitus with hyperglycemia: Secondary | ICD-10-CM | POA: Diagnosis present

## 2020-02-23 NOTE — Patient Instructions (Signed)
  Patient Instructions  Plan:  Aim for 2-3 Carb Choices per meal (30-45 grams) +/- 1 either way  Aim for 0-1 Carbs per snack if hungry  Include protein in moderation with your meals and snacks Consider reading food labels for Total Carbohydrate of foods Consider  increasing your activity level by walking for 30 minutes daily as tolerated Continuechecking BG at alternate times per day  Continue taking medication as directed by MD  Walk most days.  Aim for 30 minutes daily. Stop smoking.  Great job for making the start! Drink beverages with no carbohydrates.  Water is a great choice. Put milk in your coffee rather than cream. Great job on the changes that you have made!

## 2020-02-26 NOTE — Progress Notes (Signed)
Diabetes Self-Management Education  Visit Type: Follow-up  Appt. Start Time: 1415 Appt. End Time: 0092  02/26/2020  [name], identified by name and date of birth, is a 55 y.o. female with a diagnosis of Diabetes:  .   ASSESSMENT  Patient is here today alone.  She was last seen by myself 12/23/2019.   Since last visit, patient reports increased stress and increased stress  eating.    Weight has increased 9 lbs.  A1C has decreased from 11.5% 10/2019 to 8.8% 02/18/2020.  Patient reports no concerns taking or affording her medications.  She has food stamps and uses the food bank at times.  She states that she plans to get nicotine patches to help her quit  smoking.  She states that both her and her boyfriend are going to work on  smoking cessation together.    She reports trying to avoid soda.  She has a dexcom prescription but has not gotten this yet.  History includes Type 2 diabetes since 2008, endometrial cancer (in remission per patient), OSA but does not like C-pap, HTN, HLD, depression, vitamin D deficiency, pancreatitis, fatty liver, GERD She smokes and has decreased from 2 packs to 4 cigarettes per day.  Medications include Lantus 50 units q am and pm, Novolog 14 units 15 minutes before each meal, metformin bid ("but I want to quit as I read this is not good for my Liver or Kidneys"), Jardiance, phentermine, 2000 units vitamin D daily A1C 8.8% 02/18/2020, 11.5% 10/22/2019 11.6% 09/30/19  Weight hx: 286.6 lbs 02/23/2020 276.7 lbs 12/23/2019 280 lbs 10/21/2019 286 lbs 09/23/2019 280 lbs 07/21/2019 347 lbs 05/2014 400 lbs highest weight and lost due to drug abuse  Body Composition Scale Sept 14, 2021 Dec 23, 2019 Feb 23 2019  Current Body Weight 286 lbs 276.7 lbs 286.6 lbs  Total Body Fat % 48.3 47.6 48.3  Visceral Fat 20 19 20   Fat-Free Mass % 51.6 52.3 51.6   Total Body Water % 40.3 40.6 40.3  Muscle-Mass lbs 32.1 32 32.1  BMI 46.2 44.6 46.2  Body Fat Displacement             Torso  lbs 85.9 81.7 86         Left Leg  lbs 17.1 16.3 17.2         Right Leg  lbs 17.1 16.3 17.2         Left Arm  lbs 8.5 8.1 8.6         Right Arm   lbs 8.5 8.1 8.6    Patient lives with her fiance.  He is currently taking chemo. She does the shopping and cooking. She feels isolated since covid. They avoid pork, chicken, and recently stopped chips and regular soda, uses some salt.  She reported drug use and food security issues 10 years ago. She is on disability.Gets SNAP. Used to do [employer] for exercise. Fiance is taking chemo for CLL and has increased pain and difficulty eating at times.  Last menstrual period 01/23/2014. There is no height or weight on file to calculate BMI.   Diabetes Self-Management Education - 02/26/20 1300      Visit Information   Visit Type Follow-up      Initial Visit   Are you currently following a meal plan? No    Are you taking your medications as prescribed? Yes      Psychosocial Assessment   Other persons present Patient    Patient Concerns Nutrition/Meal  planning;Weight Control;Glycemic Control      Pre-Education Assessment   Patient understands the diabetes disease and treatment process. Demonstrates understanding / competency    Patient understands incorporating nutritional management into lifestyle. Needs Review    Patient undertands incorporating physical activity into lifestyle. Demonstrates understanding / competency    Patient understands using medications safely. Demonstrates understanding / competency    Patient understands monitoring blood glucose, interpreting and using results Needs Review    Patient understands prevention, detection, and treatment of acute complications. Demonstrates understanding / competency    Patient understands prevention, detection, and treatment of chronic complications. Demonstrates understanding / competency    Patient understands how to develop strategies to address psychosocial  issues. Needs Review    Patient understands how to develop strategies to promote health/change behavior. Needs Review      Complications   Last HgB A1C per patient/outside source 8.8 %   02/18/2020     Dietary Intake   Breakfast 2 eggs, 1 slice dry [employer] toast OR 2 hotdogs and 1 slice [employer] toast   10   Snack (morning) occasional [employer] bread and cream cheese   7:30 am   Lunch soup and crackers or PB on [employer] bread, chips    Snack (afternoon) none    Dinner canned pork, rice, carrots and green beans    Snack (evening) occasional fruit or skinny popcorn    Beverage(s) water, diet soda, coffeee with cream      Exercise   Exercise Type Light (walking / raking leaves)      Patient Education   Nutrition management  Food label reading, portion sizes and measuring food.;Role of diet in the treatment of diabetes and the relationship between the three main macronutrients and blood glucose level;Meal options for control of blood glucose level and chronic complications.;Carbohydrate counting    Physical activity and exercise  Role of exercise on diabetes management, blood pressure control and cardiac health.    Medications Reviewed patients medication for diabetes, action, purpose, timing of dose and side effects.    Psychosocial adjustment Role of stress on diabetes;Identified and addressed patients feelings and concerns about diabetes    Personal strategies to promote health Lifestyle issues that need to be addressed for better diabetes care      Individualized Goals (developed by patient)   Nutrition General guidelines for healthy choices and portions discussed    Physical Activity Exercise 5-7 days per week;30 minutes per day    Medications take my medication as prescribed    Monitoring  test my blood glucose as discussed    Reducing Risk stop smoking;increase portions of healthy fats    Health Coping discuss diabetes with (comment)      Patient Self-Evaluation of Goals - Patient rates self as meeting  previously set goals (% of time)   Nutrition >75%    Physical Activity >75%    Medications >75%    Monitoring 50 - 75 %    Problem Solving >75%    Reducing Risk 50 - 75 %    Health Coping >75%      Post-Education Assessment   Patient understands the diabetes disease and treatment process. Demonstrates understanding / competency    Patient understands incorporating nutritional management into lifestyle. Needs Review    Patient undertands incorporating physical activity into lifestyle. Demonstrates understanding / competency    Patient understands using medications safely. Needs Review    Patient understands monitoring blood glucose, interpreting and using results Needs Review  Patient understands prevention, detection, and treatment of acute complications. Demonstrates understanding / competency    Patient understands prevention, detection, and treatment of chronic complications. Demonstrates understanding / competency    Patient understands how to develop strategies to address psychosocial issues. Needs Review    Patient understands how to develop strategies to promote health/change behavior. Needs Review      Outcomes   Expected Outcomes Demonstrated interest in learning. Expect positive outcomes    Future DMSE 2 months    Program Status Not Completed      Subsequent Visit   Since your last visit have you continued or begun to take your medications as prescribed? Yes    Since your last visit have you had your blood pressure checked? Yes    Since your last visit have you experienced any weight changes? Gain    Weight Loss (lbs) 9    Since your last visit, are you checking your blood glucose at least once a day? No           Individualized Plan for Diabetes Self-Management Training:   Learning Objective:  Patient will have a greater understanding of diabetes self-management. Patient education plan is to attend individual and/or group sessions per assessed needs and concerns.    Plan:   Patient Instructions    Patient Instructions  Plan:  Aim for 2-3 Carb Choices per meal (30-45 grams) +/- 1 either way  Aim for 0-1 Carbs per snack if hungry  Include protein in moderation with your meals and snacks Consider reading food labels for Total Carbohydrate of foods Consider  increasing your activity level by walking for 30 minutes daily as tolerated Continuechecking BG at alternate times per day  Continue taking medication as directed by MD  Walk most days.  Aim for 30 minutes daily. Stop smoking.  Great job for making the start! Drink beverages with no carbohydrates.  Water is a great choice. Put milk in your coffee rather than cream. Great job on the changes that you have made!      Expected Outcomes:  Demonstrated interest in learning. Expect positive outcomes  Education material provided: Food label handouts, Meal plan card and My Plate  If problems or questions, patient to contact team via:  Phone  Future DSME appointment: 2 months

## 2020-03-04 ENCOUNTER — Telehealth: Payer: Self-pay | Admitting: Internal Medicine

## 2020-03-04 ENCOUNTER — Telehealth: Payer: Self-pay | Admitting: Dietician

## 2020-03-04 MED ORDER — DEXCOM G6 RECEIVER DEVI: 0 refills | Status: DC

## 2020-03-04 MED ORDER — "SURE COMFORT INSULIN SYRINGE 31G X 5/16"" 1 ML MISC": 5 refills | Status: AC

## 2020-03-04 NOTE — Telephone Encounter (Signed)
Patient called for an appointment to start the Dexcom.  She has the sensor applicator and transmitter.   Her phone is not compatible. Sent message to her doctor's MA for a prescription for this. Patient is to call back for an appointment when she has the receiver.  Antonieta Iba, RD, LDN, CDCES

## 2020-03-04 NOTE — Telephone Encounter (Signed)
Sent refill as requested.

## 2020-03-04 NOTE — Addendum Note (Signed)
Addended by: Jacqualin Combes on: 03/04/2020 04:33 PM   Modules accepted: Orders

## 2020-03-04 NOTE — Telephone Encounter (Signed)
Patient called to request a new RX for the following:  MEDICATION: New RX for Sure Comfort 5/16" length by 31.25 mm needles  PHARMACY:   [facility] [place] Phone:  [phone]  Fax:  [phone]       HAS THE PATIENT CONTACTED [place]?  Yes-this is a new RX  IS THIS A 90 DAY SUPPLY :  Yes  IS PATIENT OUT OF MEDICATION: No  IF NOT; HOW MUCH IS LEFT: 3 needles left  LAST APPOINTMENT DATE: @2 /09/2020  NEXT APPOINTMENT DATE:@5 /11/2020  DO WE HAVE YOUR PERMISSION TO LEAVE A DETAILED MESSAGE?: Yes  OTHER COMMENTS:    **Let patient know to contact pharmacy at the end of the day to make sure medication is ready. **  ** Please notify patient to allow 48-72 hours to process**  **Encourage patient to contact the pharmacy for refills or they can request refills through [facility]**

## 2020-03-11 ENCOUNTER — Telehealth: Payer: Self-pay | Admitting: Internal Medicine

## 2020-03-11 NOTE — Telephone Encounter (Signed)
Pt called stating her pharmacy received the prescription for her Dexcom G6 however it needs a PA. Please sent to:  [facility] [phone] - [place], [address] AT [place] Phone:  [phone]  Fax:  [phone]

## 2020-03-11 NOTE — Telephone Encounter (Signed)
Pt called back stating she actually needs the Dexcom G6 reciever PA, not just Dexcom G6

## 2020-03-11 NOTE — Telephone Encounter (Signed)
Noted. I was already made aware of this from Mickel Baas that patient need a receiver since not compatible to her phone. PA have been submitted and waiting on response.

## 2020-03-16 ENCOUNTER — Ambulatory Visit: Payer: Medicaid Other | Admitting: Podiatry

## 2020-03-16 ENCOUNTER — Encounter: Payer: Self-pay | Admitting: Podiatry

## 2020-03-16 ENCOUNTER — Other Ambulatory Visit: Payer: Self-pay

## 2020-03-16 DIAGNOSIS — M79675 Pain in left toe(s): Secondary | ICD-10-CM

## 2020-03-16 DIAGNOSIS — B351 Tinea unguium: Secondary | ICD-10-CM

## 2020-03-16 DIAGNOSIS — E1149 Type 2 diabetes mellitus with other diabetic neurological complication: Secondary | ICD-10-CM | POA: Diagnosis not present

## 2020-03-16 DIAGNOSIS — M79674 Pain in right toe(s): Secondary | ICD-10-CM | POA: Diagnosis not present

## 2020-03-16 NOTE — Progress Notes (Addendum)
Complaint:  Visit Type: Patient returns to my office for continued preventative foot care services. Complaint: Patient states" my nails have grown long and thick and become painful to walk and wear shoes" Patient has been diagnosed with DM with neuropathy. The patient presents for preventative foot care services.  Podiatric Exam: Vascular: dorsalis pedis and posterior tibial pulses are palpable bilateral. Capillary return is immediate. Temperature gradient is WNL. Skin turgor WNL  Sensorium: Diminished  Semmes Weinstein monofilament test. Normal tactile sensation bilaterally. Nail Exam: Pt has thick disfigured discolored nails with subungual debris noted bilateral entire nail hallux through fifth toenails Ulcer Exam: There is no evidence of ulcer or pre-ulcerative changes or infection. Orthopedic Exam: Muscle tone and strength are WNL. No limitations in general ROM. No crepitus or effusions noted. Foot type and digits show no abnormalities. HAV  B/L. Skin: No Porokeratosis. No infection or ulcers  Diagnosis:  Onychomycosis, , Pain in right toe, pain in left toes  Treatment & Plan Procedures and Treatment: Consent by patient was obtained for treatment procedures.   Debridement of mycotic and hypertrophic toenails, 1 through 5 bilateral and clearing of subungual debris. No ulceration, no infection noted.  Return Visit-Office Procedure: Patient instructed to return to the office for a follow up visit 3 months for continued evaluation and treatment.    Khristie Sak DPM 

## 2020-03-18 NOTE — Telephone Encounter (Signed)
Spoken to patient and inform her that I am not sure about the letter, she can bring the letter and we can look at it for her. There is nothing in her records.

## 2020-03-18 NOTE — Telephone Encounter (Signed)
I called patient.  Confirmed on line that her phone is not compatable.  Called [employer] who also stated that even though she can get the [facility] app, her phone is not compatible.  Her phone would not update properly. She needs the receiver for the Dexom to work.  Please resubmit this for prior authorization.  They approved the Dexcom so expect they should approve the receiver.  Make sure that a box is not checked that states that she has a phone.  Be sure that she has a MD order for the receiver.  Please let me know if I can assist further.  Antonieta Iba, RD, LDN, CDCES

## 2020-03-18 NOTE — Telephone Encounter (Signed)
Pt states she received a letter in the mail from [facility] stating on 3/4 pt requested pharmacy services, however she states she did not reach out for any services and is very confused. Pt was wondering if this has to do with her Dexcom G6. Pt requests a nurse give her a call to follow up on this at [phone].

## 2020-03-18 NOTE — Telephone Encounter (Signed)
Prior Auth for the Dexcom G6 receiver was denied by medicaid.   Mickel Baas,   Is there anything else we do for patient? I have faxed the auth and sent through CoverMyMeds. Patient asked what kind of phone does she need.

## 2020-03-22 NOTE — Telephone Encounter (Signed)
I have called Tara Keller. I was told to re-fax the PA form and stating that patient just needs receiver because phone.

## 2020-03-23 ENCOUNTER — Other Ambulatory Visit: Payer: Self-pay

## 2020-03-23 ENCOUNTER — Encounter: Payer: Medicaid Other | Admitting: Internal Medicine

## 2020-03-23 ENCOUNTER — Ambulatory Visit: Payer: Medicaid Other | Admitting: Internal Medicine

## 2020-03-23 ENCOUNTER — Encounter: Payer: Self-pay | Admitting: Internal Medicine

## 2020-03-23 VITALS — BP 141/88 | HR 79 | Temp 98.3°F | Ht 66.0 in | Wt 283.6 lb

## 2020-03-23 DIAGNOSIS — M79604 Pain in right leg: Secondary | ICD-10-CM | POA: Diagnosis not present

## 2020-03-23 DIAGNOSIS — M7071 Other bursitis of hip, right hip: Secondary | ICD-10-CM

## 2020-03-23 DIAGNOSIS — M1990 Unspecified osteoarthritis, unspecified site: Secondary | ICD-10-CM

## 2020-03-23 DIAGNOSIS — E1169 Type 2 diabetes mellitus with other specified complication: Secondary | ICD-10-CM

## 2020-03-23 DIAGNOSIS — L299 Pruritus, unspecified: Secondary | ICD-10-CM | POA: Diagnosis not present

## 2020-03-23 DIAGNOSIS — F4321 Adjustment disorder with depressed mood: Secondary | ICD-10-CM | POA: Insufficient documentation

## 2020-03-23 DIAGNOSIS — M25551 Pain in right hip: Secondary | ICD-10-CM | POA: Insufficient documentation

## 2020-03-23 DIAGNOSIS — F17200 Nicotine dependence, unspecified, uncomplicated: Secondary | ICD-10-CM | POA: Diagnosis not present

## 2020-03-23 DIAGNOSIS — Z794 Long term (current) use of insulin: Secondary | ICD-10-CM | POA: Diagnosis not present

## 2020-03-23 DIAGNOSIS — R1013 Epigastric pain: Secondary | ICD-10-CM

## 2020-03-23 DIAGNOSIS — R109 Unspecified abdominal pain: Secondary | ICD-10-CM | POA: Insufficient documentation

## 2020-03-23 DIAGNOSIS — Z0000 Encounter for general adult medical examination without abnormal findings: Secondary | ICD-10-CM

## 2020-03-23 DIAGNOSIS — E1142 Type 2 diabetes mellitus with diabetic polyneuropathy: Secondary | ICD-10-CM | POA: Diagnosis not present

## 2020-03-23 DIAGNOSIS — M1610 Unilateral primary osteoarthritis, unspecified hip: Secondary | ICD-10-CM

## 2020-03-23 HISTORY — DX: Adjustment disorder with depressed mood: F43.21

## 2020-03-23 LAB — CBC WITH DIFFERENTIAL/PLATELET
Abs Immature Granulocytes: 0.04 10*3/uL (ref 0.00–0.07)
Basophils Absolute: 0.1 10*3/uL (ref 0.0–0.1)
Basophils Relative: 1 %
Eosinophils Absolute: 0.3 10*3/uL (ref 0.0–0.5)
Eosinophils Relative: 3 %
HCT: 43.1 % (ref 36.0–46.0)
Hemoglobin: 14.1 g/dL (ref 12.0–15.0)
Immature Granulocytes: 1 %
Lymphocytes Relative: 38 %
Lymphs Abs: 3.3 10*3/uL (ref 0.7–4.0)
MCH: 28.3 pg (ref 26.0–34.0)
MCHC: 32.7 g/dL (ref 30.0–36.0)
MCV: 86.4 fL (ref 80.0–100.0)
Monocytes Absolute: 0.7 10*3/uL (ref 0.1–1.0)
Monocytes Relative: 8 %
Neutro Abs: 4.3 10*3/uL (ref 1.7–7.7)
Neutrophils Relative %: 49 %
Platelets: 312 10*3/uL (ref 150–400)
RBC: 4.99 MIL/uL (ref 3.87–5.11)
RDW: 13.1 % (ref 11.5–15.5)
WBC: 8.7 10*3/uL (ref 4.0–10.5)
nRBC: 0 % (ref 0.0–0.2)

## 2020-03-23 LAB — COMPREHENSIVE METABOLIC PANEL
ALT: 24 U/L (ref 0–44)
AST: 15 U/L (ref 15–41)
Albumin: 3.6 g/dL (ref 3.5–5.0)
Alkaline Phosphatase: 168 U/L — ABNORMAL HIGH (ref 38–126)
Anion gap: 9 (ref 5–15)
BUN: 6 mg/dL (ref 6–20)
CO2: 25 mmol/L (ref 22–32)
Calcium: 9.6 mg/dL (ref 8.9–10.3)
Chloride: 100 mmol/L (ref 98–111)
Creatinine, Ser: 0.75 mg/dL (ref 0.44–1.00)
GFR, Estimated: >60 mL/min (ref >60)
Glucose, Bld: 236 mg/dL — ABNORMAL HIGH (ref 70–99)
Potassium: 3.9 mmol/L (ref 3.5–5.1)
Sodium: 134 mmol/L — ABNORMAL LOW (ref 135–145)
Total Bilirubin: 0.7 mg/dL (ref 0.3–1.2)
Total Protein: 7.6 g/dL (ref 6.5–8.1)

## 2020-03-23 LAB — LIPASE, BLOOD: Lipase: 32 U/L (ref 11–51)

## 2020-03-23 MED ORDER — LISINOPRIL 10 MG PO TABS: 10.0000 mg | ORAL_TABLET | Freq: Every day | ORAL | 2 refills | Status: DC

## 2020-03-23 MED ORDER — NICOTINE 14 MG/24HR TD PT24: 14.0000 mg | MEDICATED_PATCH | TRANSDERMAL | 2 refills | Status: AC

## 2020-03-23 MED ORDER — TRAMADOL HCL 50 MG PO TABS: 50.0000 mg | ORAL_TABLET | Freq: Four times a day (QID) | ORAL | 0 refills | Status: AC | PRN

## 2020-03-23 MED ORDER — SUCRALFATE 1 G PO TABS: ORAL_TABLET | ORAL | 1 refills | Status: DC

## 2020-03-23 NOTE — Assessment & Plan Note (Signed)
-   discuss needed mammogram at f/u in one week

## 2020-03-23 NOTE — Patient Instructions (Signed)
Thank you for allowing [name] to provide your care today. Today we discussed your recent loss, your right hip pain, and your abdominal pain.     I have ordered the following labs for you:    I will call if any are abnormal.    Today we made the following changes to your medications:   Please START taking  Carafate - take 1 tablet before each meal and at bedtime   Tramadol 50 mg - take 1 tablet every 6 hours as needed for abdominal pain and hip pain  Nicotine patch - use one patch every 24 hours.    Please call [facility] at [phone]  Please follow-up in one week.    Please call the internal medicine center clinic if you have any questions or concerns, we may be able to help and keep you from a long and expensive emergency room wait. Our clinic and after hours phone number is [phone], the best time to call is Monday through Friday 9 am to 4 pm but there is always someone available 24/7 if you have an emergency. If you need medication refills please notify your pharmacy one week in advance and they will send [name] a request.
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
## 2020-03-23 NOTE — Assessment & Plan Note (Addendum)
She has chronic right leg pain that is from her lateral hip to above her knee that has been present since she fell last year. Tylenol and ibuprofen sometimes help the pain. XR was done 10/2019 which showed mild joint narrowing of the right hip joint. On physical exam she is TTP over entire iliopsoas. Pain with passive leg adduction down the lateral leg and with leg flexion. She has antalgic gait.  Symptoms likely multifactorial with tight ileopsoas, arthritis, worsened with morbid obesity.   - referral placed to PT - [place] ordered  - continue tylenol, alternate ice and heat  - tramadol 50 mg q6h prn for 7 days - she is in distress today due to the death of her fiancee's grandson, will discuss lifestyle modifications at f/u in one week
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
## 2020-03-23 NOTE — Assessment & Plan Note (Signed)
Back itching and burning that has been going on for months. She was given some cream during her last visit, clotrimazole, which did not help. She does not remember being on atarax, which has expired. On PE there is not rash or lesions noted.   - stop clotrimazole - sarna cream  - f/u in one week

## 2020-03-23 NOTE — Assessment & Plan Note (Addendum)
She has been having a lot of central abdominal pain the last week, similar to her prior pancreatitis although not as severe. The pain is a sharp, cramping pain across her abdomen and is worse when she eats. No nausea or vomiting. She has been having constipation as well. Stools are usually brown but are black every now and then. She has tried tylenol and ibuprofen but they have not helped.  Vitals stable. Abdomen TTP across epigastric, superior umbilical region, normal BS, no icterus. She has follow-up scheduled with GI later this month. She has a history of mild acute pancreatitis requiring admission in 2020 with recurrent abdominal pain since then with mildly elevated lipase. EGD was done June 2021 and showed small hiatal hernia and Barret's Esophagus. She is on protonix 40 mg bid but has not been taking carafate.  Differential includes pancreatitis flare, gastritis, PUD, and gastroparesis with chronic diabetes.   - stat lipase, CBC with diff, CMP  - restart carafate  - tramadol 50 mg q6h prn for 7 days - f/u in one week or sooner if symptoms do not improve or worsen   ADDENDUM: lipase normal, chronically elevated alk phos improved from prior, CBC normal. Discussed with patient and given return precautions.

## 2020-03-23 NOTE — Assessment & Plan Note (Signed)
She would like to start nicotine patches again. She stopped smoking previously but started again due to her daughter stressing her out. She smokes about 5 cigarettes per day.   - nicotine patches ordered

## 2020-03-24 NOTE — Progress Notes (Signed)
Internal Medicine Clinic Attending  Case discussed with Dr. Seawell  At the time of the visit.  We reviewed the resident's history and exam and pertinent patient test results.  I agree with the assessment, diagnosis, and plan of care documented in the resident's note.  

## 2020-04-01 ENCOUNTER — Other Ambulatory Visit: Payer: Self-pay

## 2020-04-01 ENCOUNTER — Ambulatory Visit: Payer: Medicaid Other | Attending: Internal Medicine

## 2020-04-01 DIAGNOSIS — R2681 Unsteadiness on feet: Secondary | ICD-10-CM | POA: Diagnosis present

## 2020-04-01 DIAGNOSIS — M545 Low back pain, unspecified: Secondary | ICD-10-CM | POA: Diagnosis present

## 2020-04-01 DIAGNOSIS — R2689 Other abnormalities of gait and mobility: Secondary | ICD-10-CM | POA: Insufficient documentation

## 2020-04-01 DIAGNOSIS — M6281 Muscle weakness (generalized): Secondary | ICD-10-CM | POA: Insufficient documentation

## 2020-04-01 DIAGNOSIS — M79651 Pain in right thigh: Secondary | ICD-10-CM | POA: Diagnosis present

## 2020-04-01 DIAGNOSIS — M25551 Pain in right hip: Secondary | ICD-10-CM | POA: Insufficient documentation

## 2020-04-01 DIAGNOSIS — M25651 Stiffness of right hip, not elsewhere classified: Secondary | ICD-10-CM | POA: Diagnosis present

## 2020-04-01 DIAGNOSIS — G8929 Other chronic pain: Secondary | ICD-10-CM | POA: Diagnosis present

## 2020-04-01 NOTE — Patient Instructions (Signed)
Access Code: [email or web address] URL: [email or web address] Date: 04/01/2020 Prepared by: Sherlynn Stalls  Program Notes Can use a rolling pin to roll and put pressure on areas of pain/muscle spasm   Exercises Seated Long Arc Quad - 1 x daily - 7 x weekly - 3 sets - 5 reps Seated Hip Abduction - 1 x daily - 7 x weekly - 4 sets - 5 reps Supine Hip Internal and External Rotation - 1 x daily - 7 x weekly - 4 sets - 5 reps Modified Thomas Stretch - 1 x daily - 7 x weekly - 4 sets - 30 seconds hold
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
## 2020-04-02 ENCOUNTER — Encounter: Payer: Medicaid Other | Admitting: Dietician

## 2020-04-06 ENCOUNTER — Ambulatory Visit: Payer: Medicaid Other | Admitting: Internal Medicine

## 2020-04-06 VITALS — BP 128/79 | HR 65 | Temp 97.9°F | Wt 284.8 lb

## 2020-04-06 DIAGNOSIS — R748 Abnormal levels of other serum enzymes: Secondary | ICD-10-CM | POA: Diagnosis not present

## 2020-04-06 DIAGNOSIS — E1142 Type 2 diabetes mellitus with diabetic polyneuropathy: Secondary | ICD-10-CM

## 2020-04-06 DIAGNOSIS — Z794 Long term (current) use of insulin: Secondary | ICD-10-CM | POA: Diagnosis not present

## 2020-04-06 DIAGNOSIS — E559 Vitamin D deficiency, unspecified: Secondary | ICD-10-CM

## 2020-04-06 DIAGNOSIS — E1169 Type 2 diabetes mellitus with other specified complication: Secondary | ICD-10-CM | POA: Diagnosis not present

## 2020-04-06 DIAGNOSIS — R202 Paresthesia of skin: Secondary | ICD-10-CM

## 2020-04-06 DIAGNOSIS — K760 Fatty (change of) liver, not elsewhere classified: Secondary | ICD-10-CM

## 2020-04-06 DIAGNOSIS — L299 Pruritus, unspecified: Secondary | ICD-10-CM

## 2020-04-06 DIAGNOSIS — M79604 Pain in right leg: Secondary | ICD-10-CM

## 2020-04-06 MED ORDER — ATORVASTATIN CALCIUM 20 MG PO TABS: 20.0000 mg | ORAL_TABLET | Freq: Every day | ORAL | 1 refills | Status: DC

## 2020-04-06 MED ORDER — GABAPENTIN 100 MG PO CAPS: 200.0000 mg | ORAL_CAPSULE | Freq: Three times a day (TID) | ORAL | 0 refills | Status: DC

## 2020-04-06 NOTE — Patient Instructions (Signed)
Thank you for allowing [name] to provide your care today.   I have ordered the following labs for you:    I will call if any are abnormal.    Today we made the following changes to your medications:   Please START taking  Gabapentin 100 mg - take 1 tablet three times per day for 7 days  If no improvement in 7 days, increase to 2 tablets three times per day.    Please follow-up in two months for hemoglobin a1c check.    Please call the internal medicine center clinic if you have any questions or concerns, we may be able to help and keep you from a long and expensive emergency room wait. Our clinic and after hours phone number is [phone], the best time to call is Monday through Friday 9 am to 4 pm but there is always someone available 24/7 if you have an emergency. If you need medication refills please notify your pharmacy one week in advance and they will send [name] a request.

## 2020-04-06 NOTE — Assessment & Plan Note (Signed)
She continues to have itching of her right upper/mid back that wraps around to under her breast. This has been present for the past six months. She has tried clotrimazole, lotions, and sarna lotion, none of which has helped. She describes the itching as almost a burning and is wondering if she has shingles.  There is still no rash or skin findings present. 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

## 2020-04-06 NOTE — Addendum Note (Signed)
Addended by: Molli Hazard A on: 04/06/2020 03:51 PM   Modules accepted: Orders

## 2020-04-06 NOTE — Assessment & Plan Note (Signed)
History of vitamin D deficiency. She is currently taking 2,000 U per day. She also has chronically elevated alk phos. This is thought to potentially be related to her history of pancreatitis. She also has steatosis.   - recheck vitamin D  - check GGT
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
## 2020-04-06 NOTE — Assessment & Plan Note (Signed)
She is working with PT and they have given her exercises and other advice for her chronic pain, both of which have helped.   - cont. PT - cane not yet delivered  - continue tylenol, alternating ice and heat

## 2020-04-07 ENCOUNTER — Ambulatory Visit: Payer: Medicaid Other | Admitting: Nurse Practitioner

## 2020-04-07 ENCOUNTER — Other Ambulatory Visit: Payer: Self-pay

## 2020-04-07 ENCOUNTER — Encounter: Payer: Self-pay | Admitting: Nurse Practitioner

## 2020-04-07 VITALS — BP 110/78 | HR 74 | Ht 66.0 in | Wt 282.0 lb

## 2020-04-07 DIAGNOSIS — R1010 Upper abdominal pain, unspecified: Secondary | ICD-10-CM

## 2020-04-07 DIAGNOSIS — K2100 Gastro-esophageal reflux disease with esophagitis, without bleeding: Secondary | ICD-10-CM | POA: Diagnosis not present

## 2020-04-07 DIAGNOSIS — R112 Nausea with vomiting, unspecified: Secondary | ICD-10-CM | POA: Diagnosis not present

## 2020-04-07 LAB — VITAMIN B12: Vitamin B-12: 691 pg/mL (ref 232–1245)

## 2020-04-07 LAB — VITAMIN D 25 HYDROXY (VIT D DEFICIENCY, FRACTURES): Vit D, 25-Hydroxy: 41.2 ng/mL (ref 30.0–100.0)

## 2020-04-07 LAB — GAMMA GT: GGT: 248 IU/L — ABNORMAL HIGH (ref 0–60)

## 2020-04-07 LAB — TSH: TSH: 1.43 u[IU]/mL (ref 0.450–4.500)

## 2020-04-07 MED ORDER — ONDANSETRON 4 MG PO TBDP: ORAL_TABLET | ORAL | 3 refills | Status: DC
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
## 2020-04-07 NOTE — Assessment & Plan Note (Addendum)
ADDENDUM:  GGT elevated in addition to chronically elevated alk phos, which has been up since questionable mild pancreatitis in 2015 (no labs prior to this). See Overview for additional history. She has been having intermittent abdominal pain since then but all work-up for recurrence of pancreatitis has been normal. Last CT abdomen 04/2019 showed steatosis and hepatomegaly without focal liver injury, s/p cholecystectomy, no biliary dilation.  She'd been having abdominal pain for the past two weeks similar to prior pancreatitis pain but lipase was normal.  She followed up with GI 3/30, and they will plan to monitor as pain is improving and repeat CT scans in the past have not shown new findings except for possible mild pancreatitis in 2020. Her LFTs have been normal since liver injury in 2015.  She will follow-up with them again in 3-4 weeks.

## 2020-04-07 NOTE — Patient Instructions (Addendum)
If you are age 57 or older, your body mass index should be between 23-30. Your Body mass index is 45.52 kg/m. If this is out of the aforementioned range listed, please consider follow up with your Primary Care Provider.  If you are age 23 or younger, your body mass index should be between 19-25. Your Body mass index is 45.52 kg/m. If this is out of the aformentioned range listed, please consider follow up with your Primary Care Provider.   Increase the Carafate to 4 x a day. One with meals and one at bedtime, do this for 1 month.  Stop NSAID use   It was a pleasure to see you today!  Tara Keller

## 2020-04-10 NOTE — Progress Notes (Signed)
Internal Medicine Clinic Attending  Case discussed with Dr. Seawell  At the time of the visit.  We reviewed the resident's history and exam and pertinent patient test results.  I agree with the assessment, diagnosis, and plan of care documented in the resident's note.  

## 2020-04-13 IMAGING — CT CT ABD-PELV W/ CM
2 of 5 series · 16 of 46 positions shown, 18 images · IV contrast (omnipaque)
Comparison: 11/04/2018

CLINICAL DATA: Pancreatitis

EXAM:
CT ABDOMEN AND PELVIS WITH CONTRAST
TECHNIQUE: Multidetector CT imaging of the abdomen and pelvis was performed
using the standard protocol following bolus administration of
intravenous contrast.
CONTRAST:  100mL OMNIPAQUE IOHEXOL 300 MG/ML SOLN, 30mL OMNIPAQUE
IOHEXOL 300 MG/ML SOLN

[Series 2: axial st · axial · 0.76mm/px · z∈[+1050,+1475]mm · 13 of 97 slices shown, 15 images]
[im 6/97  soft-tissue]
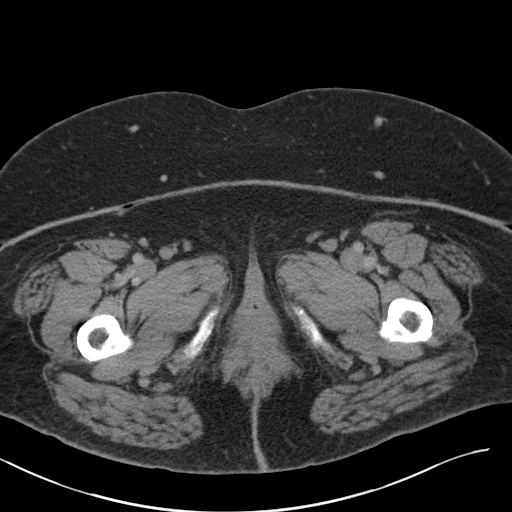
[im 6/97  bone]
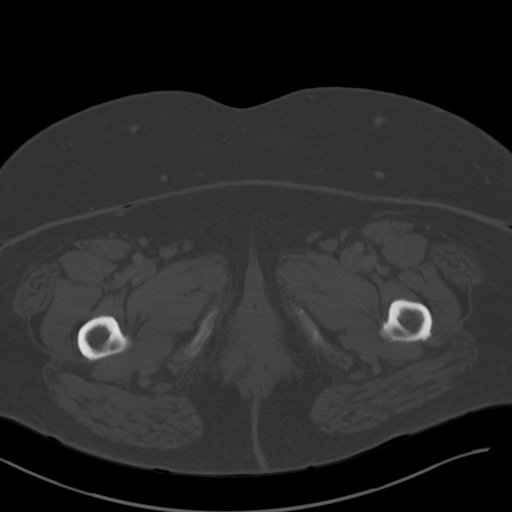
[im 12/97  soft-tissue]
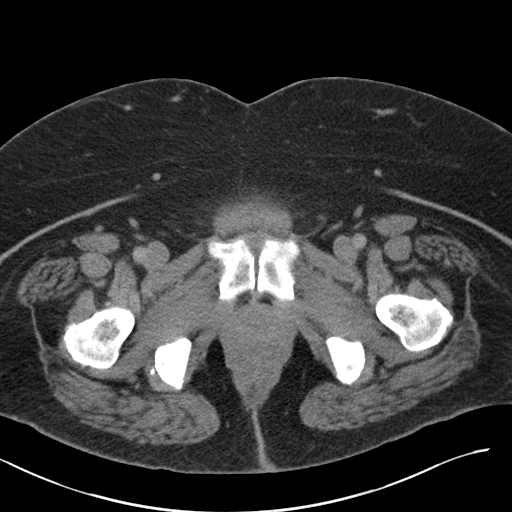
[im 23/97  soft-tissue]
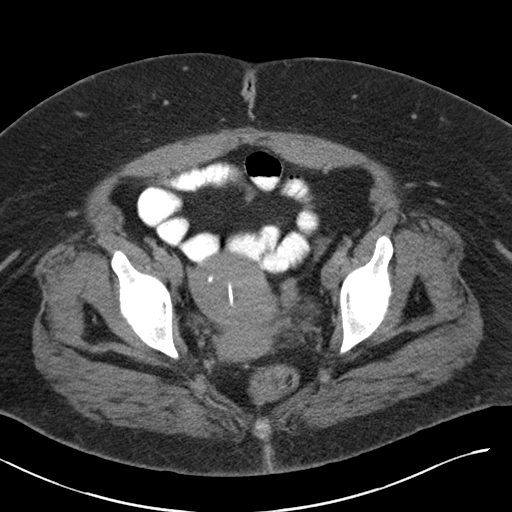
[im 29/97  soft-tissue]
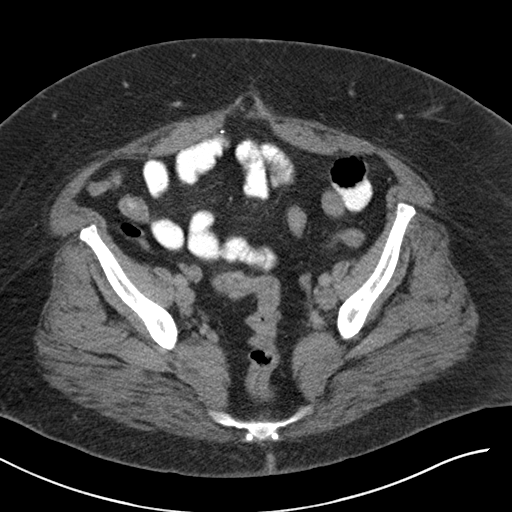
[im 34/97  soft-tissue]
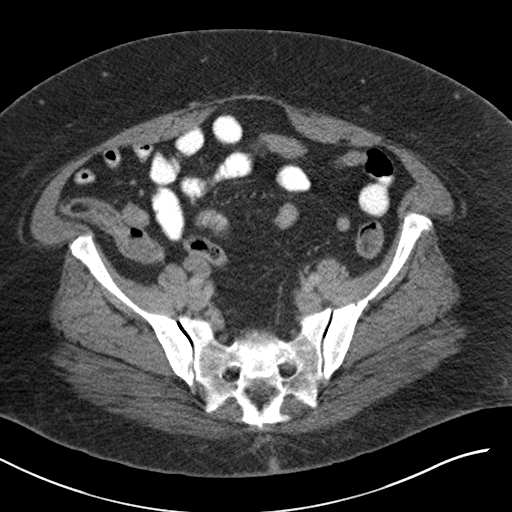
[im 40/97  soft-tissue]
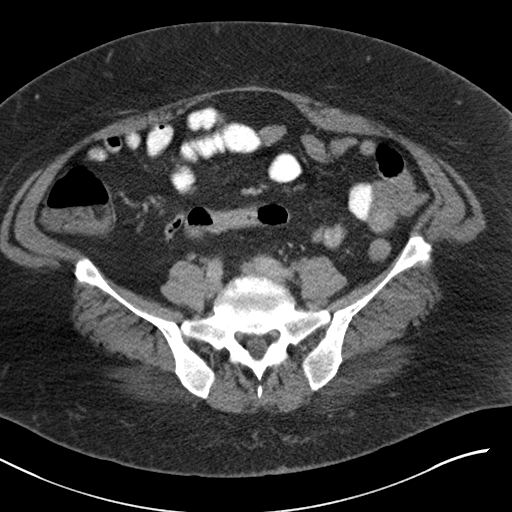
[im 51/97  soft-tissue]
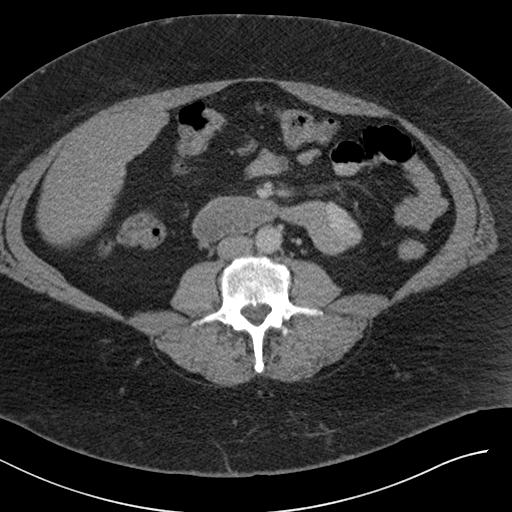
[im 57/97  soft-tissue]
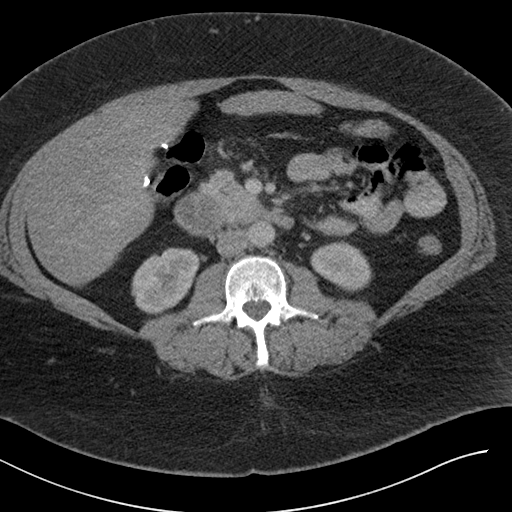
[im 63/97  soft-tissue]
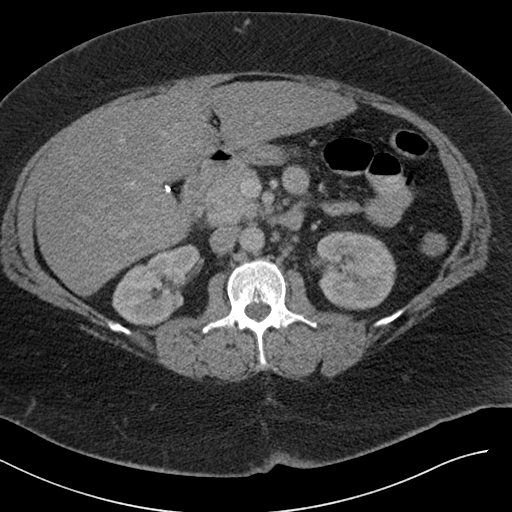
[im 63/97  bone]
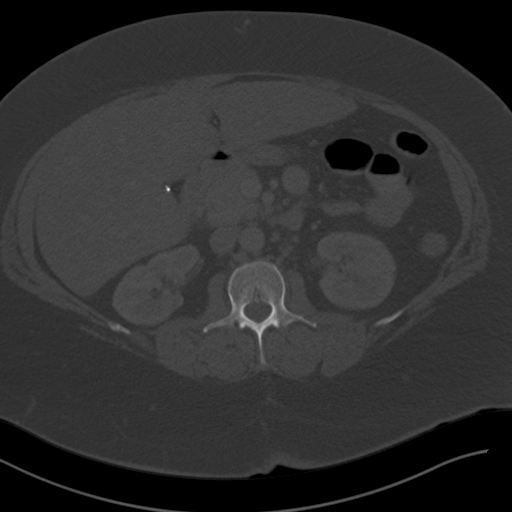
[im 68/97  soft-tissue]
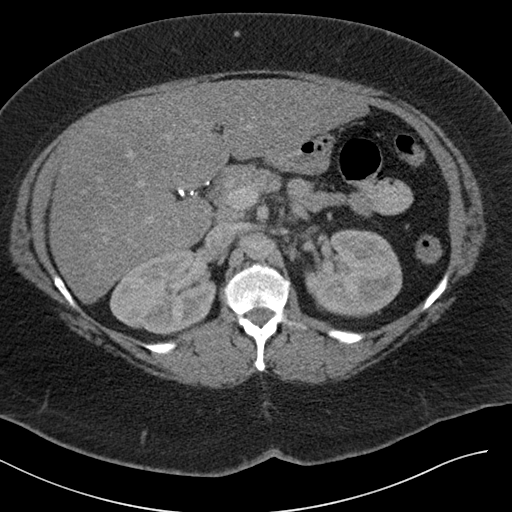
[im 74/97  soft-tissue]
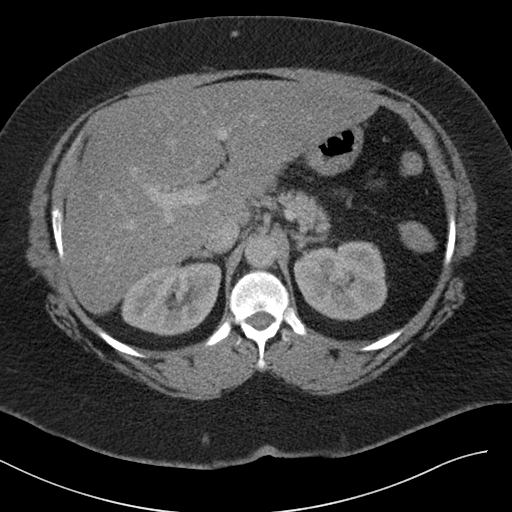
[im 85/97  soft-tissue]
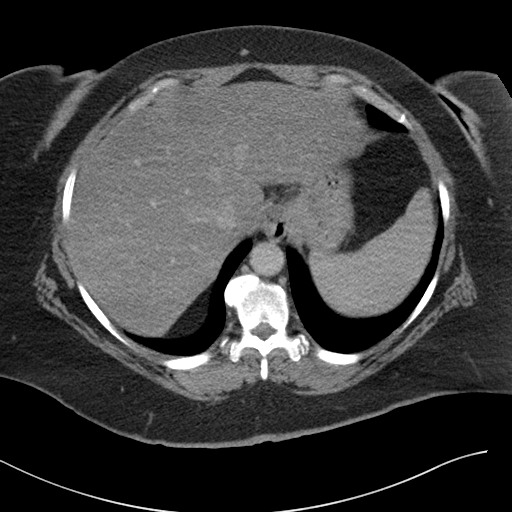
[im 91/97  soft-tissue]
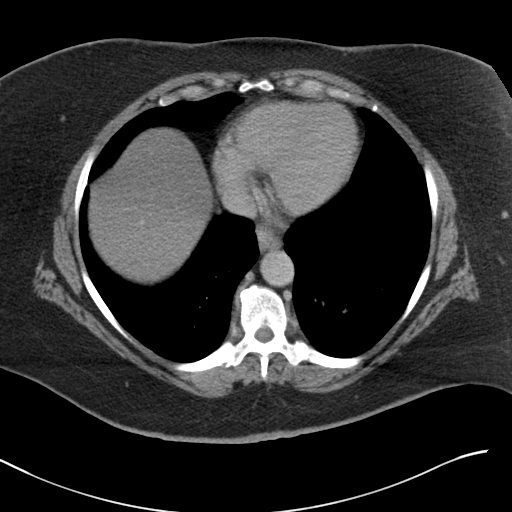

[Series 5: coronal st · coronal · 0.88mm/px · 3 of 163 slices shown]
[im 55/163  soft-tissue]
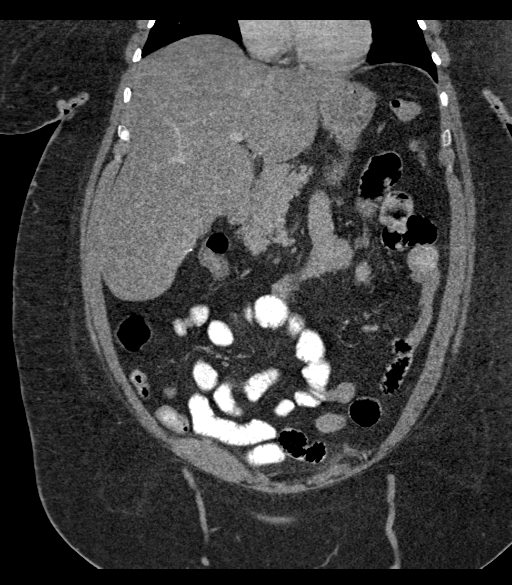
[im 73/163  soft-tissue]
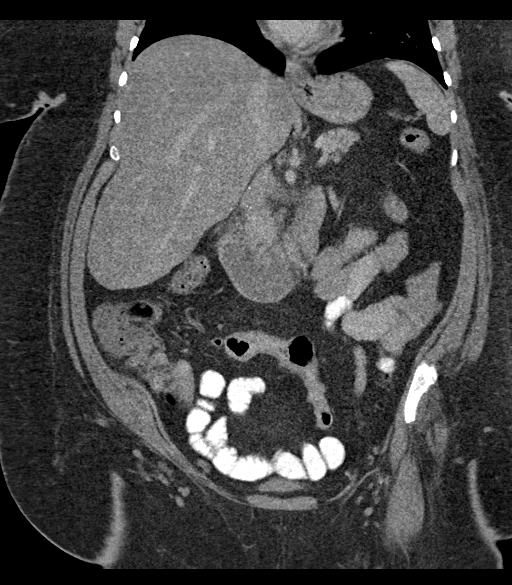
[im 91/163  soft-tissue]
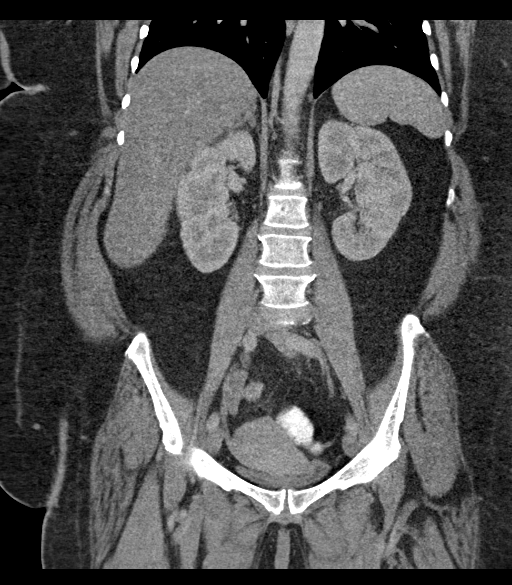

[16 of 46 positions shown; findings below may reference images not displayed]

FINDINGS: Lower chest: The lung bases are clear. The heart size is normal.

Hepatobiliary: There is decreased hepatic attenuation suggestive of
hepatic steatosis. Status post cholecystectomy.There is no biliary
ductal dilation.

Pancreas: There is some mild fat stranding about the pancreatic
head, improved from prior study. No pancreatic pseudocyst. No
drainable collection.

Spleen: No splenic laceration or hematoma.

Adrenals/Urinary Tract:

--Adrenal glands: No adrenal hemorrhage.

--Right kidney/ureter: No hydronephrosis or perinephric hematoma.

--Left kidney/ureter: No hydronephrosis or perinephric hematoma.

--Urinary bladder: Unremarkable.

Stomach/Bowel:

--Stomach/Duodenum: No hiatal hernia or other gastric abnormality.
Normal duodenal course and caliber.

--Small bowel: No dilatation or inflammation.

--Colon: No focal abnormality.

--Appendix: Normal.

Vascular/Lymphatic: Normal course and caliber of the major abdominal
vessels.

--No retroperitoneal lymphadenopathy.

--No mesenteric lymphadenopathy.

--No pelvic or inguinal lymphadenopathy.

Reproductive: There is an IUD in place.

Other: No ascites or free air. The abdominal wall is normal.

Musculoskeletal. No acute displaced fractures.
IMPRESSION: 1. Mild fat stranding about the pancreatic head, improved from prior
study. No evidence for pancreatic necrosis. No pseudocyst.
2. Hepatic steatosis.
3. Status post cholecystectomy.
4. Normal appendix in the right lower quadrant.

## 2020-04-14 ENCOUNTER — Ambulatory Visit: Payer: Medicaid Other | Admitting: Physical Therapy

## 2020-04-14 ENCOUNTER — Telehealth: Payer: Self-pay | Admitting: Internal Medicine

## 2020-04-14 NOTE — Telephone Encounter (Signed)
Please relay that info to the pt. There's not much we can do.

## 2020-04-14 NOTE — Telephone Encounter (Signed)
New Message: Pt calling regarding an update on her dexcom and what the next steps are.

## 2020-04-14 NOTE — Telephone Encounter (Signed)
Please advise. I just received in the mail that medicaid will not cover the receiver. I have sent an appeal but it was denied. Her phone is not compliable with the app.

## 2020-04-15 NOTE — Telephone Encounter (Signed)
Spoken to patient and notified Dr Quin Hoop comments. Verbalized understanding.   Tara Keller,   Patient stated that she have 2 phones and wondering if someone can check if she can download the Dexcom app at either phone. Can you help patient with this?

## 2020-04-15 NOTE — Telephone Encounter (Signed)
I called patient and neither phone is compatible.  If she wishes to purchase a new compatible phone I have directed her to the dexcom phone compatibility website to see if her desired phone is on the list.  Mickel Baas

## 2020-04-15 NOTE — Telephone Encounter (Signed)
Noted  

## 2020-04-20 NOTE — Telephone Encounter (Signed)
Pt calling to speak to nurse

## 2020-04-21 NOTE — Telephone Encounter (Signed)
Spoken to patient and she stated that she cannot afford the cost for a new phone. She stated is possible to call Medicaid [place] again. I have inform patient that I will try

## 2020-04-27 NOTE — Progress Notes (Signed)
Addendum: Reviewed and agree with assessment and management plan. Juna Caban M, MD  

## 2020-04-28 ENCOUNTER — Other Ambulatory Visit: Payer: Self-pay | Admitting: Student

## 2020-05-14 ENCOUNTER — Other Ambulatory Visit: Payer: Self-pay | Admitting: Internal Medicine

## 2020-05-14 DIAGNOSIS — E1142 Type 2 diabetes mellitus with diabetic polyneuropathy: Secondary | ICD-10-CM

## 2020-05-14 DIAGNOSIS — Z794 Long term (current) use of insulin: Secondary | ICD-10-CM

## 2020-05-19 ENCOUNTER — Ambulatory Visit: Payer: Medicaid Other | Admitting: Internal Medicine

## 2020-05-19 ENCOUNTER — Other Ambulatory Visit: Payer: Self-pay

## 2020-05-19 ENCOUNTER — Encounter: Payer: Self-pay | Admitting: Internal Medicine

## 2020-05-19 VITALS — BP 124/80 | HR 78 | Ht 66.0 in | Wt 278.4 lb

## 2020-05-19 DIAGNOSIS — Z794 Long term (current) use of insulin: Secondary | ICD-10-CM | POA: Diagnosis not present

## 2020-05-19 DIAGNOSIS — E1169 Type 2 diabetes mellitus with other specified complication: Secondary | ICD-10-CM | POA: Diagnosis not present

## 2020-05-19 DIAGNOSIS — E1142 Type 2 diabetes mellitus with diabetic polyneuropathy: Secondary | ICD-10-CM | POA: Diagnosis not present

## 2020-05-19 LAB — POCT GLYCOSYLATED HEMOGLOBIN (HGB A1C): Hemoglobin A1C: 9 % — AB (ref 4.0–5.6)

## 2020-05-19 MED ORDER — NOVOLOG FLEXPEN 100 UNIT/ML ~~LOC~~ SOPN: PEN_INJECTOR | SUBCUTANEOUS | 6 refills | Status: DC

## 2020-05-19 MED ORDER — DAPAGLIFLOZIN PROPANEDIOL 5 MG PO TABS: 5.0000 mg | ORAL_TABLET | Freq: Every day | ORAL | 6 refills | Status: DC

## 2020-05-19 MED ORDER — METFORMIN HCL ER 750 MG PO TB24: 750.0000 mg | ORAL_TABLET | Freq: Two times a day (BID) | ORAL | 3 refills | Status: DC

## 2020-05-19 MED ORDER — LANTUS SOLOSTAR 100 UNIT/ML ~~LOC~~ SOPN: 50.0000 [IU] | PEN_INJECTOR | Freq: Every day | SUBCUTANEOUS | 3 refills | Status: DC

## 2020-05-19 MED ORDER — PEN NEEDLES 32G X 4 MM MISC: 1.0000 | Freq: Four times a day (QID) | 3 refills | Status: DC

## 2020-05-19 NOTE — Patient Instructions (Addendum)
-   Continue Metformin 750 mg to 1 tablet TWICE a day  - Continue Lantus 50 units ONCE a day  - Continue  Novolog 14 units with Breakfast , 16 units with Lunch and 16 units with Supper   - Novolog correctional insulin: ADD extra units on insulin to your meal-time Novolog dose if your blood sugars are higher than .170 Use the scale below to help guide you:   Blood sugar before meal Number of units to inject  Less than 170 0 unit  171 -  190 1 units  191 -  210 2 units  211 -  230 3 units  231 -  250 4 units  251 -  270 5 units  271 -  290 6 units  291 -  310 7 units  311 -  330 8 units    Choose healthy, lower carb lower calorie snacks: toss salad, vegetables, cottage cheese, peanut butter, low fat cheese / string cheese, lower sodium deli meat, tuna salad or chicken salad, boiled eggs       HOW TO TREAT LOW BLOOD SUGARS (Blood sugar LESS THAN 70 MG/DL)  Please follow the RULE OF 15 for the treatment of hypoglycemia treatment (when your (blood sugars are less than 70 mg/dL)    STEP 1: Take 15 grams of carbohydrates when your blood sugar is low, which includes:   3-4 GLUCOSE TABS  OR  3-4 OZ OF JUICE OR REGULAR SODA OR  ONE TUBE OF GLUCOSE GEL     STEP 2: RECHECK blood sugar in 15 MINUTES STEP 3: If your blood sugar is still low at the 15 minute recheck --> then, go back to STEP 1 and treat AGAIN with another 15 grams of carbohydrates.
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
## 2020-05-20 ENCOUNTER — Telehealth: Payer: Self-pay | Admitting: Internal Medicine

## 2020-05-20 ENCOUNTER — Encounter: Payer: Medicaid Other | Attending: Gynecologic Oncology | Admitting: Dietician

## 2020-05-20 ENCOUNTER — Encounter: Payer: Self-pay | Admitting: Dietician

## 2020-05-20 DIAGNOSIS — Z794 Long term (current) use of insulin: Secondary | ICD-10-CM | POA: Diagnosis present

## 2020-05-20 DIAGNOSIS — E1165 Type 2 diabetes mellitus with hyperglycemia: Secondary | ICD-10-CM | POA: Insufficient documentation

## 2020-05-20 NOTE — Progress Notes (Signed)
Diabetes Self-Management Education  Visit Type: Follow-up  Appt. Start Time: 1545 Appt. End Time: 1625  05/20/2020  [name], identified by name and date of birth, is a 55 y.o. female with a diagnosis of Diabetes:  .   ASSESSMENT Patient is here today alone.  She was last seen by this RD 02/23/2020. She would like for me to teach her how to use her Dexcom.  She does not have a receiver.  Her phone is not compatible. Call placed to Dex[employer] who stated to call Medicaid. Called Medicaid (call reference [id number]) stated she needed a prior authorization.  Explained that she already has the Sensors and transmitter.  Representative then said that prior authorization was not needed and provided a code that she said would indicate that this product is cover.  Called [employer].  They entered the number and it was revoked stating that she needed prior authorization. Called Medicaid again who stated that a prior authorization is needed.  Forwarded the request to MD's medical assistant.   Patient continues to smoke but states that she has reduced this. She has a tooth that needs to be pulled and has a hard time eating.  She has an appointment on June 1 to do this. Due to pain, she is not exercising Her 74 yo grandson was recently killed in a hit and run. She is asking if she could take a woman's MVI.  This should be acceptable. She is trying to avoid juice and is not drinking sweet tea.  History includes Type 2 diabetes since 2008, endometrial cancer (in remission per patient), OSA but does not like C-pap, HTN, HLD, depression, vitamin D deficiency, pancreatitis, fatty liver, GERD She smokes and has decreased from 2 packs to 4 cigarettes per day.  Medications include Lantus50units q am and pm, Novolog 14 units 15 minutes before each meal,metformin bid ("but I want to quit as I read this is not good for my Liver or Kidneys"),Jardiance,phentermine, 2000 units vitamin D daily A1C 8.8% 02/18/2020,  11.5% 10/22/2019 11.6% 09/30/19  Weight hx: 281 lbs 05/20/2020 286.6 lbs 02/23/2020 276.7 lbs 12/23/2019 280 lbs 10/21/2019 286 lbs 09/23/2019 280 lbs 07/21/2019 347 lbs 05/2014 400 lbs highest weight and lost due to drug abuse  Body Composition Scale Sept 14, 2021 Dec 23, 2019 Feb 23 2019  Current Body Weight 286 lbs 276.7 lbs 286.6 lbs  Total Body Fat % 48.3 47.6 48.3  Visceral Fat 20 19 20   Fat-Free Mass % 51.6 52.3 51.6  Total Body Water % 40.3 40.6 40.3  Muscle-Mass lbs 32.1 32 32.1  BMI 46.2 44.6 46.2  Body Fat Displacement     Torso lbs 85.9 81.7 86  Left Leg lbs 17.1 16.3 17.2  Right Leg lbs 17.1 16.3 17.2  Left Arm lbs 8.5 8.1 8.6  Right Arm lbs 8.5 8.1 8.6    Patient lives with her fiance. He is currently taking chemo. She does the shopping and cooking. She feels isolated since covid. They avoid pork, chicken, and recently stopped chips and regular soda, uses some salt.  She reported drug use and food security issues 10 years ago. She is on disability.Gets SNAP. Used to do [employer] for exercise. Fiance is taking chemofor CLL and has increased pain and difficulty eating at times.  Weight 281 lb (127.5 kg), last menstrual period 01/23/2014. Body mass index is 45.35 kg/m.   Diabetes Self-Management Education - 05/20/20 1600      Visit Information   Visit Type Follow-up  Pre-Education Assessment   Patient understands the diabetes disease and treatment process. Demonstrates understanding / competency    Patient understands incorporating nutritional management into lifestyle. Needs Review    Patient undertands incorporating physical activity into lifestyle. Demonstrates understanding / competency    Patient understands using medications safely. Demonstrates understanding / competency    Patient understands monitoring blood glucose, interpreting and using results Needs Review    Patient understands prevention,  detection, and treatment of acute complications. Demonstrates understanding / competency    Patient understands prevention, detection, and treatment of chronic complications. Demonstrates understanding / competency    Patient understands how to develop strategies to address psychosocial issues. Needs Review    Patient understands how to develop strategies to promote health/change behavior. Needs Review      Complications   Last HgB A1C per patient/outside source 9 %   05/19/2020   How often do you check your blood sugar? 1-2 times/day    Fasting Blood glucose range (mg/dL) 180-200    Postprandial Blood glucose range (mg/dL) 180-200      Dietary Intake   Breakfast eggs, grits    Lunch soup, sugar free pudding    Dinner peanut butter sandwich    Beverage(s) water, diet soda, coffee with milk      Exercise   Exercise Type ADL's      Patient Education   Nutrition management  Other (comment)   diet related to increased tooth pain   Medications Reviewed patients medication for diabetes, action, purpose, timing of dose and side effects.    Monitoring Other (comment)   problem solving to obtain Dexcom   Psychosocial adjustment Worked with patient to identify barriers to care and solutions;Identified and addressed patients feelings and concerns about diabetes      Individualized Goals (developed by patient)   Nutrition General guidelines for healthy choices and portions discussed    Physical Activity Not Applicable    Medications take my medication as prescribed    Monitoring  test my blood glucose as discussed    Problem Solving Dexcom receiver    Reducing Risk stop smoking    Health Coping discuss diabetes with (comment)      Patient Self-Evaluation of Goals - Patient rates self as meeting previously set goals (% of time)   Nutrition 50 - 75 %    Physical Activity 25 - 50%    Medications >75%    Monitoring >75%    Problem Solving 50 - 75 %    Reducing Risk 50 - 75 %    Health Coping  25 - 50%      Post-Education Assessment   Patient understands the diabetes disease and treatment process. Demonstrates understanding / competency    Patient understands incorporating nutritional management into lifestyle. Needs Review    Patient undertands incorporating physical activity into lifestyle. Needs Review    Patient understands using medications safely. Needs Review    Patient understands monitoring blood glucose, interpreting and using results Needs Review    Patient understands prevention, detection, and treatment of acute complications. Demonstrates understanding / competency    Patient understands prevention, detection, and treatment of chronic complications. Demonstrates understanding / competency    Patient understands how to develop strategies to address psychosocial issues. Needs Review    Patient understands how to develop strategies to promote health/change behavior. Needs Review      Outcomes   Expected Outcomes Other (comment)   demonstrated interest but increased pain and inability to get receiver  for Dexcom today   Future DMSE 4-6 wks    Program Status Not Completed           Individualized Plan for Diabetes Self-Management Training:   Learning Objective:  Patient will have a greater understanding of diabetes self-management. Patient education plan is to attend individual and/or group sessions per assessed needs and concerns.   Plan:   Patient Instructions  Continue to consistently take your medication. Eat breakfast, lunch, and dinner daily. Choose soft foods such as oatmeal, sugar free pudding, soft fruit or canned fruit without sugar, soup and other easy to eat foods.  Please call me when you get your Dexcom Receiver of if you have not received this in 1 month.  Continue to decrease your smoking.    Expected Outcomes:  Other (comment) (demonstrated interest but increased pain and inability to get receiver for Dexcom today)  Education material  provided:   If problems or questions, patient to contact team via:  Phone  Future DSME appointment: 4-6 wks

## 2020-05-20 NOTE — Patient Instructions (Signed)
Continue to consistently take your medication. Eat breakfast, lunch, and dinner daily. Choose soft foods such as oatmeal, sugar free pudding, soft fruit or canned fruit without sugar, soup and other easy to eat foods.  Please call me when you get your Dexcom Receiver of if you have not received this in 1 month.  Continue to decrease your smoking.

## 2020-05-21 ENCOUNTER — Other Ambulatory Visit: Payer: Self-pay | Admitting: Internal Medicine

## 2020-05-21 MED ORDER — DEXCOM G6 RECEIVER DEVI: 0 refills | Status: DC

## 2020-05-24 NOTE — Telephone Encounter (Signed)
Place in [facility] inbox, it was a fax form

## 2020-05-24 NOTE — Telephone Encounter (Signed)
I prefer to do the PA. Let me know when you do it, I will need to answer some questions.

## 2020-05-24 NOTE — Telephone Encounter (Signed)
I have spoken to the [facility] rep.  He suggests we send the script to [place] [facility] for the reader and sensors.  It will come in a starter kit, that includes the reader.  Not sure if this will work, but worth a try

## 2020-05-24 NOTE — Telephone Encounter (Signed)
Done

## 2020-05-24 NOTE — Telephone Encounter (Signed)
Have left her 2 messages to call me.

## 2020-05-25 NOTE — Telephone Encounter (Signed)
Faxed to [facility] Medicaid on 05/24/2020

## 2020-05-26 ENCOUNTER — Telehealth: Payer: Self-pay | Admitting: Nutrition

## 2020-05-26 NOTE — Telephone Encounter (Signed)
Message left to call her.  I returned the call and had to leave a message to call me back

## 2020-06-02 ENCOUNTER — Encounter: Payer: Medicaid Other | Admitting: Nutrition

## 2020-06-08 ENCOUNTER — Other Ambulatory Visit: Payer: Self-pay

## 2020-06-08 ENCOUNTER — Encounter: Payer: Medicaid Other | Admitting: Nutrition

## 2020-06-08 DIAGNOSIS — E1165 Type 2 diabetes mellitus with hyperglycemia: Secondary | ICD-10-CM

## 2020-06-08 DIAGNOSIS — Z794 Long term (current) use of insulin: Secondary | ICD-10-CM

## 2020-06-09 NOTE — Progress Notes (Signed)
This patient was trained on how o use the [place].  She is using the hand held device, because her phone would not support the app.  She was told to bring her receiver to each visit.  She agreed to do this and reported good understanding of how to insert/ use the sensor, and had no final questions, She was given information on how to link the receiver to our practice.

## 2020-06-09 NOTE — Patient Instructions (Signed)
Turn reader on and scan sensor Change sensor every 14 days.

## 2020-06-22 ENCOUNTER — Ambulatory Visit: Payer: Medicaid Other | Admitting: Podiatry

## 2020-06-24 ENCOUNTER — Other Ambulatory Visit: Payer: Self-pay | Admitting: Internal Medicine

## 2020-06-24 DIAGNOSIS — Z794 Long term (current) use of insulin: Secondary | ICD-10-CM

## 2020-06-25 ENCOUNTER — Other Ambulatory Visit: Payer: Self-pay

## 2020-06-28 ENCOUNTER — Ambulatory Visit: Payer: Medicaid Other | Admitting: Podiatry

## 2020-06-28 ENCOUNTER — Encounter: Payer: Self-pay | Admitting: Podiatry

## 2020-06-28 ENCOUNTER — Other Ambulatory Visit: Payer: Self-pay

## 2020-06-28 DIAGNOSIS — M79674 Pain in right toe(s): Secondary | ICD-10-CM

## 2020-06-28 DIAGNOSIS — M79675 Pain in left toe(s): Secondary | ICD-10-CM

## 2020-06-28 DIAGNOSIS — E1149 Type 2 diabetes mellitus with other diabetic neurological complication: Secondary | ICD-10-CM

## 2020-06-28 DIAGNOSIS — B351 Tinea unguium: Secondary | ICD-10-CM | POA: Diagnosis not present

## 2020-06-28 NOTE — Progress Notes (Signed)
Complaint:  Visit Type: Patient returns to my office for continued preventative foot care services. Complaint: Patient states" my nails have grown long and thick and become painful to walk and wear shoes" Patient has been diagnosed with DM with neuropathy. The patient presents for preventative foot care services.  Podiatric Exam: Vascular: dorsalis pedis and posterior tibial pulses are palpable bilateral. Capillary return is immediate. Temperature gradient is WNL. Skin turgor WNL  Sensorium: Diminished  Semmes Weinstein monofilament test. Normal tactile sensation bilaterally. Nail Exam: Pt has thick disfigured discolored nails with subungual debris noted bilateral entire nail hallux through fifth toenails Ulcer Exam: There is no evidence of ulcer or pre-ulcerative changes or infection. Orthopedic Exam: Muscle tone and strength are WNL. No limitations in general ROM. No crepitus or effusions noted. Foot type and digits show no abnormalities. HAV  B/L. Skin: No Porokeratosis. No infection or ulcers  Diagnosis:  Onychomycosis, , Pain in right toe, pain in left toes  Treatment & Plan Procedures and Treatment: Consent by patient was obtained for treatment procedures.   Debridement of mycotic and hypertrophic toenails, 1 through 5 bilateral and clearing of subungual debris. No ulceration, no infection noted.  Return Visit-Office Procedure: Patient instructed to return to the office for a follow up visit 3 months for continued evaluation and treatment.    Isley Weisheit DPM 

## 2020-06-29 ENCOUNTER — Encounter (HOSPITAL_COMMUNITY): Payer: Self-pay | Admitting: Emergency Medicine

## 2020-06-29 ENCOUNTER — Other Ambulatory Visit: Payer: Self-pay

## 2020-06-29 ENCOUNTER — Ambulatory Visit (HOSPITAL_COMMUNITY)
Admission: EM | Admit: 2020-06-29 | Discharge: 2020-06-29 | Disposition: A | Discharge status: Home / Self Care | Payer: Medicaid Other | Attending: Emergency Medicine | Admitting: Emergency Medicine

## 2020-06-29 DIAGNOSIS — J0100 Acute maxillary sinusitis, unspecified: Secondary | ICD-10-CM | POA: Diagnosis not present

## 2020-06-29 LAB — POCT URINALYSIS DIPSTICK, ED / UC
Bilirubin Urine: NEGATIVE
Glucose, UA: NEGATIVE mg/dL
Ketones, ur: NEGATIVE mg/dL
Leukocytes,Ua: NEGATIVE
Nitrite: NEGATIVE
Protein, ur: NEGATIVE mg/dL
Specific Gravity, Urine: 1.01 (ref 1.005–1.030)
Urobilinogen, UA: 0.2 mg/dL (ref 0.0–1.0)
pH: 5.5 (ref 5.0–8.0)

## 2020-06-29 MED ORDER — FLUCONAZOLE 150 MG PO TABS: 150.0000 mg | ORAL_TABLET | Freq: Every day | ORAL | 0 refills | Status: AC

## 2020-06-29 MED ORDER — AMOXICILLIN-POT CLAVULANATE 875-125 MG PO TABS: 1.0000 | ORAL_TABLET | Freq: Two times a day (BID) | ORAL | 0 refills | Status: DC

## 2020-06-29 NOTE — Discharge Instructions (Addendum)
Take antibiotic twice a day for 7 days  Can use tylenol 500 mg or ibuprofen 600 mg for pain management  Can use over the counter mucinex and robitussin to hep with cough and congestion   Take diflucan pill once today then if having symptoms in 3 days take second pill   Sti screen pending, you will be called if positive for infection

## 2020-06-29 NOTE — ED Triage Notes (Signed)
Pt presents with cough and congestion xs 1 week. States also has bad tooth that needs to be pulled. States having vaginal itching and burning xs 1 week.   States recently stated [place] and was told by PCP could cause yeast infection.

## 2020-06-30 LAB — CERVICOVAGINAL ANCILLARY ONLY
Bacterial Vaginitis (gardnerella): NEGATIVE
Candida Glabrata: NEGATIVE
Candida Vaginitis: NEGATIVE
Chlamydia: NEGATIVE
Comment: NEGATIVE
Comment: NEGATIVE
Comment: NEGATIVE
Comment: NEGATIVE
Comment: NEGATIVE
Comment: NORMAL
Neisseria Gonorrhea: NEGATIVE
Trichomonas: NEGATIVE
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
## 2020-07-13 ENCOUNTER — Encounter: Payer: Self-pay | Admitting: *Deleted

## 2020-07-21 ENCOUNTER — Encounter: Payer: Medicaid Other | Admitting: Student

## 2020-07-27 ENCOUNTER — Telehealth: Payer: Self-pay

## 2020-07-27 ENCOUNTER — Other Ambulatory Visit: Payer: Self-pay

## 2020-07-27 ENCOUNTER — Encounter: Payer: Self-pay | Admitting: Student

## 2020-07-27 ENCOUNTER — Ambulatory Visit: Payer: Medicaid Other | Admitting: Student

## 2020-07-27 VITALS — BP 128/80 | HR 72 | Temp 98.2°F | Ht 66.0 in | Wt 279.6 lb

## 2020-07-27 DIAGNOSIS — Z794 Long term (current) use of insulin: Secondary | ICD-10-CM | POA: Diagnosis not present

## 2020-07-27 DIAGNOSIS — E1142 Type 2 diabetes mellitus with diabetic polyneuropathy: Secondary | ICD-10-CM

## 2020-07-27 DIAGNOSIS — N939 Abnormal uterine and vaginal bleeding, unspecified: Secondary | ICD-10-CM

## 2020-07-27 DIAGNOSIS — B3731 Acute candidiasis of vulva and vagina: Secondary | ICD-10-CM

## 2020-07-27 DIAGNOSIS — J324 Chronic pansinusitis: Secondary | ICD-10-CM

## 2020-07-27 DIAGNOSIS — B373 Candidiasis of vulva and vagina: Secondary | ICD-10-CM

## 2020-07-27 MED ORDER — DAPAGLIFLOZIN PROPANEDIOL 5 MG PO TABS: 5.0000 mg | ORAL_TABLET | Freq: Every day | ORAL | 6 refills | Status: DC

## 2020-07-27 MED ORDER — CETIRIZINE HCL 10 MG PO TABS: 10.0000 mg | ORAL_TABLET | Freq: Every day | ORAL | 3 refills | Status: DC

## 2020-07-27 NOTE — Assessment & Plan Note (Addendum)
Assessment: Patient presents to the clinic today with 1 month of intermittent vaginal bleeding.  She states that the bleeding comes and goes and at times it is heavy other times it is late.  She denies any triggers.  She denies any sexual intercourse.  With patient's history of stage I grade 1 endometrial adenocarcinoma status post D&C and Mirena IUD placement, I am concerned.  Appears as though she has not followed up with her GYN oncology team.  When asked why she has not seen them, she states that she was not sure as to who to call.  I called patient's OB/GYN office she has an appointment scheduled for the 27th of this month at 2 PM.  Informed patient of this and to make sure she follows up the appointment.  Patient given strict return precautions and to go to ED if heavy menstrual bleeding does not stop.  She did endorse some generalized fatigue and weakness, as such we will order CBC as well as iron studies.  Plan: -Follow-up gynecology oncology team -CBC, iron studies pending  Addendum:  CBC and iron studies unremarkable.

## 2020-07-27 NOTE — Assessment & Plan Note (Signed)
Assessment: Current regimen of Farxiga 5 mg daily, Lantus 50 units daily, NovoLog 14 units with breakfast and 16 with lunch and supper.  Patient states she has adhered to this regimen.  She will continue to follow with endocrinology for medication changes.  Plan: -Continue to follow-up with endocrinology

## 2020-07-27 NOTE — Telephone Encounter (Signed)
Pt at Internal medicine clinic.  She is having vaginal bleeding.  IUD in for endometrial cancer. She has not seen [name] since 04-22-19. Scheduled an appointment for 08-05-19 at 2:30pm. And Dr. Johnney Ou will give her the appointment.

## 2020-07-27 NOTE — Assessment & Plan Note (Signed)
Assessment: Patient endorses history of chronic pansinusitis.  Recently treated in the ED with course of Augmentin.  She states she has been out of her Zyrtec for some time.  On exam nasal turbinates are inflamed with erythema.  We will reorder Zyrtec and encourage patient to continue Flonase.  Plan: -Reorder Zyrtec, continue Flonase

## 2020-07-27 NOTE — Patient Instructions (Signed)
Thank you, [name] for allowing [name] to provide your care today. Today we discussed .  Vaginal bleeding I have called and schedule you an appointment to see your OB/GYN [name] on 07/27 at 2 PM.  If you have any vaginal bleeding that is not stopping or you start to become symptomatic including lightheaded dizziness please go to the closest emergency department.  History of diabetes Please follow-up with your endocrinologist.    Allergies/sinusitis I have refilled your Zyrtec, please continue to use this as well as your Flonase.  I have ordered the following labs for you:  Lab Orders  No laboratory test(s) ordered today     Referrals ordered today:   Referral Orders  No referral(s) requested today     I have ordered the following medication/changed the following medications:   Stop the following medications: Medications Discontinued During This Encounter  Medication Reason   cetirizine (ZYRTEC) 10 MG tablet Reorder   dapagliflozin propanediol (FARXIGA) 5 MG TABS tablet Reorder     Start the following medications: Meds ordered this encounter  Medications   cetirizine (ZYRTEC) 10 MG tablet    Sig: Take 1 tablet (10 mg total) by mouth daily.    Dispense:  90 tablet    Refill:  3   dapagliflozin propanediol (FARXIGA) 5 MG TABS tablet    Sig: Take 1 tablet (5 mg total) by mouth daily.    Dispense:  30 tablet    Refill:  6     Should you have any questions or concerns please call the internal medicine clinic at [phone].     Tara Keller, D.O. [place]

## 2020-07-27 NOTE — Assessment & Plan Note (Signed)
Assessment: Patient presents to the ED recently with vaginal itching.  She has longstanding history of vaginal candidiasis thought to be secondary to her uncontrolled diabetes.  She is also on [place] however vaginal candidiasis was prior to starting [place] and has not increased incidence since.  Was treated with 1 dose of Diflucan while in the ED and had resolution of her itching.  Been seen by OB/GYN in the past for this and they believe that it will be an ongoing chronic issue as long as her diabetes is uncontrolled.  Plan: -Continue to monitor and treat accordingly.
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
## 2020-07-28 ENCOUNTER — Encounter: Payer: Self-pay | Admitting: Student

## 2020-07-28 LAB — CBC
Hematocrit: 37.9 % (ref 34.0–46.6)
Hemoglobin: 12 g/dL (ref 11.1–15.9)
MCH: 27.9 pg (ref 26.6–33.0)
MCHC: 31.7 g/dL (ref 31.5–35.7)
MCV: 88 fL (ref 79–97)
Platelets: 287 10*3/uL (ref 150–450)
RBC: 4.3 x10E6/uL (ref 3.77–5.28)
RDW: 13.1 % (ref 11.7–15.4)
WBC: 9.4 10*3/uL (ref 3.4–10.8)

## 2020-07-28 LAB — IRON AND TIBC
Iron Saturation: 21 % (ref 15–55)
Iron: 56 ug/dL (ref 27–159)
Total Iron Binding Capacity: 269 ug/dL (ref 250–450)
UIBC: 213 ug/dL (ref 131–425)

## 2020-07-28 LAB — FERRITIN: Ferritin: 164 ng/mL — ABNORMAL HIGH (ref 15–150)

## 2020-07-29 NOTE — Progress Notes (Signed)
Internal Medicine Clinic Attending  Case discussed with Dr. Katsadouros  At the time of the visit.  We reviewed the resident's history and exam and pertinent patient test results.  I agree with the assessment, diagnosis, and plan of care documented in the resident's note.  

## 2020-08-04 ENCOUNTER — Encounter: Payer: Self-pay | Admitting: Gynecologic Oncology

## 2020-08-04 ENCOUNTER — Inpatient Hospital Stay: Payer: Medicaid Other | Attending: Gynecologic Oncology | Admitting: Gynecologic Oncology

## 2020-08-04 ENCOUNTER — Other Ambulatory Visit: Payer: Self-pay

## 2020-08-04 VITALS — BP 126/71 | HR 90 | Temp 98.4°F | Resp 18 | Ht 66.0 in | Wt 271.2 lb

## 2020-08-04 DIAGNOSIS — Z794 Long term (current) use of insulin: Secondary | ICD-10-CM | POA: Diagnosis not present

## 2020-08-04 DIAGNOSIS — Z6841 Body Mass Index (BMI) 40.0 and over, adult: Secondary | ICD-10-CM | POA: Diagnosis not present

## 2020-08-04 DIAGNOSIS — Z79899 Other long term (current) drug therapy: Secondary | ICD-10-CM | POA: Insufficient documentation

## 2020-08-04 DIAGNOSIS — N840 Polyp of corpus uteri: Secondary | ICD-10-CM | POA: Insufficient documentation

## 2020-08-04 DIAGNOSIS — C541 Malignant neoplasm of endometrium: Secondary | ICD-10-CM | POA: Insufficient documentation

## 2020-08-04 DIAGNOSIS — Z7982 Long term (current) use of aspirin: Secondary | ICD-10-CM | POA: Insufficient documentation

## 2020-08-04 DIAGNOSIS — F17210 Nicotine dependence, cigarettes, uncomplicated: Secondary | ICD-10-CM | POA: Insufficient documentation

## 2020-08-04 DIAGNOSIS — K3184 Gastroparesis: Secondary | ICD-10-CM | POA: Insufficient documentation

## 2020-08-04 DIAGNOSIS — I10 Essential (primary) hypertension: Secondary | ICD-10-CM | POA: Insufficient documentation

## 2020-08-04 DIAGNOSIS — E1165 Type 2 diabetes mellitus with hyperglycemia: Secondary | ICD-10-CM | POA: Diagnosis not present

## 2020-08-04 DIAGNOSIS — E785 Hyperlipidemia, unspecified: Secondary | ICD-10-CM | POA: Insufficient documentation

## 2020-08-04 DIAGNOSIS — E1143 Type 2 diabetes mellitus with diabetic autonomic (poly)neuropathy: Secondary | ICD-10-CM | POA: Insufficient documentation

## 2020-08-04 DIAGNOSIS — F32A Depression, unspecified: Secondary | ICD-10-CM | POA: Insufficient documentation
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
## 2020-08-04 NOTE — Patient Instructions (Signed)
Dr Denman George took a biopsy today. Her office will call you with the results.  Please contact Dr Serita Grit office (at [phone]) in the fall to request an appointment with her for January, 2023.

## 2020-08-06 ENCOUNTER — Telehealth: Payer: Self-pay | Admitting: Oncology

## 2020-08-06 LAB — SURGICAL PATHOLOGY

## 2020-08-06 NOTE — Telephone Encounter (Signed)
Tara Keller called asking about her biopsy results.  Advised her that will will call her back on Monday to review.

## 2020-08-09 NOTE — Telephone Encounter (Signed)
Told Ms Tara Keller that the pathology from the biopsy is showing endometrial cancer. It remains a grade I which is the least aggressive type. The pathology results are showing that the IUD is having an effect on the lining.  She needs to f/u in January for visit with another biopsy.  Told her to call in December to schedule the appointment. Told her that [name] is leaving the practice  at the end of September. She will be going to [facility].  She will need to follow up with her partner Dr. Berline Lopes in January. Pt verbalized understanding.

## 2020-08-19 ENCOUNTER — Emergency Department (HOSPITAL_COMMUNITY)
Admission: EM | Admit: 2020-08-19 | Discharge: 2020-08-19 | Disposition: A | Discharge status: Home / Self Care | Payer: Medicaid Other | Attending: Emergency Medicine | Admitting: Emergency Medicine

## 2020-08-19 ENCOUNTER — Other Ambulatory Visit: Payer: Self-pay

## 2020-08-19 ENCOUNTER — Encounter (HOSPITAL_COMMUNITY): Payer: Self-pay

## 2020-08-19 DIAGNOSIS — Z8541 Personal history of malignant neoplasm of cervix uteri: Secondary | ICD-10-CM | POA: Insufficient documentation

## 2020-08-19 DIAGNOSIS — Z7951 Long term (current) use of inhaled steroids: Secondary | ICD-10-CM | POA: Insufficient documentation

## 2020-08-19 DIAGNOSIS — E1142 Type 2 diabetes mellitus with diabetic polyneuropathy: Secondary | ICD-10-CM | POA: Diagnosis not present

## 2020-08-19 DIAGNOSIS — Z794 Long term (current) use of insulin: Secondary | ICD-10-CM | POA: Insufficient documentation

## 2020-08-19 DIAGNOSIS — Z79899 Other long term (current) drug therapy: Secondary | ICD-10-CM | POA: Diagnosis not present

## 2020-08-19 DIAGNOSIS — J45909 Unspecified asthma, uncomplicated: Secondary | ICD-10-CM | POA: Diagnosis not present

## 2020-08-19 DIAGNOSIS — M79604 Pain in right leg: Secondary | ICD-10-CM | POA: Diagnosis present

## 2020-08-19 DIAGNOSIS — F17210 Nicotine dependence, cigarettes, uncomplicated: Secondary | ICD-10-CM | POA: Diagnosis not present

## 2020-08-19 DIAGNOSIS — Z7984 Long term (current) use of oral hypoglycemic drugs: Secondary | ICD-10-CM | POA: Insufficient documentation

## 2020-08-19 DIAGNOSIS — Z8542 Personal history of malignant neoplasm of other parts of uterus: Secondary | ICD-10-CM | POA: Insufficient documentation

## 2020-08-19 DIAGNOSIS — Z7982 Long term (current) use of aspirin: Secondary | ICD-10-CM | POA: Insufficient documentation

## 2020-08-19 DIAGNOSIS — I10 Essential (primary) hypertension: Secondary | ICD-10-CM | POA: Insufficient documentation

## 2020-08-19 MED ORDER — METHOCARBAMOL 500 MG PO TABS: 500.0000 mg | ORAL_TABLET | Freq: Two times a day (BID) | ORAL | 0 refills | Status: DC
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
## 2020-08-19 NOTE — ED Triage Notes (Signed)
Pt c/o ongoing R leg pain originating in buttock area for the last week. Denies known injury. States that she feels her limb is colder than other extremities. Hx of diabetes.

## 2020-08-19 NOTE — Discharge Instructions (Addendum)
Please continue acetaminophen, add Robaxin, use heat and cold as needed and follow-up with your primary care doctor Return daily to the emergency department if you are having weakness or your foot becomes discolored.

## 2020-08-19 NOTE — ED Notes (Signed)
Patient discharge instructions reviewed with the patient. The patient verbalized understanding of instructions. Patient discharged. 

## 2020-09-01 ENCOUNTER — Ambulatory Visit (HOSPITAL_COMMUNITY)
Admission: RE | Admit: 2020-09-01 | Discharge: 2020-09-01 | Disposition: A | Discharge status: Home / Self Care | Payer: Medicaid Other | Source: Ambulatory Visit | Attending: Internal Medicine | Admitting: Internal Medicine

## 2020-09-01 ENCOUNTER — Other Ambulatory Visit: Payer: Self-pay | Admitting: Internal Medicine

## 2020-09-01 ENCOUNTER — Ambulatory Visit: Payer: Medicaid Other | Admitting: Internal Medicine

## 2020-09-01 ENCOUNTER — Other Ambulatory Visit: Payer: Self-pay

## 2020-09-01 ENCOUNTER — Encounter: Payer: Self-pay | Admitting: Internal Medicine

## 2020-09-01 VITALS — BP 121/78 | HR 87 | Temp 98.0°F | Ht 66.0 in | Wt 266.7 lb

## 2020-09-01 DIAGNOSIS — M79604 Pain in right leg: Secondary | ICD-10-CM

## 2020-09-01 DIAGNOSIS — F32A Depression, unspecified: Secondary | ICD-10-CM | POA: Diagnosis not present

## 2020-09-01 DIAGNOSIS — F411 Generalized anxiety disorder: Secondary | ICD-10-CM | POA: Diagnosis not present

## 2020-09-01 DIAGNOSIS — R1013 Epigastric pain: Secondary | ICD-10-CM | POA: Diagnosis not present

## 2020-09-01 DIAGNOSIS — M25551 Pain in right hip: Secondary | ICD-10-CM

## 2020-09-01 MED ORDER — SUCRALFATE 1 G PO TABS: ORAL_TABLET | ORAL | 1 refills | Status: AC

## 2020-09-01 MED ORDER — DULOXETINE HCL 30 MG PO CPEP: ORAL_CAPSULE | ORAL | 1 refills | Status: DC

## 2020-09-01 MED ORDER — NAPROXEN 500 MG PO TABS: 500.0000 mg | ORAL_TABLET | Freq: Two times a day (BID) | ORAL | 0 refills | Status: DC

## 2020-09-01 NOTE — Assessment & Plan Note (Addendum)
Patient reported currently taking Wellbutrin and duloxetine.  However per chart review I do not see any refills on Wellbutrin since February 2021.  Will call and clarify with patient as to what medications she has been taking for her anxiety and depression.  -Increase duloxetine to 30 mg for 1 week then uptitrate to 60 mg if tolerated -Follow-up with patient regarding Wellbutrin use
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
## 2020-09-01 NOTE — Patient Instructions (Signed)
I have sent in a referral for physical therapy and a prescription for an NSAID medication, naproxen 500 mg up to 2 times daily.  Take this medication as needed for severe pain.  Make sure to take this medication with food.  I will call you with the results of your x-rays.
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
## 2020-09-01 NOTE — Assessment & Plan Note (Signed)
Patient reports that she has been taking Wellbutrin and duloxetine.  However per chart review Wellbutrin was last filled in 2021.  Will call and discussed with patient regarding this medication.  See further details under depression.

## 2020-09-02 ENCOUNTER — Other Ambulatory Visit: Payer: Self-pay | Admitting: Internal Medicine

## 2020-09-02 ENCOUNTER — Telehealth: Payer: Self-pay

## 2020-09-02 DIAGNOSIS — M79604 Pain in right leg: Secondary | ICD-10-CM

## 2020-09-02 DIAGNOSIS — M25551 Pain in right hip: Secondary | ICD-10-CM

## 2020-09-02 MED ORDER — IBUPROFEN 800 MG PO TABS: 800.0000 mg | ORAL_TABLET | Freq: Three times a day (TID) | ORAL | 0 refills | Status: AC | PRN

## 2020-09-02 NOTE — Progress Notes (Signed)
Patient not tolerating naproxen due to stomach pain. Sent in ibuprofen 800 mg q8h prn.

## 2020-09-02 NOTE — Telephone Encounter (Signed)
Returned call to patient. States she has taken the naproxen with food and it still hurts her stomach. States she does not have this problem with ibuprofen. She is requesting Rx for ibuprofen 800 mg to be sent to [facility].

## 2020-09-02 NOTE — Telephone Encounter (Signed)
Pt states naproxen (NAPROSYN) 500 MG tablet causing her to have stomach pain. Please call pt back.

## 2020-09-07 NOTE — Progress Notes (Signed)
Internal Medicine Clinic Attending ° °Case discussed with Dr. Rehman  At the time of the visit.  We reviewed the resident’s history and exam and pertinent patient test results.  I agree with the assessment, diagnosis, and plan of care documented in the resident’s note.  ° °

## 2020-09-16 ENCOUNTER — Ambulatory Visit: Payer: Medicaid Other | Attending: Internal Medicine

## 2020-09-16 ENCOUNTER — Other Ambulatory Visit: Payer: Self-pay

## 2020-09-16 DIAGNOSIS — M79604 Pain in right leg: Secondary | ICD-10-CM | POA: Diagnosis not present

## 2020-09-16 DIAGNOSIS — M79651 Pain in right thigh: Secondary | ICD-10-CM | POA: Diagnosis present

## 2020-09-16 DIAGNOSIS — R262 Difficulty in walking, not elsewhere classified: Secondary | ICD-10-CM | POA: Insufficient documentation

## 2020-09-16 DIAGNOSIS — M25551 Pain in right hip: Secondary | ICD-10-CM | POA: Insufficient documentation

## 2020-09-16 DIAGNOSIS — R2689 Other abnormalities of gait and mobility: Secondary | ICD-10-CM | POA: Diagnosis present
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
## 2020-09-18 NOTE — Patient Instructions (Signed)
  Tennis ball massage against wall

## 2020-09-20 ENCOUNTER — Telehealth: Payer: Self-pay

## 2020-09-20 NOTE — Telephone Encounter (Signed)
Pt called and needs a Prior Auth for Dexcom sensors at [place]. Pt phone: [phone]

## 2020-09-21 ENCOUNTER — Telehealth: Payer: Self-pay | Admitting: Pharmacy Technician

## 2020-09-21 ENCOUNTER — Other Ambulatory Visit: Payer: Self-pay

## 2020-09-21 ENCOUNTER — Encounter (HOSPITAL_COMMUNITY): Payer: Self-pay | Admitting: Oral Surgery

## 2020-09-21 NOTE — Progress Notes (Signed)
Spoke with pt given pre-op instructions.on 0915/2022 do not take farxiga.No bedtime dose of aspart (novolog) insulin  On day of surgery 09/24/2020 do not take metformin. Aspart (novolog) insulin only take if cbg is over 220 then take half of usual correction dose.  Upon awakening on 09/24/2020 check blood sugar .

## 2020-09-21 NOTE — Telephone Encounter (Signed)
Received notification from [place] that prior authorization for [facility] G[facility] is required.   PA submitted on 09/21/2020 Status is pending   [facility] will continue to follow

## 2020-09-22 ENCOUNTER — Encounter: Payer: Self-pay | Admitting: Internal Medicine

## 2020-09-22 ENCOUNTER — Telehealth: Payer: Self-pay | Admitting: Internal Medicine

## 2020-09-22 ENCOUNTER — Ambulatory Visit (INDEPENDENT_AMBULATORY_CARE_PROVIDER_SITE_OTHER): Payer: Medicaid Other | Admitting: Internal Medicine

## 2020-09-22 VITALS — BP 140/80 | HR 93 | Ht 66.0 in | Wt 266.6 lb

## 2020-09-22 DIAGNOSIS — E876 Hypokalemia: Secondary | ICD-10-CM | POA: Diagnosis not present

## 2020-09-22 DIAGNOSIS — Z794 Long term (current) use of insulin: Secondary | ICD-10-CM | POA: Diagnosis not present

## 2020-09-22 DIAGNOSIS — E1169 Type 2 diabetes mellitus with other specified complication: Secondary | ICD-10-CM | POA: Diagnosis not present

## 2020-09-22 DIAGNOSIS — E1142 Type 2 diabetes mellitus with diabetic polyneuropathy: Secondary | ICD-10-CM | POA: Diagnosis not present

## 2020-09-22 LAB — MICROALBUMIN / CREATININE URINE RATIO
Creatinine,U: 49.8 mg/dL
Microalb Creat Ratio: 1.4 mg/g (ref 0.0–30.0)
Microalb, Ur: <0.7 mg/dL (ref 0.0–1.9)

## 2020-09-22 LAB — BASIC METABOLIC PANEL
BUN: 4 mg/dL — ABNORMAL LOW (ref 6–23)
CO2: 28 mEq/L (ref 19–32)
Calcium: 9.6 mg/dL (ref 8.4–10.5)
Chloride: 102 mEq/L (ref 96–112)
Creatinine, Ser: 0.65 mg/dL (ref 0.40–1.20)
GFR: 97.92 mL/min (ref >60.00)
Glucose, Bld: 113 mg/dL — ABNORMAL HIGH (ref 70–99)
Potassium: 3.2 mEq/L — ABNORMAL LOW (ref 3.5–5.1)
Sodium: 139 mEq/L (ref 135–145)

## 2020-09-22 LAB — POCT GLYCOSYLATED HEMOGLOBIN (HGB A1C): Hemoglobin A1C: 8.2 % — AB (ref 4.0–5.6)

## 2020-09-22 MED ORDER — METFORMIN HCL ER 750 MG PO TB24: 750.0000 mg | ORAL_TABLET | Freq: Two times a day (BID) | ORAL | 3 refills | Status: DC

## 2020-09-22 MED ORDER — DEXCOM G6 SENSOR MISC: 1.0000 | 3 refills | Status: DC

## 2020-09-22 MED ORDER — NOVOLOG FLEXPEN 100 UNIT/ML ~~LOC~~ SOPN: PEN_INJECTOR | SUBCUTANEOUS | 3 refills | Status: DC

## 2020-09-22 MED ORDER — INSULIN PEN NEEDLE 31G X 5 MM MISC: 1.0000 | Freq: Four times a day (QID) | 3 refills | Status: DC

## 2020-09-22 MED ORDER — DAPAGLIFLOZIN PROPANEDIOL 10 MG PO TABS: 10.0000 mg | ORAL_TABLET | Freq: Every day | ORAL | 3 refills | Status: DC

## 2020-09-22 MED ORDER — LANTUS SOLOSTAR 100 UNIT/ML ~~LOC~~ SOPN: 54.0000 [IU] | PEN_INJECTOR | Freq: Every day | SUBCUTANEOUS | 3 refills | Status: DC

## 2020-09-22 NOTE — Telephone Encounter (Signed)
Tara Keller,   Can you please do a PA for dexcom sensors?4  Her pharmacy told her she needs PA    Thanks
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
## 2020-09-22 NOTE — Patient Instructions (Addendum)
-   Keep Up the Good Work ! - Continue Metformin 750 mg to 1 tablet TWICE a day  - Increase Lantus to 54  units ONCE a day  - Continue  Novolog 14 units with each meal  - Increase farxiga 10 mg , 1 tablet daily    - Novolog correctional insulin: ADD extra units on insulin to your meal-time Novolog dose if your blood sugars are higher than 170. Use the scale below to help guide you:   Blood sugar before meal Number of units to inject  Less than 170 0 unit  171 -  190 1 units  191 -  210 2 units  211 -  230 3 units  231 -  250 4 units  251 -  270 5 units  271 -  290 6 units  291 -  310 7 units  311 -  330 8 units      HOW TO TREAT LOW BLOOD SUGARS (Blood sugar LESS THAN 70 MG/DL) Please follow the RULE OF 15 for the treatment of hypoglycemia treatment (when your (blood sugars are less than 70 mg/dL)   STEP 1: Take 15 grams of carbohydrates when your blood sugar is low, which includes:  3-4 GLUCOSE TABS  OR 3-4 OZ OF JUICE OR REGULAR SODA OR ONE TUBE OF GLUCOSE GEL    STEP 2: RECHECK blood sugar in 15 MINUTES STEP 3: If your blood sugar is still low at the 15 minute recheck --> then, go back to STEP 1 and treat AGAIN with another 15 grams of carbohydrates.
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
## 2020-09-23 ENCOUNTER — Telehealth: Payer: Self-pay | Admitting: Pharmacy Technician

## 2020-09-23 MED ORDER — POTASSIUM CHLORIDE CRYS ER 20 MEQ PO TBCR
20.0000 meq | EXTENDED_RELEASE_TABLET | Freq: Every day | ORAL | 0 refills | Status: DC
Start: 2020-09-23 — End: 2022-12-13

## 2020-09-23 NOTE — Telephone Encounter (Signed)
Received a fax regarding Prior Authorization from [place] for [place]. Authorization has been DENIED because PT MUST BE USING A PUMP.  [id number]

## 2020-09-23 NOTE — Telephone Encounter (Signed)
Patient has been notified and will wait to hear for approval

## 2020-09-23 NOTE — Telephone Encounter (Signed)
Patient has been notified and will see how much freestyle meter cost.

## 2020-09-23 NOTE — Telephone Encounter (Signed)
Patient Advocate Encounter   Received notification from [name] that prior authorization for [name] is required.   PA submitted on 9.15.22 Key:NO KEY. SUBMITTED TO [employer] via FAX Status is pending    [name] will continue to follow   [name], CPhT [name] Phone: [phone] Fax:  [phone]

## 2020-09-24 ENCOUNTER — Ambulatory Visit (HOSPITAL_COMMUNITY)
Admission: RE | Admit: 2020-09-24 | Discharge: 2020-09-24 | Disposition: A | Discharge status: Home / Self Care | Payer: Medicaid Other | Attending: Oral Surgery | Admitting: Oral Surgery

## 2020-09-24 ENCOUNTER — Ambulatory Visit (HOSPITAL_COMMUNITY): Payer: Medicaid Other | Admitting: Anesthesiology

## 2020-09-24 ENCOUNTER — Other Ambulatory Visit: Payer: Self-pay

## 2020-09-24 ENCOUNTER — Encounter (HOSPITAL_COMMUNITY): Payer: Self-pay | Admitting: Oral Surgery

## 2020-09-24 ENCOUNTER — Encounter (HOSPITAL_COMMUNITY)
Admission: RE | Disposition: A | Discharge status: Home / Self Care | Payer: Self-pay | Source: Home / Self Care | Attending: Oral Surgery

## 2020-09-24 DIAGNOSIS — K029 Dental caries, unspecified: Secondary | ICD-10-CM | POA: Diagnosis not present

## 2020-09-24 DIAGNOSIS — Z79899 Other long term (current) drug therapy: Secondary | ICD-10-CM | POA: Insufficient documentation

## 2020-09-24 DIAGNOSIS — Z7982 Long term (current) use of aspirin: Secondary | ICD-10-CM | POA: Insufficient documentation

## 2020-09-24 DIAGNOSIS — K011 Impacted teeth: Secondary | ICD-10-CM | POA: Insufficient documentation

## 2020-09-24 DIAGNOSIS — Z7984 Long term (current) use of oral hypoglycemic drugs: Secondary | ICD-10-CM | POA: Diagnosis not present

## 2020-09-24 DIAGNOSIS — Z794 Long term (current) use of insulin: Secondary | ICD-10-CM | POA: Diagnosis not present

## 2020-09-24 DIAGNOSIS — Z8541 Personal history of malignant neoplasm of cervix uteri: Secondary | ICD-10-CM | POA: Insufficient documentation

## 2020-09-24 DIAGNOSIS — Z888 Allergy status to other drugs, medicaments and biological substances status: Secondary | ICD-10-CM | POA: Diagnosis not present

## 2020-09-24 HISTORY — DX: Gastro-esophageal reflux disease without esophagitis: K21.9

## 2020-09-24 HISTORY — PX: TOOTH EXTRACTION: SHX859

## 2020-09-24 LAB — GLUCOSE, CAPILLARY
Glucose-Capillary: 196 mg/dL — ABNORMAL HIGH (ref 70–99)
Glucose-Capillary: 182 mg/dL — ABNORMAL HIGH (ref 70–99)

## 2020-09-24 LAB — CBC
HCT: 38 % (ref 36.0–46.0)
Hemoglobin: 12.1 g/dL (ref 12.0–15.0)
MCH: 28.3 pg (ref 26.0–34.0)
MCHC: 31.8 g/dL (ref 30.0–36.0)
MCV: 89 fL (ref 80.0–100.0)
Platelets: 245 10*3/uL (ref 150–400)
RBC: 4.27 MIL/uL (ref 3.87–5.11)
RDW: 13.2 % (ref 11.5–15.5)
WBC: 9.3 10*3/uL (ref 4.0–10.5)
nRBC: 0 % (ref 0.0–0.2)

## 2020-09-24 SURGERY — DENTAL RESTORATION/EXTRACTIONS
Anesthesia: General | Laterality: Bilateral

## 2020-09-24 MED ORDER — AMOXICILLIN 500 MG PO CAPS
500.0000 mg | ORAL_CAPSULE | Freq: Three times a day (TID) | ORAL | 0 refills | Status: DC
Start: 2020-09-24 — End: 2021-01-26

## 2020-09-24 MED ORDER — LACTATED RINGERS IV SOLN: INTRAVENOUS | Status: DC

## 2020-09-24 MED ORDER — MIDAZOLAM HCL 2 MG/2ML IJ SOLN
INTRAMUSCULAR | Status: AC
  Filled 2020-09-24: qty 2

## 2020-09-24 MED ORDER — LIDOCAINE-EPINEPHRINE 2 %-1:100000 IJ SOLN
INTRAMUSCULAR | Status: AC
  Filled 2020-09-24: qty 1

## 2020-09-24 MED ORDER — ONDANSETRON HCL 4 MG/2ML IJ SOLN
INTRAMUSCULAR | Status: DC | PRN
  Administered 2020-09-24: 4 mg via INTRAVENOUS

## 2020-09-24 MED ORDER — ORAL CARE MOUTH RINSE: 15.0000 mL | Freq: Once | OROMUCOSAL | Status: AC

## 2020-09-24 MED ORDER — MIDAZOLAM HCL 5 MG/5ML IJ SOLN
INTRAMUSCULAR | Status: DC | PRN
  Administered 2020-09-24: 2 mg via INTRAVENOUS

## 2020-09-24 MED ORDER — OXYMETAZOLINE HCL 0.05 % NA SOLN
NASAL | Status: DC | PRN
  Administered 2020-09-24: 1 via TOPICAL

## 2020-09-24 MED ORDER — FENTANYL CITRATE (PF) 100 MCG/2ML IJ SOLN: 25.0000 ug | INTRAMUSCULAR | Status: DC | PRN

## 2020-09-24 MED ORDER — CHLORHEXIDINE GLUCONATE 0.12 % MT SOLN
15.0000 mL | Freq: Once | OROMUCOSAL | Status: AC
  Administered 2020-09-24: 15 mL via OROMUCOSAL
  Filled 2020-09-24: qty 15

## 2020-09-24 MED ORDER — LIDOCAINE HCL (CARDIAC) PF 100 MG/5ML IV SOSY
PREFILLED_SYRINGE | INTRAVENOUS | Status: DC | PRN
  Administered 2020-09-24: 50 mg via INTRAVENOUS

## 2020-09-24 MED ORDER — PROPOFOL 10 MG/ML IV BOLUS
INTRAVENOUS | Status: AC
  Filled 2020-09-24: qty 20

## 2020-09-24 MED ORDER — CEFAZOLIN SODIUM 1 G IJ SOLR
INTRAMUSCULAR | Status: AC
  Filled 2020-09-24: qty 10

## 2020-09-24 MED ORDER — CEFAZOLIN IN SODIUM CHLORIDE 3-0.9 GM/100ML-% IV SOLN
3.0000 g | INTRAVENOUS | Status: AC
  Administered 2020-09-24: 3 g via INTRAVENOUS
  Filled 2020-09-24: qty 100

## 2020-09-24 MED ORDER — PHENYLEPHRINE HCL (PRESSORS) 10 MG/ML IV SOLN
INTRAVENOUS | Status: DC | PRN
  Administered 2020-09-24 (×4): 100 ug via INTRAVENOUS

## 2020-09-24 MED ORDER — OXYMETAZOLINE HCL 0.05 % NA SOLN
NASAL | Status: AC
  Filled 2020-09-24: qty 30

## 2020-09-24 MED ORDER — OXYCODONE HCL 5 MG PO TABS
5.0000 mg | ORAL_TABLET | Freq: Once | ORAL | Status: DC | PRN
Start: 2020-09-24 — End: 2020-09-24

## 2020-09-24 MED ORDER — FENTANYL CITRATE (PF) 100 MCG/2ML IJ SOLN
INTRAMUSCULAR | Status: DC | PRN
  Administered 2020-09-24: 100 ug via INTRAVENOUS
  Administered 2020-09-24: 50 ug via INTRAVENOUS
  Administered 2020-09-24: 100 ug via INTRAVENOUS

## 2020-09-24 MED ORDER — OXYCODONE-ACETAMINOPHEN 5-325 MG PO TABS: 1.0000 | ORAL_TABLET | ORAL | 0 refills | Status: DC | PRN

## 2020-09-24 MED ORDER — SODIUM CHLORIDE 0.9 % IR SOLN
Status: DC | PRN
  Administered 2020-09-24: 1000 mL

## 2020-09-24 MED ORDER — FENTANYL CITRATE (PF) 250 MCG/5ML IJ SOLN
INTRAMUSCULAR | Status: AC
  Filled 2020-09-24: qty 5

## 2020-09-24 MED ORDER — ONDANSETRON HCL 4 MG/2ML IJ SOLN
INTRAMUSCULAR | Status: AC
  Filled 2020-09-24: qty 2

## 2020-09-24 MED ORDER — OXYCODONE HCL 5 MG/5ML PO SOLN: 5.0000 mg | Freq: Once | ORAL | Status: DC | PRN

## 2020-09-24 MED ORDER — ROCURONIUM BROMIDE 100 MG/10ML IV SOLN
INTRAVENOUS | Status: DC | PRN
  Administered 2020-09-24: 50 mg via INTRAVENOUS

## 2020-09-24 MED ORDER — LIDOCAINE-EPINEPHRINE 2 %-1:100000 IJ SOLN
INTRAMUSCULAR | Status: DC | PRN
  Administered 2020-09-24: 10 mL

## 2020-09-24 MED ORDER — 0.9 % SODIUM CHLORIDE (POUR BTL) OPTIME
TOPICAL | Status: DC | PRN
  Administered 2020-09-24: 1000 mL

## 2020-09-24 MED ORDER — PHENYLEPHRINE 40 MCG/ML (10ML) SYRINGE FOR IV PUSH (FOR BLOOD PRESSURE SUPPORT)
PREFILLED_SYRINGE | INTRAVENOUS | Status: AC
  Filled 2020-09-24: qty 10

## 2020-09-24 MED ORDER — ONDANSETRON HCL 4 MG/2ML IJ SOLN: 4.0000 mg | Freq: Four times a day (QID) | INTRAMUSCULAR | Status: DC | PRN

## 2020-09-24 MED ORDER — PROPOFOL 10 MG/ML IV BOLUS
INTRAVENOUS | Status: DC | PRN
  Administered 2020-09-24: 180 mg via INTRAVENOUS

## 2020-09-24 SURGICAL SUPPLY — 37 items
BAG COUNTER SPONGE SURGICOUNT (BAG) IMPLANT
BAG SPNG CNTER NS LX DISP (BAG)
BLADE SURG 15 STRL LF DISP TIS (BLADE) ×1 IMPLANT
BLADE SURG 15 STRL SS (BLADE) ×2
BUR CROSS CUT FISSURE 1.6 (BURR) ×2 IMPLANT
BUR EGG ELITE 4.0 (BURR) ×2 IMPLANT
CANISTER SUCT 3000ML PPV (MISCELLANEOUS) ×2 IMPLANT
COVER SURGICAL LIGHT HANDLE (MISCELLANEOUS) ×2 IMPLANT
DECANTER SPIKE VIAL GLASS SM (MISCELLANEOUS) ×2 IMPLANT
DRAPE U-SHAPE 76X120 STRL (DRAPES) ×2 IMPLANT
GAUZE PACKING FOLDED 2  STR (GAUZE/BANDAGES/DRESSINGS) ×2
GAUZE PACKING FOLDED 2 STR (GAUZE/BANDAGES/DRESSINGS) ×1 IMPLANT
GLOVE SURG ENC MOIS LTX SZ6.5 (GLOVE) IMPLANT
GLOVE SURG ENC MOIS LTX SZ7 (GLOVE) IMPLANT
GLOVE SURG ENC MOIS LTX SZ8 (GLOVE) ×2 IMPLANT
GLOVE SURG UNDER POLY LF SZ6.5 (GLOVE) IMPLANT
GLOVE SURG UNDER POLY LF SZ7 (GLOVE) IMPLANT
GOWN STRL REUS W/ TWL LRG LVL3 (GOWN DISPOSABLE) ×1 IMPLANT
GOWN STRL REUS W/ TWL XL LVL3 (GOWN DISPOSABLE) ×1 IMPLANT
GOWN STRL REUS W/TWL LRG LVL3 (GOWN DISPOSABLE) ×2
GOWN STRL REUS W/TWL XL LVL3 (GOWN DISPOSABLE) ×2
IV NS 1000ML (IV SOLUTION) ×2
IV NS 1000ML BAXH (IV SOLUTION) ×1 IMPLANT
KIT BASIN OR (CUSTOM PROCEDURE TRAY) ×2 IMPLANT
KIT TURNOVER KIT B (KITS) ×2 IMPLANT
NDL HYPO 25GX1X1/2 BEV (NEEDLE) ×2 IMPLANT
NEEDLE HYPO 25GX1X1/2 BEV (NEEDLE) ×4 IMPLANT
NS IRRIG 1000ML POUR BTL (IV SOLUTION) ×2 IMPLANT
PAD ARMBOARD 7.5X6 YLW CONV (MISCELLANEOUS) ×2 IMPLANT
SLEEVE IRRIGATION ELITE 7 (MISCELLANEOUS) ×2 IMPLANT
SPONGE SURGIFOAM ABS GEL 12-7 (HEMOSTASIS) IMPLANT
SUT CHROMIC 3 0 PS 2 (SUTURE) ×2 IMPLANT
SYR BULB IRRIG 60ML STRL (SYRINGE) ×2 IMPLANT
SYR CONTROL 10ML LL (SYRINGE) ×2 IMPLANT
TRAY ENT MC OR (CUSTOM PROCEDURE TRAY) ×2 IMPLANT
TUBING IRRIGATION (MISCELLANEOUS) ×2 IMPLANT
YANKAUER SUCT BULB TIP NO VENT (SUCTIONS) ×2 IMPLANT

## 2020-09-24 NOTE — Anesthesia Postprocedure Evaluation (Signed)
Anesthesia Post Note  Patient: Tara Keller  Procedure(s) Performed: DENTAL RESTORATION/EXTRACTIONS (Bilateral)     Patient location during evaluation: PACU Anesthesia Type: General Level of consciousness: awake and alert Pain management: pain level controlled Vital Signs Assessment: post-procedure vital signs reviewed and stable Respiratory status: spontaneous breathing, nonlabored ventilation, respiratory function stable and patient connected to nasal cannula oxygen Cardiovascular status: blood pressure returned to baseline and stable Postop Assessment: no apparent nausea or vomiting Anesthetic complications: no   No notable events documented.  Last Vitals:  Vitals:   09/24/20 0940 09/24/20 0955  BP: (!) 161/68 (!) 149/81  Pulse: 96 82  Resp: 18 14  Temp: 36.6 C (!) 36.4 C  SpO2: 95% 96%    Last Pain:  Vitals:   09/24/20 0955  TempSrc:   PainSc: [place]

## 2020-09-24 NOTE — Progress Notes (Signed)
Patient did not take beta blocker day of surgery.  HR 66.  BP 129/71.  Per Dr. Marcie Bal, beta blocker not needed at this time.

## 2020-09-24 NOTE — Op Note (Signed)
09/24/2020  9:27 AM  PATIENT:  Tara Keller  55 y.o. female  PRE-OPERATIVE DIAGNOSIS:  IMPACTED TOOTH #17, NON-RESTORABLE TEETH # 18, 19 SECONDARY TO DENTAL CARIES  POST-OPERATIVE DIAGNOSIS:  SAME  PROCEDURE:  Procedure(s): /EXTRACTIONS TEETH 17, 18, 19  SURGEON:  Surgeon(s): Diona Browner, DMD  ANESTHESIA:   local and general  EBL:  minimal  DRAINS: none   SPECIMEN:  No Specimen  COUNTS:  YES  PLAN OF CARE: Discharge to home after PACU  PATIENT DISPOSITION:  PACU - hemodynamically stable.   PROCEDURE DETAILS: Dictation # [id number]  Gae Bon, DMD 09/24/2020 9:27 AM

## 2020-09-24 NOTE — H&P (Signed)
H&P documentation  -History and Physical Reviewed  -Patient has been re-examined  -No change in the plan of care  Tara Keller  

## 2020-09-24 NOTE — Transfer of Care (Signed)
Immediate Anesthesia Transfer of Care Note  Patient: Tara Keller  Procedure(s) Performed: DENTAL RESTORATION/EXTRACTIONS (Bilateral)  Patient Location: PACU  Anesthesia Type:General  Level of Consciousness: awake, alert , oriented and patient cooperative  Airway & Oxygen Therapy: Patient Spontanous Breathing and Patient connected to face mask oxygen  Post-op Assessment: Report given to RN, Post -op Vital signs reviewed and stable and Patient moving all extremities X 4  Post vital signs: Reviewed and stable  Last Vitals:  Vitals Value Taken Time  BP 161/68 09/24/20 0938  Temp    Pulse 96 09/24/20 0940  Resp 18 09/24/20 0940  SpO2 95 % 09/24/20 0940  Vitals shown include unvalidated device data.  Last Pain:  Vitals:   09/24/20 0836  TempSrc:   PainSc: 0-No pain      Patients Stated Pain Goal: 3 ([id number])  Complications: No notable events documented.

## 2020-09-24 NOTE — Anesthesia Procedure Notes (Signed)
Procedure Name: Intubation Date/Time: 09/24/2020 9:15 AM Performed by: Jonna Munro, CRNA Pre-anesthesia Checklist: Patient identified, Emergency Drugs available, Suction available, Patient being monitored and Timeout performed Patient Re-evaluated:Patient Re-evaluated prior to induction Oxygen Delivery Method: Circle system utilized Preoxygenation: Pre-oxygenation with 100% oxygen Induction Type: IV induction Ventilation: Mask ventilation without difficulty Laryngoscope Size: Mac and 3 Grade View: Grade I Nasal Tubes: Magill forceps- large, utilized, Left, Nasal prep performed and Nasal Rae Tube size: 7.0 mm Number of attempts: 1 Placement Confirmation: ETT inserted through vocal cords under direct vision, positive ETCO2 and breath sounds checked- equal and bilateral Secured at: 24 cm Tube secured with: Tape Dental Injury: Teeth and Oropharynx as per pre-operative assessment

## 2020-09-24 NOTE — Anesthesia Preprocedure Evaluation (Signed)
Anesthesia Evaluation  Patient identified by MRN, date of birth, ID band Patient awake    Reviewed: Allergy & Precautions, H&P , NPO status , Patient's Chart, lab work & pertinent test results  Airway Mallampati: II   Neck ROM: full    Dental   Pulmonary asthma , sleep apnea , Current Smoker and Patient abstained from smoking.,    breath sounds clear to auscultation       Cardiovascular hypertension,  Rhythm:regular Rate:Normal     Neuro/Psych PSYCHIATRIC DISORDERS Anxiety Depression  Neuromuscular disease    GI/Hepatic hiatal hernia, GERD  ,  Endo/Other  diabetes, Type 2Morbid obesity  Renal/GU      Musculoskeletal  (+) Arthritis ,   Abdominal   Peds  Hematology   Anesthesia Other Findings   Reproductive/Obstetrics                             Anesthesia Physical Anesthesia Plan  ASA: 3  Anesthesia Plan: General   Post-op Pain Management:    Induction: Intravenous  PONV Risk Score and Plan: 2 and Ondansetron, Dexamethasone and Treatment may vary due to age or medical condition  Airway Management Planned: Nasal ETT  Additional Equipment:   Intra-op Plan:   Post-operative Plan: Extubation in OR  Informed Consent: I have reviewed the patients History and Physical, chart, labs and discussed the procedure including the risks, benefits and alternatives for the proposed anesthesia with the patient or authorized representative who has indicated his/her understanding and acceptance.     Dental advisory given  Plan Discussed with: CRNA, Anesthesiologist and Surgeon  Anesthesia Plan Comments:         Anesthesia Quick Evaluation

## 2020-09-24 NOTE — Op Note (Signed)
NAME: Tara Keller, Tara Keller MEDICAL RECORD NO: [id number] ACCOUNT NO: [id number] DATE OF BIRTH: 29-Mar-1965 FACILITY: [facility] LOCATION: [facility] PHYSICIAN: Gae Bon, DDS  Operative Report   DATE OF PROCEDURE: 09/24/2020  PREOPERATIVE DIAGNOSIS:  Impacted tooth #17, nonrestorable teeth 18 and 19 secondary to dental caries.  POSTOPERATIVE DIAGNOSIS:  Impacted tooth #17, nonrestorable teeth 18 and 19 secondary to dental caries.  PROCEDURE:  Extraction teeth 17, 18, 19.  SURGEON:  Gae Bon, DDS.  ANESTHESIA:  Dr. Marcie Bal, nasal intubation.  DESCRIPTION OF PROCEDURE:  The patient was taken to the operating room and placed on the table in supine position.  General anesthesia was administered.  A nasal endotracheal tube was placed and secured and the eyes were protected.  The patient was  draped for surgery.  Timeout was performed.  The posterior pharynx was suctioned and a throat pack was placed.  2% lidocaine 1:100,000 epinephrine was infiltrated in the inferior alveolar block on the left side and buccal infiltration in the lateral  mandible along teeth #18 and #19.  A bite block was placed on the right side of the mouth, a sweetheart retractor was used to retract the tongue.  A #15 blade was used to make a hockey stick incision overlying tooth #17 and it was carried forward in the  gingival sulcus to tooth #20 on the buccal aspect.  Then, another incision was made in the lingual sulcus of teeth numbers 18 and 19.  The periosteum was reflected.  Tooth #18 was extracted first using a 301 elevator and dental forceps. Tooth #19 was  mobilized slightly, but needed to be sectioned with the Stryker handpiece and fissure bur under irrigation and the roots were removed individually with a 301 elevator in order to extract it.  Then, attention was turned to #17, which was completely  impacted within the bone.  A Stryker handpiece with a fissure bur under irrigation was used to unroof the crown of the  tooth #17.  The crown was sectioned in multiple pieces and the roots were sectioned in multiple pieces and the tooth was removed with a  301 elevator.  The sockets were all then curetted.  The inferior alveolar nerve was seen coursing through the inferior canal through the lower portion of the socket of tooth #17, but there was no evidence of any damage or excessive bleeding.  Then, the  sockets were curetted, irrigated and closed with 3-0 chromic.  The oral cavity was irrigated and suctioned.  The throat pack was removed.  The patient was left under the care of anesthesia for extubation with plans for discharge home after recovery room.  ESTIMATED BLOOD LOSS:  Minimum.  COMPLICATIONS:  None.  SPECIMENS:  None.   PUS D: 09/24/2020 9:30:45 am T: 09/24/2020 11:02:00 am  JOB: [id number]

## 2020-09-25 ENCOUNTER — Encounter (HOSPITAL_COMMUNITY): Payer: Self-pay | Admitting: Oral Surgery

## 2020-09-28 ENCOUNTER — Ambulatory Visit (INDEPENDENT_AMBULATORY_CARE_PROVIDER_SITE_OTHER): Payer: Medicaid Other | Admitting: Podiatry

## 2020-09-28 ENCOUNTER — Encounter: Payer: Self-pay | Admitting: Podiatry

## 2020-09-28 ENCOUNTER — Other Ambulatory Visit: Payer: Self-pay

## 2020-09-28 DIAGNOSIS — M79674 Pain in right toe(s): Secondary | ICD-10-CM | POA: Diagnosis not present

## 2020-09-28 DIAGNOSIS — M79675 Pain in left toe(s): Secondary | ICD-10-CM | POA: Diagnosis not present

## 2020-09-28 DIAGNOSIS — E1149 Type 2 diabetes mellitus with other diabetic neurological complication: Secondary | ICD-10-CM

## 2020-09-28 DIAGNOSIS — B351 Tinea unguium: Secondary | ICD-10-CM | POA: Diagnosis not present

## 2020-09-28 NOTE — Telephone Encounter (Signed)
Received notification from [facility] regarding a prior authorization for [place]. Authorization has been APPROVED from 09/28/20 to 09/23/2021.   Received notification from [facility] regarding a prior authorization for DEXCOM G6 RECEIVER. Authorization has been APPROVED from 09/28/20 to 10/28/2020.    Authorization # [id number] Authorization # [id number]

## 2020-09-28 NOTE — Telephone Encounter (Signed)
Vm left for patient to let her know that Dexcom has been approved

## 2020-09-28 NOTE — Progress Notes (Signed)
Complaint:  Visit Type: Patient returns to my office for continued preventative foot care services. Complaint: Patient states" my nails have grown long and thick and become painful to walk and wear shoes" Patient has been diagnosed with DM with neuropathy. The patient presents for preventative foot care services.  Podiatric Exam: Vascular: dorsalis pedis and posterior tibial pulses are palpable bilateral. Capillary return is immediate. Temperature gradient is WNL. Skin turgor WNL  Sensorium: Diminished  Semmes Weinstein monofilament test. Normal tactile sensation bilaterally. Nail Exam: Pt has thick disfigured discolored nails with subungual debris noted bilateral entire nail hallux through fifth toenails Ulcer Exam: There is no evidence of ulcer or pre-ulcerative changes or infection. Orthopedic Exam: Muscle tone and strength are WNL. No limitations in general ROM. No crepitus or effusions noted. Foot type and digits show no abnormalities. HAV  B/L. Skin: No Porokeratosis. No infection or ulcers  Diagnosis:  Onychomycosis, , Pain in right toe, pain in left toes  Treatment & Plan Procedures and Treatment: Consent by patient was obtained for treatment procedures.   Debridement of mycotic and hypertrophic toenails, 1 through 5 bilateral and clearing of subungual debris. No ulceration, no infection noted.  Return Visit-Office Procedure: Patient instructed to return to the office for a follow up visit 3 months for continued evaluation and treatment.    Maan Zarcone DPM 

## 2020-09-29 ENCOUNTER — Encounter: Payer: Self-pay | Admitting: Physical Therapy

## 2020-09-29 ENCOUNTER — Ambulatory Visit: Payer: Medicaid Other | Admitting: Physical Therapy

## 2020-09-29 DIAGNOSIS — R262 Difficulty in walking, not elsewhere classified: Secondary | ICD-10-CM

## 2020-09-29 DIAGNOSIS — M79604 Pain in right leg: Secondary | ICD-10-CM

## 2020-09-29 DIAGNOSIS — R2689 Other abnormalities of gait and mobility: Secondary | ICD-10-CM

## 2020-09-29 DIAGNOSIS — M79651 Pain in right thigh: Secondary | ICD-10-CM

## 2020-09-29 DIAGNOSIS — M25551 Pain in right hip: Secondary | ICD-10-CM
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
## 2020-10-01 ENCOUNTER — Other Ambulatory Visit: Payer: Self-pay

## 2020-10-01 ENCOUNTER — Encounter: Payer: Self-pay | Admitting: Physical Therapy

## 2020-10-01 ENCOUNTER — Ambulatory Visit: Payer: Medicaid Other | Admitting: Physical Therapy

## 2020-10-01 DIAGNOSIS — M79604 Pain in right leg: Secondary | ICD-10-CM | POA: Diagnosis not present

## 2020-10-01 DIAGNOSIS — R262 Difficulty in walking, not elsewhere classified: Secondary | ICD-10-CM

## 2020-10-01 DIAGNOSIS — R2689 Other abnormalities of gait and mobility: Secondary | ICD-10-CM
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
## 2020-10-05 ENCOUNTER — Ambulatory Visit: Payer: Medicaid Other

## 2020-10-07 ENCOUNTER — Ambulatory Visit: Payer: Medicaid Other

## 2020-10-12 ENCOUNTER — Ambulatory Visit: Payer: Medicaid Other

## 2020-10-19 ENCOUNTER — Ambulatory Visit: Payer: Medicaid Other

## 2020-10-21 ENCOUNTER — Ambulatory Visit: Payer: Medicaid Other

## 2020-10-25 ENCOUNTER — Encounter: Payer: Self-pay | Admitting: Family Medicine

## 2020-10-25 ENCOUNTER — Ambulatory Visit (INDEPENDENT_AMBULATORY_CARE_PROVIDER_SITE_OTHER): Payer: Medicaid Other | Admitting: Family Medicine

## 2020-10-25 ENCOUNTER — Ambulatory Visit: Payer: Medicaid Other | Admitting: Family

## 2020-10-25 ENCOUNTER — Other Ambulatory Visit: Payer: Self-pay

## 2020-10-25 VITALS — BP 110/75 | HR 99 | Temp 98.3°F | Resp 16 | Ht 66.0 in | Wt 249.8 lb

## 2020-10-25 DIAGNOSIS — E1169 Type 2 diabetes mellitus with other specified complication: Secondary | ICD-10-CM | POA: Diagnosis not present

## 2020-10-25 DIAGNOSIS — M5431 Sciatica, right side: Secondary | ICD-10-CM | POA: Diagnosis not present

## 2020-10-25 DIAGNOSIS — I10 Essential (primary) hypertension: Secondary | ICD-10-CM

## 2020-10-25 DIAGNOSIS — Z7689 Persons encountering health services in other specified circumstances: Secondary | ICD-10-CM

## 2020-10-25 DIAGNOSIS — Z794 Long term (current) use of insulin: Secondary | ICD-10-CM

## 2020-10-25 MED ORDER — LISINOPRIL 10 MG PO TABS: 10.0000 mg | ORAL_TABLET | Freq: Every day | ORAL | 1 refills | Status: DC

## 2020-10-25 MED ORDER — ACCU-CHEK GUIDE VI STRP: ORAL_STRIP | 12 refills | Status: DC

## 2020-10-25 MED ORDER — TIZANIDINE HCL 4 MG PO CAPS: 4.0000 mg | ORAL_CAPSULE | Freq: Three times a day (TID) | ORAL | 1 refills | Status: DC | PRN

## 2020-10-25 NOTE — Progress Notes (Signed)
Patient is here to est care  Patient said that she is having right side pain that she has seen a therapist about . Patient said that she still need medication for muscle relaxer.

## 2020-10-26 ENCOUNTER — Encounter: Payer: Self-pay | Admitting: Family Medicine

## 2020-10-26 ENCOUNTER — Ambulatory Visit: Payer: Medicaid Other | Attending: Internal Medicine

## 2020-10-26 DIAGNOSIS — R262 Difficulty in walking, not elsewhere classified: Secondary | ICD-10-CM | POA: Diagnosis present

## 2020-10-26 DIAGNOSIS — R2689 Other abnormalities of gait and mobility: Secondary | ICD-10-CM | POA: Insufficient documentation

## 2020-10-26 DIAGNOSIS — M79604 Pain in right leg: Secondary | ICD-10-CM | POA: Insufficient documentation
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
## 2020-10-28 ENCOUNTER — Ambulatory Visit (INDEPENDENT_AMBULATORY_CARE_PROVIDER_SITE_OTHER): Payer: Medicaid Other | Admitting: Family Medicine

## 2020-10-28 ENCOUNTER — Other Ambulatory Visit: Payer: Self-pay

## 2020-10-28 ENCOUNTER — Ambulatory Visit: Payer: Medicaid Other

## 2020-10-28 ENCOUNTER — Encounter: Payer: Self-pay | Admitting: Family Medicine

## 2020-10-28 VITALS — BP 138/79 | HR 80 | Temp 97.9°F | Resp 16 | Wt 249.2 lb

## 2020-10-28 DIAGNOSIS — M5416 Radiculopathy, lumbar region: Secondary | ICD-10-CM | POA: Diagnosis not present

## 2020-10-28 DIAGNOSIS — M25551 Pain in right hip: Secondary | ICD-10-CM

## 2020-10-28 MED ORDER — TRIAMCINOLONE ACETONIDE 40 MG/ML IJ SUSP
40.0000 mg | Freq: Once | INTRAMUSCULAR | Status: AC
  Administered 2020-10-28: 40 mg via INTRAMUSCULAR

## 2020-10-28 MED ORDER — TRAMADOL HCL 50 MG PO TABS: 50.0000 mg | ORAL_TABLET | Freq: Four times a day (QID) | ORAL | 0 refills | Status: AC | PRN

## 2020-10-28 MED ORDER — GABAPENTIN 300 MG PO CAPS: 300.0000 mg | ORAL_CAPSULE | Freq: Three times a day (TID) | ORAL | 1 refills | Status: DC
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
## 2020-10-28 NOTE — Progress Notes (Signed)
Patient is here because she is wanting an [name] of her left leg to make sure there are no clots.  Patient said she is not able to sleep at night and medications are not helping

## 2020-11-02 ENCOUNTER — Telehealth: Payer: Self-pay | Admitting: Internal Medicine

## 2020-11-02 ENCOUNTER — Ambulatory Visit: Payer: Medicaid Other

## 2020-11-02 NOTE — Telephone Encounter (Signed)
Pt reports she started having abdominal pain about 4 days ago. She feels is may be her pancreas but she does not want to wind up having to go to the hospital. Requesting an OV. Pt scheduled to see Amy Esterwood PA 11/04/20@1 :30pm. Pt knows to go to the ER if she worsens. Pt aware of appt.

## 2020-11-02 NOTE — Telephone Encounter (Signed)
Patient called stating she was having extreme abdominal pain and issues with her pancreas.  She'd like to be seen sooner rather than later, and there are no follow-up appointments available at this time.  Please call and advise.

## 2020-11-04 ENCOUNTER — Encounter: Payer: Self-pay | Admitting: Orthopaedic Surgery

## 2020-11-04 ENCOUNTER — Other Ambulatory Visit (INDEPENDENT_AMBULATORY_CARE_PROVIDER_SITE_OTHER): Payer: Medicaid Other

## 2020-11-04 ENCOUNTER — Ambulatory Visit: Payer: Self-pay

## 2020-11-04 ENCOUNTER — Other Ambulatory Visit: Payer: Self-pay

## 2020-11-04 ENCOUNTER — Encounter: Payer: Medicaid Other | Admitting: Physical Therapy

## 2020-11-04 ENCOUNTER — Ambulatory Visit (INDEPENDENT_AMBULATORY_CARE_PROVIDER_SITE_OTHER): Payer: Medicaid Other | Admitting: Physician Assistant

## 2020-11-04 ENCOUNTER — Encounter: Payer: Self-pay | Admitting: Physician Assistant

## 2020-11-04 ENCOUNTER — Ambulatory Visit (INDEPENDENT_AMBULATORY_CARE_PROVIDER_SITE_OTHER): Payer: Medicaid Other | Admitting: Orthopaedic Surgery

## 2020-11-04 VITALS — BP 104/70 | HR 85 | Ht 66.0 in | Wt 252.2 lb

## 2020-11-04 DIAGNOSIS — R112 Nausea with vomiting, unspecified: Secondary | ICD-10-CM

## 2020-11-04 DIAGNOSIS — R1084 Generalized abdominal pain: Secondary | ICD-10-CM

## 2020-11-04 DIAGNOSIS — M545 Low back pain, unspecified: Secondary | ICD-10-CM | POA: Diagnosis not present

## 2020-11-04 DIAGNOSIS — Z8719 Personal history of other diseases of the digestive system: Secondary | ICD-10-CM | POA: Diagnosis not present

## 2020-11-04 LAB — CBC WITH DIFFERENTIAL/PLATELET
Basophils Absolute: 0.1 K/uL (ref 0.0–0.1)
Basophils Relative: 0.9 % (ref 0.0–3.0)
Eosinophils Absolute: 0.3 K/uL (ref 0.0–0.7)
Eosinophils Relative: 2.4 % (ref 0.0–5.0)
HCT: 41.1 % (ref 36.0–46.0)
Hemoglobin: 13.6 g/dL (ref 12.0–15.0)
Lymphocytes Relative: 43.5 % (ref 12.0–46.0)
Lymphs Abs: 4.9 K/uL — ABNORMAL HIGH (ref 0.7–4.0)
MCHC: 33 g/dL (ref 30.0–36.0)
MCV: 83.9 fl (ref 78.0–100.0)
Monocytes Absolute: 0.9 K/uL (ref 0.1–1.0)
Monocytes Relative: 7.9 % (ref 3.0–12.0)
Neutro Abs: 5.2 K/uL (ref 1.4–7.7)
Neutrophils Relative %: 45.3 % (ref 43.0–77.0)
Platelets: 342 K/uL (ref 150.0–400.0)
RBC: 4.9 Mil/uL (ref 3.87–5.11)
RDW: 13.3 % (ref 11.5–15.5)
WBC: 11.4 K/uL — ABNORMAL HIGH (ref 4.0–10.5)

## 2020-11-04 LAB — COMPREHENSIVE METABOLIC PANEL
ALT: 12 U/L (ref 0–35)
AST: 10 U/L (ref 0–37)
Albumin: 4.2 g/dL (ref 3.5–5.2)
Alkaline Phosphatase: 187 U/L — ABNORMAL HIGH (ref 39–117)
BUN: 5 mg/dL — ABNORMAL LOW (ref 6–23)
CO2: 28 mEq/L (ref 19–32)
Calcium: 9.9 mg/dL (ref 8.4–10.5)
Chloride: 100 mEq/L (ref 96–112)
Creatinine, Ser: 0.81 mg/dL (ref 0.40–1.20)
GFR: 80.67 mL/min (ref >60.00)
Glucose, Bld: 126 mg/dL — ABNORMAL HIGH (ref 70–99)
Potassium: 3.9 mEq/L (ref 3.5–5.1)
Sodium: 136 mEq/L (ref 135–145)
Total Bilirubin: 0.3 mg/dL (ref 0.2–1.2)
Total Protein: 8.2 g/dL (ref 6.0–8.3)

## 2020-11-04 LAB — LIPASE: Lipase: 126 U/L — ABNORMAL HIGH (ref 11.0–59.0)

## 2020-11-04 MED ORDER — ONDANSETRON 4 MG PO TBDP: ORAL_TABLET | ORAL | 1 refills | Status: DC

## 2020-11-04 MED ORDER — TRAMADOL HCL 50 MG PO TABS: 50.0000 mg | ORAL_TABLET | Freq: Two times a day (BID) | ORAL | 2 refills | Status: DC | PRN

## 2020-11-04 MED ORDER — PANTOPRAZOLE SODIUM 40 MG PO TBEC: 40.0000 mg | DELAYED_RELEASE_TABLET | Freq: Two times a day (BID) | ORAL | 4 refills | Status: DC

## 2020-11-04 MED ORDER — MELOXICAM 7.5 MG PO TABS: 7.5000 mg | ORAL_TABLET | Freq: Every day | ORAL | 2 refills | Status: DC | PRN

## 2020-11-04 MED ORDER — METHOCARBAMOL 500 MG PO TABS: 500.0000 mg | ORAL_TABLET | Freq: Two times a day (BID) | ORAL | 0 refills | Status: DC | PRN

## 2020-11-04 NOTE — Patient Instructions (Signed)
If you are age 57 or younger, your body mass index should be between 19-25. Your Body mass index is 40.71 kg/m. If this is out of the aformentioned range listed, please consider follow up with your Primary Care Provider.  ________________________________________________________  The [facility] providers would like to encourage you to use [facility] to communicate with providers for non-urgent requests or questions.  Due to long hold times on the telephone, sending your provider a message by [facility] may be a faster and more efficient way to get a response.  Please allow 48 business hours for a response.  Please remember that this is for non-urgent requests.  _______________________________________________________  Tara Keller have been scheduled for a CT scan of the abdomen and pelvis at [facility], 1st floor Radiology. You are scheduled on 11/4/222  at 12:30. You should arrive 15 minutes prior to your appointment time for registration.  Please pick up 2 bottles of contrast from [facility] at least 3 days prior to your scan. The solution may taste better if refrigerated, but do NOT add ice or any other liquid to this solution. Shake well before drinking.   Please follow the written instructions below on the day of your exam:   1) Do not eat anything after 8:30 am (4 hours prior to your test)   2) Drink 1 bottle of contrast @ 10:30 am (2 hours prior to your exam)  Remember to shake well before drinking and do NOT pour over ice.     Drink 1 bottle of contrast @ 11:30 am (1 hour prior to your exam)   You may take any medications as prescribed with a small amount of water, if necessary. If you take any of the following medications: METFORMIN, GLUCOPHAGE, GLUCOVANCE, AVANDAMET, RIOMET, FORTAMET, [place] MET, JANUMET, GLUMETZA or METAGLIP, you MAY be asked to HOLD this medication 48 hours AFTER the exam.   The purpose of you drinking the oral contrast is to aid in the visualization of your intestinal  tract. The contrast solution may cause some diarrhea. Depending on your individual set of symptoms, you may also receive an intravenous injection of x-ray contrast/dye. Plan on being at [facility] for 45 minutes or longer, depending on the type of exam you are having performed.   If you have any questions regarding your exam or if you need to reschedule, you may call [facility] at [phone] between the hours of 8:00 am and 5:00 pm, Monday-Friday.   Continue Pantoprazole 40 mg twice daily Continue Ondansetron 1 tablet every 6 hours as needed  DO NOT take Meloxicam/Mobic  Follow a clear to full liquid diet as well as a bland diet. Push fluids  Follow up pending the results of your CT.  Thank you for entrusting me with your care and choosing [facility]  Amy Esterwood, PA-C

## 2020-11-04 NOTE — Progress Notes (Signed)
Subjective:    Patient ID: Tara Keller, female    DOB: Mar 19, 1965, 55 y.o.   MRN: [id number]  HPI Tara Keller is a pleasant 55 year old African-American female established with Dr. Hilarie Fredrickson who comes in today with complaints of 2-week history of upper abdominal pain which she describes as being "pretty bad".  She says she felt so bad initially she thought she would need to go to the emergency room but was trying to avoid that due to extensive weights etc.  She says this pain is reminiscent of prior episode of pancreatitis.  She did describes pain in the right upper quadrant and epigastrium primarily which she says initially was constant and is a little bit better over the past couple of days though she continues to have significant difficulty eating.  She says she is scared to eat because everything that she tries to eat immediately causes pain.  She is primarily been on liquids over the past week or so has solid food definitely aggravates the pain.  She describes this is a pressure and burning type of pain which has been associated with nausea and some episodes of vomiting.  No diarrhea melena or hematochezia.  No fever or chills. No EtOH use, the only new medication was a dosage change in [place].  She has also gotten a new prescription for Mobic but has not started that yet.  She has history of Barrett's esophagus and gastritis.  She underwent EGD in July 2021 with evidence of Barrett's esophagus, and a 3 cm hiatal hernia, normal-appearing stomach.  Biopsy showed reactive gastropathy negative for H. pylori and intestinal metaplasia and evidence of Barrett's without dysplasia.  At that time she was treated with twice daily PPI and Carafate. She also has history of hypertension, sleep apnea with CPAP use, hepatic steatosis, adult onset diabetes mellitus with neuropathy, obesity osteoarthritis and prior history of endometrial cancer.  She did have CT in October 2020 for acute abdominal pain and was found to  be status postcholecystectomy with mild fat stranding about the pancreatic head consistent with pancreatitis, no necrosis.  Colonoscopy August 2020 with removal of 4 small polyps, 2 of these were tubular adenomas and 2 were hyperplastic.  ROS:  Pertinent positive and negative review of systems were noted in the above HPI section.  All other review of systems was otherwise negative.   Outpatient Encounter Medications as of 11/04/2020  Medication Sig   Accu-Chek FastClix Lancets MISC USE TO TEST THREE TIMES DAILY   amoxicillin (AMOXIL) 500 MG capsule Take 1 capsule (500 mg total) by mouth 3 (three) times daily.   aspirin EC 81 MG tablet Take 1 tablet (81 mg total) by mouth daily.   atorvastatin (LIPITOR) 20 MG tablet Take 1 tablet (20 mg total) by mouth daily.   Blood Glucose Monitoring Suppl (ACCU-CHEK GUIDE ME) w/Device KIT 48 Units by Does not apply route 2 (two) times daily.   buPROPion (WELLBUTRIN XL) 300 MG 24 hr tablet Take 300 mg by mouth daily.    cetirizine (ZYRTEC) 10 MG tablet Take 1 tablet (10 mg total) by mouth daily.   Cholecalciferol (VITAMIN D) 50 MCG (2000 UT) tablet Take 2,000 Units by mouth daily.   Continuous Blood Gluc Sensor (DEXCOM G6 SENSOR) MISC 1 Device by Does not apply route as directed.   dapagliflozin propanediol (FARXIGA) 10 MG TABS tablet Take 1 tablet (10 mg total) by mouth daily before breakfast.   diclofenac Sodium (VOLTAREN) 1 % GEL Apply 4 g topically 4 (  four) times daily. (Patient taking differently: Apply 4 g topically daily as needed (Pain).)   fluticasone (FLONASE) 50 MCG/ACT nasal spray Place 1 spray into both nostrils daily as needed for rhinitis.   gabapentin (NEURONTIN) 300 MG capsule Take 1 capsule (300 mg total) by mouth 3 (three) times daily.   glucose blood (ACCU-CHEK GUIDE) test strip Three times a day   hydrOXYzine (ATARAX/VISTARIL) 10 MG tablet Take 10 mg by mouth 3 (three) times daily as needed for anxiety.   insulin aspart (NOVOLOG FLEXPEN)  100 UNIT/ML FlexPen Max daily 80 units   insulin glargine (LANTUS SOLOSTAR) 100 UNIT/ML Solostar Pen Inject 54 Units into the skin daily.   Insulin Pen Needle 31G X 5 MM MISC 1 Device by Does not apply route in the morning, at noon, in the evening, and at bedtime.   lisinopril (ZESTRIL) 10 MG tablet Take 1 tablet (10 mg total) by mouth daily.   Melatonin ER 5 MG TBCR Take 5 mg by mouth at bedtime.   meloxicam (MOBIC) 7.5 MG tablet Take 1 tablet (7.5 mg total) by mouth daily as needed for up to 14 doses for pain.   metFORMIN (GLUCOPHAGE-XR) 750 MG 24 hr tablet Take 1 tablet (750 mg total) by mouth 2 (two) times daily.   methocarbamol (ROBAXIN) 500 MG tablet Take 1 tablet (500 mg total) by mouth 2 (two) times daily as needed.   nicotine (NICODERM CQ - DOSED IN MG/24 HOURS) 14 mg/24hr patch Place 1 patch (14 mg total) onto the skin daily.   polyethylene glycol powder (GLYCOLAX/MIRALAX) 17 GM/SCOOP powder Take 17 g by mouth daily as needed for constipation.   potassium chloride SA (KLOR-CON) 20 MEQ tablet Take 1 tablet (20 mEq total) by mouth daily.   propranolol (INDERAL) 10 MG tablet Take 10 mg by mouth 2 (two) times daily as needed.   sucralfate (CARAFATE) 1 g tablet TAKE 1 TABLET(1 GRAM) BY MOUTH FOUR TIMES DAILY BEFORE MEALS AND AT BEDTIME   SURE COMFORT INSULIN SYRINGE 31G X 5/16" 1 ML MISC Use to inject insulin daily   tiZANidine (ZANAFLEX) 4 MG capsule Take 1 capsule (4 mg total) by mouth 3 (three) times daily as needed for muscle spasms.   traMADol (ULTRAM) 50 MG tablet Take 1 tablet (50 mg total) by mouth every 12 (twelve) hours as needed.   [DISCONTINUED] ondansetron (ZOFRAN ODT) 4 MG disintegrating tablet Twice a day as needed for nausea   [DISCONTINUED] pantoprazole (PROTONIX) 40 MG tablet Take 1 tablet (40 mg total) by mouth 2 (two) times daily.   DULoxetine (CYMBALTA) 30 MG capsule Take 1 capsule (30 mg total) by mouth daily for 7 days, THEN 2 capsules (60 mg total) daily. (Patient  taking differently: Take 1 capsule (20 mg total) by mouth daily for 7 days, THEN 2 capsules (60 mg total) daily.)   ondansetron (ZOFRAN ODT) 4 MG disintegrating tablet Take 1 tablet every 6 hours as needed for nausea   pantoprazole (PROTONIX) 40 MG tablet Take 1 tablet (40 mg total) by mouth 2 (two) times daily.   [DISCONTINUED] oxyCODONE-acetaminophen (PERCOCET) 5-325 MG tablet Take 1 tablet by mouth every 4 (four) hours as needed.   No facility-administered encounter medications on file as of 11/04/2020.   Allergies  Allergen Reactions   Cyclobenzaprine Itching    benadryl   Patient Active Problem List   Diagnosis Date Noted   Right hip pain 03/23/2020   Lymphadenopathy, submental 12/26/2019   Hypertension 10/23/2019   Type 2 diabetes  mellitus with diabetic polyneuropathy, with long-term current use of insulin ([place]) 10/08/2019   Dyslipidemia 10/08/2019   Barrett's esophagus 07/24/2019   Elevated alkaline phosphatase level 07/10/2019   Pain due to onychomycosis of toenails of both feet 03/05/2019   Chronic pansinusitis 02/28/2019   Endometrial ca ([place]) 09/10/2018   Lumbar radiculopathy 04/13/2017   Back pain 10/18/2016   Diabetic neuropathy ([place]) 09/15/2016   Polyarticular arthritis 09/15/2016   OSA on CPAP 07/06/2015   Depression 07/06/2015   Healthcare maintenance 08/15/2013   Hepatic steatosis 05/19/2013   GERD (gastroesophageal reflux disease) 02/18/2013   Tobacco use disorder 02/18/2013   Morbid obesity ([place]) 05/28/2012   Osteoarthritis of left and right knee 05/28/2012   Generalized anxiety disorder 05/28/2012   Social History   Socioeconomic History   Marital status: Single    Spouse name: Not on file   Number of children: 4   Years of education: Not on file   Highest education level: Not on file  Occupational History   Occupation: disabled  Tobacco Use   Smoking status: Every Day    Packs/day: 0.25    Types: Cigarettes   Smokeless tobacco: Never   Tobacco  comments:    started back smoking 09/02/18  Vaping Use   Vaping Use: Never used  Substance and Sexual Activity   Alcohol use: Not Currently    Alcohol/week: 0.0 standard drinks   Drug use: Not Currently    Types: Marijuana   Sexual activity: Not on file  Other Topics Concern   Not on file  Social History Narrative   She lives with fiance.  Her daughter died in Mar 03, 2015 at the age of 2.   She is on disability since 03-02-1990   Highest level of education:  Working on [employer]   Social Determinants of Radio broadcast assistant Strain: Not on file  Food Insecurity: Not on file  Transportation Needs: Not on file  Physical Activity: Not on file  Stress: Not on file  Social Connections: Not on file  Intimate Partner Violence: Not on file    Tara Keller's family history includes Breast cancer in her sister; Cirrhosis in her father; Colon cancer in her maternal uncle; Diabetes in her mother; Heart attack in her sister; Hypertension in her mother; Other in her daughter; Prostate cancer in her maternal uncle; Stomach cancer in her maternal aunt; Stroke in her mother.      Objective:    Vitals:   11/04/20 1324  BP: 104/70  Pulse: 85    Physical Exam  Well-developed well-nourished older African-American female in no acute distress.  Obese.  Pleasant, height, Weight, 252 BMI 40.7  HEENT; nontraumatic normocephalic, EOMI, PE R LA, sclera anicteric. Oropharynx; Neck; supple, no JVD Cardiovascular; regular rate and rhythm with S1-S2, no murmur rub or gallop Pulmonary; Clear bilaterally Abdomen; soft, she is tender in the epigastrium, hypogastrium and right upper quadrant no guarding or rebound, nondistended, no palpable mass or hepatosplenomegaly, bowel sounds are active Rectal; not done today Skin; benign exam, no jaundice rash or appreciable lesions Extremities; no clubbing cyanosis or edema skin warm and dry Neuro/Psych; alert and oriented x4, grossly nonfocal mood and affect appropriate         Assessment & Plan:   #22 55 year old female with 2-week history of epigastric/hypogastric and upper right upper quadrant pain worse postprandially and associated with intermittent nausea and vomiting.  Patient reports reminiscent of prior episode of pancreatitis Patient is status postcholecystectomy. Etiology of current symptoms is  not entirely clear, she may have recurrent mild pancreatitis, rule out gastropathy, peptic ulcer disease, infectious gastroenteritis  #2 status postcholecystectomy #3  History of Barrett's esophagus up-to-date with surveillance last EGD July 2021 #4 history of adenomatous colon polyps-up-to-date with last procedure 2020  #5 obesity #6.  Adult onset diabetes mellitus with neuropathy #7.  History of endometrial cancer #8.  Hypertension #9.  Sleep apnea with CPAP use #10 chronic GERD on twice daily PPI  Plan; continue clear liquid to full liquid diet, push fluids, we discussed food choices to increase calories.  With gradual advancement to full liquid to very soft bland CBC with differential, c-Met, lipase, Will be scheduled for CT scan of the abdomen and pelvis with contrast. She is advised not to start Mobic Start Zofran 4 mg every 6 to 8 hours as needed for nausea Continue Protonix 40 mg p.o. twice daily which she takes chronically. She has a prescription for tramadol and may use that as needed for significant abdominal pain with Tylenol in between. I reviewed her meds and do not see any definite offenders associated with pancreatitis. Further recommendations pending results of labs and CT   Danyon Mcginness Genia Harold PA-C 11/04/2020   Cc: Dorna Mai, MD

## 2020-11-04 NOTE — Progress Notes (Signed)
Addendum: Reviewed and agree with assessment and management plan. Jae Skeet M, MD  
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
## 2020-11-05 ENCOUNTER — Other Ambulatory Visit: Payer: Self-pay

## 2020-11-05 DIAGNOSIS — M545 Low back pain, unspecified: Secondary | ICD-10-CM

## 2020-11-08 ENCOUNTER — Telehealth: Payer: Self-pay | Admitting: Orthopaedic Surgery

## 2020-11-08 NOTE — Telephone Encounter (Signed)
Called pt 1X and left vm to call and set appt for MRI review with Dr. Erlinda Hong after 11/13

## 2020-11-09 ENCOUNTER — Other Ambulatory Visit: Payer: Self-pay

## 2020-11-09 ENCOUNTER — Encounter: Payer: Self-pay | Admitting: Physical Therapy

## 2020-11-09 ENCOUNTER — Ambulatory Visit: Payer: Medicaid Other | Attending: Internal Medicine | Admitting: Physical Therapy

## 2020-11-09 DIAGNOSIS — R2681 Unsteadiness on feet: Secondary | ICD-10-CM | POA: Diagnosis present

## 2020-11-09 DIAGNOSIS — M25651 Stiffness of right hip, not elsewhere classified: Secondary | ICD-10-CM | POA: Diagnosis present

## 2020-11-09 DIAGNOSIS — G8929 Other chronic pain: Secondary | ICD-10-CM | POA: Insufficient documentation

## 2020-11-09 DIAGNOSIS — M25551 Pain in right hip: Secondary | ICD-10-CM | POA: Diagnosis present

## 2020-11-09 DIAGNOSIS — M545 Low back pain, unspecified: Secondary | ICD-10-CM | POA: Diagnosis present

## 2020-11-09 DIAGNOSIS — R262 Difficulty in walking, not elsewhere classified: Secondary | ICD-10-CM | POA: Diagnosis present

## 2020-11-09 DIAGNOSIS — M79651 Pain in right thigh: Secondary | ICD-10-CM | POA: Diagnosis present

## 2020-11-09 DIAGNOSIS — R2689 Other abnormalities of gait and mobility: Secondary | ICD-10-CM | POA: Insufficient documentation

## 2020-11-09 DIAGNOSIS — M79604 Pain in right leg: Secondary | ICD-10-CM | POA: Insufficient documentation

## 2020-11-09 DIAGNOSIS — M6281 Muscle weakness (generalized): Secondary | ICD-10-CM | POA: Diagnosis present
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
## 2020-11-11 ENCOUNTER — Ambulatory Visit: Payer: Medicaid Other | Admitting: Physical Therapy

## 2020-11-11 ENCOUNTER — Other Ambulatory Visit: Payer: Self-pay

## 2020-11-11 ENCOUNTER — Encounter: Payer: Self-pay | Admitting: Physical Therapy

## 2020-11-11 DIAGNOSIS — M79604 Pain in right leg: Secondary | ICD-10-CM

## 2020-11-11 DIAGNOSIS — R262 Difficulty in walking, not elsewhere classified: Secondary | ICD-10-CM
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
## 2020-11-12 ENCOUNTER — Ambulatory Visit (HOSPITAL_COMMUNITY)
Admission: RE | Admit: 2020-11-12 | Discharge: 2020-11-12 | Disposition: A | Discharge status: Home / Self Care | Payer: Medicaid Other | Source: Ambulatory Visit | Attending: Physician Assistant | Admitting: Physician Assistant

## 2020-11-12 DIAGNOSIS — Z8719 Personal history of other diseases of the digestive system: Secondary | ICD-10-CM | POA: Insufficient documentation

## 2020-11-12 DIAGNOSIS — R112 Nausea with vomiting, unspecified: Secondary | ICD-10-CM | POA: Insufficient documentation

## 2020-11-12 DIAGNOSIS — R1084 Generalized abdominal pain: Secondary | ICD-10-CM | POA: Insufficient documentation

## 2020-11-12 MED ORDER — IOHEXOL 350 MG/ML SOLN
80.0000 mL | Freq: Once | INTRAVENOUS | Status: AC | PRN
  Administered 2020-11-12: 80 mL via INTRAVENOUS

## 2020-11-15 ENCOUNTER — Other Ambulatory Visit: Payer: Self-pay

## 2020-11-15 DIAGNOSIS — K760 Fatty (change of) liver, not elsewhere classified: Secondary | ICD-10-CM

## 2020-11-16 ENCOUNTER — Ambulatory Visit: Payer: Medicaid Other

## 2020-11-16 ENCOUNTER — Other Ambulatory Visit: Payer: Self-pay

## 2020-11-16 DIAGNOSIS — R2689 Other abnormalities of gait and mobility: Secondary | ICD-10-CM

## 2020-11-16 DIAGNOSIS — M545 Low back pain, unspecified: Secondary | ICD-10-CM

## 2020-11-16 DIAGNOSIS — M79604 Pain in right leg: Secondary | ICD-10-CM | POA: Diagnosis not present

## 2020-11-16 DIAGNOSIS — M6281 Muscle weakness (generalized): Secondary | ICD-10-CM

## 2020-11-16 DIAGNOSIS — R2681 Unsteadiness on feet: Secondary | ICD-10-CM

## 2020-11-16 DIAGNOSIS — M25651 Stiffness of right hip, not elsewhere classified: Secondary | ICD-10-CM

## 2020-11-16 DIAGNOSIS — M79651 Pain in right thigh: Secondary | ICD-10-CM

## 2020-11-16 DIAGNOSIS — R262 Difficulty in walking, not elsewhere classified: Secondary | ICD-10-CM

## 2020-11-16 DIAGNOSIS — M25551 Pain in right hip: Secondary | ICD-10-CM
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
## 2020-11-17 ENCOUNTER — Other Ambulatory Visit (INDEPENDENT_AMBULATORY_CARE_PROVIDER_SITE_OTHER): Payer: Medicaid Other

## 2020-11-17 DIAGNOSIS — K760 Fatty (change of) liver, not elsewhere classified: Secondary | ICD-10-CM

## 2020-11-17 LAB — PROTIME-INR
INR: 1.1 ratio — ABNORMAL HIGH (ref 0.8–1.0)
Prothrombin Time: 11.7 s (ref 9.6–13.1)

## 2020-11-18 ENCOUNTER — Telehealth: Payer: Self-pay | Admitting: Physician Assistant

## 2020-11-18 ENCOUNTER — Ambulatory Visit: Payer: Medicaid Other

## 2020-11-18 ENCOUNTER — Other Ambulatory Visit: Payer: Self-pay

## 2020-11-18 DIAGNOSIS — M6281 Muscle weakness (generalized): Secondary | ICD-10-CM

## 2020-11-18 DIAGNOSIS — R2681 Unsteadiness on feet: Secondary | ICD-10-CM

## 2020-11-18 DIAGNOSIS — M25551 Pain in right hip: Secondary | ICD-10-CM

## 2020-11-18 DIAGNOSIS — G8929 Other chronic pain: Secondary | ICD-10-CM

## 2020-11-18 DIAGNOSIS — M545 Low back pain, unspecified: Secondary | ICD-10-CM

## 2020-11-18 DIAGNOSIS — M79604 Pain in right leg: Secondary | ICD-10-CM | POA: Diagnosis not present

## 2020-11-18 DIAGNOSIS — R2689 Other abnormalities of gait and mobility: Secondary | ICD-10-CM

## 2020-11-18 DIAGNOSIS — R262 Difficulty in walking, not elsewhere classified: Secondary | ICD-10-CM

## 2020-11-18 NOTE — Telephone Encounter (Signed)
Spoke with the patient. Reviewed the plans of follow up after her CT imaging. She is scheduled to come in to discuss the steatosis.
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
## 2020-11-18 NOTE — Telephone Encounter (Signed)
Inbound call from patient states she was to come to the office 11/9 after she had her labs done to have a discuss a test. Patient is unsure what test she is referring to

## 2020-11-21 ENCOUNTER — Other Ambulatory Visit: Payer: Self-pay

## 2020-11-21 ENCOUNTER — Ambulatory Visit
Admission: RE | Admit: 2020-11-21 | Discharge: 2020-11-21 | Disposition: A | Discharge status: Home / Self Care | Payer: Medicaid Other | Source: Ambulatory Visit | Attending: Orthopaedic Surgery | Admitting: Orthopaedic Surgery

## 2020-11-21 DIAGNOSIS — M545 Low back pain, unspecified: Secondary | ICD-10-CM

## 2020-11-22 LAB — MITOCHONDRIAL ANTIBODIES: Mitochondrial M2 Ab, IgG: <=20 U (ref <=20.0)

## 2020-11-22 LAB — HEPATITIS B SURFACE ANTIGEN: Hepatitis B Surface Ag: NONREACTIVE

## 2020-11-22 LAB — HEPATITIS C ANTIBODY
Hepatitis C Ab: NONREACTIVE
SIGNAL TO CUT-OFF: 0.06 (ref <1.00)

## 2020-11-22 LAB — ANA: Anti Nuclear Antibody (ANA): NEGATIVE

## 2020-11-22 LAB — ANTI-SMOOTH MUSCLE ANTIBODY, IGG: Actin (Smooth Muscle) Antibody (IGG): <20 U (ref <20)

## 2020-11-22 LAB — HEPATITIS B SURFACE ANTIBODY, QUANTITATIVE: Hep B S AB Quant (Post): 231 m[IU]/mL (ref >=10)

## 2020-11-24 ENCOUNTER — Ambulatory Visit (INDEPENDENT_AMBULATORY_CARE_PROVIDER_SITE_OTHER): Payer: Medicaid Other | Admitting: Orthopaedic Surgery

## 2020-11-24 ENCOUNTER — Other Ambulatory Visit: Payer: Self-pay

## 2020-11-24 ENCOUNTER — Encounter: Payer: Self-pay | Admitting: Orthopaedic Surgery

## 2020-11-24 DIAGNOSIS — M545 Low back pain, unspecified: Secondary | ICD-10-CM | POA: Diagnosis not present

## 2020-11-24 DIAGNOSIS — Z6841 Body Mass Index (BMI) 40.0 and over, adult: Secondary | ICD-10-CM

## 2020-11-24 MED ORDER — TRAMADOL HCL 50 MG PO TABS: 50.0000 mg | ORAL_TABLET | Freq: Two times a day (BID) | ORAL | 2 refills | Status: DC | PRN
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
## 2020-11-26 ENCOUNTER — Other Ambulatory Visit: Payer: Self-pay

## 2020-11-26 ENCOUNTER — Ambulatory Visit: Payer: Medicaid Other | Admitting: Physical Therapy

## 2020-11-26 ENCOUNTER — Encounter: Payer: Self-pay | Admitting: Physical Therapy

## 2020-11-26 DIAGNOSIS — R262 Difficulty in walking, not elsewhere classified: Secondary | ICD-10-CM

## 2020-11-26 DIAGNOSIS — R2689 Other abnormalities of gait and mobility: Secondary | ICD-10-CM

## 2020-11-26 DIAGNOSIS — M545 Low back pain, unspecified: Secondary | ICD-10-CM

## 2020-11-26 DIAGNOSIS — R2681 Unsteadiness on feet: Secondary | ICD-10-CM

## 2020-11-26 DIAGNOSIS — M79604 Pain in right leg: Secondary | ICD-10-CM | POA: Diagnosis not present

## 2020-11-26 DIAGNOSIS — M25551 Pain in right hip: Secondary | ICD-10-CM
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
## 2020-11-26 NOTE — Addendum Note (Signed)
Addended by: Gar Ponto on: 11/26/2020 03:16 PM   Modules accepted: Orders

## 2020-11-26 NOTE — Addendum Note (Signed)
Addended by: Gar Ponto on: 11/26/2020 03:03 PM   Modules accepted: Orders

## 2020-11-29 ENCOUNTER — Encounter: Payer: Self-pay | Admitting: *Deleted

## 2020-12-01 ENCOUNTER — Encounter: Payer: Self-pay | Admitting: Physical Therapy

## 2020-12-01 ENCOUNTER — Other Ambulatory Visit: Payer: Self-pay

## 2020-12-01 ENCOUNTER — Ambulatory Visit: Payer: Medicaid Other | Admitting: Physical Therapy

## 2020-12-01 DIAGNOSIS — R2689 Other abnormalities of gait and mobility: Secondary | ICD-10-CM

## 2020-12-01 DIAGNOSIS — R2681 Unsteadiness on feet: Secondary | ICD-10-CM

## 2020-12-01 DIAGNOSIS — M25551 Pain in right hip: Secondary | ICD-10-CM

## 2020-12-01 DIAGNOSIS — R262 Difficulty in walking, not elsewhere classified: Secondary | ICD-10-CM

## 2020-12-01 DIAGNOSIS — M79604 Pain in right leg: Secondary | ICD-10-CM | POA: Diagnosis not present
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
## 2020-12-07 ENCOUNTER — Encounter: Payer: Self-pay | Admitting: Physical Therapy

## 2020-12-07 ENCOUNTER — Other Ambulatory Visit: Payer: Self-pay

## 2020-12-07 ENCOUNTER — Ambulatory Visit (INDEPENDENT_AMBULATORY_CARE_PROVIDER_SITE_OTHER): Payer: Medicaid Other | Admitting: Internal Medicine

## 2020-12-07 ENCOUNTER — Encounter: Payer: Self-pay | Admitting: Internal Medicine

## 2020-12-07 ENCOUNTER — Ambulatory Visit: Payer: Medicaid Other | Admitting: Physical Therapy

## 2020-12-07 VITALS — BP 131/78 | HR 70 | Ht 66.0 in | Wt 246.1 lb

## 2020-12-07 DIAGNOSIS — K5900 Constipation, unspecified: Secondary | ICD-10-CM | POA: Diagnosis not present

## 2020-12-07 DIAGNOSIS — R935 Abnormal findings on diagnostic imaging of other abdominal regions, including retroperitoneum: Secondary | ICD-10-CM | POA: Diagnosis not present

## 2020-12-07 DIAGNOSIS — R112 Nausea with vomiting, unspecified: Secondary | ICD-10-CM | POA: Diagnosis not present

## 2020-12-07 DIAGNOSIS — K219 Gastro-esophageal reflux disease without esophagitis: Secondary | ICD-10-CM

## 2020-12-07 DIAGNOSIS — K760 Fatty (change of) liver, not elsewhere classified: Secondary | ICD-10-CM | POA: Diagnosis not present

## 2020-12-07 DIAGNOSIS — R262 Difficulty in walking, not elsewhere classified: Secondary | ICD-10-CM

## 2020-12-07 DIAGNOSIS — R2681 Unsteadiness on feet: Secondary | ICD-10-CM

## 2020-12-07 DIAGNOSIS — R2689 Other abnormalities of gait and mobility: Secondary | ICD-10-CM

## 2020-12-07 DIAGNOSIS — M79604 Pain in right leg: Secondary | ICD-10-CM | POA: Diagnosis not present

## 2020-12-07 DIAGNOSIS — M25551 Pain in right hip: Secondary | ICD-10-CM

## 2020-12-07 MED ORDER — ONDANSETRON 4 MG PO TBDP: ORAL_TABLET | ORAL | 1 refills | Status: DC

## 2020-12-07 NOTE — Patient Instructions (Signed)
Continue Pantoprazole as directed.   Continue Miralax as directed.   We have sent the following medications to your pharmacy for you to pick up at your convenience: Zofran    You have been scheduled for an abdominal ultrasound at [facility] (1st floor of hospital) on 12/24/20 at 8:00am. Please arrive 15 minutes prior to your appointment for registration. Make certain not to have anything to eat or drink 6 hours prior to your appointment. Should you need to reschedule your appointment, please contact radiology at [phone]. This test typically takes about 30 minutes to perform.  You have been scheduled for a gastric emptying scan at [facility] on 12/24/20 at 9:00am. Please arrive at least 15 minutes prior to your appointment for registration. Please make certain not to have anything to eat or drink after midnight the night before your test. Hold all stomach medications (ex: Zofran, phenergan, Reglan) 48 hours prior to your test. If you need to reschedule your appointment, please contact radiology scheduling at [phone]. _____________________________________________________________________ A gastric-emptying study measures how long it takes for food to move through your stomach. There are several ways to measure stomach emptying. In the most common test, you eat food that contains a small amount of radioactive material. A scanner that detects the movement of the radioactive material is placed over your abdomen to monitor the rate at which food leaves your stomach. This test normally takes about 4 hours to complete. _____________________________________________________________________   If you are age 67 or younger, your body mass index should be between 19-25. Your Body mass index is 39.73 kg/m. If this is out of the aformentioned range listed, please consider follow up with your Primary Care Provider.   ________________________________________________________  The  [facility] providers would like to encourage you to use [facility] to communicate with providers for non-urgent requests or questions.  Due to long hold times on the telephone, sending your provider a message by [facility] may be a faster and more efficient way to get a response.  Please allow 48 business hours for a response.  Please remember that this is for non-urgent requests.   Thank you for choosing me and [facility].  Dr.Pyrtle
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
## 2020-12-07 NOTE — Progress Notes (Signed)
Subjective:    Patient ID: Tara Keller, female    DOB: [birth year], 55 y.o.   MRN: [id number]  HPI Aldonia Hua is a 55 year old female with a history of GERD with Barrett's esophagus without dysplasia, history of pancreatitis, fatty liver, history of colon polyps who is here for follow-up.  She also has a history of hypertension, sleep apnea, diabetes with neuropathy, obesity, osteoarthritis and prior endometrial cancer.  She is here alone today and was last seen on 11/04/2020 by Nicoletta Ba, PA-C.  She had upper endoscopy in July 2021, see note for details.  She had colonoscopy in August 2020, see note for details.  At the time of her last office visit she was having a 2-week history of epigastric and hypogastric pain worse after eating.  She was also having intermittent nausea and vomiting.  There was question of recurrent pancreatitis.  CT scan was repeated and is available in the objective section of this note. CT scan abdomen pelvis with contrast on 11/15/2020 showed no acute abnormality.  Pancreas was normal without dilatation or mass.  There was moderate colonic stool volume suggesting constipation.  There was suggestion of diffuse hepatic steatosis with a finely irregular liver surface raising the question of cirrhosis.  No liver masses.  There was stable mild porta hepatis adenopathy felt to be reactive.  After her last visit Amy recommended continuing pantoprazole twice daily and beginning MiraLAX after the CT scan suggested constipation.  The patient has done that.  She reports that her abdominal pain has resolved.  She does continue to deal with early fullness and appetite that comes and goes.  She is having vomiting of food with only mild nausea multiple days per week.  She is not having abdominal pain with this as above.  She is using MiraLAX on a daily basis and bowel movements are now easy and daily.  She does use Zofran which Amy prescribed and this helps some of the time.  Other  times she does not seem to have enough warning before she vomits.  She is also dealing with right lower back pain radiating towards the right hip.  She let me know that her mother died after having a COVID-67 illness in August of this year.  She has been helping take care of her sister who is bedbound.  She has been helping her daily.  35 lb weight loss from May 2022 to today.   Review of Systems As per HPI, otherwise negative  Current Medications, Allergies, Past Medical History, Past Surgical History, Family History and Social History were reviewed in [employer] record.    Objective:   Physical Exam BP 131/78   Pulse 70   Ht 5\' 6"  (1.676 m)   Wt 246 lb 2 oz (111.6 kg)   LMP 01/23/2014 (Approximate)   BMI 39.73 kg/m  Gen: awake, alert, NAD HEENT: anicteric CV: RRR, no mrg Pulm: CTA b/l Abd: soft, obese, NT/ND, +BS throughout Ext: no c/c/e Neuro: nonfocal  Lipase     Component Value Date/Time   LIPASE 126.0 (H) 11/04/2020 1414   CBC    Component Value Date/Time   WBC 11.4 (H) 11/04/2020 1414   RBC 4.90 11/04/2020 1414   HGB 13.6 11/04/2020 1414   HGB 12.0 07/27/2020 1027   HCT 41.1 11/04/2020 1414   HCT 37.9 07/27/2020 1027   PLT 342.0 11/04/2020 1414   PLT 287 07/27/2020 1027   MCV 83.9 11/04/2020 1414   MCV 88 07/27/2020  1027   MCH 28.3 09/24/2020 0753   MCHC 33.0 11/04/2020 1414   RDW 13.3 11/04/2020 1414   RDW 13.1 07/27/2020 1027   LYMPHSABS 4.9 (H) 11/04/2020 1414   LYMPHSABS 5.2 (H) 12/11/2019 1700   MONOABS 0.9 11/04/2020 1414   EOSABS 0.3 11/04/2020 1414   EOSABS 0.3 12/11/2019 1700   BASOSABS 0.1 11/04/2020 1414   BASOSABS 0.0 12/11/2019 1700   CMP     Component Value Date/Time   NA 136 11/04/2020 1414   NA 142 10/22/2019 1551   K 3.9 11/04/2020 1414   CL 100 11/04/2020 1414   CO2 28 11/04/2020 1414   GLUCOSE 126 (H) 11/04/2020 1414   BUN 5 (L) 11/04/2020 1414   BUN 6 10/22/2019 1551   CREATININE 0.81 11/04/2020  1414   CREATININE 0.61 01/17/2016 1407   CALCIUM 9.9 11/04/2020 1414   PROT 8.2 11/04/2020 1414   PROT 7.7 10/22/2019 1551   ALBUMIN 4.2 11/04/2020 1414   ALBUMIN 4.2 10/22/2019 1551   AST 10 11/04/2020 1414   ALT 12 11/04/2020 1414   ALKPHOS 187 (H) 11/04/2020 1414   BILITOT 0.3 11/04/2020 1414   BILITOT <0.2 10/22/2019 1551   GFRNONAA >60 03/23/2020 1024   GFRNONAA >89 01/17/2016 1407   GFRAA 114 10/22/2019 1551   GFRAA >89 01/17/2016 1407   CT ABDOMEN AND PELVIS WITH CONTRAST   TECHNIQUE: Multidetector CT imaging of the abdomen and pelvis was performed using the standard protocol following bolus administration of intravenous contrast.   CONTRAST:  53mL OMNIPAQUE IOHEXOL 350 MG/ML SOLN   COMPARISON:  CT abdomen and pelvis 04/29/2019; U/S pelvis 07/18/2018; X-ray chest 05/28/2017.   FINDINGS: Lower chest: Ground-glass 0.8 cm right lower lobe pulmonary nodule (series 4/image 4), stable. No acute abnormality at the lung bases.   Hepatobiliary: Suggestion of diffuse hepatic steatosis. Liver surface is diffusely finely irregular, suggesting cirrhosis. No liver masses. Cholecystectomy. No biliary ductal dilatation.   Pancreas: Normal, with no mass or duct dilation.   Spleen: Normal size. No mass.   Adrenals/Urinary Tract: Normal adrenals. Simple 1.4 cm interpolar right renal cyst. Otherwise normal kidneys, with no hydronephrosis. Normal nondistended bladder.   Stomach/Bowel: Small hiatal hernia. Otherwise normal nondistended stomach. Normal caliber small bowel with no small bowel wall thickening. Normal appendix. Oral contrast transits to the colon. Moderate diffuse colonic stool with no large bowel wall thickening, significant diverticulosis or acute pericolonic fat stranding.   Vascular/Lymphatic: Mildly atherosclerotic nonaneurysmal abdominal aorta. Patent portal, splenic, hepatic and renal veins. Mildly enlarged 1.2 cm porta hepatis node (series 2/image 29),  stable. No additional pathologically enlarged lymph nodes in the abdomen or pelvis.   Reproductive: Anteverted uterus. Intrauterine device is grossly well-positioned in the uterine cavity. Exophytic posterior left fundal 2.8 cm fibroid. No adnexal mass.   Other: No pneumoperitoneum, ascites or focal fluid collection.   Musculoskeletal: No aggressive appearing focal osseous lesions. Mild thoracolumbar spondylosis.   IMPRESSION: 1. No acute abnormality. No evidence of bowel obstruction or acute bowel inflammation. Normal appendix. 2. Moderate diffuse colonic stool volume, suggesting constipation. 3. Suggestion of diffuse hepatic steatosis. Liver surface is diffusely finely irregular, suggesting cirrhosis. No liver masses. Suggest correlation with liver function tests. Consider hepatic elastography for further liver fibrosis risk stratification, as clinically warranted. 4. Stable mild porta hepatis adenopathy, nonspecific, probably reactive. 5. Small hiatal hernia. 6. Aortic Atherosclerosis (ICD10-I70.0).     Electronically Signed   By: Ilona Sorrel M.D.   On: 11/15/2020 13:25  Assessment & Plan:  55 year old female with a history of GERD with Barrett's esophagus without dysplasia, history of pancreatitis, fatty liver, history of colon polyps who is here for follow-up.    Nausea and vomiting --her upper abdominal pain resolved and may have been somewhat secondary to constipation.  CT did not show pancreatitis.  I remain suspicious for gastroparesis (though normal GES in 2018).  Zofran has been helpful --Gastric emptying scan --Zofran 4 mg every 6 hours as needed nausea and vomiting --Continue PPI as below  2.  GERD with Barrett's esophagus --no dysplasia plan last surveyed last year.  Continue pantoprazole 40 mg twice daily  3.  Fatty liver --subtle nodularity to the liver by recent CT scan.  We discussed fatty liver at length today.  She does not have overt clinical  signs of cirrhosis.  Fortunately her platelets are normal.  Her INR is near normal at 1.1.  No obvious portal hypertensive changes by CT scan.  We discussed the importance of risk factor modification including tight control of her blood sugar and blood pressures.  Her A1c has been coming down which is good.  Her weight loss will help the fatty liver though the weight loss remains somewhat unexplained (could relate to #1) -- Ultrasound with elastography  4.  Mild constipation/abdominal pain/hx pancreatitis  --her upper abdominal pain resolved after her last visit.  Her lipase was elevated slightly but CT did not show pancreatitis.  She is status postcholecystectomy.  She does not drink alcohol.   -- Continue MiraLAX 17 g daily --If recurrent upper abdominal pain we could consider MRCP to exclude stone/microlithiasis  3 to 32-month follow-up, sooner if needed  30 minutes total spent today including patient facing time, coordination of care, reviewing medical history/procedures/pertinent radiology studies, and documentation of the encounter.

## 2020-12-09 ENCOUNTER — Other Ambulatory Visit: Payer: Self-pay

## 2020-12-09 ENCOUNTER — Encounter: Payer: Self-pay | Admitting: Physical Therapy

## 2020-12-09 ENCOUNTER — Ambulatory Visit: Payer: Medicaid Other | Attending: Orthopaedic Surgery | Admitting: Physical Therapy

## 2020-12-09 DIAGNOSIS — M25551 Pain in right hip: Secondary | ICD-10-CM | POA: Insufficient documentation

## 2020-12-09 DIAGNOSIS — G8929 Other chronic pain: Secondary | ICD-10-CM | POA: Diagnosis present

## 2020-12-09 DIAGNOSIS — M79604 Pain in right leg: Secondary | ICD-10-CM | POA: Insufficient documentation

## 2020-12-09 DIAGNOSIS — R2689 Other abnormalities of gait and mobility: Secondary | ICD-10-CM | POA: Insufficient documentation

## 2020-12-09 DIAGNOSIS — M545 Low back pain, unspecified: Secondary | ICD-10-CM | POA: Diagnosis present

## 2020-12-09 DIAGNOSIS — R262 Difficulty in walking, not elsewhere classified: Secondary | ICD-10-CM | POA: Diagnosis not present

## 2020-12-09 DIAGNOSIS — M6281 Muscle weakness (generalized): Secondary | ICD-10-CM | POA: Diagnosis present

## 2020-12-09 DIAGNOSIS — R2681 Unsteadiness on feet: Secondary | ICD-10-CM | POA: Diagnosis present

## 2020-12-09 DIAGNOSIS — K911 Postgastric surgery syndromes: Secondary | ICD-10-CM

## 2020-12-09 HISTORY — DX: Postgastric surgery syndromes: K91.1
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
## 2020-12-13 ENCOUNTER — Other Ambulatory Visit (HOSPITAL_COMMUNITY): Payer: Self-pay

## 2020-12-13 ENCOUNTER — Telehealth: Payer: Self-pay | Admitting: Pharmacy Technician

## 2020-12-13 ENCOUNTER — Telehealth: Payer: Self-pay | Admitting: Internal Medicine

## 2020-12-13 MED ORDER — DEXCOM G6 SENSOR MISC: 1.0000 | 3 refills | Status: DC

## 2020-12-13 NOTE — Telephone Encounter (Signed)
Script sent  

## 2020-12-13 NOTE — Telephone Encounter (Signed)
Patient Advocate Encounter   Received notification from [facility] that prior authorization for [employer] is required by his/her insurance [employer] [employer].   PA submitted on 12/13/20 Confirmation # [id number] W Status is pending    [facility] will continue to follow:  Patient Advocate Fax:  [phone]

## 2020-12-13 NOTE — Telephone Encounter (Signed)
PT called stating we should have received fax from [place] on Continuous Blood Gluc Sensor ([place]) [place] [[phone]].   Fax concerning approval for refill on the above. [facility] [phone] - [place], [address] AT [place] Phone:  [phone]  Fax:  [phone]

## 2020-12-16 ENCOUNTER — Other Ambulatory Visit: Payer: Self-pay

## 2020-12-16 ENCOUNTER — Ambulatory Visit: Payer: Medicaid Other

## 2020-12-16 DIAGNOSIS — R262 Difficulty in walking, not elsewhere classified: Secondary | ICD-10-CM

## 2020-12-16 DIAGNOSIS — M6281 Muscle weakness (generalized): Secondary | ICD-10-CM

## 2020-12-16 DIAGNOSIS — R2689 Other abnormalities of gait and mobility: Secondary | ICD-10-CM

## 2020-12-16 DIAGNOSIS — G8929 Other chronic pain: Secondary | ICD-10-CM

## 2020-12-16 DIAGNOSIS — M545 Low back pain, unspecified: Secondary | ICD-10-CM

## 2020-12-16 DIAGNOSIS — M25551 Pain in right hip: Secondary | ICD-10-CM

## 2020-12-16 DIAGNOSIS — M79604 Pain in right leg: Secondary | ICD-10-CM

## 2020-12-16 DIAGNOSIS — R2681 Unsteadiness on feet: Secondary | ICD-10-CM
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
## 2020-12-22 ENCOUNTER — Ambulatory Visit: Payer: Medicaid Other | Admitting: Internal Medicine

## 2020-12-24 ENCOUNTER — Ambulatory Visit (HOSPITAL_COMMUNITY)
Admission: RE | Admit: 2020-12-24 | Discharge: 2020-12-24 | Disposition: A | Discharge status: Home / Self Care | Payer: Medicaid Other | Source: Ambulatory Visit | Attending: Internal Medicine | Admitting: Internal Medicine

## 2020-12-24 DIAGNOSIS — R112 Nausea with vomiting, unspecified: Secondary | ICD-10-CM | POA: Insufficient documentation

## 2020-12-24 DIAGNOSIS — K5900 Constipation, unspecified: Secondary | ICD-10-CM | POA: Diagnosis present

## 2020-12-24 DIAGNOSIS — K219 Gastro-esophageal reflux disease without esophagitis: Secondary | ICD-10-CM

## 2020-12-24 DIAGNOSIS — R935 Abnormal findings on diagnostic imaging of other abdominal regions, including retroperitoneum: Secondary | ICD-10-CM

## 2020-12-24 DIAGNOSIS — K760 Fatty (change of) liver, not elsewhere classified: Secondary | ICD-10-CM | POA: Diagnosis not present

## 2020-12-28 ENCOUNTER — Encounter: Payer: Self-pay | Admitting: Podiatry

## 2020-12-28 ENCOUNTER — Ambulatory Visit: Payer: Medicaid Other | Admitting: Podiatry

## 2020-12-28 ENCOUNTER — Other Ambulatory Visit: Payer: Self-pay

## 2020-12-28 DIAGNOSIS — M79674 Pain in right toe(s): Secondary | ICD-10-CM | POA: Diagnosis not present

## 2020-12-28 DIAGNOSIS — E1149 Type 2 diabetes mellitus with other diabetic neurological complication: Secondary | ICD-10-CM

## 2020-12-28 DIAGNOSIS — B351 Tinea unguium: Secondary | ICD-10-CM

## 2020-12-28 DIAGNOSIS — M79675 Pain in left toe(s): Secondary | ICD-10-CM

## 2020-12-28 NOTE — Progress Notes (Signed)
Complaint:  Visit Type: Patient returns to my office for continued preventative foot care services. Complaint: Patient states" my nails have grown long and thick and become painful to walk and wear shoes" Patient has been diagnosed with DM with neuropathy. The patient presents for preventative foot care services.  Podiatric Exam: Vascular: dorsalis pedis and posterior tibial pulses are palpable bilateral. Capillary return is immediate. Temperature gradient is WNL. Skin turgor WNL  Sensorium: Diminished  Semmes Weinstein monofilament test. Normal tactile sensation bilaterally. Nail Exam: Pt has thick disfigured discolored nails with subungual debris noted bilateral entire nail hallux through fifth toenails Ulcer Exam: There is no evidence of ulcer or pre-ulcerative changes or infection. Orthopedic Exam: Muscle tone and strength are WNL. No limitations in general ROM. No crepitus or effusions noted. Foot type and digits show no abnormalities. HAV  B/L. Skin: No Porokeratosis. No infection or ulcers  Diagnosis:  Onychomycosis, , Pain in right toe, pain in left toes  Treatment & Plan Procedures and Treatment: Consent by patient was obtained for treatment procedures.   Debridement of mycotic and hypertrophic toenails, 1 through 5 bilateral and clearing of subungual debris. No ulceration, no infection noted.  Return Visit-Office Procedure: Patient instructed to return to the office for a follow up visit 3 months for continued evaluation and treatment.    Giovana Faciane DPM 

## 2021-01-04 ENCOUNTER — Encounter: Payer: Self-pay | Admitting: *Deleted

## 2021-01-14 ENCOUNTER — Other Ambulatory Visit: Payer: Self-pay | Admitting: Family Medicine

## 2021-01-24 ENCOUNTER — Ambulatory Visit: Payer: Medicaid Other | Admitting: Family Medicine

## 2021-01-25 ENCOUNTER — Other Ambulatory Visit: Payer: Self-pay

## 2021-01-25 ENCOUNTER — Ambulatory Visit (INDEPENDENT_AMBULATORY_CARE_PROVIDER_SITE_OTHER): Payer: Medicaid Other | Admitting: Family Medicine

## 2021-01-25 ENCOUNTER — Encounter: Payer: Self-pay | Admitting: Family Medicine

## 2021-01-25 VITALS — BP 105/70 | HR 89 | Temp 98.1°F | Resp 16 | Wt 256.0 lb

## 2021-01-25 DIAGNOSIS — M79604 Pain in right leg: Secondary | ICD-10-CM

## 2021-01-25 DIAGNOSIS — M79605 Pain in left leg: Secondary | ICD-10-CM

## 2021-01-25 DIAGNOSIS — E1169 Type 2 diabetes mellitus with other specified complication: Secondary | ICD-10-CM | POA: Diagnosis not present

## 2021-01-25 DIAGNOSIS — Z794 Long term (current) use of insulin: Secondary | ICD-10-CM

## 2021-01-25 LAB — POCT GLYCOSYLATED HEMOGLOBIN (HGB A1C): Hemoglobin A1C: 7.5 % — AB (ref 4.0–5.6)

## 2021-01-25 MED ORDER — TRAMADOL HCL 50 MG PO TABS: 50.0000 mg | ORAL_TABLET | Freq: Two times a day (BID) | ORAL | 0 refills | Status: DC | PRN

## 2021-01-25 NOTE — Progress Notes (Signed)
Patient request something for pain for her legs. Patient c/o being sore sometimes

## 2021-01-26 ENCOUNTER — Ambulatory Visit (INDEPENDENT_AMBULATORY_CARE_PROVIDER_SITE_OTHER): Payer: Medicaid Other | Admitting: Internal Medicine

## 2021-01-26 ENCOUNTER — Encounter: Payer: Self-pay | Admitting: Internal Medicine

## 2021-01-26 ENCOUNTER — Encounter: Payer: Self-pay | Admitting: Family Medicine

## 2021-01-26 VITALS — BP 140/90 | HR 94 | Ht 66.0 in | Wt 254.0 lb

## 2021-01-26 DIAGNOSIS — E1142 Type 2 diabetes mellitus with diabetic polyneuropathy: Secondary | ICD-10-CM

## 2021-01-26 DIAGNOSIS — Z794 Long term (current) use of insulin: Secondary | ICD-10-CM | POA: Diagnosis not present

## 2021-01-26 DIAGNOSIS — E876 Hypokalemia: Secondary | ICD-10-CM

## 2021-01-26 DIAGNOSIS — E785 Hyperlipidemia, unspecified: Secondary | ICD-10-CM | POA: Diagnosis not present

## 2021-01-26 DIAGNOSIS — E1169 Type 2 diabetes mellitus with other specified complication: Secondary | ICD-10-CM | POA: Diagnosis not present

## 2021-01-26 LAB — BASIC METABOLIC PANEL
BUN: 7 mg/dL (ref 6–23)
CO2: 27 mEq/L (ref 19–32)
Calcium: 9.6 mg/dL (ref 8.4–10.5)
Chloride: 99 mEq/L (ref 96–112)
Creatinine, Ser: 0.78 mg/dL (ref 0.40–1.20)
GFR: 84.27 mL/min (ref >60.00)
Glucose, Bld: 108 mg/dL — ABNORMAL HIGH (ref 70–99)
Potassium: 4 mEq/L (ref 3.5–5.1)
Sodium: 133 mEq/L — ABNORMAL LOW (ref 135–145)

## 2021-01-26 LAB — LIPID PANEL
Cholesterol: 172 mg/dL (ref 0–200)
HDL: 38 mg/dL — ABNORMAL LOW (ref >39.00)
LDL Cholesterol: 101 mg/dL — ABNORMAL HIGH (ref 0–99)
NonHDL: 133.88
Total CHOL/HDL Ratio: 5
Triglycerides: 165 mg/dL — ABNORMAL HIGH (ref 0.0–149.0)
VLDL: 33 mg/dL (ref 0.0–40.0)
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
## 2021-01-26 NOTE — Patient Instructions (Addendum)
°-   Keep Up the Good Work ! -  Continue Metformin 750 mg to 1 tablet TWICE a day  - Continue  Lantus to 54  units ONCE a day  - Continue  Novolog 14 units with each meal  - Continue  farxiga 10 mg , 1 tablet daily    - Novolog correctional insulin: ADD extra units on insulin to your meal-time Novolog dose if your blood sugars are higher than 160. Use the scale below to help guide you:   Blood sugar before meal Number of units to inject  Less than 160 0 unit  161 - 190 1 units  191 - 220 2 units  221 - 250 3 units  251 - 280 4 units  281 - 310 5 units  311 - 340 6 units  341 - 370 7 units  371 - 400 8 units      HOW TO TREAT LOW BLOOD SUGARS (Blood sugar LESS THAN 70 MG/DL) Please follow the RULE OF 15 for the treatment of hypoglycemia treatment (when your (blood sugars are less than 70 mg/dL)   STEP 1: Take 15 grams of carbohydrates when your blood sugar is low, which includes:  3-4 GLUCOSE TABS  OR 3-4 OZ OF JUICE OR REGULAR SODA OR ONE TUBE OF GLUCOSE GEL    STEP 2: RECHECK blood sugar in 15 MINUTES STEP 3: If your blood sugar is still low at the 15 minute recheck --> then, go back to STEP 1 and treat AGAIN with another 15 grams of carbohydrates.

## 2021-01-27 DIAGNOSIS — E876 Hypokalemia: Secondary | ICD-10-CM | POA: Insufficient documentation

## 2021-01-27 DIAGNOSIS — E119 Type 2 diabetes mellitus without complications: Secondary | ICD-10-CM | POA: Insufficient documentation

## 2021-01-27 MED ORDER — ATORVASTATIN CALCIUM 20 MG PO TABS: 20.0000 mg | ORAL_TABLET | Freq: Every day | ORAL | 3 refills | Status: DC

## 2021-02-01 LAB — ALDOSTERONE + RENIN ACTIVITY W/ RATIO
ALDOS/RENIN RATIO: 0.4 (ref 0.0–30.0)
ALDOSTERONE: 5 ng/dL (ref 0.0–30.0)
Renin: 12.593 ng/mL/hr — ABNORMAL HIGH (ref 0.167–5.380)

## 2021-02-02 ENCOUNTER — Encounter: Payer: Self-pay | Admitting: Internal Medicine

## 2021-02-02 ENCOUNTER — Ambulatory Visit: Payer: Medicaid Other | Admitting: Internal Medicine

## 2021-02-02 VITALS — BP 120/78 | HR 73 | Ht 66.0 in | Wt 251.0 lb

## 2021-02-02 DIAGNOSIS — K5900 Constipation, unspecified: Secondary | ICD-10-CM | POA: Diagnosis not present

## 2021-02-02 DIAGNOSIS — K760 Fatty (change of) liver, not elsewhere classified: Secondary | ICD-10-CM

## 2021-02-02 DIAGNOSIS — K219 Gastro-esophageal reflux disease without esophagitis: Secondary | ICD-10-CM

## 2021-02-02 DIAGNOSIS — K2270 Barrett's esophagus without dysplasia: Secondary | ICD-10-CM | POA: Diagnosis not present

## 2021-02-02 MED ORDER — PANTOPRAZOLE SODIUM 40 MG PO TBEC: 40.0000 mg | DELAYED_RELEASE_TABLET | Freq: Two times a day (BID) | ORAL | 11 refills | Status: DC

## 2021-02-02 NOTE — Progress Notes (Signed)
Subjective:    Patient ID: Tara Keller, female    DOB: 1965-11-11, 56 y.o.   MRN: [id number]  HPI Tara Keller is a 56 year old female with a history of GERD with Barrett's esophagus without dysplasia, history of pancreatitis, fatty liver, history of colon polyps who is here for follow-up.  She also has a history of hypertension, sleep apnea, diabetes with neuropathy, obesity, osteoarthritis and prior endometrial cancer.  She is here alone today and I last saw her on 12/07/2020.  At the time of her last visit she was having issues with ongoing nausea vomiting no her upper abdominal pain had resolved.  Gastric emptying scan was done and can be seen below.  We also recommended ultrasound with elastography to evaluate fatty liver and subtle nodularity seen by CT.  Elastography results below.  She reports on the whole personally she is doing better.  She is changed her eating habits.  She is eating baked foods rather than fried.  Focusing on salads and green foods.  She is avoiding fatty foods.  Bowel movements have been more normal.  She is using MiraLAX.  She has had no further nausea vomiting or abdominal pain.  Her blood sugars have been better controlled.  Her weight loss is stabilized.  She does continue to have increased stress in her life related to the health of her younger sister.  Her sister is planning to leave the hospital after over 2-week hospitalization with hypoxia and respiratory issues.  She helps provide care for her sister who is morbidly obese and other family members including her sisters children are not involved directly with her care.  This is been isolating for Tara Keller.   Review of Systems As per HPI, otherwise negative  Current Medications, Allergies, Past Medical History, Past Surgical History, Family History and Social History were reviewed in [employer] record.     Objective:   Physical Exam BP 120/78    Pulse 73    Ht 5\' 6"  (1.676 m)    Wt  251 lb (113.9 kg)    LMP 01/23/2014 (Approximate)    BMI 40.51 kg/m  Gen: awake, alert, NAD HEENT: anicteric Neuro: nonfocal  CMP     Component Value Date/Time   NA 133 (L) 01/26/2021 1438   NA 142 10/22/2019 1551   K 4.0 01/26/2021 1438   CL 99 01/26/2021 1438   CO2 27 01/26/2021 1438   GLUCOSE 108 (H) 01/26/2021 1438   BUN 7 01/26/2021 1438   BUN 6 10/22/2019 1551   CREATININE 0.78 01/26/2021 1438   CREATININE 0.61 01/17/2016 1407   CALCIUM 9.6 01/26/2021 1438   PROT 8.2 11/04/2020 1414   PROT 7.7 10/22/2019 1551   ALBUMIN 4.2 11/04/2020 1414   ALBUMIN 4.2 10/22/2019 1551   AST 10 11/04/2020 1414   ALT 12 11/04/2020 1414   ALKPHOS 187 (H) 11/04/2020 1414   BILITOT 0.3 11/04/2020 1414   BILITOT <0.2 10/22/2019 1551   GFRNONAA >60 03/23/2020 1024   GFRNONAA >89 01/17/2016 1407   GFRAA 114 10/22/2019 1551   GFRAA >89 01/17/2016 1407   Lab Results  Component Value Date   INR 1.1 (H) 11/17/2020   INR 1.0 07/21/2013   CBC Latest Ref Rng & Units 11/04/2020 09/24/2020 07/27/2020  WBC 4.0 - 10.5 K/uL 11.4(H) 9.3 9.4  Hemoglobin 12.0 - 15.0 g/dL 13.6 12.1 12.0  Hematocrit 36.0 - 46.0 % 41.1 38.0 37.9  Platelets 150.0 - 400.0 K/uL 342.0 245 287  NUCLEAR MEDICINE GASTRIC EMPTYING SCAN   TECHNIQUE: After oral ingestion of radiolabeled meal, sequential abdominal images were obtained for 120 minutes. Residual percentage of activity remaining within the stomach was calculated at 60 and 120 minutes.   RADIOPHARMACEUTICALS:  2 mCi Tc-21m sulfur colloid in standardized meal   COMPARISON:  08/25/2016   FINDINGS: Expected location of the stomach in the left upper quadrant. Ingested meal empties the stomach gradually over the course of the study with 3.3% retention at 60 min (normal retention less than 30% at 120 min).   IMPRESSION: Brisk gastric emptying suggestive of dumping     Electronically Signed   By: Inez Catalina M.D.   On: 12/24/2020 23:11      ULTRASOUND ABDOMEN   ULTRASOUND HEPATIC ELASTOGRAPHY   TECHNIQUE: Sonography of the upper abdomen was performed. In addition, ultrasound elastography evaluation of the liver was performed. A region of interest was placed within the right lobe of the liver. Following application of a compressive sonographic pulse, tissue compressibility was assessed. Multiple assessments were performed at the selected site. Median tissue compressibility was determined. Previously, hepatic stiffness was assessed by shear wave velocity. Based on recently published Society of Radiologists in Ultrasound consensus article, reporting is now recommended to be performed in the SI units of pressure (kiloPascals) representing hepatic stiffness/elasticity. The obtained result is compared to the published reference standards. (cACLD = compensated Advanced Chronic Liver Disease)   COMPARISON:  CT 11/12/2020   FINDINGS: ULTRASOUND ABDOMEN   Gallbladder: Prior cholecystectomy   Common bile duct: Diameter: Normal caliber, 3 mm   Liver: Increased echotexture compatible with fatty infiltration. No focal abnormality or biliary ductal dilatation. Portal vein is patent on color Doppler imaging with normal direction of blood flow towards the liver.   IVC: No abnormality visualized.   Pancreas: Visualized portion unremarkable.   Spleen: Size and appearance within normal limits.   Right Kidney: Length: 12.6 cm. Echogenicity within normal limits. No mass or hydronephrosis visualized.   Left Kidney: Length: 12.3 cm. Echogenicity within normal limits. No mass or hydronephrosis visualized.   Abdominal aorta: No aneurysm visualized.   Other findings: None.   ULTRASOUND HEPATIC ELASTOGRAPHY   Device: Siemens Helix VTQ   Patient position: Oblique   Transducer 5C1   Number of measurements: 10   Hepatic segment:  8   Median kPa: 3.4   IQR: 0.9   IQR/Median kPa ratio: 0.3   Data quality:  Good    Diagnostic category:  < or = 5 kPa: high probability of being normal   The use of hepatic elastography is applicable to patients with viral hepatitis and non-alcoholic fatty liver disease. At this time, there is insufficient data for the referenced cut-off values and use in other causes of liver disease, including alcoholic liver disease. Patients, however, may be assessed by elastography and serve as their own reference standard/baseline.   In patients with non-alcoholic liver disease, the values suggesting compensated advanced chronic liver disease (cACLD) may be lower, and patients may need additional testing with elasticity results of 7-9 kPa.   Please note that abnormal hepatic elasticity and shear wave velocities may also be identified in clinical settings other than with hepatic fibrosis, such as: acute hepatitis, elevated right heart and central venous pressures including use of beta blockers, veno-occlusive disease (Budd-Chiari), infiltrative processes such as mastocytosis/amyloidosis/infiltrative tumor/lymphoma, extrahepatic cholestasis, with hyperemia in the post-prandial state, and with liver transplantation. Correlation with patient history, laboratory data, and clinical condition recommended.   Diagnostic Categories:   <  or =5 kPa: high probability of being normal   < or =9 kPa: in the absence of other known clinical signs, rules out cACLD   >9 kPa and ?13 kPa: suggestive of cACLD, but needs further testing   >13 kPa: highly suggestive of cACLD   > or =17 kPa: highly suggestive of cACLD with an increased probability of clinically significant portal hypertension   IMPRESSION: ULTRASOUND ABDOMEN:   Hepatic steatosis.   Prior cholecystectomy.   ULTRASOUND HEPATIC ELASTOGRAPHY:   Median kPa:  3.4   Diagnostic category:  < or = 5 kPa: high probability of being normal     Electronically Signed   By: Rolm Baptise M.D.   On: 12/24/2020 11:49   CT  ABDOMEN AND PELVIS WITH CONTRAST   TECHNIQUE: Multidetector CT imaging of the abdomen and pelvis was performed using the standard protocol following bolus administration of intravenous contrast.   CONTRAST:  43mL OMNIPAQUE IOHEXOL 350 MG/ML SOLN   COMPARISON:  CT abdomen and pelvis 04/29/2019; U/S pelvis 07/18/2018; X-ray chest 05/28/2017.   FINDINGS: Lower chest: Ground-glass 0.8 cm right lower lobe pulmonary nodule (series 4/image 4), stable. No acute abnormality at the lung bases.   Hepatobiliary: Suggestion of diffuse hepatic steatosis. Liver surface is diffusely finely irregular, suggesting cirrhosis. No liver masses. Cholecystectomy. No biliary ductal dilatation.   Pancreas: Normal, with no mass or duct dilation.   Spleen: Normal size. No mass.   Adrenals/Urinary Tract: Normal adrenals. Simple 1.4 cm interpolar right renal cyst. Otherwise normal kidneys, with no hydronephrosis. Normal nondistended bladder.   Stomach/Bowel: Small hiatal hernia. Otherwise normal nondistended stomach. Normal caliber small bowel with no small bowel wall thickening. Normal appendix. Oral contrast transits to the colon. Moderate diffuse colonic stool with no large bowel wall thickening, significant diverticulosis or acute pericolonic fat stranding.   Vascular/Lymphatic: Mildly atherosclerotic nonaneurysmal abdominal aorta. Patent portal, splenic, hepatic and renal veins. Mildly enlarged 1.2 cm porta hepatis node (series 2/image 29), stable. No additional pathologically enlarged lymph nodes in the abdomen or pelvis.   Reproductive: Anteverted uterus. Intrauterine device is grossly well-positioned in the uterine cavity. Exophytic posterior left fundal 2.8 cm fibroid. No adnexal mass.   Other: No pneumoperitoneum, ascites or focal fluid collection.   Musculoskeletal: No aggressive appearing focal osseous lesions. Mild thoracolumbar spondylosis.   IMPRESSION: 1. No acute abnormality. No  evidence of bowel obstruction or acute bowel inflammation. Normal appendix. 2. Moderate diffuse colonic stool volume, suggesting constipation. 3. Suggestion of diffuse hepatic steatosis. Liver surface is diffusely finely irregular, suggesting cirrhosis. No liver masses. Suggest correlation with liver function tests. Consider hepatic elastography for further liver fibrosis risk stratification, as clinically warranted. 4. Stable mild porta hepatis adenopathy, nonspecific, probably reactive. 5. Small hiatal hernia. 6. Aortic Atherosclerosis (ICD10-I70.0).     Electronically Signed   By: Ilona Sorrel M.D.   On: 11/15/2020 13:25     Assessment & Plan:  56 year old female with a history of GERD with Barrett's esophagus without dysplasia, history of pancreatitis, fatty liver, history of colon polyps who is here for follow-up.   Fatty liver --liver enzymes are normal.  Hepatic elastography did not show evidence of advanced fibrosis which is very reassuring.  We have discussed this today.  Clinically there is no signs of portal hypertension or advanced liver disease complications.  We discussed the importance of continuing to maintain control of blood pressure and blood sugar.  Her A1c has consistently decreased and is now 6.2.  Her liver enzymes can be  monitored over time --Monitor liver enzymes with routine primary care; no evidence for cirrhosis based on elastography --Risk factor modification as above  2.  Nausea vomiting --resolved.  Gastric emptying scan suggestive of dumping though she has never had gastric surgery.  Nausea vomiting can be associated with gastric dumping.  We can reevaluate this symptom should it recur.  3.  GERD with Barrett's esophagus --no dysplasia at surveillance in 2021.  3-year surveillance recommended.  Continue pantoprazole 40 mg twice daily  4.  History of pancreatitis --no recurrent upper abdominal pain.  CT with contrast in November 2022 showed a normal  pancreas without complication.  5.  Constipation --doing well with MiraLAX 17 g daily  6.  History of colon polyps --surveillance colonoscopy recommended around August 2025  6 to 23-month follow-up, sooner if needed  30 minutes total spent today including patient facing time, coordination of care, reviewing medical history/procedures/pertinent radiology studies, and documentation of the encounter.

## 2021-02-02 NOTE — Patient Instructions (Signed)
We have sent the following medications to your pharmacy for you to pick up at your convenience: pantoprazole 40 mg twice daily.   Please follow-up with Dr. Hilarie Fredrickson in 6-12 months.  [facility] providers would like to encourage you to use [facility] to communicate with providers for non-urgent requests or questions.  Due to long hold times on the telephone, sending your provider a message by [facility] may be a faster and more efficient way to get a response.  Please allow 48 business hours for a response.  Please remember that this is for non-urgent requests.

## 2021-02-11 ENCOUNTER — Telehealth: Payer: Self-pay | Admitting: Internal Medicine

## 2021-02-11 MED ORDER — DEXCOM G6 SENSOR MISC: 1.0000 | 3 refills | Status: DC

## 2021-02-11 NOTE — Telephone Encounter (Signed)
Script has been sent to [facility]

## 2021-02-11 NOTE — Telephone Encounter (Signed)
Pt is calling in stating that her Dexcom Sensor is running out today and would like to see if she can get some sent to the pharmacy.  Pharm:  [facility] on [address].

## 2021-02-19 ENCOUNTER — Other Ambulatory Visit: Payer: Self-pay | Admitting: Family Medicine

## 2021-02-24 MED ORDER — TRAMADOL HCL 50 MG PO TABS: 50.0000 mg | ORAL_TABLET | Freq: Two times a day (BID) | ORAL | 0 refills | Status: DC | PRN

## 2021-04-04 ENCOUNTER — Encounter: Payer: Self-pay | Admitting: Podiatry

## 2021-04-04 ENCOUNTER — Other Ambulatory Visit: Payer: Self-pay

## 2021-04-04 ENCOUNTER — Ambulatory Visit (INDEPENDENT_AMBULATORY_CARE_PROVIDER_SITE_OTHER): Payer: Medicaid Other | Admitting: Podiatry

## 2021-04-04 DIAGNOSIS — M79675 Pain in left toe(s): Secondary | ICD-10-CM

## 2021-04-04 DIAGNOSIS — B351 Tinea unguium: Secondary | ICD-10-CM

## 2021-04-04 DIAGNOSIS — M79674 Pain in right toe(s): Secondary | ICD-10-CM

## 2021-04-04 DIAGNOSIS — E1149 Type 2 diabetes mellitus with other diabetic neurological complication: Secondary | ICD-10-CM

## 2021-04-04 NOTE — Progress Notes (Signed)
Complaint:  Visit Type: Patient returns to my office for continued preventative foot care services. Complaint: Patient states" my nails have grown long and thick and become painful to walk and wear shoes" Patient has been diagnosed with DM with neuropathy. The patient presents for preventative foot care services.  Podiatric Exam: Vascular: dorsalis pedis and posterior tibial pulses are palpable bilateral. Capillary return is immediate. Temperature gradient is WNL. Skin turgor WNL  Sensorium: Diminished  Semmes Weinstein monofilament test. Normal tactile sensation bilaterally. Nail Exam: Pt has thick disfigured discolored nails with subungual debris noted bilateral entire nail hallux through fifth toenails Ulcer Exam: There is no evidence of ulcer or pre-ulcerative changes or infection. Orthopedic Exam: Muscle tone and strength are WNL. No limitations in general ROM. No crepitus or effusions noted. Foot type and digits show no abnormalities. HAV  B/L. Skin: No Porokeratosis. No infection or ulcers  Diagnosis:  Onychomycosis, , Pain in right toe, pain in left toes  Treatment & Plan Procedures and Treatment: Consent by patient was obtained for treatment procedures.   Debridement of mycotic and hypertrophic toenails, 1 through 5 bilateral and clearing of subungual debris. No ulceration, no infection noted.  Return Visit-Office Procedure: Patient instructed to return to the office for a follow up visit 3 months for continued evaluation and treatment.    Vanya Carberry DPM 

## 2021-04-25 ENCOUNTER — Ambulatory Visit: Payer: Medicaid Other | Admitting: Family Medicine

## 2021-04-27 ENCOUNTER — Ambulatory Visit (INDEPENDENT_AMBULATORY_CARE_PROVIDER_SITE_OTHER): Payer: Medicaid Other | Admitting: Family Medicine

## 2021-04-27 ENCOUNTER — Encounter: Payer: Self-pay | Admitting: Family Medicine

## 2021-04-27 VITALS — BP 113/72 | HR 87 | Temp 98.1°F | Resp 16 | Wt 271.0 lb

## 2021-04-27 DIAGNOSIS — E1169 Type 2 diabetes mellitus with other specified complication: Secondary | ICD-10-CM | POA: Diagnosis not present

## 2021-04-27 DIAGNOSIS — F329 Major depressive disorder, single episode, unspecified: Secondary | ICD-10-CM

## 2021-04-27 DIAGNOSIS — Z794 Long term (current) use of insulin: Secondary | ICD-10-CM | POA: Diagnosis not present

## 2021-04-27 DIAGNOSIS — E785 Hyperlipidemia, unspecified: Secondary | ICD-10-CM | POA: Diagnosis not present

## 2021-04-27 DIAGNOSIS — I10 Essential (primary) hypertension: Secondary | ICD-10-CM | POA: Diagnosis not present

## 2021-04-27 DIAGNOSIS — Z87891 Personal history of nicotine dependence: Secondary | ICD-10-CM

## 2021-04-27 LAB — POCT GLYCOSYLATED HEMOGLOBIN (HGB A1C): Hemoglobin A1C: 8.4 % — AB (ref 4.0–5.6)

## 2021-04-27 MED ORDER — NICOTINE 7 MG/24HR TD PT24: 7.0000 mg | MEDICATED_PATCH | Freq: Every day | TRANSDERMAL | 1 refills | Status: DC

## 2021-04-27 MED ORDER — TRAMADOL HCL 50 MG PO TABS: 50.0000 mg | ORAL_TABLET | Freq: Two times a day (BID) | ORAL | 0 refills | Status: DC | PRN

## 2021-04-27 MED ORDER — ACCU-CHEK GUIDE VI STRP: ORAL_STRIP | 12 refills | Status: DC

## 2021-04-27 NOTE — Progress Notes (Signed)
Patient is here for 3 month f/u  ?Patient said she has stop smoking but has started eating. Patient said that she is trying to figure out how to balance the both . ?

## 2021-04-28 ENCOUNTER — Encounter: Payer: Self-pay | Admitting: Family Medicine
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
## 2021-05-06 ENCOUNTER — Ambulatory Visit (INDEPENDENT_AMBULATORY_CARE_PROVIDER_SITE_OTHER): Payer: Medicaid Other | Admitting: Internal Medicine

## 2021-05-06 ENCOUNTER — Encounter: Payer: Self-pay | Admitting: Internal Medicine

## 2021-05-06 VITALS — BP 140/82 | HR 84 | Ht 66.0 in | Wt 277.0 lb

## 2021-05-06 DIAGNOSIS — E1165 Type 2 diabetes mellitus with hyperglycemia: Secondary | ICD-10-CM

## 2021-05-06 DIAGNOSIS — E1142 Type 2 diabetes mellitus with diabetic polyneuropathy: Secondary | ICD-10-CM | POA: Diagnosis not present

## 2021-05-06 DIAGNOSIS — E785 Hyperlipidemia, unspecified: Secondary | ICD-10-CM | POA: Diagnosis not present

## 2021-05-06 DIAGNOSIS — Z794 Long term (current) use of insulin: Secondary | ICD-10-CM

## 2021-05-06 DIAGNOSIS — E1169 Type 2 diabetes mellitus with other specified complication: Secondary | ICD-10-CM

## 2021-05-06 MED ORDER — LANTUS SOLOSTAR 100 UNIT/ML ~~LOC~~ SOPN: 64.0000 [IU] | PEN_INJECTOR | Freq: Every day | SUBCUTANEOUS | 6 refills | Status: DC

## 2021-05-06 MED ORDER — DAPAGLIFLOZIN PROPANEDIOL 10 MG PO TABS: 10.0000 mg | ORAL_TABLET | Freq: Every day | ORAL | 3 refills | Status: DC

## 2021-05-06 MED ORDER — DEXCOM G7 SENSOR MISC: 1.0000 | 3 refills | Status: DC

## 2021-05-06 MED ORDER — METFORMIN HCL ER 750 MG PO TB24: 750.0000 mg | ORAL_TABLET | Freq: Two times a day (BID) | ORAL | 3 refills | Status: DC

## 2021-05-06 MED ORDER — ATORVASTATIN CALCIUM 40 MG PO TABS: 40.0000 mg | ORAL_TABLET | Freq: Every day | ORAL | 3 refills | Status: DC

## 2021-05-06 MED ORDER — INSULIN PEN NEEDLE 31G X 5 MM MISC: 1.0000 | Freq: Four times a day (QID) | 3 refills | Status: DC
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
## 2021-05-06 NOTE — Patient Instructions (Addendum)
?-    Continue Metformin 750 mg to 1 tablet TWICE a day  ?- Continue  Lantus to 64 units ONCE a day  ?- Continue  Novolog 14 units with each meal  ?- Continue  farxiga 10 mg , 1 tablet daily  ? ? ?- Novolog correctional insulin: ADD extra units on insulin to your meal-time Novolog dose if your blood sugars are higher than 160. Use the scale below to help guide you:  ? ?Blood sugar before meal Number of units to inject  ?Less than 160 0 unit  ?161 - 190 1 units  ?191 - 220 2 units  ?221 - 250 3 units  ?251 - 280 4 units  ?281 - 310 5 units  ?311 - 340 6 units  ?341 - 370 7 units  ?371 - 400 8 units  ? ? ? ? ?HOW TO TREAT LOW BLOOD SUGARS (Blood sugar LESS THAN 70 MG/DL) ?Please follow the RULE OF 15 for the treatment of hypoglycemia treatment (when your (blood sugars are less than 70 mg/dL)  ? ?STEP 1: Take 15 grams of carbohydrates when your blood sugar is low, which includes:  ?3-4 GLUCOSE TABS  OR ?3-4 OZ OF JUICE OR REGULAR SODA OR ?ONE TUBE OF GLUCOSE GEL   ? ?STEP 2: RECHECK blood sugar in 15 MINUTES ?STEP 3: If your blood sugar is still low at the 15 minute recheck --> then, go back to STEP 1 and treat AGAIN with another 15 grams of carbohydrates. ? ?

## 2021-06-01 ENCOUNTER — Ambulatory Visit: Payer: Medicaid Other | Admitting: Internal Medicine

## 2021-06-07 ENCOUNTER — Encounter: Payer: Self-pay | Admitting: Family Medicine

## 2021-06-07 ENCOUNTER — Ambulatory Visit (INDEPENDENT_AMBULATORY_CARE_PROVIDER_SITE_OTHER): Payer: Medicaid Other | Admitting: Family Medicine

## 2021-06-07 VITALS — BP 99/66 | HR 76 | Temp 98.1°F | Resp 16 | Wt 283.0 lb

## 2021-06-07 DIAGNOSIS — R319 Hematuria, unspecified: Secondary | ICD-10-CM | POA: Diagnosis not present

## 2021-06-07 DIAGNOSIS — M5416 Radiculopathy, lumbar region: Secondary | ICD-10-CM

## 2021-06-07 DIAGNOSIS — M545 Low back pain, unspecified: Secondary | ICD-10-CM | POA: Diagnosis not present

## 2021-06-07 LAB — POCT URINALYSIS DIP (CLINITEK)
Bilirubin, UA: NEGATIVE
Glucose, UA: 500 mg/dL — AB
Ketones, POC UA: NEGATIVE mg/dL
Leukocytes, UA: NEGATIVE
Nitrite, UA: NEGATIVE
POC PROTEIN,UA: NEGATIVE
Spec Grav, UA: 1.015 (ref 1.010–1.025)
Urobilinogen, UA: 0.2 E.U./dL
pH, UA: 6.5 (ref 5.0–8.0)

## 2021-06-07 MED ORDER — NITROFURANTOIN MONOHYD MACRO 100 MG PO CAPS: 100.0000 mg | ORAL_CAPSULE | Freq: Two times a day (BID) | ORAL | 0 refills | Status: DC

## 2021-06-07 MED ORDER — TRIAMCINOLONE ACETONIDE 40 MG/ML IJ SUSP
40.0000 mg | Freq: Once | INTRAMUSCULAR | Status: AC
  Administered 2021-06-07: 40 mg via INTRAMUSCULAR

## 2021-06-07 MED ORDER — ACCU-CHEK FASTCLIX LANCETS MISC: 1 refills | Status: DC
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
## 2021-06-07 NOTE — Progress Notes (Unsigned)
Patient c/o pain in her back . Patient said that she has been taking OTC medication with no relief.  Patient given triamcinolone Acetonide 40 mg for back pain

## 2021-06-08 ENCOUNTER — Encounter: Payer: Self-pay | Admitting: Family Medicine

## 2021-07-04 ENCOUNTER — Ambulatory Visit (INDEPENDENT_AMBULATORY_CARE_PROVIDER_SITE_OTHER): Payer: Medicaid Other | Admitting: Podiatry

## 2021-07-04 ENCOUNTER — Encounter: Payer: Self-pay | Admitting: Podiatry

## 2021-07-04 DIAGNOSIS — B351 Tinea unguium: Secondary | ICD-10-CM | POA: Diagnosis not present

## 2021-07-04 DIAGNOSIS — M79675 Pain in left toe(s): Secondary | ICD-10-CM

## 2021-07-04 DIAGNOSIS — M79674 Pain in right toe(s): Secondary | ICD-10-CM | POA: Diagnosis not present

## 2021-07-14 ENCOUNTER — Other Ambulatory Visit: Payer: Self-pay | Admitting: Family Medicine

## 2021-07-14 DIAGNOSIS — E1169 Type 2 diabetes mellitus with other specified complication: Secondary | ICD-10-CM

## 2021-07-19 ENCOUNTER — Ambulatory Visit (INDEPENDENT_AMBULATORY_CARE_PROVIDER_SITE_OTHER): Payer: Medicaid Other | Admitting: Family Medicine

## 2021-07-19 VITALS — BP 110/72 | HR 73 | Temp 98.1°F | Resp 16 | Wt 283.0 lb

## 2021-07-19 DIAGNOSIS — E785 Hyperlipidemia, unspecified: Secondary | ICD-10-CM

## 2021-07-19 DIAGNOSIS — F329 Major depressive disorder, single episode, unspecified: Secondary | ICD-10-CM

## 2021-07-19 DIAGNOSIS — I10 Essential (primary) hypertension: Secondary | ICD-10-CM

## 2021-07-19 DIAGNOSIS — Z794 Long term (current) use of insulin: Secondary | ICD-10-CM

## 2021-07-19 DIAGNOSIS — E1169 Type 2 diabetes mellitus with other specified complication: Secondary | ICD-10-CM | POA: Diagnosis not present

## 2021-07-19 LAB — POCT GLYCOSYLATED HEMOGLOBIN (HGB A1C): Hemoglobin A1C: 8.6 % — AB (ref 4.0–5.6)

## 2021-07-19 MED ORDER — MELOXICAM 7.5 MG PO TABS: 7.5000 mg | ORAL_TABLET | Freq: Every day | ORAL | 2 refills | Status: DC | PRN

## 2021-07-19 MED ORDER — INSULIN PEN NEEDLE 31G X 5 MM MISC
1.0000 | Freq: Four times a day (QID) | 3 refills | Status: DC
Start: 2021-07-19 — End: 2021-11-07

## 2021-07-19 MED ORDER — BUPROPION HCL ER (XL) 300 MG PO TB24: 300.0000 mg | ORAL_TABLET | Freq: Every day | ORAL | 1 refills | Status: DC

## 2021-07-19 MED ORDER — HYDROXYZINE PAMOATE 25 MG PO CAPS: 25.0000 mg | ORAL_CAPSULE | Freq: Three times a day (TID) | ORAL | 3 refills | Status: AC | PRN

## 2021-07-19 MED ORDER — PANTOPRAZOLE SODIUM 40 MG PO TBEC: 40.0000 mg | DELAYED_RELEASE_TABLET | Freq: Two times a day (BID) | ORAL | 11 refills | Status: DC

## 2021-07-19 MED ORDER — METFORMIN HCL ER 750 MG PO TB24: 750.0000 mg | ORAL_TABLET | Freq: Two times a day (BID) | ORAL | 3 refills | Status: DC

## 2021-07-19 MED ORDER — BUSPIRONE HCL 5 MG PO TABS
5.0000 mg | ORAL_TABLET | Freq: Two times a day (BID) | ORAL | 3 refills | Status: DC
Start: 2021-07-19 — End: 2021-11-07

## 2021-07-19 MED ORDER — DULOXETINE HCL 60 MG PO CPEP: 60.0000 mg | ORAL_CAPSULE | Freq: Every day | ORAL | 1 refills | Status: DC

## 2021-07-19 MED ORDER — PROPRANOLOL HCL 10 MG PO TABS: 10.0000 mg | ORAL_TABLET | Freq: Two times a day (BID) | ORAL | 1 refills | Status: DC | PRN

## 2021-07-19 MED ORDER — ACCU-CHEK FASTCLIX LANCETS MISC: 1 refills | Status: DC

## 2021-07-19 MED ORDER — DEXCOM G7 SENSOR MISC: 1.0000 | 3 refills | Status: DC

## 2021-07-19 MED ORDER — DAPAGLIFLOZIN PROPANEDIOL 10 MG PO TABS: 10.0000 mg | ORAL_TABLET | Freq: Every day | ORAL | 3 refills | Status: DC

## 2021-07-19 MED ORDER — LISINOPRIL 10 MG PO TABS: ORAL_TABLET | ORAL | 1 refills | Status: DC

## 2021-07-19 MED ORDER — NOVOLOG FLEXPEN 100 UNIT/ML ~~LOC~~ SOPN: PEN_INJECTOR | SUBCUTANEOUS | 3 refills | Status: DC

## 2021-07-19 MED ORDER — ATORVASTATIN CALCIUM 40 MG PO TABS: 40.0000 mg | ORAL_TABLET | Freq: Every day | ORAL | 3 refills | Status: DC

## 2021-07-19 MED ORDER — ACCU-CHEK GUIDE VI STRP: ORAL_STRIP | 12 refills | Status: DC

## 2021-07-20 ENCOUNTER — Encounter: Payer: Self-pay | Admitting: Family Medicine
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
## 2021-08-03 ENCOUNTER — Other Ambulatory Visit (HOSPITAL_COMMUNITY): Payer: Self-pay

## 2021-08-03 ENCOUNTER — Telehealth: Payer: Self-pay

## 2021-08-03 NOTE — Telephone Encounter (Signed)
Patient Advocate Encounter   Received notification that prior authorization is required for Dexcom G7 Sensor  Submitted to [employer] on  08/03/2021 Confirmation #: [id number] W Status is pending  Clista Bernhardt, [place] Patient Advocate Specialist [place] Patient Advocate Team Phone: [phone]   Fax: [phone]

## 2021-08-03 NOTE — Telephone Encounter (Signed)
Patient Advocate Encounter  Prior Authorization for [employer] has been approved.   Effective: 08/03/2021 to 01/30/2022.  Clista Bernhardt, CPhT Pharmacy Patient Advocate Specialist [place] Patient Advocate Team Phone: [phone]   Fax: [phone]

## 2021-08-03 NOTE — Telephone Encounter (Signed)
Per patient PA is needed for Dexcom sensors.

## 2021-08-03 NOTE — Telephone Encounter (Signed)
Patient has been notified

## 2021-08-19 ENCOUNTER — Other Ambulatory Visit: Payer: Self-pay | Admitting: Family Medicine

## 2021-08-19 NOTE — Telephone Encounter (Signed)
Requested Prescriptions  Pending Prescriptions Disp Refills  . propranolol (INDERAL) 10 MG tablet [Pharmacy Med Name: PROPRANOLOL '10MG'$  TABLETS] 180 tablet 0    Sig: TAKE 1 TABLET(10 MG) BY MOUTH TWICE DAILY AS NEEDED     Cardiovascular:  Beta Blockers Passed - 08/19/2021 10:13 AM      Passed - Last BP in normal range    BP Readings from Last 1 Encounters:  07/19/21 110/72         Passed - Last Heart Rate in normal range    Pulse Readings from Last 1 Encounters:  07/19/21 73         Passed - Valid encounter within last 6 months    Recent Outpatient Visits          1 month ago Type 2 diabetes mellitus with other specified complication, with long-term current use of insulin ([place])   Primary Care at [facility], Clyde Canterbury, MD   2 months ago Bilateral low back pain without sciatica, unspecified chronicity   Primary Care at [place], MD   3 months ago Type 2 diabetes mellitus with other specified complication, with long-term current use of insulin [facility])   Primary Care at [facility], Clyde Canterbury, MD   6 months ago Type 2 diabetes mellitus with other specified complication, with long-term current use of insulin [facility])   Primary Care at [facility], MD   9 months ago Right hip pain   Primary Care at [facility], MD      Future Appointments            In 1 month Dorna Mai, MD Primary Care at [facility]

## 2021-08-20 ENCOUNTER — Encounter: Payer: Self-pay | Admitting: *Deleted

## 2021-08-20 LAB — POCT GLYCOSYLATED HEMOGLOBIN (HGB A1C): Hemoglobin-A1c: 8.9

## 2021-09-05 ENCOUNTER — Telehealth: Payer: Self-pay | Admitting: *Deleted

## 2021-09-05 NOTE — Telephone Encounter (Signed)
Seen [facility] by Nena Polio, NP. Pt has not taken Lantus in months, pharm says its not there. AIC was 8.7. Severe depression. Just put her sister in Hospice with stage 4 cancer and HIV. She is on Wellbutrin but NP states she is devastated with grief, cried an hour at health fair. Pt also said she was told she had Cervical Cancer in 2015 and it was not treated and she has not informed any doctor of it so it has never been re-checked. Pt is also looking for more help learning how to cook meals for Diabetes. Not just information, maybe Nutritional Counselor. Has appt but not until October. NP states she needs assistance sooner.

## 2021-09-05 NOTE — Telephone Encounter (Signed)
Message sent to [facility] by mistake.

## 2021-09-06 NOTE — Telephone Encounter (Signed)
Tara Keller can you schedule this patient please.

## 2021-09-08 ENCOUNTER — Encounter: Payer: Self-pay | Admitting: Family Medicine

## 2021-09-08 ENCOUNTER — Ambulatory Visit (INDEPENDENT_AMBULATORY_CARE_PROVIDER_SITE_OTHER): Payer: Medicaid Other | Admitting: Family Medicine

## 2021-09-08 VITALS — BP 137/78 | HR 79 | Temp 98.1°F | Resp 16 | Wt 291.2 lb

## 2021-09-08 DIAGNOSIS — F329 Major depressive disorder, single episode, unspecified: Secondary | ICD-10-CM | POA: Diagnosis not present

## 2021-09-08 DIAGNOSIS — I10 Essential (primary) hypertension: Secondary | ICD-10-CM | POA: Diagnosis not present

## 2021-09-08 DIAGNOSIS — E1169 Type 2 diabetes mellitus with other specified complication: Secondary | ICD-10-CM

## 2021-09-08 DIAGNOSIS — Z794 Long term (current) use of insulin: Secondary | ICD-10-CM

## 2021-09-08 MED ORDER — METFORMIN HCL ER 750 MG PO TB24: 750.0000 mg | ORAL_TABLET | Freq: Two times a day (BID) | ORAL | 1 refills | Status: DC

## 2021-09-08 MED ORDER — CLONAZEPAM 0.5 MG PO TABS: 0.5000 mg | ORAL_TABLET | Freq: Two times a day (BID) | ORAL | 0 refills | Status: DC | PRN

## 2021-09-08 MED ORDER — LANTUS SOLOSTAR 100 UNIT/ML ~~LOC~~ SOPN: 64.0000 [IU] | PEN_INJECTOR | Freq: Every day | SUBCUTANEOUS | 0 refills | Status: DC
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
## 2021-09-22 ENCOUNTER — Ambulatory Visit: Payer: Self-pay

## 2021-09-22 NOTE — Telephone Encounter (Signed)
  Chief Complaint: Right flank pain, urinary frequency  Symptoms: Pain, and urinary frequency Frequency: 4 days Pertinent Negatives: Patient denies fever Disposition: '[]'$ ED /'[x]'$ Urgent Care (no appt availability in office) / '[]'$ Appointment(In office/virtual)/ '[]'$  [facility]/ '[]'$ Home Care/ '[]'$ Refused Recommended Disposition /'[]'$ [facility]/ '[]'$  Follow-up with PCP Additional Notes: Pt has had right flank pain and urinary frequency for 4 days.    Summary: back discomfort    The patient is experiencing discomfort in their right side and back   The patient has experienced the discomfort for roughly 4 days   The patient shares that they are experiencing back discomfort and frequent urination   Please contact the patient further when possible      Reason for Disposition  Side (flank) or lower back pain present  Answer Assessment - Initial Assessment Questions 1. SYMPTOM: "What's the main symptom you're concerned about?" (e.g., frequency, incontinence)     Pain and urinary frequency 2. ONSET: "When did the  pain  start?"     4 days 3. PAIN: "Is there any pain?" If Yes, ask: "How bad is it?" (Scale: 1-10; mild, moderate, severe)     9.5/10 4. CAUSE: "What do you think is causing the symptoms?"     UTI 5. OTHER SYMPTOMS: "Do you have any other symptoms?" (e.g., blood in urine, fever, flank pain, pain with urination)     Right flank pain 6. PREGNANCY: "Is there any chance you are pregnant?" "When was your last menstrual period?"     no  Protocols used: Urinary Symptoms-A-AH

## 2021-09-22 NOTE — Telephone Encounter (Signed)
Summary: back discomfort   The patient is experiencing discomfort in their right side and back   The patient has experienced the discomfort for roughly 4 days   The patient shares that they are experiencing back discomfort and frequent urination   Please contact the patient further when possible       Called pt - LMOM to return call

## 2021-09-22 NOTE — Telephone Encounter (Signed)
Summary: back discomfort    The patient is experiencing discomfort in their right side and back   The patient has experienced the discomfort for roughly 4 days   The patient shares that they are experiencing back discomfort and frequent urination   Please contact the patient further when possible      Called pt [email or web address]

## 2021-09-23 ENCOUNTER — Encounter (HOSPITAL_COMMUNITY): Payer: Self-pay

## 2021-09-23 ENCOUNTER — Ambulatory Visit (INDEPENDENT_AMBULATORY_CARE_PROVIDER_SITE_OTHER): Payer: Medicaid Other

## 2021-09-23 ENCOUNTER — Ambulatory Visit (HOSPITAL_COMMUNITY)
Admission: EM | Admit: 2021-09-23 | Discharge: 2021-09-23 | Disposition: A | Discharge status: Home / Self Care | Payer: Medicaid Other | Attending: Emergency Medicine | Admitting: Emergency Medicine

## 2021-09-23 DIAGNOSIS — R3129 Other microscopic hematuria: Secondary | ICD-10-CM | POA: Diagnosis not present

## 2021-09-23 DIAGNOSIS — M5441 Lumbago with sciatica, right side: Secondary | ICD-10-CM | POA: Diagnosis not present

## 2021-09-23 DIAGNOSIS — R109 Unspecified abdominal pain: Secondary | ICD-10-CM

## 2021-09-23 LAB — POCT URINALYSIS DIPSTICK, ED / UC
Bilirubin Urine: NEGATIVE
Glucose, UA: 500 mg/dL — AB
Ketones, ur: NEGATIVE mg/dL
Leukocytes,Ua: NEGATIVE
Nitrite: NEGATIVE
Protein, ur: NEGATIVE mg/dL
Specific Gravity, Urine: 1.01 (ref 1.005–1.030)
Urobilinogen, UA: 0.2 mg/dL (ref 0.0–1.0)
pH: 7 (ref 5.0–8.0)

## 2021-09-23 MED ORDER — NAPROXEN 500 MG PO TABS: 500.0000 mg | ORAL_TABLET | Freq: Two times a day (BID) | ORAL | 0 refills | Status: DC

## 2021-09-23 MED ORDER — ACETAMINOPHEN 325 MG PO TABS
ORAL_TABLET | ORAL | Status: AC
  Filled 2021-09-23: qty 3

## 2021-09-23 MED ORDER — KETOROLAC TROMETHAMINE 30 MG/ML IJ SOLN
INTRAMUSCULAR | Status: AC
  Filled 2021-09-23: qty 1

## 2021-09-23 MED ORDER — TIZANIDINE HCL 4 MG PO TABS: 4.0000 mg | ORAL_TABLET | Freq: Three times a day (TID) | ORAL | 0 refills | Status: DC | PRN

## 2021-09-23 MED ORDER — TAMSULOSIN HCL 0.4 MG PO CAPS: 0.4000 mg | ORAL_CAPSULE | Freq: Every day | ORAL | 0 refills | Status: AC

## 2021-09-23 MED ORDER — KETOROLAC TROMETHAMINE 30 MG/ML IJ SOLN
30.0000 mg | Freq: Once | INTRAMUSCULAR | Status: AC
  Administered 2021-09-23: 30 mg via INTRAMUSCULAR

## 2021-09-23 MED ORDER — ACETAMINOPHEN 325 MG PO TABS
975.0000 mg | ORAL_TABLET | Freq: Once | ORAL | Status: AC
  Administered 2021-09-23: 975 mg via ORAL
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
## 2021-09-23 NOTE — Discharge Instructions (Addendum)
You had some blood in your urine, but no evidence of infection.  There is no evidence of kidney stone on x-ray.  However as we discussed, not all kidney stones are picked up on x-ray.  Take Naprosyn with 1000 mg of Tylenol twice a day.  You may take an additional 1000 mg of Tylenol 2 more times during the day as needed for pain.  Zanaflex for muscle spasms, Flomax will help you pass the kidney stone.  Continue drinking plenty of extra electrolyte containing fluids.  Go to the ER for the signs symptoms we discussed.

## 2021-09-23 NOTE — ED Triage Notes (Signed)
Patient having right flank pain onset 5 days. Patient having urinary frequency, no pressure or painful urination.   Patient took tylenol and ibuprofen but has not helped.

## 2021-09-25 ENCOUNTER — Other Ambulatory Visit: Payer: Self-pay | Admitting: Internal Medicine

## 2021-10-04 ENCOUNTER — Ambulatory Visit: Payer: Medicaid Other | Admitting: Podiatry

## 2021-10-05 ENCOUNTER — Ambulatory Visit (INDEPENDENT_AMBULATORY_CARE_PROVIDER_SITE_OTHER): Payer: Medicaid Other | Admitting: Podiatry

## 2021-10-05 DIAGNOSIS — M79674 Pain in right toe(s): Secondary | ICD-10-CM

## 2021-10-05 DIAGNOSIS — B351 Tinea unguium: Secondary | ICD-10-CM

## 2021-10-05 DIAGNOSIS — E1149 Type 2 diabetes mellitus with other diabetic neurological complication: Secondary | ICD-10-CM

## 2021-10-05 DIAGNOSIS — M79675 Pain in left toe(s): Secondary | ICD-10-CM

## 2021-10-05 NOTE — Progress Notes (Signed)
Complaint:  Visit Type: Patient returns to my office for continued preventative foot care services. Complaint: Patient states" my nails have grown long and thick and become painful to walk and wear shoes" Patient has been diagnosed with DM with neuropathy. The patient presents for preventative foot care services.  Podiatric Exam: Vascular: dorsalis pedis and posterior tibial pulses are palpable bilateral. Capillary return is immediate. Temperature gradient is WNL. Skin turgor WNL  Sensorium: Diminished  Semmes Weinstein monofilament test. Normal tactile sensation bilaterally. Nail Exam: Pt has thick disfigured discolored nails with subungual debris noted bilateral entire nail hallux through fifth toenails Ulcer Exam: There is no evidence of ulcer or pre-ulcerative changes or infection. Orthopedic Exam: Muscle tone and strength are WNL. No limitations in general ROM. No crepitus or effusions noted. Foot type and digits show no abnormalities. HAV  B/L. Skin: No Porokeratosis. No infection or ulcers  Diagnosis:  Onychomycosis, , Pain in right toe, pain in left toes  Treatment & Plan Procedures and Treatment: Consent by patient was obtained for treatment procedures.   Debridement of mycotic and hypertrophic toenails, 1 through 5 bilateral and clearing of subungual debris. No ulceration, no infection noted.  Return Visit-Office Procedure: Patient instructed to return to the office for a follow up visit 3 months for continued evaluation and treatment.    Abigayl Hor DPM 

## 2021-10-14 ENCOUNTER — Other Ambulatory Visit: Payer: Self-pay | Admitting: Family Medicine

## 2021-10-17 ENCOUNTER — Encounter: Payer: Self-pay | Admitting: Family Medicine

## 2021-10-17 ENCOUNTER — Ambulatory Visit (INDEPENDENT_AMBULATORY_CARE_PROVIDER_SITE_OTHER): Payer: Medicaid Other

## 2021-10-17 ENCOUNTER — Ambulatory Visit: Payer: Medicaid Other | Admitting: Family Medicine

## 2021-10-17 ENCOUNTER — Ambulatory Visit (INDEPENDENT_AMBULATORY_CARE_PROVIDER_SITE_OTHER): Payer: Medicaid Other | Admitting: Family Medicine

## 2021-10-17 VITALS — BP 122/74 | HR 61 | Temp 98.1°F | Resp 16 | Wt 300.2 lb

## 2021-10-17 DIAGNOSIS — M545 Low back pain, unspecified: Secondary | ICD-10-CM

## 2021-10-17 DIAGNOSIS — I10 Essential (primary) hypertension: Secondary | ICD-10-CM

## 2021-10-17 DIAGNOSIS — R053 Chronic cough: Secondary | ICD-10-CM | POA: Diagnosis not present

## 2021-10-17 DIAGNOSIS — F329 Major depressive disorder, single episode, unspecified: Secondary | ICD-10-CM

## 2021-10-17 DIAGNOSIS — E1169 Type 2 diabetes mellitus with other specified complication: Secondary | ICD-10-CM | POA: Diagnosis not present

## 2021-10-17 DIAGNOSIS — E785 Hyperlipidemia, unspecified: Secondary | ICD-10-CM | POA: Diagnosis not present

## 2021-10-17 DIAGNOSIS — Z794 Long term (current) use of insulin: Secondary | ICD-10-CM

## 2021-10-17 DIAGNOSIS — Z87891 Personal history of nicotine dependence: Secondary | ICD-10-CM

## 2021-10-17 LAB — POCT URINALYSIS DIP (CLINITEK)
Bilirubin, UA: NEGATIVE
Glucose, UA: 500 mg/dL — AB
Ketones, POC UA: NEGATIVE mg/dL
Leukocytes, UA: NEGATIVE
Nitrite, UA: NEGATIVE
POC PROTEIN,UA: NEGATIVE
Spec Grav, UA: >=1.03 — AB (ref 1.010–1.025)
Urobilinogen, UA: 1 E.U./dL
pH, UA: 7.5 (ref 5.0–8.0)
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
## 2021-10-17 NOTE — Progress Notes (Unsigned)
Patient is here for follow-up visit. Patient  has no other issues and said the medication is doing him very well.

## 2021-10-18 ENCOUNTER — Ambulatory Visit (INDEPENDENT_AMBULATORY_CARE_PROVIDER_SITE_OTHER): Payer: Medicaid Other

## 2021-10-18 ENCOUNTER — Encounter (HOSPITAL_COMMUNITY): Payer: Self-pay | Admitting: Emergency Medicine

## 2021-10-18 ENCOUNTER — Other Ambulatory Visit: Payer: Self-pay | Admitting: Family Medicine

## 2021-10-18 ENCOUNTER — Ambulatory Visit
Admission: EM | Admit: 2021-10-18 | Discharge: 2021-10-18 | Payer: Medicaid Other | Attending: Physician Assistant | Admitting: Physician Assistant

## 2021-10-18 ENCOUNTER — Emergency Department (HOSPITAL_COMMUNITY)
Admission: EM | Admit: 2021-10-18 | Discharge: 2021-10-18 | Disposition: A | Discharge status: Home / Self Care | Payer: Medicaid Other | Attending: Emergency Medicine | Admitting: Emergency Medicine

## 2021-10-18 ENCOUNTER — Other Ambulatory Visit: Payer: Self-pay

## 2021-10-18 DIAGNOSIS — Z7984 Long term (current) use of oral hypoglycemic drugs: Secondary | ICD-10-CM | POA: Diagnosis not present

## 2021-10-18 DIAGNOSIS — Z794 Long term (current) use of insulin: Secondary | ICD-10-CM

## 2021-10-18 DIAGNOSIS — M79632 Pain in left forearm: Secondary | ICD-10-CM

## 2021-10-18 DIAGNOSIS — S6991XA Unspecified injury of right wrist, hand and finger(s), initial encounter: Secondary | ICD-10-CM | POA: Diagnosis present

## 2021-10-18 DIAGNOSIS — Z7982 Long term (current) use of aspirin: Secondary | ICD-10-CM | POA: Diagnosis not present

## 2021-10-18 DIAGNOSIS — S62609A Fracture of unspecified phalanx of unspecified finger, initial encounter for closed fracture: Secondary | ICD-10-CM

## 2021-10-18 DIAGNOSIS — S62622A Displaced fracture of medial phalanx of right middle finger, initial encounter for closed fracture: Secondary | ICD-10-CM | POA: Diagnosis not present

## 2021-10-18 DIAGNOSIS — S59912A Unspecified injury of left forearm, initial encounter: Secondary | ICD-10-CM | POA: Diagnosis not present

## 2021-10-18 DIAGNOSIS — S5012XA Contusion of left forearm, initial encounter: Secondary | ICD-10-CM | POA: Insufficient documentation

## 2021-10-18 DIAGNOSIS — E119 Type 2 diabetes mellitus without complications: Secondary | ICD-10-CM | POA: Insufficient documentation

## 2021-10-18 DIAGNOSIS — S62624A Displaced fracture of medial phalanx of right ring finger, initial encounter for closed fracture: Secondary | ICD-10-CM | POA: Diagnosis not present

## 2021-10-18 LAB — MICROALBUMIN / CREATININE URINE RATIO
Creatinine, Urine: 39.1 mg/dL
Microalb/Creat Ratio: <8 mg/g creat (ref 0–29)
Microalbumin, Urine: <3 ug/mL

## 2021-10-18 MED ORDER — HYDROCODONE-ACETAMINOPHEN 5-325 MG PO TABS: 1.0000 | ORAL_TABLET | Freq: Four times a day (QID) | ORAL | 0 refills | Status: DC | PRN

## 2021-10-18 NOTE — Discharge Instructions (Signed)
  Please report to ED for further evaluation of comminuted finger fracture.

## 2021-10-18 NOTE — Discharge Instructions (Addendum)
It was a pleasure taking care of you today. Your finger is broken. I have included the number of the hand surgeon. Call tomorrow to schedule an appointment. I am sending you home with pain medication. Take as needed for severe pain.  Pain medication can cause drowsiness do not drive or operate machinery while the medication.  Follow-up with PCP if symptoms not improve over the next few days.
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
## 2021-10-18 NOTE — ED Notes (Signed)
Patient is being discharged from the Urgent Care and sent to the Emergency Department via personal vehicle . Per Provider Ewell Poe, patient is in need of higher level of care due to displaced fracture. Patient is aware and verbalizes understanding of plan of care.  Vitals:   10/18/21 1401  BP: 135/78  Pulse: 91  Resp: 17  Temp: 98 F (36.7 C)  SpO2: 98%
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
## 2021-10-19 ENCOUNTER — Encounter: Payer: Self-pay | Admitting: Family Medicine

## 2021-10-20 DIAGNOSIS — G8918 Other acute postprocedural pain: Secondary | ICD-10-CM | POA: Insufficient documentation

## 2021-10-31 ENCOUNTER — Ambulatory Visit (INDEPENDENT_AMBULATORY_CARE_PROVIDER_SITE_OTHER): Payer: Medicaid Other | Admitting: Family Medicine

## 2021-10-31 ENCOUNTER — Encounter: Payer: Self-pay | Admitting: Family Medicine

## 2021-10-31 VITALS — BP 112/70 | HR 83 | Temp 98.1°F | Resp 16 | Wt 298.2 lb

## 2021-10-31 DIAGNOSIS — F329 Major depressive disorder, single episode, unspecified: Secondary | ICD-10-CM | POA: Diagnosis not present

## 2021-10-31 DIAGNOSIS — N950 Postmenopausal bleeding: Secondary | ICD-10-CM

## 2021-10-31 NOTE — Progress Notes (Signed)
Patient is here for follow-up per provider. Patient said she has been keeping the stress away from her.

## 2021-11-01 ENCOUNTER — Encounter: Payer: Self-pay | Admitting: Family Medicine
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
## 2021-11-03 DIAGNOSIS — S62629A Displaced fracture of medial phalanx of unspecified finger, initial encounter for closed fracture: Secondary | ICD-10-CM | POA: Insufficient documentation

## 2021-11-03 DIAGNOSIS — M79644 Pain in right finger(s): Secondary | ICD-10-CM | POA: Insufficient documentation

## 2021-11-07 ENCOUNTER — Ambulatory Visit (INDEPENDENT_AMBULATORY_CARE_PROVIDER_SITE_OTHER): Payer: Medicaid Other | Admitting: Internal Medicine

## 2021-11-07 ENCOUNTER — Encounter: Payer: Self-pay | Admitting: Internal Medicine

## 2021-11-07 VITALS — BP 126/82 | HR 72 | Ht 66.0 in | Wt 301.0 lb

## 2021-11-07 DIAGNOSIS — E1142 Type 2 diabetes mellitus with diabetic polyneuropathy: Secondary | ICD-10-CM

## 2021-11-07 DIAGNOSIS — Z794 Long term (current) use of insulin: Secondary | ICD-10-CM | POA: Diagnosis not present

## 2021-11-07 DIAGNOSIS — E1169 Type 2 diabetes mellitus with other specified complication: Secondary | ICD-10-CM | POA: Diagnosis not present

## 2021-11-07 DIAGNOSIS — E785 Hyperlipidemia, unspecified: Secondary | ICD-10-CM

## 2021-11-07 DIAGNOSIS — E1165 Type 2 diabetes mellitus with hyperglycemia: Secondary | ICD-10-CM | POA: Diagnosis not present

## 2021-11-07 LAB — BASIC METABOLIC PANEL
BUN: 15 mg/dL (ref 6–23)
CO2: 27 mEq/L (ref 19–32)
Calcium: 9.3 mg/dL (ref 8.4–10.5)
Chloride: 99 mEq/L (ref 96–112)
Creatinine, Ser: 0.73 mg/dL (ref 0.40–1.20)
GFR: 90.75 mL/min (ref >60.00)
Glucose, Bld: 111 mg/dL — ABNORMAL HIGH (ref 70–99)
Potassium: 3.6 mEq/L (ref 3.5–5.1)
Sodium: 133 mEq/L — ABNORMAL LOW (ref 135–145)

## 2021-11-07 LAB — MICROALBUMIN / CREATININE URINE RATIO
Creatinine,U: 26.2 mg/dL
Microalb Creat Ratio: 2.7 mg/g (ref 0.0–30.0)
Microalb, Ur: <0.7 mg/dL (ref 0.0–1.9)

## 2021-11-07 LAB — LIPID PANEL
Cholesterol: 158 mg/dL (ref 0–200)
HDL: 42.7 mg/dL (ref >39.00)
LDL Cholesterol: 83 mg/dL (ref 0–99)
NonHDL: 115.2
Total CHOL/HDL Ratio: 4
Triglycerides: 159 mg/dL — ABNORMAL HIGH (ref 0.0–149.0)
VLDL: 31.8 mg/dL (ref 0.0–40.0)

## 2021-11-07 LAB — POCT GLYCOSYLATED HEMOGLOBIN (HGB A1C): Hemoglobin A1C: 8.5 % — AB (ref 4.0–5.6)

## 2021-11-07 MED ORDER — NOVOLOG FLEXPEN 100 UNIT/ML ~~LOC~~ SOPN: PEN_INJECTOR | SUBCUTANEOUS | 3 refills | Status: DC

## 2021-11-07 MED ORDER — LANTUS SOLOSTAR 100 UNIT/ML ~~LOC~~ SOPN: 70.0000 [IU] | PEN_INJECTOR | Freq: Every day | SUBCUTANEOUS | 4 refills | Status: DC

## 2021-11-07 MED ORDER — DEXCOM G6 TRANSMITTER MISC: 1.0000 | 3 refills | Status: DC

## 2021-11-07 MED ORDER — METFORMIN HCL ER 750 MG PO TB24: 750.0000 mg | ORAL_TABLET | Freq: Two times a day (BID) | ORAL | 1 refills | Status: DC

## 2021-11-07 MED ORDER — DAPAGLIFLOZIN PROPANEDIOL 10 MG PO TABS: 10.0000 mg | ORAL_TABLET | Freq: Every day | ORAL | 3 refills | Status: DC

## 2021-11-07 MED ORDER — INSULIN PEN NEEDLE 31G X 5 MM MISC: 1.0000 | Freq: Four times a day (QID) | 3 refills | Status: DC

## 2021-11-07 MED ORDER — DEXCOM G6 SENSOR MISC: 1.0000 | 3 refills | Status: DC

## 2021-11-07 NOTE — Patient Instructions (Addendum)
-   Continue Metformin 750 mg to 1 tablet TWICE a day  - Increase Lantus to 70 units ONCE a day  - Continue  Novolog 14 units with each meal  - Continue  farxiga 10 mg , 1 tablet daily    - Novolog correctional insulin: ADD extra units on insulin to your meal-time Novolog dose if your blood sugars are higher than 160. Use the scale below to help guide you:   Blood sugar before meal Number of units to inject  Less than 160 0 unit  161 - 190 1 units  191 - 220 2 units  221 - 250 3 units  251 - 280 4 units  281 - 310 5 units  311 - 340 6 units  341 - 370 7 units  371 - 400 8 units      HOW TO TREAT LOW BLOOD SUGARS (Blood sugar LESS THAN 70 MG/DL) Please follow the RULE OF 15 for the treatment of hypoglycemia treatment (when your (blood sugars are less than 70 mg/dL)   STEP 1: Take 15 grams of carbohydrates when your blood sugar is low, which includes:  3-4 GLUCOSE TABS  OR 3-4 OZ OF JUICE OR REGULAR SODA OR ONE TUBE OF GLUCOSE GEL    STEP 2: RECHECK blood sugar in 15 MINUTES STEP 3: If your blood sugar is still low at the 15 minute recheck --> then, go back to STEP 1 and treat AGAIN with another 15 grams of carbohydrates. 
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
## 2021-11-08 MED ORDER — ATORVASTATIN CALCIUM 40 MG PO TABS: 40.0000 mg | ORAL_TABLET | Freq: Every day | ORAL | 3 refills | Status: DC

## 2021-11-21 ENCOUNTER — Ambulatory Visit: Payer: Medicaid Other | Attending: Family Medicine | Admitting: Pharmacist

## 2021-11-21 DIAGNOSIS — E11649 Type 2 diabetes mellitus with hypoglycemia without coma: Secondary | ICD-10-CM | POA: Insufficient documentation

## 2021-11-21 DIAGNOSIS — Z794 Long term (current) use of insulin: Secondary | ICD-10-CM

## 2021-11-21 DIAGNOSIS — E1142 Type 2 diabetes mellitus with diabetic polyneuropathy: Secondary | ICD-10-CM

## 2021-11-21 DIAGNOSIS — Z713 Dietary counseling and surveillance: Secondary | ICD-10-CM | POA: Insufficient documentation

## 2021-11-21 MED ORDER — METFORMIN HCL ER 500 MG PO TB24: 1000.0000 mg | ORAL_TABLET | Freq: Two times a day (BID) | ORAL | 1 refills | Status: DC

## 2021-11-21 NOTE — Progress Notes (Signed)
    S:    No chief complaint on file.  56 y.o. female who presents for diabetes evaluation, education, and management.  PMH is significant for T2DM (followed by endocrinology), endometrial cancer, and h/o of pancreatitis.  Patient was referred and last seen by Primary Care Provider, Dr. Redmond Pulling, on 10/17/2021. Last seen by endocrinology on 11/07/2021.   At last visit, with endocrinology, Lantus was increased from 64 to 70 units daily.   Today, patient arrives in good spirits and presents without any assistance. She endorses increased stress due to recent loss of her mother and sister. She has joined a gym and she enrolled at a class at [facility] to learn to cook healthy. Quit smoking in March 2023.    Patient reports Diabetes was diagnosed in 2004.   Current diabetes medications include: Lantus 70 units at night, Novolog per sliding scale, Farxiga 10 mg daily, metformin 750 mg BID   Patient reports adherence to taking all medications as prescribed.   Insurance coverage: Medicaid   Patient denies hypoglycemic events.  Reported home fasting blood sugars: 200s  Reported 2 hour post-meal/random blood sugars: 200s   Patient reports nocturia (nighttime urination).  Patient reports neuropathy (nerve pain). Patient denies visual changes. Patient reports self foot exams.   Patient-reported exercise habits: started going to the gym with her daughter. Workout 3x/night   O:  Lab Results  Component Value Date   HGBA1C 8.5 (A) 11/07/2021   Lipid Panel     Component Value Date/Time   CHOL 158 11/07/2021 1418   CHOL 126 10/22/2019 1551   TRIG 159.0 (H) 11/07/2021 1418   HDL 42.70 11/07/2021 1418   HDL 39 (L) 10/22/2019 1551   CHOLHDL 4 11/07/2021 1418   VLDL 31.8 11/07/2021 1418   LDLCALC 83 11/07/2021 1418   LDLCALC 67 10/22/2019 1551    Clinical Atherosclerotic Cardiovascular Disease (ASCVD): No  The 10-year ASCVD risk score (Arnett DK, et al., 2019) is: 12.8%   Values used to  calculate the score:     Age: 43 years     Sex: Female     Is Non-Hispanic African American: Yes     Diabetic: Yes     Tobacco smoker: No     Systolic Blood Pressure: 389 mmHg     Is BP treated: Yes     HDL Cholesterol: 42.7 mg/dL     Total Cholesterol: 158 mg/dL   A/P: Diabetes longstanding currently uncontrolled based on A1c (8.5%). Patient is able to verbalize appropriate hypoglycemia management plan. Medication adherence appears appropriate. -Continued basal insulin Lantus 70 units daily.  -Continued Novlog per sliding scale.  - GLP 1 agonist and DPP 4 inhibitors are contraindicated due to history of pancreatitis  -Continued Farxiga 10 mg daily. Counseled on sick day rules. -Increased dose of metformin to 1000 mg BID. eGFR 90 on 11/07/2021. -Extensively discussed pathophysiology of diabetes, recommended lifestyle interventions, dietary effects on blood sugar control.  -Counseled on s/sx of and management of hypoglycemia.  -Next A1c anticipated January-February 2024.   Written patient instructions provided. Patient verbalized understanding of treatment plan.  Total time in face to face counseling 30 minutes.    Follow-up:  Pharmacist PRN. PCP clinic visit on 12/26/2021.  Endocrinology appointment: 05/15/2022  Joseph Art, Pharm.D. PGY-2 Ambulatory Care Pharmacy Resident 11/21/2021 4:53 PM

## 2021-12-06 ENCOUNTER — Encounter: Payer: Self-pay | Admitting: Obstetrics and Gynecology

## 2021-12-06 ENCOUNTER — Ambulatory Visit (INDEPENDENT_AMBULATORY_CARE_PROVIDER_SITE_OTHER): Payer: Medicaid Other | Admitting: Obstetrics and Gynecology

## 2021-12-06 VITALS — BP 97/55 | HR 70 | Ht 66.0 in | Wt 310.1 lb

## 2021-12-06 DIAGNOSIS — C541 Malignant neoplasm of endometrium: Secondary | ICD-10-CM | POA: Diagnosis not present
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
## 2021-12-12 ENCOUNTER — Ambulatory Visit
Admission: RE | Admit: 2021-12-12 | Discharge: 2021-12-12 | Disposition: A | Discharge status: Home / Self Care | Payer: Medicaid Other | Source: Ambulatory Visit | Attending: Obstetrics and Gynecology | Admitting: Obstetrics and Gynecology

## 2021-12-12 DIAGNOSIS — C541 Malignant neoplasm of endometrium: Secondary | ICD-10-CM | POA: Insufficient documentation

## 2021-12-16 ENCOUNTER — Telehealth: Payer: Self-pay | Admitting: *Deleted

## 2021-12-16 ENCOUNTER — Telehealth: Payer: Self-pay | Admitting: Obstetrics and Gynecology

## 2021-12-16 NOTE — Telephone Encounter (Signed)
Called to schedule the patient for a new patient appt with Dr Ernestina Patches. Patient is requesting a call from the referring office regarding her referral to our office before scheduling an appt.

## 2021-12-16 NOTE — Telephone Encounter (Signed)
Dr Currie Paris spoke with the patient, patient now scheduled for an appt

## 2021-12-16 NOTE — Telephone Encounter (Signed)
Called and confirmed ID. Contacted by [place] office that pt desire phone call from referring office prior to scheduling appointment. Reviewed [name] and concern for growth of endometrial cancer despite IUD in place as cause of bleeding. Recommend EMB and follow up with GYN ONC. Pt verbalized understanding and all questions answered. If ONC appt not available until after benign GYN appt, will complete EMB in office.

## 2021-12-19 ENCOUNTER — Inpatient Hospital Stay (HOSPITAL_BASED_OUTPATIENT_CLINIC_OR_DEPARTMENT_OTHER): Payer: Medicaid Other | Admitting: Gynecologic Oncology

## 2021-12-19 ENCOUNTER — Encounter: Payer: Self-pay | Admitting: Psychiatry

## 2021-12-19 ENCOUNTER — Inpatient Hospital Stay: Payer: Medicaid Other | Attending: Psychiatry | Admitting: Psychiatry

## 2021-12-19 VITALS — BP 129/65 | HR 76 | Temp 98.6°F | Resp 18 | Ht 66.0 in | Wt 301.0 lb

## 2021-12-19 DIAGNOSIS — E119 Type 2 diabetes mellitus without complications: Secondary | ICD-10-CM | POA: Insufficient documentation

## 2021-12-19 DIAGNOSIS — N950 Postmenopausal bleeding: Secondary | ICD-10-CM | POA: Diagnosis not present

## 2021-12-19 DIAGNOSIS — C541 Malignant neoplasm of endometrium: Secondary | ICD-10-CM | POA: Diagnosis present

## 2021-12-19 DIAGNOSIS — Z79890 Hormone replacement therapy (postmenopausal): Secondary | ICD-10-CM | POA: Diagnosis not present

## 2021-12-19 DIAGNOSIS — Z794 Long term (current) use of insulin: Secondary | ICD-10-CM

## 2021-12-19 DIAGNOSIS — Z6841 Body Mass Index (BMI) 40.0 and over, adult: Secondary | ICD-10-CM | POA: Insufficient documentation

## 2021-12-19 MED ORDER — TRAMADOL HCL 50 MG PO TABS: 50.0000 mg | ORAL_TABLET | Freq: Four times a day (QID) | ORAL | 0 refills | Status: DC | PRN

## 2021-12-19 MED ORDER — SENNOSIDES-DOCUSATE SODIUM 8.6-50 MG PO TABS: 2.0000 | ORAL_TABLET | Freq: Every day | ORAL | 0 refills | Status: DC

## 2021-12-19 NOTE — Patient Instructions (Signed)
Preparing for your Surgery  Plan for surgery on January 10, 2022 with Dr. [name] at [place] will be scheduled for robotic assisted total laparoscopic hysterectomy (removal of the uterus and cervix), bilateral salpingo-oophorectomy (removal of both ovaries and fallopian tubes), sentinel lymph node biopsy, possible dilation and curettage of the uterus with Mirena IUD placement.   Pre-operative Testing -You will receive a phone call from presurgical testing at [facility] to arrange for a pre-operative appointment and lab work.  -Bring your insurance card, copy of an advanced directive if applicable, medication list  -At that visit, you will be asked to sign a consent for a possible blood transfusion in case a transfusion becomes necessary during surgery.  The need for a blood transfusion is rare but having consent is a necessary part of your care.     -You should not be taking blood thinners or aspirin at least ten days prior to surgery unless instructed by your surgeon.  -Do not take supplements such as fish oil (omega 3), red yeast rice, turmeric before your surgery. You want to avoid medications with aspirin in them including headache powders such as BC or Goody's), Excedrin migraine.  Day Before Surgery at [place] will be asked to take in a light diet the day before surgery. You will be advised you can have clear liquids up until 3 hours before your surgery.    Eat a light diet the day before surgery.  Examples including soups, broths, toast, yogurt, mashed potatoes.  AVOID GAS PRODUCING FOODS. Things to avoid include carbonated beverages (fizzy beverages, sodas), raw fruits and raw vegetables (uncooked), or beans.   If your bowels are filled with gas, your surgeon will have difficulty visualizing your pelvic organs which increases your surgical risks.  Your role in recovery Your role is to become active as soon as directed by your doctor, while still  giving yourself time to heal.  Rest when you feel tired. You will be asked to do the following in order to speed your recovery:  - Cough and breathe deeply. This helps to clear and expand your lungs and can prevent pneumonia after surgery.  - [place]. Do mild physical activity. Walking or moving your legs help your circulation and body functions return to normal. Do not try to get up or walk alone the first time after surgery.   -If you develop swelling on one leg or the other, pain in the back of your leg, redness/warmth in one of your legs, please call the office or go to the Emergency Room to have a doppler to rule out a blood clot. For shortness of breath, chest pain-seek care in the Emergency Room as soon as possible. - Actively manage your pain. Managing your pain lets you move in comfort. We will ask you to rate your pain on a scale of zero to 10. It is your responsibility to tell your doctor or nurse where and how much you hurt so your pain can be treated.  Special Considerations -If you are diabetic, you may be placed on insulin after surgery to have closer control over your blood sugars to promote healing and recovery.  This does not mean that you will be discharged on insulin.  If applicable, your oral antidiabetics will be resumed when you are tolerating a solid diet.  -Your final pathology results from surgery should be available around one week after surgery and the results will be relayed  to you when available.  -FMLA forms can be faxed to [phone] and please allow 5-7 business days for completion.  Pain Management After Surgery -You have been prescribed your pain medication and bowel regimen medications before surgery so that you can have these available when you are discharged from the hospital. The pain medication is for use ONLY AFTER surgery and a new prescription will not be given.   -Make sure that you have Tylenol and Ibuprofen IF YOU ARE ABLE TO TAKE  THESE MEDICATIONS at home to use on a regular basis after surgery for pain control. We recommend alternating the medications every hour to six hours since they work differently and are processed in the body differently for pain relief.  -Review the attached handout on narcotic use and their risks and side effects.   Bowel Regimen -You have been prescribed Sennakot-S to take nightly to prevent constipation especially if you are taking the narcotic pain medication intermittently.  It is important to prevent constipation and drink adequate amounts of liquids. You can stop taking this medication when you are not taking pain medication and you are back on your normal bowel routine.  Risks of Surgery Risks of surgery are low but include bleeding, infection, damage to surrounding structures, re-operation, blood clots, and very rarely death.   Blood Transfusion Information (For the consent to be signed before surgery)  We will be checking your blood type before surgery so in case of emergencies, we will know what type of blood you would need.                                            WHAT IS A BLOOD TRANSFUSION?  A transfusion is the replacement of blood or some of its parts. Blood is made up of multiple cells which provide different functions. Red blood cells carry oxygen and are used for blood loss replacement. White blood cells fight against infection. Platelets control bleeding. Plasma helps clot blood. Other blood products are available for specialized needs, such as hemophilia or other clotting disorders. BEFORE THE TRANSFUSION  Who gives blood for transfusions?  You may be able to donate blood to be used at a later date on yourself (autologous donation). Relatives can be asked to donate blood. This is generally not any safer than if you have received blood from a stranger. The same precautions are taken to ensure safety when a relative's blood is donated. Healthy volunteers who are fully  evaluated to make sure their blood is safe. This is blood bank blood. Transfusion therapy is the safest it has ever been in the practice of medicine. Before blood is taken from a donor, a complete history is taken to make sure that person has no history of diseases nor engages in risky social behavior (examples are intravenous drug use or sexual activity with multiple partners). The donor's travel history is screened to minimize risk of transmitting infections, such as malaria. The donated blood is tested for signs of infectious diseases, such as HIV and hepatitis. The blood is then tested to be sure it is compatible with you in order to minimize the chance of a transfusion reaction. If you or a relative donates blood, this is often done in anticipation of surgery and is not appropriate for emergency situations. It takes many days to process the donated blood. RISKS AND COMPLICATIONS Although transfusion therapy is  very safe and saves many lives, the main dangers of transfusion include:  Getting an infectious disease. Developing a transfusion reaction. This is an allergic reaction to something in the blood you were given. Every precaution is taken to prevent this. The decision to have a blood transfusion has been considered carefully by your caregiver before blood is given. Blood is not given unless the benefits outweigh the risks.  AFTER SURGERY INSTRUCTIONS  Return to work: 4-6 weeks if applicable  Activity: 1. Be up and out of the bed during the day.  Take a nap if needed.  You may walk up steps but be careful and use the hand rail.  Stair climbing will tire you more than you think, you may need to stop part way and rest.   2. No lifting or straining for 6 weeks over 10 pounds. No pushing, pulling, straining for 6 weeks.  3. No driving for around 1 week(s).  Do not drive if you are taking narcotic pain medicine and make sure that your reaction time has returned.   4. You can shower as soon as  the next day after surgery. Shower daily.  Use your regular soap and water (not directly on the incision) and pat your incision(s) dry afterwards; don't rub.  No tub baths or submerging your body in water until cleared by your surgeon. If you have the soap that was given to you by pre-surgical testing that was used before surgery, you do not need to use it afterwards because this can irritate your incisions.   5. No sexual activity and nothing in the vagina for 8-10 weeks.  6. You may experience a small amount of clear drainage from your incisions, which is normal.  If the drainage persists, increases, or changes color please call the office.  7. Do not use creams, lotions, or ointments such as neosporin on your incisions after surgery until advised by your surgeon because they can cause removal of the dermabond glue on your incisions.    8. You may experience vaginal spotting after surgery or around the 6-8 week mark from surgery when the stitches at the top of the vagina begin to dissolve.  The spotting is normal but if you experience heavy bleeding, call our office.  9. Take Tylenol or ibuprofen first for pain if you are able to take these medications and only use narcotic pain medication for severe pain not relieved by the Tylenol or Ibuprofen.  Monitor your Tylenol intake to a max of 4,000 mg in a 24 hour period. You can alternate these medications after surgery.  Diet: 1. Low sodium Heart Healthy Diet is recommended but you are cleared to resume your normal (before surgery) diet after your procedure.  2. It is safe to use a laxative, such as Miralax or Colace, if you have difficulty moving your bowels. You have been prescribed Sennakot-S to take at bedtime every evening after surgery to keep bowel movements regular and to prevent constipation.    Wound Care: 1. Keep clean and dry.  Shower daily.  Reasons to call the Doctor: Fever - Oral temperature greater than 100.4 degrees  Fahrenheit Foul-smelling vaginal discharge Difficulty urinating Nausea and vomiting Increased pain at the site of the incision that is unrelieved with pain medicine. Difficulty breathing with or without chest pain New calf pain especially if only on one side Sudden, continuing increased vaginal bleeding with or without clots.   Contacts: For questions or concerns you should contact:  Dr. Ailene Ravel  [place] at [phone]  Joylene John, NP at [phone]  After Hours: call [phone] and have the GYN Oncologist paged/contacted (after 5 pm or on the weekends).  Messages sent via mychart are for non-urgent matters and are not responded to after hours so for urgent needs, please call the after hours number.
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
## 2021-12-20 ENCOUNTER — Other Ambulatory Visit: Payer: Self-pay | Admitting: Gynecologic Oncology

## 2021-12-20 DIAGNOSIS — C541 Malignant neoplasm of endometrium: Secondary | ICD-10-CM

## 2021-12-21 LAB — SURGICAL PATHOLOGY

## 2021-12-22 ENCOUNTER — Encounter: Payer: Self-pay | Admitting: Psychiatry

## 2021-12-22 NOTE — Addendum Note (Signed)
Addended by: [name] on: 12/22/2021 07:16 PM   Modules accepted: Level of Service

## 2021-12-23 ENCOUNTER — Ambulatory Visit (HOSPITAL_COMMUNITY)
Admission: RE | Admit: 2021-12-23 | Discharge: 2021-12-23 | Disposition: A | Discharge status: Home / Self Care | Payer: Medicaid Other | Source: Ambulatory Visit | Attending: Psychiatry | Admitting: Psychiatry

## 2021-12-23 DIAGNOSIS — E1169 Type 2 diabetes mellitus with other specified complication: Secondary | ICD-10-CM | POA: Diagnosis present

## 2021-12-23 DIAGNOSIS — C541 Malignant neoplasm of endometrium: Secondary | ICD-10-CM | POA: Diagnosis present

## 2021-12-23 DIAGNOSIS — Z794 Long term (current) use of insulin: Secondary | ICD-10-CM | POA: Insufficient documentation

## 2021-12-23 LAB — POCT I-STAT CREATININE: Creatinine, Ser: 0.8 mg/dL (ref 0.44–1.00)

## 2021-12-23 MED ORDER — IOHEXOL 300 MG/ML  SOLN
100.0000 mL | Freq: Once | INTRAMUSCULAR | Status: AC | PRN
  Administered 2021-12-23: 100 mL via INTRAVENOUS

## 2021-12-26 ENCOUNTER — Encounter: Payer: Self-pay | Admitting: Family Medicine

## 2021-12-26 ENCOUNTER — Ambulatory Visit (INDEPENDENT_AMBULATORY_CARE_PROVIDER_SITE_OTHER): Payer: Medicaid Other | Admitting: Family Medicine

## 2021-12-26 VITALS — BP 132/80 | HR 78 | Temp 98.1°F | Resp 16 | Wt 310.0 lb

## 2021-12-26 DIAGNOSIS — Z6841 Body Mass Index (BMI) 40.0 and over, adult: Secondary | ICD-10-CM

## 2021-12-26 DIAGNOSIS — I10 Essential (primary) hypertension: Secondary | ICD-10-CM

## 2021-12-26 DIAGNOSIS — Z794 Long term (current) use of insulin: Secondary | ICD-10-CM

## 2021-12-26 DIAGNOSIS — E785 Hyperlipidemia, unspecified: Secondary | ICD-10-CM

## 2021-12-26 DIAGNOSIS — E1169 Type 2 diabetes mellitus with other specified complication: Secondary | ICD-10-CM | POA: Diagnosis not present

## 2021-12-26 MED ORDER — LISINOPRIL 10 MG PO TABS: ORAL_TABLET | ORAL | 1 refills | Status: DC

## 2021-12-26 NOTE — Progress Notes (Signed)
Patient here for follow up with Dr. Ernestina Patches and for a pre-operative appointment prior to her scheduled surgery on January 10, 2022. She is scheduled for  robotic assisted total laparoscopic hysterectomy, bilateral salpingo-oophorectomy, sentinel lymph node biopsy, possible dilation and curettage of the uterus with Mirena IUD placement. The surgery was discussed in detail.  See after visit summary for additional details. Visual aids used to discuss items related to surgery.   Discussed post-op pain management in detail including the aspects of the enhanced recovery pathway.  Advised her that a new prescription would be sent in for tramadol and it is only to be used for after her upcoming surgery.  We discussed the use of tylenol post-op and to monitor for a maximum of 4,000 mg in a 24 hour period.  Also prescribed sennakot to be used after surgery and to hold if having loose stools.  Discussed bowel regimen in detail.     Discussed measures to take at home to prevent DVT including frequent mobility.  Reportable signs and symptoms of DVT discussed. Post-operative instructions discussed and expectations for after surgery. Incisional care discussed as well including reportable signs and symptoms including erythema, drainage, wound separation.     10 minutes spent with the patient.  Verbalizing understanding of material discussed. No needs or concerns voiced at the end of the visit.   Advised patient to call for any needs.  Advised that her post-operative medications had been prescribed and could be picked up at any time.    This appointment is included in the global surgical bundle as pre-operative teaching and has no charge.

## 2021-12-26 NOTE — Patient Instructions (Signed)
Preparing for your Surgery   Plan for surgery on January 10, 2022 with Dr. [name] at [facility] will be scheduled for robotic assisted total laparoscopic hysterectomy (removal of the uterus and cervix), bilateral salpingo-oophorectomy (removal of both ovaries and fallopian tubes), sentinel lymph node biopsy, possible dilation and curettage of the uterus with Mirena IUD placement.    Pre-operative Testing -You will receive a phone call from presurgical testing at [facility] to arrange for a pre-operative appointment and lab work.   -Bring your insurance card, copy of an advanced directive if applicable, medication list   -At that visit, you will be asked to sign a consent for a possible blood transfusion in case a transfusion becomes necessary during surgery.  The need for a blood transfusion is rare but having consent is a necessary part of your care.      -You should not be taking blood thinners or aspirin at least ten days prior to surgery unless instructed by your surgeon.   -Do not take supplements such as fish oil (omega 3), red yeast rice, turmeric before your surgery. You want to avoid medications with aspirin in them including headache powders such as BC or Goody's), Excedrin migraine.   Day Before Surgery at [facility] will be asked to take in a light diet the day before surgery. You will be advised you can have clear liquids up until 3 hours before your surgery.     Eat a light diet the day before surgery.  Examples including soups, broths, toast, yogurt, mashed potatoes.  AVOID GAS PRODUCING FOODS. Things to avoid include carbonated beverages (fizzy beverages, sodas), raw fruits and raw vegetables (uncooked), or beans.    If your bowels are filled with gas, your surgeon will have difficulty visualizing your pelvic organs which increases your surgical risks.   Your role in recovery Your role is to become active as soon as directed by your doctor, while  still giving yourself time to heal.  Rest when you feel tired. You will be asked to do the following in order to speed your recovery:   - Cough and breathe deeply. This helps to clear and expand your lungs and can prevent pneumonia after surgery.  - [place]. Do mild physical activity. Walking or moving your legs help your circulation and body functions return to normal. Do not try to get up or walk alone the first time after surgery.   -If you develop swelling on one leg or the other, pain in the back of your leg, redness/warmth in one of your legs, please call the office or go to the Emergency Room to have a doppler to rule out a blood clot. For shortness of breath, chest pain-seek care in the Emergency Room as soon as possible. - Actively manage your pain. Managing your pain lets you move in comfort. We will ask you to rate your pain on a scale of zero to 10. It is your responsibility to tell your doctor or nurse where and how much you hurt so your pain can be treated.   Special Considerations -If you are diabetic, you may be placed on insulin after surgery to have closer control over your blood sugars to promote healing and recovery.  This does not mean that you will be discharged on insulin.  If applicable, your oral antidiabetics will be resumed when you are tolerating a solid diet.   -Your final pathology results from surgery should  be available around one week after surgery and the results will be relayed to you when available.   -FMLA forms can be faxed to [phone] and please allow 5-7 business days for completion.   Pain Management After Surgery -You have been prescribed your pain medication and bowel regimen medications before surgery so that you can have these available when you are discharged from the hospital. The pain medication is for use ONLY AFTER surgery and a new prescription will not be given.    -Make sure that you have Tylenol and Ibuprofen IF YOU ARE  ABLE TO TAKE THESE MEDICATIONS at home to use on a regular basis after surgery for pain control. We recommend alternating the medications every hour to six hours since they work differently and are processed in the body differently for pain relief.   -Review the attached handout on narcotic use and their risks and side effects.    Bowel Regimen -You have been prescribed Sennakot-S to take nightly to prevent constipation especially if you are taking the narcotic pain medication intermittently.  It is important to prevent constipation and drink adequate amounts of liquids. You can stop taking this medication when you are not taking pain medication and you are back on your normal bowel routine.   Risks of Surgery Risks of surgery are low but include bleeding, infection, damage to surrounding structures, re-operation, blood clots, and very rarely death.     Blood Transfusion Information (For the consent to be signed before surgery)   We will be checking your blood type before surgery so in case of emergencies, we will know what type of blood you would need.                                             WHAT IS A BLOOD TRANSFUSION?   A transfusion is the replacement of blood or some of its parts. Blood is made up of multiple cells which provide different functions. Red blood cells carry oxygen and are used for blood loss replacement. White blood cells fight against infection. Platelets control bleeding. Plasma helps clot blood. Other blood products are available for specialized needs, such as hemophilia or other clotting disorders. BEFORE THE TRANSFUSION  Who gives blood for transfusions?  You may be able to donate blood to be used at a later date on yourself (autologous donation). Relatives can be asked to donate blood. This is generally not any safer than if you have received blood from a stranger. The same precautions are taken to ensure safety when a relative's blood is donated. Healthy  volunteers who are fully evaluated to make sure their blood is safe. This is blood bank blood. Transfusion therapy is the safest it has ever been in the practice of medicine. Before blood is taken from a donor, a complete history is taken to make sure that person has no history of diseases nor engages in risky social behavior (examples are intravenous drug use or sexual activity with multiple partners). The donor's travel history is screened to minimize risk of transmitting infections, such as malaria. The donated blood is tested for signs of infectious diseases, such as HIV and hepatitis. The blood is then tested to be sure it is compatible with you in order to minimize the chance of a transfusion reaction. If you or a relative donates blood, this is often done in anticipation of  surgery and is not appropriate for emergency situations. It takes many days to process the donated blood. RISKS AND COMPLICATIONS Although transfusion therapy is very safe and saves many lives, the main dangers of transfusion include:  Getting an infectious disease. Developing a transfusion reaction. This is an allergic reaction to something in the blood you were given. Every precaution is taken to prevent this. The decision to have a blood transfusion has been considered carefully by your caregiver before blood is given. Blood is not given unless the benefits outweigh the risks.   AFTER SURGERY INSTRUCTIONS   Return to work: 4-6 weeks if applicable   Activity: 1. Be up and out of the bed during the day.  Take a nap if needed.  You may walk up steps but be careful and use the hand rail.  Stair climbing will tire you more than you think, you may need to stop part way and rest.    2. No lifting or straining for 6 weeks over 10 pounds. No pushing, pulling, straining for 6 weeks.   3. No driving for around 1 week(s).  Do not drive if you are taking narcotic pain medicine and make sure that your reaction time has returned.     4. You can shower as soon as the next day after surgery. Shower daily.  Use your regular soap and water (not directly on the incision) and pat your incision(s) dry afterwards; don't rub.  No tub baths or submerging your body in water until cleared by your surgeon. If you have the soap that was given to you by pre-surgical testing that was used before surgery, you do not need to use it afterwards because this can irritate your incisions.    5. No sexual activity and nothing in the vagina for 8-10 weeks.   6. You may experience a small amount of clear drainage from your incisions, which is normal.  If the drainage persists, increases, or changes color please call the office.   7. Do not use creams, lotions, or ointments such as neosporin on your incisions after surgery until advised by your surgeon because they can cause removal of the dermabond glue on your incisions.     8. You may experience vaginal spotting after surgery or around the 6-8 week mark from surgery when the stitches at the top of the vagina begin to dissolve.  The spotting is normal but if you experience heavy bleeding, call our office.   9. Take Tylenol or ibuprofen first for pain if you are able to take these medications and only use narcotic pain medication for severe pain not relieved by the Tylenol or Ibuprofen.  Monitor your Tylenol intake to a max of 4,000 mg in a 24 hour period. You can alternate these medications after surgery.   Diet: 1. Low sodium Heart Healthy Diet is recommended but you are cleared to resume your normal (before surgery) diet after your procedure.   2. It is safe to use a laxative, such as Miralax or Colace, if you have difficulty moving your bowels. You have been prescribed Sennakot-S to take at bedtime every evening after surgery to keep bowel movements regular and to prevent constipation.     Wound Care: 1. Keep clean and dry.  Shower daily.   Reasons to call the Doctor: Fever - Oral temperature  greater than 100.4 degrees Fahrenheit Foul-smelling vaginal discharge Difficulty urinating Nausea and vomiting Increased pain at the site of the incision that is unrelieved with pain  medicine. Difficulty breathing with or without chest pain New calf pain especially if only on one side Sudden, continuing increased vaginal bleeding with or without clots.   Contacts: For questions or concerns you should contact:   Dr. [name] at [place], [place] at [phone]   After Hours: call [phone] and have the GYN Oncologist paged/contacted (after 5 pm or on the weekends).   Messages sent via mychart are for non-urgent matters and are not responded to after hours so for urgent needs, please call the after hours number.
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
## 2021-12-27 NOTE — Patient Instructions (Signed)
DUE TO COVID-19 ONLY TWO VISITORS  (aged 58 and older)  ARE ALLOWED TO COME WITH YOU AND STAY IN THE WAITING ROOM ONLY DURING PRE OP AND PROCEDURE.   **NO VISITORS ARE ALLOWED IN THE SHORT STAY AREA OR RECOVERY ROOM!!**  IF YOU WILL BE ADMITTED INTO THE HOSPITAL YOU ARE ALLOWED ONLY FOUR SUPPORT PEOPLE DURING VISITATION HOURS ONLY (7 AM -8PM)   The support person(s) must pass our screening, gel in and out, and wear a mask at all times, including in the patient's room. Patients must also wear a mask when staff or their support person are in the room. Visitors GUEST BADGE MUST BE WORN VISIBLY  One adult visitor may remain with you overnight and MUST be in the room by 8 P.M.     Your procedure is scheduled on: 01/10/22   Report to [facility] Main Entrance    Report to admitting at : 8:45 AM   Call this number if you have problems the morning of surgery [phone]  Eat a light diet the day before surgery.  Examples including soups, broths, toast, yogurt, mashed potatoes.  Things to avoid include carbonated beverages (fizzy beverages), raw fruits and raw vegetables, or beans.   If your bowels are filled with gas, your surgeon will have difficulty visualizing your pelvic organs which increases your surgical risks.   Do not eat food :After Midnight.   After Midnight you may have the following liquids until: 8:00 AM DAY OF SURGERY  Water Black Coffee (sugar ok, NO MILK/CREAM OR CREAMERS)  Tea (sugar ok, NO MILK/CREAM OR CREAMERS) regular and decaf                             Plain Jell-O (NO RED)                                           Fruit ices (not with fruit pulp, NO RED)                                     Popsicles (NO RED)                                                                  Juice: apple, WHITE grape, WHITE cranberry Sports drinks like Gatorade (NO RED)            Oral Hygiene is also important to reduce your risk of infection.                                     Remember - BRUSH YOUR TEETH THE MORNING OF SURGERY WITH YOUR REGULAR TOOTHPASTE  DENTURES WILL BE REMOVED PRIOR TO SURGERY PLEASE DO NOT APPLY "Poly grip" OR ADHESIVES!!!   Do NOT smoke after Midnight   Take these medicines the morning of surgery with A SIP OF WATER:buspirone,gabapentin,bupropion,duloxetine,cetirizine,propanolol,pantoprazole.Inhalers and flonase as usual.Hydroxyzine,clonidine as needed.  How to Manage Your Diabetes Before and After Surgery  Why is it important to control my blood sugar before and after surgery? Improving blood sugar levels before and after surgery helps healing and can limit problems. A way of improving blood sugar control is eating a healthy diet by:  Eating less sugar and carbohydrates  Increasing activity/exercise  Talking with your doctor about reaching your blood sugar goals High blood sugars (greater than 180 mg/dL) can raise your risk of infections and slow your recovery, so you will need to focus on controlling your diabetes during the weeks before surgery. Make sure that the doctor who takes care of your diabetes knows about your planned surgery including the date and location.  How do I manage my blood sugar before surgery? Check your blood sugar at least 4 times a day, starting 2 days before surgery, to make sure that the level is not too high or low. Check your blood sugar the morning of your surgery when you wake up and every 2 hours until you get to the Short Stay unit. If your blood sugar is less than 70 mg/dL, you will need to treat for low blood sugar: Do not take insulin. Treat a low blood sugar (less than 70 mg/dL) with  cup of clear juice (cranberry or apple), 4 glucose tablets, OR glucose gel. Recheck blood sugar in 15 minutes after treatment (to make sure it is greater than 70 mg/dL). If your blood sugar is not greater than 70 mg/dL on recheck, call [phone] for further instructions. Report your blood sugar to the short stay  nurse when you get to Short Stay.  If you are admitted to the hospital after surgery: Your blood sugar will be checked by the staff and you will probably be given insulin after surgery (instead of oral diabetes medicines) to make sure you have good blood sugar levels. The goal for blood sugar control after surgery is 80-180 mg/dL.   WHAT DO I DO ABOUT MY DIABETES MEDICATION?  HOLD farxiga 72 hours ( 3 days) before surgery.  THE NIGHT BEFORE SURGERY, DO NOT take the night dose of novolog insulin. Take ONLY half of the lantus insulin dose at night time.      THE MORNING OF SURGERY, DO NOT TAKE ANY ORAL DIABETIC MEDICATIONS DAY OF YOUR SURGERY  If your CBG is greater than 220 mg/dL, you may take  of your sliding scale  (correction) dose of insulin.    Bring CPAP mask and tubing day of surgery.                              You may not have any metal on your body including hair pins, jewelry, and body piercing             Do not wear make-up, lotions, powders, perfumes/cologne, or deodorant  Do not wear nail polish including gel and S&S, artificial/acrylic nails, or any other type of covering on natural nails including finger and toenails. If you have artificial nails, gel coating, etc. that needs to be removed by a nail salon please have this removed prior to surgery or surgery may need to be canceled/ delayed if the surgeon/ anesthesia feels like they are unable to be safely monitored.   Do not shave  48 hours prior to surgery.   Do not bring valuables to the hospital. [place].  Contacts, glasses, or bridgework may not be worn into surgery.   Bring small overnight bag day of surgery.   DO NOT [place]. PHARMACY WILL DISPENSE MEDICATIONS LISTED ON YOUR MEDICATION LIST TO YOU DURING YOUR ADMISSION [place]!    Patients discharged on the day of surgery will not be allowed to drive home.   Someone NEEDS to stay with you for the first 24 hours after anesthesia.   Special Instructions: Bring a copy of your healthcare power of attorney and living will documents         the day of surgery if you haven't scanned them before.              Please read over the following fact sheets you were given: IF YOU HAVE QUESTIONS ABOUT YOUR PRE-OP INSTRUCTIONS PLEASE CALL [phone]     WHAT IS A BLOOD TRANSFUSION? Blood Transfusion Information  A transfusion is the replacement of blood or some of its parts. Blood is made up of multiple cells which provide different functions. Red blood cells carry oxygen and are used for blood loss replacement. White blood cells fight against infection. Platelets control bleeding. Plasma helps clot blood. Other blood products are available for specialized needs, such as hemophilia or other clotting disorders. BEFORE THE TRANSFUSION  Who gives blood for transfusions?  Healthy volunteers who are fully evaluated to make sure their blood is safe. This is blood bank blood. Transfusion therapy is the safest it has ever been in the practice of medicine. Before blood is taken from a donor, a complete history is taken to make sure that person has no history of diseases nor engages in risky social behavior (examples are intravenous drug use or sexual activity with multiple partners). The donor's travel history is screened to minimize risk of transmitting infections, such as malaria. The donated blood is tested for signs of infectious diseases, such as HIV and hepatitis. The blood is then tested to be sure it is compatible with you in order to minimize the chance of a transfusion reaction. If you or a relative donates blood, this is often done in anticipation of surgery and is not appropriate for emergency situations. It takes many days to process the donated blood. RISKS AND COMPLICATIONS Although transfusion therapy is very safe and saves many lives, the main dangers of  transfusion include:  Getting an infectious disease. Developing a transfusion reaction. This is an allergic reaction to something in the blood you were given. Every precaution is taken to prevent this. The decision to have a blood transfusion has been considered carefully by your caregiver before blood is given. Blood is not given unless the benefits outweigh the risks. AFTER THE TRANSFUSION Right after receiving a blood transfusion, you will usually feel much better and more energetic. This is especially true if your red blood cells have gotten low (anemic). The transfusion raises the level of the red blood cells which carry oxygen, and this usually causes an energy increase. The nurse administering the transfusion will monitor you carefully for complications. HOME CARE INSTRUCTIONS  No special instructions are needed after a transfusion. You may find your energy is better. Speak with your caregiver about any limitations on activity for underlying diseases you may have. SEEK MEDICAL CARE IF:  Your condition is not improving after your transfusion. You develop redness or irritation at the intravenous (IV) site. SEEK IMMEDIATE MEDICAL CARE IF:  Any of the following symptoms occur over the next  12 hours: Shaking chills. You have a temperature by mouth above 102 F (38.9 C), not controlled by medicine. Chest, back, or muscle pain. People around you feel you are not acting correctly or are confused. Shortness of breath or difficulty breathing. Dizziness and fainting. You get a rash or develop hives. You have a decrease in urine output. Your urine turns a dark color or changes to pink, red, or brown. Any of the following symptoms occur over the next 10 days: You have a temperature by mouth above 102 F (38.9 C), not controlled by medicine. Shortness of breath. Weakness after normal activity. The white part of the eye turns yellow (jaundice). You have a decrease in the amount of urine or are  urinating less often. Your urine turns a dark color or changes to pink, red, or brown. Document Released: 12/24/1999 Document Revised: 03/20/2011 Document Reviewed: 08/12/2007 ExitCare Patient Information 2014 [employer] [place].  _______________________________________________________________________ [facility] - Preparing for Surgery Before surgery, you can play an important role.  Because skin is not sterile, your skin needs to be as free of germs as possible.  You can reduce the number of germs on your skin by washing with CHG (chlorahexidine gluconate) soap before surgery.  CHG is an antiseptic cleaner which kills germs and bonds with the skin to continue killing germs even after washing. Please DO NOT use if you have an allergy to CHG or antibacterial soaps.  If your skin becomes reddened/irritated stop using the CHG and inform your nurse when you arrive at Short Stay. Do not shave (including legs and underarms) for at least 48 hours prior to the first CHG shower.  You may shave your face/neck. Please follow these instructions carefully:  1.  Shower with CHG Soap the night before surgery and the  morning of Surgery.  2.  If you choose to wash your hair, wash your hair first as usual with your  normal  shampoo.  3.  After you shampoo, rinse your hair and body thoroughly to remove the  shampoo.                           4.  Use CHG as you would any other liquid soap.  You can apply chg directly  to the skin and wash                       Gently with a scrungie or clean washcloth.  5.  Apply the CHG Soap to your body ONLY FROM THE NECK DOWN.   Do not use on face/ open                           Wound or open sores. Avoid contact with eyes, ears mouth and genitals (private parts).                       Wash face,  Genitals (private parts) with your normal soap.             6.  Wash thoroughly, paying special attention to the area where your surgery  will be performed.  7.  Thoroughly rinse your  body with warm water from the neck down.  8.  DO NOT shower/wash with your normal soap after using and rinsing off  the CHG Soap.  9.  Pat yourself dry with a clean towel.            10.  Wear clean pajamas.            11.  Place clean sheets on your bed the night of your first shower and do not  sleep with pets. Day of Surgery : Do not apply any lotions/deodorants the morning of surgery.  Please wear clean clothes to the hospital/surgery center.  FAILURE TO FOLLOW THESE INSTRUCTIONS MAY RESULT IN THE CANCELLATION OF YOUR SURGERY PATIENT SIGNATURE_________________________________  NURSE SIGNATURE__________________________________  ________________________________________________________________________[facility] - Preparing for Surgery Before surgery, you can play an important role.  Because skin is not sterile, your skin needs to be as free of germs as possible.  You can reduce the number of germs on your skin by washing with CHG (chlorahexidine gluconate) soap before surgery.  CHG is an antiseptic cleaner which kills germs and bonds with the skin to continue killing germs even after washing. Please DO NOT use if you have an allergy to CHG or antibacterial soaps.  If your skin becomes reddened/irritated stop using the CHG and inform your nurse when you arrive at Short Stay. Do not shave (including legs and underarms) for at least 48 hours prior to the first CHG shower.  You may shave your face/neck. Please follow these instructions carefully:  1.  Shower with CHG Soap the night before surgery and the  morning of Surgery.  2.  If you choose to wash your hair, wash your hair first as usual with your  normal  shampoo.  3.  After you shampoo, rinse your hair and body thoroughly to remove the  shampoo.                           4.  Use CHG as you would any other liquid soap.  You can apply chg directly  to the skin and wash                       Gently with a scrungie or clean  washcloth.  5.  Apply the CHG Soap to your body ONLY FROM THE NECK DOWN.   Do not use on face/ open                           Wound or open sores. Avoid contact with eyes, ears mouth and genitals (private parts).                       Wash face,  Genitals (private parts) with your normal soap.             6.  Wash thoroughly, paying special attention to the area where your surgery  will be performed.  7.  Thoroughly rinse your body with warm water from the neck down.  8.  DO NOT shower/wash with your normal soap after using and rinsing off  the CHG Soap.                9.  Pat yourself dry with a clean towel.            10.  Wear clean pajamas.            11.  Place clean sheets on your bed the night of your first shower and do not  sleep with pets. Day of  Surgery : Do not apply any lotions/deodorants the morning of surgery.  Please wear clean clothes to the hospital/surgery center.  FAILURE TO FOLLOW THESE INSTRUCTIONS MAY RESULT IN THE CANCELLATION OF YOUR SURGERY PATIENT SIGNATURE_________________________________  NURSE SIGNATURE__________________________________  ________________________________________________________________________

## 2021-12-28 ENCOUNTER — Other Ambulatory Visit: Payer: Self-pay

## 2021-12-28 ENCOUNTER — Encounter (HOSPITAL_COMMUNITY): Payer: Self-pay

## 2021-12-28 ENCOUNTER — Telehealth: Payer: Self-pay | Admitting: Oncology

## 2021-12-28 ENCOUNTER — Encounter (HOSPITAL_COMMUNITY)
Admission: RE | Admit: 2021-12-28 | Discharge: 2021-12-28 | Disposition: A | Discharge status: Home / Self Care | Payer: Medicaid Other | Source: Ambulatory Visit | Attending: Psychiatry | Admitting: Psychiatry

## 2021-12-28 ENCOUNTER — Encounter: Payer: Self-pay | Admitting: Family Medicine

## 2021-12-28 VITALS — BP 138/77 | HR 75 | Temp 98.5°F | Ht 66.0 in | Wt 306.0 lb

## 2021-12-28 DIAGNOSIS — Z794 Long term (current) use of insulin: Secondary | ICD-10-CM | POA: Diagnosis not present

## 2021-12-28 DIAGNOSIS — I10 Essential (primary) hypertension: Secondary | ICD-10-CM | POA: Insufficient documentation

## 2021-12-28 DIAGNOSIS — C541 Malignant neoplasm of endometrium: Secondary | ICD-10-CM | POA: Insufficient documentation

## 2021-12-28 DIAGNOSIS — Z01818 Encounter for other preprocedural examination: Secondary | ICD-10-CM | POA: Insufficient documentation

## 2021-12-28 DIAGNOSIS — E1169 Type 2 diabetes mellitus with other specified complication: Secondary | ICD-10-CM | POA: Diagnosis not present

## 2021-12-28 LAB — CBC
HCT: 42.8 % (ref 36.0–46.0)
Hemoglobin: 12.9 g/dL (ref 12.0–15.0)
MCH: 26.7 pg (ref 26.0–34.0)
MCHC: 30.1 g/dL (ref 30.0–36.0)
MCV: 88.4 fL (ref 80.0–100.0)
Platelets: 274 10*3/uL (ref 150–400)
RBC: 4.84 MIL/uL (ref 3.87–5.11)
RDW: 13.7 % (ref 11.5–15.5)
WBC: 9.7 10*3/uL (ref 4.0–10.5)
nRBC: 0 % (ref 0.0–0.2)

## 2021-12-28 LAB — COMPREHENSIVE METABOLIC PANEL
ALT: 17 U/L (ref 0–44)
AST: 11 U/L — ABNORMAL LOW (ref 15–41)
Albumin: 3.3 g/dL — ABNORMAL LOW (ref 3.5–5.0)
Alkaline Phosphatase: 148 U/L — ABNORMAL HIGH (ref 38–126)
Anion gap: 7 (ref 5–15)
BUN: 12 mg/dL (ref 6–20)
CO2: 28 mmol/L (ref 22–32)
Calcium: 9.1 mg/dL (ref 8.9–10.3)
Chloride: 105 mmol/L (ref 98–111)
Creatinine, Ser: 0.76 mg/dL (ref 0.44–1.00)
GFR, Estimated: >60 mL/min (ref >60)
Glucose, Bld: 245 mg/dL — ABNORMAL HIGH (ref 70–99)
Potassium: 3.7 mmol/L (ref 3.5–5.1)
Sodium: 140 mmol/L (ref 135–145)
Total Bilirubin: 0.4 mg/dL (ref 0.3–1.2)
Total Protein: 6.9 g/dL (ref 6.5–8.1)

## 2021-12-28 LAB — GLUCOSE, CAPILLARY: Glucose-Capillary: 251 mg/dL — ABNORMAL HIGH (ref 70–99)

## 2021-12-28 LAB — TYPE AND SCREEN
ABO/RH(D): AB POS
Antibody Screen: NEGATIVE

## 2021-12-28 NOTE — Telephone Encounter (Signed)
Called Tara Keller back and let her know her CT scan from earlier this week was negative.  Also advised that she can try taking 1 Tramadol to see if that helps.  Otherwise she can try Tylenol, heat or cold to see if that helps relieve the pain. She verbalized understanding and agreement.

## 2021-12-28 NOTE — Progress Notes (Signed)
For Short Stay: [place] appointment date:  Bowel Prep reminder:   For Anesthesia: PCP - Dr. Dorna Mai. LOV: 12/26/21 Cardiologist -   Chest x-ray - 10/17/21 EKG -  Stress Test -  ECHO -  Cardiac Cath -  Pacemaker/ICD device last checked: Pacemaker orders received: Device Rep notified:  Spinal Cord Stimulator:  Sleep Study - Yes CPAP - NO  Fasting Blood Sugar - 150 - 200 Checks Blood Sugar __4___ times a day Date and result of last Hgb A1c- 8.5: 11/07/21  Last dose of GLP1 agonist-  GLP1 instructions:   Last dose of SGLT-2 inhibitors-  SGLT-2 instructions: To hold farxiga 72 hours before surgery  Blood Thinner Instructions: Aspirin Instructions: Hold 10 days before surgery. Last Dose:  Activity level: Can go up a flight of stairs and activities of daily living without stopping and without chest pain and/or shortness of breath   Able to exercise without chest pain and/or shortness of breath   Unable to go up a flight of stairs without chest pain and/or shortness of breath     Anesthesia review: Hx: DIA,HTN,OSA(NO CPAP)  Patient denies shortness of breath, fever, cough and chest pain at PAT appointment   Patient verbalized understanding of instructions that were given to them at the PAT appointment. Patient was also instructed that they will need to review over the PAT instructions again at home before surgery.

## 2021-12-28 NOTE — Telephone Encounter (Signed)
Tara Keller called and said she has been hurting more in her abdomen and is wondering what to do.  She said it feels swollen and it feels like it is pulling.  She is having regular bowel movements with the last this morning.  She tried Tylenol but it did not help.  She asked about the Tramadol for post op and was advised this is only to take after surgery. Advised that I will check with Joylene John, NP and call her back with any recommendations.

## 2021-12-29 LAB — HEMOGLOBIN A1C
Hgb A1c MFr Bld: 8.7 % — ABNORMAL HIGH (ref 4.8–5.6)
Mean Plasma Glucose: 203 mg/dL

## 2021-12-29 NOTE — Progress Notes (Signed)
Lab. Results: A1C: 8.7

## 2021-12-30 ENCOUNTER — Other Ambulatory Visit: Payer: Self-pay | Admitting: Family Medicine

## 2022-01-08 NOTE — Anesthesia Preprocedure Evaluation (Signed)
Anesthesia Evaluation  Patient identified by MRN, date of birth, ID band Patient awake    Reviewed: Allergy & Precautions, NPO status , Patient's Chart, lab work & pertinent test results  History of Anesthesia Complications Negative for: history of anesthetic complications  Airway Mallampati: II  TM Distance: >3 FB Neck ROM: Full   Comment: Previous grade I view with MAC 3, easy mask Dental  (+) Teeth Intact, Dental Advisory Given   Pulmonary neg shortness of breath, asthma (no recent inhaler use) , sleep apnea (does not use CPAP) , neg COPD, neg recent URI, Patient abstained from smoking., former smoker   Pulmonary exam normal breath sounds clear to auscultation       Cardiovascular hypertension (lisinopril, propranolol), Pt. on medications and Pt. on home beta blockers (-) angina (-) Past MI and (-) Cardiac Stents (-) dysrhythmias  Rhythm:Regular Rate:Normal  HLD   Neuro/Psych  PSYCHIATRIC DISORDERS Anxiety Depression     Neuromuscular disease (diabetic neuropathy, lumbar radiculopathy)    GI/Hepatic hiatal hernia,GERD (Barrett's esophagus)  Medicated,,(+)     substance abuse (no current use)  alcohol useFatty liver gastroparesis   Endo/Other  diabetes (Hgb A1c 8.7), Poorly Controlled, Type 2, Oral Hypoglycemic Agents, Insulin Dependent  Morbid obesity  Renal/GU negative Renal ROS     Musculoskeletal  (+) Arthritis ,    Abdominal  (+) + obese  Peds  Hematology negative hematology ROS (+)   Anesthesia Other Findings Endometrial cancer   Reproductive/Obstetrics                             Anesthesia Physical Anesthesia Plan  ASA: 3  Anesthesia Plan: General   Post-op Pain Management:    Induction: Intravenous  PONV Risk Score and Plan: 3 and Ondansetron, Dexamethasone, Scopolamine patch - Pre-op and Treatment may vary due to age or medical condition  Airway Management Planned:  Oral ETT  Additional Equipment:   Intra-op Plan:   Post-operative Plan: Extubation in OR  Informed Consent: I have reviewed the patients History and Physical, chart, labs and discussed the procedure including the risks, benefits and alternatives for the proposed anesthesia with the patient or authorized representative who has indicated his/her understanding and acceptance.     Dental advisory given  Plan Discussed with: CRNA and Anesthesiologist  Anesthesia Plan Comments: (Risks of general anesthesia discussed including, but not limited to, sore throat, hoarse voice, chipped/damaged teeth, injury to vocal cords, nausea and vomiting, allergic reactions, lung infection, heart attack, stroke, and death. All questions answered. )        Anesthesia Quick Evaluation

## 2022-01-10 ENCOUNTER — Encounter (HOSPITAL_COMMUNITY): Payer: Self-pay | Admitting: Psychiatry

## 2022-01-10 ENCOUNTER — Other Ambulatory Visit: Payer: Self-pay

## 2022-01-10 ENCOUNTER — Ambulatory Visit: Payer: Medicaid Other | Admitting: Podiatry

## 2022-01-10 ENCOUNTER — Ambulatory Visit (HOSPITAL_BASED_OUTPATIENT_CLINIC_OR_DEPARTMENT_OTHER): Payer: Medicaid Other | Admitting: Anesthesiology

## 2022-01-10 ENCOUNTER — Ambulatory Visit (HOSPITAL_COMMUNITY): Payer: Medicaid Other | Admitting: Anesthesiology

## 2022-01-10 ENCOUNTER — Encounter (HOSPITAL_COMMUNITY)
Admission: RE | Disposition: A | Discharge status: Home / Self Care | Payer: Self-pay | Source: Home / Self Care | Attending: Psychiatry

## 2022-01-10 ENCOUNTER — Ambulatory Visit (HOSPITAL_COMMUNITY)
Admission: RE | Admit: 2022-01-10 | Discharge: 2022-01-10 | Disposition: A | Discharge status: Home / Self Care | Payer: Medicaid Other | Attending: Psychiatry | Admitting: Psychiatry

## 2022-01-10 DIAGNOSIS — C541 Malignant neoplasm of endometrium: Secondary | ICD-10-CM

## 2022-01-10 DIAGNOSIS — J45909 Unspecified asthma, uncomplicated: Secondary | ICD-10-CM | POA: Diagnosis not present

## 2022-01-10 DIAGNOSIS — Z803 Family history of malignant neoplasm of breast: Secondary | ICD-10-CM | POA: Insufficient documentation

## 2022-01-10 DIAGNOSIS — K219 Gastro-esophageal reflux disease without esophagitis: Secondary | ICD-10-CM | POA: Insufficient documentation

## 2022-01-10 DIAGNOSIS — Z7984 Long term (current) use of oral hypoglycemic drugs: Secondary | ICD-10-CM

## 2022-01-10 DIAGNOSIS — I10 Essential (primary) hypertension: Secondary | ICD-10-CM | POA: Diagnosis not present

## 2022-01-10 DIAGNOSIS — Z794 Long term (current) use of insulin: Secondary | ICD-10-CM

## 2022-01-10 DIAGNOSIS — Z8042 Family history of malignant neoplasm of prostate: Secondary | ICD-10-CM | POA: Diagnosis not present

## 2022-01-10 DIAGNOSIS — Z8719 Personal history of other diseases of the digestive system: Secondary | ICD-10-CM | POA: Insufficient documentation

## 2022-01-10 DIAGNOSIS — Z833 Family history of diabetes mellitus: Secondary | ICD-10-CM | POA: Diagnosis not present

## 2022-01-10 DIAGNOSIS — D251 Intramural leiomyoma of uterus: Secondary | ICD-10-CM | POA: Diagnosis not present

## 2022-01-10 DIAGNOSIS — E1165 Type 2 diabetes mellitus with hyperglycemia: Secondary | ICD-10-CM | POA: Diagnosis not present

## 2022-01-10 DIAGNOSIS — Z800 Family history of malignant neoplasm of digestive organs: Secondary | ICD-10-CM | POA: Diagnosis not present

## 2022-01-10 DIAGNOSIS — Z87891 Personal history of nicotine dependence: Secondary | ICD-10-CM

## 2022-01-10 DIAGNOSIS — G4730 Sleep apnea, unspecified: Secondary | ICD-10-CM | POA: Diagnosis not present

## 2022-01-10 DIAGNOSIS — Z6841 Body Mass Index (BMI) 40.0 and over, adult: Secondary | ICD-10-CM | POA: Insufficient documentation

## 2022-01-10 DIAGNOSIS — K3184 Gastroparesis: Secondary | ICD-10-CM | POA: Insufficient documentation

## 2022-01-10 DIAGNOSIS — E1143 Type 2 diabetes mellitus with diabetic autonomic (poly)neuropathy: Secondary | ICD-10-CM | POA: Insufficient documentation

## 2022-01-10 DIAGNOSIS — E785 Hyperlipidemia, unspecified: Secondary | ICD-10-CM | POA: Diagnosis not present

## 2022-01-10 HISTORY — PX: SENTINEL NODE BIOPSY: SHX6608

## 2022-01-10 HISTORY — PX: ROBOTIC ASSISTED TOTAL HYSTERECTOMY WITH BILATERAL SALPINGO OOPHERECTOMY: SHX6086

## 2022-01-10 LAB — GLUCOSE, CAPILLARY
Glucose-Capillary: 229 mg/dL — ABNORMAL HIGH (ref 70–99)
Glucose-Capillary: 248 mg/dL — ABNORMAL HIGH (ref 70–99)

## 2022-01-10 LAB — ABO/RH: ABO/RH(D): AB POS

## 2022-01-10 SURGERY — HYSTERECTOMY, TOTAL, ROBOT-ASSISTED, LAPAROSCOPIC, WITH BILATERAL SALPINGO-OOPHORECTOMY
Anesthesia: General

## 2022-01-10 MED ORDER — KETOROLAC TROMETHAMINE 30 MG/ML IJ SOLN
30.0000 mg | Freq: Once | INTRAMUSCULAR | Status: AC | PRN
  Administered 2022-01-10: 30 mg via INTRAVENOUS

## 2022-01-10 MED ORDER — SCOPOLAMINE 1 MG/3DAYS TD PT72
1.0000 | MEDICATED_PATCH | TRANSDERMAL | Status: DC
  Administered 2022-01-10: 1.5 mg via TRANSDERMAL
  Filled 2022-01-10: qty 1

## 2022-01-10 MED ORDER — LIDOCAINE HCL URETHRAL/MUCOSAL 2 % EX GEL
CUTANEOUS | Status: AC
  Filled 2022-01-10: qty 15

## 2022-01-10 MED ORDER — ONDANSETRON HCL 4 MG/2ML IJ SOLN
INTRAMUSCULAR | Status: DC | PRN
  Administered 2022-01-10: 4 mg via INTRAVENOUS

## 2022-01-10 MED ORDER — HEMOSTATIC AGENTS (NO CHARGE) OPTIME
TOPICAL | Status: DC | PRN
  Administered 2022-01-10: 1 via TOPICAL

## 2022-01-10 MED ORDER — FENTANYL CITRATE PF 50 MCG/ML IJ SOSY
25.0000 ug | PREFILLED_SYRINGE | INTRAMUSCULAR | Status: DC | PRN
  Administered 2022-01-10: 50 ug via INTRAVENOUS

## 2022-01-10 MED ORDER — FENTANYL CITRATE (PF) 250 MCG/5ML IJ SOLN
INTRAMUSCULAR | Status: DC | PRN
  Administered 2022-01-10 (×2): 50 ug via INTRAVENOUS
  Administered 2022-01-10: 100 ug via INTRAVENOUS
  Administered 2022-01-10: 50 ug via INTRAVENOUS

## 2022-01-10 MED ORDER — FENTANYL CITRATE PF 50 MCG/ML IJ SOSY
PREFILLED_SYRINGE | INTRAMUSCULAR | Status: AC
  Filled 2022-01-10: qty 2

## 2022-01-10 MED ORDER — FENTANYL CITRATE (PF) 250 MCG/5ML IJ SOLN
INTRAMUSCULAR | Status: AC
  Filled 2022-01-10: qty 5

## 2022-01-10 MED ORDER — SUGAMMADEX SODIUM 500 MG/5ML IV SOLN
INTRAVENOUS | Status: DC | PRN
  Administered 2022-01-10: 350 mg via INTRAVENOUS

## 2022-01-10 MED ORDER — CELECOXIB 200 MG PO CAPS
200.0000 mg | ORAL_CAPSULE | ORAL | Status: AC
  Administered 2022-01-10: 200 mg via ORAL
  Filled 2022-01-10: qty 1

## 2022-01-10 MED ORDER — PROMETHAZINE HCL 25 MG/ML IJ SOLN
6.2500 mg | INTRAMUSCULAR | Status: DC | PRN
  Administered 2022-01-10: 12.5 mg via INTRAVENOUS

## 2022-01-10 MED ORDER — KETOROLAC TROMETHAMINE 30 MG/ML IJ SOLN
INTRAMUSCULAR | Status: AC
  Filled 2022-01-10: qty 1

## 2022-01-10 MED ORDER — MIDAZOLAM HCL 2 MG/2ML IJ SOLN
INTRAMUSCULAR | Status: DC | PRN
  Administered 2022-01-10: 2 mg via INTRAVENOUS

## 2022-01-10 MED ORDER — ONDANSETRON HCL 4 MG/2ML IJ SOLN
INTRAMUSCULAR | Status: AC
  Filled 2022-01-10: qty 2

## 2022-01-10 MED ORDER — HEPARIN SODIUM (PORCINE) 5000 UNIT/ML IJ SOLN
5000.0000 [IU] | INTRAMUSCULAR | Status: AC
  Administered 2022-01-10: 5000 [IU] via SUBCUTANEOUS
  Filled 2022-01-10: qty 1

## 2022-01-10 MED ORDER — DEXAMETHASONE SODIUM PHOSPHATE 10 MG/ML IJ SOLN
INTRAMUSCULAR | Status: AC
  Filled 2022-01-10: qty 1

## 2022-01-10 MED ORDER — PROMETHAZINE HCL 25 MG/ML IJ SOLN
INTRAMUSCULAR | Status: AC
  Filled 2022-01-10: qty 1

## 2022-01-10 MED ORDER — ORAL CARE MOUTH RINSE: 15.0000 mL | Freq: Once | OROMUCOSAL | Status: AC

## 2022-01-10 MED ORDER — STERILE WATER FOR INJECTION IJ SOLN
INTRAMUSCULAR | Status: DC | PRN
  Administered 2022-01-10: 1 mL via INTRAVENOUS

## 2022-01-10 MED ORDER — LIDOCAINE 2% (20 MG/ML) 5 ML SYRINGE
INTRAMUSCULAR | Status: DC | PRN
  Administered 2022-01-10: 1.5 mg/kg/h via INTRAVENOUS
  Administered 2022-01-10: 100 mg via INTRAVENOUS

## 2022-01-10 MED ORDER — ROCURONIUM BROMIDE 10 MG/ML (PF) SYRINGE
PREFILLED_SYRINGE | INTRAVENOUS | Status: AC
  Filled 2022-01-10: qty 10

## 2022-01-10 MED ORDER — ACETAMINOPHEN 500 MG PO TABS
1000.0000 mg | ORAL_TABLET | ORAL | Status: AC
  Administered 2022-01-10: 1000 mg via ORAL
  Filled 2022-01-10: qty 2

## 2022-01-10 MED ORDER — LACTATED RINGERS IV SOLN: INTRAVENOUS | Status: DC

## 2022-01-10 MED ORDER — SUGAMMADEX SODIUM 500 MG/5ML IV SOLN
INTRAVENOUS | Status: AC
  Filled 2022-01-10: qty 5

## 2022-01-10 MED ORDER — OXYCODONE HCL 5 MG/5ML PO SOLN: 5.0000 mg | Freq: Once | ORAL | Status: AC | PRN

## 2022-01-10 MED ORDER — ROCURONIUM BROMIDE 10 MG/ML (PF) SYRINGE
PREFILLED_SYRINGE | INTRAVENOUS | Status: DC | PRN
  Administered 2022-01-10: 20 mg via INTRAVENOUS
  Administered 2022-01-10: 30 mg via INTRAVENOUS
  Administered 2022-01-10: 10 mg via INTRAVENOUS
  Administered 2022-01-10: 50 mg via INTRAVENOUS
  Administered 2022-01-10: 10 mg via INTRAVENOUS

## 2022-01-10 MED ORDER — LEVONORGESTREL 20 MCG/DAY IU IUD
1.0000 | INTRAUTERINE_SYSTEM | INTRAUTERINE | Status: DC
  Filled 2022-01-10: qty 1

## 2022-01-10 MED ORDER — ASPIRIN EC 81 MG PO TBEC: 81.0000 mg | DELAYED_RELEASE_TABLET | Freq: Every day | ORAL | 3 refills | Status: AC

## 2022-01-10 MED ORDER — DEXAMETHASONE SODIUM PHOSPHATE 4 MG/ML IJ SOLN: 4.0000 mg | INTRAMUSCULAR | Status: DC

## 2022-01-10 MED ORDER — BUPIVACAINE HCL 0.25 % IJ SOLN
INTRAMUSCULAR | Status: DC | PRN
  Administered 2022-01-10: 40 mL

## 2022-01-10 MED ORDER — BUPIVACAINE HCL 0.25 % IJ SOLN
INTRAMUSCULAR | Status: AC
  Filled 2022-01-10: qty 1

## 2022-01-10 MED ORDER — DEXAMETHASONE SODIUM PHOSPHATE 10 MG/ML IJ SOLN
INTRAMUSCULAR | Status: DC | PRN
  Administered 2022-01-10: 10 mg via INTRAVENOUS

## 2022-01-10 MED ORDER — STERILE WATER FOR IRRIGATION IR SOLN
Status: DC | PRN
  Administered 2022-01-10: 1000 mL

## 2022-01-10 MED ORDER — CHLORHEXIDINE GLUCONATE 0.12 % MT SOLN
15.0000 mL | Freq: Once | OROMUCOSAL | Status: AC
  Administered 2022-01-10: 15 mL via OROMUCOSAL

## 2022-01-10 MED ORDER — LIDOCAINE HCL (PF) 2 % IJ SOLN
INTRAMUSCULAR | Status: AC
  Filled 2022-01-10: qty 5

## 2022-01-10 MED ORDER — OXYCODONE HCL 5 MG PO TABS
5.0000 mg | ORAL_TABLET | Freq: Once | ORAL | Status: AC | PRN
  Administered 2022-01-10: 5 mg via ORAL

## 2022-01-10 MED ORDER — OXYCODONE HCL 5 MG PO TABS
ORAL_TABLET | ORAL | Status: AC
  Filled 2022-01-10: qty 1

## 2022-01-10 MED ORDER — CEFAZOLIN IN SODIUM CHLORIDE 3-0.9 GM/100ML-% IV SOLN
3.0000 g | INTRAVENOUS | Status: AC
  Administered 2022-01-10: 3 g via INTRAVENOUS
  Filled 2022-01-10: qty 100

## 2022-01-10 MED ORDER — STERILE WATER FOR INJECTION IJ SOLN
INTRAMUSCULAR | Status: DC | PRN
  Administered 2022-01-10: 10 mL

## 2022-01-10 MED ORDER — MIDAZOLAM HCL 2 MG/2ML IJ SOLN
INTRAMUSCULAR | Status: AC
  Filled 2022-01-10: qty 2

## 2022-01-10 MED ORDER — LACTATED RINGERS IV SOLN: INTRAVENOUS | Status: DC | PRN

## 2022-01-10 MED ORDER — STERILE WATER FOR INJECTION IJ SOLN
INTRAMUSCULAR | Status: AC
  Filled 2022-01-10: qty 10

## 2022-01-10 MED ORDER — LIDOCAINE HCL (PF) 2 % IJ SOLN
INTRAMUSCULAR | Status: AC
  Filled 2022-01-10: qty 20

## 2022-01-10 MED ORDER — KETAMINE HCL 10 MG/ML IJ SOLN
INTRAMUSCULAR | Status: DC | PRN
  Administered 2022-01-10: 50 mg via INTRAVENOUS

## 2022-01-10 MED ORDER — SUCCINYLCHOLINE CHLORIDE 200 MG/10ML IV SOSY
PREFILLED_SYRINGE | INTRAVENOUS | Status: AC
  Filled 2022-01-10: qty 10

## 2022-01-10 MED ORDER — PROPOFOL 10 MG/ML IV BOLUS
INTRAVENOUS | Status: AC
  Filled 2022-01-10: qty 20

## 2022-01-10 MED ORDER — PROPOFOL 10 MG/ML IV BOLUS
INTRAVENOUS | Status: DC | PRN
  Administered 2022-01-10: 200 mg via INTRAVENOUS

## 2022-01-10 MED ORDER — LACTATED RINGERS IR SOLN
Status: DC | PRN
  Administered 2022-01-10: 1000 mL

## 2022-01-10 SURGICAL SUPPLY — 80 items
ADH SKN CLS APL DERMABOND .7 (GAUZE/BANDAGES/DRESSINGS) ×1
AGENT HMST KT MTR STRL THRMB (HEMOSTASIS)
APL ESCP 34 STRL LF DISP (HEMOSTASIS)
APPLICATOR SURGIFLO ENDO (HEMOSTASIS) IMPLANT
BAG LAPAROSCOPIC 12 15 PORT 16 (BASKET) IMPLANT
BAG RETRIEVAL 12/15 (BASKET)
BLADE SURG SZ10 CARB STEEL (BLADE) IMPLANT
COVER BACK TABLE 60X90IN (DRAPES) ×1 IMPLANT
COVER TIP SHEARS 8 DVNC (MISCELLANEOUS) ×1 IMPLANT
COVER TIP SHEARS 8MM DA VINCI (MISCELLANEOUS) ×1
DERMABOND ADVANCED .7 DNX12 (GAUZE/BANDAGES/DRESSINGS) ×1 IMPLANT
DRAPE ARM DVNC X/XI (DISPOSABLE) ×4 IMPLANT
DRAPE COLUMN DVNC XI (DISPOSABLE) ×1 IMPLANT
DRAPE DA VINCI XI ARM (DISPOSABLE) ×4
DRAPE DA VINCI XI COLUMN (DISPOSABLE) ×1
DRAPE SHEET LG 3/4 BI-LAMINATE (DRAPES) ×1 IMPLANT
DRAPE SURG IRRIG POUCH 19X23 (DRAPES) ×1 IMPLANT
DRSG OPSITE POSTOP 4X6 (GAUZE/BANDAGES/DRESSINGS) IMPLANT
DRSG OPSITE POSTOP 4X8 (GAUZE/BANDAGES/DRESSINGS) IMPLANT
ELECT PENCIL ROCKER SW 15FT (MISCELLANEOUS) IMPLANT
ELECT REM PT RETURN 15FT ADLT (MISCELLANEOUS) ×1 IMPLANT
GAUZE 4X4 16PLY ~~LOC~~+RFID DBL (SPONGE) ×1 IMPLANT
GLOVE BIO SURGEON STRL SZ 6 (GLOVE) ×4 IMPLANT
GLOVE BIO SURGEON STRL SZ 6.5 (GLOVE) ×1 IMPLANT
GLOVE BIOGEL PI IND STRL 6.5 (GLOVE) ×2 IMPLANT
GOWN STRL REUS W/ TWL LRG LVL3 (GOWN DISPOSABLE) ×4 IMPLANT
GOWN STRL REUS W/TWL LRG LVL3 (GOWN DISPOSABLE) ×4
GRASPER SUT TROCAR 14GX15 (MISCELLANEOUS) IMPLANT
HOLDER FOLEY CATH W/STRAP (MISCELLANEOUS) IMPLANT
IRRIG SUCT STRYKERFLOW 2 WTIP (MISCELLANEOUS) ×1
IRRIGATION SUCT STRKRFLW 2 WTP (MISCELLANEOUS) ×1 IMPLANT
KIT PROCEDURE DA VINCI SI (MISCELLANEOUS) ×1
KIT PROCEDURE DVNC SI (MISCELLANEOUS) IMPLANT
KIT TURNOVER KIT A (KITS) IMPLANT
LIGASURE IMPACT 36 18CM CVD LR (INSTRUMENTS) IMPLANT
MANIPULATOR ADVINCU DEL 3.0 PL (MISCELLANEOUS) IMPLANT
MANIPULATOR ADVINCU DEL 3.5 PL (MISCELLANEOUS) IMPLANT
MANIPULATOR UTERINE 4.5 ZUMI (MISCELLANEOUS) IMPLANT
NDL HYPO 21X1.5 SAFETY (NEEDLE) ×1 IMPLANT
NDL INSUFFLATION 14GA 120MM (NEEDLE) IMPLANT
NDL SPNL 18GX3.5 QUINCKE PK (NEEDLE) IMPLANT
NEEDLE HYPO 21X1.5 SAFETY (NEEDLE) ×1 IMPLANT
NEEDLE INSUFFLATION 14GA 120MM (NEEDLE) ×1 IMPLANT
NEEDLE SPNL 18GX3.5 QUINCKE PK (NEEDLE) ×1 IMPLANT
OBTURATOR OPTICAL STANDARD 8MM (TROCAR) ×1
OBTURATOR OPTICAL STND 8 DVNC (TROCAR) ×1
OBTURATOR OPTICALSTD 8 DVNC (TROCAR) ×1 IMPLANT
PACK ROBOT GYN CUSTOM WL (TRAY / TRAY PROCEDURE) ×1 IMPLANT
PAD ARMBOARD 7.5X6 YLW CONV (MISCELLANEOUS) ×1 IMPLANT
PAD POSITIONING PINK XL (MISCELLANEOUS) ×1 IMPLANT
PORT ACCESS TROCAR AIRSEAL 12 (TROCAR) IMPLANT
SCISSORS LAP 5X35 DISP (ENDOMECHANICALS) IMPLANT
SEAL CANN UNIV 5-8 DVNC XI (MISCELLANEOUS) ×4 IMPLANT
SEAL XI 5MM-8MM UNIVERSAL (MISCELLANEOUS) ×4
SET TRI-LUMEN FLTR TB AIRSEAL (TUBING) ×1 IMPLANT
SOL PREP POV-IOD 4OZ 10% (MISCELLANEOUS) ×2 IMPLANT
SPIKE FLUID TRANSFER (MISCELLANEOUS) ×1 IMPLANT
SPONGE T-LAP 18X18 ~~LOC~~+RFID (SPONGE) IMPLANT
SURGIFLO W/THROMBIN 8M KIT (HEMOSTASIS) IMPLANT
SUT MNCRL AB 4-0 PS2 18 (SUTURE) IMPLANT
SUT PDS AB 1 TP1 96 (SUTURE) IMPLANT
SUT VIC AB 0 CT1 27 (SUTURE)
SUT VIC AB 0 CT1 27XBRD ANTBC (SUTURE) IMPLANT
SUT VIC AB 2-0 CT1 27 (SUTURE)
SUT VIC AB 2-0 CT1 TAPERPNT 27 (SUTURE) IMPLANT
SUT VIC AB 4-0 PS2 18 (SUTURE) ×2 IMPLANT
SUT VLOC 180 0 9IN  GS21 (SUTURE)
SUT VLOC 180 0 9IN GS21 (SUTURE) IMPLANT
SYR 10ML LL (SYRINGE) IMPLANT
SYS BAG RETRIEVAL 10MM (BASKET)
SYS WOUND ALEXIS 18CM MED (MISCELLANEOUS)
SYSTEM BAG RETRIEVAL 10MM (BASKET) IMPLANT
SYSTEM WOUND ALEXIS 18CM MED (MISCELLANEOUS) IMPLANT
TOWEL OR NON WOVEN STRL DISP B (DISPOSABLE) IMPLANT
TRAP SPECIMEN MUCUS 40CC (MISCELLANEOUS) IMPLANT
TRAY FOLEY MTR SLVR 16FR STAT (SET/KITS/TRAYS/PACK) ×1 IMPLANT
TROCAR PORT AIRSEAL 5X120 (TROCAR) IMPLANT
UNDERPAD 30X36 HEAVY ABSORB (UNDERPADS AND DIAPERS) ×2 IMPLANT
WATER STERILE IRR 1000ML POUR (IV SOLUTION) ×1 IMPLANT
YANKAUER SUCT BULB TIP 10FT TU (MISCELLANEOUS) IMPLANT

## 2022-01-10 NOTE — Op Note (Signed)
GYNECOLOGIC ONCOLOGY OPERATIVE NOTE  Date of Service: 01/10/2022  Preoperative Diagnosis: Endometrial cancer, diabetes, morbid obesity (BMI 49)  Postoperative Diagnosis: Same  Procedures: Robotic-assisted total laparoscopic hysterectomy, bilateral salpingo-oophorectomy, bilateral sentinel lymph node evaluation and biopsy (modifier 22 for extended time in positioning, retraction due to morbid obesity)  Surgeon: [name], MD  Assistants: Joylene John, NP  Anesthesia: General  Estimated Blood Loss: 50 mL   Fluids: See anesthesia record  Urine Output: 300 ml, clear yellow  Findings: Upper abdominal survey with fatty appearing liver and anterior liver adhesions on the right, normal diaphragm. Normal appearing small and large bowel. Omentum adherent to the midline anterior abdominal wall around the level of the umbilicus, and caudal to the bladder serosa. Uterus with approximately 3cm pedunculated mass, possible fibroid, from the left posterior fundus. Otherwise normal tubes and ovaries. Bladder adherent to the anterior lower uterine segment. No evidence of peritoneal disease, ascites, or carcinomatosis. Sentinel mapping on right to the external iliac vein; sentinel mapping on left to the external iliac. Two lymph nodes removed as, due to adiposity, it was difficult to discern if second lymph node was upper echelon or from a bifurcating channel.    Specimens:  ID Type Source Tests Collected by Time Destination  1 : Right External Iliac Vein Sentinel Lymph Node Tissue PATH Sentinel Lymph Node SURGICAL PATHOLOGY [name], MD 01/10/2022 1233   2 : Left External Iliac Vein Sentinel Lymph Node #1 Tissue PATH Sentinel Lymph Node SURGICAL PATHOLOGY [name], MD 01/10/2022 1255   3 : Left External Iliac Vein Sentinel Lymph Node #2 Tissue PATH Sentinel Lymph Node SURGICAL PATHOLOGY [name], MD 01/10/2022 1310   4 : Uterus, Cervix, Bilateral Fallopian Tubes and Ovaries Tissue  PATH Gyn tumor resection SURGICAL PATHOLOGY [name], MD 03/11/6710 4580     Complications:  None  Indications for Procedure: Tara Keller is a 57 y.o. woman with endometrial cancer that was persistent despite treatment with IUD. Now with improved glucose control, and so decision made to proceed with definitive management with hysterectomy.  Prior to the procedure, all risks, benefits, and alternatives were discussed and informed surgical consent was signed.  Procedure: Patient was taken to the operating room where general anesthesia was achieved.  She was positioned in dorsal lithotomy and prepped and draped. Additional care was taken to support her arms in sleds and her abdominal panus was taped to her thighs.  A foley catheter was inserted into the bladder. 1 ml of dilute Indo-Cyanine dye was was injected at 1cm and 64m deep at 3 and 9 o'clock in the cervical stroma.  The cervix was dilated and an Advincula uterine manipulator with a colpotomy ring was inserted into the uterus.  A 10 mm incision was made in the left upper quadrant near Palmer's point.  The abdomen was entered with a 5 mm OptiView trocar under direct visualization.  The abdomen was insufflated, the patient placed in steep Trendelenburg, and additional trocars were placed as follows: an 887mtrocar superior to the umbilicus, two 8 mm robotic trocars in the right abdomen, and one 8 mm robotic trocar in the left abdomen.  The left upper quadrant trocar was removed and replaced with a 12 mm airseal trocar.  All trocars were placed under direct visualization.  The bowels were moved into the upper abdomen.  The DaVinci robotic surgical system was brought to the patient's bedside and docked.  The right round ligament was transected and the retroperitoneum entered.The right ureter  was identified. The paravesical and pararectal spaces were opened, and the node was found to be located at the right external iliac vein. A sentinel lymph node  dissection was performed taking care to avoid injury to the ureter, superior vesicle artery or obturator nerve. A similar procedure was performed on left with the sentinel lymph node identified at the left external iliac vein. An additional external iliac vein lymph node was noted. Given adiposity, difficult to discern if an upper echelon node versus bifurcating channel, thus decision made to remove both lymph nodes. The sentinel lymph nodes mentioned above were identified and removed through the assistant trocar.  The right ureter was again identified, and the right infundibulopelvic ligament was isolated, cauterized, and transected. The posterior peritoneum was opened to the colpotomy ring. The anterior peritoneum was opened and the bladder flap was created. The right uterine artery was skeletonized, cauterized, and transected at the level of the colpotomy ring. Additional cautery was used in a C-shaped fashion to allow the remainder of the broad, cardinal, and uterosacral ligaments with the uterine vessels to be transected and fall away from the colpotomy ring.  A similar procedure was performed on the contralateral side. A colpotomy was made circumferentially following the contours of the colpotomy ring.  The uterine specimen was removed through the vagina.    The vaginal cuff was closed with a running stitch of 0 Vicryl suture.  The pelvis was irrigated. Surgiflo was placed in the left lymph node dissection bed, and all operative sites were found to be hemostatic.  All instruments were removed and the robot was taken from the patient's bedside.  The fascia at the 12 mm incision was closed with 0 Vicryl using a PMI device. The abdomen was desufflated and all ports were removed. The skin at all incisions was closed with 4-0 Vicryl to reapproximate the subcutaneous tissue and 4-0 monocryl in a subcuticular fashion followed by surgical glue.  Patient tolerated the procedure well. Sponge, lap, and instrument  counts were correct.  Patient received 3 gm of Ancef prior to skin incision for routine perioperative antibiotic prophylaxis.  She was extubated and taken to the PACU in stable condition.  [name], MD Gynecologic Oncology

## 2022-01-10 NOTE — Anesthesia Postprocedure Evaluation (Signed)
Anesthesia Post Note  Patient: Mikalyn Hermida Newton  Procedure(s) Performed: XI ROBOTIC ASSISTED TOTAL HYSTERECTOMY WITH BILATERAL SALPINGO OOPHORECTOMY SENTINEL NODE BIOPSY     Patient location during evaluation: PACU Anesthesia Type: General Level of consciousness: awake Pain management: pain level controlled Vital Signs Assessment: post-procedure vital signs reviewed and stable Respiratory status: spontaneous breathing, nonlabored ventilation and respiratory function stable Cardiovascular status: blood pressure returned to baseline and stable Postop Assessment: no apparent nausea or vomiting Anesthetic complications: no   No notable events documented.  Last Vitals:  Vitals:   01/10/22 1515 01/10/22 1530  BP: (!) 150/78 (!) 151/71  Pulse: 88 80  Resp: 19 19  Temp:    SpO2: 93% 95%    Last Pain:  Vitals:   01/10/22 1530  PainSc: Asleep                 Nilda Simmer

## 2022-01-10 NOTE — Discharge Instructions (Addendum)
AFTER SURGERY INSTRUCTIONS   Return to work: 4-6 weeks if applicable  DO NOT TAKE ALEVE (NAPROXEN) AND MOBIC (MELOXICAM) TOGETHER. USE ONE OR THE OTHER SINCE THEY WORK SIMILARLY.  YOU CAN RESTART YOUR BABY ASPIRIN TOMORROW.   Activity: 1. Be up and out of the bed during the day.  Take a nap if needed.  You may walk up steps but be careful and use the hand rail.  Stair climbing will tire you more than you think, you may need to stop part way and rest.    2. No lifting or straining for 6 weeks over 10 pounds. No pushing, pulling, straining for 6 weeks.   3. No driving for around 1 week(s).  Do not drive if you are taking narcotic pain medicine and make sure that your reaction time has returned.    4. You can shower as soon as the next day after surgery. Shower daily.  Use your regular soap and water (not directly on the incision) and pat your incision(s) dry afterwards; don't rub.  No tub baths or submerging your body in water until cleared by your surgeon. If you have the soap that was given to you by pre-surgical testing that was used before surgery, you do not need to use it afterwards because this can irritate your incisions.    5. No sexual activity and nothing in the vagina for 12 weeks.   6. You may experience a small amount of clear drainage from your incisions, which is normal.  If the drainage persists, increases, or changes color please call the office.   7. Do not use creams, lotions, or ointments such as neosporin on your incisions after surgery until advised by your surgeon because they can cause removal of the dermabond glue on your incisions.     8. You may experience vaginal spotting after surgery or around the 6-8 week mark from surgery when the stitches at the top of the vagina begin to dissolve.  The spotting is normal but if you experience heavy bleeding, call our office.   9. Take Tylenol or ibuprofen (OR MOBIC OR NAPROXEN (ALEVE)- DO NOT TAKE TOGETHER SINCE THEY WORK)  first for pain if you are able to take these medications and only use narcotic pain medication for severe pain not relieved by the Tylenol or Ibuprofen.  Monitor your Tylenol intake to a max of 4,000 mg in a 24 hour period. You can alternate these medications after surgery.   Diet: 1. Low sodium Heart Healthy Diet is recommended but you are cleared to resume your normal (before surgery) diet after your procedure.   2. It is safe to use a laxative, such as Miralax or Colace, if you have difficulty moving your bowels. You have been prescribed Sennakot-S to take at bedtime every evening after surgery to keep bowel movements regular and to prevent constipation.     Wound Care: 1. Keep clean and dry.  Shower daily.   Reasons to call the Doctor: Fever - Oral temperature greater than 100.4 degrees Fahrenheit Foul-smelling vaginal discharge Difficulty urinating Nausea and vomiting Increased pain at the site of the incision that is unrelieved with pain medicine. Difficulty breathing with or without chest pain New calf pain especially if only on one side Sudden, continuing increased vaginal bleeding with or without clots.   Contacts: For questions or concerns you should contact:   Dr. [name] at [facility], NP at [phone]   After Hours: call [phone] and  have the GYN Oncologist paged/contacted (after 5 pm or on the weekends).   Messages sent via mychart are for non-urgent matters and are not responded to after hours so for urgent needs, please call the after hours number.

## 2022-01-10 NOTE — Anesthesia Procedure Notes (Signed)
Procedure Name: Intubation Date/Time: 01/10/2022 10:56 AM  Performed by: Sharlette Dense, CRNAPre-anesthesia Checklist: Patient identified, Emergency Drugs available, Suction available and Patient being monitored Patient Re-evaluated:Patient Re-evaluated prior to induction Oxygen Delivery Method: Circle system utilized Preoxygenation: Pre-oxygenation with 100% oxygen Induction Type: IV induction Ventilation: Mask ventilation without difficulty Laryngoscope Size: Miller and 2 Grade View: Grade I Tube type: Oral Tube size: 7.5 mm Number of attempts: 1 Airway Equipment and Method: Stylet and Oral airway Placement Confirmation: ETT inserted through vocal cords under direct vision, positive ETCO2 and breath sounds checked- equal and bilateral Secured at: 22 cm Tube secured with: Tape Dental Injury: Teeth and Oropharynx as per pre-operative assessment

## 2022-01-10 NOTE — Transfer of Care (Signed)
Immediate Anesthesia Transfer of Care Note  Patient: Tara Keller  Procedure(s) Performed: XI ROBOTIC ASSISTED TOTAL HYSTERECTOMY WITH BILATERAL SALPINGO OOPHORECTOMY SENTINEL NODE BIOPSY  Patient Location: PACU  Anesthesia Type:General  Level of Consciousness: drowsy  Airway & Oxygen Therapy: Patient Spontanous Breathing and Patient connected to face mask oxygen  Post-op Assessment: Report given to RN and Post -op Vital signs reviewed and stable  Post vital signs: Reviewed and stable  Last Vitals:  Vitals Value Taken Time  BP 165/82 01/10/22 1440  Temp    Pulse 90 01/10/22 1442  Resp 15 01/10/22 1442  SpO2 100 % 01/10/22 1442  Vitals shown include unvalidated device data.  Last Pain:  Vitals:   01/10/22 0929  PainSc: 0-No pain         Complications: No notable events documented.

## 2022-01-10 NOTE — Interval H&P Note (Signed)
History and Physical Interval Note:  01/10/2022 10:04 AM  Tara Keller  has presented today for surgery, with the diagnosis of endometrial cancer.  The various methods of treatment have been discussed with the patient and family. After consideration of risks, benefits and other options for treatment, the patient has consented to  Procedure(s): XI ROBOTIC ASSISTED TOTAL HYSTERECTOMY WITH BILATERAL SALPINGO OOPHORECTOMY (N/A) SENTINEL NODE BIOPSY (N/A) POSSIBLE DILATATION AND CURETTAGE, possible mirena iud placement (N/A) as a surgical intervention.  The patient's history has been reviewed, patient examined, no change in status, stable for surgery.  I have reviewed the patient's chart and labs.  Questions were answered to the patient's satisfaction.     [name]

## 2022-01-11 ENCOUNTER — Encounter (HOSPITAL_COMMUNITY): Payer: Self-pay | Admitting: Psychiatry

## 2022-01-11 ENCOUNTER — Other Ambulatory Visit: Payer: Self-pay | Admitting: Gynecologic Oncology

## 2022-01-11 ENCOUNTER — Telehealth: Payer: Self-pay | Admitting: Surgery

## 2022-01-11 DIAGNOSIS — C541 Malignant neoplasm of endometrium: Secondary | ICD-10-CM

## 2022-01-11 MED ORDER — TRAMADOL HCL 50 MG PO TABS: 50.0000 mg | ORAL_TABLET | Freq: Four times a day (QID) | ORAL | 0 refills | Status: DC | PRN

## 2022-01-11 NOTE — Telephone Encounter (Signed)
Spoke with Tara Keller this morning. She states she is eating, drinking and urinating well. She has not had a BM yet but is passing gas. She is taking senokot as prescribed and encouraged her to drink plenty of water. She denies fever or chills. Incisions are dry and intact. She rates her pain 10/10. Her pain is controlled with Tramadol. Patient states she was told that if she was in pain before surgery it was ok to take some of her Tramadol. She has 4 pills left and requests a refill. Advised patient to take Ibuprofen alternating with Tramadol for better pain management. Patient advised that our office will reach out to her regarding Tramadol refill.     Instructed to call office with any fever, chills, purulent drainage, uncontrolled pain or any other questions or concerns. Patient verbalizes understanding.   Pt aware of post op appointments as well as the office number [phone] and after hours number [phone] to call if she has any questions or concerns

## 2022-01-11 NOTE — Progress Notes (Signed)
See RN note. Refill sent in for tramadol

## 2022-01-11 NOTE — Telephone Encounter (Signed)
Called patient back to let her know that refill of Tramadol was sent to her pharmacy. Previous education reinforced. Patient verbalized understanding and had no other concerns at this time.

## 2022-01-12 ENCOUNTER — Other Ambulatory Visit: Payer: Self-pay

## 2022-01-18 ENCOUNTER — Telehealth: Payer: Self-pay | Admitting: Family Medicine

## 2022-01-18 NOTE — Telephone Encounter (Signed)
Called patient stating I am returning her phone call. Patient confirmed someone already assisted her earlier. Patient had no other questions.

## 2022-01-18 NOTE — Telephone Encounter (Signed)
Patient wants to know why she needs her appointment tomorrow.

## 2022-01-19 ENCOUNTER — Ambulatory Visit: Payer: Medicaid Other | Admitting: Obstetrics and Gynecology

## 2022-01-23 ENCOUNTER — Inpatient Hospital Stay: Payer: Medicaid Other | Attending: Psychiatry | Admitting: Psychiatry

## 2022-01-23 VITALS — BP 118/68 | HR 88 | Temp 98.2°F | Resp 16 | Wt 306.8 lb

## 2022-01-23 DIAGNOSIS — C541 Malignant neoplasm of endometrium: Secondary | ICD-10-CM | POA: Diagnosis present

## 2022-01-23 DIAGNOSIS — Z90710 Acquired absence of both cervix and uterus: Secondary | ICD-10-CM | POA: Insufficient documentation

## 2022-01-23 DIAGNOSIS — Z7189 Other specified counseling: Secondary | ICD-10-CM

## 2022-01-23 DIAGNOSIS — Z90722 Acquired absence of ovaries, bilateral: Secondary | ICD-10-CM | POA: Insufficient documentation
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
## 2022-01-23 NOTE — Patient Instructions (Signed)
It was a pleasure to see you in clinic today. - No lifting >10lbs for 6 weeks after surgery. Walking is good. - Nothing in the vagina for 12 weeks. - Return visit planned for 6 months.  Thank you very much for allowing me to provide care for you today.  I appreciate your confidence in choosing our Gynecologic Oncology team at [facility]  If you have any questions about your visit today please call our office or send [name] a MyChart message and we will get back to you as soon as possible.

## 2022-01-24 ENCOUNTER — Other Ambulatory Visit (HOSPITAL_COMMUNITY): Payer: Self-pay

## 2022-01-24 LAB — SURGICAL PATHOLOGY

## 2022-01-29 ENCOUNTER — Encounter: Payer: Self-pay | Admitting: Psychiatry

## 2022-02-01 ENCOUNTER — Encounter (HOSPITAL_COMMUNITY): Payer: Self-pay

## 2022-02-02 ENCOUNTER — Other Ambulatory Visit: Payer: Self-pay | Admitting: Family Medicine

## 2022-02-06 ENCOUNTER — Other Ambulatory Visit (HOSPITAL_COMMUNITY): Payer: Self-pay

## 2022-02-24 ENCOUNTER — Telehealth: Payer: Self-pay | Admitting: Internal Medicine

## 2022-02-24 MED ORDER — DEXCOM G6 TRANSMITTER MISC: 1.0000 | 3 refills | Status: DC

## 2022-02-24 MED ORDER — DEXCOM G6 SENSOR MISC: 1.0000 | 3 refills | Status: DC

## 2022-02-24 NOTE — Telephone Encounter (Signed)
done 

## 2022-02-24 NOTE — Telephone Encounter (Signed)
MEDICATION: Dexcom G6 Sensor Continuous Blood Gluc Sensor (DEXCOM G6 SENSOR) MISC  PHARMACY:    [facility] [phone] - [address], [place] - [address] [place] AT [address] (Ph: [phone])    HAS THE PATIENT CONTACTED THEIR PHARMACY?  Yes  IS THIS A 90 DAY SUPPLY : Yes  IS PATIENT OUT OF MEDICATION: Yes  IF NOT; HOW MUCH IS LEFT:   LAST APPOINTMENT DATE: @10$ /30/2023  NEXT APPOINTMENT DATE:@5$ /06/2022  DO WE HAVE YOUR PERMISSION TO LEAVE A DETAILED MESSAGE?:  OTHER COMMENTS: Patient states that the pharmacy told her that a prior authorization needed to be completed for her to get the [place].   **Let patient know to contact pharmacy at the end of the day to make sure medication is ready. **  ** Please notify patient to allow 48-72 hours to process**  **Encourage patient to contact the pharmacy for refills or they can request refills through [facility]**

## 2022-02-27 ENCOUNTER — Other Ambulatory Visit (HOSPITAL_COMMUNITY): Payer: Self-pay

## 2022-02-27 ENCOUNTER — Telehealth: Payer: Self-pay | Admitting: Pharmacy Technician

## 2022-02-27 NOTE — Telephone Encounter (Signed)
F/u   Patient checking on the status of prior authorization for Dexcom

## 2022-02-27 NOTE — Telephone Encounter (Signed)
Pharmacy Patient Advocate Encounter   Received notification from Staff Msgs/RMA that prior authorization for Dexcom is required/requested.   PA submitted on 02/27/22 to (ins) Medicaid via CoverMyMeds Confirmation # [id number] W (sensors) [id number] W (transmitter) Status is pending   PA ? Does ask if pt has been sen in the last 3 months, so renewal PA could be denied, since she hasn't been seen since October.

## 2022-03-01 NOTE — Telephone Encounter (Signed)
F/u   The patient last office visit was  11/07/2021  per Dr, Kelton Pillar notes  F/U in 6 months.   The patient was seen in the office on  05/06/21 & 01/26/21  The patient has upcoming appt on  05/15/22   The patient is calling checking on the status & call back to discuss

## 2022-03-03 ENCOUNTER — Telehealth: Payer: Self-pay | Admitting: Family Medicine

## 2022-03-03 NOTE — Telephone Encounter (Signed)
Patient is calling to check on the prior authorization for her Dexcom G6 sensor.  Patient would like a return ccall as soon as possible.

## 2022-03-03 NOTE — Telephone Encounter (Signed)
Pt needs refill for Continuous Blood Gluc Sensor (DEXCOM G6 SENSOR) MISC / but this needs a PA/ pt called to see if Dr. Redmond Pulling will fill this / the last one came from [facility]/ please advise

## 2022-03-03 NOTE — Telephone Encounter (Signed)
Patient is aware that I have not received a PA for [facility] Sensor . But I told her I would do  it when it comes.

## 2022-03-06 ENCOUNTER — Ambulatory Visit (INDEPENDENT_AMBULATORY_CARE_PROVIDER_SITE_OTHER): Payer: Medicaid Other | Admitting: Podiatry

## 2022-03-06 ENCOUNTER — Encounter: Payer: Self-pay | Admitting: Podiatry

## 2022-03-06 VITALS — BP 124/60 | HR 74

## 2022-03-06 DIAGNOSIS — B351 Tinea unguium: Secondary | ICD-10-CM | POA: Diagnosis not present

## 2022-03-06 DIAGNOSIS — M79675 Pain in left toe(s): Secondary | ICD-10-CM

## 2022-03-06 DIAGNOSIS — Z794 Long term (current) use of insulin: Secondary | ICD-10-CM

## 2022-03-06 DIAGNOSIS — M79674 Pain in right toe(s): Secondary | ICD-10-CM

## 2022-03-06 DIAGNOSIS — E1142 Type 2 diabetes mellitus with diabetic polyneuropathy: Secondary | ICD-10-CM

## 2022-03-06 NOTE — Progress Notes (Signed)
Complaint:  Visit Type: Patient returns to my office for continued preventative foot care services. Complaint: Patient states" my nails have grown long and thick and become painful to walk and wear shoes" Patient has been diagnosed with DM with neuropathy. The patient presents for preventative foot care services.  Podiatric Exam: Vascular: dorsalis pedis and posterior tibial pulses are palpable bilateral. Capillary return is immediate. Temperature gradient is WNL. Skin turgor WNL  Sensorium: Diminished  Semmes Weinstein monofilament test. Normal tactile sensation bilaterally. Nail Exam: Pt has thick disfigured discolored nails with subungual debris noted bilateral entire nail hallux through fifth toenails Ulcer Exam: There is no evidence of ulcer or pre-ulcerative changes or infection. Orthopedic Exam: Muscle tone and strength are WNL. No limitations in general ROM. No crepitus or effusions noted. Foot type and digits show no abnormalities. HAV  B/L. Skin: No Porokeratosis. No infection or ulcers  Diagnosis:  Onychomycosis, , Pain in right toe, pain in left toes  Treatment & Plan Procedures and Treatment: Consent by patient was obtained for treatment procedures.   Debridement of mycotic and hypertrophic toenails, 1 through 5 bilateral and clearing of subungual debris. No ulceration, no infection noted.  Return Visit-Office Procedure: Patient instructed to return to the office for a follow up visit 3 months for continued evaluation and treatment.    Gardiner Barefoot DPM

## 2022-03-07 ENCOUNTER — Other Ambulatory Visit (HOSPITAL_COMMUNITY): Payer: Self-pay

## 2022-03-07 NOTE — Telephone Encounter (Signed)
Patient Advocate Encounter  Received a fax from [facility] regarding Prior Authorization for Dexcom G6 Sensor.   Authorization has been DENIED. Determination letter to be sent to patient.  Confirmation #: [id number]

## 2022-03-08 ENCOUNTER — Encounter: Payer: Self-pay | Admitting: Internal Medicine

## 2022-03-08 ENCOUNTER — Ambulatory Visit (INDEPENDENT_AMBULATORY_CARE_PROVIDER_SITE_OTHER): Payer: Medicaid Other | Admitting: Internal Medicine

## 2022-03-08 ENCOUNTER — Other Ambulatory Visit: Payer: Self-pay | Admitting: Family Medicine

## 2022-03-08 VITALS — BP 124/80 | HR 74 | Ht 66.0 in | Wt 304.0 lb

## 2022-03-08 DIAGNOSIS — E1165 Type 2 diabetes mellitus with hyperglycemia: Secondary | ICD-10-CM

## 2022-03-08 DIAGNOSIS — Z794 Long term (current) use of insulin: Secondary | ICD-10-CM

## 2022-03-08 DIAGNOSIS — E1142 Type 2 diabetes mellitus with diabetic polyneuropathy: Secondary | ICD-10-CM | POA: Diagnosis not present

## 2022-03-08 DIAGNOSIS — E785 Hyperlipidemia, unspecified: Secondary | ICD-10-CM

## 2022-03-08 DIAGNOSIS — E1169 Type 2 diabetes mellitus with other specified complication: Secondary | ICD-10-CM | POA: Diagnosis not present

## 2022-03-08 LAB — POCT GLYCOSYLATED HEMOGLOBIN (HGB A1C): Hemoglobin A1C: 9.2 % — AB (ref 4.0–5.6)

## 2022-03-08 MED ORDER — INSULIN PEN NEEDLE 31G X 5 MM MISC: 1.0000 | Freq: Four times a day (QID) | 3 refills | Status: DC

## 2022-03-08 MED ORDER — ATORVASTATIN CALCIUM 40 MG PO TABS: 40.0000 mg | ORAL_TABLET | Freq: Every day | ORAL | 3 refills | Status: DC

## 2022-03-08 MED ORDER — METFORMIN HCL ER 750 MG PO TB24: 750.0000 mg | ORAL_TABLET | Freq: Two times a day (BID) | ORAL | 3 refills | Status: DC

## 2022-03-08 MED ORDER — LANTUS SOLOSTAR 100 UNIT/ML ~~LOC~~ SOPN: 70.0000 [IU] | PEN_INJECTOR | Freq: Every day | SUBCUTANEOUS | 4 refills | Status: DC

## 2022-03-08 MED ORDER — DEXCOM G7 SENSOR MISC: 1.0000 | 3 refills | Status: DC

## 2022-03-08 MED ORDER — DAPAGLIFLOZIN PROPANEDIOL 10 MG PO TABS: 10.0000 mg | ORAL_TABLET | Freq: Every day | ORAL | 3 refills | Status: DC

## 2022-03-08 MED ORDER — NOVOLOG FLEXPEN 100 UNIT/ML ~~LOC~~ SOPN: PEN_INJECTOR | SUBCUTANEOUS | 3 refills | Status: DC

## 2022-03-08 NOTE — Patient Instructions (Signed)
-   Continue Metformin 750 mg to 1 tablet TWICE a day  - Increase Lantus to 70 units ONCE a day  - Continue  Novolog 14 units with each meal  - Continue  farxiga 10 mg , 1 tablet daily    - Novolog correctional insulin: ADD extra units on insulin to your meal-time Novolog dose if your blood sugars are higher than 160. Use the scale below to help guide you:   Blood sugar before meal Number of units to inject  Less than 160 0 unit  161 - 190 1 units  191 - 220 2 units  221 - 250 3 units  251 - 280 4 units  281 - 310 5 units  311 - 340 6 units  341 - 370 7 units  371 - 400 8 units      HOW TO TREAT LOW BLOOD SUGARS (Blood sugar LESS THAN 70 MG/DL) Please follow the RULE OF 15 for the treatment of hypoglycemia treatment (when your (blood sugars are less than 70 mg/dL)   STEP 1: Take 15 grams of carbohydrates when your blood sugar is low, which includes:  3-4 GLUCOSE TABS  OR 3-4 OZ OF JUICE OR REGULAR SODA OR ONE TUBE OF GLUCOSE GEL    STEP 2: RECHECK blood sugar in 15 MINUTES STEP 3: If your blood sugar is still low at the 15 minute recheck --> then, go back to STEP 1 and treat AGAIN with another 15 grams of carbohydrates.
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
## 2022-03-13 ENCOUNTER — Telehealth: Payer: Self-pay | Admitting: Internal Medicine

## 2022-03-13 NOTE — Telephone Encounter (Signed)
MEDICATION:  Dexcom G7 Sensor Continuous Blood Gluc Sensor (DEXCOM G7 SENSOR) MISC  PHARMACY:    [facility] [id number] - [place], [place] - [address] (Ph: [phone])    HAS THE PATIENT CONTACTED THEIR PHARMACY?  Yes  IS THIS A 90 DAY SUPPLY : Yes  IS PATIENT OUT OF MEDICATION: Never had them  IF NOT; HOW MUCH IS LEFT:   LAST APPOINTMENT DATE: '@2'$ /28/2024  NEXT APPOINTMENT DATE:'@09'$ /06/2022  DO WE HAVE YOUR PERMISSION TO LEAVE A DETAILED MESSAGE?: Yes  OTHER COMMENTS:  Patient is calling to check on the status of the prior authorization.  Patient states that she has never received the [place].   **Let patient know to contact pharmacy at the end of the day to make sure medication is ready. **  ** Please notify patient to allow 48-72 hours to process**  **Encourage patient to contact the pharmacy for refills or they can request refills through [facility]**

## 2022-03-15 ENCOUNTER — Other Ambulatory Visit (HOSPITAL_COMMUNITY): Payer: Self-pay

## 2022-03-15 ENCOUNTER — Encounter: Payer: Self-pay | Admitting: Emergency Medicine

## 2022-03-15 ENCOUNTER — Ambulatory Visit
Admission: EM | Admit: 2022-03-15 | Discharge: 2022-03-15 | Disposition: A | Discharge status: Home / Self Care | Payer: Medicaid Other | Attending: Family Medicine | Admitting: Family Medicine

## 2022-03-15 DIAGNOSIS — U071 COVID-19: Secondary | ICD-10-CM

## 2022-03-15 DIAGNOSIS — Z1152 Encounter for screening for COVID-19: Secondary | ICD-10-CM

## 2022-03-15 MED ORDER — MOLNUPIRAVIR EUA 200MG CAPSULE: 4.0000 | ORAL_CAPSULE | Freq: Two times a day (BID) | ORAL | 0 refills | Status: AC

## 2022-03-15 NOTE — Discharge Instructions (Signed)
You were seen today for home covid positive test.  I have sent out a medication to take twice/day x 5 days to treat this.  Please start this as soon as possible.  Your covid swab today will be resulted by tomorrow and you will be notified via my chart.  You may take tylenol/motrin for pain/body aches.  Please self isolate until improving, and wear a mask if going out x 5 days.

## 2022-03-15 NOTE — Telephone Encounter (Signed)
PA has been submitted and APPROVED from 03/15/2022-09/11/2022

## 2022-03-15 NOTE — ED Triage Notes (Signed)
Reports taking a covid test at home last night and it reported positive, but states the test was out of date. Says the only symptoms she's had is a right sided headache, nasal congestion, and generalized body aches x 2 days. Taking tylenol at home for symptom management. Is a diabetic.

## 2022-03-15 NOTE — Telephone Encounter (Signed)
Patient is calling to check on the prior authorization for her Dexcom G7 Sensor.
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
## 2022-03-16 LAB — SARS CORONAVIRUS 2 (TAT 6-24 HRS): SARS Coronavirus 2: POSITIVE — AB

## 2022-03-27 ENCOUNTER — Encounter: Payer: Self-pay | Admitting: Family Medicine

## 2022-03-27 ENCOUNTER — Ambulatory Visit (INDEPENDENT_AMBULATORY_CARE_PROVIDER_SITE_OTHER): Payer: Medicaid Other | Admitting: Family Medicine

## 2022-03-27 VITALS — BP 113/75 | HR 71 | Temp 97.6°F | Resp 16 | Wt 303.2 lb

## 2022-03-27 DIAGNOSIS — Z794 Long term (current) use of insulin: Secondary | ICD-10-CM

## 2022-03-27 DIAGNOSIS — F329 Major depressive disorder, single episode, unspecified: Secondary | ICD-10-CM

## 2022-03-27 DIAGNOSIS — E785 Hyperlipidemia, unspecified: Secondary | ICD-10-CM

## 2022-03-27 DIAGNOSIS — I10 Essential (primary) hypertension: Secondary | ICD-10-CM | POA: Diagnosis not present

## 2022-03-27 DIAGNOSIS — E1169 Type 2 diabetes mellitus with other specified complication: Secondary | ICD-10-CM | POA: Diagnosis not present

## 2022-03-27 DIAGNOSIS — Z87891 Personal history of nicotine dependence: Secondary | ICD-10-CM
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
## 2022-03-27 NOTE — Progress Notes (Unsigned)
Patient is here for their 2  month follow-up Patient has no concerns today Care gaps have been discussed with patient

## 2022-03-29 ENCOUNTER — Encounter: Payer: Self-pay | Admitting: Family Medicine

## 2022-04-01 ENCOUNTER — Ambulatory Visit
Admission: EM | Admit: 2022-04-01 | Discharge: 2022-04-01 | Disposition: A | Discharge status: Home / Self Care | Payer: Medicaid Other | Attending: Internal Medicine | Admitting: Internal Medicine

## 2022-04-01 DIAGNOSIS — J0101 Acute recurrent maxillary sinusitis: Secondary | ICD-10-CM | POA: Diagnosis not present

## 2022-04-01 MED ORDER — AMOXICILLIN-POT CLAVULANATE 875-125 MG PO TABS: 1.0000 | ORAL_TABLET | Freq: Two times a day (BID) | ORAL | 0 refills | Status: DC
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
## 2022-04-01 NOTE — Discharge Instructions (Signed)
I am treating you for sinus infection with antibiotic.  Take this with food to avoid stomach upset.  Follow-up if symptoms persist or worsen.  I have provided ear, nose, throat specialist for you to follow-up given recurrent sinus infections as well.

## 2022-04-01 NOTE — ED Triage Notes (Signed)
Pt c/o nasal congestion with drainage, headache,   Onset ~ 1 week ago   Concerned for sinus infection

## 2022-04-08 ENCOUNTER — Emergency Department (HOSPITAL_COMMUNITY)
Admission: EM | Admit: 2022-04-08 | Discharge: 2022-04-08 | Disposition: A | Discharge status: Home / Self Care | Payer: Medicaid Other | Attending: Emergency Medicine | Admitting: Emergency Medicine

## 2022-04-08 ENCOUNTER — Other Ambulatory Visit: Payer: Self-pay

## 2022-04-08 DIAGNOSIS — E119 Type 2 diabetes mellitus without complications: Secondary | ICD-10-CM | POA: Insufficient documentation

## 2022-04-08 DIAGNOSIS — Z7984 Long term (current) use of oral hypoglycemic drugs: Secondary | ICD-10-CM | POA: Diagnosis not present

## 2022-04-08 DIAGNOSIS — J45909 Unspecified asthma, uncomplicated: Secondary | ICD-10-CM | POA: Diagnosis not present

## 2022-04-08 DIAGNOSIS — R519 Headache, unspecified: Secondary | ICD-10-CM | POA: Insufficient documentation

## 2022-04-08 DIAGNOSIS — Z7982 Long term (current) use of aspirin: Secondary | ICD-10-CM | POA: Insufficient documentation

## 2022-04-08 DIAGNOSIS — Z79899 Other long term (current) drug therapy: Secondary | ICD-10-CM | POA: Insufficient documentation

## 2022-04-08 DIAGNOSIS — I10 Essential (primary) hypertension: Secondary | ICD-10-CM | POA: Diagnosis not present

## 2022-04-08 DIAGNOSIS — Z794 Long term (current) use of insulin: Secondary | ICD-10-CM | POA: Insufficient documentation

## 2022-04-08 MED ORDER — KETOROLAC TROMETHAMINE 15 MG/ML IJ SOLN
15.0000 mg | Freq: Once | INTRAMUSCULAR | Status: AC
  Administered 2022-04-08: 15 mg via INTRAMUSCULAR
  Filled 2022-04-08: qty 1

## 2022-04-08 MED ORDER — DIPHENHYDRAMINE HCL 25 MG PO CAPS
25.0000 mg | ORAL_CAPSULE | Freq: Once | ORAL | Status: DC
  Filled 2022-04-08: qty 1

## 2022-04-08 MED ORDER — PROCHLORPERAZINE EDISYLATE 10 MG/2ML IJ SOLN
10.0000 mg | Freq: Once | INTRAMUSCULAR | Status: AC
  Administered 2022-04-08: 10 mg via INTRAMUSCULAR
  Filled 2022-04-08: qty 2
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
## 2022-04-08 NOTE — ED Triage Notes (Signed)
Patient here with complaint of headache that started there days ago. Patient states she has been taking tylenol with temporary relief. Patient is alert, oriented, ambulating independently with steady gait and is in no apparent distress at this time.

## 2022-04-08 NOTE — ED Notes (Signed)
AVS reviewed with pt prior to discharge. Pt verbalizes understanding. Belongings with pt upon depart. Ambulatory to POV by self. 

## 2022-04-08 NOTE — Discharge Instructions (Signed)
Your headache improved after medications in the emergency department.  We discussed obtaining a CT scan however you state you did not want to wait for that and would rather follow-up with a headache specialist.  Also follow-up with your primary care doctor.  For any concerning symptoms return to the emergency department.

## 2022-04-10 ENCOUNTER — Encounter: Payer: Self-pay | Admitting: Neurology

## 2022-04-25 ENCOUNTER — Ambulatory Visit: Payer: Medicaid Other | Admitting: Neurology

## 2022-04-25 ENCOUNTER — Encounter: Payer: Self-pay | Admitting: Neurology

## 2022-04-25 VITALS — BP 131/68 | HR 90 | Ht 66.0 in | Wt 299.0 lb

## 2022-04-25 DIAGNOSIS — M542 Cervicalgia: Secondary | ICD-10-CM | POA: Diagnosis not present

## 2022-04-25 DIAGNOSIS — R519 Headache, unspecified: Secondary | ICD-10-CM

## 2022-04-25 MED ORDER — CYCLOBENZAPRINE HCL 5 MG PO TABS: 5.0000 mg | ORAL_TABLET | Freq: Every day | ORAL | 5 refills | Status: DC

## 2022-04-25 NOTE — Patient Instructions (Addendum)
Start neck physiotherapy  Start flexeril  at bedtime  MRI brain   Please follow-up with primary care doctor about your options for OSA  Return to clinic in 4 months
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
## 2022-05-15 ENCOUNTER — Telehealth: Payer: Self-pay | Admitting: Neurology

## 2022-05-15 ENCOUNTER — Ambulatory Visit: Payer: Medicaid Other | Admitting: Internal Medicine

## 2022-05-15 MED ORDER — DIAZEPAM 5 MG PO TABS: ORAL_TABLET | ORAL | 0 refills | Status: DC

## 2022-05-15 NOTE — Telephone Encounter (Signed)
Pt called in stating she has an MRI on 05/29/22 and is claustrophobic. She is requesting some medication to help her with that MRI. She would like it to go to [facility] on [address]

## 2022-05-15 NOTE — Telephone Encounter (Signed)
Rx for valium sent.  She needs to have a driver to take her to appointment.

## 2022-05-16 ENCOUNTER — Ambulatory Visit: Payer: Medicaid Other

## 2022-05-16 NOTE — Telephone Encounter (Signed)
Called patient and informed her that Valium has been sent and to have a driver to and from her MRI. Patient verbalized understanding and had no further questions or concerns.

## 2022-05-29 ENCOUNTER — Ambulatory Visit
Admission: RE | Admit: 2022-05-29 | Discharge: 2022-05-29 | Disposition: A | Discharge status: Home / Self Care | Payer: Medicaid Other | Source: Ambulatory Visit | Attending: Neurology | Admitting: Neurology

## 2022-05-29 DIAGNOSIS — R519 Headache, unspecified: Secondary | ICD-10-CM

## 2022-05-29 DIAGNOSIS — M542 Cervicalgia: Secondary | ICD-10-CM

## 2022-06-01 ENCOUNTER — Telehealth: Payer: Self-pay | Admitting: Neurology

## 2022-06-01 NOTE — Telephone Encounter (Signed)
Results of MRI discussed with patient which does not show any worrisome findings to explain headaches.  She has partially empty sella whichis most likely incidental.  Patient denies vision changes and overall headaches are improved.  She still has headaches upon waking up, but they quickly subside.  She feels the flexeril is helping.  All questions answered.

## 2022-06-06 LAB — AMB RESULTS CONSOLE CBG: Glucose: 231

## 2022-06-06 NOTE — Progress Notes (Signed)
Pt has PCP. Pt asked SDOH questions verbally, not needs at this time. Pt given lifestyle medicine handout.

## 2022-06-08 ENCOUNTER — Telehealth: Payer: Self-pay | Admitting: Surgery

## 2022-06-08 NOTE — Telephone Encounter (Signed)
-----   Message from Clide Cliff, MD sent at 06/06/2022  9:09 AM EDT ----- Regarding: 84mo fu > July Hi,  Can we make sure this pt has  84mo follow-up. Should be around mid July.  Tara Keller

## 2022-06-08 NOTE — Telephone Encounter (Signed)
Called patient to scheduled follow up appointment for July, per Dr Alvester Morin. Patient scheduled for 7/15 at 1pm. Patient verbalized agreement to this date and time and had no other concerns at this time.

## 2022-06-12 ENCOUNTER — Encounter: Payer: Self-pay | Admitting: Podiatry

## 2022-06-12 ENCOUNTER — Ambulatory Visit (INDEPENDENT_AMBULATORY_CARE_PROVIDER_SITE_OTHER): Payer: Medicaid Other | Admitting: Podiatry

## 2022-06-12 DIAGNOSIS — E1142 Type 2 diabetes mellitus with diabetic polyneuropathy: Secondary | ICD-10-CM

## 2022-06-12 DIAGNOSIS — B351 Tinea unguium: Secondary | ICD-10-CM | POA: Diagnosis not present

## 2022-06-12 DIAGNOSIS — M79675 Pain in left toe(s): Secondary | ICD-10-CM

## 2022-06-12 DIAGNOSIS — M79674 Pain in right toe(s): Secondary | ICD-10-CM

## 2022-06-12 DIAGNOSIS — Z794 Long term (current) use of insulin: Secondary | ICD-10-CM

## 2022-06-12 NOTE — Progress Notes (Signed)
Complaint:  Visit Type: Patient returns to my office for continued preventative foot care services. Complaint: Patient states" my nails have grown long and thick and become painful to walk and wear shoes" Patient has been diagnosed with DM with neuropathy. The patient presents for preventative foot care services.  Podiatric Exam: Vascular: dorsalis pedis and posterior tibial pulses are palpable bilateral. Capillary return is immediate. Temperature gradient is WNL. Skin turgor WNL  Sensorium: Diminished  Semmes Weinstein monofilament test. Normal tactile sensation bilaterally. Nail Exam: Pt has thick disfigured discolored nails with subungual debris noted bilateral entire nail hallux through fifth toenails Ulcer Exam: There is no evidence of ulcer or pre-ulcerative changes or infection. Orthopedic Exam: Muscle tone and strength are WNL. No limitations in general ROM. No crepitus or effusions noted. Foot type and digits show no abnormalities. HAV  B/L. Skin: No Porokeratosis. No infection or ulcers  Diagnosis:  Onychomycosis, , Pain in right toe, pain in left toes  Treatment & Plan Procedures and Treatment: Consent by patient was obtained for treatment procedures.   Debridement of mycotic and hypertrophic toenails, 1 through 5 bilateral and clearing of subungual debris. No ulceration, no infection noted. Discussed possible nail surgery in future. Return Visit-Office Procedure: Patient instructed to return to the office for a follow up visit 3 months for continued evaluation and treatment.    Helane Gunther DPM

## 2022-06-19 NOTE — Progress Notes (Signed)
Pt attended a screening event on 06/06/22 at [facility], where screening results were BP 130/84 and Glucose 231. At the event, Pt confirmed Dr.Wilson as her PCP and Pt was asked SDOH question verbally and answered not needs at this time. Per chart review, Pt was last seen by PCP on 03/27/22 for Type 2 diabetes mellitus and has upcomming appointment on 06/28/22. Pt also has upcomming Endo appointment on 09/15/22. Pt screening results was sent to PCP through [facility] message. Pt was seen today 06/28/22 by PCP for Type 2 diabetes mellitus follow up and was advised to return in 4 weeks for follow up. No additional health equity support indicated at this time.

## 2022-06-27 ENCOUNTER — Ambulatory Visit: Payer: Medicaid Other | Admitting: Family Medicine

## 2022-06-28 ENCOUNTER — Encounter: Payer: Self-pay | Admitting: Family Medicine

## 2022-06-28 ENCOUNTER — Ambulatory Visit (INDEPENDENT_AMBULATORY_CARE_PROVIDER_SITE_OTHER): Payer: Medicaid Other | Admitting: Family Medicine

## 2022-06-28 VITALS — BP 136/80 | HR 74 | Temp 98.1°F | Resp 16 | Wt 309.4 lb

## 2022-06-28 DIAGNOSIS — I10 Essential (primary) hypertension: Secondary | ICD-10-CM

## 2022-06-28 DIAGNOSIS — F329 Major depressive disorder, single episode, unspecified: Secondary | ICD-10-CM

## 2022-06-28 DIAGNOSIS — Z794 Long term (current) use of insulin: Secondary | ICD-10-CM

## 2022-06-28 DIAGNOSIS — E1169 Type 2 diabetes mellitus with other specified complication: Secondary | ICD-10-CM

## 2022-06-28 DIAGNOSIS — Z7984 Long term (current) use of oral hypoglycemic drugs: Secondary | ICD-10-CM | POA: Diagnosis not present

## 2022-06-28 DIAGNOSIS — Z6841 Body Mass Index (BMI) 40.0 and over, adult: Secondary | ICD-10-CM

## 2022-06-28 LAB — POCT GLYCOSYLATED HEMOGLOBIN (HGB A1C): Hemoglobin A1C: 9 % — AB (ref 4.0–5.6)

## 2022-06-28 MED ORDER — ACCU-CHEK GUIDE VI STRP: ORAL_STRIP | 12 refills | Status: AC

## 2022-06-28 MED ORDER — PANTOPRAZOLE SODIUM 40 MG PO TBEC: 40.0000 mg | DELAYED_RELEASE_TABLET | Freq: Two times a day (BID) | ORAL | 11 refills | Status: AC

## 2022-06-28 MED ORDER — MELOXICAM 7.5 MG PO TABS: ORAL_TABLET | ORAL | 0 refills | Status: DC

## 2022-06-28 MED ORDER — ACCU-CHEK FASTCLIX LANCETS MISC: 1 refills | Status: DC

## 2022-06-28 MED ORDER — PHENTERMINE HCL 37.5 MG PO TABS: 37.5000 mg | ORAL_TABLET | Freq: Every day | ORAL | 0 refills | Status: DC

## 2022-06-28 MED ORDER — PROPRANOLOL HCL 10 MG PO TABS: 10.0000 mg | ORAL_TABLET | Freq: Two times a day (BID) | ORAL | 0 refills | Status: AC

## 2022-06-28 MED ORDER — LISINOPRIL 10 MG PO TABS: ORAL_TABLET | ORAL | 1 refills | Status: DC

## 2022-06-28 MED ORDER — LANTUS SOLOSTAR 100 UNIT/ML ~~LOC~~ SOPN: 70.0000 [IU] | PEN_INJECTOR | Freq: Every day | SUBCUTANEOUS | 4 refills | Status: DC

## 2022-06-28 MED ORDER — GABAPENTIN 300 MG PO CAPS: 300.0000 mg | ORAL_CAPSULE | Freq: Three times a day (TID) | ORAL | 1 refills | Status: DC

## 2022-06-28 MED ORDER — METHOCARBAMOL 500 MG PO TABS: 500.0000 mg | ORAL_TABLET | Freq: Four times a day (QID) | ORAL | 5 refills | Status: DC | PRN

## 2022-06-28 MED ORDER — METFORMIN HCL ER 750 MG PO TB24: 750.0000 mg | ORAL_TABLET | Freq: Two times a day (BID) | ORAL | 3 refills | Status: DC

## 2022-06-28 MED ORDER — NOVOLOG FLEXPEN 100 UNIT/ML ~~LOC~~ SOPN: PEN_INJECTOR | SUBCUTANEOUS | 3 refills | Status: DC

## 2022-06-28 NOTE — Progress Notes (Unsigned)
Patient is here for their 3 month follow-up Patient has no concerns today Care gaps have been discussed with patient  
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
## 2022-07-21 ENCOUNTER — Encounter: Payer: Self-pay | Admitting: Psychiatry

## 2022-07-24 ENCOUNTER — Inpatient Hospital Stay: Payer: MEDICAID | Attending: Psychiatry | Admitting: Psychiatry

## 2022-07-24 ENCOUNTER — Encounter: Payer: Self-pay | Admitting: Psychiatry

## 2022-07-24 VITALS — BP 129/80 | HR 74 | Temp 98.2°F | Resp 17 | Wt 304.8 lb

## 2022-07-24 DIAGNOSIS — C541 Malignant neoplasm of endometrium: Secondary | ICD-10-CM

## 2022-07-24 DIAGNOSIS — Z8542 Personal history of malignant neoplasm of other parts of uterus: Secondary | ICD-10-CM

## 2022-07-24 DIAGNOSIS — Z809 Family history of malignant neoplasm, unspecified: Secondary | ICD-10-CM | POA: Diagnosis not present

## 2022-07-24 DIAGNOSIS — Z90722 Acquired absence of ovaries, bilateral: Secondary | ICD-10-CM | POA: Insufficient documentation

## 2022-07-24 DIAGNOSIS — Z90710 Acquired absence of both cervix and uterus: Secondary | ICD-10-CM | POA: Diagnosis not present
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
## 2022-07-24 NOTE — Patient Instructions (Signed)
It was a pleasure to see you in clinic today. - Exam normal today. - Referral placed to genetics. - Return visit planned for 6 months  Thank you very much for allowing me to provide care for you today.  I appreciate your confidence in choosing our Gynecologic Oncology team at [facility].  If you have any questions about your visit today please call our office or send [name] a MyChart message and we will get back to you as soon as possible.

## 2022-07-25 ENCOUNTER — Other Ambulatory Visit: Payer: Self-pay | Admitting: Family Medicine

## 2022-08-08 ENCOUNTER — Other Ambulatory Visit: Payer: Self-pay | Admitting: Family Medicine

## 2022-08-11 LAB — HM MAMMOGRAPHY

## 2022-08-20 ENCOUNTER — Ambulatory Visit (HOSPITAL_COMMUNITY): Payer: MEDICAID

## 2022-08-22 ENCOUNTER — Ambulatory Visit
Admission: RE | Admit: 2022-08-22 | Discharge: 2022-08-22 | Disposition: A | Discharge status: Home / Self Care | Payer: MEDICAID | Source: Ambulatory Visit | Attending: Physician Assistant | Admitting: Physician Assistant

## 2022-08-22 VITALS — BP 120/83 | HR 90 | Temp 98.5°F | Resp 20 | Ht 66.0 in | Wt 309.0 lb

## 2022-08-22 DIAGNOSIS — M5416 Radiculopathy, lumbar region: Secondary | ICD-10-CM

## 2022-08-22 MED ORDER — PREDNISONE 20 MG PO TABS: 20.0000 mg | ORAL_TABLET | Freq: Every day | ORAL | 0 refills | Status: AC
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
## 2022-08-22 NOTE — ED Triage Notes (Signed)
"  My legs are really hurting, I do have arthritis in my knees but my whole leg (right/left) is hurting a lot". No injury.

## 2022-08-28 ENCOUNTER — Ambulatory Visit: Payer: Medicaid Other | Admitting: Neurology

## 2022-08-28 ENCOUNTER — Encounter: Payer: Self-pay | Admitting: Family Medicine

## 2022-09-01 ENCOUNTER — Other Ambulatory Visit: Payer: Self-pay | Admitting: Family Medicine

## 2022-09-08 ENCOUNTER — Other Ambulatory Visit: Payer: Self-pay | Admitting: Medical Genetics

## 2022-09-08 DIAGNOSIS — Z006 Encounter for examination for normal comparison and control in clinical research program: Secondary | ICD-10-CM

## 2022-09-12 ENCOUNTER — Encounter: Payer: Self-pay | Admitting: Podiatry

## 2022-09-12 ENCOUNTER — Ambulatory Visit (INDEPENDENT_AMBULATORY_CARE_PROVIDER_SITE_OTHER): Payer: MEDICAID | Admitting: Podiatry

## 2022-09-12 DIAGNOSIS — M79675 Pain in left toe(s): Secondary | ICD-10-CM | POA: Diagnosis not present

## 2022-09-12 DIAGNOSIS — E1142 Type 2 diabetes mellitus with diabetic polyneuropathy: Secondary | ICD-10-CM

## 2022-09-12 DIAGNOSIS — M79674 Pain in right toe(s): Secondary | ICD-10-CM | POA: Diagnosis not present

## 2022-09-12 DIAGNOSIS — B351 Tinea unguium: Secondary | ICD-10-CM | POA: Diagnosis not present

## 2022-09-12 NOTE — Progress Notes (Signed)
Complaint:  Visit Type: Patient returns to my office for continued preventative foot care services. Complaint: Patient states" my nails have grown long and thick and become painful to walk and wear shoes" Patient has been diagnosed with DM with neuropathy. The patient presents for preventative foot care services.  Podiatric Exam: Vascular: dorsalis pedis and posterior tibial pulses are palpable bilateral. Capillary return is immediate. Temperature gradient is WNL. Skin turgor WNL  Sensorium: Diminished  Semmes Weinstein monofilament test. Normal tactile sensation bilaterally. Nail Exam: Pt has thick disfigured discolored nails with subungual debris noted bilateral entire nail hallux through fifth toenails Ulcer Exam: There is no evidence of ulcer or pre-ulcerative changes or infection. Orthopedic Exam: Muscle tone and strength are WNL. No limitations in general ROM. No crepitus or effusions noted. Foot type and digits show no abnormalities. HAV  B/L. Skin: No Porokeratosis. No infection or ulcers  Diagnosis:  Onychomycosis, , Pain in right toe, pain in left toes  Treatment & Plan Procedures and Treatment: Consent by patient was obtained for treatment procedures.   Debridement of mycotic and hypertrophic toenails, 1 through 5 bilateral and clearing of subungual debris. No ulceration, no infection noted.  Return Visit-Office Procedure: Patient instructed to return to the office for a follow up visit 3 months for continued evaluation and treatment.    Gregory Mayer DPM 

## 2022-09-15 ENCOUNTER — Encounter: Payer: Self-pay | Admitting: Internal Medicine

## 2022-09-15 ENCOUNTER — Ambulatory Visit (INDEPENDENT_AMBULATORY_CARE_PROVIDER_SITE_OTHER): Payer: MEDICAID | Admitting: Internal Medicine

## 2022-09-15 VITALS — BP 124/80 | HR 82 | Ht 66.0 in | Wt 310.0 lb

## 2022-09-15 DIAGNOSIS — E1169 Type 2 diabetes mellitus with other specified complication: Secondary | ICD-10-CM | POA: Diagnosis not present

## 2022-09-15 DIAGNOSIS — Z7984 Long term (current) use of oral hypoglycemic drugs: Secondary | ICD-10-CM

## 2022-09-15 DIAGNOSIS — E1165 Type 2 diabetes mellitus with hyperglycemia: Secondary | ICD-10-CM | POA: Diagnosis not present

## 2022-09-15 DIAGNOSIS — F321 Major depressive disorder, single episode, moderate: Secondary | ICD-10-CM | POA: Diagnosis not present

## 2022-09-15 DIAGNOSIS — Z794 Long term (current) use of insulin: Secondary | ICD-10-CM | POA: Diagnosis not present

## 2022-09-15 LAB — POCT GLYCOSYLATED HEMOGLOBIN (HGB A1C): Hemoglobin A1C: 9.2 % — AB (ref 4.0–5.6)

## 2022-09-15 MED ORDER — GLIPIZIDE ER 5 MG PO TB24: 5.0000 mg | ORAL_TABLET | Freq: Every day | ORAL | 3 refills | Status: DC

## 2022-09-15 MED ORDER — METFORMIN HCL ER 750 MG PO TB24: 750.0000 mg | ORAL_TABLET | Freq: Two times a day (BID) | ORAL | 3 refills | Status: DC

## 2022-09-15 MED ORDER — INSULIN PEN NEEDLE 31G X 5 MM MISC: 1.0000 | Freq: Four times a day (QID) | 3 refills | Status: DC

## 2022-09-15 MED ORDER — LANTUS SOLOSTAR 100 UNIT/ML ~~LOC~~ SOPN
80.0000 [IU] | PEN_INJECTOR | Freq: Every day | SUBCUTANEOUS | 4 refills | Status: DC
Start: 2022-09-15 — End: 2022-12-26

## 2022-09-15 MED ORDER — NOVOLOG FLEXPEN 100 UNIT/ML ~~LOC~~ SOPN: PEN_INJECTOR | SUBCUTANEOUS | 3 refills | Status: DC

## 2022-09-15 MED ORDER — DAPAGLIFLOZIN PROPANEDIOL 10 MG PO TABS: 10.0000 mg | ORAL_TABLET | Freq: Every day | ORAL | 3 refills | Status: DC
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
## 2022-09-15 NOTE — Patient Instructions (Signed)
-   Start Glipizide 5 mg, 1 tablet before Breakfast  - Continue Metformin 750 mg to 1 tablet TWICE a day  - Continue Farxiga 10 mg daily  - Increase Lantus to 80 units ONCE a day  - Continue  Novolog 14 units with each meal    - Novolog correctional insulin: ADD extra units on insulin to your meal-time Novolog dose if your blood sugars are higher than 160. Use the scale below to help guide you:   Blood sugar before meal Number of units to inject  Less than 160 0 unit  161 - 190 1 units  191 - 220 2 units  221 - 250 3 units  251 - 280 4 units  281 - 310 5 units  311 - 340 6 units  341 - 370 7 units  371 - 400 8 units      HOW TO TREAT LOW BLOOD SUGARS (Blood sugar LESS THAN 70 MG/DL) Please follow the RULE OF 15 for the treatment of hypoglycemia treatment (when your (blood sugars are less than 70 mg/dL)   STEP 1: Take 15 grams of carbohydrates when your blood sugar is low, which includes:  3-4 GLUCOSE TABS  OR 3-4 OZ OF JUICE OR REGULAR SODA OR ONE TUBE OF GLUCOSE GEL    STEP 2: RECHECK blood sugar in 15 MINUTES STEP 3: If your blood sugar is still low at the 15 minute recheck --> then, go back to STEP 1 and treat AGAIN with another 15 grams of carbohydrates.

## 2022-09-28 ENCOUNTER — Ambulatory Visit: Payer: Medicaid Other | Admitting: Family Medicine

## 2022-10-02 ENCOUNTER — Ambulatory Visit (INDEPENDENT_AMBULATORY_CARE_PROVIDER_SITE_OTHER): Payer: MEDICAID | Admitting: Family Medicine

## 2022-10-02 ENCOUNTER — Encounter: Payer: Self-pay | Admitting: Family Medicine

## 2022-10-02 VITALS — BP 126/79 | HR 89 | Temp 98.3°F | Ht 66.0 in | Wt 310.0 lb

## 2022-10-02 DIAGNOSIS — Z6841 Body Mass Index (BMI) 40.0 and over, adult: Secondary | ICD-10-CM

## 2022-10-02 DIAGNOSIS — K760 Fatty (change of) liver, not elsewhere classified: Secondary | ICD-10-CM | POA: Diagnosis not present

## 2022-10-02 DIAGNOSIS — M1712 Unilateral primary osteoarthritis, left knee: Secondary | ICD-10-CM

## 2022-10-02 DIAGNOSIS — E1142 Type 2 diabetes mellitus with diabetic polyneuropathy: Secondary | ICD-10-CM | POA: Diagnosis not present

## 2022-10-02 DIAGNOSIS — Z794 Long term (current) use of insulin: Secondary | ICD-10-CM

## 2022-10-02 NOTE — Assessment & Plan Note (Signed)
Lab Results  Component Value Date   HGBA1C 9.2 (A) 09/15/2022   Poor control of type II diabetes, managed by Dr Lonzo Cloud.  She has gained weight on insulin and glipizide.  She reports a poor diet with a lack of regular exercise.    Consider bariatric surgery given her Type II diabetes.  Begin working on dietary change, weight reduction and increased physical activity.

## 2022-10-02 NOTE — Assessment & Plan Note (Signed)
She has a hx of hepatic steatosis.  This was confirmed on CT scan in 2015 and on MRCP in 2015.  Her most recent CT scan shows surgical absence of a gall bladder with no bile duct stones.    Begin active plan for weight reduction for the treatment of hepatic steatosis Consider staging with a NASH fibrosure scan

## 2022-10-02 NOTE — Assessment & Plan Note (Signed)
Bilateral knee pain has limited her ability to walk which has increased her weight over time.  She takes Zanafex 4 mg q 8 hr prn and meloxican 7.5 mg daily prn for pain.    Consider options such as water exercise or chair exercise as she begins weight reduction plan
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
## 2022-10-07 ENCOUNTER — Other Ambulatory Visit: Payer: Self-pay | Admitting: Family Medicine

## 2022-10-10 ENCOUNTER — Inpatient Hospital Stay: Payer: MEDICAID

## 2022-10-10 ENCOUNTER — Encounter: Payer: Self-pay | Admitting: Genetic Counselor

## 2022-10-10 ENCOUNTER — Inpatient Hospital Stay: Payer: MEDICAID | Attending: Psychiatry | Admitting: Genetic Counselor

## 2022-10-10 ENCOUNTER — Other Ambulatory Visit: Payer: Self-pay | Admitting: Genetic Counselor

## 2022-10-10 DIAGNOSIS — Z803 Family history of malignant neoplasm of breast: Secondary | ICD-10-CM

## 2022-10-10 DIAGNOSIS — Z8042 Family history of malignant neoplasm of prostate: Secondary | ICD-10-CM

## 2022-10-10 DIAGNOSIS — C541 Malignant neoplasm of endometrium: Secondary | ICD-10-CM

## 2022-10-10 DIAGNOSIS — Z800 Family history of malignant neoplasm of digestive organs: Secondary | ICD-10-CM

## 2022-10-10 LAB — GENETIC SCREENING ORDER
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
## 2022-10-12 ENCOUNTER — Encounter: Payer: Self-pay | Admitting: Family Medicine

## 2022-10-12 ENCOUNTER — Ambulatory Visit (INDEPENDENT_AMBULATORY_CARE_PROVIDER_SITE_OTHER): Payer: MEDICAID | Admitting: Family Medicine

## 2022-10-12 VITALS — BP 108/72 | HR 90 | Temp 97.7°F | Resp 16 | Wt 312.8 lb

## 2022-10-12 DIAGNOSIS — I10 Essential (primary) hypertension: Secondary | ICD-10-CM

## 2022-10-12 DIAGNOSIS — K219 Gastro-esophageal reflux disease without esophagitis: Secondary | ICD-10-CM

## 2022-10-12 DIAGNOSIS — Z794 Long term (current) use of insulin: Secondary | ICD-10-CM | POA: Diagnosis not present

## 2022-10-12 DIAGNOSIS — E1142 Type 2 diabetes mellitus with diabetic polyneuropathy: Secondary | ICD-10-CM | POA: Diagnosis not present

## 2022-10-12 DIAGNOSIS — Z6841 Body Mass Index (BMI) 40.0 and over, adult: Secondary | ICD-10-CM

## 2022-10-13 LAB — MICROALBUMIN / CREATININE URINE RATIO
Creatinine, Urine: 41.3 mg/dL
Microalb/Creat Ratio: 16 mg/g{creat} (ref 0–29)
Microalbumin, Urine: 6.6 ug/mL
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
## 2022-10-18 ENCOUNTER — Ambulatory Visit (HOSPITAL_COMMUNITY): Payer: MEDICAID | Admitting: Mental Health

## 2022-10-18 DIAGNOSIS — F321 Major depressive disorder, single episode, moderate: Secondary | ICD-10-CM

## 2022-10-18 DIAGNOSIS — F4310 Post-traumatic stress disorder, unspecified: Secondary | ICD-10-CM

## 2022-10-18 DIAGNOSIS — F1421 Cocaine dependence, in remission: Secondary | ICD-10-CM
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
## 2022-10-20 DIAGNOSIS — F1421 Cocaine dependence, in remission: Secondary | ICD-10-CM | POA: Insufficient documentation

## 2022-10-20 DIAGNOSIS — F321 Major depressive disorder, single episode, moderate: Secondary | ICD-10-CM | POA: Insufficient documentation

## 2022-10-20 DIAGNOSIS — F4310 Post-traumatic stress disorder, unspecified: Secondary | ICD-10-CM | POA: Insufficient documentation

## 2022-11-02 ENCOUNTER — Other Ambulatory Visit: Payer: Self-pay

## 2022-11-02 ENCOUNTER — Encounter: Payer: Self-pay | Admitting: Genetic Counselor

## 2022-11-02 ENCOUNTER — Telehealth: Payer: Self-pay | Admitting: Genetic Counselor

## 2022-11-02 ENCOUNTER — Ambulatory Visit: Payer: Self-pay | Admitting: Genetic Counselor

## 2022-11-02 ENCOUNTER — Ambulatory Visit
Admission: EM | Admit: 2022-11-02 | Discharge: 2022-11-02 | Disposition: A | Discharge status: Home / Self Care | Payer: MEDICAID | Attending: Family Medicine | Admitting: Family Medicine

## 2022-11-02 ENCOUNTER — Encounter: Payer: Self-pay | Admitting: *Deleted

## 2022-11-02 DIAGNOSIS — Z1379 Encounter for other screening for genetic and chromosomal anomalies: Secondary | ICD-10-CM

## 2022-11-02 DIAGNOSIS — R1013 Epigastric pain: Secondary | ICD-10-CM

## 2022-11-02 LAB — POCT URINALYSIS DIP (MANUAL ENTRY)
Bilirubin, UA: NEGATIVE
Glucose, UA: 500 mg/dL — AB
Ketones, POC UA: NEGATIVE mg/dL
Leukocytes, UA: NEGATIVE
Nitrite, UA: NEGATIVE
Protein Ur, POC: NEGATIVE mg/dL
Spec Grav, UA: 1.02 (ref 1.010–1.025)
Urobilinogen, UA: 0.2 U/dL
pH, UA: 7 (ref 5.0–8.0)

## 2022-11-02 LAB — POCT FASTING CBG KUC MANUAL ENTRY: POCT Glucose (KUC): 277 mg/dL — AB (ref 70–99)

## 2022-11-02 MED ORDER — ONDANSETRON 4 MG PO TBDP: 4.0000 mg | ORAL_TABLET | Freq: Three times a day (TID) | ORAL | 0 refills | Status: DC | PRN

## 2022-11-02 MED ORDER — ONDANSETRON 4 MG PO TBDP
4.0000 mg | ORAL_TABLET | Freq: Once | ORAL | Status: AC
  Administered 2022-11-02: 4 mg via ORAL

## 2022-11-02 NOTE — Progress Notes (Signed)
HPI:  Ms. Nam was previously seen in the [facility] due to a personal and family history of cancer and concerns regarding a hereditary predisposition to cancer. Please refer to our prior cancer genetics clinic note for more information regarding our discussion, assessment and recommendations, at the time. Ms. Coley recent genetic test results were disclosed to her, as were recommendations warranted by these results. These results and recommendations are discussed in more detail below.  CANCER HISTORY:  Oncology History  Endometrial cancer (HCC)  08/21/2018 Pathology Results   Endometrial biopsy: FIGO gr1 endometrioid carcinoma   08/21/2018 Initial Diagnosis   Endometrial ca (HCC)   10/17/2018 Procedure   Levonorgestrel IUD placed   04/22/2019 Pathology Results   A. ENDOMETRIUM, BIOPSY:  - Decidualized endometrium (progestin effect).  - No malignancy identified.    08/04/2020 Pathology Results   A. ENDOMETRIAL BIOPSY:  -  Endometrioid carcinoma, FIGO grade 1  -  Endometrial polyp  -  Endometrium with progestin effect    12/19/2021 Pathology Results   A. ENDOMETRIAL BIOPSY:  Endometrioid adenocarcinoma, FIGO grade 1.  See comment.    01/10/2022 Surgery   Robotic-assisted total laparoscopic hysterectomy, bilateral salpingo-oophorectomy, bilateral sentinel lymph node evaluation and biopsy   01/10/2022 Pathology Results   FINAL MICROSCOPIC DIAGNOSIS:  A. LYMPH NODE, SENTINEL, RIGHT EXTERNAL ILIAC VEIN, EXCISION: -1 benign lymph node, negative for malignancy (0/1).  B. LYMPH NODE, SENTINEL, LEFT EXTERNAL ILIAC VEIN #1, BIOPSY: -  1 benign lymph node, negative for malignancy (0/1).  C. LYMPH NODE, SENTINEL, LEFT EXTERNAL ILIAC VEIN #2, BIOPSY: -  1 benign lymph node with endosalpingosis, negative for malignancy (0/1).  D. UTERUS, CERVIX, BILATERAL FALLOPIAN TUBES AND OVARIES: -  Endometrioid adenocarcinoma, FIGO grade 1 of 3 with 40% myometrial invasion -   Unremarkable cervix, negative for dysplasia. -  Myometrium with benign leiomyomata -  Unremarkable bilateral fallopian tubes and ovaries. pT1a pN0; FIGO Stage IA   IHC EXPRESSION RESULTS TEST           RESULT MLH1:          LOSS OF NUCLEAR EXPRESSION MSH2:          Preserved nuclear expression MSH6:          Preserved nuclear expression PMS2:          LOSS OF NUCLEAR EXPRESSION   P53 wild type   01/10/2022 Cancer Staging   Staging form: Corpus Uteri - Carcinoma and Carcinosarcoma, AJCC 8th Edition - Pathologic stage from 01/10/2022: FIGO Stage IA (ypT1a, pN0, cM0) - Signed by Clide Cliff, MD on 01/29/2022 Histopathologic type: Endometrioid adenocarcinoma, NOS Stage prefix: Post-therapy Histologic grade (G): G1 Histologic grading system: 3 grade system Lymph-vascular invasion (LVI): LVI not present (absent)/not identified   10/31/2022 Genetic Testing   Negative genetic testing on the CancerNext-Expanded+RNAinsight panel.  MSH2 c.114C>G VUS identified.  The report date is October 31, 2022.  The CancerNext-Expanded gene panel offered by [facility] and includes sequencing and rearrangement analysis for the following 77 genes: AIP, ALK, APC*, ATM*, AXIN2, BAP1, BARD1, BMPR1A, BRCA1*, BRCA2*, BRIP1*, CDC73, CDH1*, CDK4, CDKN1B, CDKN2A, CHEK2*, CTNNA1, DICER1, FH, FLCN, KIF1B, LZTR1, MAX, MEN1, MET, MLH1*, MSH2*, MSH3, MSH6*, MUTYH*, NF1*, NF2, NTHL1, PALB2*, PHOX2B, PMS2*, POT1, PRKAR1A, PTCH1, PTEN*, RAD51C*, RAD51D*, RB1, RET, SDHA, SDHAF2, SDHB, SDHC, SDHD, SMAD4, SMARCA4, SMARCB1, SMARCE1, STK11, SUFU, TMEM127, TP53*, TSC1, TSC2, and VHL (sequencing and deletion/duplication); EGFR, EGLN1, HOXB13, KIT, MITF, PDGFRA, POLD1, and POLE (sequencing only); EPCAM and GREM1 (deletion/duplication only).  DNA and RNA analyses performed for * genes.      FAMILY HISTORY:  We obtained a detailed, 4-generation family history.  Significant diagnoses are listed below: Family History  Problem  Relation Age of Onset   Diabetes Mother    Stroke Mother    Hypertension Mother    Cirrhosis Father    Breast cancer Sister 19   Heart attack Sister    Stomach cancer Maternal Aunt        92   Colon cancer Maternal Uncle    Prostate cancer Maternal Uncle        54   Breast cancer Maternal Grandmother    Other Daughter        died at age 58   Colon polyps Neg Hx    Esophageal cancer Neg Hx    Rectal cancer Neg Hx    Ovarian cancer Neg Hx        The patient has three daughters and a son who are cancer free.  She has two full sisters, and three maternal half brothers and four sisters.  One full sister died of breast cancer at 19.  Both parents are deceased.   The patient's father's history is unknown.   The patient's mother died of COVID.  She had multiple siblings, but one brother had colon cancer, one brother had prostate cancer and a sister had stomach cancer.  One sibling had a son who had brain cancer. The maternal grandparents are deceased.  The grandmother had breast cancer.    Ms. Gerstel is unaware of previous family history of genetic testing for hereditary cancer risks. There is no reported Ashkenazi Jewish ancestry. There is no known consanguinity  GENETIC TEST RESULTS: Genetic testing reported out on October 31, 2022 through the CancerNext-Expanded+RNAinsight cancer panel found no pathogenic mutations. The CancerNext-Expanded gene panel offered by [facility] and includes sequencing and rearrangement analysis for the following 77 genes: AIP, ALK, APC*, ATM*, AXIN2, BAP1, BARD1, BMPR1A, BRCA1*, BRCA2*, BRIP1*, CDC73, CDH1*, CDK4, CDKN1B, CDKN2A, CHEK2*, CTNNA1, DICER1, FH, FLCN, KIF1B, LZTR1, MAX, MEN1, MET, MLH1*, MSH2*, MSH3, MSH6*, MUTYH*, NF1*, NF2, NTHL1, PALB2*, PHOX2B, PMS2*, POT1, PRKAR1A, PTCH1, PTEN*, RAD51C*, RAD51D*, RB1, RET, SDHA, SDHAF2, SDHB, SDHC, SDHD, SMAD4, SMARCA4, SMARCB1, SMARCE1, STK11, SUFU, TMEM127, TP53*, TSC1, TSC2, and VHL (sequencing and  deletion/duplication); EGFR, EGLN1, HOXB13, KIT, MITF, PDGFRA, POLD1, and POLE (sequencing only); EPCAM and GREM1 (deletion/duplication only). DNA and RNA analyses performed for * genes. The test report has been scanned into EPIC and is located under the Molecular Pathology section of the Results Review tab.  A portion of the result report is included below for reference.     We discussed with Ms. Latino that because current genetic testing is not perfect, it is possible there may be a gene mutation in one of these genes that current testing cannot detect, but that chance is small.  We also discussed, that there could be another gene that has not yet been discovered, or that we have not yet tested, that is responsible for the cancer diagnoses in the family. It is also possible there is a hereditary cause for the cancer in the family that Ms. Mccullers did not inherit and therefore was not identified in her testing.  Therefore, it is important to remain in touch with cancer genetics in the future so that we can continue to offer Ms. Jakel the most up to date genetic testing.   Genetic testing did identify a variant of uncertain significance (VUS)  was identified in the MSH2 gene called c.114C>G.  At this time, it is unknown if this variant is associated with increased cancer risk or if this is a normal finding, but most variants such as this get reclassified to being inconsequential. It should not be used to make medical management decisions. With time, we suspect the lab will determine the significance of this variant, if any. If we do learn more about it, we will try to contact Ms. Privette to discuss it further. However, it is important to stay in touch with [name] periodically and keep the address and phone number up to date.  ADDITIONAL GENETIC TESTING: We discussed with Ms. Meneely that her genetic testing was fairly extensive.  If there are genes identified to increase cancer risk that can be analyzed in the future,  we would be happy to discuss and coordinate this testing at that time.    CANCER SCREENING RECOMMENDATIONS: Ms. Freyman test result is considered negative (normal).  This means that we have not identified a hereditary cause for her personal and family history of cancer at this time. Most cancers happen by chance and this negative test suggests that her personal and family history of cancer may fall into this category.    Possible reasons for Ms. Vasallo's negative genetic test include:  1. There may be a gene mutation in one of these genes that current testing methods cannot detect but that chance is small.  2. There could be another gene that has not yet been discovered, or that we have not yet tested, that is responsible for the cancer diagnoses in the family.  3.  There may be no hereditary risk for cancer in the family. The cancers in Ms. Kassabian and/or her family may be sporadic/familial or due to other genetic and environmental factors. 4. It is also possible there is a hereditary cause for the cancer in the family that Ms. Gwyn did not inherit.  Therefore, it is recommended she continue to follow the cancer management and screening guidelines provided by her oncology and primary healthcare provider. An individual's cancer risk and medical management are not determined by genetic test results alone. Overall cancer risk assessment incorporates additional factors, including personal medical history, family history, and any available genetic information that may result in a personalized plan for cancer prevention and surveillance  RECOMMENDATIONS FOR FAMILY MEMBERS:  Individuals in this family might be at some increased risk of developing cancer, over the general population risk, simply due to the family history of cancer.  We recommended women in this family have a yearly mammogram beginning at age 63, or 56 years younger than the earliest onset of cancer, an annual clinical breast exam, and perform  monthly breast self-exams. Women in this family should also have a gynecological exam as recommended by their primary provider. All family members should be referred for colonoscopy starting at age 56.  FOLLOW-UP: Lastly, we discussed with Ms. Ascencio that cancer genetics is a rapidly advancing field and it is possible that new genetic tests will be appropriate for her and/or her family members in the future. We encouraged her to remain in contact with cancer genetics on an annual basis so we can update her personal and family histories and let her know of advances in cancer genetics that may benefit this family.   Our contact number was provided. Ms. Ririe questions were answered to her satisfaction, and she knows she is welcome to call us at anytime with additional questions or  concerns.   Maylon Cos, MS, [facility] Licensed, Certified Genetic Counselor [email or web address]

## 2022-11-02 NOTE — Discharge Instructions (Signed)

## 2022-11-02 NOTE — Telephone Encounter (Signed)
Revealed negative genetic testing.  Discussed that we do not know why she has uterine cancer or why there is cancer in the family. It could be due to a different gene that we are not testing, or maybe our current technology may not be able to pick something up.  It will be important for her to keep in contact with genetics to keep up with whether additional testing may be needed.   One VUS identified but this will not change medical management.

## 2022-11-02 NOTE — ED Triage Notes (Signed)
Pt reports she has been vomiting for 2 days after eating at a church dinner. No diarrhea. States she vomits after eating
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
## 2022-11-03 ENCOUNTER — Other Ambulatory Visit: Payer: Self-pay | Admitting: Family Medicine

## 2022-11-03 DIAGNOSIS — E1169 Type 2 diabetes mellitus with other specified complication: Secondary | ICD-10-CM

## 2022-11-03 LAB — COMPREHENSIVE METABOLIC PANEL
ALT: 52 [IU]/L — ABNORMAL HIGH (ref 0–32)
AST: 29 [IU]/L (ref 0–40)
Albumin: 4.2 g/dL (ref 3.8–4.9)
Alkaline Phosphatase: 259 [IU]/L — ABNORMAL HIGH (ref 44–121)
BUN/Creatinine Ratio: 18 (ref 9–23)
BUN: 14 mg/dL (ref 6–24)
Bilirubin Total: 0.2 mg/dL (ref 0.0–1.2)
CO2: 24 mmol/L (ref 20–29)
Calcium: 9.6 mg/dL (ref 8.7–10.2)
Chloride: 94 mmol/L — ABNORMAL LOW (ref 96–106)
Creatinine, Ser: 0.8 mg/dL (ref 0.57–1.00)
Globulin, Total: 3.1 g/dL (ref 1.5–4.5)
Glucose: 272 mg/dL — ABNORMAL HIGH (ref 70–99)
Potassium: 4.3 mmol/L (ref 3.5–5.2)
Sodium: 134 mmol/L (ref 134–144)
Total Protein: 7.3 g/dL (ref 6.0–8.5)
eGFR: 85 mL/min/{1.73_m2} (ref >59)

## 2022-11-03 LAB — CBC WITH DIFFERENTIAL/PLATELET
Basophils Absolute: 0 10*3/uL (ref 0.0–0.2)
Basos: 0 %
EOS (ABSOLUTE): 0.2 10*3/uL (ref 0.0–0.4)
Eos: 2 %
Hematocrit: 41.2 % (ref 34.0–46.6)
Hemoglobin: 13 g/dL (ref 11.1–15.9)
Immature Grans (Abs): 0 10*3/uL (ref 0.0–0.1)
Immature Granulocytes: 0 %
Lymphocytes Absolute: 2.9 10*3/uL (ref 0.7–3.1)
Lymphs: 37 %
MCH: 27.3 pg (ref 26.6–33.0)
MCHC: 31.6 g/dL (ref 31.5–35.7)
MCV: 87 fL (ref 79–97)
Monocytes Absolute: 0.7 10*3/uL (ref 0.1–0.9)
Monocytes: 9 %
Neutrophils Absolute: 4 10*3/uL (ref 1.4–7.0)
Neutrophils: 52 %
Platelets: 287 10*3/uL (ref 150–450)
RBC: 4.76 x10E6/uL (ref 3.77–5.28)
RDW: 12.9 % (ref 11.7–15.4)
WBC: 7.9 10*3/uL (ref 3.4–10.8)

## 2022-11-05 ENCOUNTER — Encounter (HOSPITAL_COMMUNITY): Payer: Self-pay | Admitting: Pharmacy Technician

## 2022-11-05 ENCOUNTER — Emergency Department (HOSPITAL_COMMUNITY)
Admission: EM | Admit: 2022-11-05 | Discharge: 2022-11-05 | Disposition: A | Discharge status: Home / Self Care | Payer: MEDICAID | Attending: Emergency Medicine | Admitting: Emergency Medicine

## 2022-11-05 ENCOUNTER — Other Ambulatory Visit: Payer: Self-pay

## 2022-11-05 ENCOUNTER — Emergency Department (HOSPITAL_COMMUNITY): Payer: MEDICAID

## 2022-11-05 DIAGNOSIS — I10 Essential (primary) hypertension: Secondary | ICD-10-CM | POA: Insufficient documentation

## 2022-11-05 DIAGNOSIS — E1140 Type 2 diabetes mellitus with diabetic neuropathy, unspecified: Secondary | ICD-10-CM | POA: Diagnosis not present

## 2022-11-05 DIAGNOSIS — Z79899 Other long term (current) drug therapy: Secondary | ICD-10-CM | POA: Diagnosis not present

## 2022-11-05 DIAGNOSIS — Z7984 Long term (current) use of oral hypoglycemic drugs: Secondary | ICD-10-CM | POA: Diagnosis not present

## 2022-11-05 DIAGNOSIS — Z7982 Long term (current) use of aspirin: Secondary | ICD-10-CM | POA: Diagnosis not present

## 2022-11-05 DIAGNOSIS — Z794 Long term (current) use of insulin: Secondary | ICD-10-CM | POA: Insufficient documentation

## 2022-11-05 DIAGNOSIS — J069 Acute upper respiratory infection, unspecified: Secondary | ICD-10-CM | POA: Insufficient documentation

## 2022-11-05 DIAGNOSIS — Z20822 Contact with and (suspected) exposure to covid-19: Secondary | ICD-10-CM | POA: Diagnosis not present

## 2022-11-05 DIAGNOSIS — R059 Cough, unspecified: Secondary | ICD-10-CM | POA: Diagnosis present

## 2022-11-05 LAB — URINALYSIS, ROUTINE W REFLEX MICROSCOPIC
Bacteria, UA: NONE SEEN
Bilirubin Urine: NEGATIVE
Glucose, UA: >=500 mg/dL — AB
Hgb urine dipstick: NEGATIVE
Ketones, ur: NEGATIVE mg/dL
Leukocytes,Ua: NEGATIVE
Nitrite: NEGATIVE
Protein, ur: NEGATIVE mg/dL
Specific Gravity, Urine: 1.011 (ref 1.005–1.030)
pH: 6 (ref 5.0–8.0)

## 2022-11-05 LAB — CBC WITH DIFFERENTIAL/PLATELET
Abs Immature Granulocytes: 0.02 10*3/uL (ref 0.00–0.07)
Basophils Absolute: 0 10*3/uL (ref 0.0–0.1)
Basophils Relative: 1 %
Eosinophils Absolute: 0.2 10*3/uL (ref 0.0–0.5)
Eosinophils Relative: 3 %
HCT: 38.7 % (ref 36.0–46.0)
Hemoglobin: 12.2 g/dL (ref 12.0–15.0)
Immature Granulocytes: 0 %
Lymphocytes Relative: 37 %
Lymphs Abs: 2.7 10*3/uL (ref 0.7–4.0)
MCH: 27.1 pg (ref 26.0–34.0)
MCHC: 31.5 g/dL (ref 30.0–36.0)
MCV: 86 fL (ref 80.0–100.0)
Monocytes Absolute: 0.7 10*3/uL (ref 0.1–1.0)
Monocytes Relative: 10 %
Neutro Abs: 3.5 10*3/uL (ref 1.7–7.7)
Neutrophils Relative %: 49 %
Platelets: 247 10*3/uL (ref 150–400)
RBC: 4.5 MIL/uL (ref 3.87–5.11)
RDW: 13.6 % (ref 11.5–15.5)
WBC: 7.2 10*3/uL (ref 4.0–10.5)
nRBC: 0 % (ref 0.0–0.2)

## 2022-11-05 LAB — COMPREHENSIVE METABOLIC PANEL
ALT: 42 U/L (ref 0–44)
AST: 23 U/L (ref 15–41)
Albumin: 3.3 g/dL — ABNORMAL LOW (ref 3.5–5.0)
Alkaline Phosphatase: 180 U/L — ABNORMAL HIGH (ref 38–126)
Anion gap: 9 (ref 5–15)
BUN: 8 mg/dL (ref 6–20)
CO2: 27 mmol/L (ref 22–32)
Calcium: 9.2 mg/dL (ref 8.9–10.3)
Chloride: 98 mmol/L (ref 98–111)
Creatinine, Ser: 0.78 mg/dL (ref 0.44–1.00)
GFR, Estimated: >60 mL/min (ref >60)
Glucose, Bld: 293 mg/dL — ABNORMAL HIGH (ref 70–99)
Potassium: 4 mmol/L (ref 3.5–5.1)
Sodium: 134 mmol/L — ABNORMAL LOW (ref 135–145)
Total Bilirubin: 0.3 mg/dL (ref 0.3–1.2)
Total Protein: 7.2 g/dL (ref 6.5–8.1)

## 2022-11-05 LAB — RESP PANEL BY RT-PCR (RSV, FLU A&B, COVID)  RVPGX2
Influenza A by PCR: NEGATIVE
Influenza B by PCR: NEGATIVE
Resp Syncytial Virus by PCR: NEGATIVE
SARS Coronavirus 2 by RT PCR: NEGATIVE

## 2022-11-05 LAB — LIPASE, BLOOD: Lipase: 22 U/L (ref 11–51)

## 2022-11-05 MED ORDER — ONDANSETRON 4 MG PO TBDP: 4.0000 mg | ORAL_TABLET | Freq: Once | ORAL | Status: DC

## 2022-11-05 MED ORDER — BENZONATATE 100 MG PO CAPS: 100.0000 mg | ORAL_CAPSULE | Freq: Three times a day (TID) | ORAL | 0 refills | Status: DC

## 2022-11-05 MED ORDER — IPRATROPIUM-ALBUTEROL 0.5-2.5 (3) MG/3ML IN SOLN
3.0000 mL | Freq: Once | RESPIRATORY_TRACT | Status: AC
  Administered 2022-11-05: 3 mL via RESPIRATORY_TRACT
  Filled 2022-11-05: qty 3

## 2022-11-05 MED ORDER — ONDANSETRON HCL 4 MG PO TABS: 4.0000 mg | ORAL_TABLET | Freq: Four times a day (QID) | ORAL | 0 refills | Status: DC

## 2022-11-05 NOTE — ED Triage Notes (Signed)
Pt here with complaints of cough, congestion, emesis, and back pain. Seen at UC several days ago for same.

## 2022-11-05 NOTE — ED Notes (Signed)
Patient transported to X-ray 

## 2022-11-05 NOTE — ED Provider Notes (Signed)
[place] [facility]GENC[facility] DEPARTMENT AT [facility] Provider Note   CSN: [id number] Arrival date & time: 11/05/22  1325     History  Chief Complaint  Patient presents with   Nasal Congestion   Cough   Back Pain    Tara Keller is a 57 y.o. female history of GERD, diabetic neuropathy, diabetes, hypertension presented for cough congestion emesis and back pain for the past 2 weeks.  Patient went to urgent care few days ago and states that not much happened after this however notes that her chest is starting to hurt from all the coughing.  Patient states she is having up clear sputum and denies hematemesis or colored sputum.  Patient denies any fevers but notes that she is having watery emesis but able to eat and drink.  Patient states that a month ago she started taking glipizide for her diabetes however states that she was gaining weight and so she stopped and is concerned that her diabetes may be out of control.  Patient has a butyryl inhaler at home however has not been using as she states she has not needed it.  Patient denies shortness of breath, sick contacts, dysuria, hematuria, flank pain, back pain  Home Medications Prior to Admission medications   Medication Sig Start Date End Date Taking? Authorizing Provider  Accu-Chek FastClix Lancets MISC USE TO TEST THREE TIMES DAILY 06/28/22   Georganna Skeans, MD  albuterol (VENTOLIN HFA) 108 (90 Base) MCG/ACT inhaler Inhale 1-2 puffs into the lungs every 6 (six) hours as needed for wheezing or shortness of breath.    [provider]  aspirin EC 81 MG tablet Take 1 tablet (81 mg total) by mouth daily. 01/11/22   Cross, Efraim Kaufmann D, NP  atorvastatin (LIPITOR) 40 MG tablet Take 1 tablet (40 mg total) by mouth daily. 03/08/22   Shamleffer, Konrad Dolores, MD  Blood Glucose Monitoring Suppl (ACCU-CHEK GUIDE ME) w/Device KIT 48 Units by Does not apply route 2 (two) times daily. 02/06/18   Hoy Register, MD  buPROPion (WELLBUTRIN XL)  300 MG 24 hr tablet Take 1 tablet (300 mg total) by mouth daily. 07/19/21   Georganna Skeans, MD  busPIRone (BUSPAR) 7.5 MG tablet Take 7.5 mg by mouth 2 (two) times daily. 10/25/21   [provider]  cetirizine (ZYRTEC) 10 MG tablet Take 1 tablet (10 mg total) by mouth daily. 07/27/20   Belva Agee, MD  Cholecalciferol (VITAMIN D) 50 MCG (2000 UT) tablet Take 2,000 Units by mouth daily.    [provider]  clonazePAM (KLONOPIN) 0.5 MG tablet Take 1 tablet (0.5 mg total) by mouth 2 (two) times daily as needed for anxiety. 09/08/21   Georganna Skeans, MD  Continuous Blood Gluc Sensor (DEXCOM G7 SENSOR) MISC 1 Device by Does not apply route as directed. 03/08/22   Shamleffer, Konrad Dolores, MD  cyclobenzaprine (FLEXERIL) 5 MG tablet Take 1 tablet (5 mg total) by mouth at bedtime. 04/25/22   Nita Sickle K, DO  dapagliflozin propanediol (FARXIGA) 10 MG TABS tablet Take 1 tablet (10 mg total) by mouth daily. 09/15/22   Shamleffer, Konrad Dolores, MD  diazepam (VALIUM) 5 MG tablet Take 1 tablet 30-min prior to MRI. 05/15/22   Nita Sickle K, DO  diclofenac Sodium (VOLTAREN) 1 % GEL Apply 4 g topically 4 (four) times daily. Patient taking differently: Apply 4 g topically daily as needed (Pain). 10/22/19   Dellia Cloud, MD  DULoxetine (CYMBALTA) 20 MG capsule Take 20 mg by mouth  daily. 09/18/22   [provider]  DULoxetine (CYMBALTA) 60 MG capsule Take 1 capsule (60 mg total) by mouth daily. 07/19/21   Georganna Skeans, MD  fluticasone [facility]) 50 MCG/ACT nasal spray Place 1 spray into both nostrils daily as needed for rhinitis. 01/23/18 07/21/27  [provider]  gabapentin (NEURONTIN) 300 MG capsule Take 1 capsule (300 mg total) by mouth 3 (three) times daily. 06/28/22   Georganna Skeans, MD  glipiZIDE (GLUCOTROL XL) 5 MG 24 hr tablet Take 1 tablet (5 mg total) by mouth daily with breakfast. 09/15/22   Shamleffer, Konrad Dolores, MD  glucose blood (ACCU-CHEK GUIDE) test strip  Three times a day 06/28/22   Georganna Skeans, MD  hydrOXYzine (VISTARIL) 25 MG capsule Take 1 capsule (25 mg total) by mouth 3 (three) times daily as needed. 07/19/21   Georganna Skeans, MD  insulin aspart (NOVOLOG FLEXPEN) 100 UNIT/ML FlexPen Max daily 80 units 09/15/22   Shamleffer, Konrad Dolores, MD  insulin glargine (LANTUS SOLOSTAR) 100 UNIT/ML Solostar Pen Inject 80 Units into the skin daily. 09/15/22   Shamleffer, Konrad Dolores, MD  Insulin Pen Needle 31G X 5 MM MISC 1 Device by Does not apply route in the morning, at noon, in the evening, and at bedtime. 09/15/22   Shamleffer, Konrad Dolores, MD  lisinopril (ZESTRIL) 10 MG tablet TAKE 1 TABLET(10 MG) BY MOUTH DAILY 06/28/22   Georganna Skeans, MD  Melatonin ER 5 MG TBCR Take 5 mg by mouth at bedtime.    [provider]  meloxicam (MOBIC) 7.5 MG tablet TAKE 1 TABLET(7.5 MG) BY MOUTH DAILY FOR UP TO 90 DOSES AS NEEDED FOR PAIN 10/09/22   Georganna Skeans, MD  metFORMIN (GLUCOPHAGE-XR) 750 MG 24 hr tablet Take 1 tablet (750 mg total) by mouth in the morning and at bedtime. 09/15/22   Shamleffer, Konrad Dolores, MD  methocarbamol (ROBAXIN) 500 MG tablet Take 1 tablet (500 mg total) by mouth every 6 (six) hours as needed for muscle spasms. 06/28/22   Georganna Skeans, MD  ondansetron (ZOFRAN-ODT) 4 MG disintegrating tablet Take 1 tablet (4 mg total) by mouth every 8 (eight) hours as needed for nausea or vomiting. 11/02/22   Mardella Layman, MD  pantoprazole (PROTONIX) 40 MG tablet Take 1 tablet (40 mg total) by mouth 2 (two) times daily. 06/28/22   Georganna Skeans, MD  phentermine (ADIPEX-P) 37.5 MG tablet Take 1 tablet (37.5 mg total) by mouth daily before breakfast. 06/28/22   Georganna Skeans, MD  polyethylene glycol powder (GLYCOLAX/MIRALAX) 17 GM/SCOOP powder Take 17 g by mouth daily as needed for constipation. 07/05/20   [provider]  potassium chloride SA (KLOR-CON) 20 MEQ tablet Take 1 tablet (20 mEq total) by mouth daily. 09/23/20   Shamleffer,  Konrad Dolores, MD  propranolol (INDERAL) 10 MG tablet Take 1 tablet (10 mg total) by mouth 2 (two) times daily. TAKE 1 TABLET(10 MG) BY MOUTH TWICE DAILY AS NEEDED Strength: 10 mg 06/28/22   Georganna Skeans, MD  sucralfate (CARAFATE) 1 g tablet TAKE 1 TABLET(1 GRAM) BY MOUTH FOUR TIMES DAILY BEFORE MEALS AND AT BEDTIME 09/01/20   Rehman, Areeg N, DO  SURE COMFORT INSULIN SYRINGE 31G X 5/16" 1 ML MISC Use to inject insulin daily 03/04/20   Shamleffer, Konrad Dolores, MD  tamsulosin (FLOMAX) 0.4 MG CAPS capsule Take 0.4 mg by mouth daily as needed (kidney stones).    [provider]  tiZANidine (ZANAFLEX) 4 MG tablet Take 1 tablet (4 mg total) by mouth every 8 (  eight) hours as needed for muscle spasms. 09/23/21   Domenick Gong, MD      Allergies    Benadryl [diphenhydramine]    Review of Systems   Review of Systems  Respiratory:  Positive for cough.   Musculoskeletal:  Positive for back pain.    Physical Exam Updated Vital Signs BP 128/82   Pulse 83   Temp 98.4 F (36.9 C) (Oral)   Resp 16   LMP 01/23/2014 (Approximate)   SpO2 100%  Physical Exam Vitals reviewed.  Constitutional:      General: She is not in acute distress. HENT:     Head: Normocephalic and atraumatic.     Jaw: There is normal jaw occlusion.     Salivary Glands: Right salivary gland is not diffusely enlarged or tender. Left salivary gland is not diffusely enlarged or tender.     Comments: No facial swelling    Right Ear: Hearing, tympanic membrane, ear canal and external ear normal.     Left Ear: Hearing, tympanic membrane, ear canal and external ear normal.     Nose: Nose normal.     Mouth/Throat:     Lips: Pink.     Mouth: Mucous membranes are moist.     Pharynx: Oropharynx is clear. Uvula midline.     Comments: No oral floor swelling Tolerating secretions No purulent drainage no No PTA noted No muffled voice noted Eyes:     Extraocular Movements: Extraocular movements intact.      Conjunctiva/sclera: Conjunctivae normal.     Pupils: Pupils are equal, round, and reactive to light.  Neck:     Comments: No neck swelling Cardiovascular:     Rate and Rhythm: Normal rate and regular rhythm.     Pulses: Normal pulses.     Heart sounds: Normal heart sounds.     Comments: 2+ bilateral radial/dorsalis pedis pulses with regular rate Pulmonary:     Effort: Pulmonary effort is normal. No respiratory distress.     Breath sounds: Wheezing (Bilaterally) present.     Comments: Nonproductive cough in the room Abdominal:     Palpations: Abdomen is soft.     Tenderness: There is no abdominal tenderness. There is no guarding or rebound.  Musculoskeletal:        General: Normal range of motion.     Cervical back: Normal range of motion and neck supple. No rigidity or tenderness.     Right lower leg: No edema.     Left lower leg: No edema.     Comments: 5 out of 5 bilateral grip/leg extension strength  Skin:    General: Skin is warm and dry.     Capillary Refill: Capillary refill takes less than 2 seconds.  Neurological:     General: No focal deficit present.     Mental Status: She is alert and oriented to person, place, and time.     Comments: Sensation intact in all 4 limbs  Psychiatric:        Mood and Affect: Mood normal.     ED Results / Procedures / Treatments   Labs (all labs ordered are listed, but only abnormal results are displayed) Labs Reviewed  COMPREHENSIVE METABOLIC PANEL - Abnormal; Notable for the following components:      Result Value   Sodium 134 (*)    Glucose, Bld 293 (*)    Albumin 3.3 (*)    Alkaline Phosphatase 180 (*)    All other components within normal limits  URINALYSIS,  ROUTINE W REFLEX MICROSCOPIC - Abnormal; Notable for the following components:   Glucose, UA >=500 (*)    All other components within normal limits  RESP PANEL BY RT-PCR (RSV, FLU A&B, COVID)  [id number]  CBC WITH DIFFERENTIAL/PLATELET  LIPASE, BLOOD     EKG None  Radiology DG Chest 2 View  Result Date: 11/05/2022 CLINICAL DATA:  Cough, congestion, emesis, back pain EXAM: CHEST - 2 VIEW COMPARISON:  10/17/2021 FINDINGS: Frontal and lateral views of the chest demonstrate an unremarkable cardiac silhouette. No airspace disease, effusion, or pneumothorax. No acute bony abnormalities. IMPRESSION: 1. No acute intrathoracic process. Electronically Signed   By: Sharlet Salina M.D.   On: 11/05/2022 14:58    Procedures Procedures    Medications Ordered in ED Medications  ipratropium-albuterol (DUONEB) 0.5-2.5 (3) MG/3ML nebulizer solution 3 mL (3 mLs Nebulization Given 11/05/22 1454)    ED Course/ Medical Decision Making/ A&P                                 Medical Decision Making Amount and/or Complexity of Data Reviewed Radiology: ordered.   Fritzi Mandes Mangan 57 y.o. presented today for URI like symptoms. Working DDx that I considered at this time includes, but not limited to, viral illness, pneumonia, bronchitis, electrolyte abnormalities, pneumonia, DKA.  R/o DDx: pneumonia, bronchitis, electrolyte abnormalities, pneumonia, DKA, AOM, sinusitis, tonsillitis, strep: Considered however not applicable patient's physical exam, history, lab/imaging findings  Review of prior external notes: 11/02/2022 ED  Unique Tests and My Interpretation:  CBC: Unremarkable Respiratory panel:Unremarkable ZOX:[id number] 293, baseline JY:[id number] Chest x-ray:Unremarkable  Social Determinants of Health: none  Discussion with Independent Historian: None  Discussion of Management of Tests: None  Risk: Medium: prescription drug management  Risk Stratification Score: None  Plan: On exam patient was in no acute distress with stable vitals.  Patient's exam does show wheezing so we will give breathing treatment.  Patient states she has not been using her albuterol inhaler at home as she has not needed it.  Will obtain labs in triage and add  on lipase due to patient's reported emesis.  Currently awaiting on chest x-ray as well.  If everything is negative suspect patient may have bronchitis and may need Tessalon as she is not currently taking any cough medicine and she can also take Mucinex as well.  Patient's labs were all reassuring.  Patient's glucose is 293 however this appears to be at baseline and no signs of DKA as there is no anion gap.  I reassessment patient did have mild right upper quadrant tenderness but states she does not have a gallbladder.  I spoke to the patient about the possibility of doing a CT scan as patient is high risk with nausea vomiting with abdominal tenderness however patient declined at this time as she states she does not need a CT scan.  Patient understands limits evaluation may lead to missed diagnosis and verbalized understand acceptance of this.  Patient is a full decision-making capacity.  Patient states that with the DuoNeb treatment she is feeling much better and so there may be an underlying component of asthma exacerbation and so I recommended she uses her albuterol inhaler at home and follows up with her primary care provider.  Due to patient's uncontrolled diabetes will withhold steroids at this time for risk of making the sugar higher.  Patient and I talked about this and she agrees with this plan.  Patient  was p.o. challenged and successfully was able to keep down water and states she is ready to be discharged.  Encourage patient to follow-up with primary care provider as she will need to speak with them about the glipizide as patient states she has been having issues with the glipizide does not want to take it due to the weight gain which I spoke to her is a side effect of the medication.  Patient was given return precautions.patient stable for discharge at this time.  Patient verbalized understanding of plan.  This chart was dictated using voice recognition software.  Despite best efforts to proofread,   errors can occur which can change the documentation meaning.         Final Clinical Impression(s) / ED Diagnoses Final diagnoses:  Upper respiratory tract infection, unspecified type    Rx / DC Orders ED Discharge Orders     None         Remi Deter 11/05/22 1524    Wynetta Fines, MD 11/05/22 1704

## 2022-11-05 NOTE — Discharge Instructions (Addendum)
Please follow-up with your primary care provider in regards to recent ER visit.  Today your labs and imaging were all reassuring and as we discussed you declined the CT scan at this time.  Please use the Zofran I have prescribed for you and the Tessalon for your cough.  You may also use Mucinex over-the-counter to help with the congestion.  Please discuss with your primary care provider about the glipizide as you may need a different medication for your diabetes.  If symptoms change or worsen please return to ER.

## 2022-11-05 NOTE — ED Provider Triage Note (Signed)
Emergency Medicine Provider Triage Evaluation Note  Tara Keller , a 57 y.o. female  was evaluated in triage.  Pt complains of productive cough, thoracic back pain, fatigue.  Review of Systems  Positive: As above Negative: Fever,   Physical Exam  LMP 01/23/2014 (Approximate)  Gen:   Awake, no distress   Resp:  Normal effort  MSK:   Moves extremities without difficulty  Other:    Medical Decision Making  Medically screening exam initiated at 1:47 PM.  Appropriate orders placed.  Richard Keenen Rynders was informed that the remainder of the evaluation will be completed by another provider, this initial triage assessment does not replace that evaluation, and the importance of remaining in the ED until their evaluation is complete.  Patient with morbid obesity, history of DM, recently started on Glipizide in September. Since then she reports weight gain, development of cough, fatigue, generalized weakness, infrequent vomiting. Seen 2 days ago at Urgent Care and told to come to ED if symptoms worsen.    Elpidio Anis, PA-C 11/05/22 1349

## 2022-11-08 DIAGNOSIS — Z0289 Encounter for other administrative examinations: Secondary | ICD-10-CM

## 2022-11-13 ENCOUNTER — Other Ambulatory Visit: Payer: Self-pay | Admitting: Family Medicine

## 2022-11-15 ENCOUNTER — Ambulatory Visit (INDEPENDENT_AMBULATORY_CARE_PROVIDER_SITE_OTHER): Payer: MEDICAID | Admitting: Family Medicine

## 2022-11-15 VITALS — BP 116/69 | HR 76 | Temp 98.1°F | Resp 16 | Wt 314.8 lb

## 2022-11-15 DIAGNOSIS — Z794 Long term (current) use of insulin: Secondary | ICD-10-CM

## 2022-11-15 DIAGNOSIS — E1169 Type 2 diabetes mellitus with other specified complication: Secondary | ICD-10-CM | POA: Diagnosis not present

## 2022-11-15 DIAGNOSIS — Z7984 Long term (current) use of oral hypoglycemic drugs: Secondary | ICD-10-CM

## 2022-11-15 DIAGNOSIS — Z6841 Body Mass Index (BMI) 40.0 and over, adult: Secondary | ICD-10-CM

## 2022-11-15 DIAGNOSIS — E119 Type 2 diabetes mellitus without complications: Secondary | ICD-10-CM

## 2022-11-15 DIAGNOSIS — I10 Essential (primary) hypertension: Secondary | ICD-10-CM

## 2022-11-15 MED ORDER — LISINOPRIL 10 MG PO TABS: ORAL_TABLET | ORAL | 1 refills | Status: DC

## 2022-11-16 ENCOUNTER — Encounter: Payer: Self-pay | Admitting: Family Medicine
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
## 2022-11-22 ENCOUNTER — Ambulatory Visit (INDEPENDENT_AMBULATORY_CARE_PROVIDER_SITE_OTHER): Payer: MEDICAID | Admitting: Family Medicine

## 2022-11-22 ENCOUNTER — Encounter (INDEPENDENT_AMBULATORY_CARE_PROVIDER_SITE_OTHER): Payer: Self-pay | Admitting: Family Medicine

## 2022-11-22 VITALS — BP 124/71 | HR 90 | Temp 98.1°F | Ht 66.0 in | Wt 311.0 lb

## 2022-11-22 DIAGNOSIS — Z6841 Body Mass Index (BMI) 40.0 and over, adult: Secondary | ICD-10-CM

## 2022-11-22 DIAGNOSIS — G4733 Obstructive sleep apnea (adult) (pediatric): Secondary | ICD-10-CM

## 2022-11-22 DIAGNOSIS — E785 Hyperlipidemia, unspecified: Secondary | ICD-10-CM | POA: Diagnosis not present

## 2022-11-22 DIAGNOSIS — R0602 Shortness of breath: Secondary | ICD-10-CM

## 2022-11-22 DIAGNOSIS — E1169 Type 2 diabetes mellitus with other specified complication: Secondary | ICD-10-CM | POA: Insufficient documentation

## 2022-11-22 DIAGNOSIS — F321 Major depressive disorder, single episode, moderate: Secondary | ICD-10-CM

## 2022-11-22 DIAGNOSIS — R5383 Other fatigue: Secondary | ICD-10-CM | POA: Diagnosis not present

## 2022-11-22 DIAGNOSIS — Z794 Long term (current) use of insulin: Secondary | ICD-10-CM

## 2022-11-22 DIAGNOSIS — E559 Vitamin D deficiency, unspecified: Secondary | ICD-10-CM | POA: Insufficient documentation

## 2022-11-22 MED ORDER — ATORVASTATIN CALCIUM 40 MG PO TABS: 40.0000 mg | ORAL_TABLET | Freq: Every day | ORAL | 3 refills | Status: DC

## 2022-11-22 NOTE — Assessment & Plan Note (Signed)
On OTC Vit D.  Patient has significant fatigue.

## 2022-11-22 NOTE — Assessment & Plan Note (Signed)
Patient sees Dr. Lonzo Cloud.  She is only on novolog and lantus.  Her last A1c was over 9.  She has a history of pancreatitis so she has not been given GLP1.  She is not sure why she is getting pancreatitis. She is doing a sliding scale of novolog and 80 units of lantus daily. She never has low blood sugars.  She has dexcom but the pharmacy did not fill her supplies in the last few months.

## 2022-11-22 NOTE — Progress Notes (Signed)
Chief Complaint:  Obesity   Subjective:  Tara Keller (MR# [id number]) is a 57 y.o. female who presents for evaluation and treatment of obesity and related comorbidities. She heard about her clinic from her PCP.  She is tearful today- states that her mother and sister died within the last few years.  She feels very alone.   Her support system is her fiance, her daughter and her grandkids.    Tara Keller is currently in the action stage of change and ready to dedicate time achieving and maintaining a healthier weight. Tara Keller is interested in becoming our patient and working on intensive lifestyle modifications including (but not limited to) diet and exercise for weight loss.  Lives with her friend Tara Keller.  She doesn't eat out and so she cooks at home.   Tara Keller has been struggling with her weight. She has been unsuccessful in either losing weight, maintaining weight loss, or reaching her healthy weight goal.  Tara Keller habits were reviewed today and are as follows: Her family eats meals together, she thinks her family will eat healthier with her, her desired weight loss is 140lbs, she is a picky eater and doesn't like to eat healthier foods, she skips meals frequently, and she struggles with emotional eating.  She tends to snack on higher carbohyrdate foods like candy, chips, cookies.   Food recall: Breakfast in the am with 3 eggs, 4 sausages, grits (1 cup) and toast (2 slices)- feels satisfied.  Depends on how she feels will depends on when she eats again.  3pm she may eat again like chips and a couple of cookies. Eats out of a big bag of chips.  At 8:30/9pm she will again peanut butter sandwich with jelly or PB and crackers. May eat a bowl of cereal or two of Honey nut cheerios (2 cups per bowl).    Indirect Calorimeter completed today shows a RMR: 2794 . Her calculated basal metabolic rate is 6962 thus her basal metabolic rate is better than expected.  Other Fatigue Tara Keller admits to daytime somnolence and  admits to waking up still tired. Patient has a history of symptoms of daytime fatigue. Tamkia generally gets 7 or 8 hours of sleep per night, and states that she has difficulty falling asleep and nightime awakenings. Snoring is present. Apneic episodes is present. Epworth Sleepiness Score is 14.   Shortness of Breath Tara Keller notes increasing shortness of breath with exercising and seems to be worsening over time with weight gain. She notes getting out of breath sooner with activity than she used to. This has gotten worse recently. Tara Keller denies shortness of breath at rest or orthopnea.  Depression Screen Tara Keller's Food and Mood (modified PHQ-9) score was 16.     11/15/2022    2:46 PM  Depression screen PHQ 2/9  Decreased Interest 0  Down, Depressed, Hopeless 0  PHQ - 2 Score 0  Altered sleeping 0  Tired, decreased energy 1  Change in appetite 0  Feeling bad or failure about yourself  0  Trouble concentrating 0  Moving slowly or fidgety/restless 0  Suicidal thoughts 0  PHQ-9 Score 1  Difficult doing work/chores Not difficult at all     Objective:  Vitals Temp: 98.1 F (36.7 C) BP: 124/71 Pulse Rate: 90 SpO2: 95 %   Anthropometric Measurements Height: 5\' 6"  (1.676 m) Weight: (!) 311 lb (141.1 kg) BMI (Calculated): 50.22 Starting Weight: 311 lb Peak Weight: 450 lb Waist Measurement : 64 inches   Body Composition  Body Fat %:  54.5 % Fat Mass (lbs): 169.8 lbs Muscle Mass (lbs): 134.6 lbs Total Body Water (lbs): 97.4 lbs Visceral Fat Rating : 21   Other Clinical Data RMR: 2794 Fasting: yes Labs: yes Today's Visit #: 1 Starting Date: 11/22/22    EKG: Normal sinus rhythm- EKG done on December 2023 reviewed and does have signs of hypertrophy.   General: Cooperative, alert, well developed, in no acute distress. HEENT: Conjunctivae and lids unremarkable. Cardiovascular: Regular rhythm.  Lungs: Normal work of breathing. Neurologic: No focal deficits.   Lab Results   Component Value Date   CREATININE 0.78 11/05/2022   BUN 8 11/05/2022   NA 134 (L) 11/05/2022   K 4.0 11/05/2022   CL 98 11/05/2022   CO2 27 11/05/2022   Lab Results  Component Value Date   ALT 42 11/05/2022   AST 23 11/05/2022   GGT 248 (H) 04/06/2020   ALKPHOS 180 (H) 11/05/2022   BILITOT 0.3 11/05/2022   Lab Results  Component Value Date   HGBA1C 9.2 (A) 09/15/2022   HGBA1C 9.0 (A) 06/28/2022   HGBA1C 9.2 (A) 03/08/2022   HGBA1C 8.7 (H) 12/28/2021   HGBA1C 8.5 (A) 11/07/2021   No results found for: "INSULIN" Lab Results  Component Value Date   TSH 1.430 04/06/2020   Lab Results  Component Value Date   CHOL 158 11/07/2021   HDL 42.70 11/07/2021   LDLCALC 83 11/07/2021   TRIG 159.0 (H) 11/07/2021   CHOLHDL 4 11/07/2021   Lab Results  Component Value Date   WBC 7.2 11/05/2022   HGB 12.2 11/05/2022   HCT 38.7 11/05/2022   MCV 86.0 11/05/2022   PLT 247 11/05/2022   Lab Results  Component Value Date   IRON 56 07/27/2020   TIBC 269 07/27/2020   FERRITIN 164 (H) 07/27/2020    Assessment and Plan:   Other Fatigue  Tara Keller does feel that her weight is causing her energy to be lower than it should be. Fatigue may be related to obesity, depression or many other causes. Labs will be ordered, and in the meanwhile, Tara Keller will focus on self care including making healthy food choices, increasing physical activity and focusing on stress reduction.  Shortness of Breath  Tara Keller does feel that she gets out of breath more easily that she used to when she exercises. 's shortness of breath appears to be obesity related and exercise induced. She has agreed to work on weight loss and gradually increase exercise to treat her exercise induced shortness of breath. Will continue to monitor closely.  Tara Keller had a positive depression screening. Depression is commonly associated with obesity and often results in emotional eating behaviors. We will monitor this closely and work on CBT to  help improve the non-hunger eating patterns. Referral to Psychology may be required if no improvement is seen as she continues in our clinic.    Problem List Items Addressed This Visit       Respiratory   OSA on CPAP    She does not have a CPAP- she used to but the flow became too much and so she stopped using it.  Refer to see [facility].      Relevant Orders   Ambulatory referral to Neurology     Endocrine   Diabetes mellitus [facility])    Patient sees Dr. Lonzo Keller.  She is only on novolog and lantus.  Her last A1c was over 9.  She has a history of pancreatitis so she has not been given GLP1.  She is not sure why she is getting pancreatitis. She is doing a sliding scale of novolog and 80 units of lantus daily. She never has low blood sugars.  She has dexcom but the pharmacy did not fill her supplies in the last few months.       Relevant Medications   atorvastatin (LIPITOR) 40 MG tablet   Other Relevant Orders   Hemoglobin A1c   C-peptide   Hyperlipidemia associated with type 2 diabetes mellitus (HCC)    Previously on statin but no longer on as it was never refilled.  Will order FLP today and follow up at next appointment to discuss labs.  Refill of statin sent in.      Relevant Medications   atorvastatin (LIPITOR) 40 MG tablet   Other Relevant Orders   Lipid Panel With LDL/HDL Ratio     Other   Morbid obesity (HCC)   Episode of moderate major depression (HCC)    Patient seeing a provider to manage this.  She is on numerous medications and was previously hospitalized for major depression.  She is very tearful throughout the appointment today.  She denies suicidal and homicidal ideation.  Patient has appointment with therapist in next 12 days to start working thru some of her mental health issues.  She was given information on new treatment modalities such as TCMS and ketamine and older treatments like ECT.  She was encouraged to follow up with her provider for more aggressive management  and schedule more consistent follow ups with her soon to be therapist.      Vitamin D deficiency    On OTC Vit D.  Patient has significant fatigue.      Relevant Orders   VITAMIN D 25 Hydroxy (Vit-D Deficiency, Fractures)   Other Visit Diagnoses     Other fatigue    -  Primary   SOBOE (shortness of breath on exertion)       Relevant Orders   TSH   T4, free   T3   BMI 50.0-59.9, adult (HCC)           Jadea is currently in the action stage of change and her goal is to continue with weight loss efforts. I recommend Brianca begin the structured treatment plan as follows:  She has agreed to Category 3 Plan  Exercise goals: No exercise has been prescribed at this time.  Behavioral modification strategies:increasing lean protein intake, increasing vegetables, no skipping meals, meal planning and cooking strategies, and emotional eating strategies   She was informed of the importance of frequent follow-up visits to maximize her success with intensive lifestyle modifications for her multiple health conditions. She was informed we would discuss her lab results at her next visit unless there is a critical issue that needs to be addressed sooner. Lasya agreed to keep her next visit at the agreed upon time to discuss these results.   Attestation Statements:  Reviewed by clinician on day of visit: allergies, medications, problem list, medical history, surgical history, family history, social history, and previous encounter notes. This is the patient's first visit at [facility]. The patient's NEW PATIENT PACKET was reviewed at length. Included in the packet: current and past health history, medications, allergies, ROS, gynecologic history (women only), surgical history, family history, social history, weight history, weight loss surgery history (for those that have had weight loss surgery), nutritional evaluation, mood and food questionnaire, PHQ9, Epworth questionnaire, sleep  habits questionnaire, patient life and health improvement goals  questionnaire. These will all be scanned into the patient's chart under media.   During the visit, I independently reviewed the patient's EKG, bioimpedance scale results, and indirect calorimeter results. I used this information to tailor a meal plan for the patient that will help her to lose weight and will improve her obesity-related conditions going forward. I performed a medically necessary appropriate examination and/or evaluation. I discussed the assessment and treatment plan with the patient. The patient was provided an opportunity to ask questions and all were answered. The patient agreed with the plan and demonstrated an understanding of the instructions. Labs were ordered at this visit and will be reviewed at the next visit unless more critical results need to be addressed immediately. Clinical information was updated and documented in the EMR.    Time spent on visit including pre-visit chart review and post-visit charting and care was 60 minutes.   Reuben Likes, MD

## 2022-11-22 NOTE — Assessment & Plan Note (Signed)
She does not have a CPAP- she used to but the flow became too much and so she stopped using it.  Refer to see [facility].

## 2022-11-22 NOTE — Assessment & Plan Note (Addendum)
Patient seeing a provider to manage this.  She is on numerous medications and was previously hospitalized for major depression.  She is very tearful throughout the appointment today.  She denies suicidal and homicidal ideation.  Patient has appointment with therapist in next 12 days to start working thru some of her mental health issues.  She was given information on new treatment modalities such as TCMS and ketamine and older treatments like ECT.  She was encouraged to follow up with her provider for more aggressive management and schedule more consistent follow ups with her soon to be therapist.

## 2022-11-22 NOTE — Assessment & Plan Note (Signed)
Previously on statin but no longer on as it was never refilled.  Will order FLP today and follow up at next appointment to discuss labs.  Refill of statin sent in.

## 2022-11-23 LAB — LIPID PANEL WITH LDL/HDL RATIO
Cholesterol, Total: 109 mg/dL (ref 100–199)
HDL: 45 mg/dL (ref >39)
LDL Chol Calc (NIH): 46 mg/dL (ref 0–99)
LDL/HDL Ratio: 1 ratio (ref 0.0–3.2)
Triglycerides: 95 mg/dL (ref 0–149)
VLDL Cholesterol Cal: 18 mg/dL (ref 5–40)

## 2022-11-23 LAB — TSH: TSH: 1.7 u[IU]/mL (ref 0.450–4.500)

## 2022-11-23 LAB — T4, FREE: Free T4: 0.99 ng/dL (ref 0.82–1.77)

## 2022-11-23 LAB — VITAMIN D 25 HYDROXY (VIT D DEFICIENCY, FRACTURES): Vit D, 25-Hydroxy: 33.7 ng/mL (ref 30.0–100.0)

## 2022-11-23 LAB — HEMOGLOBIN A1C
Est. average glucose Bld gHb Est-mCnc: 255 mg/dL
Hgb A1c MFr Bld: 10.5 % — ABNORMAL HIGH (ref 4.8–5.6)

## 2022-11-23 LAB — T3: T3, Total: 148 ng/dL (ref 71–180)

## 2022-11-23 LAB — C-PEPTIDE: C-Peptide: 3 ng/mL (ref 1.1–4.4)

## 2022-12-04 ENCOUNTER — Encounter (HOSPITAL_COMMUNITY): Payer: Self-pay

## 2022-12-04 ENCOUNTER — Ambulatory Visit (HOSPITAL_COMMUNITY): Payer: Medicaid Other | Admitting: Mental Health

## 2022-12-04 ENCOUNTER — Ambulatory Visit (HOSPITAL_COMMUNITY)
Admission: RE | Admit: 2022-12-04 | Discharge: 2022-12-04 | Disposition: A | Discharge status: Home / Self Care | Payer: MEDICAID | Source: Ambulatory Visit | Attending: Internal Medicine | Admitting: Internal Medicine

## 2022-12-04 VITALS — BP 132/68 | HR 89 | Temp 98.0°F | Resp 18

## 2022-12-04 DIAGNOSIS — J069 Acute upper respiratory infection, unspecified: Secondary | ICD-10-CM | POA: Diagnosis present

## 2022-12-04 MED ORDER — BENZONATATE 100 MG PO CAPS: 100.0000 mg | ORAL_CAPSULE | Freq: Three times a day (TID) | ORAL | 0 refills | Status: DC | PRN

## 2022-12-04 MED ORDER — FLUTICASONE PROPIONATE 50 MCG/ACT NA SUSP: 1.0000 | Freq: Every day | NASAL | 0 refills | Status: AC
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
## 2022-12-04 NOTE — ED Triage Notes (Signed)
Pt reports cough ,right ear pain and nasal congestion x 2 days. Pt has not taken any medication.

## 2022-12-04 NOTE — Discharge Instructions (Signed)
Suspect viral cause to your symptoms as we discussed.  I have prescribed you 2 medications to help with symptoms.  COVID test is pending.

## 2022-12-05 ENCOUNTER — Ambulatory Visit (HOSPITAL_COMMUNITY): Payer: Self-pay | Admitting: Mental Health

## 2022-12-05 LAB — SARS CORONAVIRUS 2 (TAT 6-24 HRS): SARS Coronavirus 2: NEGATIVE

## 2022-12-12 ENCOUNTER — Encounter: Payer: Self-pay | Admitting: Podiatry

## 2022-12-12 ENCOUNTER — Ambulatory Visit (INDEPENDENT_AMBULATORY_CARE_PROVIDER_SITE_OTHER): Payer: MEDICAID | Admitting: Podiatry

## 2022-12-12 DIAGNOSIS — E1142 Type 2 diabetes mellitus with diabetic polyneuropathy: Secondary | ICD-10-CM | POA: Diagnosis not present

## 2022-12-12 DIAGNOSIS — B351 Tinea unguium: Secondary | ICD-10-CM | POA: Diagnosis not present

## 2022-12-12 DIAGNOSIS — M79674 Pain in right toe(s): Secondary | ICD-10-CM

## 2022-12-12 DIAGNOSIS — M79675 Pain in left toe(s): Secondary | ICD-10-CM

## 2022-12-12 DIAGNOSIS — Z794 Long term (current) use of insulin: Secondary | ICD-10-CM

## 2022-12-12 NOTE — Progress Notes (Signed)
Complaint:  Visit Type: Patient returns to my office for continued preventative foot care services. Complaint: Patient states" my nails have grown long and thick and become painful to walk and wear shoes" Patient has been diagnosed with DM with neuropathy. The patient presents for preventative foot care services.  Podiatric Exam: Vascular: dorsalis pedis and posterior tibial pulses are palpable bilateral. Capillary return is immediate. Temperature gradient is WNL. Skin turgor WNL  Sensorium: Diminished  Semmes Weinstein monofilament test. Normal tactile sensation bilaterally. Nail Exam: Pt has thick disfigured discolored nails with subungual debris noted bilateral entire nail hallux through fifth toenails Ulcer Exam: There is no evidence of ulcer or pre-ulcerative changes or infection. Orthopedic Exam: Muscle tone and strength are WNL. No limitations in general ROM. No crepitus or effusions noted. Foot type and digits show no abnormalities. HAV  B/L. Skin: No Porokeratosis. No infection or ulcers  Diagnosis:  Onychomycosis, , Pain in right toe, pain in left toes  Treatment & Plan Procedures and Treatment: Consent by patient was obtained for treatment procedures.   Debridement of mycotic and hypertrophic toenails, 1 through 5 bilateral and clearing of subungual debris. No ulceration, no infection noted.  Return Visit-Office Procedure: Patient instructed to return to the office for a follow up visit 3 months for continued evaluation and treatment.    Isley Weisheit DPM 

## 2022-12-13 ENCOUNTER — Ambulatory Visit (HOSPITAL_COMMUNITY): Payer: Self-pay | Admitting: Mental Health

## 2022-12-13 ENCOUNTER — Encounter (INDEPENDENT_AMBULATORY_CARE_PROVIDER_SITE_OTHER): Payer: Self-pay | Admitting: Family Medicine

## 2022-12-13 ENCOUNTER — Ambulatory Visit (INDEPENDENT_AMBULATORY_CARE_PROVIDER_SITE_OTHER): Payer: MEDICAID | Admitting: Family Medicine

## 2022-12-13 VITALS — BP 113/72 | HR 96 | Temp 97.9°F | Ht 66.0 in | Wt 316.0 lb

## 2022-12-13 DIAGNOSIS — Z6841 Body Mass Index (BMI) 40.0 and over, adult: Secondary | ICD-10-CM

## 2022-12-13 DIAGNOSIS — Z794 Long term (current) use of insulin: Secondary | ICD-10-CM | POA: Diagnosis not present

## 2022-12-13 DIAGNOSIS — E1142 Type 2 diabetes mellitus with diabetic polyneuropathy: Secondary | ICD-10-CM | POA: Diagnosis not present

## 2022-12-13 DIAGNOSIS — E559 Vitamin D deficiency, unspecified: Secondary | ICD-10-CM | POA: Diagnosis not present

## 2022-12-13 DIAGNOSIS — Z7984 Long term (current) use of oral hypoglycemic drugs: Secondary | ICD-10-CM

## 2022-12-13 MED ORDER — VITAMIN D (ERGOCALCIFEROL) 1.25 MG (50000 UNIT) PO CAPS: 50000.0000 [IU] | ORAL_CAPSULE | ORAL | 0 refills | Status: DC

## 2022-12-13 NOTE — Assessment & Plan Note (Addendum)
Patient on Novolog, Lantus, Tara Keller.  She has a history of pancreatitis and so has not been able to take any GLP-1.  Does not have blood sugar monitor with her today so unable to assess whether her blood sugars have responded to her changing her eating habits.  She was encouraged to continue monitoring blood sugars and bring her monitor next time to discuss. Pathophysiology of progression through insulin resistance to prediabetes and diabetes was discussed at length today.  Patient to continue to monitor and be in control of total intake of snack calories which may be simple carbohydrates but should be consumed only after the patient has taken in all the nutrition for the day.  Macronutrient identification, classification and daily intake ratios were discussed.  Plan to repeat labs in 3 months to monitor both hemoglobin A1c and insulin levels.  No medication changes at this time.

## 2022-12-13 NOTE — Progress Notes (Signed)
SUBJECTIVE:  Chief Complaint: Obesity  Interim History: Patient did get to eat some indulgent foods at Thanksgiving but did not eat any sweets like cakes or pies.  She did fix a salad and made oil and vinegar dressing and she made chicken and fried it but only ate two pieces.  She incorporated 1 cup of vegetables at her meals. She bought [place], chicken and salmon.  She tried a [place] english muffin and bought [place] bacon. She did not use the food scale.  She is traveling to celebrate Christmas and her grandson's birthday.   Tara Keller is here to discuss her progress with her obesity treatment plan. She is on the Category 3 Plan and states she is following her eating plan approximately 70 % of the time. She states she is exercising 20  minutes 2 times per week.   OBJECTIVE: Visit Diagnoses: Problem List Items Addressed This Visit       Endocrine   Type 2 diabetes mellitus with diabetic polyneuropathy, with long-term current use of insulin (HCC) - Primary   Patient on Novolog, Lantus, Farxiga.  She has a history of pancreatitis and so has not been able to take any GLP-1.  Does not have blood sugar monitor with her today so unable to assess whether her blood sugars have responded to her changing her eating habits.  She was encouraged to continue monitoring blood sugars and bring her monitor next time to discuss. Pathophysiology of progression through insulin resistance to prediabetes and diabetes was discussed at length today.  Patient to continue to monitor and be in control of total intake of snack calories which may be simple carbohydrates but should be consumed only after the patient has taken in all the nutrition for the day.  Macronutrient identification, classification and daily intake ratios were discussed.  Plan to repeat labs in 3 months to monitor both hemoglobin A1c and insulin levels.  No medication changes at this time.           Other   Morbid obesity (HCC)   Vitamin D deficiency    Discussed importance of vitamin d supplementation.  Vitamin d supplementation has been shown to decrease fatigue, decrease risk of progression to insulin resistance and then prediabetes, decreases risk of falling in older age and can even assist in decreasing depressive symptoms in PTSD.   Prescription for Vitamin D sent in.        Relevant Medications   Vitamin D, Ergocalciferol, (DRISDOL) 1.25 MG (50000 UNIT) CAPS capsule   Other Visit Diagnoses       BMI 50.0-59.9, adult (HCC)           No data recorded No data recorded  No data recorded No data recorded    ASSESSMENT AND PLAN:  Diet: Tara Keller is currently in the action stage of change. As such, her goal is to continue with weight loss efforts. She has agreed to Category 3 Plan.  Exercise: Tara Keller has been instructed that some exercise is better than none for weight loss and overall health benefits.   Behavior Modification:  We discussed the following Behavioral Modification Strategies today: increasing lean protein intake, increasing vegetables, meal planning and cooking strategies, better snacking choices, and holiday eating strategies.   No follow-ups on file.[name] She was informed of the importance of frequent follow up visits to maximize her success with intensive lifestyle modifications for her multiple health conditions.  Attestation Statements:   Reviewed by clinician on day of visit: allergies, medications, problem list,  medical history, surgical history, family history, social history, and previous encounter notes.   Time spent on visit including pre-visit chart review and post-visit care and charting was 40 minutes.    Reuben Likes, MD

## 2022-12-20 ENCOUNTER — Encounter: Payer: Self-pay | Admitting: Neurology

## 2022-12-20 ENCOUNTER — Ambulatory Visit: Payer: MEDICAID | Admitting: Neurology

## 2022-12-20 VITALS — BP 122/72 | HR 82 | Ht 66.0 in | Wt 319.0 lb

## 2022-12-20 DIAGNOSIS — R351 Nocturia: Secondary | ICD-10-CM

## 2022-12-20 DIAGNOSIS — G4733 Obstructive sleep apnea (adult) (pediatric): Secondary | ICD-10-CM | POA: Diagnosis not present

## 2022-12-20 DIAGNOSIS — R519 Headache, unspecified: Secondary | ICD-10-CM | POA: Diagnosis not present

## 2022-12-20 DIAGNOSIS — Z6841 Body Mass Index (BMI) 40.0 and over, adult: Secondary | ICD-10-CM

## 2022-12-20 DIAGNOSIS — Z820 Family history of epilepsy and other diseases of the nervous system: Secondary | ICD-10-CM

## 2022-12-20 DIAGNOSIS — R634 Abnormal weight loss: Secondary | ICD-10-CM

## 2022-12-20 NOTE — Patient Instructions (Signed)
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
## 2022-12-24 NOTE — Assessment & Plan Note (Signed)
 Discussed importance of vitamin d supplementation.  Vitamin d supplementation has been shown to decrease fatigue, decrease risk of progression to insulin resistance and then prediabetes, decreases risk of falling in older age and can even assist in decreasing depressive symptoms in PTSD.   Prescription for Vitamin D sent in.

## 2022-12-25 ENCOUNTER — Ambulatory Visit: Payer: MEDICAID | Admitting: Internal Medicine
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
## 2022-12-26 ENCOUNTER — Ambulatory Visit (INDEPENDENT_AMBULATORY_CARE_PROVIDER_SITE_OTHER): Payer: MEDICAID | Admitting: Internal Medicine

## 2022-12-26 VITALS — BP 124/80 | HR 92 | Ht 66.0 in | Wt 316.0 lb

## 2022-12-26 DIAGNOSIS — Z794 Long term (current) use of insulin: Secondary | ICD-10-CM

## 2022-12-26 DIAGNOSIS — E1165 Type 2 diabetes mellitus with hyperglycemia: Secondary | ICD-10-CM | POA: Diagnosis not present

## 2022-12-26 DIAGNOSIS — E785 Hyperlipidemia, unspecified: Secondary | ICD-10-CM | POA: Diagnosis not present

## 2022-12-26 DIAGNOSIS — E1169 Type 2 diabetes mellitus with other specified complication: Secondary | ICD-10-CM | POA: Diagnosis not present

## 2022-12-26 LAB — POCT GLYCOSYLATED HEMOGLOBIN (HGB A1C): Hemoglobin A1C: 9.8 % — AB (ref 4.0–5.6)

## 2022-12-26 MED ORDER — ACCU-CHEK FASTCLIX LANCETS MISC: 1 refills | Status: DC

## 2022-12-26 MED ORDER — METFORMIN HCL ER 750 MG PO TB24: 750.0000 mg | ORAL_TABLET | Freq: Two times a day (BID) | ORAL | 3 refills | Status: DC

## 2022-12-26 MED ORDER — ACCU-CHEK GUIDE TEST VI STRP: 1.0000 | ORAL_STRIP | Freq: Four times a day (QID) | 3 refills | Status: DC

## 2022-12-26 MED ORDER — LANTUS SOLOSTAR 100 UNIT/ML ~~LOC~~ SOPN: 84.0000 [IU] | PEN_INJECTOR | Freq: Every day | SUBCUTANEOUS | 5 refills | Status: DC

## 2022-12-26 MED ORDER — DEXCOM G7 SENSOR MISC: 1.0000 | 3 refills | Status: DC

## 2022-12-26 MED ORDER — INSULIN PEN NEEDLE 31G X 5 MM MISC: 1.0000 | Freq: Four times a day (QID) | 3 refills | Status: DC

## 2022-12-26 MED ORDER — NOVOLOG FLEXPEN 100 UNIT/ML ~~LOC~~ SOPN: PEN_INJECTOR | SUBCUTANEOUS | 3 refills | Status: DC

## 2022-12-26 NOTE — Patient Instructions (Signed)
-   Continue Metformin 750 mg to 1 tablet TWICE a day  - Continue Farxiga 10 mg daily  - Increase Lantus to 84 units ONCE a day  - Continue  Novolog 14 units with each meal    - Novolog correctional insulin: ADD extra units on insulin to your meal-time Novolog dose if your blood sugars are higher than 160. Use the scale below to help guide you:   Blood sugar before meal Number of units to inject  Less than 160 0 unit  161 - 190 1 units  191 - 220 2 units  221 - 250 3 units  251 - 280 4 units  281 - 310 5 units  311 - 340 6 units  341 - 370 7 units  371 - 400 8 units      HOW TO TREAT LOW BLOOD SUGARS (Blood sugar LESS THAN 70 MG/DL) Please follow the RULE OF 15 for the treatment of hypoglycemia treatment (when your (blood sugars are less than 70 mg/dL)   STEP 1: Take 15 grams of carbohydrates when your blood sugar is low, which includes:  3-4 GLUCOSE TABS  OR 3-4 OZ OF JUICE OR REGULAR SODA OR ONE TUBE OF GLUCOSE GEL    STEP 2: RECHECK blood sugar in 15 MINUTES STEP 3: If your blood sugar is still low at the 15 minute recheck --> then, go back to STEP 1 and treat AGAIN with another 15 grams of carbohydrates.
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
## 2023-01-15 ENCOUNTER — Ambulatory Visit (INDEPENDENT_AMBULATORY_CARE_PROVIDER_SITE_OTHER): Payer: MEDICAID | Admitting: Physician Assistant

## 2023-01-15 ENCOUNTER — Encounter (INDEPENDENT_AMBULATORY_CARE_PROVIDER_SITE_OTHER): Payer: Self-pay | Admitting: Physician Assistant

## 2023-01-15 VITALS — BP 101/69 | HR 70 | Temp 97.9°F | Ht 66.0 in | Wt 312.0 lb

## 2023-01-15 DIAGNOSIS — E559 Vitamin D deficiency, unspecified: Secondary | ICD-10-CM | POA: Diagnosis not present

## 2023-01-15 DIAGNOSIS — Z7984 Long term (current) use of oral hypoglycemic drugs: Secondary | ICD-10-CM

## 2023-01-15 DIAGNOSIS — Z6841 Body Mass Index (BMI) 40.0 and over, adult: Secondary | ICD-10-CM | POA: Diagnosis not present

## 2023-01-15 DIAGNOSIS — Z794 Long term (current) use of insulin: Secondary | ICD-10-CM | POA: Diagnosis not present

## 2023-01-15 DIAGNOSIS — E1142 Type 2 diabetes mellitus with diabetic polyneuropathy: Secondary | ICD-10-CM

## 2023-01-15 MED ORDER — VITAMIN D (ERGOCALCIFEROL) 1.25 MG (50000 UNIT) PO CAPS: 50000.0000 [IU] | ORAL_CAPSULE | ORAL | 0 refills | Status: DC

## 2023-01-15 NOTE — Progress Notes (Signed)
 SUBJECTIVE: Discussed the use of AI scribe software for clinical note transcription with the patient, who gave verbal consent to proceed.  Chief Complaint: Obesity  Interim History: She is down 4 lbs since her last visit. (+ 1 lb overall. ) Muscle mass + 4 lbs Adipose mass - 8.2 lbs.   Tara Keller is a 58 year old female with a history of type 2 diabetes, managed with Lantus  insulin , Novolog , metformin , and Farxiga . She has been making dietary changes, focusing on protein intake and limiting carbohydrates, which has resulted in weight loss. She has also incorporated physical activity into her routine, including leg exercises and using a peddler. Despite these lifestyle changes, the patient's morning fasting blood sugars have been consistently in the 180s.  The patient has a history of pancreatitis, which has limited the use of GLP-1 RA medications.  She also has a history of cervical cancer. The patient has been taking vitamin D  supplements, which have reportedly improved her energy levels.   The patient has a family history of heart disease and cancer, with multiple siblings having passed away from these conditions.  The patient has been following a specific dietary plan and has expressed a desire for more variety in her breakfast options. She has been experimenting with different foods, such as turkey bacon and chicken bacon, and has been considering adding certain cereals to her diet.  Tara Keller is here to discuss her progress with her obesity treatment plan. She is on the Category 3 Plan and states she is following her eating plan approximately 20 % of the time. She states she is exercising and using a pedaler 30 minutes 4 times per week.  Sees Dr. Sam for Type 2 diabetes management.  HOME DIABETES REGIMEN:  Metformin  750 mg  1 tablet TWICE a day  Glipizide  5 mg XL daily - not taking  Farxiga  10 mg daily  Lantus  80 units ONCE a day  Novolog  14 units with meals  CF: Novolog  ( BG  -150/20)   OBJECTIVE: Visit Diagnoses: Problem List Items Addressed This Visit     Morbid obesity (HCC)   Type 2 diabetes mellitus with diabetic polyneuropathy, with long-term current use of insulin  (HCC) - Primary   Vitamin D  deficiency   Relevant Medications   Vitamin D , Ergocalciferol , (DRISDOL ) 1.25 MG (50000 UNIT) CAPS capsule  Obesity Management Patient has lost 8.2 pounds of adipose tissue and gained 4 pounds of muscle mass through dietary changes and increased physical activity. Discussed additional breakfast options and benefits of Fairlife milk for protein intake. Encouraged continued use of peddler for physical activity. - Continue dietary plan with additional breakfast options - Encourage Fairlife milk for protein intake - Promote use of peddler for physical activity - Monitor weight and muscle mass   Type 2 Diabetes Mellitus Lab Results  Component Value Date   HGBA1C 9.8 (A) 12/26/2022   HGBA1C 10.5 (H) 11/22/2022   HGBA1C 9.2 (A) 09/15/2022   Lab Results  Component Value Date   MICROALBUR <0.7 11/07/2021   LDLCALC 46 11/22/2022   CREATININE 0.78 11/05/2022    Long-standing type 2 diabetes mellitus managed with Lantus  insulin , Novolog , metformin , and Farxiga . On statin. On ACE.   Blood glucose levels are variable, with fasting levels around 185 mg/dL and postprandial levels 230-245 mg/dL. A1c remains significantly elevated.  History of pancreatitis precludes GLP-1 receptor agonists.  Dietary changes have led to weight loss and improved muscle mass. Emphasized continued dietary modifications and regular physical activity. - Continue Lantus  insulin   84 units daily, Novolog , metformin , and Farxiga  as prescribed.  - Encourage low carbohydrate, high protein diet - Promote regular physical activity - Monitor blood glucose levels regularly.  - Follow up with endocrinologist Dr. Sam as instructed.   Vitamin D  Deficiency Vitamin D  is not at goal of 50.  Most  recent vitamin D  level was 33.7. She is on  prescription ergocalciferol  50,000 IU weekly. No N/V or muscle weakness with Ergocalciferol . Feels energy level is slightly improved.  Lab Results  Component Value Date   VD25OH 33.7 11/22/2022   VD25OH 41.2 04/06/2020   VD25OH 23.8 (L) 06/19/2017    Plan: Continue and refill  prescription ergocalciferol  50,000 IU weekly Low vitamin D  levels can be associated with adiposity and may result in leptin resistance and weight gain. Also associated with fatigue. Currently on vitamin D  supplementation without any adverse effects.    Follow-up - Schedule follow-up appointment with Dr. Berkeley on February 06, 2023, at 3:20 PM.  Vitals Temp: 97.9 F (36.6 C) BP: 101/69 Pulse Rate: 70 SpO2: 97 %   Anthropometric Measurements Height: 5' 6 (1.676 m) Weight: (!) 312 lb (141.5 kg) BMI (Calculated): 50.38 Weight at Last Visit: 316  lb Weight Lost Since Last Visit: 4 lb Weight Gained Since Last Visit: 0 Starting Weight: 311 lb Total Weight Loss (lbs): 4 lb (1.814 kg) Peak Weight: 450 lb Waist Measurement : 64 inches   Body Composition  Body Fat %: 55.1 % Fat Mass (lbs): 172 lbs Muscle Mass (lbs): 133.2 lbs Total Body Water  (lbs): 98.6 lbs Visceral Fat Rating : 21   Other Clinical Data RMR: [id number] Fasting: no Labs: no Today's Visit #: 3 Starting Date: 11/22/22     ASSESSMENT AND PLAN:  Diet: Carolynn is currently in the action stage of change. As such, her goal is to continue with weight loss efforts. She has agreed to Category 3 Plan and with additional breakfast options .  Exercise: Tessa has been instructed that some exercise is better than none and to continue exercising as is for weight loss and overall health benefits.   Behavior Modification:  We discussed the following Behavioral Modification Strategies today: increasing lean protein intake, decreasing simple carbohydrates, increasing vegetables, increase H2O intake,  increase high fiber foods, meal planning and cooking strategies, avoiding temptations, and planning for success. We discussed various medication options to help Mishel with her weight loss efforts and we both agreed to continue usual medications for Type 2 diabetes as noted and continue to work on nutritional and behavioral strategies to promote weight loss.  . Return in about 3 weeks (around 02/05/2023).SABRA She was informed of the importance of frequent follow up visits to maximize her success with intensive lifestyle modifications for her multiple health conditions.  Attestation Statements:   Reviewed by clinician on day of visit: allergies, medications, problem list, medical history, surgical history, family history, social history, and previous encounter notes.   Time spent on visit including pre-visit chart review and post-visit care and charting was 43 minutes.    Kellie Murrill, PA-C

## 2023-01-16 ENCOUNTER — Encounter: Payer: Self-pay | Admitting: Family Medicine

## 2023-01-16 ENCOUNTER — Ambulatory Visit (INDEPENDENT_AMBULATORY_CARE_PROVIDER_SITE_OTHER): Payer: MEDICAID | Admitting: Family Medicine

## 2023-01-16 VITALS — BP 110/75 | HR 70 | Temp 97.7°F | Resp 18 | Ht 66.0 in | Wt 319.6 lb

## 2023-01-16 DIAGNOSIS — R300 Dysuria: Secondary | ICD-10-CM | POA: Diagnosis not present

## 2023-01-16 MED ORDER — NITROFURANTOIN MONOHYD MACRO 100 MG PO CAPS: 100.0000 mg | ORAL_CAPSULE | Freq: Two times a day (BID) | ORAL | 0 refills | Status: DC

## 2023-01-17 ENCOUNTER — Encounter: Payer: Self-pay | Admitting: Family Medicine
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
## 2023-01-22 ENCOUNTER — Other Ambulatory Visit: Payer: Self-pay

## 2023-01-22 ENCOUNTER — Encounter: Payer: Self-pay | Admitting: Psychiatry

## 2023-01-22 ENCOUNTER — Inpatient Hospital Stay: Payer: MEDICAID

## 2023-01-22 ENCOUNTER — Inpatient Hospital Stay: Payer: MEDICAID | Attending: Psychiatry | Admitting: Psychiatry

## 2023-01-22 VITALS — BP 121/65 | HR 73 | Temp 98.0°F | Resp 20 | Wt 316.8 lb

## 2023-01-22 DIAGNOSIS — Z90710 Acquired absence of both cervix and uterus: Secondary | ICD-10-CM | POA: Insufficient documentation

## 2023-01-22 DIAGNOSIS — Z8542 Personal history of malignant neoplasm of other parts of uterus: Secondary | ICD-10-CM | POA: Insufficient documentation

## 2023-01-22 DIAGNOSIS — Z90722 Acquired absence of ovaries, bilateral: Secondary | ICD-10-CM | POA: Diagnosis not present

## 2023-01-22 DIAGNOSIS — C541 Malignant neoplasm of endometrium: Secondary | ICD-10-CM

## 2023-01-22 DIAGNOSIS — Z9079 Acquired absence of other genital organ(s): Secondary | ICD-10-CM | POA: Diagnosis not present

## 2023-01-22 LAB — BASIC METABOLIC PANEL
Anion gap: 5 (ref 5–15)
BUN: 11 mg/dL (ref 6–20)
CO2: 29 mmol/L (ref 22–32)
Calcium: 9.6 mg/dL (ref 8.9–10.3)
Chloride: 101 mmol/L (ref 98–111)
Creatinine, Ser: 0.78 mg/dL (ref 0.44–1.00)
GFR, Estimated: >60 mL/min (ref >60)
Glucose, Bld: 204 mg/dL — ABNORMAL HIGH (ref 70–99)
Potassium: 4.1 mmol/L (ref 3.5–5.1)
Sodium: 135 mmol/L (ref 135–145)

## 2023-01-22 NOTE — Patient Instructions (Signed)
 It was a pleasure to see you in clinic today. - Normal exam - CT scan to make sure the back pain isn't related to your prior cancer process - Return visit planned for 6 months unless needed sooner from CT scan results  Thank you very much for allowing me to provide care for you today.  I appreciate your confidence in choosing our Gynecologic Oncology team at [facility].  If you have any questions about your visit today please call our office or send us  a MyChart message and we will get back to you as soon as possible.
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
## 2023-01-23 ENCOUNTER — Telehealth: Payer: Self-pay | Admitting: *Deleted

## 2023-01-23 NOTE — Telephone Encounter (Signed)
-----   Message from Eleanor JONETTA Epps sent at 01/22/2023  3:36 PM EST ----- Kidney function is within normal range and stable compared to previous. Blood sugar elevated on lab at 204. Continue monitoring glucose per PCP recs. ----- Message ----- From: Rebecka, Lab In [place] Sent: 01/22/2023   3:17 PM EST To: Eleanor JONETTA Epps, NP

## 2023-01-23 NOTE — Telephone Encounter (Signed)
 Spoke with patient and relayed message from Eleanor Epps, NP that pt's kidney function is within normal range and stable compared to previous. Blood sugar elevated on lab at 204. Continue to monitor glucose per PCP recommendations. Pt states she wasn't fasting for this blood draw. Pt verbalized understanding and thanked the office for calling.

## 2023-01-30 ENCOUNTER — Ambulatory Visit (HOSPITAL_COMMUNITY): Payer: MEDICAID

## 2023-01-31 ENCOUNTER — Ambulatory Visit: Payer: MEDICAID | Admitting: Internal Medicine

## 2023-02-05 ENCOUNTER — Ambulatory Visit (HOSPITAL_COMMUNITY)
Admission: RE | Admit: 2023-02-05 | Discharge: 2023-02-05 | Disposition: A | Discharge status: Home / Self Care | Payer: MEDICAID | Source: Ambulatory Visit | Attending: Psychiatry | Admitting: Psychiatry

## 2023-02-05 ENCOUNTER — Encounter (HOSPITAL_COMMUNITY): Payer: Self-pay

## 2023-02-05 DIAGNOSIS — C541 Malignant neoplasm of endometrium: Secondary | ICD-10-CM | POA: Insufficient documentation

## 2023-02-05 MED ORDER — IOHEXOL 300 MG/ML  SOLN
30.0000 mL | Freq: Once | INTRAMUSCULAR | Status: AC | PRN
  Administered 2023-02-05: 30 mL via ORAL

## 2023-02-05 MED ORDER — IOHEXOL 300 MG/ML  SOLN
100.0000 mL | Freq: Once | INTRAMUSCULAR | Status: AC | PRN
  Administered 2023-02-05: 100 mL via INTRAVENOUS

## 2023-02-06 ENCOUNTER — Ambulatory Visit: Payer: MEDICAID | Admitting: Neurology

## 2023-02-06 ENCOUNTER — Encounter (INDEPENDENT_AMBULATORY_CARE_PROVIDER_SITE_OTHER): Payer: Self-pay | Admitting: Family Medicine

## 2023-02-06 ENCOUNTER — Ambulatory Visit (INDEPENDENT_AMBULATORY_CARE_PROVIDER_SITE_OTHER): Payer: MEDICAID | Admitting: Family Medicine

## 2023-02-06 VITALS — BP 136/76 | HR 71 | Temp 97.7°F | Ht 66.0 in | Wt 311.0 lb

## 2023-02-06 DIAGNOSIS — E785 Hyperlipidemia, unspecified: Secondary | ICD-10-CM

## 2023-02-06 DIAGNOSIS — G4734 Idiopathic sleep related nonobstructive alveolar hypoventilation: Secondary | ICD-10-CM

## 2023-02-06 DIAGNOSIS — G4733 Obstructive sleep apnea (adult) (pediatric): Secondary | ICD-10-CM | POA: Diagnosis not present

## 2023-02-06 DIAGNOSIS — E1142 Type 2 diabetes mellitus with diabetic polyneuropathy: Secondary | ICD-10-CM | POA: Diagnosis not present

## 2023-02-06 DIAGNOSIS — Z794 Long term (current) use of insulin: Secondary | ICD-10-CM

## 2023-02-06 DIAGNOSIS — Z7984 Long term (current) use of oral hypoglycemic drugs: Secondary | ICD-10-CM

## 2023-02-06 DIAGNOSIS — E669 Obesity, unspecified: Secondary | ICD-10-CM

## 2023-02-06 DIAGNOSIS — E559 Vitamin D deficiency, unspecified: Secondary | ICD-10-CM

## 2023-02-06 DIAGNOSIS — Z6841 Body Mass Index (BMI) 40.0 and over, adult: Secondary | ICD-10-CM

## 2023-02-06 DIAGNOSIS — R634 Abnormal weight loss: Secondary | ICD-10-CM

## 2023-02-06 DIAGNOSIS — E1169 Type 2 diabetes mellitus with other specified complication: Secondary | ICD-10-CM | POA: Diagnosis not present

## 2023-02-06 DIAGNOSIS — R519 Headache, unspecified: Secondary | ICD-10-CM

## 2023-02-06 DIAGNOSIS — R351 Nocturia: Secondary | ICD-10-CM

## 2023-02-06 DIAGNOSIS — Z820 Family history of epilepsy and other diseases of the nervous system: Secondary | ICD-10-CM

## 2023-02-06 MED ORDER — VITAMIN D (ERGOCALCIFEROL) 1.25 MG (50000 UNIT) PO CAPS: 50000.0000 [IU] | ORAL_CAPSULE | ORAL | 0 refills | Status: DC

## 2023-02-06 NOTE — Assessment & Plan Note (Signed)
Picked up at home sleep study today and will do that tonight. Will follow up on results at next appointment.

## 2023-02-06 NOTE — Progress Notes (Signed)
   SUBJECTIVE:  Chief Complaint: Obesity  Interim History: Patient has been following Category 3 meal plan as much as she can over the last few weeks. Her food intake is limited based on what she can afford.  She is doing a lot of reading and learning about diabetes, sleep, physical activity.  She is doing 10 minutes of exercise 3 times a week of physical activity. Over the next few weeks she doesn't have much coming up.  Tara Keller is here to discuss her progress with her obesity treatment plan. She is on the Category 3 Plan and states she is following her eating plan approximately 25 % of the time. She states she is exercising 10 minutes 3 times per week.   OBJECTIVE: Visit Diagnoses: Problem List Items Addressed This Visit       Respiratory   OSA on CPAP   Picked up at home sleep study today and will do that tonight. Will follow up on results at next appointment.        Endocrine   Type 2 diabetes mellitus with diabetic polyneuropathy, with long-term current use of insulin (HCC) - Primary   Patient is on novolog, lantus, farxiga.  She has been taking 10 units of novolog when her blood sugars are better controlled.  She stopped glipizide.  She is checking her blood sugars 4 times a day because her dexcom is without a battery.  Patient unfortunately did not have her blood sugar log with her today.  She was encouraged to bring blood sugar log at next appointment so we can further discuss management of her diabetes.  Until that time we will not make any other adjustments to her medication as I do not know what her blood sugars have been.      Hyperlipidemia associated with type 2 diabetes mellitus (HCC)   Lipitor 40 mg was started in November for patient given her elevated cholesterol and diabetes.  She got a 90-day supply with refills and does not need a refill today.         Other   Vitamin D deficiency   Patient is on prescription strength vitamin D and reported no side effects of  starting this.  She is noticing a slight improvement in her energy.  She needs a refill of it today.      Relevant Medications   Vitamin D, Ergocalciferol, (DRISDOL) 1.25 MG (50000 UNIT) CAPS capsule    No data recorded  No data recorded  No data recorded  No data recorded    ASSESSMENT AND PLAN:  Diet: Tara Keller is currently in the action stage of change. As such, her goal is to continue with weight loss efforts. She has agreed to Category 2 Plan.  Exercise: Tara Keller has been instructed that some exercise is better than none for weight loss and overall health benefits.   Behavior Modification:  We discussed the following Behavioral Modification Strategies today: increasing lean protein intake, decreasing simple carbohydrates, increasing vegetables, decrease liquid calories, and meal planning and cooking strategies.   No follow-ups on file.[name] She was informed of the importance of frequent follow up visits to maximize her success with intensive lifestyle modifications for her multiple health conditions.  Attestation Statements:   Reviewed by clinician on day of visit: allergies, medications, problem list, medical history, surgical history, family history, social history, and previous encounter notes.      Reuben Likes, MD

## 2023-02-06 NOTE — Assessment & Plan Note (Addendum)
Patient is on novolog, lantus, farxiga.  She has been taking 10 units of novolog when her blood sugars are better controlled.  She stopped glipizide.  She is checking her blood sugars 4 times a day because her dexcom is without a battery.  Patient unfortunately did not have her blood sugar log with her today.  She was encouraged to bring blood sugar log at next appointment so we can further discuss management of her diabetes.  Until that time we will not make any other adjustments to her medication as I do not know what her blood sugars have been.

## 2023-02-07 NOTE — Progress Notes (Signed)
See procedure note.

## 2023-02-09 NOTE — Procedures (Signed)
[name]  HOME SLEEP TEST (Watch PAT) REPORT  STUDY DATE: 02/06/2023  DOB: 29-Aug-1965  MRN: [id number]  ORDERING CLINICIAN: Huston Foley, MD, PhD   REFERRING CLINICIAN: Dr. Donny Pique  CLINICAL INFORMATION/HISTORY: 58 year old female with an underlying complex medical history of diabetes, neuropathy, endometrial cancer, history of gastroparesis, reflux disease, Barrett's esophagus, hiatal hernia, vitamin D deficiency, hypertension, hyperlipidemia, alcohol use disorder (per chart review and confirmed by patient, sober for over 20 years), prior smoking, allergies, anxiety, depression, and morbid obesity with a BMI of over 50, who reports snoring and excessive daytime somnolence as well as witnessed apneas and waking up with headaches.  She was previously diagnosed with obstructive sleep apnea and was placed on PAP therapy but is no longer on treatment with a positive airway pressure device.   Epworth sleepiness score: 5/24.  BMI: 51.5 kg/m  FINDINGS:   Sleep Summary:   Total Recording Time (hours, min): 9 hours, 50 min  Total Sleep Time (hours, min):  7 hours, 6 min  Percent REM (%):    7.9%   Respiratory Indices:   Calculated pAHI (per hour):  51.2/hour         REM pAHI:    58.1/hour       NREM pAHI: 50.6/hour  Central pAHI: 5.5/hour  Oxygen Saturation Statistics:    Oxygen Saturation (%) Mean: 91%   Minimum oxygen saturation (%):                 65%   O2 Saturation Range (%): 65-97%    O2 Saturation (minutes) <=88%: 19.2 min  Pulse Rate Statistics:   Pulse Mean (bpm):    72/min    Pulse Range (48-121/min)   IMPRESSION:  OSA (obstructive sleep apnea), severe Nocturnal Hypoxemia  RECOMMENDATION:  This home sleep test demonstrates severe obstructive sleep apnea with a total AHI of 51.5/hour and O2 nadir of 65% with significant time below or at 88% saturation of over 19 minutes for the study, indicating nocturnal hypoxemia. Snoring was  detected, in the loud range fairly consistently throughout the study, at times in the moderate range.  Urgent treatment with positive airway pressure is highly recommended. The patient will be advised to proceed with an autoPAP titration/trial at home. While waiting for autoPAP set up at home, I recommend patient sleep with HOB mildly elevated between 30-45 degrees and primarily sleep on her sides.  Ongoing efforts to lose weight is also highly recommended.   A laboratory attended titration study can be considered in the future for optimization of treatment settings and to improve tolerance and compliance, if needed, down the road. Alternative treatment options are limited secondary to the severity of the patient's sleep disordered breathing, but may include surgical treatment with an implantable hypoglossal nerve stimulator (in carefully selected candidates, meeting criteria).  Concomitant weight loss is recommended (where clinically appropriate). Please note, that untreated obstructive sleep apnea may carry additional perioperative morbidity. Patients with significant obstructive sleep apnea should receive perioperative PAP therapy and the surgeons and particularly the anesthesiologist should be informed of the diagnosis and the severity of the sleep disordered breathing. The patient should be cautioned not to drive, work at heights, or operate dangerous or heavy equipment when tired or sleepy. Review and reiteration of good sleep hygiene measures should be pursued with any patient. Other causes of the patient's symptoms, including circadian rhythm disturbances, an underlying mood disorder, medication effect and/or an underlying medical problem cannot be ruled out based on this test.  Clinical correlation is recommended.  The patient and her referring provider will be notified of the test results. The patient will be seen in follow up in sleep clinic at [facility].  I certify that I have reviewed the raw data  recording prior to the issuance of this report in accordance with the standards of the American Academy of Sleep Medicine (AASM).    INTERPRETING PHYSICIAN:   Huston Foley, MD, PhD Medical Director, [facility]) Diplomat, ABPN (Neurology and Sleep)   [facility] [address], [address] [place], [place] [address] [phone]

## 2023-02-09 NOTE — Addendum Note (Signed)
Addended by: Huston Foley on: 02/09/2023 12:42 PM   Modules accepted: Orders

## 2023-02-12 ENCOUNTER — Ambulatory Visit (INDEPENDENT_AMBULATORY_CARE_PROVIDER_SITE_OTHER): Payer: Medicaid Other | Admitting: Mental Health

## 2023-02-12 ENCOUNTER — Encounter (HOSPITAL_COMMUNITY): Payer: Self-pay

## 2023-02-12 DIAGNOSIS — F4310 Post-traumatic stress disorder, unspecified: Secondary | ICD-10-CM

## 2023-02-12 DIAGNOSIS — F321 Major depressive disorder, single episode, moderate: Secondary | ICD-10-CM

## 2023-02-12 NOTE — Assessment & Plan Note (Signed)
Lipitor 40 mg was started in November for patient given her elevated cholesterol and diabetes.  She got a 90-day supply with refills and does not need a refill today.

## 2023-02-12 NOTE — Progress Notes (Unsigned)
THERAPIST PROGRESS NOTE Virtual Visit via Video Note  I connected with Tara Keller on 02/12/2023 at  4:00 PM EST by a video enabled telemedicine application and verified that I am speaking with the correct person using two identifiers.  Location: Patient: home address on file Provider: home office   I discussed the limitations of evaluation and management by telemedicine and the availability of in person appointments. The patient expressed understanding and agreed to proceed.  I discussed the assessment and treatment plan with the patient. The patient was provided an opportunity to ask questions and all were answered. The patient agreed with the plan and demonstrated an understanding of the instructions.   The patient was advised to call back or seek an in-person evaluation if the symptoms worsen or if the condition fails to improve as anticipated.  I provided 53 minutes of non-face-to-face time during this encounter.   Tara Keller, [facility]   Session Time: 4: 02 pm ( 53 minutes)  Participation Level: Active  Behavioral Response: CasualAlertDepressed and Dysphoric  Type of Therapy: Individual Therapy  Treatment Goals addressed: STG: "Work on me." Tara Keller will decrease depressive sxs AEB ability process thoughts in healthy manner per self report with engagement in enjoyable activities weekly for a period of 90 days.   ProgressTowards Goals: Initial  Interventions: Supportive  Summary: Tara Keller is a 58 y.o. female who presents with x of major depression severe and PTSD. Presents for session alert and oriented; mood and affect depressive, low. Tearful at times. Speech clear and coherent at normal raate and tone. Engaged and reeptive to interventions. Shares chief complaint of low mood sharing for this month to be her deceased daughter's birthday month. Shares to have experienced high degree of loss in her life and shares onging feelings of grief dating back to brothers  death in the 56's and shares to have seen his decease body. Notes for daughter to have passe din 2017. Shares distant relationship with daughters, despite x 1 daughter living in her neighborhood. Notes for daughter to hold her past against her sharing to have made mistakes. Notes feelings of anger and shares dislikes to be in public nothing hypervigilance sxs. Shares can have anger outburst 3 to 4 times a month if she feel she is provoked. Denies hobbies or things she enjoys outside of seeing her grand-children and shares event of attending a bounce house, " I had to sit with my back against the wall." Shares to be open to explore options for community engagement and agrees to explore [facility]. Agrees to treatment plan.    Suicidal/Homicidal: Nowithout intent/plan  Therapist Response: Therapist engaged Tara Keller in [employer]. Assessed for current location and ability to hold confidential session. Engaged in rapport building and exploring current concerns and level of sxs. Provided safe space to share thoughts and feelings in regards to feeling of depression, grief and past traumatic events. Provided support and encouragement; active empathic listening. Validated feelings. Educated on PTSD diagnoses and sxs associated with diagnoses as well and psycho-education on grief. Explored current routine and engagement in hobbies and things in which she enjoys. Explored ways in which MH has effected her daily life. Explored desired progress and explored treatment plan. Provided information on local adult center as well as various events at local recreation centers. Reviewed session and provided follow up.   Plan: Return again in  x 6 weeks.  Diagnosis: Episode of moderate major depression (HCC)  PTSD (post-traumatic stress disorder)  Collaboration of Care: Other None  Patient/Guardian was advised Release of Information must be obtained prior to any record release in order to collaborate their  care with an outside provider. Patient/Guardian was advised if they have not already done so to contact the registration department to sign all necessary forms in order for [name] to release information regarding their care.   Consent: Patient/Guardian gives verbal consent for treatment and assignment of benefits for services provided during this visit. Patient/Guardian expressed understanding and agreed to proceed.   Tara Keller, [facility] 02/12/2023

## 2023-02-12 NOTE — Assessment & Plan Note (Signed)
Patient is on prescription strength vitamin D and reported no side effects of starting this.  She is noticing a slight improvement in her energy.  She needs a refill of it today.

## 2023-02-13 ENCOUNTER — Encounter: Payer: Self-pay | Admitting: Psychiatry

## 2023-02-13 ENCOUNTER — Telehealth: Payer: Self-pay

## 2023-02-13 NOTE — Telephone Encounter (Addendum)
 Suraiya Dickerson D, CMA  Joylene Bradley; Garcia, Patricia; Ziegler, Melissa; Tucker, Dolanda; Cain, [place] New orders have been placed for the above pt, DOB: May 26, 2065 Thanks  New, Adine Samuel, Stephania BIRCH, CMA; New, Bradley; Garcia, Patricia; Ziegler, Melissa; Tucker, Dolanda; 1 other Received, thank you!

## 2023-02-13 NOTE — Telephone Encounter (Signed)
-----   Message from True Mar sent at 02/09/2023 12:42 PM EST ----- Urgent set up requested on PAP therapy, due to severe OSA. Patient referred by Dr. Berkeley from medical weight management, seen by me on 12/20/2022, patient had a HST on 02/06/2023.    Please call and notify the patient that the recent home sleep test showed obstructive sleep apnea in the severe range. I recommend treatment for this in the form of autoPAP, which means, that we don't have to bring her in for a sleep study with CPAP, but will let her start using a so called autoPAP machine at home, through a DME company (of her choice, or as per insurance requirement). The DME representative will fit the patient with a mask of choice, educate her on how to use the machine, how to put the mask on, etc. I have placed an order in the chart. Please send the order to a local DME, talk to patient, send report to referring MD. Please also reinforce the need for compliance with treatment. We will need a FU in sleep clinic for 10 weeks post-PAP set up, please arrange that with me or one of our NPs.  While waiting for autoPAP set up at home, I recommend patient sleep with HOB mildly elevated between 30-45 degrees and primarily sleep on her sides.  Ongoing efforts to lose weight are highly recommended.    Thanks,   True Mar, MD, PhD [name])

## 2023-02-13 NOTE — Telephone Encounter (Signed)
 I called pt. I advised pt that Dr. Buck reviewed their sleep study results and found that pt has sever OSA. Dr. Buck recommends that pt starts autopap. I reviewed PAP compliance expectations with the pt. Pt is agreeable to starting a CPAP. I advised pt that an order will be sent to a DME, [employer], and [employer] will call the pt within about one week after they file with the pt's insurance. [employer] will show the pt how to use the machine, fit for masks, and troubleshoot the CPAP if needed. A follow up appt was made for insurance purposes with JM 03/26/23 115 Pt verbalized understanding to arrive 15 minutes early and bring their CPAP. Pt verbalized understanding of results. Pt had no questions at this time but was encouraged to call back if questions arise. I have sent the order to [employer] and have received confirmation that they have received the order.

## 2023-02-15 ENCOUNTER — Ambulatory Visit (INDEPENDENT_AMBULATORY_CARE_PROVIDER_SITE_OTHER): Payer: MEDICAID | Admitting: Family Medicine

## 2023-02-15 VITALS — BP 117/68 | HR 75 | Temp 98.2°F | Resp 16 | Wt 312.0 lb

## 2023-02-15 DIAGNOSIS — I10 Essential (primary) hypertension: Secondary | ICD-10-CM

## 2023-02-15 DIAGNOSIS — Z6841 Body Mass Index (BMI) 40.0 and over, adult: Secondary | ICD-10-CM | POA: Diagnosis not present

## 2023-02-15 DIAGNOSIS — G4730 Sleep apnea, unspecified: Secondary | ICD-10-CM | POA: Diagnosis not present

## 2023-02-15 DIAGNOSIS — E1142 Type 2 diabetes mellitus with diabetic polyneuropathy: Secondary | ICD-10-CM | POA: Diagnosis not present

## 2023-02-15 DIAGNOSIS — Z794 Long term (current) use of insulin: Secondary | ICD-10-CM

## 2023-02-20 ENCOUNTER — Telehealth: Payer: Self-pay | Admitting: Family Medicine

## 2023-02-20 ENCOUNTER — Encounter: Payer: Self-pay | Admitting: Family Medicine
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
## 2023-02-20 NOTE — Telephone Encounter (Signed)
Patient was identified as falling into the [place] Measure - Diabetes.   Patient was: Appointment scheduled for lab or office visit for A1c.

## 2023-02-26 ENCOUNTER — Encounter (INDEPENDENT_AMBULATORY_CARE_PROVIDER_SITE_OTHER): Payer: Self-pay | Admitting: Family Medicine

## 2023-02-26 ENCOUNTER — Ambulatory Visit (INDEPENDENT_AMBULATORY_CARE_PROVIDER_SITE_OTHER): Payer: MEDICAID | Admitting: Family Medicine

## 2023-02-26 VITALS — BP 112/71 | HR 81 | Temp 97.7°F | Ht 66.0 in | Wt 312.0 lb

## 2023-02-26 DIAGNOSIS — F321 Major depressive disorder, single episode, moderate: Secondary | ICD-10-CM

## 2023-02-26 DIAGNOSIS — Z7984 Long term (current) use of oral hypoglycemic drugs: Secondary | ICD-10-CM

## 2023-02-26 DIAGNOSIS — Z794 Long term (current) use of insulin: Secondary | ICD-10-CM

## 2023-02-26 DIAGNOSIS — E559 Vitamin D deficiency, unspecified: Secondary | ICD-10-CM | POA: Diagnosis not present

## 2023-02-26 DIAGNOSIS — E1142 Type 2 diabetes mellitus with diabetic polyneuropathy: Secondary | ICD-10-CM

## 2023-02-26 DIAGNOSIS — Z6841 Body Mass Index (BMI) 40.0 and over, adult: Secondary | ICD-10-CM

## 2023-02-26 DIAGNOSIS — E1169 Type 2 diabetes mellitus with other specified complication: Secondary | ICD-10-CM

## 2023-02-26 MED ORDER — VITAMIN D (ERGOCALCIFEROL) 1.25 MG (50000 UNIT) PO CAPS: 50000.0000 [IU] | ORAL_CAPSULE | ORAL | 0 refills | Status: DC

## 2023-02-26 MED ORDER — TOPIRAMATE 25 MG PO TABS: 25.0000 mg | ORAL_TABLET | Freq: Every day | ORAL | 0 refills | Status: DC

## 2023-02-26 NOTE — Progress Notes (Signed)
 SUBJECTIVE:  Chief Complaint: Obesity  Interim History: Patient went to a CPAP class since her last appointment as she was diagnosed with severe sleep apnea.  She is getting all the food in during the day but is very hungry at night.  She is looking for food options that she needs to eat.  She is interested in what food options exactly she can take in.  Tara Keller is here to discuss her progress with her obesity treatment plan. She is on the Category 2 Plan and states she is following her eating plan approximately 75-80 % of the time. She states she is exercising 10-15 minutes 5 times per week.   OBJECTIVE: Visit Diagnoses: Problem List Items Addressed This Visit       Endocrine   Type 2 diabetes mellitus with diabetic polyneuropathy, with long-term current use of insulin (HCC)   Patient brought blood sugar log today- fasting blood sugars 114 to 208.  High blood sugar of 290 at bedtime.        Relevant Medications   topiramate (TOPAMAX) 25 MG tablet     Other   Morbid obesity (HCC)   Episode of moderate major depression (HCC) - Primary   Patient is having emotional eating in the evening and experiencing cravings and desires for higher carbohydrate foods.  We discussed various options in terms of medication management and patient is agreeable to try topiramate.  We discussed possible side effects which include but are not limited to mental clouding, somnolence, taste changes, tingling in extremities.  She is agreeable to try medication for the next 3 weeks and then discuss effects at next appointment.  She understands if she has any side effects to reach out to our office or call 911 if it is an emergency.      Relevant Medications   topiramate (TOPAMAX) 25 MG tablet   Vitamin D deficiency   Doing well on prescription strength Vitamin D and needs a refill today.  No nausea, vomiting, muscle weakness or any other side effects noted.      Relevant Medications   Vitamin D,  Ergocalciferol, (DRISDOL) 1.25 MG (50000 UNIT) CAPS capsule   Other Visit Diagnoses       BMI 50.0-59.9, adult (HCC)             03/04/2023    2:12 PM 03/03/2023    4:12 PM 02/26/2023    3:00 PM  Vitals with BMI  Height   5\' 6"   Weight   312 lbs  BMI   50.38  Systolic 142 106 161  Diastolic 73 70 71  Pulse 84 85 81    No data recorded  No data recorded  No data recorded  No data recorded    ASSESSMENT AND PLAN:  Diet: Sakshi is currently in the action stage of change. As such, her goal is to continue with weight loss efforts. She has agreed to Category 3 Plan.  Exercise: Karmen has been instructed that some exercise is better than none for weight loss and overall health benefits.   Behavior Modification:  We discussed the following Behavioral Modification Strategies today: increasing lean protein intake, meal planning and cooking strategies, keeping healthy foods in the home, ways to avoid nighttime snacking, and emotional eating strategies . We discussed various medication options to help Tara Keller with her weight loss efforts and we both agreed to start topiramate to help with nighttime snacking.  No follow-ups on file.[name] She was informed of the importance of frequent  follow up visits to maximize her success with intensive lifestyle modifications for her multiple health conditions.  Attestation Statements:   Reviewed by clinician on day of visit: allergies, medications, problem list, medical history, surgical history, family history, social history, and previous encounter notes.     Reuben Likes, MD

## 2023-02-26 NOTE — Assessment & Plan Note (Signed)
Patient brought blood sugar log today- fasting blood sugars 114 to 208.  High blood sugar of 290 at bedtime.

## 2023-02-27 ENCOUNTER — Telehealth: Payer: Self-pay | Admitting: Neurology

## 2023-02-27 DIAGNOSIS — G4733 Obstructive sleep apnea (adult) (pediatric): Secondary | ICD-10-CM

## 2023-02-27 NOTE — Telephone Encounter (Signed)
We can reduce AutoPap to 4 to 12 cm.  Order placed.

## 2023-02-27 NOTE — Telephone Encounter (Signed)
I called the pt. She said last night is the first night she used it. She said at first she had a leak with her mask but then "got it on right". When she woke up this morning the pressure was too high. I told her we are waiting to see the data and it may be that Dr Frances Furbish needs to see more nights of usage before we can make a meaningful recommendation in pressure change but we will see what data is available from last night and then call her back. I encouraged the patient to try to continue using it nightly at least 4 hours. She thanked me for the call.

## 2023-02-27 NOTE — Telephone Encounter (Signed)
Called pt and let her know autopap pressure is being reduced by Dr Frances Furbish. Should take effect tonight. Pt very appreciative.  Pressure setting changed in resmed to 4-12 instead of 7-14. Order sent to [facility] for their records.

## 2023-02-27 NOTE — Telephone Encounter (Signed)
 [name]

## 2023-02-27 NOTE — Telephone Encounter (Signed)
Called [facility] and asked for them to tag pt in airview asap.

## 2023-02-27 NOTE — Telephone Encounter (Signed)
Pt called wanting to know if the pressure can be decreased on her Cpap machine. Please advise.

## 2023-02-28 NOTE — Telephone Encounter (Addendum)
I called the patient and let her know that she will need to take her machine to [facility] to have it looked at. Patient verbalized understanding. She states she used it 2 hours last night and then when she got up to use the restroom it got knocked off her nightstand. She has a mask refit appt tomorrow. She will call today to see if she can move her appt up. She thanked me for the call.

## 2023-02-28 NOTE — Telephone Encounter (Signed)
Pt reports that her CPAP fell and is now not putting out any pressure.  Pt is concerned and would like a call to discuss.

## 2023-03-01 NOTE — Telephone Encounter (Signed)
 New, Maryella Shivers, Otilio Jefferson, RN; Alain Honey; Jeris Penta, [place]; 1 other Received, thank you!

## 2023-03-03 ENCOUNTER — Ambulatory Visit
Admission: EM | Admit: 2023-03-03 | Discharge: 2023-03-03 | Disposition: A | Discharge status: Home / Self Care | Payer: MEDICAID | Attending: Family Medicine | Admitting: Family Medicine

## 2023-03-03 ENCOUNTER — Encounter: Payer: Self-pay | Admitting: Emergency Medicine

## 2023-03-03 ENCOUNTER — Other Ambulatory Visit: Payer: Self-pay

## 2023-03-03 DIAGNOSIS — J069 Acute upper respiratory infection, unspecified: Secondary | ICD-10-CM | POA: Insufficient documentation

## 2023-03-03 DIAGNOSIS — R070 Pain in throat: Secondary | ICD-10-CM | POA: Insufficient documentation

## 2023-03-03 LAB — POC COVID19/FLU A&B COMBO
Covid Antigen, POC: NEGATIVE
Influenza A Antigen, POC: NEGATIVE
Influenza B Antigen, POC: NEGATIVE

## 2023-03-03 LAB — POCT RAPID STREP A (OFFICE): Rapid Strep A Screen: NEGATIVE

## 2023-03-03 MED ORDER — IBUPROFEN 600 MG PO TABS: 600.0000 mg | ORAL_TABLET | Freq: Three times a day (TID) | ORAL | 0 refills | Status: AC | PRN

## 2023-03-03 MED ORDER — BENZONATATE 100 MG PO CAPS: 100.0000 mg | ORAL_CAPSULE | Freq: Three times a day (TID) | ORAL | 0 refills | Status: DC | PRN

## 2023-03-03 NOTE — Discharge Instructions (Addendum)
 Your strep test is negative.  Culture of the throat will be sent, and staff will notify you if that is in turn positive.  The test for flu and COVID is negative also.  Please take Tylenol and use Cepacol spray for your throat pain.   Take benzonatate 100 mg, 1 tab every 8 hours as needed for cough.  Take ibuprofen 600 mg--1 tab every 8 hours as needed for pain.  Do not take this with the meloxicam   You can use your CPAP this evening as long as you can tolerate it with the congestion.  You can use some saline nasal spray to rinse out your nose
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
## 2023-03-03 NOTE — ED Triage Notes (Signed)
 Seen by provider prior to this nurse

## 2023-03-04 ENCOUNTER — Encounter (HOSPITAL_COMMUNITY): Payer: Self-pay | Admitting: Emergency Medicine

## 2023-03-04 ENCOUNTER — Other Ambulatory Visit: Payer: Self-pay

## 2023-03-04 ENCOUNTER — Ambulatory Visit (HOSPITAL_COMMUNITY)
Admission: EM | Admit: 2023-03-04 | Discharge: 2023-03-04 | Disposition: A | Discharge status: Home / Self Care | Payer: MEDICAID | Attending: Physician Assistant | Admitting: Physician Assistant

## 2023-03-04 DIAGNOSIS — J069 Acute upper respiratory infection, unspecified: Secondary | ICD-10-CM | POA: Diagnosis not present

## 2023-03-04 MED ORDER — ALBUTEROL SULFATE HFA 108 (90 BASE) MCG/ACT IN AERS: 1.0000 | INHALATION_SPRAY | Freq: Four times a day (QID) | RESPIRATORY_TRACT | 0 refills | Status: AC | PRN

## 2023-03-04 NOTE — ED Triage Notes (Signed)
 Pt states she is having persistent cough over a week. She was seen yesterday on UC states she is getting worse today with the cough and is making her throat to hurt.
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
## 2023-03-04 NOTE — Discharge Instructions (Addendum)
 Based on your described symptoms and the duration of symptoms it is likely that you have a viral upper respiratory infection (often called a "cold")  Symptoms can last for 3-10 days with lingering cough and intermittent symptoms lasting weeks after that.  The goal of treatment at this time is to reduce your symptoms and discomfort   I recommend using Robitussin and Mucinex (regular formulations, nothing with decongestants or DM)  You can also use Tylenol for body aches and fever reduction I also recommend adding an antihistamine to your daily regimen This includes medications like Claritin, Allegra, Zyrtec- the generics of these work very well and are usually less expensive I recommend using Flonase nasal spray - 2 puffs twice per day to help with your nasal congestion The antihistamines and Flonase can take a few weeks to provide significant relief from allergy symptoms but should start to provide some benefit soon. You can use a humidifier at night to help with preventing nasal dryness and irritation   I have sent in a refill of your albuterol inhaler for you to use.  This can help with shortness of breath and severe coughing spells.  Please use as needed up to every 6 hours.  Please follow-up with your primary care doctor if your symptoms are persisting for more than 7 to 10 days. Go to the ER if you begin to have more serious symptoms such as shortness of breath, trouble breathing, loss of consciousness, swelling around the eyes, high fever, severe lasting headaches, vision changes or neck pain/stiffness.

## 2023-03-05 ENCOUNTER — Telehealth: Payer: Self-pay | Admitting: Adult Health

## 2023-03-05 LAB — CULTURE, GROUP A STREP (THRC)

## 2023-03-05 NOTE — Telephone Encounter (Signed)
 Called the patient back. Her set up date was 02/10/2023 and she is scheduled 03/26/23 for a follow up visit. I have pushed that apt out until 05/07/2023 at 1:15 pm to allow her more time to start using the CPAP. She is currently receiving treatment for sinus infection and because she is unable to breathe in and out of her nose she has not been able to use the machine. Advised to make sure cleans the machine really well before she starts to use it. She verbalized understanding and was appreciative for the call back.

## 2023-03-05 NOTE — Telephone Encounter (Signed)
 Patient has not been able to use her new machine because she is sick with sinus issues and now in her throat. She wanted to see what she could do and if her appointment needs to be changed for March.

## 2023-03-07 ENCOUNTER — Telehealth: Payer: Self-pay

## 2023-03-07 MED ORDER — AZITHROMYCIN 250 MG PO TABS: ORAL_TABLET | ORAL | 0 refills | Status: AC

## 2023-03-07 NOTE — Telephone Encounter (Signed)
 TC to pt, who reports continued symptoms.  Per protocol, pt can receive treatment with Azithromycin.  Reviewed with patient, verified pharmacy, prescription sent.

## 2023-03-11 NOTE — Assessment & Plan Note (Signed)
 Patient is having emotional eating in the evening and experiencing cravings and desires for higher carbohydrate foods.  We discussed various options in terms of medication management and patient is agreeable to try topiramate.  We discussed possible side effects which include but are not limited to mental clouding, somnolence, taste changes, tingling in extremities.  She is agreeable to try medication for the next 3 weeks and then discuss effects at next appointment.  She understands if she has any side effects to reach out to our office or call 911 if it is an emergency.

## 2023-03-11 NOTE — Assessment & Plan Note (Signed)
 Doing well on prescription strength Vitamin D and needs a refill today.  No nausea, vomiting, muscle weakness or any other side effects noted.

## 2023-03-13 ENCOUNTER — Ambulatory Visit
Admission: RE | Admit: 2023-03-13 | Discharge: 2023-03-13 | Disposition: A | Discharge status: Home / Self Care | Payer: MEDICAID | Source: Ambulatory Visit | Attending: Internal Medicine | Admitting: Internal Medicine

## 2023-03-13 ENCOUNTER — Other Ambulatory Visit: Payer: Self-pay

## 2023-03-13 VITALS — BP 120/76 | HR 69 | Temp 97.7°F | Resp 18

## 2023-03-13 DIAGNOSIS — R053 Chronic cough: Secondary | ICD-10-CM

## 2023-03-13 DIAGNOSIS — J069 Acute upper respiratory infection, unspecified: Secondary | ICD-10-CM

## 2023-03-13 MED ORDER — AMOXICILLIN-POT CLAVULANATE 875-125 MG PO TABS: 1.0000 | ORAL_TABLET | Freq: Two times a day (BID) | ORAL | 0 refills | Status: AC

## 2023-03-13 NOTE — ED Triage Notes (Signed)
 Pt says she has chest congestion and cough that started initially on 03/04/23. She states the congestion is stopping her from using her CPAP machine. Pt states she feels there is mucus in her lungs. Tried Mucinex but it made her heart race so she is afraid to take it again.

## 2023-03-13 NOTE — Discharge Instructions (Signed)
 I have prescribed you an antibiotic for symptoms.  Follow-up if any symptoms persist or worsen.
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
## 2023-03-15 NOTE — Telephone Encounter (Signed)
 Pt called in again wanting to let us know that she tried using her machine the past 2 nights and hasn't been able to because she still isn't over her sinus infection. She has 10 more pills left to take of her antibiotic. Just an FYI to explain why she hasn't been using machine.

## 2023-03-16 ENCOUNTER — Other Ambulatory Visit (HOSPITAL_COMMUNITY): Payer: Self-pay

## 2023-03-21 ENCOUNTER — Ambulatory Visit
Admission: RE | Admit: 2023-03-21 | Discharge: 2023-03-21 | Disposition: A | Discharge status: Home / Self Care | Payer: MEDICAID | Source: Ambulatory Visit | Attending: Physician Assistant | Admitting: Physician Assistant

## 2023-03-21 ENCOUNTER — Other Ambulatory Visit: Payer: Self-pay

## 2023-03-21 VITALS — BP 116/77 | HR 88 | Temp 98.4°F | Resp 18

## 2023-03-21 DIAGNOSIS — T3695XA Adverse effect of unspecified systemic antibiotic, initial encounter: Secondary | ICD-10-CM

## 2023-03-21 DIAGNOSIS — N760 Acute vaginitis: Secondary | ICD-10-CM

## 2023-03-21 DIAGNOSIS — B379 Candidiasis, unspecified: Secondary | ICD-10-CM

## 2023-03-21 MED ORDER — FLUCONAZOLE 150 MG PO TABS: 150.0000 mg | ORAL_TABLET | ORAL | 0 refills | Status: AC

## 2023-03-21 NOTE — ED Triage Notes (Signed)
 Vaginal itching x 4 days. States it started after taking an antibiotic. Requesting med for yeast infection
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
## 2023-03-21 NOTE — Discharge Instructions (Signed)
 We are treating you for a yeast infection.  Take Diflucan every 3 days for up to 3 doses.  Wear loosefitting cotton underwear and use hypoallergenic soaps and detergents.  Make sure you are drinking plenty of fluid.  If your symptoms are not improving within a few days of starting this medication or if anything worsens and you develop abnormal discharge, pelvic pain, abdominal pain, fever, nausea, vomiting you need to be seen immediately.

## 2023-03-22 NOTE — Telephone Encounter (Signed)
 Contacted pt regarding appt Monday. She is non compliant at this time due to being sick. Set Up date 2/17. Appt has been pushed back to 4/28

## 2023-03-22 NOTE — Telephone Encounter (Signed)
 Noted! Thank you

## 2023-03-26 ENCOUNTER — Other Ambulatory Visit: Payer: MEDICAID | Admitting: Family Medicine

## 2023-03-26 ENCOUNTER — Encounter: Payer: MEDICAID | Admitting: Adult Health

## 2023-03-27 ENCOUNTER — Encounter: Payer: Self-pay | Admitting: Internal Medicine

## 2023-03-27 ENCOUNTER — Other Ambulatory Visit (HOSPITAL_COMMUNITY): Payer: Self-pay

## 2023-03-27 ENCOUNTER — Ambulatory Visit (INDEPENDENT_AMBULATORY_CARE_PROVIDER_SITE_OTHER): Payer: MEDICAID | Admitting: Internal Medicine

## 2023-03-27 ENCOUNTER — Telehealth: Payer: Self-pay

## 2023-03-27 ENCOUNTER — Telehealth: Payer: Self-pay | Admitting: Internal Medicine

## 2023-03-27 VITALS — BP 122/68 | HR 75 | Ht 66.0 in | Wt 317.0 lb

## 2023-03-27 DIAGNOSIS — Z794 Long term (current) use of insulin: Secondary | ICD-10-CM

## 2023-03-27 DIAGNOSIS — E785 Hyperlipidemia, unspecified: Secondary | ICD-10-CM | POA: Diagnosis not present

## 2023-03-27 DIAGNOSIS — E1169 Type 2 diabetes mellitus with other specified complication: Secondary | ICD-10-CM | POA: Diagnosis not present

## 2023-03-27 DIAGNOSIS — E1142 Type 2 diabetes mellitus with diabetic polyneuropathy: Secondary | ICD-10-CM | POA: Diagnosis not present

## 2023-03-27 DIAGNOSIS — E1165 Type 2 diabetes mellitus with hyperglycemia: Secondary | ICD-10-CM | POA: Diagnosis not present

## 2023-03-27 LAB — POCT GLYCOSYLATED HEMOGLOBIN (HGB A1C): Hemoglobin A1C: 8.6 % — AB (ref 4.0–5.6)

## 2023-03-27 MED ORDER — DEXCOM G7 SENSOR MISC: 1.0000 | 3 refills | Status: DC

## 2023-03-27 MED ORDER — LISINOPRIL 10 MG PO TABS: ORAL_TABLET | ORAL | 3 refills | Status: AC

## 2023-03-27 MED ORDER — DAPAGLIFLOZIN PROPANEDIOL 10 MG PO TABS: 10.0000 mg | ORAL_TABLET | Freq: Every day | ORAL | 3 refills | Status: DC

## 2023-03-27 NOTE — Telephone Encounter (Signed)
 Pharmacy Patient Advocate Encounter  Received notification from [facility] that Prior Authorization for Dexcom G7 sensor has been APPROVED through 09/27/2023   PA #/Case ID/Reference #: [id number]
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
## 2023-03-27 NOTE — Telephone Encounter (Signed)
 Pharmacy Patient Advocate Encounter   Received notification from Pt Calls Messages that prior authorization for Dexcom G7 sensor is required/requested.   Insurance verification completed.   The patient is insured through [facility] .   Per test claim: PA required; PA submitted to above mentioned insurance via CoverMyMeds Key/confirmation #/EOC [id number] Status is pending

## 2023-03-27 NOTE — Telephone Encounter (Signed)
 Requested medication (s) are due for refill today: routing for review  Requested medication (s) are on the active medication list: yes  Last refill:  03/04/23  Future visit scheduled: no  Notes to clinic:  Unable to refill per protocol, last refill by another provider. Routing to PCP for review.     Requested Prescriptions  Pending Prescriptions Disp Refills   VENTOLIN HFA 108 (90 Base) MCG/ACT inhaler [Pharmacy Med Name: VENTOLIN HFA INH W/DOS CTR 200PUFFS] 18 g     Sig: INHALE 1 TO 2 PUFFS INTO THE LUNGS EVERY 6 HOURS AS NEEDED FOR WHEEZING OR SHORTNESS OF BREATH     Pulmonology:  Beta Agonists 2 Passed - 03/27/2023  2:59 PM      Passed - Last BP in normal range    BP Readings from Last 1 Encounters:  03/27/23 122/68         Passed - Last Heart Rate in normal range    Pulse Readings from Last 1 Encounters:  03/27/23 75         Passed - Valid encounter within last 12 months    Recent Outpatient Visits           1 month ago Type 2 diabetes mellitus with diabetic polyneuropathy, with long-term current use of insulin (HCC)   [facility], MD   2 months ago Dysuria   [facility], Lauris Poag, MD   4 months ago Type 2 diabetes mellitus with other specified complication, with long-term current use of insulin [facility])   [facility], Lauris Poag, MD   5 months ago Type 2 diabetes mellitus with diabetic polyneuropathy, with long-term current use of insulin (HCC)   [facility], Lauris Poag, MD   9 months ago Type 2 diabetes mellitus with other specified complication, with long-term current use of insulin [facility])   [facility], MD

## 2023-03-27 NOTE — Telephone Encounter (Signed)
 Can you please proceed with PA for dexcom ?   Thanks

## 2023-03-27 NOTE — Patient Instructions (Addendum)
-   Continue Metformin 750 mg to 1 tablet TWICE a day  - Continue Farxiga 10 mg daily  - Continue Lantus  84 units ONCE a day  - Take  Novolog 10-14 units with each meal (if sugar is less than 150, take 10 units with the meal, if your sugar is over 150, take 14 units with a meal)   - Novolog correctional insulin: ADD extra units on insulin to your meal-time Novolog dose if your blood sugars are higher than 155. Use the scale below to help guide you:   Blood sugar before meal Number of units to inject  Less than 155 0 unit  156 - 180 1 units  181 - 205 2 units  206 - 230 3 units  231 - 255 4 units  256 - 280 5 units  281 - 305 6 units  306 - 330 7 units  331- 355 8 units      HOW TO TREAT LOW BLOOD SUGARS (Blood sugar LESS THAN 70 MG/DL) Please follow the RULE OF 15 for the treatment of hypoglycemia treatment (when your (blood sugars are less than 70 mg/dL)   STEP 1: Take 15 grams of carbohydrates when your blood sugar is low, which includes:  3-4 GLUCOSE TABS  OR 3-4 OZ OF JUICE OR REGULAR SODA OR ONE TUBE OF GLUCOSE GEL    STEP 2: RECHECK blood sugar in 15 MINUTES STEP 3: If your blood sugar is still low at the 15 minute recheck --> then, go back to STEP 1 and treat AGAIN with another 15 grams of carbohydrates.

## 2023-03-28 NOTE — Telephone Encounter (Signed)
 Patient called back and said the pharmacy said they can't dispense until they talk to the doctors office about the instructions on the Dexcom. Please advise

## 2023-03-29 ENCOUNTER — Encounter (INDEPENDENT_AMBULATORY_CARE_PROVIDER_SITE_OTHER): Payer: Self-pay | Admitting: Family Medicine

## 2023-03-29 ENCOUNTER — Ambulatory Visit (INDEPENDENT_AMBULATORY_CARE_PROVIDER_SITE_OTHER): Payer: MEDICAID | Admitting: Family Medicine

## 2023-03-29 VITALS — BP 106/72 | HR 78 | Temp 97.9°F | Ht 66.0 in | Wt 313.0 lb

## 2023-03-29 DIAGNOSIS — E1165 Type 2 diabetes mellitus with hyperglycemia: Secondary | ICD-10-CM

## 2023-03-29 DIAGNOSIS — E785 Hyperlipidemia, unspecified: Secondary | ICD-10-CM | POA: Diagnosis not present

## 2023-03-29 DIAGNOSIS — Z794 Long term (current) use of insulin: Secondary | ICD-10-CM | POA: Diagnosis not present

## 2023-03-29 DIAGNOSIS — G4733 Obstructive sleep apnea (adult) (pediatric): Secondary | ICD-10-CM

## 2023-03-29 DIAGNOSIS — E559 Vitamin D deficiency, unspecified: Secondary | ICD-10-CM

## 2023-03-29 DIAGNOSIS — Z6841 Body Mass Index (BMI) 40.0 and over, adult: Secondary | ICD-10-CM

## 2023-03-29 DIAGNOSIS — E669 Obesity, unspecified: Secondary | ICD-10-CM

## 2023-03-29 DIAGNOSIS — E1169 Type 2 diabetes mellitus with other specified complication: Secondary | ICD-10-CM | POA: Diagnosis not present

## 2023-03-29 DIAGNOSIS — F321 Major depressive disorder, single episode, moderate: Secondary | ICD-10-CM

## 2023-03-29 MED ORDER — DEXCOM G7 SENSOR MISC: 1.0000 | 3 refills | Status: DC

## 2023-03-29 MED ORDER — VITAMIN D (ERGOCALCIFEROL) 1.25 MG (50000 UNIT) PO CAPS: 50000.0000 [IU] | ORAL_CAPSULE | ORAL | 0 refills | Status: DC

## 2023-03-29 MED ORDER — ATORVASTATIN CALCIUM 40 MG PO TABS: 40.0000 mg | ORAL_TABLET | Freq: Every day | ORAL | 3 refills | Status: DC

## 2023-03-29 MED ORDER — TOPIRAMATE 25 MG PO TABS: 25.0000 mg | ORAL_TABLET | Freq: Every day | ORAL | 0 refills | Status: DC

## 2023-03-29 NOTE — Assessment & Plan Note (Signed)
 Taking lipitor daily- no side effects mentioned.  Needs refill of lipitor today.

## 2023-03-29 NOTE — Assessment & Plan Note (Signed)
 Patient has been using CPAP more consistently now that she is no longer ill.  Follow up on compliance at next appointment.

## 2023-03-29 NOTE — Progress Notes (Signed)
 SUBJECTIVE:  Chief Complaint: Obesity  Interim History: Patient's friend died two days ago and she is dealing with that.  She mentions she has been monitoring her blood sugars and they have been very labile with her recent illness.  She has had significant chest congestion and mentions she was put on prednisone, cough medicine and could only eat soup and crackers.  She has been using her CPAP machine more consistently since getting it.  Physically she is tired today.   Tara Keller is here to discuss her progress with her obesity treatment plan. She is on the Category 3 Plan and states she is following her eating plan approximately 70 % of the time. She states she is exercising, until she got sick.  OBJECTIVE: Visit Diagnoses: Problem List Items Addressed This Visit       Respiratory   OSA on CPAP   Patient has been using CPAP more consistently now that she is no longer ill.  Follow up on compliance at next appointment.        Endocrine   Type 2 diabetes mellitus with hyperglycemia, with long-term current use of insulin (HCC) - Primary   She is awaiting her Dexcom which is coming in early April.  Patient mentions that her blood sugars have recently been elevated due to being place on prednisone for her URI.  Recent A1c improved to 8.6 (from 9.8!).  She is consistent with monitoring her food intake and is looking for something slightly more straightforward than the Category 3.  Will try low carb plan.  Follow up on blood sugars at next appointment.       Relevant Medications   atorvastatin (LIPITOR) 40 MG tablet   Hyperlipidemia associated with type 2 diabetes mellitus (HCC)   Taking lipitor daily- no side effects mentioned.  Needs refill of lipitor today.      Relevant Medications   atorvastatin (LIPITOR) 40 MG tablet     Other   Episode of moderate major depression (HCC)   Patient doing reasonably on topiramate and needs refill.  Will follow up at next appointment and see if patient  could benefit from 50mg  if snacking and cravings persist.      Relevant Medications   topiramate (TOPAMAX) 25 MG tablet   Vitamin D deficiency   On prescription strength Vitamin D and needs refill- will need to retest level in 2 months to assess absorption.      Relevant Medications   Vitamin D, Ergocalciferol, (DRISDOL) 1.25 MG (50000 UNIT) CAPS capsule    Vitals Temp: 97.9 F (36.6 C) BP: 106/72 Pulse Rate: 78 SpO2: 99 %   Anthropometric Measurements Height: 5\' 6"  (1.676 m) Weight: (!) 313 lb (142 kg) BMI (Calculated): 50.54 Weight at Last Visit: 312  lb Weight Lost Since Last Visit: 0 Weight Gained Since Last Visit: 1 Starting Weight: 311 Total Weight Loss (lbs): 0 lb (0 kg)   Body Composition  Body Fat %: 55.5 % Fat Mass (lbs): 174.2 lbs Muscle Mass (lbs): 132.6 lbs Total Body Water (lbs): 102 lbs Visceral Fat Rating : 22   Other Clinical Data Today's Visit #: 6 Starting Date: 11/22/22 Comments: Cat 3     ASSESSMENT AND PLAN:  Diet: Shali is currently in the action stage of change. As such, her goal is to continue with weight loss efforts and has agreed to following a lower carbohydrate, vegetable and lean protein rich diet plan.   Exercise:  All adults should avoid inactivity. Some activity is better  than none, and adults who participate in any amount of physical activity, gain some health benefits.  Behavior Modification:  We discussed the following Behavioral Modification Strategies today: increasing lean protein intake, decreasing simple carbohydrates, meal planning and cooking strategies, avoiding temptations, and planning for success.   Return in about 4 weeks (around 04/26/2023).[name] She was informed of the importance of frequent follow up visits to maximize her success with intensive lifestyle modifications for her multiple health conditions.  Attestation Statements:   Reviewed by clinician on day of visit: allergies, medications, problem list,  medical history, surgical history, family history, social history, and previous encounter notes.    Reuben Likes, MD

## 2023-03-29 NOTE — Assessment & Plan Note (Signed)
 She is awaiting her Dexcom which is coming in early April.  Patient mentions that her blood sugars have recently been elevated due to being place on prednisone for her URI.  Recent A1c improved to 8.6 (from 9.8!).  She is consistent with monitoring her food intake and is looking for something slightly more straightforward than the Category 3.  Will try low carb plan.  Follow up on blood sugars at next appointment.

## 2023-03-29 NOTE — Assessment & Plan Note (Signed)
 Patient doing reasonably on topiramate and needs refill.  Will follow up at next appointment and see if patient could benefit from 50mg  if snacking and cravings persist.

## 2023-03-29 NOTE — Addendum Note (Signed)
 Addended by: Lisabeth Pick on: 03/29/2023 08:11 AM   Modules accepted: Orders

## 2023-03-29 NOTE — Assessment & Plan Note (Signed)
 On prescription strength Vitamin D and needs refill- will need to retest level in 2 months to assess absorption.

## 2023-04-02 ENCOUNTER — Other Ambulatory Visit: Payer: Self-pay | Admitting: Internal Medicine

## 2023-04-02 MED ORDER — DEXCOM G7 SENSOR MISC: 1.0000 | 3 refills | Status: DC

## 2023-04-02 NOTE — Telephone Encounter (Signed)
 Patient came in the office to follow up on the dexcom, She said the pharmacy is still waiting on [name] to send something in to be able to dispense

## 2023-04-03 ENCOUNTER — Other Ambulatory Visit: Payer: MEDICAID

## 2023-04-03 ENCOUNTER — Telehealth: Payer: Self-pay | Admitting: Internal Medicine

## 2023-04-03 NOTE — Telephone Encounter (Signed)
 Patient came in office today and picked up sample of Dexcom G7 Receiver.

## 2023-04-03 NOTE — Telephone Encounter (Signed)
 On Yesterday while the patient was here I called her pharmacy and spoke with LeaAnn and I advised to her that the patient came into the office stating she was not able to get her Dexcom, we needed to know why. LeaAnn advised to me that she needed an Authorization. I did tell Thersa Salt that the authorization was approved on March 27, 2023. So she re-ran the claim and then on their end it showed refill too soon, and so she stated to me that she would call the patients insurance to see what was going on.    After the convo with the pharmacy, I went to the lobby and spoke with the patient to let her know what the pharmacy stated. She proceeds to get upset and leave stating that she will not be starting the dexcom and not coming back. Walks out of the office saying "dont worry about it"

## 2023-04-03 NOTE — Telephone Encounter (Signed)
 Patient will stop by and pick up a Dexcom  receiver

## 2023-04-03 NOTE — Telephone Encounter (Signed)
 MEDICATION: Dexcom G7 Receiver  PHARMACY:    [facility] [phone] - [place], [place] - [address] (Ph: [phone])    HAS THE PATIENT CONTACTED THEIR PHARMACY?  Yes  IS THIS A 90 DAY SUPPLY : Yes  IS PATIENT OUT OF MEDICATION: Yes  IF NOT; HOW MUCH IS LEFT:   LAST APPOINTMENT DATE: @3 /18/2025  NEXT APPOINTMENT DATE:@7 /23/2025  DO WE HAVE YOUR PERMISSION TO LEAVE A DETAILED MESSAGE?:Yes  OTHER COMMENTS:  Patient states that she has never had the Dexcom G7 Reiceiver  **Let patient know to contact pharmacy at the end of the day to make sure medication is ready. **  ** Please notify patient to allow 48-72 hours to process**  **Encourage patient to contact the pharmacy for refills or they can request refills through [facility]**

## 2023-04-04 ENCOUNTER — Ambulatory Visit (HOSPITAL_COMMUNITY): Payer: MEDICAID | Admitting: Mental Health

## 2023-04-05 ENCOUNTER — Other Ambulatory Visit: Payer: MEDICAID

## 2023-04-05 DIAGNOSIS — Z006 Encounter for examination for normal comparison and control in clinical research program: Secondary | ICD-10-CM

## 2023-04-11 LAB — HM DIABETES EYE EXAM

## 2023-04-12 ENCOUNTER — Telehealth (HOSPITAL_COMMUNITY): Payer: Self-pay | Admitting: Mental Health

## 2023-04-12 NOTE — Telephone Encounter (Signed)
 PT called today and stated she wanted to cancel her services here. I informed the PT that she had no appointments scheduled - she said that she does not want to speak to anyone here and to cancel all services. I informed the PT she does not have anything scheduled for this office

## 2023-04-17 LAB — GENECONNECT MOLECULAR SCREEN: Genetic Analysis Overall Interpretation: NEGATIVE

## 2023-04-27 ENCOUNTER — Other Ambulatory Visit (INDEPENDENT_AMBULATORY_CARE_PROVIDER_SITE_OTHER): Payer: Self-pay | Admitting: Family Medicine

## 2023-04-27 DIAGNOSIS — E559 Vitamin D deficiency, unspecified: Secondary | ICD-10-CM

## 2023-05-03 ENCOUNTER — Encounter (INDEPENDENT_AMBULATORY_CARE_PROVIDER_SITE_OTHER): Payer: Self-pay | Admitting: Family Medicine

## 2023-05-03 ENCOUNTER — Ambulatory Visit (INDEPENDENT_AMBULATORY_CARE_PROVIDER_SITE_OTHER): Admitting: Family Medicine

## 2023-05-03 VITALS — BP 111/68 | HR 77 | Temp 97.6°F | Ht 66.0 in | Wt 319.0 lb

## 2023-05-03 DIAGNOSIS — F321 Major depressive disorder, single episode, moderate: Secondary | ICD-10-CM

## 2023-05-03 DIAGNOSIS — Z6841 Body Mass Index (BMI) 40.0 and over, adult: Secondary | ICD-10-CM

## 2023-05-03 DIAGNOSIS — Z794 Long term (current) use of insulin: Secondary | ICD-10-CM

## 2023-05-03 DIAGNOSIS — E559 Vitamin D deficiency, unspecified: Secondary | ICD-10-CM | POA: Diagnosis not present

## 2023-05-03 DIAGNOSIS — E1165 Type 2 diabetes mellitus with hyperglycemia: Secondary | ICD-10-CM

## 2023-05-03 MED ORDER — VITAMIN D (ERGOCALCIFEROL) 1.25 MG (50000 UNIT) PO CAPS: 50000.0000 [IU] | ORAL_CAPSULE | ORAL | 0 refills | Status: DC

## 2023-05-03 MED ORDER — TOPIRAMATE 50 MG PO TABS: 50.0000 mg | ORAL_TABLET | Freq: Every day | ORAL | 0 refills | Status: DC
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
## 2023-05-03 NOTE — Progress Notes (Unsigned)
   SUBJECTIVE:  Chief Complaint: Obesity  Interim History: Patient's daughter just had to be rushed to the hospital and her aunt just died.  She is getting thru her days.  Patient has just been on the go most of the last few weeks.  She will occasionally eat a sandwich but otherwise she isn't getting much else in.  For the next few weeks she is hoping to get more consistently on plan.  She is not weighing the meat protein.  Tara Keller is here to discuss her progress with her obesity treatment plan. She is on the following a lower carbohydrate, vegetable and lean protein rich diet plan and states she is following her eating plan approximately 50 % of the time. She states she is not exercising, but is active.   OBJECTIVE: Visit Diagnoses: Problem List Items Addressed This Visit       Other   Episode of moderate major depression (HCC)   Relevant Medications   topiramate  (TOPAMAX ) 50 MG tablet   Vitamin D  deficiency   Relevant Medications   Vitamin D , Ergocalciferol , (DRISDOL ) 1.25 MG (50000 UNIT) CAPS capsule    Vitals Temp: 97.6 F (36.4 C) BP: 111/68 Pulse Rate: 77 SpO2: 97 %   Anthropometric Measurements Height: 5\' 6"  (1.676 m) Weight: (!) 319 lb (144.7 kg) BMI (Calculated): 51.51 Weight at Last Visit: 313 lb Weight Lost Since Last Visit: 0 Weight Gained Since Last Visit: 6 Starting Weight: 311 lb Total Weight Loss (lbs): 0 lb (0 kg)   Body Composition  Body Fat %: 55.7 % Fat Mass (lbs): 177.6 lbs Muscle Mass (lbs): 134.2 lbs Total Body Water  (lbs): 102.2 lbs Visceral Fat Rating : 22   Other Clinical Data Today's Visit #: 7 Starting Date: 11/22/22 Comments: Low carb     ASSESSMENT AND PLAN:  Diet: Tara Keller is currently in the action stage of change. As such, her goal is to continue with weight loss efforts and has agreed to following a lower carbohydrate, vegetable and lean protein rich diet plan.   Exercise:  For substantial health benefits, adults should do  at least 150 minutes (2 hours and 30 minutes) a week of moderate-intensity, or 75 minutes (1 hour and 15 minutes) a week of vigorous-intensity aerobic physical activity, or an equivalent combination of moderate- and vigorous-intensity aerobic activity. Aerobic activity should be performed in episodes of at least 10 minutes, and preferably, it should be spread throughout the week.  Behavior Modification:  We discussed the following Behavioral Modification Strategies today: increasing lean protein intake, decreasing simple carbohydrates, meal planning and cooking strategies, keeping healthy foods in the home, and avoiding temptations. We discussed various medication options to help Tara Keller with her weight loss efforts and we both agreed to ***.  Return in about 3 weeks (around 05/24/2023).Tara Keller She was informed of the importance of frequent follow up visits to maximize her success with intensive lifestyle modifications for her multiple health conditions.  Attestation Statements:   Reviewed by clinician on day of visit: allergies, medications, problem list, medical history, surgical history, family history, social history, and previous encounter notes.   Tara Frizzle, MD

## 2023-05-03 NOTE — Assessment & Plan Note (Signed)
 A1c of 8.6 on labs done between appointments.  She has a Dexcom now.  Occasional lows but mostly elevated blood sugars with fastings around 166.  We discussed importance of increasing protein and limiting simple carbohydrates.

## 2023-05-04 NOTE — Assessment & Plan Note (Signed)
 Patient not feeling any difference on 25mg  of topiramate .  Will increase to 50mg  and new prescription sent in.

## 2023-05-04 NOTE — Assessment & Plan Note (Signed)
 On Vitamin D  prescription with some improvement in energy.  No nausea, vomiting or muscle weakness.  Needs a refill today.

## 2023-05-07 ENCOUNTER — Encounter: Payer: Self-pay | Admitting: Adult Health

## 2023-05-07 ENCOUNTER — Ambulatory Visit: Payer: MEDICAID | Admitting: Adult Health

## 2023-05-07 VITALS — BP 108/62 | HR 78 | Ht 66.0 in | Wt 319.0 lb

## 2023-05-07 DIAGNOSIS — G4733 Obstructive sleep apnea (adult) (pediatric): Secondary | ICD-10-CM | POA: Diagnosis not present

## 2023-05-07 NOTE — Patient Instructions (Signed)
 Your Plan:  Will request change of mask through [employer] [facility] - they will contact you to get this set up  Please follow up with [employer] regarding leaking concerns  Continue nightly use of CPAP with greater than 4 hours per night for optimal benefit and per insurance requirements  Continue to follow with your DME company [employer] [facility] for any needs supplies or CPAP related concerns      Follow-up in 6 months or call earlier if needed      Thank you for coming to see us  at [facility]. I hope we have been able to provide you high quality care today.  You may receive a patient satisfaction survey over the next few weeks. We would appreciate your feedback and comments so that we may continue to improve ourselves and the health of our patients.

## 2023-05-14 ENCOUNTER — Telehealth: Payer: Self-pay | Admitting: Adult Health

## 2023-05-14 DIAGNOSIS — G4733 Obstructive sleep apnea (adult) (pediatric): Secondary | ICD-10-CM

## 2023-05-14 NOTE — Telephone Encounter (Signed)
 Patient called because the pressure is too hard on her machine and she wanted to see if it could be adjusted. She was just here 4/28.

## 2023-05-15 NOTE — Addendum Note (Signed)
 Addended by: Marely Apgar on: 05/15/2023 04:41 PM   Modules accepted: Orders

## 2023-05-15 NOTE — Telephone Encounter (Signed)
 Do you have any other recommendations for this complaint?

## 2023-05-15 NOTE — Telephone Encounter (Addendum)
 Called the patient and advised that based off visit on 05/07/23, it looked like Camilo Cella recommended that the patient would need a mask refit. She states that she dis get the mask Camilo Cella recommended. I looked at her ramp time and its set for 20 min and the cpap is starting at the lowest pressure it can go to. Pt will remain on the current settings and continue to work with the CPAP. Advised if anything if appears that she may actually need to have the maximum setting increased because currently she looks like she is hitting the maximum area and having some residual AHI events. I will inform Camilo Cella NP and see what se recommends. Pt verbalized understanding.

## 2023-05-15 NOTE — Telephone Encounter (Signed)
 We can reduce Max pressure to 10 cm, which may result in slight increase in AHI but if she can tolerate treatment better, it may be ok for now. Remind pt to be consistent with treatment and we can look at a DL in about 1 month.

## 2023-05-16 NOTE — Addendum Note (Signed)
 Addended by: Elton Ham on: 05/16/2023 10:27 AM   Modules accepted: Orders

## 2023-05-16 NOTE — Telephone Encounter (Signed)
 I have made the pressure adjustments and placed order and sent to the company for them to update.

## 2023-06-05 ENCOUNTER — Other Ambulatory Visit: Payer: Self-pay

## 2023-06-05 ENCOUNTER — Ambulatory Visit (HOSPITAL_BASED_OUTPATIENT_CLINIC_OR_DEPARTMENT_OTHER)
Admission: RE | Admit: 2023-06-05 | Discharge: 2023-06-05 | Disposition: A | Discharge status: Home / Self Care | Payer: MEDICAID | Source: Ambulatory Visit | Attending: Physician Assistant | Admitting: Physician Assistant

## 2023-06-05 ENCOUNTER — Ambulatory Visit: Payer: Self-pay | Admitting: Physician Assistant

## 2023-06-05 ENCOUNTER — Ambulatory Visit
Admission: RE | Admit: 2023-06-05 | Discharge: 2023-06-05 | Discharge status: Home / Self Care | Payer: MEDICAID | Source: Ambulatory Visit

## 2023-06-05 VITALS — BP 115/77 | HR 78 | Temp 97.7°F | Resp 17 | Ht 66.0 in | Wt 313.0 lb

## 2023-06-05 DIAGNOSIS — R8290 Unspecified abnormal findings in urine: Secondary | ICD-10-CM | POA: Insufficient documentation

## 2023-06-05 DIAGNOSIS — M5442 Lumbago with sciatica, left side: Secondary | ICD-10-CM | POA: Insufficient documentation

## 2023-06-05 DIAGNOSIS — M5441 Lumbago with sciatica, right side: Secondary | ICD-10-CM | POA: Insufficient documentation

## 2023-06-05 DIAGNOSIS — E1169 Type 2 diabetes mellitus with other specified complication: Secondary | ICD-10-CM

## 2023-06-05 DIAGNOSIS — F439 Reaction to severe stress, unspecified: Secondary | ICD-10-CM | POA: Insufficient documentation

## 2023-06-05 LAB — POCT URINALYSIS DIP (MANUAL ENTRY)
Bilirubin, UA: NEGATIVE
Glucose, UA: 500 mg/dL — AB
Ketones, POC UA: NEGATIVE mg/dL
Leukocytes, UA: NEGATIVE
Nitrite, UA: NEGATIVE
Protein Ur, POC: NEGATIVE mg/dL
Spec Grav, UA: 1.015 (ref 1.010–1.025)
Urobilinogen, UA: 1 U/dL
pH, UA: 7 (ref 5.0–8.0)

## 2023-06-05 MED ORDER — BACLOFEN 10 MG PO TABS: 10.0000 mg | ORAL_TABLET | Freq: Two times a day (BID) | ORAL | 0 refills | Status: AC

## 2023-06-05 MED ORDER — MELOXICAM 15 MG PO TABS: 15.0000 mg | ORAL_TABLET | Freq: Every day | ORAL | 0 refills | Status: AC

## 2023-06-05 MED ORDER — ACCU-CHEK FASTCLIX LANCETS MISC: 3 refills | Status: DC

## 2023-06-05 MED ORDER — LIDOCAINE 5 % EX PTCH
1.0000 | MEDICATED_PATCH | CUTANEOUS | 0 refills | Status: DC
Start: 2023-06-05 — End: 2023-09-06

## 2023-06-05 NOTE — ED Triage Notes (Signed)
 Pt states that she has some lower back and lower abdominal pain. X1 week
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
## 2023-06-05 NOTE — Discharge Instructions (Signed)
 Your urine was not convincing for infection.  I am going to send for culture and we will contact you if we need to start antibiotics.  Take Mobic  daily to help with pain.  Do not take additional NSAIDs with this medication including aspirin , ibuprofen /Advil , naproxen /Aleve .  Take baclofen up to twice a day.  This will make you sleepy so do not drive or drink alcohol with taking it.  Apply lidocaine  patch during the day and then remove this at night.  I would like you to follow-up with sports medicine; call to schedule an appointment.  Go get the x-ray soon as you leave I will contact you if this is abnormal.  If anything changes and you have increasing pain, fever, going to the bathroom on yourself without noticing it, weakness in your legs you need to go to the ER.  I am very sorry for all the things that you are going through.  I do think seeing a therapist might be helpful and I recommend following up with behavioral health urgent care as they may be able to help with connecting to resources.

## 2023-06-06 LAB — URINE CULTURE: Culture: <10000 — AB

## 2023-06-07 ENCOUNTER — Encounter (INDEPENDENT_AMBULATORY_CARE_PROVIDER_SITE_OTHER): Payer: Self-pay | Admitting: Adult Health

## 2023-06-07 ENCOUNTER — Ambulatory Visit (INDEPENDENT_AMBULATORY_CARE_PROVIDER_SITE_OTHER): Admitting: Adult Health

## 2023-06-07 VITALS — BP 137/80 | HR 88 | Temp 98.1°F | Ht 66.0 in | Wt 310.0 lb

## 2023-06-07 DIAGNOSIS — E669 Obesity, unspecified: Secondary | ICD-10-CM | POA: Diagnosis not present

## 2023-06-07 DIAGNOSIS — F321 Major depressive disorder, single episode, moderate: Secondary | ICD-10-CM

## 2023-06-07 DIAGNOSIS — E559 Vitamin D deficiency, unspecified: Secondary | ICD-10-CM | POA: Diagnosis not present

## 2023-06-07 DIAGNOSIS — Z6841 Body Mass Index (BMI) 40.0 and over, adult: Secondary | ICD-10-CM

## 2023-06-07 DIAGNOSIS — Z7984 Long term (current) use of oral hypoglycemic drugs: Secondary | ICD-10-CM

## 2023-06-07 DIAGNOSIS — E1165 Type 2 diabetes mellitus with hyperglycemia: Secondary | ICD-10-CM

## 2023-06-07 DIAGNOSIS — Z794 Long term (current) use of insulin: Secondary | ICD-10-CM

## 2023-06-07 MED ORDER — VITAMIN D (ERGOCALCIFEROL) 1.25 MG (50000 UNIT) PO CAPS
50000.0000 [IU] | ORAL_CAPSULE | ORAL | 0 refills | Status: DC
Start: 2023-06-07 — End: 2023-07-19

## 2023-06-07 MED ORDER — TOPIRAMATE 50 MG PO TABS: 50.0000 mg | ORAL_TABLET | Freq: Every day | ORAL | 0 refills | Status: DC

## 2023-06-07 NOTE — Progress Notes (Signed)
 WEIGHT SUMMARY AND BIOMETRICS  Vitals Temp: 98.1 F (36.7 C) BP: 137/80 Pulse Rate: 88 SpO2: 96 %   Anthropometric Measurements Height: 5\' 6"  (1.676 m) Weight: (!) 310 lb (140.6 kg) BMI (Calculated): 50.06 Weight at Last Visit: 319 lb Weight Lost Since Last Visit: 9 lb Weight Gained Since Last Visit: 0 Starting Weight: 311 b Total Weight Loss (lbs): 9 lb (4.082 kg)   Body Composition  Body Fat %: 55.6 % Fat Mass (lbs): 172.4 lbs Muscle Mass (lbs): 130.8 lbs Total Body Water  (lbs): 99.6 lbs Visceral Fat Rating : 21   Other Clinical Data Fasting: no Labs: no Today's Visit #: 8 Starting Date: 11/22/22 Comments: lower carbs, veg. ,lean protein rich diet    Chief Complaint:   OBESITY Tara Keller is here to discuss her progress with her obesity treatment plan.  She is on the following a lower carbohydrate, vegetable and lean protein rich diet plan and states she is following her eating plan approximately 50 % of the time.  She states she is exercising Walking 10 minutes 3 times per week.  Interim History:  Tara Keller lives with 60 year old fiancee They have been a couple > 13 years He has Stage IV Lymphoma - oral chemotherapy causes pain and N/V He self medicates with marijuana  Exercise-Walking with her 43 year old grandson- 3  Hydration-she states "I drink two jugs of water " a day.  She estimates a "jug" is the size of a milk "jug"  Of Note- Hx of pancreatitis : GLP-1 and GLP-1/GIP therapy contraindicated.  Subjective:   1. Vitamin D  deficiency  Latest Reference Range & Units 11/22/22 09:37  Vitamin D , 25-Hydroxy 30.0 - 100.0 ng/mL 33.7   She is on weekly Ergocalciferol - denies N/V/Muscle Weakness  2. Type 2 diabetes mellitus with hyperglycemia, with long-term current use of insulin  (HCC) Lab Results  Component Value Date   HGBA1C 8.6 (A) 03/27/2023   HGBA1C 9.8 (A) 12/26/2022   HGBA1C 10.5 (H) 11/22/2022   T2D managed by Endo/Dr. Rosalea Collin Hx of  pancreatitis : GLP-1 and GLP-1/GIP therapy contraindicated. She is currently on metFORMIN  (GLUCOPHAGE -XR) 750 MG 24 hr tablet BID  insulin  glargine (LANTUS  SOLOSTAR) 100 UNIT/ML Solostar Pen 84U  insulin  aspart (NOVOLOG  FLEXPEN) 100 UNIT/ML FlexPen 80U  dapagliflozin  propanediol (FARXIGA ) 10 MG TABS tablet DAILY   3. Episode of moderate major depression (HCC) 4/24/224 Topamax  increased from 25mg  to 50mg  She reports sig decrease of late evening polyphagia   Assessment/Plan:   1. Vitamin D  deficiency Refill  Vitamin D , Ergocalciferol , (DRISDOL ) 1.25 MG (50000 UNIT) CAPS capsule Take 1 capsule (50,000 Units total) by mouth every 7 (seven) days. Dispense: 4 capsule, Refills: 0 ordered   2. Type 2 diabetes mellitus with hyperglycemia, with long-term current use of insulin  (HCC) Limit sugar/simple CHO  3. Episode of moderate major depression (HCC) Refill  topiramate  (TOPAMAX ) 50 MG tablet Take 1 tablet (50 mg total) by mouth daily. Dispense: 30 tablet, Refills: 0 ordered   4. BMI 50.0-59.9, adult (HCC), CURRENT BMI 50.1  Tara Keller is currently in the action stage of change. As such, her goal is to continue with weight loss efforts. She has agreed to practicing portion control and making smarter food choices, such as increasing vegetables and decreasing simple carbohydrates.   Exercise goals: All adults should avoid inactivity. Some physical activity is better than none, and adults who participate in any amount of physical activity gain some health benefits. Adults should also include muscle-strengthening activities  that involve all major muscle groups on 2 or more days a week. Increase Walking  Behavioral modification strategies: increasing lean protein intake, decreasing simple carbohydrates, increasing vegetables, increasing water  intake, no skipping meals, meal planning and cooking strategies, keeping healthy foods in the home, ways to avoid boredom eating, and planning for success.  Tara Keller  has agreed to follow-up with our clinic in 4 weeks. She was informed of the importance of frequent follow-up visits to maximize her success with intensive lifestyle modifications for her multiple health conditions.   Check Fasting Labs at next OV  Objective:   Blood pressure 137/80, pulse 88, temperature 98.1 F (36.7 C), height 5\' 6"  (1.676 m), weight (!) 310 lb (140.6 kg), last menstrual period 01/23/2014, SpO2 96%. Body mass index is 50.04 kg/m.  General: Cooperative, alert, well developed, in no acute distress. HEENT: Conjunctivae and lids unremarkable. Cardiovascular: Regular rhythm.  Lungs: Normal work of breathing. Neurologic: No focal deficits.   Lab Results  Component Value Date   CREATININE 0.78 01/22/2023   BUN 11 01/22/2023   NA 135 01/22/2023   K 4.1 01/22/2023   CL 101 01/22/2023   CO2 29 01/22/2023   Lab Results  Component Value Date   ALT 42 11/05/2022   AST 23 11/05/2022   GGT 248 (H) 04/06/2020   ALKPHOS 180 (H) 11/05/2022   BILITOT 0.3 11/05/2022   Lab Results  Component Value Date   HGBA1C 8.6 (A) 03/27/2023   HGBA1C 9.8 (A) 12/26/2022   HGBA1C 10.5 (H) 11/22/2022   HGBA1C 9.2 (A) 09/15/2022   HGBA1C 9.0 (A) 06/28/2022   No results found for: "INSULIN " Lab Results  Component Value Date   TSH 1.700 11/22/2022   Lab Results  Component Value Date   CHOL 109 11/22/2022   HDL 45 11/22/2022   LDLCALC 46 11/22/2022   TRIG 95 11/22/2022   CHOLHDL 4 11/07/2021   Lab Results  Component Value Date   VD25OH 33.7 11/22/2022   VD25OH 41.2 04/06/2020   VD25OH 23.8 (L) 06/19/2017   Lab Results  Component Value Date   WBC 7.2 11/05/2022   HGB 12.2 11/05/2022   HCT 38.7 11/05/2022   MCV 86.0 11/05/2022   PLT 247 11/05/2022   Lab Results  Component Value Date   IRON 56 07/27/2020   TIBC 269 07/27/2020   FERRITIN 164 (H) 07/27/2020   Attestation Statements:   Reviewed by clinician on day of visit: allergies, medications, problem list,  medical history, surgical history, family history, social history, and previous encounter notes.  I have reviewed the above documentation for accuracy and completeness, and I agree with the above. -  Tara Keller d. Tara Vanburen, NP-C

## 2023-06-21 ENCOUNTER — Ambulatory Visit (INDEPENDENT_AMBULATORY_CARE_PROVIDER_SITE_OTHER): Payer: MEDICAID | Admitting: Family Medicine

## 2023-06-21 ENCOUNTER — Encounter: Payer: Self-pay | Admitting: Family Medicine

## 2023-06-21 VITALS — BP 114/78 | HR 66 | Wt 316.0 lb

## 2023-06-21 DIAGNOSIS — R1010 Upper abdominal pain, unspecified: Secondary | ICD-10-CM

## 2023-06-21 DIAGNOSIS — I10 Essential (primary) hypertension: Secondary | ICD-10-CM | POA: Diagnosis not present

## 2023-06-21 DIAGNOSIS — Z794 Long term (current) use of insulin: Secondary | ICD-10-CM | POA: Diagnosis not present

## 2023-06-21 DIAGNOSIS — F329 Major depressive disorder, single episode, unspecified: Secondary | ICD-10-CM

## 2023-06-21 DIAGNOSIS — N950 Postmenopausal bleeding: Secondary | ICD-10-CM

## 2023-06-21 DIAGNOSIS — Z6841 Body Mass Index (BMI) 40.0 and over, adult: Secondary | ICD-10-CM

## 2023-06-21 DIAGNOSIS — E1165 Type 2 diabetes mellitus with hyperglycemia: Secondary | ICD-10-CM

## 2023-06-21 DIAGNOSIS — Z87891 Personal history of nicotine dependence: Secondary | ICD-10-CM
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
## 2023-06-22 ENCOUNTER — Encounter: Payer: Self-pay | Admitting: Family Medicine

## 2023-06-22 LAB — CBC WITH DIFFERENTIAL/PLATELET
Basophils Absolute: 0 10*3/uL (ref 0.0–0.2)
Basos: 1 %
EOS (ABSOLUTE): 0.2 10*3/uL (ref 0.0–0.4)
Eos: 2 %
Hematocrit: 40.7 % (ref 34.0–46.6)
Hemoglobin: 12.6 g/dL (ref 11.1–15.9)
Immature Grans (Abs): 0 10*3/uL (ref 0.0–0.1)
Immature Granulocytes: 0 %
Lymphocytes Absolute: 3.3 10*3/uL — ABNORMAL HIGH (ref 0.7–3.1)
Lymphs: 43 %
MCH: 27.2 pg (ref 26.6–33.0)
MCHC: 31 g/dL — ABNORMAL LOW (ref 31.5–35.7)
MCV: 88 fL (ref 79–97)
Monocytes Absolute: 0.7 10*3/uL (ref 0.1–0.9)
Monocytes: 9 %
Neutrophils Absolute: 3.4 10*3/uL (ref 1.4–7.0)
Neutrophils: 45 %
Platelets: 267 10*3/uL (ref 150–450)
RBC: 4.64 x10E6/uL (ref 3.77–5.28)
RDW: 13.5 % (ref 11.7–15.4)
WBC: 7.6 10*3/uL (ref 3.4–10.8)

## 2023-06-22 LAB — COMPREHENSIVE METABOLIC PANEL WITH GFR
ALT: 34 IU/L — ABNORMAL HIGH (ref 0–32)
AST: 23 IU/L (ref 0–40)
Albumin: 3.9 g/dL (ref 3.8–4.9)
Alkaline Phosphatase: 266 IU/L — ABNORMAL HIGH (ref 44–121)
BUN/Creatinine Ratio: 14 (ref 9–23)
BUN: 11 mg/dL (ref 6–24)
Bilirubin Total: 0.2 mg/dL (ref 0.0–1.2)
CO2: 24 mmol/L (ref 20–29)
Calcium: 9.3 mg/dL (ref 8.7–10.2)
Chloride: 99 mmol/L (ref 96–106)
Creatinine, Ser: 0.81 mg/dL (ref 0.57–1.00)
Globulin, Total: 3.2 g/dL (ref 1.5–4.5)
Glucose: 208 mg/dL — ABNORMAL HIGH (ref 70–99)
Potassium: 4.2 mmol/L (ref 3.5–5.2)
Sodium: 137 mmol/L (ref 134–144)
Total Protein: 7.1 g/dL (ref 6.0–8.5)
eGFR: 84 mL/min/{1.73_m2} (ref >59)

## 2023-06-22 LAB — AMYLASE: Amylase: 43 U/L (ref 31–110)

## 2023-06-22 LAB — LIPASE: Lipase: 33 U/L (ref 14–72)

## 2023-06-25 ENCOUNTER — Telehealth: Payer: Self-pay

## 2023-06-25 ENCOUNTER — Ambulatory Visit: Payer: Self-pay | Admitting: Family Medicine

## 2023-06-25 NOTE — Telephone Encounter (Signed)
 Copied from CRM [id number]. Topic: Clinical - Request for Lab/Test Order >> Jun 25, 2023  9:38 AM Rennis Case wrote: Reason for CRM: Pt inquiring about test results, pt saw they were available on mychart. Pt requesting call back w/ results, [phone].

## 2023-06-28 ENCOUNTER — Ambulatory Visit: Payer: Self-pay

## 2023-06-28 NOTE — Telephone Encounter (Signed)
  FYI Only or Action Required?: Action required by provider: lab or test result follow-up needed.  Patient was last seen in primary care on 06/21/2023 by Tara Abo, Tara Keller. Called Nurse Triage reporting lab results. Symptoms began n/a. Interventions attempted: Nothing. Symptoms are: unchanged.  Triage Disposition: Call PCP When Office is Open  Patient/caregiver understands and will follow disposition?: Yes Copied from CRM [id number]. Topic: Clinical - Lab/Test Results >> Jun 28, 2023 10:31 AM Tara Keller wrote: Reason for CRM: needs lab clarity Reason for Disposition  Caller requesting routine or non-urgent lab result  Answer Assessment - Initial Assessment Questions 1. REASON FOR CALL or QUESTION: What is your reason for calling today? or How can I best help you? or What question do you have that I can help answer?     Patient has questions about the meaning of lab results, Alt and Alk phos.  She is very nervous about liver damage/function 2. CALLER: Document the source of call. (e.Keller., laboratory, patient).     patient  Protocols used: PCP Call - No Triage-A-AH

## 2023-06-29 NOTE — Telephone Encounter (Signed)
 Please advise patient is wanting further explanation on lab work

## 2023-06-29 NOTE — Telephone Encounter (Signed)
 Called patient and went over providers note and scheduled her appointment for labs

## 2023-07-04 ENCOUNTER — Telehealth: Payer: Self-pay | Admitting: Family Medicine

## 2023-07-04 NOTE — Telephone Encounter (Signed)
 Patient was identified as falling into the [place] Measure - Diabetes.   Patient was: Appointment scheduled for lab or office visit for A1c.

## 2023-07-16 ENCOUNTER — Other Ambulatory Visit (INDEPENDENT_AMBULATORY_CARE_PROVIDER_SITE_OTHER): Payer: MEDICAID

## 2023-07-16 DIAGNOSIS — R748 Abnormal levels of other serum enzymes: Secondary | ICD-10-CM

## 2023-07-16 NOTE — Progress Notes (Signed)
Patient came in for labs Patient tolerated well

## 2023-07-17 LAB — COMPREHENSIVE METABOLIC PANEL WITH GFR
ALT: 26 IU/L (ref 0–32)
AST: 16 IU/L (ref 0–40)
Albumin: 3.8 g/dL (ref 3.8–4.9)
Alkaline Phosphatase: 206 IU/L — ABNORMAL HIGH (ref 44–121)
BUN/Creatinine Ratio: 16 (ref 9–23)
BUN: 13 mg/dL (ref 6–24)
Bilirubin Total: 0.2 mg/dL (ref 0.0–1.2)
CO2: 21 mmol/L (ref 20–29)
Calcium: 9.1 mg/dL (ref 8.7–10.2)
Chloride: 103 mmol/L (ref 96–106)
Creatinine, Ser: 0.83 mg/dL (ref 0.57–1.00)
Globulin, Total: 2.8 g/dL (ref 1.5–4.5)
Glucose: 210 mg/dL — ABNORMAL HIGH (ref 70–99)
Potassium: 4.1 mmol/L (ref 3.5–5.2)
Sodium: 139 mmol/L (ref 134–144)
Total Protein: 6.6 g/dL (ref 6.0–8.5)
eGFR: 81 mL/min/1.73 (ref >59)

## 2023-07-19 ENCOUNTER — Encounter (INDEPENDENT_AMBULATORY_CARE_PROVIDER_SITE_OTHER): Payer: Self-pay | Admitting: Family Medicine

## 2023-07-19 ENCOUNTER — Ambulatory Visit (INDEPENDENT_AMBULATORY_CARE_PROVIDER_SITE_OTHER): Payer: MEDICAID | Admitting: Family Medicine

## 2023-07-19 VITALS — BP 110/68 | HR 82 | Temp 97.9°F | Ht 66.0 in | Wt 308.0 lb

## 2023-07-19 DIAGNOSIS — Z7984 Long term (current) use of oral hypoglycemic drugs: Secondary | ICD-10-CM

## 2023-07-19 DIAGNOSIS — Z6841 Body Mass Index (BMI) 40.0 and over, adult: Secondary | ICD-10-CM

## 2023-07-19 DIAGNOSIS — E785 Hyperlipidemia, unspecified: Secondary | ICD-10-CM

## 2023-07-19 DIAGNOSIS — E1165 Type 2 diabetes mellitus with hyperglycemia: Secondary | ICD-10-CM

## 2023-07-19 DIAGNOSIS — E1169 Type 2 diabetes mellitus with other specified complication: Secondary | ICD-10-CM

## 2023-07-19 DIAGNOSIS — Z794 Long term (current) use of insulin: Secondary | ICD-10-CM | POA: Diagnosis not present

## 2023-07-19 DIAGNOSIS — F321 Major depressive disorder, single episode, moderate: Secondary | ICD-10-CM

## 2023-07-19 DIAGNOSIS — E559 Vitamin D deficiency, unspecified: Secondary | ICD-10-CM | POA: Diagnosis not present

## 2023-07-19 DIAGNOSIS — E669 Obesity, unspecified: Secondary | ICD-10-CM

## 2023-07-19 MED ORDER — VITAMIN D (ERGOCALCIFEROL) 1.25 MG (50000 UNIT) PO CAPS: 50000.0000 [IU] | ORAL_CAPSULE | ORAL | 0 refills | Status: DC

## 2023-07-19 MED ORDER — TOPIRAMATE 50 MG PO TABS: 50.0000 mg | ORAL_TABLET | Freq: Every day | ORAL | 0 refills | Status: DC

## 2023-07-19 NOTE — Progress Notes (Signed)
 SUBJECTIVE:  Chief Complaint: Obesity  Interim History: Patient voices that she has been taking only 1 shot of insulin  daily- doing lantus  at night.  She is feeling very fatigued. She voices concern about her liver and kidney levels. Since her last appointment she has been trying to be mindful of food choices to get more protein in. Her birthday is coming up and she doesn't think she will be doing much.    Tara Keller is here to discuss her progress with her obesity treatment plan. She is on the following a lower carbohydrate, vegetable and lean protein rich diet plan and states she is following her eating plan approximately 80 % of the time. She states she is more activity and stretching.  OBJECTIVE: Visit Diagnoses: Problem List Items Addressed This Visit       Endocrine   Type 2 diabetes mellitus with hyperglycemia, with long-term current use of insulin  (HCC) - Primary   Patient mentions her blood sugars have been very high. She has her glucometer today and she has had a blood sugar average of 230-250 over the last 7-30 days.  She agrees she likely isn't getting in enough protein.      Relevant Orders   Comprehensive metabolic panel with GFR (Completed)   Hemoglobin A1c (Completed)   Insulin , random (Completed)   Vitamin B12 (Completed)   Hyperlipidemia associated with type 2 diabetes mellitus (HCC)   Relevant Orders   Lipid Panel With LDL/HDL Ratio (Completed)     Other   Morbid obesity (HCC)   Episode of moderate major depression (HCC)   Relevant Medications   topiramate  (TOPAMAX ) 50 MG tablet   Vitamin D  deficiency   Relevant Medications   Vitamin D , Ergocalciferol , (DRISDOL ) 1.25 MG (50000 UNIT) CAPS capsule   Other Relevant Orders   VITAMIN D  25 Hydroxy (Vit-D Deficiency, Fractures) (Completed)   Other Visit Diagnoses       BMI 45.0-49.9, adult (HCC)           No data recorded      07/23/2023    1:38 PM 07/19/2023   10:00 AM 06/21/2023    3:14 PM  Vitals with  BMI  Height  5' 6   Weight 313 lbs 3 oz 308 lbs 316 lbs  BMI 50.58 49.74 51.03  Systolic 131 110 885  Diastolic 61 68 78  Pulse 80 82 66       ASSESSMENT AND PLAN: Assessment & Plan Type 2 diabetes mellitus with hyperglycemia, with long-term current use of insulin  (HCC) Patient mentions her blood sugars have been very high. She has her glucometer today and she has had a blood sugar average of 230-250 over the last 7-30 days.  She agrees she likely isn't getting in enough protein. Obesity with starting BMI of 50.3 Anthropometric Measurements Height: 5' 6 (1.676 m) Weight: (!) 308 lb (139.7 kg) BMI (Calculated): 49.74 Weight at Last Visit: 310 lb Weight Lost Since Last Visit: 2 Weight Gained Since Last Visit: 0 Starting Weight: 311 lb Total Weight Loss (lbs): 3 lb (1.361 kg) Body Composition  Body Fat %: 54.2 % Fat Mass (lbs): 167.4 lbs Muscle Mass (lbs): 134.2 lbs Total Body Water  (lbs): 97.4 lbs Visceral Fat Rating : 21 Other Clinical Data Fasting: yes Labs: yes Today's Visit #: 9 Starting Date: 11/22/22 Comments: Low Carb  BMI 45.0-49.9, adult (HCC)  Vitamin D  deficiency On prescription strength Vitamin D  previously and needs a refill of her Vitamin D  today as well as repeat vitamin  d level.  Will plan to discuss treatment based on lab at next appointment. Episode of moderate major depression (HCC) Patient is feeling some improvement on topiramate  and needs refill today.  She denies any side effects of that medication. Hyperlipidemia associated with type 2 diabetes mellitus (HCC) Elevated triglycerides and low HDL previously.  She is working on food intake and being more in control of saturated fat intake.  Needs repeat FLP today.  Discuss lab at next appointment.   Diet: Tara Keller is currently in the action stage of change. As such, her goal is to continue with weight loss efforts and has agreed to following a lower carbohydrate, vegetable and lean protein rich diet  plan.   Exercise:  For substantial health benefits, adults should do at least 150 minutes (2 hours and 30 minutes) a week of moderate-intensity, or 75 minutes (1 hour and 15 minutes) a week of vigorous-intensity aerobic physical activity, or an equivalent combination of moderate- and vigorous-intensity aerobic activity. Aerobic activity should be performed in episodes of at least 10 minutes, and preferably, it should be spread throughout the week.  Behavior Modification:  We discussed the following Behavioral Modification Strategies today: increasing lean protein intake, decreasing simple carbohydrates, increasing vegetables, no skipping meals, and avoiding temptations.   Return in about 4 weeks (around 08/16/2023).   She was informed of the importance of frequent follow up visits to maximize her success with intensive lifestyle modifications for her multiple health conditions.  Attestation Statements:   Reviewed by clinician on day of visit: allergies, medications, problem list, medical history, surgical history, family history, social history, and previous encounter notes.     Adelita Cho, MD

## 2023-07-19 NOTE — Assessment & Plan Note (Addendum)
 Patient mentions her blood sugars have been very high. She has her glucometer today and she has had a blood sugar average of 230-250 over the last 7-30 days.  She agrees she likely isn't getting in enough protein.

## 2023-07-20 LAB — COMPREHENSIVE METABOLIC PANEL WITH GFR
ALT: 26 IU/L (ref 0–32)
AST: 18 IU/L (ref 0–40)
Albumin: 3.9 g/dL (ref 3.8–4.9)
Alkaline Phosphatase: 221 IU/L — ABNORMAL HIGH (ref 44–121)
BUN/Creatinine Ratio: 16 (ref 9–23)
BUN: 13 mg/dL (ref 6–24)
Bilirubin Total: <0.2 mg/dL (ref 0.0–1.2)
CO2: 21 mmol/L (ref 20–29)
Calcium: 9.2 mg/dL (ref 8.7–10.2)
Chloride: 101 mmol/L (ref 96–106)
Creatinine, Ser: 0.8 mg/dL (ref 0.57–1.00)
Globulin, Total: 3.1 g/dL (ref 1.5–4.5)
Glucose: 259 mg/dL — ABNORMAL HIGH (ref 70–99)
Potassium: 4.2 mmol/L (ref 3.5–5.2)
Sodium: 136 mmol/L (ref 134–144)
Total Protein: 7 g/dL (ref 6.0–8.5)
eGFR: 85 mL/min/1.73 (ref >59)

## 2023-07-20 LAB — INSULIN, RANDOM: INSULIN: 27.2 u[IU]/mL — ABNORMAL HIGH (ref 2.6–24.9)

## 2023-07-20 LAB — HEMOGLOBIN A1C
Est. average glucose Bld gHb Est-mCnc: 235 mg/dL
Hgb A1c MFr Bld: 9.8 % — ABNORMAL HIGH (ref 4.8–5.6)

## 2023-07-20 LAB — VITAMIN D 25 HYDROXY (VIT D DEFICIENCY, FRACTURES): Vit D, 25-Hydroxy: 39.2 ng/mL (ref 30.0–100.0)

## 2023-07-20 LAB — LIPID PANEL WITH LDL/HDL RATIO
Cholesterol, Total: 107 mg/dL (ref 100–199)
HDL: 39 mg/dL — ABNORMAL LOW (ref >39)
LDL Chol Calc (NIH): 49 mg/dL (ref 0–99)
LDL/HDL Ratio: 1.3 ratio (ref 0.0–3.2)
Triglycerides: 97 mg/dL (ref 0–149)
VLDL Cholesterol Cal: 19 mg/dL (ref 5–40)

## 2023-07-20 LAB — VITAMIN B12: Vitamin B-12: 766 pg/mL (ref 232–1245)

## 2023-07-23 ENCOUNTER — Encounter: Payer: Self-pay | Admitting: Psychiatry

## 2023-07-23 ENCOUNTER — Inpatient Hospital Stay: Payer: MEDICAID | Attending: Psychiatry | Admitting: Psychiatry

## 2023-07-23 VITALS — BP 131/61 | HR 80 | Temp 98.1°F | Resp 20 | Wt 313.2 lb

## 2023-07-23 DIAGNOSIS — C541 Malignant neoplasm of endometrium: Secondary | ICD-10-CM

## 2023-07-23 DIAGNOSIS — Z8542 Personal history of malignant neoplasm of other parts of uterus: Secondary | ICD-10-CM | POA: Diagnosis present

## 2023-07-23 NOTE — Patient Instructions (Addendum)
 It was a pleasure to see you in clinic today. - normal exam today - Return visit planned for 6 months  Dr. Mcarthur, GYN office number [phone]  Thank you very much for allowing me to provide care for you today.  I appreciate your confidence in choosing our Gynecologic Oncology team at [facility].  If you have any questions about your visit today please call our office or send us  a MyChart message and we will get back to you as soon as possible.
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
## 2023-07-25 NOTE — Assessment & Plan Note (Signed)
 Anthropometric Measurements Height: 5' 6 (1.676 m) Weight: (!) 308 lb (139.7 kg) BMI (Calculated): 49.74 Weight at Last Visit: 310 lb Weight Lost Since Last Visit: 2 Weight Gained Since Last Visit: 0 Starting Weight: 311 lb Total Weight Loss (lbs): 3 lb (1.361 kg) Body Composition  Body Fat %: 54.2 % Fat Mass (lbs): 167.4 lbs Muscle Mass (lbs): 134.2 lbs Total Body Water  (lbs): 97.4 lbs Visceral Fat Rating : 21 Other Clinical Data Fasting: yes Labs: yes Today's Visit #: 9 Starting Date: 11/22/22 Comments: Low Carb

## 2023-07-25 NOTE — Assessment & Plan Note (Signed)
 Elevated triglycerides and low HDL previously.  She is working on food intake and being more in control of saturated fat intake.  Needs repeat FLP today.  Discuss lab at next appointment.

## 2023-07-25 NOTE — Assessment & Plan Note (Signed)
 Patient is feeling some improvement on topiramate  and needs refill today.  She denies any side effects of that medication.

## 2023-07-25 NOTE — Assessment & Plan Note (Signed)
 On prescription strength Vitamin D  previously and needs a refill of her Vitamin D  today as well as repeat vitamin d  level.  Will plan to discuss treatment based on lab at next appointment.

## 2023-08-01 ENCOUNTER — Encounter: Payer: Self-pay | Admitting: Internal Medicine

## 2023-08-01 ENCOUNTER — Ambulatory Visit: Payer: MEDICAID | Admitting: Internal Medicine

## 2023-08-01 VITALS — BP 124/70 | HR 72 | Ht 66.0 in | Wt 315.0 lb

## 2023-08-01 DIAGNOSIS — Z794 Long term (current) use of insulin: Secondary | ICD-10-CM | POA: Diagnosis not present

## 2023-08-01 DIAGNOSIS — E1165 Type 2 diabetes mellitus with hyperglycemia: Secondary | ICD-10-CM | POA: Diagnosis not present

## 2023-08-01 DIAGNOSIS — E785 Hyperlipidemia, unspecified: Secondary | ICD-10-CM

## 2023-08-01 DIAGNOSIS — E1169 Type 2 diabetes mellitus with other specified complication: Secondary | ICD-10-CM | POA: Diagnosis not present

## 2023-08-01 DIAGNOSIS — E1142 Type 2 diabetes mellitus with diabetic polyneuropathy: Secondary | ICD-10-CM | POA: Diagnosis not present

## 2023-08-01 MED ORDER — LANTUS SOLOSTAR 100 UNIT/ML ~~LOC~~ SOPN: 84.0000 [IU] | PEN_INJECTOR | Freq: Every day | SUBCUTANEOUS | 5 refills | Status: DC

## 2023-08-01 MED ORDER — DAPAGLIFLOZIN PROPANEDIOL 10 MG PO TABS: 10.0000 mg | ORAL_TABLET | Freq: Every day | ORAL | 3 refills | Status: DC

## 2023-08-01 MED ORDER — INSULIN PEN NEEDLE 31G X 5 MM MISC: 1.0000 | Freq: Four times a day (QID) | 3 refills | Status: DC

## 2023-08-01 MED ORDER — METFORMIN HCL ER 750 MG PO TB24: 750.0000 mg | ORAL_TABLET | Freq: Two times a day (BID) | ORAL | 3 refills | Status: AC

## 2023-08-01 MED ORDER — NOVOLOG FLEXPEN 100 UNIT/ML ~~LOC~~ SOPN: 18.0000 [IU] | PEN_INJECTOR | Freq: Three times a day (TID) | SUBCUTANEOUS | 3 refills | Status: AC
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
## 2023-08-01 NOTE — Patient Instructions (Addendum)
-   Continue Metformin  750 mg to 1 tablet TWICE a day  - Continue Farxiga  10 mg daily  - Increase  Lantus   84 units ONCE a day  - Take  Novolog  18 units with each meal   - Novolog  correctional insulin : ADD extra units on insulin  to your meal-time Novolog  dose if your blood sugars are higher than 155. Use the scale below to help guide you:   Blood sugar before meal Number of units to inject  Less than 155 0 unit  156 - 180 1 units  181 - 205 2 units  206 - 230 3 units  231 - 255 4 units  256 - 280 5 units  281 - 305 6 units  306 - 330 7 units  331- 355 8 units      HOW TO TREAT LOW BLOOD SUGARS (Blood sugar LESS THAN 70 MG/DL) Please follow the RULE OF 15 for the treatment of hypoglycemia treatment (when your (blood sugars are less than 70 mg/dL)   STEP 1: Take 15 grams of carbohydrates when your blood sugar is low, which includes:  3-4 GLUCOSE TABS  OR 3-4 OZ OF JUICE OR REGULAR SODA OR ONE TUBE OF GLUCOSE GEL    STEP 2: RECHECK blood sugar in 15 MINUTES STEP 3: If your blood sugar is still low at the 15 minute recheck --> then, go back to STEP 1 and treat AGAIN with another 15 grams of carbohydrates.

## 2023-08-02 ENCOUNTER — Encounter: Payer: Self-pay | Admitting: Internal Medicine

## 2023-08-15 ENCOUNTER — Ambulatory Visit (INDEPENDENT_AMBULATORY_CARE_PROVIDER_SITE_OTHER): Payer: MEDICAID | Admitting: Family Medicine

## 2023-08-15 ENCOUNTER — Encounter (INDEPENDENT_AMBULATORY_CARE_PROVIDER_SITE_OTHER): Payer: Self-pay | Admitting: Family Medicine

## 2023-08-15 VITALS — BP 108/71 | HR 86 | Temp 97.9°F | Ht 66.0 in | Wt 311.0 lb

## 2023-08-15 DIAGNOSIS — E1165 Type 2 diabetes mellitus with hyperglycemia: Secondary | ICD-10-CM

## 2023-08-15 DIAGNOSIS — E785 Hyperlipidemia, unspecified: Secondary | ICD-10-CM | POA: Diagnosis not present

## 2023-08-15 DIAGNOSIS — E1169 Type 2 diabetes mellitus with other specified complication: Secondary | ICD-10-CM | POA: Diagnosis not present

## 2023-08-15 DIAGNOSIS — Z7984 Long term (current) use of oral hypoglycemic drugs: Secondary | ICD-10-CM

## 2023-08-15 DIAGNOSIS — E559 Vitamin D deficiency, unspecified: Secondary | ICD-10-CM

## 2023-08-15 DIAGNOSIS — F321 Major depressive disorder, single episode, moderate: Secondary | ICD-10-CM

## 2023-08-15 DIAGNOSIS — E669 Obesity, unspecified: Secondary | ICD-10-CM

## 2023-08-15 DIAGNOSIS — Z6841 Body Mass Index (BMI) 40.0 and over, adult: Secondary | ICD-10-CM

## 2023-08-15 DIAGNOSIS — Z794 Long term (current) use of insulin: Secondary | ICD-10-CM

## 2023-08-15 MED ORDER — VITAMIN D (ERGOCALCIFEROL) 1.25 MG (50000 UNIT) PO CAPS: 50000.0000 [IU] | ORAL_CAPSULE | ORAL | 0 refills | Status: DC

## 2023-08-15 MED ORDER — TOPIRAMATE 50 MG PO TABS: 50.0000 mg | ORAL_TABLET | Freq: Every day | ORAL | 0 refills | Status: DC

## 2023-08-15 MED ORDER — ATORVASTATIN CALCIUM 40 MG PO TABS
40.0000 mg | ORAL_TABLET | Freq: Every day | ORAL | 3 refills | Status: DC
Start: 2023-08-15 — End: 2023-11-27

## 2023-08-15 NOTE — Assessment & Plan Note (Addendum)
 Patient is trying to get back on track with food intake control.  She saw her endocrinologist recently and was prescribed higher insulin  dose and re-prescribed her farxiga  and metformin .  Recent A1c of 9.8.  Patient reports blood sugars have been in the 200s recently since recommitting to eating more nutritiously.

## 2023-08-15 NOTE — Progress Notes (Signed)
 SUBJECTIVE:  Chief Complaint: Obesity  Interim History: Patient has let go of responsibility for her kids in terms of running around for her kids.  She has been eating [place] and chicken, salads, greens, fresh collard greens with no or little seasoning and olive oil.  She has been trying to implement mindful conscious food choices after everything happened with her kids.  She is interested in getting handouts of low carb or modified keto plan.  Tara Keller is here to discuss her progress with her obesity treatment plan. She is on the following a lower carbohydrate, vegetable and lean protein rich diet plan and states she is following her eating plan approximately 70 % of the time. She states she is walking 30 minutes 4 times per week.   OBJECTIVE: Visit Diagnoses: Problem List Items Addressed This Visit       Endocrine   Type 2 diabetes mellitus with hyperglycemia, with long-term current use of insulin  (HCC) - Primary   Patient is trying to get back on track with food intake control.  She saw her endocrinologist recently and was prescribed higher insulin  dose and re-prescribed her farxiga  and metformin .  Recent A1c of 9.8.  Patient reports blood sugars have been in the 200s recently since recommitting to eating more nutritiously.       Relevant Medications   atorvastatin  (LIPITOR) 40 MG tablet   Hyperlipidemia associated with type 2 diabetes mellitus (HCC)   Relevant Medications   atorvastatin  (LIPITOR) 40 MG tablet     Other   Morbid obesity (HCC)   Episode of moderate major depression (HCC)   Relevant Medications   topiramate  (TOPAMAX ) 50 MG tablet   Vitamin D  deficiency   Relevant Medications   Vitamin D , Ergocalciferol , (DRISDOL ) 1.25 MG (50000 UNIT) CAPS capsule   Other Visit Diagnoses       BMI 50.0-59.9, adult (HCC), CURRENT BMI 50.1           Vitals Temp: 97.9 F (36.6 C) BP: 108/71 Pulse Rate: 86 SpO2: 100 %   Anthropometric Measurements Height: 5' 6 (1.676  m) Weight: (!) 311 lb (141.1 kg) BMI (Calculated): 50.22 Weight at Last Visit: 308 lb Weight Lost Since Last Visit: 0 Weight Gained Since Last Visit: 3 Starting Weight: 311 lb Total Weight Loss (lbs): 0 lb (0 kg)   Body Composition  Body Fat %: 55.3 % Fat Mass (lbs): 172.4 lbs Muscle Mass (lbs): 132.2 lbs Total Body Water  (lbs): 99.8 lbs Visceral Fat Rating : 21   Other Clinical Data Today's Visit #: 10 Starting Date: 11/22/22 Comments: Low Carb     ASSESSMENT AND PLAN: Assessment & Plan Hyperlipidemia associated with type 2 diabetes mellitus (HCC) On atorvastatin  with no significant myalgias mentioned.  Needs refill of atorvastatin  today.  Will continue on with current dose. Vitamin D  deficiency On prescription strength vitamin D  with no nausea, and vomiting, or muscle weakness mentioned.  Refill of vitamin D  sent to pharmacy. Episode of moderate major depression (HCC) Tolerating Topamax  at current dose.  Improved mood on topiramate .  Refill of topiramate  sent to pharmacy today. Type 2 diabetes mellitus with hyperglycemia, with long-term current use of insulin  [facility]) Patient is trying to get back on track with food intake control.  She saw her endocrinologist recently and was prescribed higher insulin  dose and re-prescribed her farxiga  and metformin .  Recent A1c of 9.8.  Patient reports blood sugars have been in the 200s recently since recommitting to eating more nutritiously.  Obesity with starting BMI  of 50.3  BMI 50.0-59.9, adult (HCC), CURRENT BMI 50.1    Diet: Tara Keller is currently in the action stage of change. As such, her goal is to continue with weight loss efforts and has agreed to following a lower carbohydrate, vegetable and lean protein rich diet plan.   Exercise:  For substantial health benefits, adults should do at least 150 minutes (2 hours and 30 minutes) a week of moderate-intensity, or 75 minutes (1 hour and 15 minutes) a week of vigorous-intensity  aerobic physical activity, or an equivalent combination of moderate- and vigorous-intensity aerobic activity. Aerobic activity should be performed in episodes of at least 10 minutes, and preferably, it should be spread throughout the week.  Behavior Modification:  We discussed the following Behavioral Modification Strategies today: increasing lean protein intake, decreasing simple carbohydrates, increasing vegetables, meal planning and cooking strategies, and keeping healthy foods in the home.   Return in about 3 weeks (around 09/05/2023).   She was informed of the importance of frequent follow up visits to maximize her success with intensive lifestyle modifications for her multiple health conditions.  Attestation Statements:   Reviewed by clinician on day of visit: allergies, medications, problem list, medical history, surgical history, family history, social history, and previous encounter notes.     Tara Cho, MD

## 2023-08-27 NOTE — Assessment & Plan Note (Signed)
 Tolerating Topamax  at current dose.  Improved mood on topiramate .  Refill of topiramate  sent to pharmacy today.

## 2023-08-27 NOTE — Assessment & Plan Note (Signed)
 On prescription strength vitamin D  with no nausea, and vomiting, or muscle weakness mentioned.  Refill of vitamin D  sent to pharmacy.

## 2023-08-27 NOTE — Assessment & Plan Note (Signed)
 On atorvastatin  with no significant myalgias mentioned.  Needs refill of atorvastatin  today.  Will continue on with current dose.

## 2023-09-04 ENCOUNTER — Ambulatory Visit
Admission: RE | Admit: 2023-09-04 | Discharge: 2023-09-04 | Disposition: A | Discharge status: Home / Self Care | Payer: MEDICAID | Source: Ambulatory Visit | Attending: Nurse Practitioner | Admitting: Nurse Practitioner

## 2023-09-04 ENCOUNTER — Other Ambulatory Visit: Payer: Self-pay

## 2023-09-04 ENCOUNTER — Emergency Department (HOSPITAL_COMMUNITY): Payer: MEDICAID

## 2023-09-04 ENCOUNTER — Encounter (HOSPITAL_COMMUNITY): Payer: Self-pay

## 2023-09-04 ENCOUNTER — Telehealth: Payer: Self-pay | Admitting: Family Medicine

## 2023-09-04 ENCOUNTER — Emergency Department (HOSPITAL_COMMUNITY)
Admission: EM | Admit: 2023-09-04 | Discharge: 2023-09-05 | Disposition: A | Discharge status: Home / Self Care | Payer: MEDICAID | Attending: Emergency Medicine | Admitting: Emergency Medicine

## 2023-09-04 VITALS — BP 126/79 | HR 71 | Temp 97.4°F | Resp 22 | Ht 66.0 in | Wt 311.1 lb

## 2023-09-04 DIAGNOSIS — D72829 Elevated white blood cell count, unspecified: Secondary | ICD-10-CM | POA: Diagnosis not present

## 2023-09-04 DIAGNOSIS — Z7982 Long term (current) use of aspirin: Secondary | ICD-10-CM | POA: Diagnosis not present

## 2023-09-04 DIAGNOSIS — R112 Nausea with vomiting, unspecified: Secondary | ICD-10-CM

## 2023-09-04 DIAGNOSIS — I10 Essential (primary) hypertension: Secondary | ICD-10-CM | POA: Insufficient documentation

## 2023-09-04 DIAGNOSIS — E119 Type 2 diabetes mellitus without complications: Secondary | ICD-10-CM | POA: Insufficient documentation

## 2023-09-04 DIAGNOSIS — R1011 Right upper quadrant pain: Secondary | ICD-10-CM | POA: Diagnosis not present

## 2023-09-04 DIAGNOSIS — Z7984 Long term (current) use of oral hypoglycemic drugs: Secondary | ICD-10-CM | POA: Insufficient documentation

## 2023-09-04 DIAGNOSIS — M5441 Lumbago with sciatica, right side: Secondary | ICD-10-CM | POA: Insufficient documentation

## 2023-09-04 DIAGNOSIS — Z794 Long term (current) use of insulin: Secondary | ICD-10-CM | POA: Diagnosis not present

## 2023-09-04 DIAGNOSIS — M545 Low back pain, unspecified: Secondary | ICD-10-CM | POA: Diagnosis present

## 2023-09-04 DIAGNOSIS — Z79899 Other long term (current) drug therapy: Secondary | ICD-10-CM | POA: Insufficient documentation

## 2023-09-04 LAB — COMPREHENSIVE METABOLIC PANEL WITH GFR
ALT: 18 U/L (ref 0–44)
AST: 15 U/L (ref 15–41)
Albumin: 3.3 g/dL — ABNORMAL LOW (ref 3.5–5.0)
Alkaline Phosphatase: 151 U/L — ABNORMAL HIGH (ref 38–126)
Anion gap: 11 (ref 5–15)
BUN: 6 mg/dL (ref 6–20)
CO2: 29 mmol/L (ref 22–32)
Calcium: 9.1 mg/dL (ref 8.9–10.3)
Chloride: 100 mmol/L (ref 98–111)
Creatinine, Ser: 0.77 mg/dL (ref 0.44–1.00)
GFR, Estimated: >60 mL/min (ref >60)
Glucose, Bld: 113 mg/dL — ABNORMAL HIGH (ref 70–99)
Potassium: 3.7 mmol/L (ref 3.5–5.1)
Sodium: 140 mmol/L (ref 135–145)
Total Bilirubin: 0.3 mg/dL (ref 0.0–1.2)
Total Protein: 7.2 g/dL (ref 6.5–8.1)

## 2023-09-04 LAB — POCT URINE DIPSTICK
Bilirubin, UA: NEGATIVE
Glucose, UA: 250 mg/dL — AB
Ketones, POC UA: NEGATIVE mg/dL
Leukocytes, UA: NEGATIVE
Nitrite, UA: NEGATIVE
POC PROTEIN,UA: NEGATIVE
Spec Grav, UA: 1.015 (ref 1.010–1.025)
Urobilinogen, UA: 0.2 U/dL
pH, UA: 7.5 (ref 5.0–8.0)

## 2023-09-04 LAB — URINALYSIS, ROUTINE W REFLEX MICROSCOPIC
Bilirubin Urine: NEGATIVE
Glucose, UA: 50 mg/dL — AB
Hgb urine dipstick: NEGATIVE
Ketones, ur: NEGATIVE mg/dL
Leukocytes,Ua: NEGATIVE
Nitrite: NEGATIVE
Protein, ur: NEGATIVE mg/dL
Specific Gravity, Urine: 1.004 — ABNORMAL LOW (ref 1.005–1.030)
pH: 8 (ref 5.0–8.0)

## 2023-09-04 LAB — CBC WITH DIFFERENTIAL/PLATELET
Abs Immature Granulocytes: 0.04 K/uL (ref 0.00–0.07)
Basophils Absolute: 0 K/uL (ref 0.0–0.1)
Basophils Relative: 0 %
Eosinophils Absolute: 0.2 K/uL (ref 0.0–0.5)
Eosinophils Relative: 2 %
HCT: 39.1 % (ref 36.0–46.0)
Hemoglobin: 11.9 g/dL — ABNORMAL LOW (ref 12.0–15.0)
Immature Granulocytes: 1 %
Lymphocytes Relative: 45 %
Lymphs Abs: 3.5 K/uL (ref 0.7–4.0)
MCH: 26.9 pg (ref 26.0–34.0)
MCHC: 30.4 g/dL (ref 30.0–36.0)
MCV: 88.3 fL (ref 80.0–100.0)
Monocytes Absolute: 0.8 K/uL (ref 0.1–1.0)
Monocytes Relative: 10 %
Neutro Abs: 3.3 K/uL (ref 1.7–7.7)
Neutrophils Relative %: 42 %
Platelets: 257 K/uL (ref 150–400)
RBC: 4.43 MIL/uL (ref 3.87–5.11)
RDW: 13.1 % (ref 11.5–15.5)
Smear Review: NORMAL
WBC: 7.8 K/uL (ref 4.0–10.5)
nRBC: 0 % (ref 0.0–0.2)

## 2023-09-04 LAB — CBG MONITORING, ED: Glucose-Capillary: 116 mg/dL — ABNORMAL HIGH (ref 70–99)

## 2023-09-04 LAB — LIPASE, BLOOD: Lipase: 22 U/L (ref 11–51)

## 2023-09-04 MED ORDER — IOHEXOL 350 MG/ML SOLN
100.0000 mL | Freq: Once | INTRAVENOUS | Status: AC | PRN
  Administered 2023-09-04: 100 mL via INTRAVENOUS

## 2023-09-04 NOTE — ED Triage Notes (Signed)
 Pt reports back pain, RUQ abdominal pain and vomiting; onset 3 days ago. She states that no matter what she eats she vomits it up. She also verbalized concern about elevated blood sugars as well. She was sent here from Urgent Care.

## 2023-09-04 NOTE — ED Notes (Signed)
 Patient came to the desk asking why she has not been seen yet. Apologized for her wait. I advised she would be seen as soon as a bed became available to her.

## 2023-09-04 NOTE — ED Triage Notes (Signed)
 This started with my stomach hurting on Saturday, Sunday it got worse and then couldn't eat, when I tried I was vomiting, following this started with right lower back pain that radiated up my back. Last emesis last night. My last glucose was 350 @ 1000. I am a diabetic so this concerns me. I took my insulin  shortly after the high reading.

## 2023-09-04 NOTE — Telephone Encounter (Signed)
 A document form has been faxed: request for prior approval, to be filled out by provider. Send document back via Fax within 7-days. Document is located in providers tray at front office.          Fax number: [phone]

## 2023-09-04 NOTE — ED Triage Notes (Signed)
 Patient is being discharged from the Urgent Care and sent to the Emergency Department via Private Vehicle (Self) . Per Provider, patient is in need of higher level of care due to Pain. Patient is aware and verbalizes understanding of plan of care.  Vitals:   09/04/23 1305  BP: 126/79  Pulse: 71  Resp: (!) 22  Temp: (!) 97.4 F (36.3 C)  SpO2: 94%
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
## 2023-09-04 NOTE — ED Triage Notes (Signed)
 My paper address in my back is bothering me so that's why I need to come in to see someone - Entered by patient

## 2023-09-04 NOTE — Discharge Instructions (Signed)
 Please proceed to the Emergency Department for further evaluation.

## 2023-09-04 NOTE — ED Provider Triage Note (Signed)
 Emergency Medicine Provider Triage Evaluation Note  Tara Keller , a 58 y.o. female  was evaluated in triage.  Pt complains of abd pain. R side abd pain ongoing x 4 days.  Endorse nausea, decreased appetite, can't seem to keep her food down.  No fever, chills, cp, sob, cough, dysuria.  Prior cholecystectomy.  Mentioned her CBG is elevated  Review of Systems  Positive: As above Negative: As above  Physical Exam  BP (!) 151/74 (BP Location: Right Arm)   Pulse 67   Temp (!) 97 F (36.1 C)   Resp 19   Ht 5' 6 (1.676 m)   Wt (!) 143.8 kg   LMP 01/23/2014 (Approximate)   SpO2 96%   BMI 51.17 kg/m  Gen:   Awake, no distress   Resp:  Normal effort  MSK:   Moves extremities without difficulty  Other:    Medical Decision Making  Medically screening exam initiated at 5:04 PM.  Appropriate orders placed.  Tara Keller was informed that the remainder of the evaluation will be completed by another provider, this initial triage assessment does not replace that evaluation, and the importance of remaining in the ED until their evaluation is complete.     Nivia Colon, PA-C 09/04/23 1705

## 2023-09-05 MED ORDER — KETOROLAC TROMETHAMINE 15 MG/ML IJ SOLN
15.0000 mg | Freq: Once | INTRAMUSCULAR | Status: AC
  Administered 2023-09-05: 15 mg via INTRAVENOUS

## 2023-09-05 MED ORDER — KETOROLAC TROMETHAMINE 15 MG/ML IJ SOLN
15.0000 mg | Freq: Once | INTRAMUSCULAR | Status: DC
  Filled 2023-09-05: qty 1

## 2023-09-05 MED ORDER — ONDANSETRON 4 MG PO TBDP
4.0000 mg | ORAL_TABLET | Freq: Once | ORAL | Status: AC
  Administered 2023-09-05: 4 mg via ORAL
  Filled 2023-09-05: qty 1

## 2023-09-05 MED ORDER — NAPROXEN 500 MG PO TABS: 500.0000 mg | ORAL_TABLET | Freq: Two times a day (BID) | ORAL | 0 refills | Status: DC

## 2023-09-05 MED ORDER — CYCLOBENZAPRINE HCL 10 MG PO TABS: 10.0000 mg | ORAL_TABLET | Freq: Two times a day (BID) | ORAL | 0 refills | Status: AC | PRN

## 2023-09-05 MED ORDER — DEXAMETHASONE SODIUM PHOSPHATE 10 MG/ML IJ SOLN
10.0000 mg | Freq: Once | INTRAMUSCULAR | Status: DC
  Filled 2023-09-05: qty 1

## 2023-09-05 MED ORDER — DEXAMETHASONE SODIUM PHOSPHATE 10 MG/ML IJ SOLN
10.0000 mg | Freq: Once | INTRAMUSCULAR | Status: AC
  Administered 2023-09-05: 10 mg via INTRAVENOUS

## 2023-09-05 NOTE — Telephone Encounter (Signed)
 Placed in Dr. Luigi folder for review and signature

## 2023-09-05 NOTE — ED Provider Notes (Signed)
 [facility] Provider Note   CSN: [id number] Arrival date & time: 09/04/23  1604     Patient presents with: Back Pain, Hyperglycemia, and Abdominal Pain   Tara Keller is a 58 y.o. female with extensive medical history to include morbid obesity, GAD, GERD, lumbar radiculopathy, Barrett's esophagus, type 2 diabetes, hypertension, right hip pain, pancreatitis.  Patient presents to ED for evaluation of multiple complaints.  Patient coming in with abdominal pain, nausea, hyperglycemia and back pain.  Patient reports that on Saturday she developed nausea, vomiting which is persistent since onset.  She states anytime she attempts to consume food she will vomit it right back up.  She states she has abdominal pain in her right lower quadrant but on further examination she reports that she has right low back pain which radiates into her right leg as well as her right lower abdomen.  She reports a history of back pain.  She has not taken medications for this.  She denies any bowel or bladder incontinence, distal weakness or groin numbness.  She denies any diarrhea, fevers, urinary symptoms, chest pain or shortness of breath.  She reports that she checked her CBG at home prior to going to urgent care and noted to be 350.  She took 22 units of NovoLog  at this time.  She reports compliance on diabetic medications.  She denies polyuria, polydipsia, polyphagia.  She denies alcohol use.   Back Pain Associated symptoms: abdominal pain   Hyperglycemia Associated symptoms: abdominal pain   Abdominal Pain      Prior to Admission medications   Medication Sig Start Date End Date Taking? Authorizing Provider  cyclobenzaprine  (FLEXERIL ) 10 MG tablet Take 1 tablet (10 mg total) by mouth 2 (two) times daily as needed for muscle spasms. 09/05/23  Yes Ruthell Lonni FALCON, PA-C  naproxen  (NAPROSYN ) 500 MG tablet Take 1 tablet (500 mg total) by mouth 2 (two) times daily.  09/05/23  Yes Ruthell Lonni FALCON, PA-C  Accu-Chek FastClix Lancets MISC USE TO TEST BLOOD SUGAR THREE TIMES DAILY 06/05/23   Shamleffer, Ibtehal Jaralla, MD  albuterol  (VENTOLIN  HFA) 108 (90 Base) MCG/ACT inhaler Inhale 1-2 puffs into the lungs every 6 (six) hours as needed for wheezing or shortness of breath. 03/04/23   Mecum, Erin E, PA-C  aspirin  EC 81 MG tablet Take 1 tablet (81 mg total) by mouth daily. 01/11/22   Cross, Melissa D, NP  atorvastatin  (LIPITOR) 40 MG tablet Take 1 tablet (40 mg total) by mouth daily. 08/15/23   Berkeley Adelita PENNER, MD  baclofen  (LIORESAL ) 10 MG tablet Take 1 tablet (10 mg total) by mouth 2 (two) times daily. 06/05/23   Raspet, Erin K, PA-C  Blood Glucose Monitoring Suppl (ACCU-CHEK GUIDE ME) w/Device KIT 48 Units by Does not apply route 2 (two) times daily. 02/06/18   Newlin, Enobong, MD  buPROPion  (WELLBUTRIN  XL) 300 MG 24 hr tablet Take 1 tablet (300 mg total) by mouth daily. 07/19/21   Tanda Bleacher, MD  busPIRone  (BUSPAR ) 7.5 MG tablet Take 7.5 mg by mouth 2 (two) times daily. 10/25/21   [provider]  Continuous Glucose Sensor (DEXCOM G7 SENSOR) MISC 1 Device by Does not apply route as directed. Change sensors every 10 days 04/02/23   Shamleffer, Ibtehal Jaralla, MD  dapagliflozin  propanediol (FARXIGA ) 10 MG TABS tablet Take 1 tablet (10 mg total) by mouth daily. 08/01/23   Shamleffer, Ibtehal Jaralla, MD  diclofenac  Sodium (VOLTAREN ) 1 % GEL Apply  topically as directed. 08/20/18   [provider]  DULoxetine  (CYMBALTA ) 20 MG capsule Take 20 mg by mouth daily. Patient taking differently: Take 30 mg by mouth daily. 09/18/22   [provider]  fluticasone  (FLONASE ) 50 MCG/ACT nasal spray Place 1 spray into both nostrils daily. 12/04/22   Hazen Darryle BRAVO, FNP  gabapentin  (NEURONTIN ) 300 MG capsule Take 1 capsule (300 mg total) by mouth 3 (three) times daily. Patient taking differently: Take 300 mg by mouth 3 (three) times daily. TAKES PRN 06/28/22    Tanda Bleacher, MD  glipiZIDE  (GLUCOTROL  XL) 5 MG 24 hr tablet Take 5 mg by mouth daily. 06/23/23   [provider]  glucose blood (ACCU-CHEK GUIDE TEST) test strip 1 each by Other route in the morning, at noon, in the evening, and at bedtime. Use as instructed 12/26/22   Shamleffer, Ibtehal Jaralla, MD  glucose blood (ACCU-CHEK GUIDE) test strip Three times a day 06/28/22   Tanda Bleacher, MD  hydrOXYzine  (VISTARIL ) 25 MG capsule Take 1 capsule (25 mg total) by mouth 3 (three) times daily as needed. 07/19/21   Tanda Bleacher, MD  ibuprofen  (ADVIL ) 600 MG tablet Take 1 tablet (600 mg total) by mouth every 8 (eight) hours as needed (pain). 03/03/23   Vonna Sharlet POUR, MD  insulin  aspart (NOVOLOG  FLEXPEN) 100 UNIT/ML FlexPen Inject 18-30 Units into the skin 3 (three) times daily with meals. Max daily 80 units Patient taking differently: Inject 22 Units into the skin 3 (three) times daily with meals. Max daily 80 units 08/01/23   Shamleffer, Ibtehal Jaralla, MD  insulin  glargine (LANTUS  SOLOSTAR) 100 UNIT/ML Solostar Pen Inject 84 Units into the skin daily. 08/01/23   Shamleffer, Ibtehal Jaralla, MD  Insulin  Pen Needle 31G X 5 MM MISC 1 Device by Does not apply route in the morning, at noon, in the evening, and at bedtime. 08/01/23   Shamleffer, Ibtehal Jaralla, MD  lidocaine  (LIDODERM ) 5 % Place 1 patch onto the skin daily. Remove & Discard patch within 12 hours or as directed by MD 06/05/23   Raspet, Rocky K, PA-C  lisinopril  (ZESTRIL ) 10 MG tablet TAKE 1 TABLET(10 MG) BY MOUTH DAILY 03/27/23   Shamleffer, Ibtehal Jaralla, MD  Melatonin ER 5 MG TBCR Take 5 mg by mouth at bedtime.    [provider]  meloxicam  (MOBIC ) 15 MG tablet Take 1 tablet (15 mg total) by mouth daily. 06/05/23   Raspet, Rocky POUR, PA-C  metFORMIN  (GLUCOPHAGE -XR) 750 MG 24 hr tablet Take 1 tablet (750 mg total) by mouth in the morning and at bedtime. 08/01/23   Shamleffer, Ibtehal Jaralla, MD  ondansetron  (ZOFRAN ) 4 MG  tablet Take 1 tablet (4 mg total) by mouth every 6 (six) hours. 11/05/22   Nivia Colon, PA-C  pantoprazole  (PROTONIX ) 40 MG tablet Take 1 tablet (40 mg total) by mouth 2 (two) times daily. 06/28/22   Tanda Bleacher, MD  polyethylene glycol powder (GLYCOLAX /MIRALAX ) 17 GM/SCOOP powder Take 17 g by mouth daily as needed for constipation. 07/05/20   [provider]  propranolol  (INDERAL ) 10 MG tablet Take 1 tablet (10 mg total) by mouth 2 (two) times daily. TAKE 1 TABLET(10 MG) BY MOUTH TWICE DAILY AS NEEDED Strength: 10 mg 06/28/22   Tanda Bleacher, MD  sucralfate  (CARAFATE ) 1 g tablet TAKE 1 TABLET(1 GRAM) BY MOUTH FOUR TIMES DAILY BEFORE MEALS AND AT BEDTIME 09/01/20   Rehman, Areeg N, DO  SURE COMFORT INSULIN  SYRINGE 31G X 5/16 1 ML MISC Use to  inject insulin  daily 03/04/20   Shamleffer, Ibtehal Jaralla, MD  topiramate  (TOPAMAX ) 50 MG tablet Take 1 tablet (50 mg total) by mouth daily. 08/15/23   Berkeley Adelita PENNER, MD  traZODone  (DESYREL ) 50 MG tablet Take 50 mg by mouth at bedtime as needed. 07/12/23   [provider]  Vitamin D , Ergocalciferol , (DRISDOL ) 1.25 MG (50000 UNIT) CAPS capsule Take 1 capsule (50,000 Units total) by mouth every 7 (seven) days. 08/15/23   Berkeley Adelita PENNER, MD    Allergies: Benadryl  [diphenhydramine ]    Review of Systems  Gastrointestinal:  Positive for abdominal pain.  Musculoskeletal:  Positive for back pain.  All other systems reviewed and are negative.   Updated Vital Signs BP 126/71   Pulse 74   Temp (!) 97 F (36.1 C)   Resp 16   Ht 5' 6 (1.676 m)   Wt (!) 143.8 kg   LMP 01/23/2014 (Approximate)   SpO2 100%   BMI 51.17 kg/m   Physical Exam Vitals and nursing note reviewed.  Constitutional:      General: She is not in acute distress.    Appearance: She is well-developed. She is not toxic-appearing.  HENT:     Head: Normocephalic and atraumatic.  Eyes:     Conjunctiva/sclera: Conjunctivae normal.  Cardiovascular:     Rate and  Rhythm: Normal rate and regular rhythm.     Heart sounds: No murmur heard. Pulmonary:     Effort: Pulmonary effort is normal. No respiratory distress.     Breath sounds: Normal breath sounds.  Abdominal:     Palpations: Abdomen is soft.     Tenderness: There is no abdominal tenderness.     Comments: Abdomen soft and nontender  Musculoskeletal:        General: No swelling.     Cervical back: Neck supple.       Back:     Right lower leg: No edema.     Left lower leg: No edema.  Skin:    General: Skin is warm and dry.     Capillary Refill: Capillary refill takes less than 2 seconds.     Coloration: Skin is not jaundiced or pale.  Neurological:     Mental Status: She is alert and oriented to person, place, and time.  Psychiatric:        Mood and Affect: Mood normal.        Behavior: Behavior normal.     (all labs ordered are listed, but only abnormal results are displayed) Labs Reviewed  CBC WITH DIFFERENTIAL/PLATELET - Abnormal; Notable for the following components:      Result Value   Hemoglobin 11.9 (*)    All other components within normal limits  COMPREHENSIVE METABOLIC PANEL WITH GFR - Abnormal; Notable for the following components:   Glucose, Bld 113 (*)    Albumin 3.3 (*)    Alkaline Phosphatase 151 (*)    All other components within normal limits  URINALYSIS, ROUTINE W REFLEX MICROSCOPIC - Abnormal; Notable for the following components:   Color, Urine STRAW (*)    Specific Gravity, Urine 1.004 (*)    Glucose, UA 50 (*)    All other components within normal limits  CBG MONITORING, ED - Abnormal; Notable for the following components:   Glucose-Capillary 116 (*)    All other components within normal limits  LIPASE, BLOOD    EKG: None  Radiology: CT ABDOMEN PELVIS W CONTRAST Result Date: 09/04/2023 CLINICAL DATA:  Right lower quadrant abdominal  pain EXAM: CT ABDOMEN AND PELVIS WITH CONTRAST TECHNIQUE: Multidetector CT imaging of the abdomen and pelvis was  performed using the standard protocol following bolus administration of intravenous contrast. RADIATION DOSE REDUCTION: This exam was performed according to the departmental dose-optimization program which includes automated exposure control, adjustment of the mA and/or kV according to patient size and/or use of iterative reconstruction technique. CONTRAST:  OMNIPAQUE  IOHEXOL  350 MG/ML SOLN COMPARISON:  02/05/2023 FINDINGS: Lower chest: Mm left lower lobe nodule on image 7 series 5, no change from 11/12/2020 indicating benign etiology. No further imaging workup of this lesion is indicated. Hepatobiliary: Nodular contour the liver suggesting cirrhosis. Geographic hypodensity in the liver probably from steatosis. I do not observe a focal liver mass. Cholecystectomy. Pancreas: Unremarkable Spleen: Unremarkable Adrenals/Urinary Tract: 2.2 by 1.6 cm benign cyst of the right kidney lower pole, image 21 series 8. No further imaging workup of this lesion is indicated. Adrenal glands unremarkable. Stomach/Bowel: Unremarkable.  Normal appendix. Vascular/Lymphatic: Mild abdominal aortic atherosclerotic vascular calcification. Reproductive: Uterus absent.  Adnexa unremarkable. Other: No supplemental non-categorized findings. Musculoskeletal: Lower thoracic spondylosis. IMPRESSION: 1. A specific cause for the patient's right lower quadrant abdominal pain is not identified. The appendix appears normal. 2. Nodular contour of the liver suggesting cirrhosis. Geographic hypodensity in the liver probably from steatosis. 3. Lower thoracic spondylosis. 4.  Aortic Atherosclerosis (ICD10-I70.0). Electronically Signed   By: Ryan Salvage M.D.   On: 09/04/2023 20:00    Procedures   Medications Ordered in the ED  iohexol  (OMNIPAQUE ) 350 MG/ML injection 100 mL (100 mLs Intravenous Contrast Given 09/04/23 1923)  ondansetron  (ZOFRAN -ODT) disintegrating tablet 4 mg (4 mg Oral Given 09/05/23 0200)  ketorolac  (TORADOL ) 15 MG/ML  injection 15 mg (15 mg Intravenous Given 09/05/23 0201)  dexamethasone  (DECADRON ) injection 10 mg (10 mg Intravenous Given 09/05/23 0200)    Medical Decision Making Risk Prescription drug management.   This is a 58 year old female who presents for out of concern of hyperglycemia, abdominal pain, low back pain.  On examination, she is afebrile, nontachycardic.  Her lung sounds are clear bilaterally, she is not hypoxic.  Abdomen is soft and compressible with no tenderness.  Low back has tenderness in the right paralumbar muscles but no centralized lumbar spinal tenderness.  She has 5 out of 5 strength of bilateral lower extremities.  Neurological examination at baseline.  Overall hemodynamically stable, no apparent distress.  Patient labwork initiated in triage include CBC, CMP, urinalysis, CBG, lipase and CT scan of abdomen.  CBC with leukocytosis, no anemia.  Metabolic panel grossly unremarkable, elevated alk phos at 151 but this is baseline.  Anion gap 11.  No electrolyte derangement.  Urinalysis shows glucose however no nitrites, leukocytes.  Lipase 22, CT scan of abdomen is unremarkable and shows no signs of pancreatitis or other intra-abdominal pathology.  CBG here 116.  At this time, patient reports that her right low back pain radiates to her right lower quadrant of her abdomen.  CT scan shows no appendicitis.  She has no white count.  She passed p.o. fluid challenge here and is able to tolerate p.o. intake without difficulty.  She will be discharged home with Zofran , naproxen  and Flexeril  for her low back pain.  She was advised to follow-up with PCP.  Advised to return to the ED with new symptoms.  She voiced understanding.  Stable to discharge.   Final diagnoses:  Acute right-sided low back pain with right-sided sciatica    ED Discharge Orders  Ordered    naproxen  (NAPROSYN ) 500 MG tablet  2 times daily        09/05/23 0225    cyclobenzaprine  (FLEXERIL ) 10 MG tablet  2 times  daily PRN        09/05/23 0225               Ruthell Lonni FALCON, PA-C 09/05/23 [id number]    Jerral Meth, MD 09/05/23 [phone]

## 2023-09-05 NOTE — Discharge Instructions (Addendum)
 It was a pleasure taking part in your care.  As we discussed, your CT scan shows no evidence of pancreatitis, your lipase here is normal.  CT scan also shows no evidence of appendicitis.  I believe that your right-sided low back pain is causing your symptoms.  Please begin taking a Proxen twice a day, Flexeril  twice a day for pain.  Please do not drive or operate machinery, mix with alcohol while taking Flexeril .  Please follow-up with your PCP.  Continue taking all your diabetic medications as prescribed.  Return to the ED with any new symptoms.

## 2023-09-06 ENCOUNTER — Encounter (INDEPENDENT_AMBULATORY_CARE_PROVIDER_SITE_OTHER): Payer: Self-pay | Admitting: Family Medicine

## 2023-09-06 ENCOUNTER — Ambulatory Visit (INDEPENDENT_AMBULATORY_CARE_PROVIDER_SITE_OTHER): Payer: MEDICAID | Admitting: Family Medicine

## 2023-09-06 VITALS — BP 107/65 | HR 74 | Temp 98.2°F | Ht 66.0 in | Wt 312.0 lb

## 2023-09-06 DIAGNOSIS — Z794 Long term (current) use of insulin: Secondary | ICD-10-CM | POA: Diagnosis not present

## 2023-09-06 DIAGNOSIS — Z6841 Body Mass Index (BMI) 40.0 and over, adult: Secondary | ICD-10-CM | POA: Diagnosis not present

## 2023-09-06 DIAGNOSIS — E1165 Type 2 diabetes mellitus with hyperglycemia: Secondary | ICD-10-CM | POA: Diagnosis not present

## 2023-09-06 DIAGNOSIS — E669 Obesity, unspecified: Secondary | ICD-10-CM

## 2023-09-06 DIAGNOSIS — Z7984 Long term (current) use of oral hypoglycemic drugs: Secondary | ICD-10-CM

## 2023-09-06 DIAGNOSIS — E559 Vitamin D deficiency, unspecified: Secondary | ICD-10-CM

## 2023-09-06 MED ORDER — VITAMIN D (ERGOCALCIFEROL) 1.25 MG (50000 UNIT) PO CAPS: 50000.0000 [IU] | ORAL_CAPSULE | ORAL | 0 refills | Status: DC

## 2023-09-06 NOTE — Assessment & Plan Note (Addendum)
 Fasting blood sugars in the 200s now (down from 300).  She knows it is the food intake and quantity of carbohydrates she is eating that is increasing her blood sugars.  She wants to increase her nutritious food intake when she is able to get back to the grocery store.  We discussed what classifies as a carbohydrate and how to it affects blood sugar- vegetables that are non starchy do not affect blood sugar/ increase sugar the same as breads.  Discussed how if she is interested in following a low carb plan she needs to limit her total intake of carbs to 70g a day.

## 2023-09-06 NOTE — Progress Notes (Signed)
 SUBJECTIVE:  Chief Complaint: Obesity  Interim History: Patient has been working on getting herself together.  She went to the hospital recently due to concern that her pancreatitis may be acting up.  She was diagnosed with sciatica and was given a muscle relaxer and neurontin .  She mentions her finances were tight this month so she didn't get everything she wanted to get but was able to get some nutritious food.  She has plans to go back to the store in 4 doors.   [facility] is here to discuss her progress with her obesity treatment plan. She is on the following a lower carbohydrate, vegetable and lean protein rich diet plan and states she is following her eating plan approximately 50 % of the time. She states she is walking and doing chores.   OBJECTIVE: Visit Diagnoses: Problem List Items Addressed This Visit   None   Vitals Temp: 98.2 F (36.8 C) BP: 107/65 Pulse Rate: 74 SpO2: 100 %   Anthropometric Measurements Height: 5' 6 (1.676 m) Weight: (!) 312 lb (141.5 kg) BMI (Calculated): 50.38 Weight at Last Visit: 311 lb Weight Lost Since Last Visit: 0 Weight Gained Since Last Visit: 1 Starting Weight: 311 lb Total Weight Loss (lbs): 0 lb (0 kg)   Body Composition  Body Fat %: 56.9 % Fat Mass (lbs): 177.8 lbs Muscle Mass (lbs): 127.8 lbs Visceral Fat Rating : 22   Other Clinical Data Today's Visit #: 11 Starting Date: 11/22/22 Comments: Low carb     ASSESSMENT AND PLAN: Assessment & Plan Type 2 diabetes mellitus with hyperglycemia, with long-term current use of insulin  (HCC) Fasting blood sugars in the 200s now (down from 300).  She knows it is the food intake and quantity of carbohydrates she is eating that is increasing her blood sugars.  She wants to increase her nutritious food intake when she is able to get back to the grocery store.  We discussed what classifies as a carbohydrate and how to it affects blood sugar- vegetables that are non starchy do not affect  blood sugar/ increase sugar the same as breads.  Discussed how if she is interested in following a low carb plan she needs to limit her total intake of carbs to 70g a day.  Vitamin D  deficiency Patient needs a refill of prescription strength Vitamin D  today.  No nausea, vomiting or muscle weakness mentioned.  Continue current treatment plan without change. Obesity with starting BMI of 50.3  BMI 50.0-59.9, adult (HCC), CURRENT BMI 50.1    Diet: Cieanna is currently in the action stage of change. As such, her goal is to continue with weight loss efforts and has agreed to following a lower carbohydrate, vegetable and lean protein rich diet plan.   Exercise:  For substantial health benefits, adults should do at least 150 minutes (2 hours and 30 minutes) a week of moderate-intensity, or 75 minutes (1 hour and 15 minutes) a week of vigorous-intensity aerobic physical activity, or an equivalent combination of moderate- and vigorous-intensity aerobic activity. Aerobic activity should be performed in episodes of at least 10 minutes, and preferably, it should be spread throughout the week.  Behavior Modification:  We discussed the following Behavioral Modification Strategies today: increasing lean protein intake, decreasing simple carbohydrates, increasing vegetables, meal planning and cooking strategies, and planning for success.   No follow-ups on file.   She was informed of the importance of frequent follow up visits to maximize her success with intensive lifestyle modifications for her multiple  health conditions.  Attestation Statements:   Reviewed by clinician on day of visit: allergies, medications, problem list, medical history, surgical history, family history, social history, and previous encounter notes.     Adelita Cho, MD

## 2023-09-11 NOTE — Telephone Encounter (Signed)
 Signed by Dr. Tanda and faxed back to [facility]

## 2023-09-14 ENCOUNTER — Telehealth: Payer: Self-pay | Admitting: Family Medicine

## 2023-09-14 ENCOUNTER — Encounter: Payer: Self-pay | Admitting: Podiatry

## 2023-09-14 ENCOUNTER — Ambulatory Visit (INDEPENDENT_AMBULATORY_CARE_PROVIDER_SITE_OTHER): Payer: MEDICAID | Admitting: Podiatry

## 2023-09-14 DIAGNOSIS — M79675 Pain in left toe(s): Secondary | ICD-10-CM | POA: Diagnosis not present

## 2023-09-14 DIAGNOSIS — Z794 Long term (current) use of insulin: Secondary | ICD-10-CM

## 2023-09-14 DIAGNOSIS — E1142 Type 2 diabetes mellitus with diabetic polyneuropathy: Secondary | ICD-10-CM

## 2023-09-14 DIAGNOSIS — B351 Tinea unguium: Secondary | ICD-10-CM | POA: Diagnosis not present

## 2023-09-14 DIAGNOSIS — M79674 Pain in right toe(s): Secondary | ICD-10-CM

## 2023-09-14 NOTE — Progress Notes (Signed)
Complaint:  Visit Type: Patient returns to my office for continued preventative foot care services. Complaint: Patient states" my nails have grown long and thick and become painful to walk and wear shoes" Patient has been diagnosed with DM with neuropathy. The patient presents for preventative foot care services.  Podiatric Exam: Vascular: dorsalis pedis and posterior tibial pulses are palpable bilateral. Capillary return is immediate. Temperature gradient is WNL. Skin turgor WNL  Sensorium: Diminished  Semmes Weinstein monofilament test. Normal tactile sensation bilaterally. Nail Exam: Pt has thick disfigured discolored nails with subungual debris noted bilateral entire nail hallux through fifth toenails Ulcer Exam: There is no evidence of ulcer or pre-ulcerative changes or infection. Orthopedic Exam: Muscle tone and strength are WNL. No limitations in general ROM. No crepitus or effusions noted. Foot type and digits show no abnormalities. HAV  B/L. Skin: No Porokeratosis. No infection or ulcers  Diagnosis:  Onychomycosis, , Pain in right toe, pain in left toes  Treatment & Plan Procedures and Treatment: Consent by patient was obtained for treatment procedures.   Debridement of mycotic and hypertrophic toenails, 1 through 5 bilateral and clearing of subungual debris. No ulceration, no infection noted.  Return Visit-Office Procedure: Patient instructed to return to the office for a follow up visit 3 months for continued evaluation and treatment.    Isley Weisheit DPM 

## 2023-09-14 NOTE — Telephone Encounter (Signed)
 A document form from [employer] has been faxed: [place] [employer] Request for Prior Approval second request, to be filled out by provider. Send document back via Fax within ASAP. Document is located in providers tray at front office.

## 2023-09-14 NOTE — Telephone Encounter (Signed)
 Called [facility] to confirm fax number. Found out I had the wrong fax number for them first time it was sent, resent it to new/ correct fax number

## 2023-09-15 NOTE — Assessment & Plan Note (Signed)
 Patient needs a refill of prescription strength Vitamin D  today.  No nausea, vomiting or muscle weakness mentioned.  Continue current treatment plan without change.

## 2023-09-24 ENCOUNTER — Ambulatory Visit (INDEPENDENT_AMBULATORY_CARE_PROVIDER_SITE_OTHER): Payer: MEDICAID | Admitting: Family Medicine

## 2023-09-24 VITALS — BP 119/81 | HR 83 | Ht 66.0 in | Wt 313.0 lb

## 2023-09-24 DIAGNOSIS — Z794 Long term (current) use of insulin: Secondary | ICD-10-CM

## 2023-09-24 DIAGNOSIS — Z6841 Body Mass Index (BMI) 40.0 and over, adult: Secondary | ICD-10-CM

## 2023-09-24 DIAGNOSIS — I10 Essential (primary) hypertension: Secondary | ICD-10-CM | POA: Diagnosis not present

## 2023-09-24 DIAGNOSIS — H9193 Unspecified hearing loss, bilateral: Secondary | ICD-10-CM

## 2023-09-24 DIAGNOSIS — Z7689 Persons encountering health services in other specified circumstances: Secondary | ICD-10-CM

## 2023-09-24 DIAGNOSIS — E66813 Obesity, class 3: Secondary | ICD-10-CM

## 2023-09-24 DIAGNOSIS — E1169 Type 2 diabetes mellitus with other specified complication: Secondary | ICD-10-CM

## 2023-09-24 MED ORDER — PHENTERMINE HCL 37.5 MG PO TABS
37.5000 mg | ORAL_TABLET | Freq: Every day | ORAL | 0 refills | Status: DC
Start: 2023-09-24 — End: 2023-11-12

## 2023-09-24 MED ORDER — ONDANSETRON HCL 4 MG PO TABS: 4.0000 mg | ORAL_TABLET | Freq: Four times a day (QID) | ORAL | 0 refills | Status: AC

## 2023-09-24 MED ORDER — ACCU-CHEK GUIDE TEST VI STRP: 1.0000 | ORAL_STRIP | Freq: Four times a day (QID) | 3 refills | Status: AC

## 2023-09-24 MED ORDER — ACCU-CHEK FASTCLIX LANCETS MISC: 3 refills | Status: AC

## 2023-09-26 ENCOUNTER — Encounter: Payer: Self-pay | Admitting: Family Medicine
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
## 2023-10-02 ENCOUNTER — Telehealth: Payer: Self-pay | Admitting: Internal Medicine

## 2023-10-02 ENCOUNTER — Other Ambulatory Visit (HOSPITAL_COMMUNITY): Payer: Self-pay

## 2023-10-02 ENCOUNTER — Telehealth: Payer: Self-pay

## 2023-10-02 NOTE — Telephone Encounter (Signed)
 Pharmacy Patient Advocate Encounter   Received notification from Pt Calls Messages that prior authorization for Dexcom G7 sensor is required/requested.   Insurance verification completed.   The patient is insured through [facility] MEDICAID .   Per test claim: PA required; PA submitted to above mentioned insurance via Latent Key/confirmation #/EOC [id number] Status is pending

## 2023-10-02 NOTE — Telephone Encounter (Signed)
 Patient came in to office today and is stating that the pharmacy is telling her that a prior authorization is needed for Continuous Glucose Sensor (DEXCOM G7 SENSOR) MISC.  Patient states that her current sensor will expire tomorrow 10/03/2023.  Patient would like to know if she can get a sample while she is here.  Her current pharmacy of choice is [id number] - [place], [place] - [address] AT [facility] [address] (Ph: [phone])

## 2023-10-02 NOTE — Telephone Encounter (Signed)
 Pharmacy Patient Advocate Encounter  Received notification from [facility] MEDICAID that Prior Authorization for Dexcom G7 sensor has been APPROVED from 10/02/23 to 10/01/24

## 2023-10-04 ENCOUNTER — Encounter (INDEPENDENT_AMBULATORY_CARE_PROVIDER_SITE_OTHER): Payer: Self-pay | Admitting: Family Medicine

## 2023-10-04 ENCOUNTER — Ambulatory Visit (INDEPENDENT_AMBULATORY_CARE_PROVIDER_SITE_OTHER): Payer: MEDICAID | Admitting: Family Medicine

## 2023-10-04 DIAGNOSIS — F329 Major depressive disorder, single episode, unspecified: Secondary | ICD-10-CM | POA: Diagnosis not present

## 2023-10-04 DIAGNOSIS — E1165 Type 2 diabetes mellitus with hyperglycemia: Secondary | ICD-10-CM

## 2023-10-04 DIAGNOSIS — Z7984 Long term (current) use of oral hypoglycemic drugs: Secondary | ICD-10-CM

## 2023-10-04 DIAGNOSIS — E669 Obesity, unspecified: Secondary | ICD-10-CM

## 2023-10-04 DIAGNOSIS — Z794 Long term (current) use of insulin: Secondary | ICD-10-CM | POA: Diagnosis not present

## 2023-10-04 DIAGNOSIS — Z6841 Body Mass Index (BMI) 40.0 and over, adult: Secondary | ICD-10-CM
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
## 2023-10-04 NOTE — Assessment & Plan Note (Addendum)
 Has Dexcom   30 day average 263, 2% in range, 45% high, 53% very high 14 day average 263, 2% in range, 45% high, 53% very high 7 and 3 day averages the same  She is working out issues with pharmacy and her physician as to why it is taking so long for her to get Dexcom supplies.
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
## 2023-10-29 ENCOUNTER — Ambulatory Visit (INDEPENDENT_AMBULATORY_CARE_PROVIDER_SITE_OTHER): Admitting: Nurse Practitioner

## 2023-11-05 ENCOUNTER — Other Ambulatory Visit (HOSPITAL_COMMUNITY): Payer: Self-pay

## 2023-11-05 ENCOUNTER — Ambulatory Visit (INDEPENDENT_AMBULATORY_CARE_PROVIDER_SITE_OTHER): Payer: MEDICAID | Admitting: Internal Medicine

## 2023-11-05 ENCOUNTER — Telehealth: Payer: Self-pay

## 2023-11-05 ENCOUNTER — Encounter: Payer: Self-pay | Admitting: Internal Medicine

## 2023-11-05 VITALS — BP 124/76 | HR 77 | Ht 66.0 in | Wt 313.0 lb

## 2023-11-05 DIAGNOSIS — E1142 Type 2 diabetes mellitus with diabetic polyneuropathy: Secondary | ICD-10-CM

## 2023-11-05 DIAGNOSIS — Z794 Long term (current) use of insulin: Secondary | ICD-10-CM

## 2023-11-05 DIAGNOSIS — E1165 Type 2 diabetes mellitus with hyperglycemia: Secondary | ICD-10-CM

## 2023-11-05 DIAGNOSIS — E1169 Type 2 diabetes mellitus with other specified complication: Secondary | ICD-10-CM

## 2023-11-05 LAB — POCT GLYCOSYLATED HEMOGLOBIN (HGB A1C): Hemoglobin A1C: 9.9 % — AB (ref 4.0–5.6)

## 2023-11-05 MED ORDER — INSULIN PEN NEEDLE 31G X 5 MM MISC: 1.0000 | Freq: Four times a day (QID) | 3 refills | Status: AC

## 2023-11-05 MED ORDER — LANTUS SOLOSTAR 100 UNIT/ML ~~LOC~~ SOPN: 90.0000 [IU] | PEN_INJECTOR | Freq: Every day | SUBCUTANEOUS | 5 refills | Status: AC

## 2023-11-05 MED ORDER — DAPAGLIFLOZIN PROPANEDIOL 10 MG PO TABS: 10.0000 mg | ORAL_TABLET | Freq: Every day | ORAL | 3 refills | Status: AC

## 2023-11-05 MED ORDER — DEXCOM G7 SENSOR MISC: 1.0000 | 3 refills | Status: AC

## 2023-11-05 NOTE — Telephone Encounter (Signed)
Tara Keller needs PA
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
## 2023-11-05 NOTE — Telephone Encounter (Signed)
 Pharmacy Patient Advocate Encounter   Received notification from Pt Calls Messages that prior authorization for Farxiga  10mg  is required/requested.   Insurance verification completed.   The patient is insured through [facility] MEDICAID.   Per test claim: PA required; PA submitted to above mentioned insurance via Latent Key/confirmation #/EOC [id number] Status is pending

## 2023-11-05 NOTE — Patient Instructions (Addendum)
-   Continue Metformin  750 mg to 1 tablet TWICE a day  - Take Farxiga  10 mg daily  - Increase  Lantus   90 units ONCE a day  - Take  Novolog  18 units with each meal   - Novolog  correctional insulin : ADD extra units on insulin  to your meal-time Novolog  dose if your blood sugars are higher than 160. Use the scale below to help guide you:   Blood sugar before meal Number of units to inject  Less than 160 0 unit  161 - 190 1 units  191 - 220 2 units  221 - 250 3 units  251 - 280 4 units  281 - 310 5 units  311 - 340 6 units  341 - 370 7 units  371 - 400 8 units      HOW TO TREAT LOW BLOOD SUGARS (Blood sugar LESS THAN 70 MG/DL) Please follow the RULE OF 15 for the treatment of hypoglycemia treatment (when your (blood sugars are less than 70 mg/dL)   STEP 1: Take 15 grams of carbohydrates when your blood sugar is low, which includes:  3-4 GLUCOSE TABS  OR 3-4 OZ OF JUICE OR REGULAR SODA OR ONE TUBE OF GLUCOSE GEL    STEP 2: RECHECK blood sugar in 15 MINUTES STEP 3: If your blood sugar is still low at the 15 minute recheck --> then, go back to STEP 1 and treat AGAIN with another 15 grams of carbohydrates.

## 2023-11-06 ENCOUNTER — Encounter: Payer: Self-pay | Admitting: Internal Medicine

## 2023-11-07 NOTE — Telephone Encounter (Signed)
 Pharmacy Patient Advocate Encounter  Received notification from [facility] MEDICAID that Prior Authorization for  Farxiga  10mg   has been APPROVED from 11-05-2023 to 11-04-2024   PA #/Case ID/Reference #: [id number]

## 2023-11-08 ENCOUNTER — Ambulatory Visit: Payer: MEDICAID | Attending: Family Medicine | Admitting: Audiologist

## 2023-11-08 DIAGNOSIS — H903 Sensorineural hearing loss, bilateral: Secondary | ICD-10-CM | POA: Diagnosis present

## 2023-11-08 NOTE — Procedures (Signed)
  [facility] [address] [place], [place]  [address] [phone]  AUDIOLOGICAL  EVALUATION  NAME: Tara Keller     DOB:   10/27/1965      MRN: [id number]                                                                                     DATE: 11/08/2023     REFERENT: Tanda Bleacher, MD STATUS: Outpatient DIAGNOSIS: Sensorineural Hearing Loss    History: [name] was seen for an audiological evaluation due to difficulty hearing her whole life. [name] had a bug in her right ear as a child and has struggled to hear from this ear ever since. She has not had a hearing test in many years.  [name] denies pain, pressure, or tinnitus.  [name] has no history of hazardous noise exposure.  Medical history shows diabetes which is a risk for hearing loss.    Evaluation:  Otoscopy showed a clear view of the tympanic membranes, bilaterally Tympanometry results were consistent with normal middle ear function, bilaterally   Audiometric testing was completed using Conventional Audiometry techniques with insert earphones and supraural headphones. Test results are consistent with moderate sloping to severe sensorineural hearing loss bilaterally. [name] would often ask for a beep to be turned up instead of pressing button to indicate she heard it. Speech Recognition Thresholds were obtained at  50dB HL in the right ear and at 35dB HL in the left ear. Word Recognition Testing was completed at 90dB in the right ear and 80dB in the left ear and [name] scored 100% in eah ear.    Results:  The test results were reviewed with [facility]. [name] appeared to be waiting till she was certain he heard the beep, possible padding bilaterally due to PTA higher than SRT. [name] will need to be tested again when fit for hearing aids to confirm hearing thresholds.  Audiogram printed and provided to [facility].      Recommendations: Hearing aids recommended for both ears. Patient given list of  local hearing aid providers. For Medicaid aids she will need to go to [facility] in [place]. Due to poor reliability recommend referral for hearing evaluation at time of hearing aid consult.   Tanda Bleacher, MD please contact [facility] for Medicaid hearing aid referral ( Telephone: [phone]   Fax: [phone] )  35 minutes spent testing and counseling on results.   If you have any questions please feel free to contact me at [phone].  Lauraine Ka Stalnaker Au.D.  Audiologist   11/08/2023  3:35 PM  Cc: Tanda Bleacher, MD

## 2023-11-12 ENCOUNTER — Ambulatory Visit (INDEPENDENT_AMBULATORY_CARE_PROVIDER_SITE_OTHER): Payer: MEDICAID | Admitting: Family Medicine

## 2023-11-12 ENCOUNTER — Other Ambulatory Visit (INDEPENDENT_AMBULATORY_CARE_PROVIDER_SITE_OTHER): Payer: Self-pay | Admitting: Family Medicine

## 2023-11-12 VITALS — BP 118/75 | HR 88 | Ht 66.0 in | Wt 314.0 lb

## 2023-11-12 DIAGNOSIS — I10 Essential (primary) hypertension: Secondary | ICD-10-CM | POA: Diagnosis not present

## 2023-11-12 DIAGNOSIS — Z6841 Body Mass Index (BMI) 40.0 and over, adult: Secondary | ICD-10-CM | POA: Diagnosis not present

## 2023-11-12 DIAGNOSIS — E1165 Type 2 diabetes mellitus with hyperglycemia: Secondary | ICD-10-CM

## 2023-11-12 DIAGNOSIS — F329 Major depressive disorder, single episode, unspecified: Secondary | ICD-10-CM | POA: Diagnosis not present

## 2023-11-12 DIAGNOSIS — N393 Stress incontinence (female) (male): Secondary | ICD-10-CM

## 2023-11-12 DIAGNOSIS — F321 Major depressive disorder, single episode, moderate: Secondary | ICD-10-CM

## 2023-11-12 DIAGNOSIS — Z794 Long term (current) use of insulin: Secondary | ICD-10-CM

## 2023-11-13 MED ORDER — PHENTERMINE HCL 37.5 MG PO TABS: 37.5000 mg | ORAL_TABLET | Freq: Every day | ORAL | 0 refills | Status: DC
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
## 2023-11-20 ENCOUNTER — Other Ambulatory Visit: Payer: Self-pay

## 2023-11-20 ENCOUNTER — Telehealth: Payer: Self-pay | Admitting: Family Medicine

## 2023-11-20 DIAGNOSIS — H9193 Unspecified hearing loss, bilateral: Secondary | ICD-10-CM

## 2023-11-20 NOTE — Telephone Encounter (Signed)
 Copied from CRM [id number]. Topic: Referral - Request for Referral >> Nov 20, 2023 10:09 AM Shanda MATSU wrote: Did the patient discuss referral with their provider in the last year? Yes (If No - schedule appointment) (If Yes - send message)  Appointment offered? No  Type of order/referral and detailed reason for visit: Audiologist with [facility]  Preference of office, provider, location: [address] [place], [place] [address], phone: [phone] fax: [phone]  If referral order, have you been seen by this specialty before? Yes (If Yes, this issue or another issue? When? Where?  Can we respond through MyChart? Yes

## 2023-11-21 NOTE — Telephone Encounter (Signed)
 I have faxed referral to [facility]

## 2023-11-25 ENCOUNTER — Ambulatory Visit (HOSPITAL_COMMUNITY)
Admission: RE | Admit: 2023-11-25 | Discharge: 2023-11-25 | Disposition: A | Discharge status: Home / Self Care | Payer: MEDICAID | Source: Ambulatory Visit | Attending: Emergency Medicine | Admitting: Emergency Medicine

## 2023-11-25 ENCOUNTER — Other Ambulatory Visit: Payer: Self-pay

## 2023-11-25 ENCOUNTER — Ambulatory Visit (INDEPENDENT_AMBULATORY_CARE_PROVIDER_SITE_OTHER): Payer: MEDICAID

## 2023-11-25 ENCOUNTER — Encounter (HOSPITAL_COMMUNITY): Payer: Self-pay

## 2023-11-25 VITALS — BP 133/82 | HR 77 | Temp 97.0°F | Resp 20

## 2023-11-25 DIAGNOSIS — R051 Acute cough: Secondary | ICD-10-CM | POA: Diagnosis not present

## 2023-11-25 DIAGNOSIS — B9689 Other specified bacterial agents as the cause of diseases classified elsewhere: Secondary | ICD-10-CM | POA: Diagnosis not present

## 2023-11-25 DIAGNOSIS — J019 Acute sinusitis, unspecified: Secondary | ICD-10-CM

## 2023-11-25 MED ORDER — ALBUTEROL SULFATE (2.5 MG/3ML) 0.083% IN NEBU
2.5000 mg | INHALATION_SOLUTION | Freq: Once | RESPIRATORY_TRACT | Status: AC
  Administered 2023-11-25: 2.5 mg via RESPIRATORY_TRACT

## 2023-11-25 MED ORDER — DOXYCYCLINE HYCLATE 100 MG PO CAPS: 100.0000 mg | ORAL_CAPSULE | Freq: Two times a day (BID) | ORAL | 0 refills | Status: AC

## 2023-11-25 MED ORDER — PROMETHAZINE-DM 6.25-15 MG/5ML PO SYRP: 5.0000 mL | ORAL_SOLUTION | Freq: Every evening | ORAL | 0 refills | Status: AC | PRN

## 2023-11-25 MED ORDER — ALBUTEROL SULFATE (2.5 MG/3ML) 0.083% IN NEBU
INHALATION_SOLUTION | RESPIRATORY_TRACT | Status: AC
  Filled 2023-11-25: qty 3

## 2023-11-25 NOTE — Discharge Instructions (Addendum)
 Xray does not show infection However with the duration of your symptoms, I am treating you with an antibiotic. Please take medication doxycycline  as prescribed. Take with food to avoid upset stomach. Finish the full course - you should not have any leftover!  The promethazine  DM cough syrup can be used once before bed. Be cautious as this might make you drowsy.  Please go to the emergency department if symptoms worsen.
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
## 2023-11-25 NOTE — ED Triage Notes (Signed)
 Patient complains of pain in back, mid to lower back.  Patient complains of mucus in chest sitting on the walls.  Phlegm is white.  Patient has runny nose, denies fever.   Pain in back is worse with coughing.  Took back and body, tylenol , ibuprofen , sinus medicine.    Patient reports symptoms for 2 weeks

## 2023-11-27 ENCOUNTER — Encounter (INDEPENDENT_AMBULATORY_CARE_PROVIDER_SITE_OTHER): Payer: Self-pay | Admitting: Family Medicine

## 2023-11-27 ENCOUNTER — Ambulatory Visit (INDEPENDENT_AMBULATORY_CARE_PROVIDER_SITE_OTHER): Payer: MEDICAID | Admitting: Family Medicine

## 2023-11-27 VITALS — BP 124/72 | HR 95 | Temp 97.9°F | Ht 66.0 in | Wt 307.0 lb

## 2023-11-27 DIAGNOSIS — E785 Hyperlipidemia, unspecified: Secondary | ICD-10-CM

## 2023-11-27 DIAGNOSIS — E559 Vitamin D deficiency, unspecified: Secondary | ICD-10-CM

## 2023-11-27 DIAGNOSIS — E669 Obesity, unspecified: Secondary | ICD-10-CM

## 2023-11-27 DIAGNOSIS — E1169 Type 2 diabetes mellitus with other specified complication: Secondary | ICD-10-CM | POA: Diagnosis not present

## 2023-11-27 DIAGNOSIS — F321 Major depressive disorder, single episode, moderate: Secondary | ICD-10-CM

## 2023-11-27 DIAGNOSIS — Z6841 Body Mass Index (BMI) 40.0 and over, adult: Secondary | ICD-10-CM

## 2023-11-27 DIAGNOSIS — E1165 Type 2 diabetes mellitus with hyperglycemia: Secondary | ICD-10-CM

## 2023-11-27 DIAGNOSIS — Z794 Long term (current) use of insulin: Secondary | ICD-10-CM

## 2023-11-27 MED ORDER — TOPIRAMATE 50 MG PO TABS: 50.0000 mg | ORAL_TABLET | Freq: Every day | ORAL | 0 refills | Status: DC

## 2023-11-27 MED ORDER — ATORVASTATIN CALCIUM 40 MG PO TABS: 40.0000 mg | ORAL_TABLET | Freq: Every day | ORAL | 3 refills | Status: DC

## 2023-11-27 MED ORDER — VITAMIN D (ERGOCALCIFEROL) 1.25 MG (50000 UNIT) PO CAPS: 50000.0000 [IU] | ORAL_CAPSULE | ORAL | 0 refills | Status: DC

## 2023-11-27 NOTE — Assessment & Plan Note (Addendum)
 30 day average 202, GMI 8.1%, 38% in range, 42% high, 18% very high 14 day average 197, GMI 8.0%,  43% in range, 41% high, 16% very high 7 day average 177, 58% in range, 36% high, 6% very high 3 day average 182, 56% in range, 36% high, 8% very high

## 2023-11-27 NOTE — Progress Notes (Signed)
 SUBJECTIVE:  Chief Complaint: Obesity  Interim History: Patient has been working on changing her activity level and exercises to align more with what her body is able to tolerate.  She has changed her food intake to be more nutritious.  She is working on choosing options that tend to be healthier.  For Thanksgiving she wants to relax but won't be traveling. For December she is hoping to go home to visit family.  She may be going to a local event for Thanksgiving.   Tara Keller is here to discuss her progress with her obesity treatment plan. She is on the practicing portion control and making smarter food choices, such as increasing vegetables and decreasing simple carbohydrates and states she is following her eating plan approximately 100 % of the time. She states she is exercising 10 minutes 7 times per week.   OBJECTIVE: Visit Diagnoses: Problem List Items Addressed This Visit       Endocrine   Hyperlipidemia associated with type 2 diabetes mellitus (HCC)   Relevant Medications   atorvastatin  (LIPITOR) 40 MG tablet     Other   Episode of moderate major depression (HCC)   Relevant Medications   topiramate  (TOPAMAX ) 50 MG tablet   Vitamin D  deficiency   Relevant Medications   Vitamin D , Ergocalciferol , (DRISDOL ) 1.25 MG (50000 UNIT) CAPS capsule    Vitals Temp: 97.9 F (36.6 C) BP: 124/72 Pulse Rate: 95 SpO2: 100 %   Anthropometric Measurements Height: 5' 6 (1.676 m) Weight: (!) 307 lb (139.3 kg) BMI (Calculated): 49.57 Weight at Last Visit: 307 lb Weight Lost Since Last Visit: 0 Weight Gained Since Last Visit: 0 Starting Weight: 311 lb Total Weight Loss (lbs): 4 lb (1.814 kg)   Body Composition  Body Fat %: 54.8 % Fat Mass (lbs): 168.2 lbs Muscle Mass (lbs): 131.8 lbs Total Body Water  (lbs): 99 lbs Visceral Fat Rating : 21   Other Clinical Data Today's Visit #: 13 Starting Date: 11/22/22 Comments: [facility]     ASSESSMENT AND PLAN: Assessment &  Plan Vitamin D  deficiency Tolerating prescription strength vitamin D  and needs refill today.  No nausea, vomiting, or muscle weakness reported. Episode of moderate major depression (HCC) Patient is on topiramate  daily to aid in depression as well as emotional eating that she is experiencing associated with depression no suicidal or homicidal ideation mention.  Improving symptoms on current treatment. Hyperlipidemia associated with type 2 diabetes mellitus (HCC) Needs refill of atorvastatin  today.  No change in dosage.  No side effects mention.  Will need repeat labs in January 2026. Type 2 diabetes mellitus with hyperglycemia, with long-term current use of insulin  (HCC) 30 day average 202, GMI 8.1%, 38% in range, 42% high, 18% very high 14 day average 197, GMI 8.0%,  43% in range, 41% high, 16% very high 7 day average 177, 58% in range, 36% high, 6% very high 3 day average 182, 56% in range, 36% high, 8% very high BMI 45.0-49.9, adult (HCC)  Obesity with starting BMI of 50.3  Patient is on Farxiga , metformin , and insulin  for management of her type 2 diabetes.  Blood sugar is not well enough controlled at this current time to be able to titrate down insulin .  Follow-up on CGM at next appointment. Diet: Haruka is currently in the action stage of change. As such, her goal is to continue with weight loss efforts and has agreed to practicing portion control and making smarter food choices, such as increasing vegetables and decreasing simple carbohydrates.  Exercise:  For substantial health benefits, adults should do at least 150 minutes (2 hours and 30 minutes) a week of moderate-intensity, or 75 minutes (1 hour and 15 minutes) a week of vigorous-intensity aerobic physical activity, or an equivalent combination of moderate- and vigorous-intensity aerobic activity. Aerobic activity should be performed in episodes of at least 10 minutes, and preferably, it should be spread throughout the  week.  Behavior Modification:  We discussed the following Behavioral Modification Strategies today: increasing lean protein intake, decreasing simple carbohydrates, increasing vegetables, meal planning and cooking strategies, and planning for success.   Return in about 4 weeks (around 12/25/2023).   She was informed of the importance of frequent follow up visits to maximize her success with intensive lifestyle modifications for her multiple health conditions.  Attestation Statements:   Reviewed by clinician on day of visit: allergies, medications, problem list, medical history, surgical history, family history, social history, and previous encounter notes.     Adelita Cho, MD

## 2023-12-06 NOTE — Assessment & Plan Note (Signed)
 Patient is on topiramate  daily to aid in depression as well as emotional eating that she is experiencing associated with depression no suicidal or homicidal ideation mention.  Improving symptoms on current treatment.

## 2023-12-06 NOTE — Assessment & Plan Note (Signed)
 Tolerating prescription strength vitamin D  and needs refill today.  No nausea, vomiting, or muscle weakness reported.

## 2023-12-06 NOTE — Assessment & Plan Note (Signed)
 Needs refill of atorvastatin  today.  No change in dosage.  No side effects mention.  Will need repeat labs in January 2026.

## 2023-12-10 ENCOUNTER — Encounter: Payer: Self-pay | Admitting: Adult Health

## 2023-12-10 ENCOUNTER — Ambulatory Visit: Payer: MEDICAID | Admitting: Adult Health

## 2023-12-10 VITALS — BP 132/89 | HR 108 | Ht 66.0 in | Wt 306.0 lb

## 2023-12-10 DIAGNOSIS — G4733 Obstructive sleep apnea (adult) (pediatric): Secondary | ICD-10-CM | POA: Diagnosis not present
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
## 2023-12-11 NOTE — Progress Notes (Signed)
 Community message has been sent to [facility] for supplies on 12/11/23. DD

## 2023-12-14 ENCOUNTER — Ambulatory Visit (HOSPITAL_COMMUNITY)
Admission: RE | Admit: 2023-12-14 | Discharge: 2023-12-14 | Disposition: A | Discharge status: Home / Self Care | Payer: MEDICAID | Source: Ambulatory Visit | Attending: Nurse Practitioner

## 2023-12-14 ENCOUNTER — Encounter (HOSPITAL_COMMUNITY): Payer: Self-pay

## 2023-12-14 VITALS — BP 120/72 | HR 107 | Temp 98.0°F | Resp 18

## 2023-12-14 DIAGNOSIS — J209 Acute bronchitis, unspecified: Secondary | ICD-10-CM

## 2023-12-14 MED ORDER — MUCINEX DM MAXIMUM STRENGTH 60-1200 MG PO TB12: 1.0000 | ORAL_TABLET | Freq: Two times a day (BID) | ORAL | 0 refills | Status: DC

## 2023-12-14 MED ORDER — BENZONATATE 200 MG PO CAPS: 200.0000 mg | ORAL_CAPSULE | Freq: Three times a day (TID) | ORAL | 0 refills | Status: DC | PRN

## 2023-12-14 NOTE — ED Triage Notes (Signed)
 Patient reports that she was seen on 11/25/23 and states she was prescribed antibiotics and cough medicine which she has been taking. Patient states her cough has worsened in the past 2 days and feels mucus moving up and down in her chest. Patient states she is coughing constantly now. Sputum is light yellow in color.
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
## 2023-12-14 NOTE — Discharge Instructions (Addendum)
 You were seen today for a persistent cough related to acute bronchitis with airway irritation and mild bronchospasm. This is most often caused by a viral infection and can linger for several weeks as the airways recover, even after antibiotics are finished. Your chest x-ray was normal at your last visit, and your lung exam today was normal despite your consistent cough.  Take the prescribed Mucinex -DM twice daily to help loosen and thin chest mucus and Tessalon  Perles as needed to calm coughing spells. Use a humidifier in your room or breathe in steam from a warm shower to help break up congestion. Drink plenty of fluids throughout the day to keep mucus thin and easier to cough up. Use your albuterol  inhaler for coughing fits or chest tightness even if you are not wheezing. Try sleeping slightly elevated and avoid smoke exposure as much as possible, as secondhand smoke can significantly worsen airway irritation and prolong cough.  Follow up with your primary care provider if your cough does not significantly improve over the next one to two weeks, if mucus becomes thick, dark, or foul-smelling, or if you develop new symptoms such as wheezing or increasing fatigue. Go to the emergency department immediately if you develop trouble breathing, chest pain, new or worsening wheezing that does not improve with inhaler use, coughing up blood, persistent high fever, bluish lips or fingertips, extreme weakness, dizziness, or inability to catch your breath.

## 2023-12-17 ENCOUNTER — Encounter: Payer: MEDICAID | Admitting: Dietician

## 2023-12-25 ENCOUNTER — Ambulatory Visit (INDEPENDENT_AMBULATORY_CARE_PROVIDER_SITE_OTHER): Payer: MEDICAID | Admitting: Family Medicine

## 2023-12-25 ENCOUNTER — Encounter (INDEPENDENT_AMBULATORY_CARE_PROVIDER_SITE_OTHER): Payer: Self-pay | Admitting: Family Medicine

## 2023-12-25 VITALS — BP 115/71 | HR 98 | Temp 97.6°F | Ht 66.0 in | Wt 302.0 lb

## 2023-12-25 DIAGNOSIS — Z6841 Body Mass Index (BMI) 40.0 and over, adult: Secondary | ICD-10-CM

## 2023-12-25 DIAGNOSIS — Z7984 Long term (current) use of oral hypoglycemic drugs: Secondary | ICD-10-CM

## 2023-12-25 DIAGNOSIS — E1165 Type 2 diabetes mellitus with hyperglycemia: Secondary | ICD-10-CM

## 2023-12-25 DIAGNOSIS — Z794 Long term (current) use of insulin: Secondary | ICD-10-CM

## 2023-12-25 NOTE — Assessment & Plan Note (Addendum)
 She has been monitoring her blood sugar and has had slightly lower blood sugars over the last few weeks.  She isn't eating much in terms of simple carbs.  She is focusing on increasing her protein intake.  She has been drinking a protein shake daily.  She is waiting for a refill of her continuous glucose monitor sensors- getting replacements for the 2 that didn't work.  Will follow-up on blood sugar monitoring at next appointment.

## 2023-12-25 NOTE — Progress Notes (Signed)
 "  SUBJECTIVE:  Chief Complaint: Obesity  Interim History: Patient has stay focused on protein, protein shakes, vegetables.  She has a bunch of trays and can cook a bunch of food and portion it out for the week so she can just heat it up.  She got a new exercise equipment piece.  She got a dog and is walking more. She did not eat much over Thanksgiving.  She is going back to her grandkids house for Christmas.  She is ready for the holiday.    Tara Keller is here to discuss her progress with her obesity treatment plan. She is on the practicing portion control and making smarter food choices, such as increasing vegetables and decreasing simple carbohydrates and states she is following her eating plan approximately 90 % of the time. She states she is exercising 20 minutes 3 times per week.   OBJECTIVE: Visit Diagnoses: Problem List Items Addressed This Visit       Endocrine   Type 2 diabetes mellitus with hyperglycemia, with long-term current use of insulin  (HCC) - Primary     Other   Morbid obesity (HCC)   Other Visit Diagnoses       BMI 45.0-49.9, adult (HCC)           Vitals Temp: 97.6 F (36.4 C) BP: 115/71 Pulse Rate: 98 SpO2: 98 %   Anthropometric Measurements Height: 5' 6 (1.676 m) Weight: (!) 302 lb (137 kg) BMI (Calculated): 48.77 Weight at Last Visit: 307 lb Weight Lost Since Last Visit: 5 Weight Gained Since Last Visit: 0 Starting Weight: 311 lb Total Weight Loss (lbs): 9 lb (4.082 kg)   Body Composition  Body Fat %: 54.4 % Fat Mass (lbs): 164.8 lbs Muscle Mass (lbs): 131 lbs Total Body Water  (lbs): 99.6 lbs Visceral Fat Rating : 21   Other Clinical Data Today's Visit #: 14 Starting Date: 11/22/22 Comments: [facility]     ASSESSMENT AND PLAN: Assessment & Plan Type 2 diabetes mellitus with hyperglycemia, with long-term current use of insulin  (HCC) She has been monitoring her blood sugar and has had slightly lower blood sugars over the last few weeks.   She isn't eating much in terms of simple carbs.  She is focusing on increasing her protein intake.  She has been drinking a protein shake daily.  She is waiting for a refill of her continuous glucose monitor sensors- getting replacements for the 2 that didn't work.  Will follow-up on blood sugar monitoring at next appointment. BMI 45.0-49.9, adult (HCC)  Obesity with starting BMI of 50.3    Diet: Amberlyn is currently in the action stage of change. As such, her goal is to continue with weight loss efforts and has agreed to practicing portion control and making smarter food choices, such as increasing vegetables and decreasing simple carbohydrates.   Exercise:  All adults should avoid inactivity. Some activity is better than none, and adults who participate in any amount of physical activity, gain some health benefits.  Behavior Modification:  We discussed the following Behavioral Modification Strategies today: increasing lean protein intake, decreasing simple carbohydrates, increasing vegetables, holiday eating strategies, and planning for success.   Return in about 4 weeks (around 01/22/2024).   She was informed of the importance of frequent follow up visits to maximize her success with intensive lifestyle modifications for her multiple health conditions.  Attestation Statements:   Reviewed by clinician on day of visit: allergies, medications, problem list, medical history, surgical history, family history, social history, and  previous encounter notes. I personally spent a total of 25 minutes in the care of the patient today including preparing to see the patient, getting/reviewing separately obtained history, counseling and educating, and independently interpreting results.   Adelita Cho, MD "

## 2024-01-04 ENCOUNTER — Ambulatory Visit: Payer: Self-pay

## 2024-01-04 ENCOUNTER — Ambulatory Visit: Payer: MEDICAID | Admitting: Family Medicine

## 2024-01-04 NOTE — Telephone Encounter (Signed)
 Pt advised that no appts are available until next month. Pt offered UC services, she declined. She wants to see if Dr. Tanda can squeeze her in sooner.   FYI Only or Action Required?: Action required by provider: request for appointment. Pt missed appt today and would like to be seen ASAP for thigh pain.   Patient was last seen in primary care on 12/25/2023 by [place] Adelita PENNER, MD.  Called Nurse Triage reporting Leg Pain.  Symptoms began several days ago.  Interventions attempted: Prescription medications: muscle relaxer.  Symptoms are: gradually worsening.  Triage Disposition: Home Care  Patient/caregiver understands and will follow disposition?: No, wishes to speak with PCP   Copied from CRM #[id number]. Topic: Clinical - Red Word Triage >> Jan 04, 2024 12:08 PM Gustabo D wrote: Pt is having trouble with her right leg says it's hurting really bad and like something is pulling. Started on Dec 22,2025 and got worse. Reason for Disposition  Leg pain  Answer Assessment - Initial Assessment Questions 1. ONSET: When did the pain start?      Dec 22nd  2. LOCATION: Where is the pain located?      R leg, thigh specifically  3. PAIN: How bad is the pain?    (Scale 1-10; or mild, moderate, severe)     Pulling pain when she puts weight on it, 10/10   5. CAUSE: What do you think is causing the leg pain?     Arthritis per patient  6. OTHER SYMPTOMS: Do you have any other symptoms? (e.g., chest pain, back pain, breathing difficulty, swelling, rash, fever, numbness, weakness)     Denies  Hx arthritis, using cane Missed appt today, got the time wrong  Protocols used: Leg Pain-A-AH

## 2024-01-05 ENCOUNTER — Ambulatory Visit (HOSPITAL_COMMUNITY)
Admission: RE | Admit: 2024-01-05 | Discharge: 2024-01-05 | Disposition: A | Discharge status: Home / Self Care | Payer: MEDICAID | Source: Ambulatory Visit

## 2024-01-05 ENCOUNTER — Encounter (HOSPITAL_COMMUNITY): Payer: Self-pay

## 2024-01-05 ENCOUNTER — Ambulatory Visit (HOSPITAL_COMMUNITY): Payer: MEDICAID

## 2024-01-05 VITALS — BP 99/71 | HR 86 | Temp 98.0°F | Resp 18

## 2024-01-05 DIAGNOSIS — R112 Nausea with vomiting, unspecified: Secondary | ICD-10-CM | POA: Diagnosis not present

## 2024-01-05 DIAGNOSIS — M79604 Pain in right leg: Secondary | ICD-10-CM | POA: Diagnosis not present

## 2024-01-05 MED ORDER — ONDANSETRON 4 MG PO TBDP
4.0000 mg | ORAL_TABLET | Freq: Once | ORAL | Status: AC
  Administered 2024-01-05: 4 mg via ORAL

## 2024-01-05 MED ORDER — ONDANSETRON 4 MG PO TBDP
ORAL_TABLET | ORAL | Status: AC
  Filled 2024-01-05: qty 1

## 2024-01-05 MED ORDER — ONDANSETRON 4 MG PO TBDP: 4.0000 mg | ORAL_TABLET | Freq: Three times a day (TID) | ORAL | 0 refills | Status: AC | PRN

## 2024-01-05 NOTE — Discharge Instructions (Addendum)
 Your leg pain is likely due to a muscle strain which will improve on its own with time.   You may take tylenol  and/or ibuprofen  as needed for aches and pains.  Continue to take prescribed muscle relaxer as needed for muscle spasm.  Apply heat to the pulled muscle 20 minutes on 20 minutes off as needed, heat relaxes muscles.  Wear Ace wrap for compression support.  Perform gentle exercises and stretches to area of tenderness.  I would like for you to rest, however I do not want you to avoid moving the area. Movement and stretching will help with healing.  You have been given a prescription for ondansetron .  Take 1 tablet every 8 hours as needed for nausea, vomiting.  Red flag symptoms to watch out for are numbness/tingling to the legs, weakness, loss of bowel/bladder control, and/or worsening pain that does not respond well to medicines.  Follow-up with your primary care provider or return to urgent care if your symptoms do not improve in the next 3 to 4 days with medications and interventions recommended today. If your symptoms are severe (red flag), please go to the emergency room.
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
## 2024-01-05 NOTE — ED Triage Notes (Signed)
 Patient is having right leg pain that started on the 22nd.  We patient is sitting down its ok put when she stand up the pain comes back.  Patient went to stand up and has been having pain.  Patient vomit before she left home.  Patient tool aleve , tylenol , and a muscle relaxer and it did not help.

## 2024-01-07 NOTE — Telephone Encounter (Signed)
 Report to Emergency Department/Urgent Care/call 911 for immediate medical evaluation. Follow-up with Primary Care.

## 2024-01-07 NOTE — Telephone Encounter (Signed)
 Disposition recommendations noted.  No sooner appointments available.  Was seen at [facility] 01/05/2024 for same.

## 2024-01-14 ENCOUNTER — Ambulatory Visit: Admitting: Podiatry

## 2024-01-14 DIAGNOSIS — M79674 Pain in right toe(s): Secondary | ICD-10-CM

## 2024-01-14 DIAGNOSIS — E1142 Type 2 diabetes mellitus with diabetic polyneuropathy: Secondary | ICD-10-CM

## 2024-01-14 DIAGNOSIS — Z794 Long term (current) use of insulin: Secondary | ICD-10-CM

## 2024-01-14 DIAGNOSIS — B351 Tinea unguium: Secondary | ICD-10-CM

## 2024-01-14 DIAGNOSIS — M79675 Pain in left toe(s): Secondary | ICD-10-CM

## 2024-01-14 NOTE — Progress Notes (Signed)
Complaint:  Visit Type: Patient returns to my office for continued preventative foot care services. Complaint: Patient states" my nails have grown long and thick and become painful to walk and wear shoes" Patient has been diagnosed with DM with neuropathy. The patient presents for preventative foot care services.  Podiatric Exam: Vascular: dorsalis pedis and posterior tibial pulses are palpable bilateral. Capillary return is immediate. Temperature gradient is WNL. Skin turgor WNL  Sensorium: Diminished  Semmes Weinstein monofilament test. Normal tactile sensation bilaterally. Nail Exam: Pt has thick disfigured discolored nails with subungual debris noted bilateral entire nail hallux through fifth toenails Ulcer Exam: There is no evidence of ulcer or pre-ulcerative changes or infection. Orthopedic Exam: Muscle tone and strength are WNL. No limitations in general ROM. No crepitus or effusions noted. Foot type and digits show no abnormalities. HAV  B/L. Skin: No Porokeratosis. No infection or ulcers  Diagnosis:  Onychomycosis, , Pain in right toe, pain in left toes  Treatment & Plan Procedures and Treatment: Consent by patient was obtained for treatment procedures.   Debridement of mycotic and hypertrophic toenails, 1 through 5 bilateral and clearing of subungual debris. No ulceration, no infection noted.  Return Visit-Office Procedure: Patient instructed to return to the office for a follow up visit 3 months for continued evaluation and treatment.    Isley Weisheit DPM 

## 2024-01-16 ENCOUNTER — Ambulatory Visit (HOSPITAL_COMMUNITY)
Admission: RE | Admit: 2024-01-16 | Discharge: 2024-01-16 | Disposition: A | Discharge status: Home / Self Care | Payer: MEDICAID | Source: Ambulatory Visit | Attending: Nurse Practitioner

## 2024-01-16 ENCOUNTER — Encounter (HOSPITAL_COMMUNITY): Payer: Self-pay

## 2024-01-16 VITALS — BP 129/77 | HR 100 | Temp 97.9°F | Resp 19

## 2024-01-16 DIAGNOSIS — R1013 Epigastric pain: Secondary | ICD-10-CM | POA: Insufficient documentation

## 2024-01-16 DIAGNOSIS — K2900 Acute gastritis without bleeding: Secondary | ICD-10-CM | POA: Diagnosis present

## 2024-01-16 LAB — CBC WITH DIFFERENTIAL/PLATELET
Abs Immature Granulocytes: 0.05 K/uL (ref 0.00–0.07)
Basophils Absolute: 0 K/uL (ref 0.0–0.1)
Basophils Relative: 0 %
Eosinophils Absolute: 0.2 K/uL (ref 0.0–0.5)
Eosinophils Relative: 2 %
HCT: 39.8 % (ref 36.0–46.0)
Hemoglobin: 12.5 g/dL (ref 12.0–15.0)
Immature Granulocytes: 1 %
Lymphocytes Relative: 39 %
Lymphs Abs: 3.5 K/uL (ref 0.7–4.0)
MCH: 27 pg (ref 26.0–34.0)
MCHC: 31.4 g/dL (ref 30.0–36.0)
MCV: 86 fL (ref 80.0–100.0)
Monocytes Absolute: 0.8 K/uL (ref 0.1–1.0)
Monocytes Relative: 9 %
Neutro Abs: 4.5 K/uL (ref 1.7–7.7)
Neutrophils Relative %: 49 %
Platelets: 274 K/uL (ref 150–400)
RBC: 4.63 MIL/uL (ref 3.87–5.11)
RDW: 13.5 % (ref 11.5–15.5)
WBC: 9 K/uL (ref 4.0–10.5)
nRBC: 0 % (ref 0.0–0.2)

## 2024-01-16 LAB — POCT URINE DIPSTICK
Bilirubin, UA: NEGATIVE
Glucose, UA: NEGATIVE mg/dL
Ketones, POC UA: NEGATIVE mg/dL
Leukocytes, UA: NEGATIVE
Nitrite, UA: NEGATIVE
POC PROTEIN,UA: NEGATIVE
Spec Grav, UA: 1.015
Urobilinogen, UA: 0.2 U/dL
pH, UA: 7

## 2024-01-16 LAB — COMPREHENSIVE METABOLIC PANEL WITH GFR
ALT: 34 U/L (ref 0–44)
AST: 20 U/L (ref 15–41)
Albumin: 3.9 g/dL (ref 3.5–5.0)
Alkaline Phosphatase: 200 U/L — ABNORMAL HIGH (ref 38–126)
Anion gap: 12 (ref 5–15)
BUN: 10 mg/dL (ref 6–20)
CO2: 26 mmol/L (ref 22–32)
Calcium: 9.6 mg/dL (ref 8.9–10.3)
Chloride: 98 mmol/L (ref 98–111)
Creatinine, Ser: 0.79 mg/dL (ref 0.44–1.00)
GFR, Estimated: >60 mL/min
Glucose, Bld: 177 mg/dL — ABNORMAL HIGH (ref 70–99)
Potassium: 4.1 mmol/L (ref 3.5–5.1)
Sodium: 136 mmol/L (ref 135–145)
Total Bilirubin: 0.2 mg/dL (ref 0.0–1.2)
Total Protein: 7.7 g/dL (ref 6.5–8.1)

## 2024-01-16 LAB — LIPASE, BLOOD: Lipase: 13 U/L (ref 11–51)

## 2024-01-16 LAB — GLUCOSE, POCT (MANUAL RESULT ENTRY): POC Glucose: 201 mg/dL — AB (ref 70–99)

## 2024-01-16 MED ORDER — ALUM & MAG HYDROXIDE-SIMETH 200-200-20 MG/5ML PO SUSP
ORAL | Status: AC
  Filled 2024-01-16: qty 30

## 2024-01-16 MED ORDER — LIDOCAINE VISCOUS HCL 2 % MT SOLN
OROMUCOSAL | Status: AC
  Filled 2024-01-16: qty 15

## 2024-01-16 MED ORDER — LIDOCAINE VISCOUS HCL 2 % MT SOLN
15.0000 mL | Freq: Once | OROMUCOSAL | Status: AC
  Administered 2024-01-16: 15 mL via OROMUCOSAL

## 2024-01-16 MED ORDER — ALUM & MAG HYDROXIDE-SIMETH 200-200-20 MG/5ML PO SUSP
30.0000 mL | Freq: Once | ORAL | Status: AC
  Administered 2024-01-16: 30 mL via ORAL

## 2024-01-16 NOTE — Discharge Instructions (Addendum)
 We gave you Maalox/Mylanta today which helped take away all of your pain.  The urine test is negative for signs of infection.  Recommended clear liquids for the next couple of days, then slowly introduce low acidic food back into your diet.  Please continue all of your medicines and follow-up with your primary care provider as planned next week to discuss next apps.
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
## 2024-01-16 NOTE — ED Triage Notes (Signed)
 Pt reports upper abd pain for 4 days. Feels like food sitting right there when I eat and make me want to throw up. Reports she did have some n/v earlier. Reports takes medication for stomach but not helping any more.

## 2024-01-17 ENCOUNTER — Ambulatory Visit (HOSPITAL_COMMUNITY): Payer: Self-pay

## 2024-01-17 ENCOUNTER — Encounter: Payer: Self-pay | Admitting: Physical Therapy

## 2024-01-17 ENCOUNTER — Ambulatory Visit: Payer: MEDICAID | Attending: Family Medicine | Admitting: Physical Therapy

## 2024-01-17 ENCOUNTER — Other Ambulatory Visit: Payer: Self-pay

## 2024-01-17 DIAGNOSIS — R279 Unspecified lack of coordination: Secondary | ICD-10-CM | POA: Insufficient documentation

## 2024-01-17 DIAGNOSIS — R269 Unspecified abnormalities of gait and mobility: Secondary | ICD-10-CM | POA: Insufficient documentation

## 2024-01-17 DIAGNOSIS — N393 Stress incontinence (female) (male): Secondary | ICD-10-CM | POA: Diagnosis not present

## 2024-01-17 DIAGNOSIS — M62838 Other muscle spasm: Secondary | ICD-10-CM | POA: Insufficient documentation

## 2024-01-17 DIAGNOSIS — M6281 Muscle weakness (generalized): Secondary | ICD-10-CM | POA: Insufficient documentation

## 2024-01-17 DIAGNOSIS — R293 Abnormal posture: Secondary | ICD-10-CM | POA: Insufficient documentation

## 2024-01-17 DIAGNOSIS — M25551 Pain in right hip: Secondary | ICD-10-CM | POA: Insufficient documentation
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
## 2024-01-17 NOTE — Patient Instructions (Signed)
 Urge Incontinence  Ideal urination frequency is every 2-4 wakeful hours, which equates to 5-8 times within a 24-hour period.   Urge incontinence is leakage that occurs when the bladder muscle contracts, creating a sudden need to go before getting to the bathroom.   Going too often when your bladder isn't actually full can disrupt the body's automatic signals to store and hold urine longer, which will increase urgency/frequency.  In this case, the bladder "is running the show" and strategies can be learned to retrain this pattern.   One should be able to control the first urge to urinate, at around .  The bladder can hold up to a "grande latte," or . To help you gain control, practice the Urge Drill below when urgency strikes.  This drill will help retrain your bladder signals and allow you to store and hold urine longer.  The overall goal is to stretch out your time between voids to reach a more manageable voiding schedule.    Practice your "quick flicks" often throughout the day (each waking hour) even when you don't need feel the urge to go.  This will help strengthen your pelvic floor muscles, making them more effective in controlling leakage.  Urge Drill  When you feel an urge to go, follow these steps to regain control: Stop what you are doing and be still Take one deep breath, directing your air into your abdomen Think an affirming thought, such as "I've got this." Do 5 quick flicks of your pelvic floor Walk with control to the bathroom to void, or delay voiding   Bladder Irritants  Certain foods and beverages can be irritating to the bladder.  Avoiding these irritants may decrease your symptoms of urinary urgency, frequency or bladder pain.  Even reducing your intake can help with your symptoms.  Not everyone is sensitive to all bladder irritants, so you may consider focusing on one irritant at a time, removing or reducing your intake of that irritant for 7-10 days to see if  this change helps your symptoms.  Water intake is also very important.  Below is a list of bladder irritants.  Drinks: alcohol, carbonated beverages, caffeinated beverages such as coffee and tea, drinks with artificial sweeteners, citrus juices, apple juice, tomato juice  Foods: tomatoes and tomato based foods, spicy food, sugar and artificial sweeteners, vinegar, chocolate, raw onion, apples, citrus fruits, pineapple, cranberries, tomatoes, strawberries, plums, peaches, cantaloupe  Other: acidic urine (too concentrated) - see water intake info below  Substitutes you can try that are NOT irritating to the bladder: cooked onion, pears, papayas, sun-brewed decaf teas, watermelons, non-citrus herbal teas, apricots, kava and low-acid instant drinks (Postum).    WATER INTAKE: Remember to drink lots of water (aim for fluid intake of half your body weight with 2/3 of fluids being water).  You may be limiting fluids due to fear of leakage, but this can actually worsen urgency symptoms due to highly concentrated urine.  Water helps balance the pH of your urine so it doesn't become too acidic - acidic urine is a bladder irritant!

## 2024-01-21 ENCOUNTER — Encounter: Payer: Self-pay | Admitting: Family Medicine

## 2024-01-21 ENCOUNTER — Ambulatory Visit: Payer: Self-pay | Admitting: Family Medicine

## 2024-01-21 ENCOUNTER — Ambulatory Visit: Payer: MEDICAID | Admitting: Family Medicine

## 2024-01-21 VITALS — BP 121/78 | HR 90 | Ht 66.0 in | Wt 305.8 lb

## 2024-01-21 DIAGNOSIS — E785 Hyperlipidemia, unspecified: Secondary | ICD-10-CM

## 2024-01-21 DIAGNOSIS — E1165 Type 2 diabetes mellitus with hyperglycemia: Secondary | ICD-10-CM

## 2024-01-21 DIAGNOSIS — I10 Essential (primary) hypertension: Secondary | ICD-10-CM | POA: Diagnosis not present

## 2024-01-21 DIAGNOSIS — E66813 Obesity, class 3: Secondary | ICD-10-CM | POA: Diagnosis not present

## 2024-01-21 DIAGNOSIS — E1169 Type 2 diabetes mellitus with other specified complication: Secondary | ICD-10-CM | POA: Diagnosis not present

## 2024-01-21 DIAGNOSIS — Z794 Long term (current) use of insulin: Secondary | ICD-10-CM

## 2024-01-21 DIAGNOSIS — Z6841 Body Mass Index (BMI) 40.0 and over, adult: Secondary | ICD-10-CM

## 2024-01-21 LAB — POCT GLYCOSYLATED HEMOGLOBIN (HGB A1C): HbA1c, POC (controlled diabetic range): 9.4 % — AB (ref 0.0–7.0)

## 2024-01-21 MED ORDER — PHENTERMINE HCL 37.5 MG PO TABS: 37.5000 mg | ORAL_TABLET | Freq: Every day | ORAL | 0 refills | Status: AC
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
## 2024-01-22 ENCOUNTER — Ambulatory Visit: Payer: MEDICAID | Admitting: Physical Therapy

## 2024-01-22 ENCOUNTER — Encounter: Payer: Self-pay | Admitting: Family Medicine

## 2024-01-28 ENCOUNTER — Inpatient Hospital Stay: Payer: MEDICAID | Attending: Psychiatry | Admitting: Psychiatry

## 2024-01-28 ENCOUNTER — Encounter: Payer: Self-pay | Admitting: Psychiatry

## 2024-01-28 ENCOUNTER — Ambulatory Visit: Payer: MEDICAID | Admitting: Physical Therapy

## 2024-01-28 VITALS — BP 116/69 | HR 83 | Temp 97.8°F | Resp 20 | Wt 306.6 lb

## 2024-01-28 DIAGNOSIS — N393 Stress incontinence (female) (male): Secondary | ICD-10-CM | POA: Diagnosis not present

## 2024-01-28 DIAGNOSIS — Z9079 Acquired absence of other genital organ(s): Secondary | ICD-10-CM | POA: Insufficient documentation

## 2024-01-28 DIAGNOSIS — Z8542 Personal history of malignant neoplasm of other parts of uterus: Secondary | ICD-10-CM | POA: Insufficient documentation

## 2024-01-28 DIAGNOSIS — Z90710 Acquired absence of both cervix and uterus: Secondary | ICD-10-CM | POA: Insufficient documentation

## 2024-01-28 DIAGNOSIS — M62838 Other muscle spasm: Secondary | ICD-10-CM

## 2024-01-28 DIAGNOSIS — M6281 Muscle weakness (generalized): Secondary | ICD-10-CM

## 2024-01-28 DIAGNOSIS — Z90722 Acquired absence of ovaries, bilateral: Secondary | ICD-10-CM | POA: Insufficient documentation

## 2024-01-28 DIAGNOSIS — R293 Abnormal posture: Secondary | ICD-10-CM

## 2024-01-28 DIAGNOSIS — C541 Malignant neoplasm of endometrium: Secondary | ICD-10-CM
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
## 2024-01-28 NOTE — Patient Instructions (Signed)
 It was a pleasure to see you in clinic today. - Normal exam today - Return visit planned for 1 year.   Thank you very much for allowing me to provide care for you today.  I appreciate your confidence in choosing our Gynecologic Oncology team at [facility].  If you have any questions about your visit today please call our office or send us  a MyChart message and we will get back to you as soon as possible.

## 2024-01-30 ENCOUNTER — Ambulatory Visit: Payer: MEDICAID | Admitting: Physical Therapy

## 2024-01-31 ENCOUNTER — Encounter: Payer: MEDICAID | Attending: Internal Medicine | Admitting: Dietician

## 2024-01-31 ENCOUNTER — Encounter: Payer: MEDICAID | Admitting: Dietician

## 2024-01-31 DIAGNOSIS — E1169 Type 2 diabetes mellitus with other specified complication: Secondary | ICD-10-CM | POA: Insufficient documentation

## 2024-01-31 DIAGNOSIS — Z794 Long term (current) use of insulin: Secondary | ICD-10-CM | POA: Diagnosis present

## 2024-01-31 NOTE — Patient Instructions (Addendum)
 Call mental health to make an appointment.  Half your plate non starchy vegetables, 1/4 plate lean protein Mostly foods that are not processed. Keep up the good work!  See if a [facility] can fill your Farxiga . Continue to take your medications as prescribed.  [facility] works with [employer] - open Saturday morning 8-12. [address]

## 2024-01-31 NOTE — Progress Notes (Unsigned)
 308  Patient is here today alone.  She was last seen by this RD in 2022.  She would like to learn more about label reading and would like to know what she can and cannot eat.  Acid reflux - what spices.  Should I drink coffee? She is working on changing her diet. Average Dexcom is reflective of an A1C of 7.5% which is improved. Exercises before bed - chair yoga 5 days per week for 20-25 minutes.  She stopped smoking 3 years ago.  History includes:  Type 2 Diabetes (2004),  endometrial cancer s/p chemo and hysterectomy.  - now in remission, HOH in both ears and is working on hearing aids,  OSA and now using c-pap, HTN, HLD, neuropathy, vitamin d  deficiency, pancreatitis, fatty liver, GERD.   Meds include:  Phentramine, Farxia (always out of stock at the pharmacy so has not taken this for 2 years), Metformin , Lantus  90 units q HS, Novolog  18 units before meals (plus sliding scale BG 135/25), vitamin D   Labs noted to include:  A1C 9.4% 01/21/2024 decreased from 9.9% 11/05/2023 CGM:  Dexcom G7 with reader. Sensor reading 147 now.   CGM Results from download: 01/31/2024  % Time CGM active:   *** %   (Goal >70%)  Average glucose:   165 mg/dL for 14 days  Glucose management indicator:   *** %  Time in range (70-180 mg/dL):   68 %   (Goal >29%)  Time High (181-250 mg/dL):   25 %   (Goal < 74%)  Time Very High (>250 mg/dL):    6 %   (Goal < 5%)  Time Low (54-69 mg/dL):   1 %   (Goal <5%)  Time Very Low (<54 mg/dL):   0 %   (Goal <8%)  %CV (glucose variability)    *** %  (Goal <36%)   66 308 lbs 01/31/2023 281 lbs 05/20/2020  She lives with her fiance.  She does her own cooking to be more healthy.  She has lost her grandson, mother, and sister since 2022.  Fiance has CLL. She has quit smoking.   Drug use and food insecurity issues >10 years ago. She goes to a cooking class once per week at the [facility].  She makes smoothies and ice cream using frozen fruit. She is using the [employer] app at the  grocery store.

## 2024-02-04 ENCOUNTER — Ambulatory Visit (INDEPENDENT_AMBULATORY_CARE_PROVIDER_SITE_OTHER): Payer: MEDICAID | Admitting: Family Medicine

## 2024-02-05 ENCOUNTER — Ambulatory Visit: Payer: MEDICAID | Admitting: Internal Medicine
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
## 2024-02-06 ENCOUNTER — Ambulatory Visit: Payer: MEDICAID | Admitting: Physical Therapy

## 2024-02-07 ENCOUNTER — Ambulatory Visit (INDEPENDENT_AMBULATORY_CARE_PROVIDER_SITE_OTHER): Payer: MEDICAID | Admitting: Family Medicine

## 2024-02-07 VITALS — BP 110/69 | HR 90 | Temp 98.0°F | Ht 66.0 in | Wt 306.0 lb

## 2024-02-07 DIAGNOSIS — F321 Major depressive disorder, single episode, moderate: Secondary | ICD-10-CM

## 2024-02-07 DIAGNOSIS — Z6841 Body Mass Index (BMI) 40.0 and over, adult: Secondary | ICD-10-CM

## 2024-02-07 DIAGNOSIS — E1169 Type 2 diabetes mellitus with other specified complication: Secondary | ICD-10-CM

## 2024-02-07 DIAGNOSIS — E559 Vitamin D deficiency, unspecified: Secondary | ICD-10-CM

## 2024-02-07 DIAGNOSIS — E1165 Type 2 diabetes mellitus with hyperglycemia: Secondary | ICD-10-CM

## 2024-02-07 MED ORDER — BUSPIRONE HCL 7.5 MG PO TABS: 7.5000 mg | ORAL_TABLET | Freq: Two times a day (BID) | ORAL | 1 refills | Status: DC

## 2024-02-07 MED ORDER — VITAMIN D (ERGOCALCIFEROL) 1.25 MG (50000 UNIT) PO CAPS: 50000.0000 [IU] | ORAL_CAPSULE | ORAL | 0 refills | Status: AC

## 2024-02-07 MED ORDER — TOPIRAMATE 50 MG PO TABS: 50.0000 mg | ORAL_TABLET | Freq: Every day | ORAL | 0 refills | Status: AC

## 2024-02-07 MED ORDER — ATORVASTATIN CALCIUM 40 MG PO TABS: 40.0000 mg | ORAL_TABLET | Freq: Every day | ORAL | 3 refills | Status: AC

## 2024-02-07 MED ORDER — BUPROPION HCL ER (XL) 300 MG PO TB24: 300.0000 mg | ORAL_TABLET | Freq: Every day | ORAL | 1 refills | Status: AC

## 2024-02-07 NOTE — Assessment & Plan Note (Addendum)
 CGM reviewed: 30 day average 163, 71% in range, 23% high, 5% very high 14 day average 163, GMI 7.2, 71% in range, 23% very high, 5% very high 7 day average 164, 72% in range, 24% high, 4% very high 3 day average 153, 86% in range, 13% high  A1c done on 01/21/24 of 9.4 which is an improvement from 9.9. Taking 90 units of lantus  and sliding scale Novolog  insulin .  She is attending cooking classes and is meeting with a nutritionist for help with reading labels and grocery shopping to ensure she is getting nutritious food in.

## 2024-02-07 NOTE — Progress Notes (Unsigned)
 "  SUBJECTIVE:  Chief Complaint: Obesity  Interim History: Patient has been less stressed letting her kids take over their own responsibilities.  She got a support dog from a friend who passed away.  She is being more mindful of her food intake and has been making significant changes in her food choices.  She mentions she has changed how she is taking her coffee now and is often choosing black coffee with splenda now.    Tara Keller is here to discuss her progress with her obesity treatment plan. She is on the practicing portion control and making smarter food choices, such as increasing vegetables and decreasing simple carbohydrates and states she is following her eating plan approximately 89 % of the time. She states she is exercising 15 minutes 5 times per week.   OBJECTIVE: Visit Diagnoses: Problem List Items Addressed This Visit       Endocrine   Type 2 diabetes mellitus with hyperglycemia, with long-term current use of insulin  (HCC) - Primary   CGM reviewed: 30 day average 163, 71% in range, 23% high, 5% very high 14 day average 163, GMI 7.2, 71% in range, 23% very high, 5% very high 7 day average 164, 72% in range, 24% high, 4% very high 3 day average 153, 86% in range, 13% high  A1c done on 01/21/24 of 9.4 which is an improvement from 9.9. Taking 90 units of lantus  and sliding scale Novolog  insulin .  She is attending cooking classes and is meeting with a nutritionist for help with reading labels and grocery shopping to ensure she is getting nutritious food in.      Relevant Medications   atorvastatin  (LIPITOR) 40 MG tablet   Hyperlipidemia associated with type 2 diabetes mellitus (HCC)   Relevant Medications   atorvastatin  (LIPITOR) 40 MG tablet     Other   Morbid obesity (HCC)   Vitamin D  deficiency   Relevant Medications   Vitamin D , Ergocalciferol , (DRISDOL ) 1.25 MG (50000 UNIT) CAPS capsule   Other Visit Diagnoses       BMI 45.0-49.9, adult (HCC)            Vitals Temp: 98 F (36.7 C) BP: 110/69 Pulse Rate: 90 SpO2: 98 %   Anthropometric Measurements Height: 5' 6 (1.676 m) Weight: (!) 306 lb (138.8 kg) BMI (Calculated): 49.41 Weight at Last Visit: 302 lb Weight Lost Since Last Visit: 0 Weight Gained Since Last Visit: 4 Starting Weight: 311 lb Total Weight Loss (lbs): 5 lb (2.268 kg)   Body Composition  Body Fat %: 54.7 % Fat Mass (lbs): 167.8 lbs Muscle Mass (lbs): 131.8 lbs Total Body Water  (lbs): 98.2 lbs Visceral Fat Rating : 21   Other Clinical Data Today's Visit #: 15 Starting Date: 11/22/22 Comments: [facility]     ASSESSMENT AND PLAN: Assessment & Plan Type 2 diabetes mellitus with hyperglycemia, with long-term current use of insulin  (HCC) CGM reviewed: 30 day average 163, 71% in range, 23% high, 5% very high 14 day average 163, GMI 7.2, 71% in range, 23% very high, 5% very high 7 day average 164, 72% in range, 24% high, 4% very high 3 day average 153, 86% in range, 13% high  A1c done on 01/21/24 of 9.4 which is an improvement from 9.9. Taking 90 units of lantus  and sliding scale Novolog  insulin .  She is attending cooking classes and is meeting with a nutritionist for help with reading labels and grocery shopping to ensure she is getting nutritious food in. Hyperlipidemia associated  with type 2 diabetes mellitus (HCC)  Vitamin D  deficiency  BMI 45.0-49.9, adult (HCC)  Obesity with starting BMI of 50.3    Diet: Nou is currently in the action stage of change. As such, her goal is to continue with weight loss efforts and has agreed to {MWMwtlossportion/plan2:23431}.   Exercise:  {MWM Exercise Recommendations:210964029}  Behavior Modification:  We discussed the following Behavioral Modification Strategies today: {HWW Behavior Modification:210964008}. We discussed various medication options to help Taura with her weight loss efforts and we both agreed to ***.  No follow-ups on file.   She was  informed of the importance of frequent follow up visits to maximize her success with intensive lifestyle modifications for her multiple health conditions.  Attestation Statements:   Reviewed by clinician on day of visit: allergies, medications, problem list, medical history, surgical history, family history, social history, and previous encounter notes.   Time spent on visit including pre-visit chart review and post-visit care and charting was *** minutes  Adelita Cho, MD "

## 2024-02-13 ENCOUNTER — Ambulatory Visit: Payer: MEDICAID | Admitting: Physical Therapy

## 2024-02-14 ENCOUNTER — Ambulatory Visit: Payer: MEDICAID | Admitting: Physical Therapy

## 2024-02-18 ENCOUNTER — Ambulatory Visit: Payer: MEDICAID | Admitting: Physical Therapy

## 2024-02-20 ENCOUNTER — Ambulatory Visit: Payer: MEDICAID | Admitting: Physical Therapy

## 2024-02-25 ENCOUNTER — Ambulatory Visit: Payer: MEDICAID | Admitting: Physical Therapy

## 2024-02-27 ENCOUNTER — Ambulatory Visit: Payer: MEDICAID | Admitting: Physical Therapy

## 2024-03-03 ENCOUNTER — Ambulatory Visit: Payer: MEDICAID | Admitting: Physical Therapy

## 2024-03-05 ENCOUNTER — Ambulatory Visit: Payer: MEDICAID | Admitting: Physical Therapy

## 2024-03-05 ENCOUNTER — Ambulatory Visit: Payer: Self-pay | Admitting: Family Medicine

## 2024-03-06 ENCOUNTER — Ambulatory Visit (INDEPENDENT_AMBULATORY_CARE_PROVIDER_SITE_OTHER): Payer: MEDICAID | Admitting: Family Medicine

## 2024-03-10 ENCOUNTER — Ambulatory Visit: Payer: MEDICAID | Admitting: Physical Therapy

## 2024-03-12 ENCOUNTER — Ambulatory Visit: Payer: MEDICAID | Admitting: Physical Therapy

## 2024-03-17 ENCOUNTER — Ambulatory Visit: Payer: MEDICAID | Admitting: Physical Therapy

## 2024-03-19 ENCOUNTER — Ambulatory Visit: Payer: MEDICAID | Admitting: Physical Therapy

## 2024-03-24 ENCOUNTER — Ambulatory Visit: Payer: MEDICAID | Admitting: Physical Therapy

## 2024-03-26 ENCOUNTER — Ambulatory Visit: Payer: MEDICAID | Admitting: Physical Therapy

## 2024-03-27 ENCOUNTER — Encounter: Payer: MEDICAID | Admitting: Dietician

## 2024-03-31 ENCOUNTER — Ambulatory Visit: Payer: MEDICAID | Admitting: Physical Therapy

## 2024-04-02 ENCOUNTER — Ambulatory Visit: Payer: MEDICAID | Admitting: Physical Therapy

## 2024-04-07 ENCOUNTER — Ambulatory Visit: Payer: MEDICAID | Admitting: Physical Therapy

## 2024-04-09 ENCOUNTER — Ambulatory Visit: Payer: MEDICAID | Admitting: Physical Therapy

## 2024-04-14 ENCOUNTER — Ambulatory Visit: Payer: MEDICAID | Admitting: Physical Therapy

## 2024-04-14 ENCOUNTER — Ambulatory Visit: Payer: MEDICAID | Admitting: Internal Medicine

## 2024-04-14 ENCOUNTER — Ambulatory Visit: Admitting: Podiatry

## 2024-04-16 ENCOUNTER — Ambulatory Visit: Payer: MEDICAID | Admitting: Physical Therapy

## 2024-12-22 ENCOUNTER — Ambulatory Visit: Admitting: Adult Health
